# Patient Record
Sex: Male | Born: 1945 | Race: Black or African American | Hispanic: No | Marital: Single | Smoking: Former smoker
Health system: Southern US, Community
[De-identification: ages and names within clinical notes are randomized; demographics above are authoritative.]

## PROBLEM LIST (undated history)

## (undated) DIAGNOSIS — F99 Mental disorder, not otherwise specified: Secondary | ICD-10-CM

## (undated) DIAGNOSIS — G629 Polyneuropathy, unspecified: Secondary | ICD-10-CM

## (undated) DIAGNOSIS — F0390 Unspecified dementia without behavioral disturbance: Secondary | ICD-10-CM

## (undated) DIAGNOSIS — M6282 Rhabdomyolysis: Secondary | ICD-10-CM

## (undated) DIAGNOSIS — M4800 Spinal stenosis, site unspecified: Secondary | ICD-10-CM

## (undated) DIAGNOSIS — C61 Malignant neoplasm of prostate: Secondary | ICD-10-CM

## (undated) DIAGNOSIS — R600 Localized edema: Secondary | ICD-10-CM

## (undated) DIAGNOSIS — I10 Essential (primary) hypertension: Secondary | ICD-10-CM

## (undated) DIAGNOSIS — K529 Noninfective gastroenteritis and colitis, unspecified: Secondary | ICD-10-CM

## (undated) DIAGNOSIS — N390 Urinary tract infection, site not specified: Secondary | ICD-10-CM

## (undated) DIAGNOSIS — K219 Gastro-esophageal reflux disease without esophagitis: Secondary | ICD-10-CM

## (undated) HISTORY — PX: PROSTATECTOMY: SHX69

## (undated) HISTORY — PX: KNEE ARTHROSCOPY: SHX127

## (undated) HISTORY — PX: LAMINECTOMY: SHX219

## (undated) HISTORY — DX: Noninfective gastroenteritis and colitis, unspecified: K52.9

## (undated) HISTORY — DX: Malignant neoplasm of prostate: C61

## (undated) HISTORY — DX: Spinal stenosis, site unspecified: M48.00

---

## 1999-10-07 ENCOUNTER — Encounter: Payer: Self-pay | Admitting: Emergency Medicine

## 1999-10-07 ENCOUNTER — Emergency Department (HOSPITAL_COMMUNITY): Admission: EM | Admit: 1999-10-07 | Discharge: 1999-10-07 | Payer: Self-pay | Admitting: Emergency Medicine

## 2000-05-23 ENCOUNTER — Ambulatory Visit (HOSPITAL_COMMUNITY): Admission: RE | Admit: 2000-05-23 | Discharge: 2000-05-23 | Payer: Self-pay | Admitting: *Deleted

## 2000-05-23 ENCOUNTER — Encounter (INDEPENDENT_AMBULATORY_CARE_PROVIDER_SITE_OTHER): Payer: Self-pay | Admitting: Specialist

## 2003-07-23 ENCOUNTER — Encounter: Admission: RE | Admit: 2003-07-23 | Discharge: 2003-07-23 | Payer: Self-pay | Admitting: Family Medicine

## 2003-07-23 ENCOUNTER — Encounter: Payer: Self-pay | Admitting: Family Medicine

## 2004-09-05 ENCOUNTER — Encounter: Admission: RE | Admit: 2004-09-05 | Discharge: 2004-09-05 | Payer: Self-pay | Admitting: Specialist

## 2004-09-23 ENCOUNTER — Inpatient Hospital Stay (HOSPITAL_COMMUNITY): Admission: RE | Admit: 2004-09-23 | Discharge: 2004-09-26 | Payer: Self-pay | Admitting: Specialist

## 2005-11-16 ENCOUNTER — Encounter: Admission: RE | Admit: 2005-11-16 | Discharge: 2005-11-16 | Payer: Self-pay | Admitting: Specialist

## 2005-12-25 ENCOUNTER — Encounter: Admission: RE | Admit: 2005-12-25 | Discharge: 2005-12-25 | Payer: Self-pay | Admitting: Specialist

## 2006-01-31 ENCOUNTER — Encounter: Admission: RE | Admit: 2006-01-31 | Discharge: 2006-01-31 | Payer: Self-pay | Admitting: Specialist

## 2006-11-21 ENCOUNTER — Encounter
Admission: RE | Admit: 2006-11-21 | Discharge: 2006-11-21 | Payer: Self-pay | Admitting: Physical Medicine and Rehabilitation

## 2007-02-14 ENCOUNTER — Emergency Department (HOSPITAL_COMMUNITY): Admission: EM | Admit: 2007-02-14 | Discharge: 2007-02-14 | Payer: Self-pay | Admitting: Emergency Medicine

## 2007-07-07 ENCOUNTER — Ambulatory Visit: Payer: Self-pay | Admitting: Family Medicine

## 2007-08-08 ENCOUNTER — Ambulatory Visit: Payer: Self-pay | Admitting: Family Medicine

## 2009-05-19 ENCOUNTER — Ambulatory Visit: Payer: Self-pay | Admitting: Family Medicine

## 2011-03-02 LAB — HM COLONOSCOPY: HM Colonoscopy: NEGATIVE

## 2011-03-30 NOTE — Op Note (Signed)
[place]  Patient:    Evan Charles, Evan Charles                       MRN: [id number] Proc. Date: 05/23/00 Attending:  Sharyn Dross., M.D. CC:         Ronnald Nian, M.D.                           Operative Report  PREOPERATIVE DIAGNOSIS:  Blood in stools.  POSTOPERATIVE DIAGNOSIS:  Acute colitis diffusely throughout the entire colon, more intense on the left side.  PROCEDURE: Colonoscopy and biopsies.  MEDICATIONS:  Demerol 80 mg, IV Versed 5 mg IV over 10 minute period of time.   INSTRUMENT:  Olympus video pancolonoscope.  ENDOSCOPIST:  Sharyn Dross., M.D.  INDICATIONS:  This pleasant, 65 year old gentleman was brought in for evaluation because of blood in stools.  He was evaluated in 1996 and was found to have mild colitis that was present.  He was treated with medication and subsequently had not seen him until recently when his symptoms have recurred. He denies any previous symptoms of any rectal bleeding or discomforts that was noted.  It is no associated with diarrhea or abdominal pain.  OBJECTIVE FINDINGS:  GENERAL:  He is a pleasant gentleman in no distress.  VITAL SIGNS:  Stable.  HEENT: Anicteric.  NECK:  Supple.  LUNGS:  Clear.  HEART:  Regular rate and rhythm without heaves, thrills, murmurs, or gallops.  ABDOMEN:  Soft.  No tenderness.  No hepatosplenomegaly. EXTREMITIES:  Within normal limits  PLAN:  Proceed with colonoscopic examination.  INFORMED CONSENT:  The patient was advised of the procedure, indications, and risks involved.  The patient has agreed to have the procedure performed.  A video was reviewed and consent form obtained.  PREOPERATIVE PREPARATION:  The patient was brought to the endoscopy unit and IV sedating medication was started.  Monitors were placed on the patient to monitor the patients vital signs and oxygen saturation.  Nasal oxygen at 2 liters per minute was used, and after adequate  sedation was performed, the procedure was begun.  BOWEL PREP:  The patient was given GoLYTELY and Reglan as a bowel prep.  The patient tolerated the prep well without any complications.  The quality of the prep was excellent.  DESCRIPTION OF PROCEDURE:  The instrument was advanced with patient in left lateral decubitus position to approximately 90 cm proximal to the colon to the cecum.  This was confirmed by palpation, transillumination, and visualization of the ileocecal valve.  There appeared to be no gross abnormalities such as masses, polyps, or stricture lesions appreciated.  The vascular pattern appeared to be well within normal limits throughout the entire colon.  The mucosal pattern showed evidence of acute inflammation, diffuse from the rectosigmoid area all the way through to the cecal region at this time.  The increase in intensity of his process is located on the left side; i.e., rectosigmoid and descending colon that was appreciated at this time.  The less intense was noted from the transverse to the ascending colon region.  Random biopsies were taken throughout the entire area at this time.  There was no increased tortuosity of the colon nor any evidence of stricture formation or inflammatory process that was appreciated at this time. There was no evidence of internal nor external hemorrhoids upon exiting from the area.  The patient tolerated the procedure  well without any difficulties or complications.  TREATMENT: 1. I am going to start the patient with ______ 500 mg q.d. and increase it    q.i.d. at this time. 2. Will recommend a re-evaluation of the patient in six weeks, and depending    upon these responses will determine the course of therapy.  LEVEL OF DIFFICULTY:  1 to 2/5.  RECOMMENDATIONS:  Will continue the use of the standard colonoscope to evaluate the patient. DD:  05/23/00 TD:  05/23/00 Job: [id number]

## 2011-03-30 NOTE — Discharge Summary (Signed)
NAMEERMAN, THUM              ACCOUNT NO.:  [id number]   MEDICAL RECORD NO.:  [id number]          PATIENT TYPE:  INP   LOCATION:  0455                         FACILITY:  [facility]   PHYSICIAN:  Jene Every, M.D.    DATE OF BIRTH:  June 29, 1946   DATE OF ADMISSION:  09/22/2004  DATE OF DISCHARGE:  09/26/2004                                 DISCHARGE SUMMARY   ADMISSION DIAGNOSES:  Spinal stenosis, herniated nucleus pulposis L4-5.   DISCHARGE DIAGNOSES:  Spinal stenosis, herniated nucleus pulposis L4-5  status post bilateral decompression L4-5.   HISTORY:  Evan Charles is a 65 year old gentleman with presentation of  bilateral lower extremity pain with noted neurogenic claudication as well as  neurotension signs.  It was felt at that point the patient would benefit  from lumbar decompression.  The risks and benefits of the surgery were  discussed with the patient by Dr. Shelle Iron and he wished to proceed.   PROCEDURE:  The patient was taken to the OR on September 22, 2004 to undergo  bilateral decompression L4-5 and microdiskectomy.   SURGEON:  Dr. Jene Every.   ASSISTANT:  Georges Lynch. Darrelyn Hillock, M.D.   COMPLICATIONS:  None.   CONSULTATIONS:  PT and OT.   LABORATORY VALUES:  Preoperative hemoglobin and hematocrit showed a  hemoglobin of 14.6, hematocrit 43.6.  This was repeated x1 with a hemoglobin  of 12.3, hematocrit 37.6.  Preoperative urinalysis was negative.  No  preoperative chest x-ray or EKG are available in the chart.   HOSPITAL COURSE:  The patient was admitted, taken to the OR and underwent  the above-stated procedure without significant difficulty.  He was then  transferred to the PACU and then to the orthopedic floor for continued  postoperative care.  Postoperatively, the patient had significant trouble  with pain control.  He was however, very reluctant to get out of bed and  refused therapy several times.  The patient did note some improvement in  paresthesias in  the lower extremities.  The pain medication was changed.  The patient was converted to p.o. medications.  We did obtain a lumbar  corset for him, arranged home health therapy.  The patient was ambulating  significantly better.  It is felt at this point he could be discharged home.   DISCHARGE INSTRUCTIONS:  The patient was given Percocet 1-2 p.o. q.4-6h.  p.r.n. pain.  OxyContin 20 mg one p.o. b.i.d.  Robaxin one every six hours  p.r.n. muscle spasm.  Activity:  Back precautions.  He is to wear his lumbar  corset as needed.  Walk as tolerated.  Change his dressing daily.  It is  okay for him to shower.  He will also use his walker when up and about.  He  is to follow back up with Dr. Shelle Iron in approximately 7-10 days.   CONDITION ON DISCHARGE:  Stable.   DIET:  As tolerated.   FINAL DIAGNOSIS:  Status post bilateral decompression L4-5.     Chri   CS/MEDQ  D:  11/09/2004  T:  11/09/2004  Job:  [id number]

## 2011-03-30 NOTE — Op Note (Signed)
Evan Charles, Evan Charles              ACCOUNT NO.:  [id number]   MEDICAL RECORD NO.:  [id number]          PATIENT TYPE:  AMB   LOCATION:  DAY                          FACILITY:  [facility]   PHYSICIAN:  Jene Every, M.D.    DATE OF BIRTH:  May 13, 1946   DATE OF PROCEDURE:  DATE OF DISCHARGE:                                 OPERATIVE REPORT   PREOPERATIVE DIAGNOSIS:  Herniated nucleus pulposus, spinal stenosis, L4-5.   POSTOPERATIVE DIAGNOSIS:  Herniated nucleus pulposus, spinal stenosis, L4-5.   PROCEDURE PERFORMED:  Bilateral hemilaminotomy, lateral recess  decompression, partial medial hemifacetectomy, microdiskectomy.   SURGEON:  Jene Every, M.D.   ASSISTANT:  Georges Lynch. Gioffre, M.D.   BRIEF HISTORY/INDICATIONS:  A 65 year old male has had lower extremity  radiculopathy and neurogenic claudication secondary to spinal stenosis  secondary to central HNP at 4-5 facet hypertrophy and general listhesis of 4-  5, but no instability on flexion and extension after persistent symptoms,  predominantly in the right lower extremity, somewhat relieved with flexion,  no change in bowel or bladder function, near-complete block at 4-5 on  myelogram.  Operative intervention was indicated for the decompression of 4-  5, pain relief, and to prevent neurological progression.  He had a  __________ EHL weakness on the right, in comparison to the left.  I also  discussed operative fusion.  He had some mild degenerative changes at the  adjacent levels.  Myelogram indicating possible contact of the L3 nerve  root.  He had no L3 nerve root discomfort on the left.   We discussed decompression, decompression plus fusion in detail in the  office in the previous visit.  Patient did not want to proceed with any  surgical concomitant fusion, citing multiple reasons for the retained  hardware; therefore, discussed decompression bilateral at 4-5 but indicating  may require effusion in the future.  May experience  recurrent disk  herniation, damage to vascular structures, CSF leak, __________, adjacent  segment disease, anesthetic complications, DVT, PE.  He understands that  this will only address leg pain and not back pain.  He wishes to proceed  with the procedure.  He was counseled, and this was discussed again  preoperatively of the risks and benefits as well as decompression versus  fusion, again, he declined the fusion and also had indicated decompression  and instrumentation and fusion had long-term results.  He understood, but  again, declined the concomitant effusion.   TECHNIQUE:  With the patient in supine position after adequate induction of  general anesthesia and 1 gm of Kefzol, patient was placed prone on the  [place] frame.  All bony prominences were well-padded.  An 18-gauge needle  was utilized to localize the 4-5 interspace, confirmed with x-ray.  An  incision was made from the spinous process of 4 to below the spinous process  of 5.  Subcutaneous tissue was dissected with electrocautery and utilized to  achieve hemostasis.  Dorsal lumbar fascia was identified and divided  on a  line of the interspinous ligament at 4-5.  I felt preserving this__________  ligament, given the central disk  herniation and bilateral hemilaminectomy  would be preferable in terms of reducing the disease of further listhesis.  Turning our attention first towards the right side, hemilaminectomy on the  caudad edge of 4 was performed with a 2-3 mm Kerrison.  The ligamentum  flavum was detached from the cephalad edge of 5 with a straight curette and  a hemilaminectomy.  The cephalad edge of 5 was performed with 2 and then a 3  mm Kerrison.  Foraminotomy of L5 was performed.  We decompressed the lateral  recess, utilizing a decompression of the lateral aspect of the facet.  Ligamentum flavum then was removed from the interspace.  Palpated the large  disk herniation noted on through the right.  At this point  in time, after  operating microscope had been draped in the surgical field, we moved to the  contralateral side.  We performed hemilaminectomy of the caudad edge of 4  and the cephalad edge of 5, detaching the ligamentum flavum.  Removed the  ligamentum flavum from the interspace and performed a foraminotomy of L5.  After the ligamentum was removed from the interspace, we decompressed the  lateral recess again, performing a partial medial hemifacetectomy to gain  lateral access.  No traction was placed upon the thecal sac.  After removing  the ligamentum flavum bilaterally, we performed hemilaminectomy back on the  right, and the disk space was noted to be ruptured.  A copious portion of  disk material was removed from the disk space on the right.  I removed the  contralateral side and performed an annulotomy on the left and again  performed the diskectomy with a straight pituitary.  This was further  mobilized with an Epstein and a nerve hook, again utilizing minimal-to-no  retraction but protecting the neural elements with a Penfield 4.  Between  each successive use of the pituitary, we released the neural elements.  After performing the diskectomy of herniated material on the left, again  returned to the right and were able to mobilize further extruded fragments.  This was a counter-combination of the nerve hook.  An hockey-stick probe and  an Epstein, performing a full diskectomy of herniated material.  Following  this, we had utilized bipolar electrocautery to achieve hemostasis of  epidural venous plexus.  We used bone wax on the facets.  Then reinspected  both sides.  A hockey-stick probe placed at the foramen of 4-5 and found to  be widely patent.  We had performed foraminotomies of L4 of the ligamentum  flavum.  Hockey-stick probe placed at 3-5 and found to be patent.  The disk space was copiously irrigated with antibiotic irrigation and inspected but  no CSF leakage or active  bleeding.  The 5 root is freely mobile.  There is  no pressure on the thecal sac.  Placed a thrombin-soaked Gelfoam on the  hemilaminectomy defects.  Inspected the paraspinous musculature.  Electrocautery was utilized to achieve hemostasis.  No active bleeding.  Placed a Hemovac and brought it out through a lateral stab wound in the  skin.  Then repaired the dorsal lumbar fascia with #1 Vicryl interrupted  figure-of-eight sutures.  The subcutaneous tissue was reapproximated with 2-  0 Vicryl simple sutures.  The skin was reapproximated with staples.  The  wound was dressed sterilely and placed supine on the hospital bed and  extubated without difficulty and transported to the recovery room in  satisfactory condition.  Patient tolerated the procedure well.  No  complications.  Blood loss was 25 cc.     Trey Paula   JB/MEDQ  D:  09/22/2004  T:  09/22/2004  Job:  [id number]

## 2011-03-30 NOTE — H&P (Signed)
Evan Charles, Evan Charles              ACCOUNT NO.:  [id number]   MEDICAL RECORD NO.:  [id number]          PATIENT TYPE:  INP   LOCATION:  0455                         FACILITY:  [facility]   PHYSICIAN:  Jene Every, M.D.    DATE OF BIRTH:  August 23, 1946   DATE OF ADMISSION:  09/22/2004  DATE OF DISCHARGE:  09/26/2004                                HISTORY & PHYSICAL   CHIEF COMPLAINT:  Lower extremity pain.   HISTORY:  Evan Charles is a 65 year old gentleman with onset of lower  extremity pain and neurogenic claudication secondary to spinal stenosis  secondary to central HNP at L4-5 with facet hypertrophy.  The patient did  present with EHL weakness on the right in comparison to the left.  Myelogram  did indicate possible contact of the L3 nerve root.  It was felt at this  point, the patient would benefit from a lumbar decompression and  microdiskectomy at the L4-5 level.  The risks and benefits of the surgery  were discussed with the patient in detail, and he wished to proceed.   MEDICAL HISTORY:  Noncontributory.   CURRENT MEDICATIONS:  Darvocet, iron, and Advil.   ALLERGIES:  TOMATOES which cause a rash.   SOCIAL HISTORY:  The patient does not drink alcohol.  He smoked 1/2 pack of  cigarettes per day, quit several months ago.   REVIEW OF SYSTEMS:  GENERAL:  The patient denies any fever, chills, night  sweats, or bleeding tendencies.  CNS:  No blurred, double vision, seizure,  headache, or paralysis.  RESPIRATORY:  No shortness of breath, productive  cough, or hemoptysis.  CARDIOVASCULAR:  No chest pain, angina, orthopnea.  GU:  No dysuria or hematuria at discharge.  GI:  No nausea, vomiting,  diarrhea, constipation, or melenic stools.  MUSCULOSKELETAL:  Pertinents in  HPI.   PHYSICAL EXAMINATION:  This comes from the admission health history sheet.  GENERAL:  This is a well-developed, well-nourished 65 year old gentleman in  mild distress.  HEENT:  Atraumatic, normocephalic.  Pupils  equal, round, reactive to light.  NECK:  Supple, no lymphadenopathy.  CHEST:  Clear to auscultation bilaterally.  No rhonchi, wheezes, or rales.  BREASTS/GU:  Not examined.  Not pertinent to HPI.  HEART:  Regular rate and rhythm without murmurs, gallops, or rubs.  ABDOMEN:  Soft, nontender, nondistended.  Bowel sounds x 4.  EXTREMITIES:  The patient has positive straight leg raise bilaterally,  slight weakness in EHL function, 2+ __________ pulses, decreased sensation  along the L5 dermatome.   IMPRESSION:  Spinal stenosis with large herniated nucleated pulposus at L4-  5.   PLAN:  The patient will be admitted to [facility] to undergo  lumbar decompression and microdiskectomy at L4-5 level.     Chri   CS/MEDQ  D:  11/09/2004  T:  11/09/2004  Job:  [id number]

## 2011-05-14 ENCOUNTER — Encounter: Payer: Self-pay | Admitting: Family Medicine

## 2013-08-14 ENCOUNTER — Emergency Department (HOSPITAL_COMMUNITY)
Admission: EM | Admit: 2013-08-14 | Discharge: 2013-08-14 | Disposition: A | Discharge status: Home / Self Care | Payer: Medicare Other | Attending: Emergency Medicine | Admitting: Emergency Medicine

## 2013-08-14 ENCOUNTER — Encounter (HOSPITAL_COMMUNITY): Payer: Self-pay

## 2013-08-14 ENCOUNTER — Emergency Department (HOSPITAL_COMMUNITY): Payer: Medicare Other

## 2013-08-14 DIAGNOSIS — Z87891 Personal history of nicotine dependence: Secondary | ICD-10-CM | POA: Insufficient documentation

## 2013-08-14 DIAGNOSIS — Y9389 Activity, other specified: Secondary | ICD-10-CM | POA: Insufficient documentation

## 2013-08-14 DIAGNOSIS — Z8739 Personal history of other diseases of the musculoskeletal system and connective tissue: Secondary | ICD-10-CM | POA: Insufficient documentation

## 2013-08-14 DIAGNOSIS — R209 Unspecified disturbances of skin sensation: Secondary | ICD-10-CM | POA: Insufficient documentation

## 2013-08-14 DIAGNOSIS — Z8719 Personal history of other diseases of the digestive system: Secondary | ICD-10-CM | POA: Insufficient documentation

## 2013-08-14 DIAGNOSIS — Y92009 Unspecified place in unspecified non-institutional (private) residence as the place of occurrence of the external cause: Secondary | ICD-10-CM | POA: Insufficient documentation

## 2013-08-14 DIAGNOSIS — S0180XA Unspecified open wound of other part of head, initial encounter: Secondary | ICD-10-CM | POA: Insufficient documentation

## 2013-08-14 DIAGNOSIS — W06XXXA Fall from bed, initial encounter: Secondary | ICD-10-CM | POA: Insufficient documentation

## 2013-08-14 DIAGNOSIS — S0181XA Laceration without foreign body of other part of head, initial encounter: Secondary | ICD-10-CM

## 2013-08-14 DIAGNOSIS — Z8546 Personal history of malignant neoplasm of prostate: Secondary | ICD-10-CM | POA: Insufficient documentation

## 2013-08-14 MED ORDER — TETANUS-DIPHTH-ACELL PERTUSSIS 5-2.5-18.5 LF-MCG/0.5 IM SUSP
0.5000 mL | Freq: Once | INTRAMUSCULAR | Status: AC
  Administered 2013-08-14: 0.5 mL via INTRAMUSCULAR
  Filled 2013-08-14: qty 0.5

## 2013-08-14 MED ORDER — CLINDAMYCIN HCL 300 MG PO CAPS
300.0000 mg | ORAL_CAPSULE | Freq: Three times a day (TID) | ORAL | Status: DC
  Administered 2013-08-14: 300 mg via ORAL
  Filled 2013-08-14 (×3): qty 1

## 2013-08-14 MED ORDER — CLINDAMYCIN HCL 150 MG PO CAPS: 450.0000 mg | ORAL_CAPSULE | Freq: Three times a day (TID) | ORAL | Status: DC

## 2013-08-14 NOTE — ED Provider Notes (Signed)
CSN: [id number]     Arrival date & time 08/14/13  1141 History   First MD Initiated Contact with Patient 08/14/13 1149     Chief Complaint  Patient presents with  . Facial Laceration   (Consider location/radiation/quality/duration/timing/severity/associated sxs/prior Treatment) The history is provided by the patient. No language interpreter was used.  Evan Charles is a 67 y/o M with PMHx of prostate cancer, colitis, back pain presenting to the ED with facial laceration that occurred approximately 2:00-3:00 AM this morning. Patient reported that while he was sleeping he fell out of bed and hit is head on a metal rail. Patient reports that he noticed a laceration to the face, reported that he applied compressions 8 and bleeding controlled. Patient reported that he noticed left-sided numbness to the upper lip, cheek, nose region. Reported that there is a constant throbbing sensation to the area of the laceration with radiation to the left side the back. Reported that he took oxycodone, reports he has medication at home, reported that there is pain relief. Patient denied loss of consciousness, blood thinners, neck stiffness, blurred vision, sudden loss of vision, spots in vision, slurred speech, weakness, chest pain, shortness of breath, difficulty breathing, fainting. Patient reports he has not had a tetanus shot within 10 years. PCP none  Past Medical History  Diagnosis Date  . Back pain   . Colitis   . Prostate cancer    Past Surgical History  Procedure Laterality Date  . Prostatectomy    . Laminectomy     Family History  Problem Relation Age of Onset  . Cancer Brother   . Cancer Maternal Grandmother    History  Substance Use Topics  . Smoking status: Former Smoker    Types: Cigarettes  . Smokeless tobacco: Not on file  . Alcohol Use: No    Review of Systems  Eyes: Negative for visual disturbance.  Respiratory: Negative for chest tightness and shortness of breath.     Cardiovascular: Negative for chest pain.  Skin: Positive for wound (left side of the face).  Neurological: Positive for numbness. Negative for dizziness, syncope and weakness.  All other systems reviewed and are negative.    Allergies  Other  Home Medications   Current Outpatient Rx  Name  Route  Sig  Dispense  Refill  . docusate sodium (COLACE) 50 MG capsule   Oral   Take by mouth 3 (three) times daily as needed for constipation.         Marland Kitchen oxycodone (ROXICODONE) 30 MG immediate release tablet   Oral   Take 30 mg by mouth every 4 (four) hours as needed for pain.          . clindamycin (CLEOCIN) 150 MG capsule   Oral   Take 3 capsules (450 mg total) by mouth 3 (three) times daily.   90 capsule   0    BP 150/98  Pulse 104  Temp(Src) 97.8 F (36.6 C) (Oral)  Resp 18  Ht 5\' 11"  (1.803 m)  Wt 189 lb (85.73 kg)  BMI 26.37 kg/m2  SpO2 99% Physical Exam  Nursing note and vitals reviewed. Constitutional: He is oriented to person, place, and time. He appears well-developed and well-nourished. No distress.  HENT:  Head: Normocephalic.  Nose:    Mouth/Throat: Oropharynx is clear and moist. No oropharyngeal exudate.  Approximately 5.5 cm gaping facial laceration to the left side of the face, affecting the vermilion border. Laceration affects left nasolabial fold and left maxillary  region. No muscles appear to be involved. Negative involvement of buccal mucosa. Negative through-and-through laceration. bleeding controlled.   Negative damage to dentition identified.  Eyes: Conjunctivae and EOM are normal. Pupils are equal, round, and reactive to light. Right eye exhibits no discharge. Left eye exhibits no discharge.  Neck: Normal range of motion. Neck supple.  Cardiovascular: Normal rate, regular rhythm and normal heart sounds.  Exam reveals no friction rub.   No murmur heard. Pulmonary/Chest: Effort normal and breath sounds normal. No respiratory distress. He has no  wheezes. He has no rales.  Neurological: He is alert and oriented to person, place, and time. No cranial nerve deficit. He exhibits normal muscle tone. Coordination normal.  Cranial nerves III-XII grossly intact Sensation intact to the face with differentiation to sharp and dull touch   Skin: Skin is warm and dry. No rash noted. He is not diaphoretic. No erythema.  Please see HENT portion of exam  Psychiatric: He has a normal mood and affect. His behavior is normal. Thought content normal.    ED Course  Procedures (including critical care time)  12:44PM Dr. Blinda Leatherwood saw and assessed patient, recommended that Maxillofacial be consulted regarding closure of the laceration since it is complex.  12:52 PM Spoke with Dr. Jearld Fenton, maxillofacial surgery, discussed case. Dr. Jearld Fenton to come see patient.  1:31 PM Dr. Jearld Fenton, Maxillofacial surgeon, in room with patient.  Laceration closed by Dr. Jearld Fenton, maxillofacial surgeon. Dr. Jearld Fenton cleared patient for discharge. Recommended clindamycin and followup within one week for reevaluation as outpatient.   Labs Review Labs Reviewed - No data to display Imaging Review Ct Head Wo Contrast  08/14/2013   CLINICAL DATA:  Fall from bed. Trauma to face.  EXAM: CT HEAD WITHOUT CONTRAST  TECHNIQUE: Contiguous axial images were obtained from the base of the skull through the vertex without intravenous contrast.  COMPARISON:  None.  FINDINGS: A prominent soft tissue laceration is evident over the left maxilla. There is no underlying fracture. The nasal bones are intact. The globes orbits are intact. The paranasal sinuses are unremarkable.  No acute cortical infarct, hemorrhage, or mass lesion is present. The ventricles are of normal size. No significant extra-axial fluid collections are present.  IMPRESSION: 1. Prominent soft tissue laceration over the left maxilla. 2. No underlying fracture. 3. Normal CT appearance of the brain for age.   Electronically Signed   By: Gennette Pac M.D.   On: 08/14/2013 13:40    MDM   1. Facial laceration, initial encounter    Patient presenting to emergency department with laceration to the left of the face that occurred this morning at approximately 2:00-3:00 AM. Denied dizziness, loss of sensation, blood thinners, loss of consciousness. Alert and oriented. Approximately 5.5 cm gaping laceration to the left side of her face, complex, affecting the vermilion border, left nasolabial fold and left axillary region. No muscles appear to be involved. Cranial nerves III through XII grossly intact. Sensation intact to face with differentiation to sharp and dull touch. Negative slurring of speech. Negative facial drooping identified. Negative through and through laceration. Bleeding controlled. CT negative for intracranial bleeds. He has negative underlying fractures identified. Tetanus shot given to the patient in ED setting. Maxillofacial surgeon, Dr. Jearld Fenton, closed wound. Dr. Jearld Fenton cleared patient for discharge. Patient stable, afebrile. Discharged patient with clindamycin. Discussed with patient to followup with Dr. Jearld Fenton within one week. Discussed with patient proper wound cleaning. Discussed with patient to monitor symptoms and if symptoms are to worsen  or change report back to emergency department gastric return structures given. Educated patient on signs and symptoms of wound infection. Discussed with patient that if he is to have a laceration he is to come to emergency department immediately, educated patient that waiting with a laceration can lead to further infection. Patient understood. Patient agreed to plan of care, understood, all questions answered.    Raymon Mutton, PA-C 08/16/13 [phone]

## 2013-08-14 NOTE — ED Notes (Signed)
Pt states he was dreaming and fell out of bed last night and when rising hit his face on the bed rail. Presents with large laceration to left side of face above the upper lip and beside the nose.

## 2013-08-14 NOTE — Consult Note (Signed)
Reason for Consult: Facial laceration Referring Physician: Emergency room  Evan Charles is an 67 y.o. male.  HPI: 67 year old who fell yesterday and hit his left face with a laceration sustained. He failed to come in as he thought he could take care of it himself. He has had no drainage. He has no nasal obstruction. No dental injury. His CT scan did not show any evidence of fractures.  Past Medical History  Diagnosis Date  . Back pain   . Colitis   . Prostate cancer     Past Surgical History  Procedure Laterality Date  . Prostatectomy    . Laminectomy      Family History  Problem Relation Age of Onset  . Cancer Brother   . Cancer Maternal Grandmother     Social History:  reports that he has quit smoking. His smoking use included Cigarettes. He smoked 0.00 packs per day. He does not have any smokeless tobacco history on file. He reports that he does not drink alcohol or use illicit drugs.  Allergies:  Allergies  Allergen Reactions  . Other Rash    Tomatoes causes a rash    Medications: I have reviewed the patient's current medications.  No results found for this or any previous visit (from the past 48 hour(s)).  Ct Head Wo Contrast  08/14/2013   CLINICAL DATA:  Fall from bed. Trauma to face.  EXAM: CT HEAD WITHOUT CONTRAST  TECHNIQUE: Contiguous axial images were obtained from the base of the skull through the vertex without intravenous contrast.  COMPARISON:  None.  FINDINGS: A prominent soft tissue laceration is evident over the left maxilla. There is no underlying fracture. The nasal bones are intact. The globes orbits are intact. The paranasal sinuses are unremarkable.  No acute cortical infarct, hemorrhage, or mass lesion is present. The ventricles are of normal size. No significant extra-axial fluid collections are present.  IMPRESSION: 1. Prominent soft tissue laceration over the left maxilla. 2. No underlying fracture. 3. Normal CT appearance of the brain for age.    Electronically Signed   By: Gennette Pac M.D.   On: 08/14/2013 13:40    Review of Systems  Constitutional: Negative.   HENT: Negative.   Eyes: Negative.   Respiratory: Negative.   Cardiovascular: Negative.   Skin: Negative.    Blood pressure 150/98, pulse 104, temperature 97.8 F (36.6 C), temperature source Oral, resp. rate 18, height 5\' 11"  (1.803 m), weight 85.73 kg (189 lb), SpO2 99.00%. Physical Exam  Constitutional: He appears well-developed.  HENT:  Awake and alert. The nose internally is clear. There is a 4 cm laceration extending from his upper philtrum to the left lateral nasolabial fold. It is extending just below the nasal sill and deep. It does not appear there is a mucosal disruption. The wound was injected with 1% lidocaine with 1 100,000 epinephrine. It was irrigated with Betadine and then saline. It was closed with interrupted 4-0 chromic to approximate the deep structures and muscle. The skin was closed with interrupted 4-0 nylon and a running lateral 4-0 nylon. He tolerated this procedure well. There was good hemostasis.  Eyes: Conjunctivae and EOM are normal. Pupils are equal, round, and reactive to light.  Neck: Normal range of motion.    Assessment/Plan: Facial laceration-he understands that there is a risk of infection closing this almost 24 hours later. In lieu of the degree of laceration and extension it was worth closing despite this risk. I will start him on  clindamycin he'll take 3 times a day. He will followup in one week. Wound instructions were given.  Suzanna Obey 08/14/2013, 2:00 PM

## 2013-08-17 NOTE — ED Provider Notes (Signed)
Medical screening examination/treatment/procedure(s) were conducted as a shared visit with non-physician practitioner(s) and myself.  I personally evaluated the patient during the encounter.  Patient presents to the ER for evaluation of facial laceration. Patient has an extensive, complex laceration involving Vermillion border, nasolabial fold and malar cheek area. Complexity of the wound required consultation by facial trauma.  Gilda Crease, MD 08/17/13 1324

## 2013-12-02 ENCOUNTER — Ambulatory Visit: Payer: No Typology Code available for payment source | Attending: Physician Assistant | Admitting: Physical Therapy

## 2013-12-02 DIAGNOSIS — M129 Arthropathy, unspecified: Secondary | ICD-10-CM | POA: Insufficient documentation

## 2013-12-02 DIAGNOSIS — IMO0001 Reserved for inherently not codable concepts without codable children: Secondary | ICD-10-CM | POA: Diagnosis not present

## 2013-12-02 DIAGNOSIS — Z96659 Presence of unspecified artificial knee joint: Secondary | ICD-10-CM | POA: Insufficient documentation

## 2013-12-02 DIAGNOSIS — M25569 Pain in unspecified knee: Secondary | ICD-10-CM | POA: Diagnosis not present

## 2013-12-02 DIAGNOSIS — M25669 Stiffness of unspecified knee, not elsewhere classified: Secondary | ICD-10-CM | POA: Insufficient documentation

## 2013-12-02 DIAGNOSIS — M549 Dorsalgia, unspecified: Secondary | ICD-10-CM | POA: Diagnosis not present

## 2013-12-02 DIAGNOSIS — R609 Edema, unspecified: Secondary | ICD-10-CM | POA: Insufficient documentation

## 2013-12-04 ENCOUNTER — Ambulatory Visit: Payer: No Typology Code available for payment source | Admitting: Physical Therapy

## 2013-12-04 DIAGNOSIS — IMO0001 Reserved for inherently not codable concepts without codable children: Secondary | ICD-10-CM | POA: Diagnosis not present

## 2013-12-07 ENCOUNTER — Ambulatory Visit: Payer: No Typology Code available for payment source | Admitting: Physical Therapy

## 2013-12-07 DIAGNOSIS — IMO0001 Reserved for inherently not codable concepts without codable children: Secondary | ICD-10-CM | POA: Diagnosis not present

## 2013-12-10 ENCOUNTER — Ambulatory Visit: Payer: No Typology Code available for payment source | Admitting: Physical Therapy

## 2013-12-10 DIAGNOSIS — IMO0001 Reserved for inherently not codable concepts without codable children: Secondary | ICD-10-CM | POA: Diagnosis not present

## 2013-12-15 ENCOUNTER — Ambulatory Visit: Payer: Medicare Other | Attending: Physician Assistant | Admitting: Physical Therapy

## 2013-12-15 DIAGNOSIS — M129 Arthropathy, unspecified: Secondary | ICD-10-CM | POA: Insufficient documentation

## 2013-12-15 DIAGNOSIS — IMO0001 Reserved for inherently not codable concepts without codable children: Secondary | ICD-10-CM | POA: Insufficient documentation

## 2013-12-15 DIAGNOSIS — M25669 Stiffness of unspecified knee, not elsewhere classified: Secondary | ICD-10-CM | POA: Insufficient documentation

## 2013-12-15 DIAGNOSIS — R609 Edema, unspecified: Secondary | ICD-10-CM | POA: Insufficient documentation

## 2013-12-15 DIAGNOSIS — Z96659 Presence of unspecified artificial knee joint: Secondary | ICD-10-CM | POA: Insufficient documentation

## 2013-12-15 DIAGNOSIS — M25569 Pain in unspecified knee: Secondary | ICD-10-CM | POA: Insufficient documentation

## 2013-12-15 DIAGNOSIS — M549 Dorsalgia, unspecified: Secondary | ICD-10-CM | POA: Insufficient documentation

## 2013-12-17 ENCOUNTER — Ambulatory Visit: Payer: Medicare Other | Admitting: Physical Therapy

## 2013-12-17 DIAGNOSIS — M549 Dorsalgia, unspecified: Secondary | ICD-10-CM | POA: Diagnosis not present

## 2013-12-17 DIAGNOSIS — M129 Arthropathy, unspecified: Secondary | ICD-10-CM | POA: Diagnosis not present

## 2013-12-17 DIAGNOSIS — Z96659 Presence of unspecified artificial knee joint: Secondary | ICD-10-CM | POA: Diagnosis not present

## 2013-12-17 DIAGNOSIS — IMO0001 Reserved for inherently not codable concepts without codable children: Secondary | ICD-10-CM | POA: Diagnosis not present

## 2013-12-17 DIAGNOSIS — R609 Edema, unspecified: Secondary | ICD-10-CM | POA: Diagnosis not present

## 2013-12-17 DIAGNOSIS — M25569 Pain in unspecified knee: Secondary | ICD-10-CM | POA: Diagnosis not present

## 2013-12-17 DIAGNOSIS — M25669 Stiffness of unspecified knee, not elsewhere classified: Secondary | ICD-10-CM | POA: Diagnosis not present

## 2013-12-22 ENCOUNTER — Ambulatory Visit: Payer: Medicare Other | Admitting: Physical Therapy

## 2013-12-22 DIAGNOSIS — IMO0001 Reserved for inherently not codable concepts without codable children: Secondary | ICD-10-CM | POA: Diagnosis not present

## 2013-12-24 ENCOUNTER — Ambulatory Visit: Payer: Medicare Other | Admitting: Physical Therapy

## 2013-12-24 DIAGNOSIS — IMO0001 Reserved for inherently not codable concepts without codable children: Secondary | ICD-10-CM | POA: Diagnosis not present

## 2013-12-28 ENCOUNTER — Ambulatory Visit: Payer: Medicare Other | Admitting: Physical Therapy

## 2013-12-30 ENCOUNTER — Ambulatory Visit: Payer: Medicare Other | Admitting: Physical Therapy

## 2013-12-30 DIAGNOSIS — IMO0001 Reserved for inherently not codable concepts without codable children: Secondary | ICD-10-CM | POA: Diagnosis not present

## 2014-01-05 ENCOUNTER — Ambulatory Visit: Payer: Medicare Other | Admitting: Physical Therapy

## 2014-01-05 DIAGNOSIS — IMO0001 Reserved for inherently not codable concepts without codable children: Secondary | ICD-10-CM | POA: Diagnosis not present

## 2014-01-13 ENCOUNTER — Ambulatory Visit: Payer: Medicare Other | Attending: Physician Assistant | Admitting: Physical Therapy

## 2014-01-13 DIAGNOSIS — M25569 Pain in unspecified knee: Secondary | ICD-10-CM | POA: Diagnosis not present

## 2014-01-13 DIAGNOSIS — IMO0001 Reserved for inherently not codable concepts without codable children: Secondary | ICD-10-CM | POA: Diagnosis present

## 2014-01-13 DIAGNOSIS — Z96659 Presence of unspecified artificial knee joint: Secondary | ICD-10-CM | POA: Diagnosis not present

## 2014-01-13 DIAGNOSIS — M549 Dorsalgia, unspecified: Secondary | ICD-10-CM | POA: Insufficient documentation

## 2014-01-13 DIAGNOSIS — R609 Edema, unspecified: Secondary | ICD-10-CM | POA: Diagnosis not present

## 2014-01-13 DIAGNOSIS — M25669 Stiffness of unspecified knee, not elsewhere classified: Secondary | ICD-10-CM | POA: Insufficient documentation

## 2014-01-13 DIAGNOSIS — M129 Arthropathy, unspecified: Secondary | ICD-10-CM | POA: Insufficient documentation

## 2014-01-27 ENCOUNTER — Ambulatory Visit: Payer: Medicare Other | Admitting: Physical Therapy

## 2014-01-27 DIAGNOSIS — IMO0001 Reserved for inherently not codable concepts without codable children: Secondary | ICD-10-CM | POA: Diagnosis not present

## 2014-09-29 ENCOUNTER — Emergency Department (HOSPITAL_COMMUNITY): Payer: Medicare Other

## 2014-09-29 ENCOUNTER — Encounter (HOSPITAL_COMMUNITY): Payer: Self-pay | Admitting: *Deleted

## 2014-09-29 ENCOUNTER — Emergency Department (HOSPITAL_COMMUNITY)
Admission: EM | Admit: 2014-09-29 | Discharge: 2014-09-30 | Disposition: A | Discharge status: Home / Self Care | Payer: Medicare Other | Attending: Emergency Medicine | Admitting: Emergency Medicine

## 2014-09-29 DIAGNOSIS — Z8719 Personal history of other diseases of the digestive system: Secondary | ICD-10-CM | POA: Insufficient documentation

## 2014-09-29 DIAGNOSIS — R0602 Shortness of breath: Secondary | ICD-10-CM | POA: Insufficient documentation

## 2014-09-29 DIAGNOSIS — Z8546 Personal history of malignant neoplasm of prostate: Secondary | ICD-10-CM | POA: Diagnosis not present

## 2014-09-29 DIAGNOSIS — Z87891 Personal history of nicotine dependence: Secondary | ICD-10-CM | POA: Insufficient documentation

## 2014-09-29 DIAGNOSIS — R443 Hallucinations, unspecified: Secondary | ICD-10-CM | POA: Diagnosis present

## 2014-09-29 DIAGNOSIS — G8929 Other chronic pain: Secondary | ICD-10-CM | POA: Diagnosis not present

## 2014-09-29 DIAGNOSIS — R441 Visual hallucinations: Secondary | ICD-10-CM | POA: Diagnosis not present

## 2014-09-29 DIAGNOSIS — R413 Other amnesia: Secondary | ICD-10-CM | POA: Insufficient documentation

## 2014-09-29 LAB — COMPREHENSIVE METABOLIC PANEL
ALT: 14 U/L (ref 0–53)
AST: 26 U/L (ref 0–37)
Albumin: 4.1 g/dL (ref 3.5–5.2)
Alkaline Phosphatase: 67 U/L (ref 39–117)
Anion gap: 12 (ref 5–15)
BUN: 18 mg/dL (ref 6–23)
CO2: 27 mEq/L (ref 19–32)
Calcium: 9.6 mg/dL (ref 8.4–10.5)
Chloride: 106 mEq/L (ref 96–112)
Creatinine, Ser: 0.92 mg/dL (ref 0.50–1.35)
GFR calc Af Amer: >90 mL/min (ref >90)
GFR calc non Af Amer: 85 mL/min — ABNORMAL LOW (ref >90)
Glucose, Bld: 111 mg/dL — ABNORMAL HIGH (ref 70–99)
Potassium: 4 mEq/L (ref 3.7–5.3)
Sodium: 145 mEq/L (ref 137–147)
Total Bilirubin: 0.5 mg/dL (ref 0.3–1.2)
Total Protein: 7.5 g/dL (ref 6.0–8.3)

## 2014-09-29 LAB — CBC
HCT: 41.4 % (ref 39.0–52.0)
Hemoglobin: 13.4 g/dL (ref 13.0–17.0)
MCH: 27.8 pg (ref 26.0–34.0)
MCHC: 32.4 g/dL (ref 30.0–36.0)
MCV: 85.9 fL (ref 78.0–100.0)
Platelets: 320 10*3/uL (ref 150–400)
RBC: 4.82 MIL/uL (ref 4.22–5.81)
RDW: 13.5 % (ref 11.5–15.5)
WBC: 5.5 10*3/uL (ref 4.0–10.5)

## 2014-09-29 LAB — ETHANOL: Alcohol, Ethyl (B): <11 mg/dL (ref 0–11)

## 2014-09-29 LAB — PRO B NATRIURETIC PEPTIDE: Pro B Natriuretic peptide (BNP): 67.5 pg/mL (ref 0–125)

## 2014-09-29 LAB — RAPID URINE DRUG SCREEN, HOSP PERFORMED
Amphetamines: NOT DETECTED
Barbiturates: NOT DETECTED
Benzodiazepines: NOT DETECTED
Cocaine: NOT DETECTED
Opiates: NOT DETECTED
Tetrahydrocannabinol: NOT DETECTED

## 2014-09-29 LAB — ACETAMINOPHEN LEVEL: Acetaminophen (Tylenol), Serum: <15 ug/mL (ref 10–30)

## 2014-09-29 LAB — SALICYLATE LEVEL: Salicylate Lvl: <2 mg/dL — ABNORMAL LOW (ref 2.8–20.0)

## 2014-09-29 LAB — D-DIMER, QUANTITATIVE: D-Dimer, Quant: 0.6 ug/mL-FEU — ABNORMAL HIGH (ref 0.00–0.48)

## 2014-09-29 MED ORDER — IOHEXOL 350 MG/ML SOLN
100.0000 mL | Freq: Once | INTRAVENOUS | Status: AC | PRN
  Administered 2014-09-29: 100 mL via INTRAVENOUS

## 2014-09-29 NOTE — ED Notes (Addendum)
Pt states two weeks ago he took extra Oxycodone for this ankle pain and had shortness of breath. Also, states he thinks his daughter is upset with him. Denies having audiatory or visual hallucinations. Patient is alert, oriented to place, person, and month but does know what day or date it is. Also, he can not recall what events lead up to him coming to the hospital.

## 2014-09-29 NOTE — ED Notes (Signed)
Notified CT that patient has an IV in place for scans.

## 2014-09-29 NOTE — ED Notes (Signed)
Family called EMS to report that they had not spoken to pt in 3 days and were concerned; EMS reports that pt has torn up his mess; pt has cut open furniture and reports people are in his house; EMS or [employer] were able to see the people; pt states that it began 3 days ago when he took an extra Oxy for his knee pain; pt states that he took 2 tabs in 3 hrs; pt states that he hasn't taken any extra meds since but is still seeing people; pt denies SI / HI

## 2014-09-29 NOTE — ED Notes (Signed)
Pt transported to CT ?

## 2014-09-29 NOTE — ED Notes (Signed)
Bed: [facility] Expected date:  Expected time:  Means of arrival:  Comments: Hallucinations

## 2014-09-29 NOTE — ED Provider Notes (Signed)
CSN: [id number]     Arrival date & time 09/29/14  1930 History   First MD Initiated Contact with Patient 09/29/14 2038     Chief Complaint  Patient presents with  . Hallucinations     (Consider location/radiation/quality/duration/timing/severity/associated sxs/prior Treatment) HPI  Patient states he has been on oxycodone 3 times a day for chronic pain in his back and his legs from prior back and knee surgeries. He states his knees started bothering him last week andt hey felt tight and swollen and they started hurting. He states he took an extra oxycodone. He reports he does not remember what happened after that. He is not sure if he took any other extra pills. However it was noted by EMS that he had cut open his furniture in his house. He does report he sees people in his house. He states some look like cartoon characters, some look like "lazy old slobs". He does not have any verbal communication with them. He states if he leaves the house he does not see them. He only sees him within his house. He states he's never had this happen before. He however states he's been carrying on his normal activity and went to a football game Friday night at the local high school. He does report having some memory problems before this and sometimes cannot remember his phone number. He also states sometimes he has to hurry up and finish his sentence or he'll forget what he's going to say. He denies feeling depressed. He denies suicidal or homicidal ideation. He also reports some SOB over the weekend, but no chest pain.  No nausea or vomiting.    PCP [facility] Colen Darling in [facility]  Past Medical History  Diagnosis Date  . Back pain   . Colitis   . Prostate cancer    Past Surgical History  Procedure Laterality Date  . Prostatectomy    . Laminectomy    . Knee arthroscopy     Family History  Problem Relation Age of Onset  . Cancer Brother   . Cancer Maternal Grandmother    History  Substance Use Topics   . Smoking status: Former Smoker    Types: Cigarettes  . Smokeless tobacco: Not on file  . Alcohol Use: No  lives at home  Lives alone  Review of Systems  All other systems reviewed and are negative.     Allergies  Other  Home Medications   Prior to Admission medications   Medication Sig Start Date End Date Taking? Authorizing Provider  docusate sodium (COLACE) 50 MG capsule Take by mouth 3 (three) times daily as needed for constipation.   Yes Historical Provider, MD  oxycodone (ROXICODONE) 30 MG immediate release tablet Take 30 mg by mouth every 4 (four) hours as needed for pain.    Yes Historical Provider, MD  clindamycin (CLEOCIN) 150 MG capsule Take 3 capsules (450 mg total) by mouth 3 (three) times daily. Patient not taking: Reported on 09/29/2014 08/14/13   Marissa Sciacca, PA-C   BP 133/84 mmHg  Pulse 104  Temp(Src) 98 F (36.7 C) (Oral)  Resp 15  Ht 5\' 10"  (1.778 m)  Wt 184 lb (83.462 kg)  BMI 26.40 kg/m2  SpO2 98%  Vital signs normal except for tachycardia  Physical Exam  Constitutional: He appears well-developed and well-nourished.  Non-toxic appearance. He does not appear ill. No distress.  HENT:  Head: Normocephalic and atraumatic.  Right Ear: External ear normal.  Left Ear: External ear normal.  Nose: Nose normal. No mucosal edema or rhinorrhea.  Mouth/Throat: Oropharynx is clear and moist and mucous membranes are normal. No dental abscesses or uvula swelling.  Eyes: Conjunctivae and EOM are normal. Pupils are equal, round, and reactive to light.  Neck: Normal range of motion and full passive range of motion without pain. Neck supple.  Cardiovascular: Normal rate, regular rhythm and normal heart sounds.  Exam reveals no gallop and no friction rub.   No murmur heard. Pulmonary/Chest: Effort normal and breath sounds normal. No respiratory distress. He has no wheezes. He has no rhonchi. He has no rales. He exhibits no tenderness and no crepitus.  Abdominal:  Soft. Normal appearance and bowel sounds are normal. He exhibits no distension. There is no tenderness. There is no rebound and no guarding.  Musculoskeletal: Normal range of motion. He exhibits edema. He exhibits no tenderness.  Moves all extremities well. Patient's noted to have bilateral edema of his lower legs.  Neurological: He is alert. He has normal strength. No cranial nerve deficit.  When asked what year it is patient states "16". He states it is November. He has unable to say the president's name.  Skin: Skin is warm, dry and intact. No rash noted. No erythema. No pallor.  Psychiatric: He has a normal mood and affect. His speech is normal and behavior is normal. His mood appears not anxious.  Nursing note and vitals reviewed.   ED Course  Procedures (including critical care time)  Head CT was done for patient's complaint of memory problems. Patient's d-dimer was elevated. He has been having swelling and pain in his lower extremities. CT angiography of the chest was done. Patient's medical screening tests have all been benign. He is going to have a psychiatric evaluation in the ED.  TSS consult pending at end of shift.   Labs Review Results for orders placed or performed during the hospital encounter of 09/29/14  Acetaminophen level  Result Value Ref Range   Acetaminophen (Tylenol), Serum <15.0 10 - 30 ug/mL  CBC  Result Value Ref Range   WBC 5.5 4.0 - 10.5 K/uL   RBC 4.82 4.22 - 5.81 MIL/uL   Hemoglobin 13.4 13.0 - 17.0 g/dL   HCT 41.4 39.0 - 52.0 %   MCV 85.9 78.0 - 100.0 fL   MCH 27.8 26.0 - 34.0 pg   MCHC 32.4 30.0 - 36.0 g/dL   RDW 13.5 11.5 - 15.5 %   Platelets 320 150 - 400 K/uL  Comprehensive metabolic panel  Result Value Ref Range   Sodium 145 137 - 147 mEq/L   Potassium 4.0 3.7 - 5.3 mEq/L   Chloride 106 96 - 112 mEq/L   CO2 27 19 - 32 mEq/L   Glucose, Bld 111 (H) 70 - 99 mg/dL   BUN 18 6 - 23 mg/dL   Creatinine, Ser 0.92 0.50 - 1.35 mg/dL   Calcium 9.6  8.4 - 10.5 mg/dL   Total Protein 7.5 6.0 - 8.3 g/dL   Albumin 4.1 3.5 - 5.2 g/dL   AST 26 0 - 37 U/L   ALT 14 0 - 53 U/L   Alkaline Phosphatase 67 39 - 117 U/L   Total Bilirubin 0.5 0.3 - 1.2 mg/dL   GFR calc non Af Amer 85 (L) >90 mL/min   GFR calc Af Amer >90 >90 mL/min   Anion gap 12 5 - 15  Ethanol (ETOH)  Result Value Ref Range   Alcohol, Ethyl (B) <11 0 - 11 mg/dL  Salicylate level  Result Value Ref Range   Salicylate Lvl <9.7 (L) 2.8 - 20.0 mg/dL  Urine Drug Screen  Result Value Ref Range   Opiates NONE DETECTED NONE DETECTED   Cocaine NONE DETECTED NONE DETECTED   Benzodiazepines NONE DETECTED NONE DETECTED   Amphetamines NONE DETECTED NONE DETECTED   Tetrahydrocannabinol NONE DETECTED NONE DETECTED   Barbiturates NONE DETECTED NONE DETECTED  Pro b natriuretic peptide (BNP)  Result Value Ref Range   Pro B Natriuretic peptide (BNP) 67.5 0 - 125 pg/mL  Urinalysis, Routine w reflex microscopic  Result Value Ref Range   Color, Urine YELLOW YELLOW   APPearance CLEAR CLEAR   Specific Gravity, Urine 1.028 1.005 - 1.030   pH 5.5 5.0 - 8.0   Glucose, UA NEGATIVE NEGATIVE mg/dL   Hgb urine dipstick NEGATIVE NEGATIVE   Bilirubin Urine SMALL (A) NEGATIVE   Ketones, ur NEGATIVE NEGATIVE mg/dL   Protein, ur NEGATIVE NEGATIVE mg/dL   Urobilinogen, UA 1.0 0.0 - 1.0 mg/dL   Nitrite NEGATIVE NEGATIVE   Leukocytes, UA NEGATIVE NEGATIVE  D-dimer, quantitative  Result Value Ref Range   D-Dimer, Quant 0.60 (H) 0.00 - 0.48 ug/mL-FEU   Laboratory interpretation all normal    Imaging Review Dg Chest 2 View  09/29/2014   CLINICAL DATA:  Shortness of breath  EXAM: CHEST  2 VIEW  COMPARISON:  None.  FINDINGS: Cardiac shadow is within normal limits. The left hemidiaphragm is mildly elevated. No focal infiltrate or sizable effusion is seen. No acute bony abnormality is noted.  IMPRESSION: No active cardiopulmonary disease.   Electronically Signed   By: Inez Catalina M.D.   On: 09/29/2014  22:35   Ct Head Wo Contrast  09/29/2014   CLINICAL DATA:  Memory difficulties, past history prostate cancer  EXAM: CT HEAD WITHOUT CONTRAST  TECHNIQUE: Contiguous axial images were obtained from the base of the skull through the vertex without intravenous contrast.  COMPARISON:  08/14/2013  FINDINGS: Generalized atrophy.  Normal ventricular morphology.  No midline shift or mass effect.  Asymmetric positioning in gantry.  Dense calcifications in falx.  Otherwise normal appearance of brain parenchyma.  No intracranial hemorrhage, mass lesion or evidence acute infarction.  No extra-axial fluid collections.  Incomplete posterior arch C1, developmental variant.  Bones and sinuses otherwise unremarkable.  IMPRESSION: Diffuse atrophy.  No acute intracranial abnormalities.   Electronically Signed   By: Lavonia Dana M.D.   On: 09/29/2014 22:30   Ct Angio Chest Pe W/cm &/or Wo Cm  09/30/2014   CLINICAL DATA:  Shortness of breath, elevated D-dimer. Confusion. Leg swelling.  EXAM: CT ANGIOGRAPHY CHEST WITH CONTRAST  TECHNIQUE: Multidetector CT imaging of the chest was performed using the standard protocol during bolus administration of intravenous contrast. Multiplanar CT image reconstructions and MIPs were obtained to evaluate the vascular anatomy.  CONTRAST:  145mL OMNIPAQUE IOHEXOL 350 MG/ML SOLN  COMPARISON:  Chest radiograph September 29, 2014  FINDINGS: Adequate contrast opacification of the pulmonary artery's. Main pulmonary artery is not enlarged. No pulmonary arterial filling defects to the level of the subsegmental branches.  Heart and pericardium are unremarkable, no right heart strain. Thoracic aorta is normal course and caliber, unremarkable. No lymphadenopathy by CT size criteria. Tracheobronchial tree is patent, no pneumothorax. No pleural effusions, focal consolidations, pulmonary nodules or masses.  Included view of the abdomen is unremarkable. Visualized soft tissues and included osseous structures  appear acute; mild gynecomastia. Multilevel moderate to severe degenerative discs. Remote sternal fracture.  Review  of the MIP images confirms the above findings.  IMPRESSION: No acute pulmonary embolism or acute cardiopulmonary process.   Electronically Signed   By: Elon Alas   On: 09/30/2014 00:09     EKG Interpretation None       Date: 09/29/2014  Rate: 82  Rhythm: normal sinus rhythm  QRS Axis: normal  Intervals: normal  ST/T Wave abnormalities: normal  Conduction Disutrbances:none  Narrative Interpretation: electrode noise  Old EKG Reviewed: none available   MDM   Final diagnoses:  Memory difficulties  Shortness of breath  Visual hallucinations     Disposition pending  Rolland Porter, MD, Abram Sander    Janice Norrie, MD 09/30/14 [id number]

## 2014-09-30 LAB — URINALYSIS, ROUTINE W REFLEX MICROSCOPIC
Glucose, UA: NEGATIVE mg/dL
Hgb urine dipstick: NEGATIVE
Ketones, ur: NEGATIVE mg/dL
Leukocytes, UA: NEGATIVE
Nitrite: NEGATIVE
Protein, ur: NEGATIVE mg/dL
Specific Gravity, Urine: 1.028 (ref 1.005–1.030)
Urobilinogen, UA: 1 mg/dL (ref 0.0–1.0)
pH: 5.5 (ref 5.0–8.0)

## 2014-09-30 MED ORDER — ALUM & MAG HYDROXIDE-SIMETH 200-200-20 MG/5ML PO SUSP: 30.0000 mL | ORAL | Status: DC | PRN

## 2014-09-30 MED ORDER — IBUPROFEN 200 MG PO TABS
600.0000 mg | ORAL_TABLET | Freq: Three times a day (TID) | ORAL | Status: DC | PRN
Start: 2014-09-30 — End: 2014-09-30

## 2014-09-30 MED ORDER — ONDANSETRON HCL 4 MG PO TABS: 4.0000 mg | ORAL_TABLET | Freq: Three times a day (TID) | ORAL | Status: DC | PRN

## 2014-09-30 MED ORDER — DOCUSATE SODIUM 100 MG PO CAPS: 100.0000 mg | ORAL_CAPSULE | Freq: Three times a day (TID) | ORAL | Status: DC | PRN

## 2014-09-30 MED ORDER — OXYCODONE HCL 5 MG PO TABS: 30.0000 mg | ORAL_TABLET | Freq: Three times a day (TID) | ORAL | Status: DC | PRN

## 2014-09-30 MED ORDER — ZOLPIDEM TARTRATE 10 MG PO TABS: 10.0000 mg | ORAL_TABLET | Freq: Every evening | ORAL | Status: DC | PRN

## 2014-09-30 MED ORDER — NICOTINE 21 MG/24HR TD PT24: 21.0000 mg | MEDICATED_PATCH | Freq: Every day | TRANSDERMAL | Status: DC

## 2014-09-30 NOTE — ED Notes (Signed)
Patient has went to another room for tele-psych.

## 2014-09-30 NOTE — Discharge Instructions (Signed)
Please follow-up with your mental health team

## 2014-09-30 NOTE — ED Provider Notes (Signed)
1:62 AM Patient has been seen and cleared by the psychiatry team.  Please see consultation note for complete details.  I have not personally evaluated the patient.  I perform the discharge instructions as they asked me today  Hoy Morn, MD 09/30/14 [phone]

## 2014-09-30 NOTE — BH Assessment (Signed)
Assessment completed. Consulted Patriciaann Clan, PA-C who recommended that pt sign a no harm contract and follow up with his [place] provider. Dr. Venora Maples has been informed of the recommendations.

## 2014-09-30 NOTE — BH Assessment (Signed)
Tele Assessment Note   Evan Charles is an 68 y.o. male presenting to [facility] ED via EMS after a family member contacted EMS after not speaking to pt for 3 days. Pt reported that last week he took Oxycodone for his leg and thigh pain and he was lost. Pt stated "I didn't remember". Pt stated "I was only aware of taking 1 extra pill but I am unsure if I had any extra". When pt was asked about the damaged furniture in his home pt stated "it wasn't me but they told me it was me, who else could have done it".  Pt denies SI, HI and AVH at this time. Pt admitted that several days ago he was hallucination and stated "it was like cartoon characters; non-threatening". "It's like someone invaded your space". Pt also stated "the people were in my house and I wanted to push them out".  Pt did not endorse any depressive symptom sand shared that his sleep and appetite is good. Pt reported that he own a pistol but did not report any pending criminal charges or upcoming court dates. Pt did not report any illicit substance or alcohol abuse at this time. Pt did not report a history of mental illness or psychiatric hospitalizations.  Pt is alert and oriented x3(place, person, and situation). Pt maintained good eye contact and his speech is normal. Pt mood is euthymic but pleasant. Pt thought process is coherent and relevant. Pt is able to reliably contract for safety. Pt has agreed to sign a no harm contract and follow up with his [place] provider. Pt stated "I have an appointment on the 27th".   Axis I: See current hospital problem list  Past Medical History:  Past Medical History  Diagnosis Date  . Back pain   . Colitis   . Prostate cancer     Past Surgical History  Procedure Laterality Date  . Prostatectomy    . Laminectomy    . Knee arthroscopy      Family History:  Family History  Problem Relation Age of Onset  . Cancer Brother   . Cancer Maternal Grandmother     Social History:  reports that he has quit  smoking. His smoking use included Cigarettes. He smoked 0.00 packs per day. He does not have any smokeless tobacco history on file. He reports that he does not drink alcohol or use illicit drugs.  Additional Social History:  Alcohol / Drug Use History of alcohol / drug use?: No history of alcohol / drug abuse  CIWA: CIWA-Ar BP: 129/89 mmHg Pulse Rate: 104 COWS:    PATIENT STRENGTHS: (choose at least two) Average or above average intelligence Capable of independent living  Allergies:  Allergies  Allergen Reactions  . Other Rash    Tomatoes causes a rash    Home Medications:  (Not in a hospital admission)  OB/GYN Status:  No LMP for male patient.  General Assessment Data [facility] of Assessment: [facility] Is this a Tele or Face-to-Face Assessment?: Face-to-Face Is this an Initial Assessment or a Re-assessment for this encounter?: Initial Assessment Living Arrangements: Alone Can pt return to current living arrangement?: Yes Admission Status: Voluntary Is patient capable of signing voluntary admission?: Yes Transfer from: Home Referral Source: Self/Family/Friend     [place] Living Arrangements: Alone Name of Psychiatrist: No provider reported Name of Therapist: No provider reported   Education Status Is patient currently in school?: No  Risk to self with the past 6 months  Suicidal Ideation: No Suicidal Intent: No Is patient at risk for suicide?: No Suicidal Plan?: No Access to Means: No What has been your use of drugs/alcohol within the last 12 months?: No alcohol or drug use reported.  Previous Attempts/Gestures: No How many times?: 0 Other Self Harm Risks: No other self harm risk identified at this time.  Triggers for Past Attempts: None known Intentional Self Injurious Behavior: None Family Suicide History: No Recent stressful life event(s):  (No stressful events reported. ) Persecutory voices/beliefs?: No Depression: No Depression Symptoms:  Feeling angry/irritable Substance abuse history and/or treatment for substance abuse?: No Suicide prevention information given to non-admitted patients: Not applicable  Risk to Others within the past 6 months Homicidal Ideation: No Thoughts of Harm to Others: No Current Homicidal Intent: No Current Homicidal Plan: No Access to Homicidal Means: No Identified Victim: NA History of harm to others?: No Assessment of Violence: None Noted Violent Behavior Description: No violent behaviors reported. Pt is calm and cooperative at this time  Does patient have access to weapons?: Yes (Comment) ("I have a pistol". ) Criminal Charges Pending?: No Does patient have a court date: No  Psychosis Hallucinations: None noted Delusions: None noted  Mental Status Report Appear/Hygiene: In hospital gown Eye Contact: Good Motor Activity: Rigidity Speech: Logical/coherent Level of Consciousness: Alert Mood: Pleasant, Euthymic Affect: Appropriate to circumstance Anxiety Level: None Thought Processes: Relevant, Coherent Judgement: Unimpaired Orientation: Person, Time, Place, Situation Obsessive Compulsive Thoughts/Behaviors: None  Cognitive Functioning Concentration: Normal Memory: Recent Impaired IQ: Average Insight: Fair Impulse Control: Fair Appetite: Good Weight Loss: 0 Weight Gain: 0 Sleep: No Change Total Hours of Sleep: 8 Vegetative Symptoms: None  ADLScreening [facility] Assessment Services) Patient's cognitive ability adequate to safely complete daily activities?: Yes Patient able to express need for assistance with ADLs?: Yes Independently performs ADLs?: Yes (appropriate for developmental age)  Prior Inpatient Therapy Prior Inpatient Therapy: No  Prior Outpatient Therapy Prior Outpatient Therapy: No  ADL Screening (condition at time of admission) Patient's cognitive ability adequate to safely complete daily activities?: Yes Patient able to express need for assistance with  ADLs?: Yes Independently performs ADLs?: Yes (appropriate for developmental age)       Abuse/Neglect Assessment (Assessment to be complete while patient is alone) Physical Abuse: Denies Verbal Abuse: Denies Sexual Abuse: Denies Exploitation of patient/patient's resources: Denies Self-Neglect: Denies     Regulatory affairs officer (For Healthcare) Does patient have an advance directive?: No Would patient like information on creating an advanced directive?: Yes Higher education careers adviser given    Additional Information 1:1 In Past 12 Months?: No CIRT Risk: No Elopement Risk: No Does patient have medical clearance?: Yes     Disposition:  Disposition Initial Assessment Completed for this Encounter: Yes Disposition of Patient: Other dispositions Other disposition(s): To current provider Patient referred to:  [facility] provider )  Marcellene Shivley S 09/30/2014 2:21 AM

## 2014-09-30 NOTE — ED Notes (Signed)
Pt returned from CT. Patient requesting something to drink, informed patient CT results would need to come back before any fluids given.

## 2014-11-01 ENCOUNTER — Encounter (HOSPITAL_COMMUNITY): Payer: Self-pay | Admitting: *Deleted

## 2014-11-01 ENCOUNTER — Emergency Department (HOSPITAL_COMMUNITY)
Admission: EM | Admit: 2014-11-01 | Discharge: 2014-11-02 | Disposition: A | Discharge status: Other Acute Inpatient Hospital | Payer: Medicare Other | Attending: Emergency Medicine | Admitting: Emergency Medicine

## 2014-11-01 ENCOUNTER — Emergency Department (HOSPITAL_COMMUNITY): Payer: Medicare Other

## 2014-11-01 DIAGNOSIS — Z8719 Personal history of other diseases of the digestive system: Secondary | ICD-10-CM | POA: Diagnosis not present

## 2014-11-01 DIAGNOSIS — R440 Auditory hallucinations: Secondary | ICD-10-CM | POA: Insufficient documentation

## 2014-11-01 DIAGNOSIS — Z87891 Personal history of nicotine dependence: Secondary | ICD-10-CM | POA: Diagnosis not present

## 2014-11-01 DIAGNOSIS — Z792 Long term (current) use of antibiotics: Secondary | ICD-10-CM | POA: Insufficient documentation

## 2014-11-01 DIAGNOSIS — Z8546 Personal history of malignant neoplasm of prostate: Secondary | ICD-10-CM | POA: Diagnosis not present

## 2014-11-01 DIAGNOSIS — R443 Hallucinations, unspecified: Secondary | ICD-10-CM

## 2014-11-01 DIAGNOSIS — R441 Visual hallucinations: Secondary | ICD-10-CM | POA: Insufficient documentation

## 2014-11-01 LAB — RAPID URINE DRUG SCREEN, HOSP PERFORMED
Amphetamines: NOT DETECTED
Barbiturates: NOT DETECTED
Benzodiazepines: NOT DETECTED
Cocaine: NOT DETECTED
Opiates: NOT DETECTED
Tetrahydrocannabinol: NOT DETECTED

## 2014-11-01 LAB — CBC
HCT: 44.1 % (ref 39.0–52.0)
Hemoglobin: 14.1 g/dL (ref 13.0–17.0)
MCH: 27.9 pg (ref 26.0–34.0)
MCHC: 32 g/dL (ref 30.0–36.0)
MCV: 87.2 fL (ref 78.0–100.0)
Platelets: 371 10*3/uL (ref 150–400)
RBC: 5.06 MIL/uL (ref 4.22–5.81)
RDW: 13.5 % (ref 11.5–15.5)
WBC: 4.3 10*3/uL (ref 4.0–10.5)

## 2014-11-01 LAB — URINALYSIS, ROUTINE W REFLEX MICROSCOPIC
Bilirubin Urine: NEGATIVE
Glucose, UA: NEGATIVE mg/dL
Hgb urine dipstick: NEGATIVE
KETONES UR: NEGATIVE mg/dL
LEUKOCYTES UA: NEGATIVE
NITRITE: NEGATIVE
Protein, ur: NEGATIVE mg/dL
Specific Gravity, Urine: 1.017 (ref 1.005–1.030)
Urobilinogen, UA: 1 mg/dL (ref 0.0–1.0)
pH: 6 (ref 5.0–8.0)

## 2014-11-01 LAB — TSH: TSH: 3.35 u[IU]/mL (ref 0.350–4.500)

## 2014-11-01 LAB — COMPREHENSIVE METABOLIC PANEL
ALT: 23 U/L (ref 0–53)
ANION GAP: 14 (ref 5–15)
AST: 43 U/L — AB (ref 0–37)
Albumin: 4.2 g/dL (ref 3.5–5.2)
Alkaline Phosphatase: 72 U/L (ref 39–117)
BUN: 12 mg/dL (ref 6–23)
CO2: 24 meq/L (ref 19–32)
CREATININE: 0.8 mg/dL (ref 0.50–1.35)
Calcium: 9.6 mg/dL (ref 8.4–10.5)
Chloride: 99 mEq/L (ref 96–112)
GFR calc Af Amer: >90 mL/min (ref >90)
GFR, EST NON AFRICAN AMERICAN: 90 mL/min — AB (ref >90)
Glucose, Bld: 112 mg/dL — ABNORMAL HIGH (ref 70–99)
Potassium: 4.3 mEq/L (ref 3.7–5.3)
Sodium: 137 mEq/L (ref 137–147)
Total Bilirubin: 0.7 mg/dL (ref 0.3–1.2)
Total Protein: 7.7 g/dL (ref 6.0–8.3)

## 2014-11-01 LAB — SALICYLATE LEVEL: Salicylate Lvl: <2 mg/dL — ABNORMAL LOW (ref 2.8–20.0)

## 2014-11-01 LAB — ETHANOL

## 2014-11-01 LAB — ACETAMINOPHEN LEVEL: Acetaminophen (Tylenol), Serum: <15 ug/mL (ref 10–30)

## 2014-11-01 MED ORDER — ONDANSETRON HCL 4 MG PO TABS: 4.0000 mg | ORAL_TABLET | Freq: Three times a day (TID) | ORAL | Status: DC | PRN

## 2014-11-01 MED ORDER — ACETAMINOPHEN 325 MG PO TABS: 650.0000 mg | ORAL_TABLET | ORAL | Status: DC | PRN

## 2014-11-01 NOTE — BH Assessment (Addendum)
Assessment Note  Evan Charles is an 68 y.o. male who presents to [facility] with reports of visual hallucinations. He states that for the last month, he's been dealing with two individuals living in his home.  He reports, "A couple made room and board in my home."  He's embarrassed because he cannot get the couple out of his home.  Last night he started seeing an animal who looked like a combination between a squirrel and a dog.  He reports that this morning he was tearful and unable to function because of his current situation.  He has been isolating himself in his home because he's been embarrassed to tell his friends what he's been experiencing.  He said last night, the couple brought a bunch of children into his home like they were planning on moving them in.  When this writer asked about the relationship of the couple to the patient, he explained that he did not know them because they were demons.  When this writer asked if they were real, he stated, they're real to me, but reported that he was the only one who could see them.  He states the man has gold teeth with a variety of shapes on them.  When asked if they react to him he said they do not. He can hit htem with a baseball bat and they won't flinch, but the man does know that Evan Charles does not like him.  When asked what they do in his house during the day he says they make love. Today, he says he got fed up because he has been "nursing someone [he doesn't] know" for the last month.  Evan Charles reports feeling stressed and depressed related to this situation, but denies SI or HI.  He does endorse feelings or anger, anhedonia, fatigue, isolating behavior, decrease in short term memory and concentration, and a decrease in grooming.  He denies SA.  He was seen for similar symptoms last month in the ED and followed up two days later with his physicians at the [place].  He reports they suggested decreasing the pain medication he takes for pain related to past  injuries.  He states he is down to only taking one a day, but the symptoms have not abated. Pt lost his train of thought and also forgot words a few times during the conversation. Consulted with Waylan Boga, [facility] NP who agrees pt will benefit from inpatient treatment.  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 Back pain   . Colitis   . Prostate cancer    Axis IV: economic problems and problems with access to health care services Axis V: 41-50 serious symptoms  Past Medical History:  Past Medical History  Diagnosis Date  . Back pain   . Colitis   . Prostate cancer     Past Surgical History  Procedure Laterality Date  . Prostatectomy    . Laminectomy    . Knee arthroscopy      Family History:  Family History  Problem Relation Age of Onset  . Cancer Brother   . Cancer Maternal Grandmother     Social History:  reports that he has quit smoking. His smoking use included Cigarettes. He smoked 0.00 packs per day. He does not have any smokeless tobacco history on file. He reports that he does not drink alcohol or use illicit drugs.  Additional Social History:  CIWA: CIWA-Ar BP: 123/76 mmHg Pulse Rate: 97 COWS:    Allergies:  Allergies  Allergen Reactions  . Other Rash    Tomatoes causes a rash    Home Medications:  (Not in a hospital admission)  OB/GYN Status:  No LMP for male patient.  General Assessment Data Location of Assessment: [facility] Is this a Tele or Face-to-Face Assessment?: Face-to-Face Is this an Initial Assessment or a Re-assessment for this encounter?: Initial Assessment Living Arrangements: Alone Can pt return to current living arrangement?: Yes Admission Status: Voluntary Is patient capable of signing voluntary admission?: Yes Transfer from: Home Referral Source: Self/Family/Friend     [place] Living Arrangements: Alone Name of  Psychiatrist: No provider reported Name of Therapist: No provider reported   Education Status Is patient currently in school?: No  Risk to self with the past 6 months Is patient at risk for suicide?: No, but patient needs Medical Clearance                                     Advance Directives (For Healthcare) Does patient have an advance directive?: No Would patient like information on creating an advanced directive?: No - patient declined information          Disposition:  Disposition Initial Assessment Completed for this Encounter: Yes  On Site Evaluation by:   Reviewed with Physician: Joanell Rising 11/01/2014 3:00 PM

## 2014-11-01 NOTE — Progress Notes (Signed)
Pt admitted to [facility] with diagnosis of Auditory hallucinations. Pt alert / cooperative with admission assessment. Pt confirms visual hallucinations of a couple living in his house as reported in his chart. Stated "I just can't get them out of my house". He also stated that he does hear whispers intermittently as well. Mr. Mcintire stated that he's been depressed for a little while because he cannot get the people out of his life. Vitals done and WNL. Pt's medication Oxycodone 30mg  tab (86 tabs) was taken to pharmacy by writer and sheet signed by pt in agreement. Mr. Coombs is awake in bed watching tv at present with bright affect when approach. Support and encouragement offered. Safety maintained on Q 15 minutes checks as ordered. No physical distress to note at this time.

## 2014-11-01 NOTE — ED Provider Notes (Signed)
CSN: [id number]     Arrival date & time 11/01/14  1231 History   First MD Initiated Contact with Patient 11/01/14 1417     Chief Complaint  Patient presents with  . hallucinations      (Consider location/radiation/quality/duration/timing/severity/associated sxs/prior Treatment) HPI Comments: Patient reports hallucinations in the form of hearing voices and seeing things in his house since yesterday. Similar presentation one month ago that was attributed to narcotic use. States he's been taking narcotics regularly for many years with no change. He hears voices that talked to him and feels people are watching him and his house. He states a couple has made room and board in his house and he can't get them out. He thinks they are demons. He denies any other medication use or drug abuse. Denies any headache, chest pain, abdominal pain, nausea or vomiting. Denies any fever. Good by mouth intake and urine output. No psychiatric history. He does have a gun in his house. He denies any suicidal thoughts currently. He sometimes expresses homicidal thoughts towards the things he sees in his house.  The history is provided by the patient.    Past Medical History  Diagnosis Date  . Back pain   . Colitis   . Prostate cancer    Past Surgical History  Procedure Laterality Date  . Prostatectomy    . Laminectomy    . Knee arthroscopy     Family History  Problem Relation Age of Onset  . Cancer Brother   . Cancer Maternal Grandmother    History  Substance Use Topics  . Smoking status: Former Smoker    Types: Cigarettes  . Smokeless tobacco: Not on file  . Alcohol Use: No    Review of Systems  Constitutional: Negative for fever, activity change and appetite change.  HENT: Negative for congestion and rhinorrhea.   Eyes: Negative for visual disturbance.  Respiratory: Negative for cough, chest tightness and shortness of breath.   Cardiovascular: Negative for chest pain.  Gastrointestinal:  Negative for nausea, vomiting and abdominal pain.  Genitourinary: Negative for dysuria, hematuria and testicular pain.  Musculoskeletal: Negative for myalgias, back pain and arthralgias.  Skin: Negative for rash.  Allergic/Immunologic: Negative for environmental allergies.  Neurological: Negative for dizziness.  Psychiatric/Behavioral: Positive for hallucinations, behavioral problems, decreased concentration and agitation. Negative for suicidal ideas.  A complete 10 system review of systems was obtained and all systems are negative except as noted in the HPI and PMH.      Allergies  Other  Home Medications   Prior to Admission medications   Medication Sig Start Date End Date Taking? Authorizing Provider  clindamycin (CLEOCIN) 150 MG capsule Take 3 capsules (450 mg total) by mouth 3 (three) times daily. Patient not taking: Reported on 09/29/2014 08/14/13   Marissa Sciacca, PA-C  docusate sodium (COLACE) 50 MG capsule Take by mouth 3 (three) times daily as needed for constipation.    Historical Provider, MD  oxycodone (ROXICODONE) 30 MG immediate release tablet Take 30 mg by mouth every 4 (four) hours as needed for pain.     Historical Provider, MD   BP 123/76 mmHg  Pulse 97  Temp(Src) 97.4 F (36.3 C) (Oral)  Resp 16  SpO2 100% Physical Exam  Constitutional: He is oriented to person, place, and time. He appears well-developed and well-nourished. No distress.  HENT:  Head: Normocephalic and atraumatic.  Mouth/Throat: Oropharynx is clear and moist. No oropharyngeal exudate.  Eyes: Conjunctivae and EOM are normal. Pupils are equal,  round, and reactive to light.  Neck: Normal range of motion. Neck supple.  No meningismus.  Cardiovascular: Normal rate, regular rhythm, normal heart sounds and intact distal pulses.   No murmur heard. Pulmonary/Chest: Effort normal and breath sounds normal. No respiratory distress.  Abdominal: Soft. There is no tenderness. There is no rebound and no  guarding.  Musculoskeletal: Normal range of motion. He exhibits no edema or tenderness.  Neurological: He is alert and oriented to person, place, and time. No cranial nerve deficit. He exhibits normal muscle tone. Coordination normal.  No ataxia on finger to nose bilaterally. No pronator drift. 5/5 strength throughout. CN 2-12 intact. Negative Romberg. Equal grip strength. Sensation intact. Gait is normal.   Skin: Skin is warm.  Psychiatric: He has a normal mood and affect. His behavior is normal.  Nursing note and vitals reviewed.   ED Course  Procedures (including critical care time) Labs Review Labs Reviewed  COMPREHENSIVE METABOLIC PANEL - Abnormal; Notable for the following:    Glucose, Bld 112 (*)    AST 43 (*)    GFR calc non Af Amer 90 (*)    All other components within normal limits  SALICYLATE LEVEL - Abnormal; Notable for the following:    Salicylate Lvl <1.4 (*)    All other components within normal limits  ACETAMINOPHEN LEVEL  CBC  ETHANOL  URINE RAPID DRUG SCREEN (HOSP PERFORMED)  URINALYSIS, ROUTINE W REFLEX MICROSCOPIC  TSH    Imaging Review Dg Chest 2 View  11/01/2014   CLINICAL DATA:  Hallucinations, history of prostate cancer  EXAM: CHEST  2 VIEW  COMPARISON:  09/29/2014  FINDINGS: Cardiomediastinal silhouette is stable. No acute infiltrate or pleural effusion. No pulmonary edema. Stable mild degenerative changes thoracic spine.  IMPRESSION: No active cardiopulmonary disease.   Electronically Signed   By: Lahoma Crocker M.D.   On: 11/01/2014 17:05   Ct Head Wo Contrast  11/01/2014   CLINICAL DATA:  Visual hallucinations, reportedly seen animals and people. History of prostate cancer.  EXAM: CT HEAD WITHOUT CONTRAST  TECHNIQUE: Contiguous axial images were obtained from the base of the skull through the vertex without intravenous contrast.  COMPARISON:  09/29/2014  FINDINGS: The brainstem, cerebellum, cerebral peduncles, thalamus, basal ganglia, basilar cisterns, and  ventricular system appear within normal limits.  No intracranial hemorrhage, mass lesion, or acute CVA. 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 Mild chronic ethmoid sinusitis.  IMPRESSION: 1. Mild chronic ethmoid sinusitis.  No acute intracranial findings.   Electronically Signed   By: Sherryl Barters M.D.   On: 11/01/2014 15:27     EKG Interpretation None      MDM   Final diagnoses:  Hallucinations   Visual and auditory hallucinations since yesterday.. Vague HI. NO SI.  No somatic complaints.  No CP or SOB.  No headache.  Alert and oriented x3.  nonfocal neuro exam. CT head neg.. CXR negative.  UA negative.  Labs unremarkable. TSH normal. Patient medically cleared for psychiatric evaluation. Holding orders placed.  Will hold narcotics at this time.  BP 123/76 mmHg  Pulse 97  Temp(Src) 97.4 F (36.3 C) (Oral)  Resp 16  SpO2 100%   Ezequiel Essex, MD 11/01/14 [phone]

## 2014-11-01 NOTE — ED Notes (Signed)
Pt has been walking back and forth to bathroom without assistance and not using his cane.

## 2014-11-01 NOTE — BH Assessment (Signed)
Writer informed the TTS [facility] of the consult.

## 2014-11-01 NOTE — ED Notes (Signed)
Pt reports he was here 1 month ago for hallcuinations. Denies SI/HI, reports since yesterday he has been seeing people and animals. Denies pain. Pt must be redirected often.

## 2014-11-02 ENCOUNTER — Encounter (HOSPITAL_COMMUNITY): Payer: Self-pay | Admitting: Registered Nurse

## 2014-11-02 DIAGNOSIS — F1994 Other psychoactive substance use, unspecified with psychoactive substance-induced mood disorder: Secondary | ICD-10-CM

## 2014-11-02 DIAGNOSIS — R443 Hallucinations, unspecified: Secondary | ICD-10-CM | POA: Diagnosis present

## 2014-11-02 DIAGNOSIS — F29 Unspecified psychosis not due to a substance or known physiological condition: Secondary | ICD-10-CM

## 2014-11-02 MED ORDER — ALUM & MAG HYDROXIDE-SIMETH 200-200-20 MG/5ML PO SUSP: 30.0000 mL | ORAL | Status: DC | PRN

## 2014-11-02 NOTE — ED Notes (Signed)
Pt refusing to transfer to [facility] and asking to speak to MD to discuss alternatives. Will wait for treatment team to assess

## 2014-11-02 NOTE — ED Notes (Signed)
Attempted to call report to [facility] and staff reported to call back after 7 am.

## 2014-11-02 NOTE — ED Notes (Signed)
Pt c/o pain but refused available prn, will continue to monitor

## 2014-11-02 NOTE — BHH Counselor (Signed)
The following facilities were contacted in an attempt to place the pt:  Referral sent for review: [facility]-no beds per Caryl Pina, but referral faxed for possible wait list [facility]- no beds per Hinton Dyer, but discharges expected tomorrow, referral faxed for possible wait list [facility]- referral faxed for review [facility] per Tracy,referral faxed for review [facility] per Pamala Hurry, referral faxed for review   At Capacity: [facility] per Janett Billow [facility] per Gregary Signs  TTS will continue to seek placement for pt.  Faylene Kurtz, MS, CRC, [place] Triage Specialist [facility]

## 2014-11-02 NOTE — BH Assessment (Signed)
Received call from [facility] saying Pt has been accepted by Dr. Tacey Heap. Nursing report should be called when Pt is ready for transport to [phone]. Notified Dr. Linton Flemings and Miguel Rota, RN of acceptance.  Orpah Greek Rosana Hoes, [facility] Triage Specialist [phone]

## 2014-11-02 NOTE — Consult Note (Signed)
[facility] Consult   Reason for Consult:  Hallucinations  Referring Physician:  EDP  Evan Charles is an 68 y.o. male. 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 Back pain   . Colitis   . Prostate cancer    AXIS IV:  other psychosocial or environmental problems AXIS V:  51-60 moderate symptoms  Plan:  Recommend psychiatric Inpatient admission when medically cleared.  Subjective:   Evan Charles is a 68 y.o. male patient presents to [facility] with complaints of hallucinations.  HPI:  Patient states that he is seeing spirits walking around in his house and that he is unable to get them to leave.  States that the spirits are not talking to him but whispering to each other.  Patient also states that he has Oxycodone for pain and he was having a pain that was worse yesterday so he took an extra pill (usually take 3 a day; yesterday took 4) during a spaced out period of time. Patient states that he is not sure if the hallucinations is caused by the oxycodone.   Patient denies suicidal/homicidal ideation ("but I do want to kill the spirits that won't get out of my house")  HPI Elements:   Location:  hallucinations. Quality:  seeing spirits. Severity:  took more than ordered medication (oxycodone). Timing:  1 day. Review of Systems  Musculoskeletal: Positive for myalgias and back pain.       Bilateral lower extremity pain (legs)   All other systems reviewed and are negative.  Family History  Problem Relation Age of Onset  . Cancer Brother   . Cancer Maternal Grandmother      Past Psychiatric History: Past Medical History  Diagnosis Date  . Back pain   . Colitis   . Prostate cancer     reports that he has quit smoking. His smoking use included Cigarettes. He smoked 0.00 packs per day. He does not have any  smokeless tobacco history on file. He reports that he does not drink alcohol or use illicit drugs. Family History  Problem Relation Age of Onset  . Cancer Brother   . Cancer Maternal Grandmother    Family History Substance Abuse: No Family Supports: Yes, List: (friends) Living Arrangements: Alone Can pt return to current living arrangement?: Yes   Allergies:   Allergies  Allergen Reactions  . Other Rash    Tomatoes causes a rash    ACT Assessment Complete:  Yes:    Educational Status    Risk to Self: Risk to self with the past 6 months Suicidal Ideation: No Suicidal Intent: No Is patient at risk for suicide?: No Suicidal Plan?: No Access to Means: No Previous Attempts/Gestures: No Triggers for Past Attempts: None known Intentional Self Injurious Behavior: None Family Suicide History: No Recent stressful life event(s):  (visual hallucinations) Persecutory voices/beliefs?: No Depression: Yes Depression Symptoms: Despondent, Tearfulness, Isolating, Guilt, Loss of interest in usual pleasures, Feeling angry/irritable Substance abuse history and/or treatment for substance abuse?: No Suicide prevention information given to non-admitted patients: Not applicable  Risk to Others: Risk to Others within the past 6 months Homicidal Ideation: No Thoughts of Harm to Others: No Current Homicidal Intent: No Current Homicidal Plan: No Access to Homicidal Means: No History of harm to others?: No Assessment of Violence: None Noted Does patient have  access to weapons?: Yes (Comment) (has a firearm in his closet) Criminal Charges Pending?: No Does patient have a court date: No  Abuse:    Prior Inpatient Therapy: Prior Inpatient Therapy Prior Inpatient Therapy: No  Prior Outpatient Therapy: Prior Outpatient Therapy Prior Outpatient Therapy: No (awaiting [facility] referrals)  Additional Information: Additional Information 1:1 In Past 12 Months?: No CIRT Risk: No Elopement Risk: No Does  patient have medical clearance?: Yes                  Objective: Blood pressure 138/75, pulse 114, temperature 98 F (36.7 C), temperature source Oral, resp. rate 16, SpO2 100 %.There is no weight on file to calculate BMI. Results for orders placed or performed during the hospital encounter of 11/01/14 (from the past 72 hour(s))  Acetaminophen level     Status: None   Collection Time: 11/01/14  1:00 PM  Result Value Ref Range   Acetaminophen (Tylenol), Serum <15.0 10 - 30 ug/mL    Comment:        THERAPEUTIC CONCENTRATIONS VARY SIGNIFICANTLY. A RANGE OF 10-30 ug/mL MAY BE AN EFFECTIVE CONCENTRATION FOR MANY PATIENTS. HOWEVER, SOME ARE BEST TREATED AT CONCENTRATIONS OUTSIDE THIS RANGE. ACETAMINOPHEN CONCENTRATIONS >150 ug/mL AT 4 HOURS AFTER INGESTION AND >50 ug/mL AT 12 HOURS AFTER INGESTION ARE OFTEN ASSOCIATED WITH TOXIC REACTIONS.   CBC     Status: None   Collection Time: 11/01/14  1:00 PM  Result Value Ref Range   WBC 4.3 4.0 - 10.5 K/uL   RBC 5.06 4.22 - 5.81 MIL/uL   Hemoglobin 14.1 13.0 - 17.0 g/dL   HCT 44.1 39.0 - 52.0 %   MCV 87.2 78.0 - 100.0 fL   MCH 27.9 26.0 - 34.0 pg   MCHC 32.0 30.0 - 36.0 g/dL   RDW 13.5 11.5 - 15.5 %   Platelets 371 150 - 400 K/uL  Comprehensive metabolic panel     Status: Abnormal   Collection Time: 11/01/14  1:00 PM  Result Value Ref Range   Sodium 137 137 - 147 mEq/L   Potassium 4.3 3.7 - 5.3 mEq/L   Chloride 99 96 - 112 mEq/L   CO2 24 19 - 32 mEq/L   Glucose, Bld 112 (H) 70 - 99 mg/dL   BUN 12 6 - 23 mg/dL   Creatinine, Ser 0.80 0.50 - 1.35 mg/dL   Calcium 9.6 8.4 - 10.5 mg/dL   Total Protein 7.7 6.0 - 8.3 g/dL   Albumin 4.2 3.5 - 5.2 g/dL   AST 43 (H) 0 - 37 U/L   ALT 23 0 - 53 U/L   Alkaline Phosphatase 72 39 - 117 U/L   Total Bilirubin 0.7 0.3 - 1.2 mg/dL   GFR calc non Af Amer 90 (L) >90 mL/min   GFR calc Af Amer >90 >90 mL/min    Comment: (NOTE) The eGFR has been calculated using the CKD EPI  equation. This calculation has not been validated in all clinical situations. eGFR's persistently <90 mL/min signify possible Chronic Kidney Disease.    Anion gap 14 5 - 15  Ethanol (ETOH)     Status: None   Collection Time: 11/01/14  1:00 PM  Result Value Ref Range   Alcohol, Ethyl (B) <11 0 - 11 mg/dL    Comment:        LOWEST DETECTABLE LIMIT FOR SERUM ALCOHOL IS 11 mg/dL FOR MEDICAL PURPOSES ONLY   Salicylate level     Status: Abnormal   Collection Time:  11/01/14  1:00 PM  Result Value Ref Range   Salicylate Lvl <6.4 (L) 2.8 - 20.0 mg/dL  Urine rapid drug screen (hosp performed)     Status: None   Collection Time: 11/01/14  1:57 PM  Result Value Ref Range   Opiates NONE DETECTED NONE DETECTED   Cocaine NONE DETECTED NONE DETECTED   Benzodiazepines NONE DETECTED NONE DETECTED   Amphetamines NONE DETECTED NONE DETECTED   Tetrahydrocannabinol NONE DETECTED NONE DETECTED   Barbiturates NONE DETECTED NONE DETECTED    Comment:        DRUG SCREEN FOR MEDICAL PURPOSES ONLY.  IF CONFIRMATION IS NEEDED FOR ANY PURPOSE, NOTIFY LAB WITHIN 5 DAYS.        LOWEST DETECTABLE LIMITS FOR URINE DRUG SCREEN Drug Class       Cutoff (ng/mL) Amphetamine      1000 Barbiturate      200 Benzodiazepine   680 Tricyclics       321 Opiates          300 Cocaine          300 THC              50   Urinalysis, Routine w reflex microscopic     Status: None   Collection Time: 11/01/14  1:57 PM  Result Value Ref Range   Color, Urine YELLOW YELLOW   APPearance CLEAR CLEAR   Specific Gravity, Urine 1.017 1.005 - 1.030   pH 6.0 5.0 - 8.0   Glucose, UA NEGATIVE NEGATIVE mg/dL   Hgb urine dipstick NEGATIVE NEGATIVE   Bilirubin Urine NEGATIVE NEGATIVE   Ketones, ur NEGATIVE NEGATIVE mg/dL   Protein, ur NEGATIVE NEGATIVE mg/dL   Urobilinogen, UA 1.0 0.0 - 1.0 mg/dL   Nitrite NEGATIVE NEGATIVE   Leukocytes, UA NEGATIVE NEGATIVE    Comment: MICROSCOPIC NOT DONE ON URINES WITH NEGATIVE PROTEIN,  BLOOD, LEUKOCYTES, NITRITE, OR GLUCOSE <1000 mg/dL.  TSH     Status: None   Collection Time: 11/01/14  3:37 PM  Result Value Ref Range   TSH 3.350 0.350 - 4.500 uIU/mL    Comment: Performed at [facility] are reviewed see values above.  Medications reviewed and no changes made.   No current facility-administered medications for this encounter.   Current Outpatient Prescriptions  Medication Sig Dispense Refill  . docusate sodium (COLACE) 50 MG capsule Take by mouth 3 (three) times daily as needed for constipation.    Marland Kitchen oxycodone (ROXICODONE) 30 MG immediate release tablet Take 30 mg by mouth every 4 (four) hours as needed for pain.       Psychiatric Specialty Exam:     Blood pressure 138/75, pulse 114, temperature 98 F (36.7 C), temperature source Oral, resp. rate 16, SpO2 100 %.There is no weight on file to calculate BMI.  General Appearance: Casual  Eye Contact::  Good  Speech:  Clear and Coherent and Normal Rate  Volume:  Normal  Mood:  Anxious  Affect:  Congruent  Thought Process:  Circumstantial and Goal Directed  Orientation:  Full (Time, Place, and Person)  Thought Content:  Hallucinations: Visual and Rumination  Suicidal Thoughts:  No  Homicidal Thoughts:  No  Memory:  Immediate;   Good Recent;   Good Remote;   Good  Judgement:  Intact  Insight:  Fair  Psychomotor Activity:  Normal  Concentration:  Fair  Recall:  [employer] of Knowledge:Fair  Language: Fair  Akathisia:  No  Handed:  Right  AIMS (if indicated):     Assets:  Communication Skills Desire for Improvement Housing Social Support  Sleep:      Musculoskeletal: Strength & Muscle Tone: within normal limits Gait & Station: normal Patient leans: N/A  Treatment Plan Summary: Daily contact with patient to assess and evaluate symptoms and progress in treatment Medication management Inpatient  treatment recommended  Earleen Newport, FNP-BC 11/02/2014 2:59 PM  Patient seen,  evaluated and I agree with notes by Nurse Practitioner. Corena Pilgrim, MD

## 2014-12-13 ENCOUNTER — Emergency Department (HOSPITAL_COMMUNITY)
Admission: EM | Admit: 2014-12-13 | Discharge: 2014-12-13 | Payer: Medicare Other | Attending: Emergency Medicine | Admitting: Emergency Medicine

## 2014-12-13 ENCOUNTER — Emergency Department (HOSPITAL_COMMUNITY): Payer: Medicare Other

## 2014-12-13 ENCOUNTER — Encounter (HOSPITAL_COMMUNITY): Payer: Self-pay | Admitting: Emergency Medicine

## 2014-12-13 DIAGNOSIS — Z8546 Personal history of malignant neoplasm of prostate: Secondary | ICD-10-CM | POA: Diagnosis not present

## 2014-12-13 DIAGNOSIS — Z87891 Personal history of nicotine dependence: Secondary | ICD-10-CM | POA: Insufficient documentation

## 2014-12-13 DIAGNOSIS — G8929 Other chronic pain: Secondary | ICD-10-CM | POA: Insufficient documentation

## 2014-12-13 DIAGNOSIS — Z8719 Personal history of other diseases of the digestive system: Secondary | ICD-10-CM | POA: Diagnosis not present

## 2014-12-13 DIAGNOSIS — F11220 Opioid dependence with intoxication, uncomplicated: Secondary | ICD-10-CM

## 2014-12-13 DIAGNOSIS — R441 Visual hallucinations: Secondary | ICD-10-CM

## 2014-12-13 DIAGNOSIS — F29 Unspecified psychosis not due to a substance or known physiological condition: Secondary | ICD-10-CM

## 2014-12-13 LAB — URINALYSIS, ROUTINE W REFLEX MICROSCOPIC
BILIRUBIN URINE: NEGATIVE
GLUCOSE, UA: NEGATIVE mg/dL
KETONES UR: NEGATIVE mg/dL
LEUKOCYTES UA: NEGATIVE
Nitrite: NEGATIVE
Protein, ur: NEGATIVE mg/dL
Specific Gravity, Urine: 1.02 (ref 1.005–1.030)
UROBILINOGEN UA: 1 mg/dL (ref 0.0–1.0)
pH: 5 (ref 5.0–8.0)

## 2014-12-13 LAB — CBC
HEMATOCRIT: 42.1 % (ref 39.0–52.0)
Hemoglobin: 13.4 g/dL (ref 13.0–17.0)
MCH: 28.4 pg (ref 26.0–34.0)
MCHC: 31.8 g/dL (ref 30.0–36.0)
MCV: 89.2 fL (ref 78.0–100.0)
Platelets: 288 10*3/uL (ref 150–400)
RBC: 4.72 MIL/uL (ref 4.22–5.81)
RDW: 13.3 % (ref 11.5–15.5)
WBC: 3.7 10*3/uL — ABNORMAL LOW (ref 4.0–10.5)

## 2014-12-13 LAB — COMPREHENSIVE METABOLIC PANEL
ALBUMIN: 4.2 g/dL (ref 3.5–5.2)
ALK PHOS: 54 U/L (ref 39–117)
ALT: 19 U/L (ref 0–53)
AST: 25 U/L (ref 0–37)
Anion gap: 8 (ref 5–15)
BUN: 13 mg/dL (ref 6–23)
CO2: 30 mmol/L (ref 19–32)
Calcium: 9.4 mg/dL (ref 8.4–10.5)
Chloride: 104 mmol/L (ref 96–112)
Creatinine, Ser: 0.92 mg/dL (ref 0.50–1.35)
GFR calc non Af Amer: 84 mL/min — ABNORMAL LOW (ref >90)
GLUCOSE: 121 mg/dL — AB (ref 70–99)
Potassium: 4.2 mmol/L (ref 3.5–5.1)
Sodium: 142 mmol/L (ref 135–145)
Total Bilirubin: 0.7 mg/dL (ref 0.3–1.2)
Total Protein: 7.3 g/dL (ref 6.0–8.3)

## 2014-12-13 LAB — RAPID URINE DRUG SCREEN, HOSP PERFORMED
Amphetamines: NOT DETECTED
Barbiturates: NOT DETECTED
Benzodiazepines: NOT DETECTED
Cocaine: NOT DETECTED
Opiates: POSITIVE — AB
TETRAHYDROCANNABINOL: NOT DETECTED

## 2014-12-13 LAB — SALICYLATE LEVEL: Salicylate Lvl: <4 mg/dL (ref 2.8–20.0)

## 2014-12-13 LAB — ETHANOL: Alcohol, Ethyl (B): <5 mg/dL (ref 0–9)

## 2014-12-13 LAB — URINE MICROSCOPIC-ADD ON

## 2014-12-13 LAB — ACETAMINOPHEN LEVEL

## 2014-12-13 MED ORDER — IBUPROFEN 200 MG PO TABS: 600.0000 mg | ORAL_TABLET | Freq: Three times a day (TID) | ORAL | Status: DC | PRN

## 2014-12-13 MED ORDER — ONDANSETRON HCL 4 MG PO TABS: 4.0000 mg | ORAL_TABLET | Freq: Three times a day (TID) | ORAL | Status: DC | PRN

## 2014-12-13 MED ORDER — ACETAMINOPHEN 325 MG PO TABS: 650.0000 mg | ORAL_TABLET | ORAL | Status: DC | PRN

## 2014-12-13 NOTE — ED Notes (Signed)
MD at bedside. EDP YAO PRESENT TO EVALUATE THIS PT

## 2014-12-13 NOTE — ED Notes (Signed)
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 He reports he was seen here [facility]. He also c/o hallucinations and his friend reports that patient has torn up his house after the hallucinations. The patient reports "I talk to them but they don't talk back" referring to the hallucinations. He denies Si or HI. He wants inpatient treatment.

## 2014-12-13 NOTE — Progress Notes (Signed)
Report called to nurse at [facility]. Awaiting transportation from [employer].

## 2014-12-13 NOTE — BHH Counselor (Signed)
[name] informed [name] of the consult.

## 2014-12-13 NOTE — ED Provider Notes (Signed)
CSN: [id number]     Arrival date & time 12/13/14  1017 History   First MD Initiated Contact with Patient 12/13/14 1146     Chief Complaint  Patient presents with  . opiate detox   . Hallucinations   HPI  Patient is a 69 year old male with past medical history of back pain, emesis, and prostate cancer who presents emergency room for evaluation of opiate detox and hallucinations. Patient states that for the past 5. He has been on oxycodone 5 mg 3 times daily chronic back pain and right knee pain that has been replaced. Patient states that couple months ago he took too many oxycodone and since that time he has been having visual hallucinations. He states that he sees unknown people that he talks to an artery disease with but they never come back to him. He also states that sometimes they make him right things and care of his house. He denies suicidal or homicidal ideations at this time. Patient has a history of visual hallucinations and seeing for the same on 09/29/2014. He had a negative head CT at that time. He was discharged home after signing a Web designer. Patient is also to be admitted to a hospital in [place] that appears to have been resistant on 11/02/2014.  Past Medical History  Diagnosis Date  . Back pain   . Colitis   . Prostate cancer    Past Surgical History  Procedure Laterality Date  . Prostatectomy    . Laminectomy    . Knee arthroscopy     Family History  Problem Relation Age of Onset  . Cancer Brother   . Cancer Maternal Grandmother    History  Substance Use Topics  . Smoking status: Former Smoker    Types: Cigarettes  . Smokeless tobacco: Not on file  . Alcohol Use: No    Review of Systems  Constitutional: Negative for fever, chills and fatigue.  Respiratory: Negative for chest tightness and shortness of breath.   Cardiovascular: Negative for chest pain and palpitations.  Gastrointestinal: Negative for nausea, vomiting, abdominal pain, diarrhea and  constipation.  Genitourinary: Negative for dysuria, urgency, frequency, hematuria and difficulty urinating.  Psychiatric/Behavioral: Positive for hallucinations, confusion and agitation. Negative for suicidal ideas, self-injury, dysphoric mood and decreased concentration. The patient is not nervous/anxious and is not hyperactive.       Allergies  Other  Home Medications   Prior to Admission medications   Medication Sig Start Date End Date Taking? Authorizing Provider  docusate sodium (COLACE) 50 MG capsule Take by mouth 3 (three) times daily as needed for mild constipation (constipation).    Yes Historical Provider, MD  oxycodone (ROXICODONE) 30 MG immediate release tablet Take 30 mg by mouth every 4 (four) hours as needed for pain (pain).    Yes Historical Provider, MD  risperiDONE (RISPERDAL) 0.5 MG tablet Take 0.5 mg by mouth at bedtime.   Yes Historical Provider, MD  traZODone (DESYREL) 50 MG tablet Take 50 mg by mouth at bedtime.   Yes Historical Provider, MD   BP 107/69 mmHg  Pulse 98  Temp(Src) 97.3 F (36.3 C) (Oral)  Resp 16  SpO2 100% Physical Exam  Constitutional: He is oriented to person, place, and time. He appears well-developed and well-nourished. No distress.  HENT:  Head: Normocephalic and atraumatic.  Mouth/Throat: Oropharynx is clear and moist. No oropharyngeal exudate.  Eyes: Conjunctivae and EOM are normal. Pupils are equal, round, and reactive to light. No scleral icterus.  Neck: Normal  range of motion. Neck supple. No JVD present. No thyromegaly present.  Cardiovascular: Normal rate, regular rhythm, normal heart sounds and intact distal pulses.  Exam reveals no gallop and no friction rub.   No murmur heard. Pulmonary/Chest: Effort normal and breath sounds normal. No respiratory distress. He has no wheezes. He has no rales. He exhibits no tenderness.  Abdominal: Soft. Bowel sounds are normal. He exhibits no distension and no mass. There is no tenderness. There  is no rebound and no guarding.  Musculoskeletal: Normal range of motion.  Lymphadenopathy:    He has no cervical adenopathy.  Neurological: He is alert and oriented to person, place, and time. He has normal strength. No cranial nerve deficit or sensory deficit. Coordination normal.  Skin: Skin is warm and dry. He is not diaphoretic.  Psychiatric: He has a normal mood and affect. His behavior is normal. Judgment and thought content normal.  Nursing note and vitals reviewed.   ED Course  Procedures (including critical care time) Labs Review Labs Reviewed  ACETAMINOPHEN LEVEL - Abnormal; Notable for the following:    Acetaminophen (Tylenol), Serum <10.0 (*)    All other components within normal limits  CBC - Abnormal; Notable for the following:    WBC 3.7 (*)    All other components within normal limits  COMPREHENSIVE METABOLIC PANEL - Abnormal; Notable for the following:    Glucose, Bld 121 (*)    GFR calc non Af Amer 84 (*)    All other components within normal limits  URINE RAPID DRUG SCREEN (HOSP PERFORMED) - Abnormal; Notable for the following:    Opiates POSITIVE (*)    All other components within normal limits  URINALYSIS, ROUTINE W REFLEX MICROSCOPIC - Abnormal; Notable for the following:    Hgb urine dipstick SMALL (*)    All other components within normal limits  ETHANOL  SALICYLATE LEVEL  URINE MICROSCOPIC-ADD ON    Imaging Review Ct Head Wo Contrast  12/13/2014   CLINICAL DATA:  Visual hallucinations resulting in aggressive behavior for 1 month.  EXAM: CT HEAD WITHOUT CONTRAST  TECHNIQUE: Contiguous axial images were obtained from the base of the skull through the vertex without intravenous contrast.  COMPARISON:  Head CT scan 11/01/2014 and 09/29/2014.  FINDINGS: There is no evidence of acute intracranial abnormality including hemorrhage, infarct, mass lesion, mass effect, midline shift or abnormal extra-axial fluid collection. No hydrocephalus or pneumocephalus. The  calvarium is intact. Imaged paranasal sinuses and mastoid air cells are clear.  IMPRESSION: Negative head CT.   Electronically Signed   By: Inge Rise M.D.   On: 12/13/2014 13:39     EKG Interpretation None      MDM   Final diagnoses:  Visual hallucinations  Opioid dependence with uncomplicated intoxication   Patient 69 year old male who presents emergency room for evaluation of opiate use and visual hallucinations. Patient has a history of visual hallucinations. Given history of prostate cancer head CT was performed to make sure there was no evidence for medical reasons for hallucinations. CT head negative. UA is negative. CBC, CMP, ethanol, salicylate, and acetaminophen levels are unremarkable. UDS is positive only for opiates which she is prescribed. He is desiring detox from the opiates as he believes this is the cause for his visual hallucinations. He is seeking inpatient treatment at this time. Patient to be seen by TTS and evaluated for possible inpatient treatment. Patient has been seen by me and Dr. Darl Householder and is medically cleared at this time. Patient  to be moved to the psych daily. Temporary ED psych hold orders have been placed.    Cherylann Parr, PA-C 12/13/14 Struble Yao, MD 12/13/14 [phone]

## 2014-12-13 NOTE — Progress Notes (Signed)
CSW was notified by TTS that patient has been accepted into [facility]. Dr.Kohl is the accepting doctor. Patient will be in room 113.  Nurse Report # : [id number]  Willette Brace [phone] ED CSW 12/13/2014 4:05 PM

## 2014-12-13 NOTE — Consult Note (Signed)
[facility] Face-to-Face Psychiatry Consult   Reason for Consult:  Hallucination Referring Physician:  EDP Patient Identification: Evan Charles MRN:  [id number] Principal Diagnosis: Psychoses Diagnosis:   Patient Active Problem List   Diagnosis Date Noted  . Psychoses [F29]     Priority: High  . Hallucinations [R44.3] 11/02/2014    Total Time spent with patient: 45 minutes  Subjective:   Evan Charles is a 69 y.o. male patient admitted with Visual hallucination, Psychosis.  HPI:  AA male, 69 years old was evaluated for complaint of visual hallucination.  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  Patient reported that he was hospitalized at the [facility] hospital last month for visual hallucination for 5 days.  Patient reported having Hallucination seeing young men and women of different colors come into his house stealing his stuff.  Patient also stated that the women among's them were prostitutes  who were selling their bodies.  Patient reported that his visual hallucinations started after he took extra dose of his Oxycontine for pain.  He usually takes one tablet at a time but one day he mistakenly too a second dose in a space of two hours.  Patient reported that he started having visual hallucination after that.  He also reported that he throws things at the objects or people he is seeing and stated that at times he feels like nobody is even there but him.  Patient denies SI/HI/AH.  Patient lives alone but his ex-wife and daughter comes to visits him.  He owns a gun and does not know where his gun is.  He believes that those people he sees in his house took his gun.  Efforts to collect collateral information from his wife and daughter failed as none of them answered their phone.  Patient reports good sleep and appetite.  He has been accepted for admission and will be transferred to [facility] for treatment.  HPI Elements:   Location:  Psychosis, Visual hallucination.. Quality:   SEVERE. Severity:  SEVERE. Timing:  Acute, on going. Context:  Seeking treatment for Psychosis..  Past Medical History:  Past Medical History  Diagnosis Date  . Back pain   . Colitis   . Prostate cancer     Past Surgical History  Procedure Laterality Date  . Prostatectomy    . Laminectomy    . Knee arthroscopy     Family History:  Family History  Problem Relation Age of Onset  . Cancer Brother   . Cancer Maternal Grandmother    Social History:  History  Alcohol Use No     History  Drug Use No    History   Social History  . Marital Status: Single    Spouse Name: N/A    Number of Children: N/A  . Years of Education: N/A   Social History Main Topics  . Smoking status: Former Smoker    Types: Cigarettes  . Smokeless tobacco: None  . Alcohol Use: No  . Drug Use: No  . Sexual Activity: None   Other Topics Concern  . None   Social History Narrative   Additional Social History:    Pain Medications: Oxycodone for pain in hip and knee History of alcohol / drug use?: Yes Name of Substance 1: Oxycodone  1 - Age of First Use: 62 1 - Amount (size/oz): 30mg  daily 1 - Frequency: Daily 1 - Duration: AM  1 - Last Use / Amount: yesterday  Allergies:   Allergies  Allergen Reactions  . Other Rash    Tomatoes causes a rash    Vitals: Blood pressure 107/69, pulse 98, temperature 97.3 F (36.3 C), temperature source Oral, resp. rate 16, SpO2 100 %.  Risk to Self: Suicidal Ideation: No Suicidal Intent: No Is patient at risk for suicide?: No Suicidal Plan?: No Access to Means: No What has been your use of drugs/alcohol within the last 12 months?:  (Dependent on prescribed opiates for pain) How many times?: 0 Other Self Harm Risks: None Triggers for Past Attempts: None known Intentional Self Injurious Behavior: None Risk to Others: Homicidal Ideation: No Thoughts of Harm to Others: No Current Homicidal Intent: No Current Homicidal  Plan: No Access to Homicidal Means: No Identified Victim: None History of harm to others?: No Assessment of Violence: None Noted Violent Behavior Description: None Does patient have access to weapons?: Yes (Comment) (Fire arm) Criminal Charges Pending?: No Does patient have a court date: No Prior Inpatient Therapy: Prior Inpatient Therapy: Yes Prior Therapy Dates: Dec 2015 Prior Therapy Facilty/Provider(s): [facility] Reason for Treatment: Hallucinations Prior Outpatient Therapy: Prior Outpatient Therapy: No  Current Facility-Administered Medications  Medication Dose Route Frequency Provider Last Rate Last Dose  . acetaminophen (TYLENOL) tablet 650 mg  650 mg Oral Q4H PRN Courtney A Forcucci, PA-C      . ibuprofen (ADVIL,MOTRIN) tablet 600 mg  600 mg Oral Q8H PRN Courtney A Forcucci, PA-C      . ondansetron (ZOFRAN) tablet 4 mg  4 mg Oral Q8H PRN Courtney A Forcucci, PA-C       Current Outpatient Prescriptions  Medication Sig Dispense Refill  . docusate sodium (COLACE) 50 MG capsule Take by mouth 3 (three) times daily as needed for mild constipation (constipation).     Marland Kitchen oxycodone (ROXICODONE) 30 MG immediate release tablet Take 30 mg by mouth every 4 (four) hours as needed for pain (pain).     . risperiDONE (RISPERDAL) 0.5 MG tablet Take 0.5 mg by mouth at bedtime.    . traZODone (DESYREL) 50 MG tablet Take 50 mg by mouth at bedtime.      Musculoskeletal: Strength & Muscle Tone: seen lying down in bed Gait & Station: assessed in bed Patient leans: Assessed lying down in bed  Psychiatric Specialty Exam:     Blood pressure 107/69, pulse 98, temperature 97.3 F (36.3 C), temperature source Oral, resp. rate 16, SpO2 100 %.There is no weight on file to calculate BMI.  General Appearance: Casual  Eye Contact::  Good  Speech:  Clear and Coherent and Normal Rate  Volume:  Normal  Mood:  Anxious and Depressed  Affect:  Congruent and Depressed  Thought Process:  Coherent, Goal  Directed and Intact  Orientation:  Full (Time, Place, and Person)  Thought Content:  WDL  Suicidal Thoughts:  No  Homicidal Thoughts:  No  Memory:  Immediate;   Good Recent;   Fair Remote;   Poor  Judgement:  Fair  Insight:  Fair  Psychomotor Activity:  Normal  Concentration:  Good  Recall:  NA  Fund of Knowledge:Fair  Language: Good  Akathisia:  NA  Handed:  Right  AIMS (if indicated):     Assets:  Desire for Improvement  ADL's:  Intact  Cognition: Impaired,  Mild  Sleep:      Medical Decision Making: Established Problem, Worsening (2), Review of Medication Regimen & Side Effects (2) and Review of New Medication or Change in Dosage (2)  Treatment  Plan Summary: Daily contact with patient to assess and evaluate symptoms and progress in treatment, Medication management and Plan Admitted and accepted at [facility].  Plan:  Recommend psychiatric Inpatient admission when medically cleared. Transfer to [facility] for inpatient Psychiatric care Disposition: Admitted  Delfin Gant  PMHNP-BC 12/13/2014 4:15 PM  Patient seen, evaluated and I agree with notes by Nurse Practitioner. Corena Pilgrim, MD

## 2014-12-13 NOTE — BH Assessment (Addendum)
Assessment Note  Evan Charles is an 69 y.o. male who came in after having visual hallucinations of people and animals in his house. He stated this started about a month ago when he took two doses of his hydrocone "too close together". He states that he knows that the visions are not real but still talks to them telling them "get out of my house". He says the visions look like people but "have weird feet and big black eyes". He states that he throws things at them and has "torn up his house" because of this. Per history pt has a firearm in his closet. Pt also states that the visions go away when he is around other people and only occur in his house when he is alone. He was admitted to [facility] in December 2015 when these visions first occurred but is still seeing them now. He states that he does not want to go home because he is afraid. No other previous psych history per client's report.  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 Back pain   . Colitis   . Prostate cancer    Axis IV: other psychosocial or environmental problems and problems with primary support group Axis V: 21-30 behavior considerably influenced by delusions or hallucinations OR serious impairment in judgment, communication OR inability to function in almost all areas  Past Medical History:  Past Medical History  Diagnosis Date  . Back pain   . Colitis   . Prostate cancer     Past Surgical History  Procedure Laterality Date  . Prostatectomy    . Laminectomy    . Knee arthroscopy      Family History:  Family History  Problem Relation Age of Onset  . Cancer Brother   . Cancer Maternal Grandmother     Social History:  reports that he has quit smoking. His smoking use included Cigarettes. He does not have any smokeless tobacco  history on file. He reports that he does not drink alcohol or use illicit drugs.  Additional Social History:  Alcohol / Drug Use Pain Medications: Oxycodone for pain in hip and knee History of alcohol / drug use?: Yes Substance #1 Name of Substance 1: Oxycodone  1 - Age of First Use: 62 1 - Amount (size/oz): 30mg  daily 1 - Frequency: Daily 1 - Duration: AM  1 - Last Use / Amount: yesterday   CIWA: CIWA-Ar BP: 107/69 mmHg Pulse Rate: 98 COWS:    Allergies:  Allergies  Allergen Reactions  . Other Rash    Tomatoes causes a rash    Home Medications:  (Not in a hospital admission)  OB/GYN Status:  No LMP for male patient.  General Assessment Data Location of Assessment: [facility] Is this a Tele or Face-to-Face Assessment?: Face-to-Face Is this an Initial Assessment or a Re-assessment for this encounter?: Initial Assessment Living Arrangements: Alone Can pt return to current living arrangement?: Yes Admission Status: Voluntary Is patient capable of signing voluntary admission?: Yes Transfer from: Home Referral Source: Self/Family/Friend     [place] Living Arrangements: Alone Name of Psychiatrist:  (None) Name of Therapist: None  Education Status Is patient currently in school?: No  Risk to self with the past 6 months Suicidal Ideation: No Suicidal Intent: No Is patient at risk for suicide?: No Suicidal Plan?:  No Access to Means: No What has been your use of drugs/alcohol within the last 12 months?:  (Dependent on prescribed opiates for pain) Previous Attempts/Gestures: No How many times?: 0 Other Self Harm Risks: None Triggers for Past Attempts: None known Intentional Self Injurious Behavior: None Family Suicide History: No Recent stressful life event(s): Other (Comment) (Visual Hallucinations) Persecutory voices/beliefs?: No Depression: Yes Depression Symptoms: Despondent, Tearfulness Substance abuse history and/or treatment for substance  abuse?: No Suicide prevention information given to non-admitted patients: Not applicable  Risk to Others within the past 6 months Homicidal Ideation: No Thoughts of Harm to Others: No Current Homicidal Intent: No Current Homicidal Plan: No Access to Homicidal Means: No Identified Victim: None History of harm to others?: No Assessment of Violence: None Noted Violent Behavior Description: None Does patient have access to weapons?: Yes (Comment) (Fire arm) Criminal Charges Pending?: No Does patient have a court date: No  Psychosis Hallucinations: Visual Delusions: Unspecified  Mental Status Report Appear/Hygiene: Unremarkable Eye Contact: Good Motor Activity: Rigidity Speech: Logical/coherent Level of Consciousness: Alert Mood: Depressed Affect: Appropriate to circumstance Anxiety Level: Moderate Thought Processes: Coherent Judgement: Partial Orientation: Person, Place, Time, Situation Obsessive Compulsive Thoughts/Behaviors: None  Cognitive Functioning Concentration: Decreased Memory: Recent Intact, Remote Intact IQ: Average Insight: Fair Impulse Control: Fair Appetite: Good Weight Loss:  (0) Weight Gain: 0 Sleep: No Change Total Hours of Sleep: 8 Vegetative Symptoms: Unable to Assess  ADLScreening [facility] Assessment Services) Patient's cognitive ability adequate to safely complete daily activities?: Yes Patient able to express need for assistance with ADLs?: Yes Independently performs ADLs?: No (may need assistance or a device for walking high fall risk)  Prior Inpatient Therapy Prior Inpatient Therapy: Yes Prior Therapy Dates: Dec 2015 Prior Therapy Facilty/Provider(s): [facility] Reason for Treatment: Hallucinations  Prior Outpatient Therapy Prior Outpatient Therapy: No  ADL Screening (condition at time of admission) Patient's cognitive ability adequate to safely complete daily activities?: Yes Is the patient deaf or have difficulty hearing?: No Does the  patient have difficulty seeing, even when wearing glasses/contacts?: No Does the patient have difficulty concentrating, remembering, or making decisions?: No Patient able to express need for assistance with ADLs?: Yes Does the patient have difficulty dressing or bathing?: No Independently performs ADLs?: No (may need assistance or a device for walking high fall risk) Weakness of Legs: Right Weakness of Arms/Hands: None  Home Assistive Devices/Equipment Home Assistive Devices/Equipment: None (Did not have a device on admission but had to hold on to hand rails to walk)    Abuse/Neglect Assessment (Assessment to be complete while patient is alone) Physical Abuse: Denies Verbal Abuse: Denies Sexual Abuse: Denies Exploitation of patient/patient's resources: Denies Self-Neglect: Denies Values / Beliefs Cultural Requests During Hospitalization: None Spiritual Requests During Hospitalization: None Consults Spiritual Care Consult Needed: No Social Work Consult Needed: No Regulatory affairs officer (For Healthcare) Does patient have an advance directive?: No Would patient like information on creating an advanced directive?: No - patient declined information    Additional Information 1:1 In Past 12 Months?: No CIRT Risk: No Elopement Risk: No Does patient have medical clearance?: Yes     Disposition:  Disposition Initial Assessment Completed for this Encounter: Yes Disposition of Patient: Inpatient treatment program Type of inpatient treatment program: Adult  On Site Evaluation by:   Reviewed with Physician:    Jarryn Altland 12/13/2014 1:18 PM

## 2015-02-13 ENCOUNTER — Emergency Department (HOSPITAL_COMMUNITY): Payer: Medicare Other

## 2015-02-13 ENCOUNTER — Inpatient Hospital Stay (HOSPITAL_COMMUNITY)
Admission: EM | Admit: 2015-02-13 | Discharge: 2015-02-15 | DRG: 379 | Disposition: A | Discharge status: Skilled Nursing Facility | Payer: Medicare Other | Attending: Internal Medicine | Admitting: Internal Medicine

## 2015-02-13 ENCOUNTER — Encounter (HOSPITAL_COMMUNITY): Payer: Self-pay

## 2015-02-13 DIAGNOSIS — M549 Dorsalgia, unspecified: Secondary | ICD-10-CM

## 2015-02-13 DIAGNOSIS — G8929 Other chronic pain: Secondary | ICD-10-CM

## 2015-02-13 DIAGNOSIS — Z8719 Personal history of other diseases of the digestive system: Secondary | ICD-10-CM | POA: Insufficient documentation

## 2015-02-13 DIAGNOSIS — T39395A Adverse effect of other nonsteroidal anti-inflammatory drugs [NSAID], initial encounter: Secondary | ICD-10-CM | POA: Diagnosis present

## 2015-02-13 DIAGNOSIS — C61 Malignant neoplasm of prostate: Secondary | ICD-10-CM | POA: Diagnosis present

## 2015-02-13 DIAGNOSIS — M545 Low back pain, unspecified: Secondary | ICD-10-CM | POA: Insufficient documentation

## 2015-02-13 DIAGNOSIS — K922 Gastrointestinal hemorrhage, unspecified: Secondary | ICD-10-CM | POA: Diagnosis present

## 2015-02-13 DIAGNOSIS — Z87891 Personal history of nicotine dependence: Secondary | ICD-10-CM | POA: Diagnosis not present

## 2015-02-13 DIAGNOSIS — M5459 Other low back pain: Secondary | ICD-10-CM | POA: Diagnosis present

## 2015-02-13 DIAGNOSIS — D500 Iron deficiency anemia secondary to blood loss (chronic): Secondary | ICD-10-CM | POA: Diagnosis present

## 2015-02-13 DIAGNOSIS — Z79899 Other long term (current) drug therapy: Secondary | ICD-10-CM

## 2015-02-13 DIAGNOSIS — K921 Melena: Principal | ICD-10-CM | POA: Diagnosis present

## 2015-02-13 DIAGNOSIS — Z8546 Personal history of malignant neoplasm of prostate: Secondary | ICD-10-CM | POA: Diagnosis not present

## 2015-02-13 DIAGNOSIS — M4806 Spinal stenosis, lumbar region: Secondary | ICD-10-CM | POA: Diagnosis present

## 2015-02-13 DIAGNOSIS — R262 Difficulty in walking, not elsewhere classified: Secondary | ICD-10-CM | POA: Diagnosis not present

## 2015-02-13 HISTORY — DX: Polyneuropathy, unspecified: G62.9

## 2015-02-13 HISTORY — DX: Gastro-esophageal reflux disease without esophagitis: K21.9

## 2015-02-13 LAB — URINALYSIS, ROUTINE W REFLEX MICROSCOPIC
Bilirubin Urine: NEGATIVE
Glucose, UA: NEGATIVE mg/dL
Hgb urine dipstick: NEGATIVE
Ketones, ur: NEGATIVE mg/dL
Leukocytes, UA: NEGATIVE
Nitrite: NEGATIVE
Protein, ur: NEGATIVE mg/dL
Specific Gravity, Urine: >1.03 — ABNORMAL HIGH (ref 1.005–1.030)
Urobilinogen, UA: 0.2 mg/dL (ref 0.0–1.0)
pH: 5.5 (ref 5.0–8.0)

## 2015-02-13 LAB — CBC WITH DIFFERENTIAL/PLATELET
Basophils Absolute: 0 10*3/uL (ref 0.0–0.1)
Basophils Relative: 1 % (ref 0–1)
Eosinophils Absolute: 0.2 10*3/uL (ref 0.0–0.7)
Eosinophils Relative: 5 % (ref 0–5)
HCT: 27.4 % — ABNORMAL LOW (ref 39.0–52.0)
Hemoglobin: 8.9 g/dL — ABNORMAL LOW (ref 13.0–17.0)
Lymphocytes Relative: 23 % (ref 12–46)
Lymphs Abs: 1.2 10*3/uL (ref 0.7–4.0)
MCH: 27.9 pg (ref 26.0–34.0)
MCHC: 32.5 g/dL (ref 30.0–36.0)
MCV: 85.9 fL (ref 78.0–100.0)
Monocytes Absolute: 0.4 10*3/uL (ref 0.1–1.0)
Monocytes Relative: 8 % (ref 3–12)
Neutro Abs: 3.1 10*3/uL (ref 1.7–7.7)
Neutrophils Relative %: 63 % (ref 43–77)
Platelets: 318 10*3/uL (ref 150–400)
RBC: 3.19 MIL/uL — ABNORMAL LOW (ref 4.22–5.81)
RDW: 13.9 % (ref 11.5–15.5)
WBC: 5 10*3/uL (ref 4.0–10.5)

## 2015-02-13 LAB — COMPREHENSIVE METABOLIC PANEL
ALT: 14 U/L (ref 0–53)
AST: 17 U/L (ref 0–37)
Albumin: 3.5 g/dL (ref 3.5–5.2)
Alkaline Phosphatase: 56 U/L (ref 39–117)
Anion gap: 10 (ref 5–15)
BUN: 20 mg/dL (ref 6–23)
CO2: 23 mmol/L (ref 19–32)
Calcium: 8.2 mg/dL — ABNORMAL LOW (ref 8.4–10.5)
Chloride: 107 mmol/L (ref 96–112)
Creatinine, Ser: 0.95 mg/dL (ref 0.50–1.35)
GFR calc Af Amer: >90 mL/min (ref >90)
GFR calc non Af Amer: 83 mL/min — ABNORMAL LOW (ref >90)
Glucose, Bld: 96 mg/dL (ref 70–99)
Potassium: 3.7 mmol/L (ref 3.5–5.1)
Sodium: 140 mmol/L (ref 135–145)
Total Bilirubin: 0.2 mg/dL — ABNORMAL LOW (ref 0.3–1.2)
Total Protein: 5.8 g/dL — ABNORMAL LOW (ref 6.0–8.3)

## 2015-02-13 LAB — I-STAT TROPONIN, ED: Troponin i, poc: 0 ng/mL (ref 0.00–0.08)

## 2015-02-13 LAB — POC OCCULT BLOOD, ED: Fecal Occult Bld: POSITIVE — AB

## 2015-02-13 MED ORDER — SODIUM CHLORIDE 0.9 % IV SOLN
80.0000 mg | Freq: Once | INTRAVENOUS | Status: AC
  Administered 2015-02-14: 80 mg via INTRAVENOUS
  Filled 2015-02-13: qty 80

## 2015-02-13 NOTE — ED Notes (Signed)
Attempted report X 1.  Nurse to call back when available.

## 2015-02-13 NOTE — ED Notes (Signed)
Pt reports chronic back pain.  Pt smells of urine.  Pts daughter states pt lives alone and is unable to care for self and "needs help".

## 2015-02-13 NOTE — ED Provider Notes (Signed)
CSN: [id number]     Arrival date & time 02/13/15  1607 History   First MD Initiated Contact with Patient 02/13/15 1654     Chief Complaint  Patient presents with  . Back Pain    HPI   3 YOM with a history of laminectomy presents with chronic back pain. Pt reports a longstanding history of back pain that has progressively worsened. He reports the pain has been managed by the [facility] in [place]. 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 Patient reports that his back pain has progressed to the point where he has difficulty standing upright without pain, and difficulty ambulating. Describes the pain is low with no radiation of symptoms. He also notes a 2 month history of "black" stools. He reports he has a bowel movement every 3 or 4 days and notes that he's had 2-3 episodes of these "black" stools. He reports that he knows when they're going to be black as he has abdominal cramping and pain before the present. He denies night sweats, fevers, chills, he has a history of prostate cancer for which he had a prostatectomy; no subsequent follow-up from that. He states that he drags his feet when he walks and has noticed an acute change in his ability to ambulate when asked about urinary bowel habits he reports urinary urgency for which she sometimes cannot make it to the bathroom in time. He notes the same for bowel movements but finds this easier to control. Patient states that he lives at home but his daughter checks on him regularly. He admits that he probably needs help with day-to-day activities at this point. Patient denies headache, chest pain, shortness of breath, abdominal pain, pain in the distal extremities. Patient reports that over the past couple months he's noticed an increase in his fatigue; this is manifested in his inability to ambulate with these. Patient reports that he was prescribed medication for acid reflux  but discontinued it.  Past Medical History  Diagnosis Date  . Back pain   . Colitis   . Prostate cancer    Past Surgical History  Procedure Laterality Date  . Prostatectomy    . Laminectomy    . Knee arthroscopy     Family History  Problem Relation Age of Onset  . Cancer Brother   . Cancer Maternal Grandmother    History  Substance Use Topics  . Smoking status: Former Smoker    Types: Cigarettes  . Smokeless tobacco: Not on file  . Alcohol Use: No    Review of Systems  All other systems reviewed and are negative.   Allergies  Other  Home Medications   Prior to Admission medications   Medication Sig Start Date End Date Taking? Authorizing Provider  docusate sodium (COLACE) 50 MG capsule Take by mouth 3 (three) times daily as needed for mild constipation (constipation).     Historical Provider, MD  risperiDONE (RISPERDAL) 0.5 MG tablet Take 0.5 mg by mouth at bedtime.    Historical Provider, MD  traZODone (DESYREL) 50 MG tablet Take 50 mg by mouth at bedtime.    Historical Provider, MD   BP 134/63 mmHg  Pulse 102  Temp(Src) 98.2 F (36.8 C) (Oral)  Resp 12  Ht 5\' 9"  (1.753 m)  Wt 199 lb (90.266 kg)  BMI 29.37 kg/m2  SpO2 100% Physical Exam  Constitutional: He is oriented to person, place, and time. He appears  well-developed and well-nourished.  HENT:  Head: Normocephalic and atraumatic.  Eyes: Pupils are equal, round, and reactive to light.  Neck: Normal range of motion. Neck supple. No JVD present. No tracheal deviation present. No thyromegaly present.  Cardiovascular: Normal rate, regular rhythm, normal heart sounds and intact distal pulses.  Exam reveals no gallop and no friction rub.   No murmur heard. Pulmonary/Chest: Effort normal and breath sounds normal. No stridor. No respiratory distress. He has no wheezes. He has no rales. He exhibits no tenderness.  Abdominal: Soft. Bowel sounds are normal. He exhibits no distension and no mass. There is no  tenderness. There is no rebound and no guarding.  Genitourinary: Rectal exam shows no external hemorrhoid, no internal hemorrhoid, no fissure, no mass, no tenderness and anal tone normal. Guaiac positive stool.  Musculoskeletal: Normal range of motion.  Lymphadenopathy:    He has no cervical adenopathy.  Neurological: He is alert and oriented to person, place, and time. Coordination normal.  Skin: Skin is warm and dry.  Linear surgical scar lower back.  Psychiatric: He has a normal mood and affect. His behavior is normal. Judgment and thought content normal.  Nursing note and vitals reviewed.   ED Course  Procedures (including critical care time) Labs Review Labs Reviewed - No data to display  Imaging Review No results found.   EKG Interpretation   Date/Time:  Sunday February 13 2015 18:58:02 EDT Ventricular Rate:  89 PR Interval:  149 QRS Duration: 80 QT Interval:  353 QTC Calculation: 429 R Axis:   76 Text Interpretation:  Sinus rhythm Confirmed by Hazle Coca [phone]) on  02/13/2015 7:00:58 PM      MDM   Final diagnoses:  Upper GI bleed  Chronic back pain   Labs: I-STAT trop, CMP, CBC HGC 8.9 HCT 27.4, positive Hemoccult  Imaging: DG chest stable chronic findings of the left lung base no acute cardiopulmonary processes  Consults: Medicine  Therapeutics: None  Assessment: Anemia  Plan: Patient presents with chronic back pain with worsening fatigue and ability to walk. This is likely a combination of his progressing back pain with the addition of anemia. He describes a history of black stools which likely represents a GI bleed,consistent with his change in HCT over the last 2 months. Medicine services consult and who agreed to admit for management of anemia and back pain. Patient remained stable throughout his stay here and agreed to further evaluation and management.      Okey Regal, PA-C 02/14/15 0150  Quintella Reichert, MD 02/14/15 [phone]

## 2015-02-13 NOTE — ED Notes (Signed)
Dr Arnoldo Morale at the bedside at this time.

## 2015-02-14 ENCOUNTER — Encounter (HOSPITAL_COMMUNITY): Payer: Self-pay | Admitting: *Deleted

## 2015-02-14 ENCOUNTER — Inpatient Hospital Stay (HOSPITAL_COMMUNITY): Payer: Medicare Other

## 2015-02-14 DIAGNOSIS — M5459 Other low back pain: Secondary | ICD-10-CM | POA: Diagnosis present

## 2015-02-14 DIAGNOSIS — C61 Malignant neoplasm of prostate: Secondary | ICD-10-CM | POA: Diagnosis present

## 2015-02-14 DIAGNOSIS — M545 Low back pain, unspecified: Secondary | ICD-10-CM | POA: Insufficient documentation

## 2015-02-14 DIAGNOSIS — G8929 Other chronic pain: Secondary | ICD-10-CM

## 2015-02-14 DIAGNOSIS — M549 Dorsalgia, unspecified: Secondary | ICD-10-CM

## 2015-02-14 DIAGNOSIS — Z8719 Personal history of other diseases of the digestive system: Secondary | ICD-10-CM | POA: Insufficient documentation

## 2015-02-14 DIAGNOSIS — K922 Gastrointestinal hemorrhage, unspecified: Secondary | ICD-10-CM

## 2015-02-14 DIAGNOSIS — R262 Difficulty in walking, not elsewhere classified: Secondary | ICD-10-CM | POA: Diagnosis present

## 2015-02-14 LAB — BASIC METABOLIC PANEL
Anion gap: 7 (ref 5–15)
BUN: 20 mg/dL (ref 6–23)
CHLORIDE: 107 mmol/L (ref 96–112)
CO2: 26 mmol/L (ref 19–32)
CREATININE: 0.92 mg/dL (ref 0.50–1.35)
Calcium: 8.6 mg/dL (ref 8.4–10.5)
GFR calc non Af Amer: 84 mL/min — ABNORMAL LOW (ref >90)
Glucose, Bld: 102 mg/dL — ABNORMAL HIGH (ref 70–99)
Potassium: 3.6 mmol/L (ref 3.5–5.1)
SODIUM: 140 mmol/L (ref 135–145)

## 2015-02-14 LAB — CBC
HCT: 28.2 % — ABNORMAL LOW (ref 39.0–52.0)
HEMATOCRIT: 26.4 % — AB (ref 39.0–52.0)
Hemoglobin: 8.6 g/dL — ABNORMAL LOW (ref 13.0–17.0)
Hemoglobin: 9 g/dL — ABNORMAL LOW (ref 13.0–17.0)
MCH: 28 pg (ref 26.0–34.0)
MCH: 28 pg (ref 26.0–34.0)
MCHC: 32.6 g/dL (ref 30.0–36.0)
MCHC: 31.9 g/dL (ref 30.0–36.0)
MCV: 86 fL (ref 78.0–100.0)
MCV: 87.9 fL (ref 78.0–100.0)
Platelets: 312 10*3/uL (ref 150–400)
Platelets: 318 10*3/uL (ref 150–400)
RBC: 3.07 MIL/uL — ABNORMAL LOW (ref 4.22–5.81)
RBC: 3.21 MIL/uL — AB (ref 4.22–5.81)
RDW: 14 % (ref 11.5–15.5)
RDW: 14.2 % (ref 11.5–15.5)
WBC: 4.9 10*3/uL (ref 4.0–10.5)
WBC: 4 10*3/uL (ref 4.0–10.5)

## 2015-02-14 LAB — ABO/RH: ABO/RH(D): A POS

## 2015-02-14 LAB — TYPE AND SCREEN
ABO/RH(D): A POS
Antibody Screen: NEGATIVE

## 2015-02-14 LAB — HEMOGLOBIN AND HEMATOCRIT, BLOOD
HEMATOCRIT: 26.4 % — AB (ref 39.0–52.0)
Hemoglobin: 8.6 g/dL — ABNORMAL LOW (ref 13.0–17.0)

## 2015-02-14 MED ORDER — ACETAMINOPHEN 650 MG RE SUPP: 650.0000 mg | Freq: Four times a day (QID) | RECTAL | Status: DC | PRN

## 2015-02-14 MED ORDER — DEXAMETHASONE SODIUM PHOSPHATE 4 MG/ML IJ SOLN
4.0000 mg | Freq: Two times a day (BID) | INTRAMUSCULAR | Status: DC
  Administered 2015-02-14 – 2015-02-15 (×3): 4 mg via INTRAVENOUS
  Filled 2015-02-14 (×4): qty 1

## 2015-02-14 MED ORDER — RISPERIDONE 1 MG PO TABS
1.0000 mg | ORAL_TABLET | Freq: Every day | ORAL | Status: DC
  Administered 2015-02-14 (×2): 1 mg via ORAL
  Filled 2015-02-14 (×3): qty 1

## 2015-02-14 MED ORDER — SODIUM CHLORIDE 0.9 % IV SOLN
INTRAVENOUS | Status: DC
  Administered 2015-02-14 – 2015-02-15 (×3): via INTRAVENOUS

## 2015-02-14 MED ORDER — SODIUM CHLORIDE 0.9 % IJ SOLN
3.0000 mL | Freq: Two times a day (BID) | INTRAMUSCULAR | Status: DC
  Administered 2015-02-14: 3 mL via INTRAVENOUS

## 2015-02-14 MED ORDER — PANTOPRAZOLE SODIUM 40 MG PO TBEC
40.0000 mg | DELAYED_RELEASE_TABLET | Freq: Two times a day (BID) | ORAL | Status: DC
  Administered 2015-02-14 – 2015-02-15 (×2): 40 mg via ORAL
  Filled 2015-02-14: qty 1

## 2015-02-14 MED ORDER — TRAZODONE HCL 50 MG PO TABS
50.0000 mg | ORAL_TABLET | Freq: Every day | ORAL | Status: DC
  Administered 2015-02-14 (×2): 50 mg via ORAL
  Filled 2015-02-14 (×3): qty 1

## 2015-02-14 MED ORDER — ONDANSETRON HCL 4 MG PO TABS: 4.0000 mg | ORAL_TABLET | Freq: Four times a day (QID) | ORAL | Status: DC | PRN

## 2015-02-14 MED ORDER — ACETAMINOPHEN 325 MG PO TABS: 650.0000 mg | ORAL_TABLET | Freq: Four times a day (QID) | ORAL | Status: DC | PRN

## 2015-02-14 MED ORDER — HYDROMORPHONE HCL 1 MG/ML IJ SOLN: 0.5000 mg | INTRAMUSCULAR | Status: DC | PRN

## 2015-02-14 MED ORDER — OXYCODONE HCL 5 MG PO TABS: 5.0000 mg | ORAL_TABLET | ORAL | Status: DC | PRN

## 2015-02-14 MED ORDER — DEXTROSE 5 % IV SOLN
500.0000 mg | Freq: Three times a day (TID) | INTRAVENOUS | Status: DC | PRN
  Filled 2015-02-14: qty 5

## 2015-02-14 MED ORDER — ALUM & MAG HYDROXIDE-SIMETH 200-200-20 MG/5ML PO SUSP: 30.0000 mL | Freq: Four times a day (QID) | ORAL | Status: DC | PRN

## 2015-02-14 MED ORDER — ONDANSETRON HCL 4 MG/2ML IJ SOLN: 4.0000 mg | Freq: Four times a day (QID) | INTRAMUSCULAR | Status: DC | PRN

## 2015-02-14 MED ORDER — PANTOPRAZOLE SODIUM 40 MG IV SOLR
40.0000 mg | Freq: Two times a day (BID) | INTRAVENOUS | Status: DC
  Administered 2015-02-14: 40 mg via INTRAVENOUS
  Filled 2015-02-14 (×2): qty 40

## 2015-02-14 NOTE — Progress Notes (Addendum)
Patient has not had any hematemesis or melena But has had melena over the last 6 months No significant recent weight loss  Acute on chronic back pain I have reviewed the MRI findings with the patient  Plan Anemia secondary to GI blood loss, NSAID use Continue with Protonix CBC every 12 hours, will transfuse for hemoglobin less than 7.0 or active bleeding Hold off on GI consultation and this can be done electively as an outpatient as the patient is hemodynamically stable  Intractable low back pain Continue with Dilaudid, Robaxin, Decadron IV for acute exacerbation with outpatient taper MRI of the L-spine does not show any acute cord compression, patient will need outpatient neurosurgery follow-up PT/OT  Anticipate discharge tomorrow
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
## 2015-02-14 NOTE — Evaluation (Signed)
Physical Therapy Evaluation Patient Details Name: Evan Charles MRN: [id number] DOB: May 29, 1946 Today's Date: 02/14/2015   History of Present Illness  Pt adm with severe back pain and GI bleed. PMH- chronic back pain, prostate CA  Clinical Impression  Pt admitted with above diagnosis. Pt currently with functional limitations due to the deficits listed below (see PT Problem List).  Pt will benefit from skilled PT to increase their independence and safety with mobility to allow discharge to the venue listed below.       Follow Up Recommendations SNF    Equipment Recommendations  None recommended by PT    Recommendations for Other Services       Precautions / Restrictions Precautions Precautions: Fall      Mobility  Bed Mobility Overal bed mobility: Needs Assistance Bed Mobility: Supine to Sit     Supine to sit: Min assist;HOB elevated     General bed mobility comments: Assist to bring trunk up and hips to EOB.  Transfers Overall transfer level: Needs assistance Equipment used: Rolling walker (2 wheeled) Transfers: Sit to/from Stand Sit to Stand: Min assist         General transfer comment: Assist to bring hips up. Pt with posterior lean.  Ambulation/Gait Ambulation/Gait assistance: Min assist Ambulation Distance (Feet): 100 Feet Assistive device: Rolling walker (2 wheeled) Gait Pattern/deviations: Step-through pattern;Decreased step length - right;Decreased step length - left;Shuffle;Trunk flexed Gait velocity: slow Gait velocity interpretation: Below normal speed for age/gender General Gait Details: Verbal cues to stand more erect.  Stairs            Wheelchair Mobility    Modified Rankin (Stroke Patients Only)       Balance Overall balance assessment: Needs assistance Sitting-balance support: No upper extremity supported;Feet supported Sitting balance-Leahy Scale: Good     Standing balance support: Bilateral upper extremity  supported Standing balance-Leahy Scale: Poor Standing balance comment: support of walker and min A                             Pertinent Vitals/Pain Pain Assessment: 0-10 Pain Score: 2  Pain Location: back Pain Intervention(s): Limited activity within patient's tolerance;Repositioned    Home Living Family/patient expects to be discharged to:: Private residence Living Arrangements: Alone Available Help at Discharge: Family Type of Home: House Home Access: Stairs to enter Entrance Stairs-Rails: Right Entrance Stairs-Number of Steps: 3-4 Home Layout: One level Home Equipment: Environmental consultant - 2 wheels      Prior Function Level of Independence: Independent               Hand Dominance        Extremity/Trunk Assessment   Upper Extremity Assessment: Generalized weakness           Lower Extremity Assessment: Generalized weakness         Communication   Communication: No difficulties  Cognition Arousal/Alertness: Awake/alert Behavior During Therapy: WFL for tasks assessed/performed Overall Cognitive Status: Within Functional Limits for tasks assessed                      General Comments      Exercises        Assessment/Plan    PT Assessment Patient needs continued PT services  PT Diagnosis Difficulty walking;Generalized weakness   PT Problem List Decreased strength;Decreased activity tolerance;Decreased balance;Decreased mobility;Decreased knowledge of use of DME  PT Treatment Interventions DME instruction;Balance training;Gait training;Functional mobility training;Patient/family education;Therapeutic  activities;Therapeutic exercise   PT Goals (Current goals can be found in the Care Plan section) Acute Rehab PT Goals Patient Stated Goal: Get stronger PT Goal Formulation: With patient Time For Goal Achievement: 02/21/15 Potential to Achieve Goals: Good    Frequency Min 3X/week   Barriers to discharge Decreased caregiver support       Co-evaluation               End of Session Equipment Utilized During Treatment: Gait belt Activity Tolerance: Patient tolerated treatment well Patient left: in chair;with call bell/phone within reach;with chair alarm set Nurse Communication: Mobility status         Time: 1200-1223 PT Time Calculation (min) (ACUTE ONLY): 23 min   Charges:   PT Evaluation $Initial PT Evaluation Tier I: 1 Procedure PT Treatments $Gait Training: 8-22 mins   PT G Codes:        Mirtie Bastyr 02-24-15, 12:48 PM  [employer] PT [phone]

## 2015-02-14 NOTE — Progress Notes (Signed)
Utilization review completed. Kaynan Klonowski, RN, BSN. 

## 2015-02-15 LAB — COMPREHENSIVE METABOLIC PANEL
ALBUMIN: 3.1 g/dL — AB (ref 3.5–5.2)
ALK PHOS: 49 U/L (ref 39–117)
ALT: 13 U/L (ref 0–53)
ANION GAP: 3 — AB (ref 5–15)
AST: 17 U/L (ref 0–37)
BUN: 13 mg/dL (ref 6–23)
CALCIUM: 8.6 mg/dL (ref 8.4–10.5)
CO2: 28 mmol/L (ref 19–32)
CREATININE: 0.84 mg/dL (ref 0.50–1.35)
Chloride: 109 mmol/L (ref 96–112)
GFR calc non Af Amer: 87 mL/min — ABNORMAL LOW (ref >90)
Glucose, Bld: 117 mg/dL — ABNORMAL HIGH (ref 70–99)
Potassium: 3.8 mmol/L (ref 3.5–5.1)
Sodium: 140 mmol/L (ref 135–145)
TOTAL PROTEIN: 5.7 g/dL — AB (ref 6.0–8.3)
Total Bilirubin: 0.7 mg/dL (ref 0.3–1.2)

## 2015-02-15 LAB — CBC
HCT: 28 % — ABNORMAL LOW (ref 39.0–52.0)
HEMATOCRIT: 27.3 % — AB (ref 39.0–52.0)
Hemoglobin: 8.8 g/dL — ABNORMAL LOW (ref 13.0–17.0)
Hemoglobin: 9.1 g/dL — ABNORMAL LOW (ref 13.0–17.0)
MCH: 27.6 pg (ref 26.0–34.0)
MCH: 27.8 pg (ref 26.0–34.0)
MCHC: 32.2 g/dL (ref 30.0–36.0)
MCHC: 32.5 g/dL (ref 30.0–36.0)
MCV: 85.6 fL (ref 78.0–100.0)
MCV: 85.6 fL (ref 78.0–100.0)
PLATELETS: 337 10*3/uL (ref 150–400)
Platelets: 370 10*3/uL (ref 150–400)
RBC: 3.19 MIL/uL — ABNORMAL LOW (ref 4.22–5.81)
RBC: 3.27 MIL/uL — ABNORMAL LOW (ref 4.22–5.81)
RDW: 14 % (ref 11.5–15.5)
RDW: 14.1 % (ref 11.5–15.5)
WBC: 5.2 10*3/uL (ref 4.0–10.5)
WBC: 8.4 10*3/uL (ref 4.0–10.5)

## 2015-02-15 MED ORDER — PANTOPRAZOLE SODIUM 40 MG PO TBEC: 40.0000 mg | DELAYED_RELEASE_TABLET | Freq: Two times a day (BID) | ORAL | Status: DC

## 2015-02-15 MED ORDER — METHOCARBAMOL 500 MG PO TABS: 500.0000 mg | ORAL_TABLET | Freq: Three times a day (TID) | ORAL | Status: DC | PRN

## 2015-02-15 MED ORDER — OXYCODONE HCL 5 MG PO TABS: 5.0000 mg | ORAL_TABLET | Freq: Four times a day (QID) | ORAL | Status: DC | PRN

## 2015-02-15 MED ORDER — PREDNISONE 20 MG PO TABS: 40.0000 mg | ORAL_TABLET | Freq: Every day | ORAL | Status: AC

## 2015-02-15 NOTE — Clinical Social Work Note (Signed)
CSW was notified that patient needs SNF placement. Patient admitted from home but will DC to SNF once medically stable. Patient is specifically requesting [facility]. CSW has complete FL2, PASRR, and has faxed over information to [facility]. CSW unable to complete full psychosocial assessment due to short notice of patient's discharge needs and discharge date. Per MD patient ready for DC to [facility] RN, patient, patient's family (Patient has notified his family), and facility notified of DC. RN given number for report. DC packet on chart. [facility] transport requested for patient. CSW signing off.    Liz Beach MSW, [place], [place], [phone]

## 2015-02-15 NOTE — Progress Notes (Signed)
CARE MANAGEMENT NOTE 02/15/2015  Patient:  DRESEAN, BECKEL   Account Number:  [id number]  Date Initiated:  02/15/2015  Documentation initiated by:  [facility]  Subjective/Objective Assessment:   upper GI blood loss     Action/Plan:   Anticipated DC Date:  02/15/2015   Anticipated DC Plan:  SKILLED NURSING FACILITY  In-house referral  Clinical Social Worker      DC Planning Services  CM consult      Choice offered to / List presented to:             Status of service:  Completed, signed off Medicare Important Message given?  NA - LOS <3 / Initial given by admissions (If response is "NO", the following Medicare IM given date fields will be blank) Date Medicare IM given:   Medicare IM given by:   Date Additional Medicare IM given:   Additional Medicare IM given by:    Discharge Disposition:  [place]  Per UR Regulation:    If discussed at Long Length of Stay Meetings, dates discussed:    Comments:  02/15/2015 1155 Chart reviewed. Jonnie Finner RN CCM Case Mgmt phone [phone]

## 2015-02-15 NOTE — Progress Notes (Signed)
Report given to SNF.
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
## 2015-04-14 DIAGNOSIS — G8929 Other chronic pain: Secondary | ICD-10-CM | POA: Diagnosis not present

## 2015-04-14 DIAGNOSIS — F060 Psychotic disorder with hallucinations due to known physiological condition: Secondary | ICD-10-CM | POA: Diagnosis not present

## 2015-04-14 DIAGNOSIS — K219 Gastro-esophageal reflux disease without esophagitis: Secondary | ICD-10-CM | POA: Diagnosis not present

## 2015-04-14 DIAGNOSIS — M545 Low back pain: Secondary | ICD-10-CM | POA: Diagnosis not present

## 2015-04-14 DIAGNOSIS — Z9181 History of falling: Secondary | ICD-10-CM | POA: Diagnosis not present

## 2015-04-14 DIAGNOSIS — C61 Malignant neoplasm of prostate: Secondary | ICD-10-CM | POA: Diagnosis not present

## 2015-04-14 DIAGNOSIS — G609 Hereditary and idiopathic neuropathy, unspecified: Secondary | ICD-10-CM | POA: Diagnosis not present

## 2015-04-15 DIAGNOSIS — G609 Hereditary and idiopathic neuropathy, unspecified: Secondary | ICD-10-CM | POA: Diagnosis not present

## 2015-04-15 DIAGNOSIS — K219 Gastro-esophageal reflux disease without esophagitis: Secondary | ICD-10-CM | POA: Diagnosis not present

## 2015-04-15 DIAGNOSIS — M545 Low back pain: Secondary | ICD-10-CM | POA: Diagnosis not present

## 2015-04-15 DIAGNOSIS — F060 Psychotic disorder with hallucinations due to known physiological condition: Secondary | ICD-10-CM | POA: Diagnosis not present

## 2015-04-15 DIAGNOSIS — C61 Malignant neoplasm of prostate: Secondary | ICD-10-CM | POA: Diagnosis not present

## 2015-04-15 DIAGNOSIS — G8929 Other chronic pain: Secondary | ICD-10-CM | POA: Diagnosis not present

## 2015-04-15 DIAGNOSIS — Z9181 History of falling: Secondary | ICD-10-CM | POA: Diagnosis not present

## 2015-04-28 DIAGNOSIS — K219 Gastro-esophageal reflux disease without esophagitis: Secondary | ICD-10-CM | POA: Diagnosis not present

## 2015-04-28 DIAGNOSIS — M545 Low back pain: Secondary | ICD-10-CM | POA: Diagnosis not present

## 2015-04-28 DIAGNOSIS — F060 Psychotic disorder with hallucinations due to known physiological condition: Secondary | ICD-10-CM | POA: Diagnosis not present

## 2015-04-28 DIAGNOSIS — G8929 Other chronic pain: Secondary | ICD-10-CM | POA: Diagnosis not present

## 2015-04-28 DIAGNOSIS — Z7689 Persons encountering health services in other specified circumstances: Secondary | ICD-10-CM | POA: Diagnosis not present

## 2015-04-28 DIAGNOSIS — Z9181 History of falling: Secondary | ICD-10-CM | POA: Diagnosis not present

## 2015-04-28 DIAGNOSIS — C61 Malignant neoplasm of prostate: Secondary | ICD-10-CM | POA: Diagnosis not present

## 2015-04-28 DIAGNOSIS — G609 Hereditary and idiopathic neuropathy, unspecified: Secondary | ICD-10-CM | POA: Diagnosis not present

## 2015-06-29 DIAGNOSIS — Z96651 Presence of right artificial knee joint: Secondary | ICD-10-CM | POA: Diagnosis not present

## 2015-06-29 DIAGNOSIS — Z471 Aftercare following joint replacement surgery: Secondary | ICD-10-CM | POA: Diagnosis not present

## 2015-12-11 ENCOUNTER — Emergency Department (HOSPITAL_COMMUNITY): Payer: Medicare Other

## 2015-12-11 ENCOUNTER — Emergency Department (HOSPITAL_COMMUNITY)
Admission: EM | Admit: 2015-12-11 | Discharge: 2015-12-16 | Disposition: A | Discharge status: Other Acute Inpatient Hospital | Payer: Medicare Other | Attending: Emergency Medicine | Admitting: Emergency Medicine

## 2015-12-11 ENCOUNTER — Encounter (HOSPITAL_COMMUNITY): Payer: Self-pay | Admitting: Emergency Medicine

## 2015-12-11 DIAGNOSIS — Z87891 Personal history of nicotine dependence: Secondary | ICD-10-CM | POA: Insufficient documentation

## 2015-12-11 DIAGNOSIS — R21 Rash and other nonspecific skin eruption: Secondary | ICD-10-CM | POA: Insufficient documentation

## 2015-12-11 DIAGNOSIS — F0391 Unspecified dementia with behavioral disturbance: Secondary | ICD-10-CM | POA: Diagnosis not present

## 2015-12-11 DIAGNOSIS — R4182 Altered mental status, unspecified: Secondary | ICD-10-CM | POA: Diagnosis not present

## 2015-12-11 DIAGNOSIS — Z8669 Personal history of other diseases of the nervous system and sense organs: Secondary | ICD-10-CM | POA: Diagnosis not present

## 2015-12-11 DIAGNOSIS — K219 Gastro-esophageal reflux disease without esophagitis: Secondary | ICD-10-CM | POA: Insufficient documentation

## 2015-12-11 DIAGNOSIS — Z79899 Other long term (current) drug therapy: Secondary | ICD-10-CM | POA: Insufficient documentation

## 2015-12-11 DIAGNOSIS — R443 Hallucinations, unspecified: Secondary | ICD-10-CM | POA: Insufficient documentation

## 2015-12-11 DIAGNOSIS — Z8546 Personal history of malignant neoplasm of prostate: Secondary | ICD-10-CM | POA: Insufficient documentation

## 2015-12-11 DIAGNOSIS — S0990XA Unspecified injury of head, initial encounter: Secondary | ICD-10-CM | POA: Diagnosis not present

## 2015-12-11 DIAGNOSIS — F1997 Other psychoactive substance use, unspecified with psychoactive substance-induced persisting dementia: Secondary | ICD-10-CM | POA: Diagnosis not present

## 2015-12-11 DIAGNOSIS — R441 Visual hallucinations: Secondary | ICD-10-CM | POA: Diagnosis present

## 2015-12-11 DIAGNOSIS — R51 Headache: Secondary | ICD-10-CM | POA: Diagnosis not present

## 2015-12-11 LAB — COMPREHENSIVE METABOLIC PANEL
ALBUMIN: 4.1 g/dL (ref 3.5–5.0)
ALK PHOS: 55 U/L (ref 38–126)
ALT: 17 U/L (ref 17–63)
ANION GAP: 12 (ref 5–15)
AST: 35 U/L (ref 15–41)
BILIRUBIN TOTAL: 0.7 mg/dL (ref 0.3–1.2)
BUN: 16 mg/dL (ref 6–20)
CALCIUM: 9.3 mg/dL (ref 8.9–10.3)
CO2: 25 mmol/L (ref 22–32)
Chloride: 104 mmol/L (ref 101–111)
Creatinine, Ser: 0.8 mg/dL (ref 0.61–1.24)
GLUCOSE: 98 mg/dL (ref 65–99)
Potassium: 3.6 mmol/L (ref 3.5–5.1)
Sodium: 141 mmol/L (ref 135–145)
TOTAL PROTEIN: 7 g/dL (ref 6.5–8.1)

## 2015-12-11 LAB — URINE MICROSCOPIC-ADD ON

## 2015-12-11 LAB — I-STAT CG4 LACTIC ACID, ED
LACTIC ACID, VENOUS: 1.46 mmol/L (ref 0.5–2.0)
Lactic Acid, Venous: 1.22 mmol/L (ref 0.5–2.0)

## 2015-12-11 LAB — BRAIN NATRIURETIC PEPTIDE: B Natriuretic Peptide: 15.4 pg/mL (ref 0.0–100.0)

## 2015-12-11 LAB — CBC
HCT: 37 % — ABNORMAL LOW (ref 39.0–52.0)
HEMOGLOBIN: 11.7 g/dL — AB (ref 13.0–17.0)
MCH: 25.6 pg — ABNORMAL LOW (ref 26.0–34.0)
MCHC: 31.6 g/dL (ref 30.0–36.0)
MCV: 81 fL (ref 78.0–100.0)
Platelets: 334 10*3/uL (ref 150–400)
RBC: 4.57 MIL/uL (ref 4.22–5.81)
RDW: 16.2 % — ABNORMAL HIGH (ref 11.5–15.5)
WBC: 5.1 10*3/uL (ref 4.0–10.5)

## 2015-12-11 LAB — CBG MONITORING, ED: GLUCOSE-CAPILLARY: 90 mg/dL (ref 65–99)

## 2015-12-11 LAB — URINALYSIS, ROUTINE W REFLEX MICROSCOPIC
Bilirubin Urine: NEGATIVE
GLUCOSE, UA: NEGATIVE mg/dL
HGB URINE DIPSTICK: NEGATIVE
Ketones, ur: 15 mg/dL — AB
LEUKOCYTES UA: NEGATIVE
Nitrite: NEGATIVE
PROTEIN: 30 mg/dL — AB
Specific Gravity, Urine: 1.029 (ref 1.005–1.030)
pH: 7 (ref 5.0–8.0)

## 2015-12-11 MED ORDER — RISPERIDONE 0.5 MG PO TABS
0.5000 mg | ORAL_TABLET | Freq: Once | ORAL | Status: AC
  Administered 2015-12-11: 0.5 mg via ORAL
  Filled 2015-12-11: qty 1

## 2015-12-11 MED ORDER — DIPHENHYDRAMINE HCL 25 MG PO CAPS
25.0000 mg | ORAL_CAPSULE | Freq: Once | ORAL | Status: AC
  Administered 2015-12-11: 25 mg via ORAL
  Filled 2015-12-11: qty 1

## 2015-12-11 MED ORDER — LORAZEPAM 1 MG PO TABS
0.5000 mg | ORAL_TABLET | Freq: Once | ORAL | Status: AC
  Administered 2015-12-11: 0.5 mg via ORAL
  Filled 2015-12-11: qty 1

## 2015-12-11 NOTE — ED Notes (Signed)
Staffing Office aware of need for sitter. 
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
## 2015-12-11 NOTE — ED Notes (Signed)
Pt. Is aware of needing an urine specimen 

## 2015-12-11 NOTE — ED Notes (Signed)
Pt was dropped off by his nephew.  This RN did not speak with nephew.  Pt reports he is seen at [facility].  Believes he fell out of a window while at church falling between 10-15 ft.  States he has been falling a lot.  Pitting edema noted to bilateral legs.  Pt confused.  NAD, alert.

## 2015-12-11 NOTE — BH Assessment (Addendum)
Tele Assessment Note   Evan Charles is an 70 y.o. male who arrived to [facility] with complaints of hallucinations. He reported that he called his nephew and asked to be brought into the ED because " I needed to go back to [place]." Pt reported upon entering to the ED that he had fallen 10-3ft from a church window earlier today. Pt arrived to the hospital by stretcher and was unable to do anything to care for himself. He was assisted by 4 staff members to position himself onto the stretcher. Pt reports that " People are there to hurt me and when I tell them to leave , they won't:"  Pt lives lone and states he does not feel safe at home by himself. Pt is subject to bizarre and incoherent statements related to auditory and visual hallucinations. Pt reports seeing aliens that live in his closet. Pt described "them" as a 70  y.o. Very attractive young ladies who go into his closet and get stuff out because they have no respect for anyone." Pt states "they" don't talk to him but he knows what they are trying to do (?). Pt expressed HI towards the "aliens and states " I just want to hurt them. Kill them because they just keep taking my stuff like they can."   [name] states the hallucinations began three days ago and have been progressing.   Pt's son-in-law noticed that he'd left bunch of knives laying out on the floor of his home had been saying that he's been falling out of windows when he has no signs of trauma. He also has been seeing people that aren't there and saying that they're trying to hurt him. Pt reports he has three daughters but had difficulty recalling the name of his third daughter. Pt denies SI  But actively endorses AVH. Pt also states that is is becoming more difficult to care for himself at home and has expressed that he needs assistance to help him to get dressed. Pt appeared confused during the interview and was unable to coherently complete the assessment questionnaire. Pt labs do not  indicate and recent illness or head trauma consistent with pt's current condition.  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 Back pain   . Colitis   . Prostate cancer ([place])   . GERD (gastroesophageal reflux disease)   . Neuropathy ([place])     " MY HANDS "    Past Surgical History  Procedure Laterality Date  . Prostatectomy    . Laminectomy    . Knee arthroscopy      Family History:  Family History  Problem Relation Age of Onset  . Cancer Brother   . Cancer Maternal Grandmother     Social History:  reports that he has quit smoking. His smoking use included Cigarettes. He has never used smokeless tobacco. He reports that he does not drink alcohol or use illicit drugs.  Additional Social History:  Alcohol / Drug Use History of alcohol / drug use?: No history of alcohol / drug abuse  CIWA: CIWA-Ar BP: 140/79 mmHg Pulse Rate: 95 COWS:    PATIENT STRENGTHS: (choose at least two) Supportive family/friends  Allergies:  Allergies  Allergen Reactions  . Other Rash    Tomatoes cause a rash    Home Medications:  (Not in a hospital admission)  OB/GYN Status:  No  LMP for male patient.  General Assessment Data Location of Assessment: [facility] ED TTS Assessment: In system Is this a Tele or Face-to-Face Assessment?: Tele Assessment Is this an Initial Assessment or a Re-assessment for this encounter?: Initial Assessment Marital status: Divorced Is patient pregnant?: No Pregnancy Status: No Living Arrangements: Alone Can pt return to current living arrangement?: No (Pt appears incoherent and may have difficulty caring for him) Admission Status: Voluntary Is patient capable of signing voluntary admission?: No Referral Source: Self/Family/Friend Insurance type: UTA  Medical Screening Exam ([place]) Medical Exam completed: Yes  Crisis Care  Plan Living Arrangements: Alone Name of Psychiatrist: [place] Name of Therapist: UTA  Education Status Is patient currently in school?: No Current Grade: UTA Highest grade of school patient has completed: College Name of school: UTA Contact person: UTA  Risk to self with the past 6 months Suicidal Ideation: No (Pt denies) Has patient been a risk to self within the past 6 months prior to admission? : No Suicidal Intent: No Has patient had any suicidal intent within the past 6 months prior to admission? : No Is patient at risk for suicide?: No Suicidal Plan?: No Has patient had any suicidal plan within the past 6 months prior to admission? : No Access to Means: No What has been your use of drugs/alcohol within the last 12 months?: UTA Previous Attempts/Gestures: No Other Self Harm Risks: UTA Triggers for Past Attempts: Unknown Intentional Self Injurious Behavior: None (UTA) Family Suicide History: Unable to assess Recent stressful life event(s):  (UTA) Persecutory voices/beliefs?: Yes ("They want to talk to me but they can't.") Depression: Yes Depression Symptoms: Despondent Substance abuse history and/or treatment for substance abuse?: No Suicide prevention information given to non-admitted patients: Not applicable  Risk to Others within the past 6 months Homicidal Ideation: Yes-Currently Present ("I want to kill them aliens" ) Does patient have any lifetime risk of violence toward others beyond the six months prior to admission? : Unknown Thoughts of Harm to Others: Yes-Currently Present Comment - Thoughts of Harm to Others: "I want to kill them aliens."  Current Homicidal Intent: Yes-Currently Present (Passive) Current Homicidal Plan: No Access to Homicidal Means: No Identified Victim: Aliens History of harm to others?: No (Unknown) Assessment of Violence: None Noted Violent Behavior Description: " I want to kill them aliens that are in my closet."  Does patient have access to  weapons?: No Criminal Charges Pending?: No (UTA) Does patient have a court date: No Is patient on probation?: Unknown (UTA)  Psychosis Hallucinations: Auditory, Visual Delusions: Persecutory  Mental Status Report Appearance/Hygiene: In scrubs Eye Contact: Poor Motor Activity: Rigidity Speech: Incoherent, Slow, Slurred Level of Consciousness: Quiet/awake Mood: Sullen Affect: Inconsistent with thought content Anxiety Level: None Thought Processes: Irrelevant, Tangential Judgement: Impaired Orientation: Not oriented Obsessive Compulsive Thoughts/Behaviors: Severe (obsessed with aliens)  Cognitive Functioning Concentration: Decreased Memory: Recent Impaired, Remote Impaired IQ: Average Insight: Poor Impulse Control: Poor Appetite: Good Weight Loss:  (None reported) Weight Gain:  (None reported) Sleep: Unable to Assess Total Hours of Sleep:  (UTA) Vegetative Symptoms: None  ADLScreening [facility] Assessment Services) Patient's cognitive ability adequate to safely complete daily activities?: No Patient able to express need for assistance with ADLs?: Yes Independently performs ADLs?: No  Prior Inpatient Therapy Prior Inpatient Therapy: No (UTA) Prior Therapy Dates:  (UTA) Prior Therapy Facilty/Provider(s):  (UTA) Reason for Treatment:  (UTA)  Prior Outpatient Therapy Prior Outpatient Therapy: No (UTA) Prior Therapy Dates:  (UTA) Prior Therapy Facilty/Provider(s):  (UTA)  Reason for Treatment:  (UTA) Does patient have an ACCT team?: Unknown Does patient have Intensive In-House Services?  : Unknown Does patient have [facility] services? : Unknown Does patient have P4CC services?: Unknown  ADL Screening (condition at time of admission) Patient's cognitive ability adequate to safely complete daily activities?: No Is the patient deaf or have difficulty hearing?: No Does the patient have difficulty seeing, even when wearing glasses/contacts?: No Does the patient have difficulty  concentrating, remembering, or making decisions?: Yes Patient able to express need for assistance with ADLs?: Yes Does the patient have difficulty dressing or bathing?: Yes Independently performs ADLs?: No Communication: Independent, Needs assistance Is this a change from baseline?: Pre-admission baseline Dressing (OT): Needs assistance Grooming: Needs assistance Is this a change from baseline?: Pre-admission baseline Feeding: Independent Bathing: Independent Toileting: Independent In/Out Bed: Needs assistance Is this a change from baseline?: Pre-admission baseline Walks in Home: Independent Does the patient have difficulty walking or climbing stairs?: No     Therapy Consults (therapy consults require a physician order) PT Evaluation Needed: Yes (Comment) OT Evalulation Needed: No SLP Evaluation Needed: No Abuse/Neglect Assessment (Assessment to be complete while patient is alone) Physical Abuse:  (UTA) Verbal Abuse:  (UTA) Sexual Abuse:  (UTa) Exploitation of patient/patient's resources:  Special educational needs teacher) Self-Neglect: Denies, provider concerned (Comment) Possible abuse reported to::  (UTA) Values / Beliefs Cultural Requests During Hospitalization:  (UTA) Spiritual Requests During Hospitalization:  (UTA) [place] Needed: No Social Work Consult Needed: Yes (Comment) Advance Directives (For Healthcare) Does patient have an advance directive?: No Would patient like information on creating an advanced directive?: No - patient declined information    Additional Information 1:1 In Past 12 Months?: No CIRT Risk: No Elopement Risk: Yes Does patient have medical clearance?: No     Disposition: Recommendation for Geriatric Inpatient per acceptance to [facility] - per Elmarie Shiley, DNP Disposition Initial Assessment Completed for this Encounter: Yes Disposition of Patient: Referred to [facility] - per Elmarie Shiley,  DNP  Violette Morneault 12/11/2015 4:43 PM

## 2015-12-11 NOTE — ED Notes (Signed)
Pt arrived to C22 via stretcher. Pt unable to ambulate to bed so moved to bed by staff. Pt alert, oriented to self only. Pt noted w/[facility] - states he had a tray of food and should have eaten it when it was there. Pt's [name] [id number] - declined to take pt's belongings w/him. Stated he had called pt's daughter Harriett Sine [phone] x 2 days ago and advised he was concerned d/t unable to locate pt. States found out pt had been to [employer]. States also spoke w/pt's other daughter Hebert Soho - [phone] a few moments ago and advised her pt is here - states she advised she may come see pt this evening. Pod C Pamphlet given to nephew - verbalized understanding of policy.

## 2015-12-11 NOTE — ED Notes (Signed)
Pt attempting to get out bed. Pt placed back in bed sitter remains at bedside.

## 2015-12-11 NOTE — ED Notes (Signed)
TTS at bedside. 

## 2015-12-11 NOTE — ED Notes (Signed)
Pt. Arrived back to the room E. 38,. Pt. Was unable to do anything for himself . He required 4 people to position him into the stretcher.  He reports that he fell out of a [employer] laboratory technician.  He reports that he lives by himself.  His nephew by marriage helps the pt.  He is scared to live in his home.  When you ask him if he is safe at his home , he stated, "No"  People are there to hurt me and when I tell them to leave , they won't:"  Pt.s nephew reports that he lives by himself

## 2015-12-11 NOTE — ED Notes (Signed)
Pharmacy Tech called pt's daughter Hebert Soho - [phone] - she advised she does not know the name of pt's meds nor does she have a key to his house to be able to get them. Advised she will come visit w/pt this evening around 2130 - RN approved - d/t so she may verify pt's behavior. Pharmacy Tech advised will call [facility] in am so may attempt to verify pt's meds.

## 2015-12-11 NOTE — ED Notes (Signed)
Patient refused night snack.

## 2015-12-11 NOTE — ED Notes (Signed)
RN asked family to take pt's belongings. Family refused stating "I don't want to be held responsible for no money."

## 2015-12-11 NOTE — ED Notes (Signed)
Pt to receive meal tray.

## 2015-12-11 NOTE — ED Notes (Signed)
Sitter arrived to bedside.  

## 2015-12-11 NOTE — ED Notes (Signed)
Pt states "do you know the name of the woman who makes my tangerine?" RN states she wasn't sure. "Oh that makes e feel real like I'm gonna eat hers anyway."

## 2015-12-12 LAB — RPR: RPR Ser Ql: NONREACTIVE

## 2015-12-12 MED ORDER — VITAMIN D 1000 UNITS PO TABS
1000.0000 [IU] | ORAL_TABLET | Freq: Every day | ORAL | Status: DC
  Administered 2015-12-13 – 2015-12-16 (×4): 1000 [IU] via ORAL
  Filled 2015-12-12 (×4): qty 1

## 2015-12-12 MED ORDER — NAPROXEN 250 MG PO TABS
500.0000 mg | ORAL_TABLET | Freq: Two times a day (BID) | ORAL | Status: DC
  Administered 2015-12-13 – 2015-12-16 (×7): 500 mg via ORAL
  Filled 2015-12-12 (×7): qty 2

## 2015-12-12 MED ORDER — ARIPIPRAZOLE 10 MG PO TABS
10.0000 mg | ORAL_TABLET | Freq: Every day | ORAL | Status: DC
Start: 2015-12-12 — End: 2015-12-16
  Administered 2015-12-13 – 2015-12-16 (×4): 10 mg via ORAL
  Filled 2015-12-12 (×4): qty 1

## 2015-12-12 MED ORDER — PANTOPRAZOLE SODIUM 40 MG PO TBEC
40.0000 mg | DELAYED_RELEASE_TABLET | Freq: Two times a day (BID) | ORAL | Status: DC
  Administered 2015-12-13 – 2015-12-16 (×6): 40 mg via ORAL
  Filled 2015-12-12 (×7): qty 1

## 2015-12-12 MED ORDER — FUROSEMIDE 20 MG PO TABS
40.0000 mg | ORAL_TABLET | Freq: Every day | ORAL | Status: DC
  Administered 2015-12-13 – 2015-12-16 (×4): 40 mg via ORAL
  Filled 2015-12-12 (×4): qty 2

## 2015-12-12 MED ORDER — UREA 20 % EX CREA: 1.0000 "application " | TOPICAL_CREAM | Freq: Every day | CUTANEOUS | Status: DC

## 2015-12-12 MED ORDER — HYDROCERIN EX CREA
TOPICAL_CREAM | CUTANEOUS | Status: DC | PRN
  Filled 2015-12-12: qty 113

## 2015-12-12 MED ORDER — TRAMADOL HCL 50 MG PO TABS
100.0000 mg | ORAL_TABLET | Freq: Three times a day (TID) | ORAL | Status: DC
  Administered 2015-12-13 – 2015-12-16 (×8): 100 mg via ORAL
  Filled 2015-12-12 (×9): qty 2

## 2015-12-12 MED ORDER — OLANZAPINE 5 MG PO TABS
5.0000 mg | ORAL_TABLET | Freq: Every day | ORAL | Status: DC
  Administered 2015-12-14 – 2015-12-15 (×2): 5 mg via ORAL
  Filled 2015-12-12 (×3): qty 1

## 2015-12-12 MED ORDER — DOCUSATE SODIUM 50 MG PO CAPS
50.0000 mg | ORAL_CAPSULE | Freq: Three times a day (TID) | ORAL | Status: DC | PRN
  Filled 2015-12-12: qty 1

## 2015-12-12 MED ORDER — TRAZODONE HCL 50 MG PO TABS
50.0000 mg | ORAL_TABLET | Freq: Every day | ORAL | Status: DC
  Administered 2015-12-14 – 2015-12-15 (×2): 50 mg via ORAL
  Filled 2015-12-12 (×3): qty 1

## 2015-12-12 MED ORDER — LORAZEPAM 1 MG PO TABS
1.0000 mg | ORAL_TABLET | Freq: Once | ORAL | Status: AC
  Administered 2015-12-12: 1 mg via ORAL
  Filled 2015-12-12: qty 1

## 2015-12-12 NOTE — ED Notes (Signed)
Pt clapping and calling his dog in room at this time.

## 2015-12-12 NOTE — Progress Notes (Addendum)
13:45 update: VA referral completed and faxed to [facility] for review.   Mayer Camel called to state pt's referral is being reviewed and had questions re: pt's need for assistance with ambulation. Per ED, pt's family reported pt uses walker at home, however he has not used one in ED and needs assistance ambulating.   [facility] states they are reviewing pt "he seems appropriate for admission." Expresses concern that "if pt is to be admitted voluntarily he would not be able to comprehend the situation enough to sign himself in and consent for treatment seeing as he is disoriented." States will call back pending admitting MD decision.    Sharren Bridge, MSW, LCSW Clinical Social Work, Disposition  12/12/2015 [phone]    Discussed pt's case with psych team. Psych NP and Dr Dwyane Dee recommend seeking gero-psych inpatient treatment. Pt can be re-evaluated by psych team 12/13/15 a.m. For follow up if needed.   Pt has VA benefits as well as [employer] MCR coverage.  Jeanett Schlein, transfer coordinator at [facility], reports all VAs except [place] are on psych diversion. Took pt's name and CSW contact number in case bed opens up, he will call so referral can be made to [place].  CSW spoke with Benard Halsted, transfer coordinator at [facility], who confirms there is a possibility of bed openings today. Requests transfer packet be faxed to [phone]. CSW completed VA referral form and faxed 2 pages to ED for EDP, pt, and witness to sign, and to be returned to [place] [facility] [place]. Will send complete referral to [facility] when paperwork available.  Mr. Candis Schatz at [facility] states that, since pt has [name] coverage as well, pt can also be referred to [place].  Referred to: [facility]- referral sent yesterday per notes. Left voicemail inquiring as to bed status and status of referral. [facility]- send referral per Reina Fuse- send referral per [facility]-  send referral per [facility]- per Jenny Reichmann  Declined: [facility]- due to needing assistance with ADLs- not able to accommodate this level of medical need. [facility]- per Silva Bandy, due to acuity of pt's symptoms being too high for unit  Sharren Bridge, MSW, [place] Work, Disposition  12/12/2015 [phone]

## 2015-12-12 NOTE — ED Notes (Signed)
MD james at bedside with patient.

## 2015-12-12 NOTE — Progress Notes (Signed)
Writer has been informed by pt's RN Carmelina Paddock that patient is for morning eval for memory care long term facility and that IVC can not be completed at the moment. Intake Butch Penny at [facility] (geriatric) was informed and stated that [facility] will wait for updates in the am.  Verlon Setting, [place] Disposition staff 12/12/2015 4:45 PM

## 2015-12-12 NOTE — ED Notes (Signed)
Lunch severed.

## 2015-12-12 NOTE — ED Notes (Signed)
Patient given medication with applesauce. Patient keeps trying to get out of bed to get his dog. Patient states, Come here, let me go get here."

## 2015-12-12 NOTE — ED Notes (Signed)
Patient is alert but only oriented to self. Patient does not recognize his daughter states he does not know who she is. Patient in bed asking for his dog.

## 2015-12-12 NOTE — ED Notes (Signed)
Daughter at bedside with the patient.

## 2015-12-12 NOTE — Progress Notes (Signed)
Pt for IP behavioral health vs memory care placement, noted. Per nursing, pt is total care and requires SNF at d/c. PT/OT evals pending, noted, as well as psych c/s.  CSW has completed FL2 and will pursue placement accordingly.  Day shift CSW to f/u.

## 2015-12-12 NOTE — ED Notes (Addendum)
Sitter was given hygiene items and clean scrubs for patient to give him a bed bath.

## 2015-12-12 NOTE — ED Notes (Signed)
Dinner served

## 2015-12-12 NOTE — BHH Counselor (Signed)
TC from [place] at [facility] who reports they are still reviewing patient.  Arnold Long, [place] Therapeutic Triage Specialist

## 2015-12-12 NOTE — NC FL2 (Signed)
[place] LEVEL OF CARE SCREENING TOOL     IDENTIFICATION  Patient Name: Evan Charles Birthdate: 01-17-1946 Sex: male Admission Date (Current Location): 12/11/2015  [facility] and [place] Number:  Herbalist and Address:  [facility], [place] [address], [place], [place] [address]      Provider Number: [id number]  Attending Physician Name and Address:  Provider Default, MD  Relative Name and Phone Number:       Current Level of Care: Hospital Recommended Level of Care: [place] Prior Approval Number:    Date Approved/Denied:   PASRR Number:    Discharge Plan: SNF    Current Diagnoses: Patient Active Problem List   Diagnosis Date Noted  . Intractable low back pain 02/14/2015  . Difficulty walking 02/14/2015  . Prostate cancer ([place]) 02/14/2015  . Upper GI bleed   . Chronic back pain   . GI bleed 02/13/2015  . Visual hallucinations   . Hallucinations 11/02/2014  . Psychoses     Orientation RESPIRATION BLADDER Height & Weight     Self  Normal Incontinent Weight:   Height:     BEHAVIORAL SYMPTOMS/MOOD NEUROLOGICAL BOWEL NUTRITION STATUS      Incontinent    AMBULATORY STATUS COMMUNICATION OF NEEDS Skin   Extensive Assist Verbally  (rash on soles and palms)                       Personal Care Assistance Level of Assistance  Total care Bathing Assistance: Maximum assistance Feeding assistance: Maximum assistance Dressing Assistance: Maximum assistance Total Care Assistance: Maximum assistance   Functional Limitations Info             SPECIAL CARE FACTORS FREQUENCY  PT (By licensed PT), OT (By licensed OT)                    Contractures Contractures Info: Not present    Additional Factors Info  Psychotropic               Current Medications (12/12/2015):  This is the current hospital active medication list Current Facility-Administered Medications  Medication Dose Route  Frequency Provider Last Rate Last Dose  . ARIPiprazole (ABILIFY) tablet 10 mg  10 mg Oral Daily Tanna Furry, MD      . cholecalciferol (VITAMIN D) tablet 1,000 Units  1,000 Units Oral Daily Tanna Furry, MD      . docusate sodium (COLACE) capsule 50 mg  50 mg Oral TID PRN Tanna Furry, MD      . furosemide (LASIX) tablet 40 mg  40 mg Oral Daily Tanna Furry, MD      . Derrill Memo ON 12/13/2015] naproxen (NAPROSYN) tablet 500 mg  500 mg Oral BID WC Tanna Furry, MD      . OLANZapine [facility]) tablet 5 mg  5 mg Oral QHS Tanna Furry, MD      . pantoprazole (PROTONIX) EC tablet 40 mg  40 mg Oral BID Tanna Furry, MD      . traMADol Veatrice Bourbon) tablet 100 mg  100 mg Oral TID Tanna Furry, MD      . traZODone (DESYREL) tablet 50 mg  50 mg Oral QHS Tanna Furry, MD      . urea (CARMOL) 20 % cream 1 application  1 application Topical Daily Tanna Furry, MD       Current Outpatient Prescriptions  Medication Sig Dispense Refill  . ARIPiprazole (ABILIFY) 20 MG  tablet Take 10 mg by mouth daily.     . cholecalciferol (VITAMIN D) 1000 units tablet Take 1,000 Units by mouth daily.    . furosemide (LASIX) 40 MG tablet Take 40 mg by mouth daily.    . naproxen (NAPROSYN) 500 MG tablet Take 500 mg by mouth 2 (two) times daily with a meal.    . OLANZapine (ZYPREXA) 5 MG tablet Take 5 mg by mouth at bedtime.    . pantoprazole (PROTONIX) 40 MG tablet Take 1 tablet (40 mg total) by mouth 2 (two) times daily. 60 tablet 2  . traMADol (ULTRAM) 50 MG tablet Take 100 mg by mouth 3 (three) times daily.     . traZODone (DESYREL) 50 MG tablet Take 50 mg by mouth at bedtime.    . urea (CARMOL) 20 % cream Apply 1 application topically daily.    Marland Kitchen docusate sodium (COLACE) 50 MG capsule Take 50 mg by mouth 3 (three) times daily as needed for mild constipation.     . methocarbamol (ROBAXIN) 500 MG tablet Take 1 tablet (500 mg total) by mouth every 8 (eight) hours as needed for muscle spasms. (Patient not taking: Reported on 12/12/2015) 30 tablet 0  .  oxyCODONE (OXY IR/ROXICODONE) 5 MG immediate release tablet Take 1 tablet (5 mg total) by mouth every 6 (six) hours as needed for moderate pain. (Patient not taking: Reported on 12/12/2015) 30 tablet 0     Discharge Medications: Please see discharge summary for a list of discharge medications.  Relevant Imaging Results:  Relevant Lab Results:   Additional Information    Terressa Evola M, LCSW

## 2015-12-12 NOTE — ED Notes (Signed)
Spoke with Anderson Malta at [facility] regarding placement.

## 2015-12-12 NOTE — ED Provider Notes (Signed)
Met with, interviewed, and examined patient. I reviewed all of patient's notes since his arrival in the emergency room.  He has poor short-term memory. Difficult to staying on task. Is confused. Does not appear to be hallucinating. However, nursing states at times he will be talking to his dog which he has at home.  Is calm. Not agitated. Attempts to follow conversation. Patient's exam and history are most consistent with that of a chronic worsening dementia. No evidence for acute delirium thought disorder at this time. Would qualify for IVC only by being gravely disabled by his condition. 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 I have asked case management/social work to also pursue memory care facility.  Tanna Furry, MD 12/12/15 [phone]

## 2015-12-12 NOTE — ED Notes (Signed)
Pt continues to stir in bed, calling out for dog [facility].

## 2015-12-13 ENCOUNTER — Encounter (HOSPITAL_COMMUNITY): Payer: Self-pay

## 2015-12-13 DIAGNOSIS — F0391 Unspecified dementia with behavioral disturbance: Secondary | ICD-10-CM | POA: Diagnosis not present

## 2015-12-13 DIAGNOSIS — R441 Visual hallucinations: Secondary | ICD-10-CM | POA: Diagnosis not present

## 2015-12-13 DIAGNOSIS — F1997 Other psychoactive substance use, unspecified with psychoactive substance-induced persisting dementia: Secondary | ICD-10-CM | POA: Diagnosis not present

## 2015-12-13 NOTE — Progress Notes (Addendum)
Cedric at [facility] geriatric unit has been updated that patient can not go to [place] due to recent MD recommendation for a SNF facility.   Verlon Setting, [place] Disposition staff 12/13/2015 3:54 PM

## 2015-12-13 NOTE — ED Notes (Signed)
Patient has two visitors at bedside.

## 2015-12-13 NOTE — Consult Note (Signed)
[facility] Psychiatry Consult   Reason for Consult:  De\mentia with visual hallucinations and questionable compliance with medications Referring Physician:  EDP Patient Identification: Evan Charles MRN:  [id number] Principal Diagnosis: Visual hallucinations Diagnosis:   Patient Active Problem List   Diagnosis Date Noted  . Intractable low back pain [M54.5] 02/14/2015  . Difficulty walking [R26.2] 02/14/2015  . Prostate cancer ([place]) [C61] 02/14/2015  . Upper GI bleed [K92.2]   . Chronic back pain [M54.9, G89.29]   . GI bleed [K92.2] 02/13/2015  . Visual hallucinations [R44.1]   . Hallucinations [R44.3] 11/02/2014  . Psychoses [F29]     Total Time spent with patient: 1 hour  Subjective:   Evan Charles is a 70 y.o. male patient admitted with hallucinations.  HPI:  Evan Charles is an 70 y.o. male seen and chart reviewed and case discussed with staff RN and LCSW. Patient has been suffering with dementia and recently increased visual hallucinations and required admission to [facility]. He has been staying by himself and his family members closely watching for him. Now he is not able to care for him and unsteady on his feet. He is a poor historian but can tell his name and responds to his name when called. He has endorses visual hallucinations but does not appear to be irritable, agitated or aggressive. He may not be able to take his scheduled medication and questionable roaming around without purpose. He is not steady on his feet and needs urinals on his bed as per staff. He denied suicide or homicide ideations. He has no depression, mania or auditory hallucinations or paranoia.  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 He was taken medication treatment from [place] in the past and he was a English as a second language teacher.    Risk to Self: Suicidal Ideation: No (Pt denies) Suicidal  Intent: No Is patient at risk for suicide?: No Suicidal Plan?: No Access to Means: No What has been your use of drugs/alcohol within the last 12 months?: UTA Other Self Harm Risks: UTA Triggers for Past Attempts: Unknown Intentional Self Injurious Behavior: None (UTA) Risk to Others: Homicidal Ideation: Yes-Currently Present ("I want to kill them aliens" ) Thoughts of Harm to Others: Yes-Currently Present Comment - Thoughts of Harm to Others: "I want to kill them aliens."  Current Homicidal Intent: Yes-Currently Present (Passive) Current Homicidal Plan: No Access to Homicidal Means: No Identified Victim: Aliens History of harm to others?: No (Unknown) Assessment of Violence: None Noted Violent Behavior Description: " I want to kill them aliens that are in my closet."  Does patient have access to weapons?: No Criminal Charges Pending?: No (UTA) Does patient have a court date: No Prior Inpatient Therapy: Prior Inpatient Therapy: No (UTA) Prior Therapy Dates:  (UTA) Prior Therapy Facilty/Provider(s):  (UTA) Reason for Treatment:  (UTA) Prior Outpatient Therapy: Prior Outpatient Therapy: No (UTA) Prior Therapy Dates:  (UTA) Prior Therapy Facilty/Provider(s):  (UTA) Reason for Treatment:  (UTA) Does patient have an ACCT team?: Unknown Does patient have Intensive In-House Services?  : Unknown Does patient have [facility] services? : Unknown Does patient have P4CC services?: Unknown  Past Medical History:  Past Medical History  Diagnosis Date  . Back pain   . Colitis   . Prostate cancer ([place])   . GERD (gastroesophageal reflux disease)   . Neuropathy ([place])     " MY HANDS "    Past Surgical History  Procedure Laterality Date  . Prostatectomy    . Laminectomy    . Knee arthroscopy     Family History:  Family History  Problem Relation Age of Onset  . Cancer Brother   . Cancer Maternal Grandmother    Family Psychiatric  History: Patient mother suffered with dementia.  Social  History:  History  Alcohol Use No     History  Drug Use No    Social History   Social History  . Marital Status: Single    Spouse Name: N/A  . Number of Children: N/A  . Years of Education: N/A   Social History Main Topics  . Smoking status: Former Smoker    Types: Cigarettes  . Smokeless tobacco: Never Used  . Alcohol Use: No  . Drug Use: No  . Sexual Activity: Not Asked   Other Topics Concern  . None   Social History Narrative   Additional Social History:    History of alcohol / drug use?: No history of alcohol / drug abuse                     Allergies:   Allergies  Allergen Reactions  . Other Rash    Tomatoes cause a rash    Labs:  Results for orders placed or performed during the hospital encounter of 12/11/15 (from the past 48 hour(s))  Comprehensive metabolic panel     Status: None   Collection Time: 12/11/15  1:03 PM  Result Value Ref Range   Sodium 141 135 - 145 mmol/L   Potassium 3.6 3.5 - 5.1 mmol/L   Chloride 104 101 - 111 mmol/L   CO2 25 22 - 32 mmol/L   Glucose, Bld 98 65 - 99 mg/dL   BUN 16 6 - 20 mg/dL   Creatinine, Ser 0.80 0.61 - 1.24 mg/dL   Calcium 9.3 8.9 - 10.3 mg/dL   Total Protein 7.0 6.5 - 8.1 g/dL   Albumin 4.1 3.5 - 5.0 g/dL   AST 35 15 - 41 U/L   ALT 17 17 - 63 U/L   Alkaline Phosphatase 55 38 - 126 U/L   Total Bilirubin 0.7 0.3 - 1.2 mg/dL   GFR calc non Af Amer >60 >60 mL/min   GFR calc Af Amer >60 >60 mL/min    Comment: (NOTE) The eGFR has been calculated using the CKD EPI equation. This calculation has not been validated in all clinical situations. eGFR's persistently <60 mL/min signify possible Chronic Kidney Disease.    Anion gap 12 5 - 15  CBC     Status: Abnormal   Collection Time: 12/11/15  1:03 PM  Result Value Ref Range   WBC 5.1 4.0 - 10.5 K/uL   RBC 4.57 4.22 - 5.81 MIL/uL   Hemoglobin 11.7 (L) 13.0 - 17.0 g/dL   HCT 37.0 (L) 39.0 - 52.0 %   MCV 81.0 78.0 - 100.0 fL   MCH 25.6 (L) 26.0 - 34.0  pg   MCHC 31.6 30.0 - 36.0 g/dL   RDW 16.2 (H) 11.5 - 15.5 %   Platelets 334 150 - 400 K/uL  CBG monitoring, ED     Status: None   Collection Time: 12/11/15  1:03 PM  Result Value Ref Range   Glucose-Capillary 90 65 - 99 mg/dL   Comment 1 Notify RN   I-Stat CG4 Lactic Acid, ED     Status: None   Collection Time: 12/11/15  1:17 PM  Result Value Ref  Range   Lactic Acid, Venous 1.22 0.5 - 2.0 mmol/L  RPR     Status: None   Collection Time: 12/11/15  2:24 PM  Result Value Ref Range   RPR Ser Ql Non Reactive Non Reactive    Comment: (NOTE) Performed At: [facility] [address] [place], [place] [id number] Lindon Romp MD [id number]   Brain natriuretic peptide     Status: None   Collection Time: 12/11/15  2:24 PM  Result Value Ref Range   B Natriuretic Peptide 15.4 0.0 - 100.0 pg/mL  Urinalysis, Routine w reflex microscopic (not at [facility])     Status: Abnormal   Collection Time: 12/11/15  3:40 PM  Result Value Ref Range   Color, Urine YELLOW YELLOW   APPearance CLEAR CLEAR   Specific Gravity, Urine 1.029 1.005 - 1.030   pH 7.0 5.0 - 8.0   Glucose, UA NEGATIVE NEGATIVE mg/dL   Hgb urine dipstick NEGATIVE NEGATIVE   Bilirubin Urine NEGATIVE NEGATIVE   Ketones, ur 15 (A) NEGATIVE mg/dL   Protein, ur 30 (A) NEGATIVE mg/dL   Nitrite NEGATIVE NEGATIVE   Leukocytes, UA NEGATIVE NEGATIVE  Urine microscopic-add on     Status: Abnormal   Collection Time: 12/11/15  3:40 PM  Result Value Ref Range   Squamous Epithelial / LPF 0-5 (A) NONE SEEN   WBC, UA 0-5 0 - 5 WBC/hpf   RBC / HPF 0-5 0 - 5 RBC/hpf   Bacteria, UA RARE (A) NONE SEEN  I-Stat CG4 Lactic Acid, ED     Status: None   Collection Time: 12/11/15  4:17 PM  Result Value Ref Range   Lactic Acid, Venous 1.46 0.5 - 2.0 mmol/L    Current Facility-Administered Medications  Medication Dose Route Frequency Provider Last Rate Last Dose  . ARIPiprazole (ABILIFY) tablet 10 mg  10 mg Oral Daily Tanna Furry, MD   10 mg  at 12/13/15 1017  . cholecalciferol (VITAMIN D) tablet 1,000 Units  1,000 Units Oral Daily Tanna Furry, MD   1,000 Units at 12/13/15 0913  . docusate sodium (COLACE) capsule 50 mg  50 mg Oral TID PRN Tanna Furry, MD      . furosemide (LASIX) tablet 40 mg  40 mg Oral Daily Tanna Furry, MD   40 mg at 12/13/15 0913  . hydrocerin (EUCERIN) cream   Topical PRN Rozann Lesches, RPH      . naproxen (NAPROSYN) tablet 500 mg  500 mg Oral BID WC Tanna Furry, MD   500 mg at 12/13/15 0913  . OLANZapine (ZYPREXA) tablet 5 mg  5 mg Oral QHS Tanna Furry, MD   5 mg at 12/13/15 0829  . pantoprazole (PROTONIX) EC tablet 40 mg  40 mg Oral BID Tanna Furry, MD   40 mg at 12/13/15 0914  . traMADol (ULTRAM) tablet 100 mg  100 mg Oral TID Tanna Furry, MD   100 mg at 12/13/15 0913  . traZODone (DESYREL) tablet 50 mg  50 mg Oral QHS Tanna Furry, MD   50 mg at 12/13/15 0830   Current Outpatient Prescriptions  Medication Sig Dispense Refill  . ARIPiprazole (ABILIFY) 20 MG tablet Take 10 mg by mouth daily.     . cholecalciferol (VITAMIN D) 1000 units tablet Take 1,000 Units by mouth daily.    . furosemide (LASIX) 40 MG tablet Take 40 mg by mouth daily.    . naproxen (NAPROSYN) 500 MG tablet Take 500 mg by mouth 2 (two) times daily  with a meal.    . OLANZapine (ZYPREXA) 5 MG tablet Take 5 mg by mouth at bedtime.    . pantoprazole (PROTONIX) 40 MG tablet Take 1 tablet (40 mg total) by mouth 2 (two) times daily. 60 tablet 2  . traMADol (ULTRAM) 50 MG tablet Take 100 mg by mouth 3 (three) times daily.     . traZODone (DESYREL) 50 MG tablet Take 50 mg by mouth at bedtime.    . urea (CARMOL) 20 % cream Apply 1 application topically daily.    Marland Kitchen docusate sodium (COLACE) 50 MG capsule Take 50 mg by mouth 3 (three) times daily as needed for mild constipation.     . methocarbamol (ROBAXIN) 500 MG tablet Take 1 tablet (500 mg total) by mouth every 8 (eight) hours as needed for muscle spasms. (Patient not taking: Reported on 12/12/2015) 30  tablet 0  . oxyCODONE (OXY IR/ROXICODONE) 5 MG immediate release tablet Take 1 tablet (5 mg total) by mouth every 6 (six) hours as needed for moderate pain. (Patient not taking: Reported on 12/12/2015) 30 tablet 0    Musculoskeletal: Strength & Muscle Tone: decreased Gait & Station: unable to stand Patient leans: N/A  Psychiatric Specialty Exam: ROS poor reporter due to cognitive decline and physical decline over the years. No Fever-chills, No Headache, No changes with Vision or hearing, reports vertigo No problems swallowing food or Liquids, No Chest pain, Cough or Shortness of Breath, No Abdominal pain, No Nausea or Vommitting, Bowel movements are regular, No Blood in stool or Urine, No dysuria, No new skin rashes or bruises, No new joints pains-aches,  No new weakness, tingling, numbness in any extremity, No recent weight gain or loss, No polyuria, polydypsia or polyphagia,   A full 10 point Review of Systems was done, except as stated above, all other Review of Systems were negative.  Blood pressure 145/95, pulse 104, temperature 98.1 F (36.7 C), temperature source Oral, resp. rate 18, SpO2 97 %.There is no weight on file to calculate BMI.  General Appearance: Bizarre  Eye Contact::  Fair  Speech:  Slow and Slurred  Volume:  Decreased  Mood:  Anxious  Affect:  Inappropriate  Thought Process:  Irrelevant and Loose  Orientation:  Other:  to his name and DOB.  Thought Content:  Hallucinations: Visual and Rumination  Suicidal Thoughts:  No  Homicidal Thoughts:  No  Memory:  Immediate;   Fair Recent;   Poor  Judgement:  Impaired  Insight:  Shallow  Psychomotor Activity:  Decreased  Concentration:  Poor  Recall:  Poor  Fund of Knowledge:NA  Language: Fair  Akathisia:  Negative  Handed:  Right  AIMS (if indicated):     Assets:  Agricultural consultant Housing Leisure Time Resilience Social Support Transportation  ADL's:  Impaired   Cognition: Impaired,  Moderate  Sleep:      Treatment Plan Summary: Daily contact with patient to assess and evaluate symptoms and progress in treatment and Medication management  Reviewed labs and current medication regimen and concurred with treatment No medication changes recommended Safety sitter to prevent falls only, no suicide or homicide concerns.  Disposition: No evidence of imminent risk to self or others at present.   Supportive therapy provided about ongoing stressors. Patient will be benifit from a SNF with memory unit as he has recently deteriortated with his ADL's and unable to care for himself.  Family agree with the disposition plan  Evan Charles,JANARDHAHA R. 12/13/2015 10:40 AM

## 2015-12-13 NOTE — Progress Notes (Signed)
Received call from [facility] at [facility] that pt is pending psychiatrist evaluation this morning for determination of inpatient psych v. Memory care/skilled nursing recommendation. Also for determination of pt meeting criteria for IVC.  Cayla states they will hold pt's bed until recommendation is made today. Requested updated vitals and MAR from today- CSW faxed to her.  Will follow up with Rosana Hoes pending re-evaluation today.  Sharren Bridge, MSW, LCSW Clinical Social Work, Disposition  12/13/2015 [phone]

## 2015-12-13 NOTE — Progress Notes (Signed)
Physical Therapy Evaluation  Clinical Impression: Pt admitted with altered mental status. Pt currently with functional limitations due to the deficits listed below (see PT Problem List). No family immediately available to confirm history however, pt reports he was independent with a cane for mobility at home prior to admission. He reports having multiple falls at home in the past week. Today, he demonstrates difficulty following commands, is oriented to self only, and has a significant posterior lean with excessive postural extension with mobility. Unable to safely ambulate and required max assistance to stand. Pt will benefit from skilled PT to increase their independence and safety with mobility to allow discharge to the venue listed below.      12/13/15 0800  PT Visit Information  Last PT Received On 12/13/15  Assistance Needed +2  History of Present Illness 70 y.o. male admitted for hallucinations and altered mental status. PMH of back pain, colitis, prostate cancer, neuropathy, and GERD.  Precautions  Precautions Fall  Restrictions  Weight Bearing Restrictions No  Home Living  Family/patient expects to be discharged to: Unsure  Living Arrangements Alone (Cares for his dogs)  Available Help at Discharge (Per pt. No help at home)  Type of [place] to enter  Entrance Stairs-Number of Steps 3-4  Entrance Stairs-Rails Right  Home Layout One level  [place] - 2 wheels;Cane - single point  Additional Comments Patient is a poor historian with questionable answers. Some information obtained from previous history during prior admission.  Prior Function  Level of Independence Independent with assistive device(s)  Comments Pt states he was ambulatory in the house with a cane but has had multiple falls in the past week. Reports he cares for his dogs. Of note: pt is a questionable historian and no family was immediately available.  Communication  Communication  No difficulties  Pain Assessment  Pain Assessment 0-10  Pain Score ("It's always sore" no value given)  Pain Location back  Pain Descriptors / Indicators Sore  Pain Intervention(s) Monitored during session;Repositioned  Cognition  Arousal/Alertness Awake/alert  Behavior During Therapy Restless  Overall Cognitive Status No family/caregiver present to determine baseline cognitive functioning  Area of Impairment Orientation;Following commands;Safety/judgement;Problem solving  Orientation Level Disoriented to;Place;Time;Situation  Following Commands Follows one step commands inconsistently  Safety/Judgement Decreased awareness of safety;Decreased awareness of deficits  Problem Solving Decreased initiation;Difficulty sequencing;Requires verbal cues;Requires tactile cues  Upper Extremity Assessment  Upper Extremity Assessment Defer to OT evaluation  Lower Extremity Assessment  Lower Extremity Assessment LLE deficits/detail;Difficult to assess due to impaired cognition  LLE Deficits / Details 4 beat ankle clonus with dorsiflexion.  Bed Mobility  Overal bed mobility Needs Assistance  Bed Mobility Rolling;Sidelying to Sit;Sit to Sidelying  Rolling Min assist  Sidelying to sit Mod assist  Sit to sidelying Min assist  General bed mobility comments Min assist to roll onto right side with guidance for LEs, mod assist for truncal support and max VC for technique to rise to EOB. Very rigid with posterior lean and LE extension throughout. Min assist for LE support back to sidelying position. Cues throughout for sequencing.  Transfers  Overall transfer level Needs assistance  Equipment used 1 person hand held assist (Holding back of chair for support with 2nd hand)  Transfers Sit to/from Stand  Sit to Stand Max assist  General transfer comment First attempt pt leaning heavily to posterior with aggressive LE and truncal extension. Able to perform second time with Max assist for boost and  balance.  Once upright, required mod assist for balance with 1 UE support from PT and other hand on back of chair. Still with heavy posterior lean. Cues for LE and UE placement to rise, with Lt knee/foot block to prevent from sliding forward with push off.  Ambulation/Gait  General Gait Details Unable to safely perform this date due to posterior loss of balance.  Modified Rankin (Stroke Patients Only)  Pre-Morbid Rankin Score 3  Modified Rankin 5  Balance  Overall balance assessment Needs assistance  Sitting-balance support Single extremity supported;Feet supported  Sitting balance-Leahy Scale Poor  Postural control Posterior lean  Standing balance support Single extremity supported  Standing balance-Leahy Scale Poor  Standing balance comment Stood for several   General Comments  General comments (skin integrity, edema, etc.) Pt whistling for his dog when PT entered room. Difficulty maintaining task at hand. Easily distracted with brief period of lucid conversation.  PT - End of Session  Equipment Utilized During Treatment Gait belt  Activity Tolerance Other (comment) (Limited by poor cognition)  Patient left in bed;with call bell/phone within reach;with nursing/sitter in room  Nurse Communication Mobility status  PT Assessment  PT Therapy Diagnosis  Difficulty walking;Altered mental status  PT Recommendation/Assessment Patient needs continued PT services  PT Problem List Decreased range of motion;Decreased activity tolerance;Decreased balance;Decreased mobility;Decreased coordination;Decreased cognition;Decreased knowledge of use of DME;Decreased safety awareness;Decreased knowledge of precautions;Impaired tone;Pain  Barriers to Discharge Decreased caregiver support  Barriers to Discharge Comments lives alone  PT Plan  PT Frequency (ACUTE ONLY) Min 2X/week  PT Treatment/Interventions (ACUTE ONLY) DME instruction;Gait training;Functional mobility training;Therapeutic activities;Therapeutic  exercise;Balance training;Neuromuscular re-education;Cognitive remediation;Patient/family education  PT Recommendation  Follow Up Recommendations SNF-memory unit (Noted plan for possible memory care unit vs inpatient behavioral health - If placed in either of these settings he would greatly benefit from physical therapy) Patient is not safe for d/c to home.  PT equipment None recommended by PT  Individuals Consulted  Consulted and Agree with Results and Recommendations Patient unable/family or caregiver not available  Acute Rehab PT Goals  Patient Stated Goal None stated  PT Goal Formulation Patient unable to participate in goal setting  Time For Goal Achievement 12/27/15  Potential to Achieve Goals Fair  PT Time Calculation  PT Start Time (ACUTE ONLY) [id number]  PT Stop Time (ACUTE ONLY) 0848  PT Time Calculation (min) (ACUTE ONLY) 10 min  PT G-Codes **NOT FOR INPATIENT CLASS**  Functional Assessment Tool Used clinical observation  Functional Limitation Changing and maintaining body position  Changing and Maintaining Body Position Current Status [id number]) CK  Changing and Maintaining Body Position Goal Status [id number]) CJ  PT General Charges  $$ ACUTE PT VISIT 1 Procedure  PT Evaluation  $PT Eval Moderate Complexity 1 Procedure    Camille Bal [place], [place]

## 2015-12-13 NOTE — ED Notes (Signed)
Patient medications given with applesauce.

## 2015-12-13 NOTE — ED Notes (Addendum)
Pt did not receive his lunch tray - svc response aware and is re-ordering.

## 2015-12-13 NOTE — ED Notes (Signed)
Patient lunch Tray given.

## 2015-12-13 NOTE — ED Notes (Signed)
Deacon from [facility] called asking about visiting hours - advised - voiced understanding.

## 2015-12-13 NOTE — ED Notes (Signed)
CSW spoke with pt's daughter, Evan Charles re: pt's disposition.  Tia confirms that pt was living alone PTA with intermittent visits by family.  Per Tia, there is absolutely no one who can provide pt with 24 hr care and no one, including pt can afford to hire 24 hr care.  Tia states that she is the primary contact for pt and that there is no POA.  Tia would like to be called with updates. Per nursing, pt remains total care.  CSW to proceed with placement.

## 2015-12-13 NOTE — Progress Notes (Signed)
CSW has initiated SNF bed serach.  Await bed offers.

## 2015-12-13 NOTE — ED Notes (Signed)
Pt's family members x 4 visiting w/pt.

## 2015-12-13 NOTE — ED Notes (Signed)
Pt took meds whole in apple sauce w/o any difficulty. Sitter feeding pt cake.

## 2015-12-13 NOTE — ED Notes (Signed)
Pharmacy called and advised Abilify stilled needed. Unable to pull from pyxis

## 2015-12-13 NOTE — ED Notes (Signed)
PT in w/pt.  

## 2015-12-14 NOTE — ED Notes (Signed)
Patient was ordered a regular diet for lunch.

## 2015-12-14 NOTE — ED Notes (Signed)
Pt has a visitor. Called security, and Cecille Rubin, Therapist, sports, to verify that visitor can come back.

## 2015-12-14 NOTE — Progress Notes (Signed)
CSW continues to pursue placement on behalf of pt.  Extended conversation with his daughters, Hebert Soho and [name].  Neither daughter are able to provide pt with 24 hr care, and were unable to identify anyone who could. They deny having the financial means to pay privately for care and are unaware of pt's income or finances.  Tia had several questions re: POA and guardianship and questions were answered appropriately and referrals made.  Tia is going to come to ED to get pt's house keys so she can look through pt's belongings in order to get a better understanding of pt's finances/financial responsibilities.  Tia is aware that pt is behind on his mortgage and wants to make sure that this is kept current.  Family is in agreement with placement in a [place] facility/[facility] contracted NH and have been informed that placement will not be local is, indeed, the primary payor source. [facility] CSW has reached out to former [employer], and they are actively running pt's insurance to determine the primary payor source.  CSW has faxed referrals to [place] facilities and [facility]hift CSW will f/u with [facility] re: authorization in the am.  Creta Levin, Timonium Boyd [id number]

## 2015-12-14 NOTE — ED Notes (Signed)
Pt was cleaned up, regowned and transferred from chair to bed using assistive device.

## 2015-12-14 NOTE — Progress Notes (Signed)
CSW called Patient's daughter Hebert Soho ([phone]) regarding Patient's placement status. He has been declined by  [facility]  CSW informed Patient's daughter that she and her family need to meet to discuss the next steps as it is very likely that Patient will not be able to be placed from the Emergency Department at this time. CSW agreed to have 2nd Shift ED Social Worker follow up with Daughter to discuss POC. CSW will continue to follow for disposition.   Isac Sarna [facility] ED/ [place] Social Worker [phone]

## 2015-12-14 NOTE — ED Notes (Signed)
Snack provided

## 2015-12-14 NOTE — ED Notes (Signed)
Pt refused to have his temperature taken after awaking from sleep to have V/S taken.

## 2015-12-14 NOTE — ED Notes (Signed)
Spoke to Dr. Jeneen Rinks, D/Ced pt due to behaviors being under control.  Pt continues to sleep.

## 2015-12-14 NOTE — ED Notes (Signed)
Woke pt to take vitals and give medications.  He refused temp and meds.  RN offered pt drink and snack, pt refused.  Pt went back to sleep.

## 2015-12-14 NOTE — ED Notes (Signed)
Patient was given 2 cups of apple juice.

## 2015-12-14 NOTE — ED Notes (Signed)
Pt. oob to the recliner, gait is very unsteady. Assist of 2 needed.

## 2015-12-15 NOTE — ED Notes (Signed)
Patient given bath with full lien change by NT

## 2015-12-15 NOTE — Progress Notes (Signed)
CSW attempted all [place] facilities to follow up with referrals.   (val[facility]) [facility]- left VM with [facility]- 12/15/15 no [facility] beds available [place]- Did not receive fax on last night, CSW refaxed 12/15/15 [facility]- left VM Rosalia Hammers in admissions  [facility]- left message for admissions  CSW met with CSW Print production planner regarding Patient's disposition. Per Medical Director, CSW needs to speak with family about contacting the [place] facilities as well as their [facility] regarding denial of beds.  CSW met with Patient's daughter Hebert Soho and provided her a list with the [place] Facilities. Patient has been referred to all facilities. CSW informed Patient's daughter that Patient had been referred to all of the facilities with no acceptances at this time. CSW called [name] while with Tia to discuss disposition. Tia and [name] report that they will have to work something out. Per Tia, Patient could stay with [name] but does not think that [name] will want that. Tia reports that she currently has two children along with Michelle's child living in the home with her and does not even have anymore space for her father. Tia reports that she will talk to [name] in attempts to see if father can stay with her. CSW inquired about possible church members who may be willing to sit with him during the day while daughters are at work. Tia reports that she will look into that as well. CSW made it very clear that Patient has been discharged from the ED and a safe discharge plan needs to be made by tomorrow per Medical Director. CSW will continue to follow for disposition.   Holly Bodily, Latanya Presser [facility] ED Clinical Social Worker [phone]

## 2015-12-15 NOTE — ED Notes (Signed)
SW at beside with patients family.

## 2015-12-15 NOTE — ED Notes (Signed)
Dinner served

## 2015-12-15 NOTE — Progress Notes (Signed)
CSW has spoken with pt's daughter's several time this evening.  No bed offers as of yet.  Daughter [name] has contacted the [place] and left message for SW, as has this CSW.  CSW suggested that they consider exploring options for privately hired care givers or access pt's Aid and Attendance benefits through the [place], until [place] placement/authorization can be secured.  Pt's daughters, as well as CSW continue to actively work on pt's disposition.

## 2015-12-15 NOTE — Progress Notes (Signed)
OT cancellation /Discharge    12/15/15 1500  OT Visit Information  Last OT Received On 12/15/15  Reason Eval/Treat Not Completed Other (comment). Spoke with SW. Ot eval not needed for SNF admission. OT signing off.   [facility], OTR/L  [phone] 12/15/2015

## 2015-12-15 NOTE — ED Provider Notes (Signed)
Pt awake/alert He is watching TV He is in no distress Awaiting placement BP 100/52 mmHg  Pulse 95  Temp(Src) 98.2 F (36.8 C) (Oral)  Resp 20  SpO2 98%   Ripley Fraise, MD 12/15/15 1721

## 2015-12-15 NOTE — ED Notes (Signed)
Family at bedside. 

## 2015-12-15 NOTE — ED Notes (Signed)
Patient given a snack 

## 2015-12-15 NOTE — Progress Notes (Signed)
CSW received return phone call from Rod Holler at [facility]- she reports that they do not have any beds and are unable to make a bed offer at this time. CSW will continue to follow for disposition.   Holly Bodily, Latanya Presser [facility] ED Clinical Social Worker [phone]

## 2015-12-15 NOTE — ED Notes (Signed)
Patient given snack.  

## 2015-12-15 NOTE — ED Notes (Signed)
Patient given apple juice and phone to call family. Patient sitting in chair watching TV.

## 2015-12-15 NOTE — ED Notes (Signed)
All patient's belongings given to Daughter TIA . Charge RN [place] advised to give belongings to daughter.

## 2015-12-15 NOTE — ED Notes (Signed)
Lunch Tray delivered 

## 2015-12-16 DIAGNOSIS — Z8546 Personal history of malignant neoplasm of prostate: Secondary | ICD-10-CM | POA: Diagnosis not present

## 2015-12-16 DIAGNOSIS — Z87891 Personal history of nicotine dependence: Secondary | ICD-10-CM | POA: Diagnosis not present

## 2015-12-16 DIAGNOSIS — M549 Dorsalgia, unspecified: Secondary | ICD-10-CM | POA: Diagnosis not present

## 2015-12-16 DIAGNOSIS — M545 Low back pain: Secondary | ICD-10-CM | POA: Diagnosis not present

## 2015-12-16 DIAGNOSIS — Z8669 Personal history of other diseases of the nervous system and sense organs: Secondary | ICD-10-CM | POA: Diagnosis not present

## 2015-12-16 DIAGNOSIS — Z79899 Other long term (current) drug therapy: Secondary | ICD-10-CM | POA: Diagnosis not present

## 2015-12-16 DIAGNOSIS — R600 Localized edema: Secondary | ICD-10-CM | POA: Diagnosis not present

## 2015-12-16 DIAGNOSIS — R443 Hallucinations, unspecified: Secondary | ICD-10-CM | POA: Diagnosis not present

## 2015-12-16 DIAGNOSIS — K219 Gastro-esophageal reflux disease without esophagitis: Secondary | ICD-10-CM | POA: Diagnosis not present

## 2015-12-16 DIAGNOSIS — R21 Rash and other nonspecific skin eruption: Secondary | ICD-10-CM | POA: Diagnosis not present

## 2015-12-16 DIAGNOSIS — K922 Gastrointestinal hemorrhage, unspecified: Secondary | ICD-10-CM | POA: Diagnosis not present

## 2015-12-16 DIAGNOSIS — F29 Unspecified psychosis not due to a substance or known physiological condition: Secondary | ICD-10-CM | POA: Diagnosis not present

## 2015-12-16 MED ORDER — VITAMIN D 1000 UNITS PO TABS: 1000.0000 [IU] | ORAL_TABLET | Freq: Every day | ORAL | Status: DC

## 2015-12-16 MED ORDER — OLANZAPINE 5 MG PO TABS: 5.0000 mg | ORAL_TABLET | Freq: Every day | ORAL | Status: DC

## 2015-12-16 MED ORDER — TRAZODONE HCL 50 MG PO TABS: 50.0000 mg | ORAL_TABLET | Freq: Every day | ORAL | Status: DC

## 2015-12-16 MED ORDER — ARIPIPRAZOLE 20 MG PO TABS: 10.0000 mg | ORAL_TABLET | Freq: Every day | ORAL | Status: DC

## 2015-12-16 MED ORDER — PANTOPRAZOLE SODIUM 40 MG PO TBEC: 40.0000 mg | DELAYED_RELEASE_TABLET | Freq: Two times a day (BID) | ORAL | Status: DC

## 2015-12-16 MED ORDER — DOCUSATE SODIUM 50 MG PO CAPS: 50.0000 mg | ORAL_CAPSULE | Freq: Three times a day (TID) | ORAL | Status: DC | PRN

## 2015-12-16 NOTE — ED Provider Notes (Signed)
  Physical Exam  BP 125/79 mmHg  Pulse 84  Temp(Src) 98.7 F (37.1 C) (Oral)  Resp 18  SpO2 95%  Physical Exam  ED Course  Procedures  MDM Patient sleeping this AM. Pending nursing home placement. Holding ordered placed by previous provider. No issues per nursing   Wandra Arthurs, MD 12/16/15 [phone]

## 2015-12-16 NOTE — ED Provider Notes (Signed)
70 y.o. male being discharged to SNF, facility asking for written prescriptions. I was able to write for medications Pt received while in our care here, there will need to be further outpatient medication reconciliation.   Leo Grosser, MD 12/16/15 [phone]

## 2015-12-16 NOTE — Progress Notes (Signed)
Physical Therapy Treatment  Clinical Statement: Patient is making progress, less disoriented from previous physical therapy visit. Able to stand and ambulate short distance today with moderate assistance for significant posterior loss of balance. Pleasantly participated in balance tasks challenging his limits of stability. Patient is appropriate for SNF for continued skilled physical therapy to increase functional independence and reduce risk of falls.    12/16/15 1400  PT Visit Information  Last PT Received On 12/16/15  Assistance Needed +2  History of Present Illness 70 y.o. male admitted for hallucinations and altered mental status. PMH of back pain, colitis, prostate cancer, neuropathy, and GERD.  PT Time Calculation  PT Start Time (ACUTE ONLY) 1346  PT Stop Time (ACUTE ONLY) 1402  PT Time Calculation (min) (ACUTE ONLY) 16 min  Subjective Data  Subjective I don't remember you  Patient Stated Goal None stated  Precautions  Precautions Fall  Restrictions  Weight Bearing Restrictions No  Pain Assessment  Pain Assessment No/denies pain  Pain Intervention(s) Monitored during session  Cognition  Arousal/Alertness Awake/alert  Behavior During Therapy WFL for tasks assessed/performed  Overall Cognitive Status No family/caregiver present to determine baseline cognitive functioning  Area of Impairment Orientation;Following commands;Safety/judgement;Problem solving  Orientation Level Disoriented to;Time;Situation  Following Commands Follows one step commands inconsistently  Safety/Judgement Decreased awareness of safety;Decreased awareness of deficits  Problem Solving Decreased initiation;Difficulty sequencing;Requires verbal cues;Requires tactile cues  Bed Mobility  General bed mobility comments sitting in recliner  Transfers  Overall transfer level Needs assistance  Equipment used Rolling walker (2 wheeled)  Transfers Sit to/from Stand  Sit to Stand Mod assist  General transfer  comment Mod assist for boost to stand. Max VC and tactile cues for anterior weight shift, hand placement, and sequence of task to rise. Very heavy posterior lean but able to eventually stand unsupported for appoximately 15 seconds at a time. Poor control with descent, cues for hand placement and to bend LEs.  Ambulation/Gait  Ambulation/Gait assistance Mod assist  Ambulation Distance (Feet) 25 Feet  Assistive device Rolling walker (2 wheeled)  Gait Pattern/deviations Step-through pattern;Decreased stride length;Shuffle;Trunk flexed;Leaning posteriorly  General Gait Details Posterior loss of balance requiring mod assist to correct intermittently. Required assist for RW control. No buckling noted. Rigid posture with gait.  Gait velocity slow  Gait velocity interpretation <1.8 ft/sec, indicative of risk for recurrent falls  Modified Rankin (Stroke Patients Only)  Pre-Morbid Rankin Score 3  Modified Rankin 4  Balance  Standing balance support Single extremity supported  Standing balance-Leahy Scale Poor  Standing balance comment Stood for 10 minutes while practicing weight-shifting and finding center of gravity. Significant posterior lean but did progress to intermittent assist for balance correction. Performed reaching tasks anteriorly in various directions.  PT - End of Session  Equipment Utilized During Treatment Gait belt  Activity Tolerance Other (comment) (Limited by poor cognition)  Patient left with call bell/phone within reach;with nursing/sitter in room;in chair  Nurse Communication Mobility status  PT - Assessment/Plan  PT Plan Current plan remains appropriate  PT Frequency (ACUTE ONLY) Min 2X/week  Follow Up Recommendations SNF  PT equipment None recommended by PT  PT Goal Progression  Progress towards PT goals Progressing toward goals  Acute Rehab PT Goals  PT Goal Formulation Patient unable to participate in goal setting  Time For Goal Achievement 12/27/15  Potential to  Achieve Goals Fair  PT G-Codes **NOT FOR INPATIENT CLASS**  Functional Assessment Tool Used clinical observation  Functional Limitation Changing and maintaining body position  Changing and Maintaining Body Position Current Status [phone]) CK  Changing and Maintaining Body Position Goal Status [id number]) CJ  PT General Charges  $$ ACUTE PT VISIT 1 Procedure  PT Treatments  $Therapeutic Activity 8-22 mins    [address] [place], [place]

## 2015-12-16 NOTE — ED Notes (Signed)
Pt at bedside

## 2015-12-16 NOTE — Progress Notes (Signed)
After further consideration, Patient and family have decided on [facility] and Rehab. Gayla Medicus met with Patient at his bedside. CSW requested an updated PT consult per SNF request. Per, PT, Patient continues to need SNF placement. Tammy to contact CSW with further placement details.    Isac Sarna [facility] ED/ [place] Social Worker [phone]

## 2015-12-16 NOTE — Progress Notes (Signed)
Per Dorthula Rue and [place], Patient's primary insurance is [facility] Medicare with VA benefits as secondary. Pt. Is non-service connected veteran. CSW had registration switch insurances. CSW refaxed Patient to SNF facilities. Patient was accepted to [facility]. CSW called to inform daughters who are agreeable to plans at this time. Santiago Glad at [facility] will call CSW when bed is ready. CSW will continue to follow for disposition.    Isac Sarna [facility] ED/ [place] Social Worker [phone]

## 2015-12-16 NOTE — Progress Notes (Signed)
Pt has been accepted to [facility].  Facility requests written scripts for his medications, RN informed. Nurse to call report to [phone].  CSW to arrange non-emergent ambulance when paperwork is completed.

## 2015-12-19 ENCOUNTER — Encounter: Payer: Self-pay | Admitting: Adult Health

## 2015-12-19 ENCOUNTER — Non-Acute Institutional Stay (SKILLED_NURSING_FACILITY): Payer: Medicare Other | Admitting: Adult Health

## 2015-12-19 DIAGNOSIS — G8929 Other chronic pain: Secondary | ICD-10-CM | POA: Diagnosis not present

## 2015-12-19 DIAGNOSIS — R600 Localized edema: Secondary | ICD-10-CM | POA: Diagnosis not present

## 2015-12-19 DIAGNOSIS — K219 Gastro-esophageal reflux disease without esophagitis: Secondary | ICD-10-CM | POA: Insufficient documentation

## 2015-12-19 DIAGNOSIS — R443 Hallucinations, unspecified: Secondary | ICD-10-CM

## 2015-12-19 DIAGNOSIS — M549 Dorsalgia, unspecified: Secondary | ICD-10-CM

## 2015-12-19 DIAGNOSIS — K922 Gastrointestinal hemorrhage, unspecified: Secondary | ICD-10-CM

## 2015-12-19 MED ORDER — NAPROXEN SODIUM 220 MG PO TABS: 220.0000 mg | ORAL_TABLET | Freq: Two times a day (BID) | ORAL | Status: DC

## 2015-12-19 NOTE — Progress Notes (Signed)
Patient ID: Evan Charles, male   DOB: May 08, 1946, 70 y.o.   MRN: [id number]   Facility: [facility]       Allergies  Allergen Reactions  . Other Rash    Tomatoes cause a rash    Chief Complaint  Patient presents with  . Hospitalization Follow-up    HPI:  He had been hospitalized due to his worsening hallucinations and inability to care for himself. He was seen in the ED; it was reccommended that he be placed in SNF for further therapy. His goals at this time is short term rehab. More than likely he will require long term placement in assisted living.    Past Medical History  Diagnosis Date  . Back pain   . Colitis   . Prostate cancer ([place])   . GERD (gastroesophageal reflux disease)   . Neuropathy ([place])     " MY HANDS "    Past Surgical History  Procedure Laterality Date  . Prostatectomy    . Laminectomy    . Knee arthroscopy      VITAL SIGNS BP 132/80 mmHg  Pulse 92  Temp(Src) 98.4 F (36.9 C)  Ht 5\' 11"  (1.803 m)  Wt 184 lb 9.6 oz (83.734 kg)  BMI 25.76 kg/m2  SpO2 91%  Patient's Medications  New Prescriptions   No medications on file  Previous Medications   ARIPIPRAZOLE (ABILIFY) 20 MG TABLET    Take 0.5 tablets (10 mg total) by mouth daily.   CHOLECALCIFEROL (VITAMIN D) 1000 UNITS TABLET    Take 1 tablet (1,000 Units total) by mouth daily.   DOCUSATE SODIUM (COLACE) 50 MG CAPSULE    Take 1 capsule (50 mg total) by mouth 3 (three) times daily as needed for mild constipation.   FUROSEMIDE (LASIX) 40 MG TABLET    Take 40 mg by mouth daily.   NAPROXEN (NAPROSYN) 500 MG TABLET    Take 500 mg by mouth 2 (two) times daily with a meal.   OLANZAPINE (ZYPREXA) 5 MG TABLET    Take 1 tablet (5 mg total) by mouth at bedtime.   PANTOPRAZOLE (PROTONIX) 40 MG TABLET    Take 1 tablet (40 mg total) by mouth 2 (two) times daily.   TRAMADOL (ULTRAM) 50 MG TABLET    Take 100 mg by mouth 3 (three) times daily.    TRAZODONE (DESYREL) 50 MG TABLET    Take 1 tablet (50 mg  total) by mouth at bedtime.   UREA (CARMOL) 20 % CREAM    Apply 1 application topically daily. Reported on 12/19/2015  Modified Medications   No medications on file  Discontinued Medications   No medications on file     SIGNIFICANT DIAGNOSTIC EXAMS  12-11-15: chest x-ray: Chronic left basilar atelectasis versus scarring and left hemidiaphragm elevation. Mild cardiomegaly without acute CHF or pneumonia.  Stable exam.  12-11-15: ct of head: No significant abnormality identified.    LABS REVIEWED:   12-11-15: wbc 5.1; hgb 11.7; hct 37.0; mcv 81.0; plt 334; glucose 98; bun 16; creat 0.80; k+ 3.6; na++141; liver normal albumin 4.1; BNP 15.4; lactic acid 1.46; RPR: nr      Review of Systems  Constitutional: Negative for malaise/fatigue.  Respiratory: Negative for cough and shortness of breath.   Cardiovascular: Positive for leg swelling. Negative for chest pain and palpitations.  Gastrointestinal: Negative for heartburn, abdominal pain and constipation.  Musculoskeletal: Negative for myalgias and joint pain.       His foot is painful  due to callus on ball of foot   Skin: Negative.   Neurological: Negative for headaches.  Psychiatric/Behavioral: Negative for depression. The patient is not nervous/anxious.      Physical Exam  Constitutional: He appears well-developed and well-nourished. No distress.  Eyes: Conjunctivae are normal.  Neck: Neck supple. No JVD present. No thyromegaly present.  Cardiovascular: Normal rate, regular rhythm and intact distal pulses.   Respiratory: Effort normal and breath sounds normal. No respiratory distress. He has no wheezes.  GI: Soft. Bowel sounds are normal. He exhibits no distension. There is no tenderness.  Musculoskeletal: He exhibits edema.  Able to move all extremities  Bilateral lower extremity edema: trace amount   Lymphadenopathy:    He has no cervical adenopathy.  Neurological: He is alert.  Skin: Skin is warm and dry. He is not  diaphoretic.  Has callus on ball of left foot; is tender to palpation  Psychiatric: He has a normal mood and affect.        ASSESSMENT/ PLAN:   1. Bilateral lower extremity edema: will continue lasix 40 mg daily and will check bmp in one week; will monitor  2. Gerd: has history of GI bleed; will continue protonix 40 mg twice daily   3. Chronic low back pain: due to his history of GI bleed; will lower his naproxen to 220 mg twice daily will continue ultram 100 mg three times daily   4. Hallucinations: was seen by psychiatry in the ED; will continue abilify 10 mg daily and will continue zyprexa 5 mg nightly is taking trazodone 50 mg nightly for sleep will monitor   Will setup a podiatry appointment for his left foot callus.   He may be moving to [facility] SNF in order to be closer to his family.     Time spent with patient  50  minutes >50% time spent counseling; reviewing medical record; tests; labs; and developing future plan of care     Ok Edwards NP [facility] Adult Medicine  Contact [phone] Monday through Friday 8am- 5pm  After hours call [phone]

## 2015-12-20 DIAGNOSIS — F489 Nonpsychotic mental disorder, unspecified: Secondary | ICD-10-CM | POA: Diagnosis not present

## 2015-12-20 DIAGNOSIS — D640 Hereditary sideroblastic anemia: Secondary | ICD-10-CM | POA: Diagnosis not present

## 2015-12-20 DIAGNOSIS — R443 Hallucinations, unspecified: Secondary | ICD-10-CM | POA: Diagnosis not present

## 2015-12-20 DIAGNOSIS — R600 Localized edema: Secondary | ICD-10-CM | POA: Diagnosis not present

## 2015-12-20 DIAGNOSIS — Z8719 Personal history of other diseases of the digestive system: Secondary | ICD-10-CM | POA: Diagnosis not present

## 2015-12-20 DIAGNOSIS — K219 Gastro-esophageal reflux disease without esophagitis: Secondary | ICD-10-CM | POA: Diagnosis not present

## 2015-12-20 DIAGNOSIS — G8929 Other chronic pain: Secondary | ICD-10-CM | POA: Diagnosis not present

## 2015-12-20 DIAGNOSIS — I10 Essential (primary) hypertension: Secondary | ICD-10-CM | POA: Diagnosis not present

## 2015-12-20 DIAGNOSIS — M545 Low back pain: Secondary | ICD-10-CM | POA: Diagnosis not present

## 2015-12-20 DIAGNOSIS — M549 Dorsalgia, unspecified: Secondary | ICD-10-CM | POA: Diagnosis not present

## 2015-12-20 DIAGNOSIS — F29 Unspecified psychosis not due to a substance or known physiological condition: Secondary | ICD-10-CM | POA: Diagnosis not present

## 2015-12-21 ENCOUNTER — Non-Acute Institutional Stay (SKILLED_NURSING_FACILITY): Payer: Medicare Other | Admitting: Adult Health

## 2015-12-21 ENCOUNTER — Encounter: Payer: Self-pay | Admitting: Adult Health

## 2015-12-21 DIAGNOSIS — F29 Unspecified psychosis not due to a substance or known physiological condition: Secondary | ICD-10-CM | POA: Diagnosis not present

## 2015-12-21 DIAGNOSIS — R443 Hallucinations, unspecified: Secondary | ICD-10-CM

## 2015-12-21 DIAGNOSIS — M549 Dorsalgia, unspecified: Secondary | ICD-10-CM | POA: Diagnosis not present

## 2015-12-21 DIAGNOSIS — G8929 Other chronic pain: Secondary | ICD-10-CM

## 2015-12-21 DIAGNOSIS — R600 Localized edema: Secondary | ICD-10-CM

## 2015-12-21 DIAGNOSIS — K219 Gastro-esophageal reflux disease without esophagitis: Secondary | ICD-10-CM | POA: Diagnosis not present

## 2015-12-21 NOTE — Progress Notes (Signed)
Patient ID: Evan Charles, male   DOB: [birth year], 70 y.o.   MRN: [id number]    Facility:  [facility]       Allergies  Allergen Reactions  . Other Rash    Tomatoes cause a rash    Chief Complaint  Patient presents with  . Acute Visit    follow up transfer from another snf    HPI:  He is being transferred to this facility from another snf; in order to be closer to his family. He is a poor historian and cannot fully participate but does tell me that he is doing well and feels good. There are no nursing concerns at this time. He had been in the ED due to worsening hallucinations and poor ability to care for himself.    Past Medical History  Diagnosis Date  . Back pain   . Colitis   . Prostate cancer ([place])   . GERD (gastroesophageal reflux disease)   . Neuropathy ([place])     " MY HANDS "    Past Surgical History  Procedure Laterality Date  . Prostatectomy    . Laminectomy    . Knee arthroscopy      VITAL SIGNS BP 132/80 mmHg  Pulse 78  Ht 5\' 11"  (1.803 m)  Wt 184 lb (83.462 kg)  BMI 25.67 kg/m2  SpO2 93%  Patient's Medications  New Prescriptions   No medications on file  Previous Medications   ARIPIPRAZOLE (ABILIFY) 20 MG TABLET    Take 0.5 tablets (10 mg total) by mouth daily.   CHOLECALCIFEROL (VITAMIN D) 1000 UNITS TABLET    Take 1 tablet (1,000 Units total) by mouth daily.   DOCUSATE SODIUM (COLACE) 50 MG CAPSULE    Take 1 capsule (50 mg total) by mouth 3 (three) times daily as needed for mild constipation.   FUROSEMIDE (LASIX) 40 MG TABLET    Take 40 mg by mouth daily.   NAPROXEN SODIUM (ALEVE) 220 MG TABLET    Take 1 tablet (220 mg total) by mouth 2 (two) times daily with a meal.   OLANZAPINE (ZYPREXA) 5 MG TABLET    Take 1 tablet (5 mg total) by mouth at bedtime.   PANTOPRAZOLE (PROTONIX) 40 MG TABLET    Take 1 tablet (40 mg total) by mouth 2 (two) times daily.   TRAMADOL (ULTRAM) 50 MG TABLET    Take 100 mg by mouth 3 (three) times daily.    TRAZODONE  (DESYREL) 50 MG TABLET    Take 1 tablet (50 mg total) by mouth at bedtime.   UREA (CARMOL) 20 % CREAM    Apply 1 application topically daily. Reported on 12/19/2015  Modified Medications   No medications on file  Discontinued Medications   No medications on file     SIGNIFICANT DIAGNOSTIC EXAMS   12-11-15: chest x-ray: Chronic left basilar atelectasis versus scarring and left hemidiaphragm elevation. Mild cardiomegaly without acute CHF or pneumonia.  Stable exam.  12-11-15: ct of head: No significant abnormality identified.    LABS REVIEWED:   12-11-15: wbc 5.1; hgb 11.7; hct 37.0; mcv 81.0; plt 334; glucose 98; bun 16; creat 0.80; k+ 3.6; na++141; liver normal albumin 4.1; BNP 15.4; lactic acid 1.46; RPR: nr      Review of Systems  Constitutional: Negative for malaise/fatigue.  Respiratory: Negative for cough and shortness of breath.   Cardiovascular: Positive for leg swelling. Negative for chest pain and palpitations.  Gastrointestinal: Negative for heartburn, abdominal pain and constipation.  Musculoskeletal: Negative for myalgias and joint pain.       His foot is painful due to callus on ball of foot   Skin: Negative.   Neurological: Negative for headaches.  Psychiatric/Behavioral: Negative for depression. The patient is not nervous/anxious.      Physical Exam  Constitutional: He appears well-developed and well-nourished. No distress.  Eyes: Conjunctivae are normal.  Neck: Neck supple. No JVD present. No thyromegaly present.  Cardiovascular: Normal rate, regular rhythm and intact distal pulses.   Respiratory: Effort normal and breath sounds normal. No respiratory distress. He has no wheezes.  GI: Soft. Bowel sounds are normal. He exhibits no distension. There is no tenderness.  Musculoskeletal: He exhibits edema.  Able to move all extremities  Bilateral lower extremity edema: trace amount   Lymphadenopathy:    He has no cervical adenopathy.  Neurological: He is alert.   Skin: Skin is warm and dry. He is not diaphoretic.  Has callus on ball of left foot; is tender to palpation  Psychiatric: He has a normal mood and affect.        ASSESSMENT/ PLAN:   1. Bilateral lower extremity edema: Charles continue lasix 40 mg daily and Charles check bmp in one week; Charles monitor  2. Gerd: has history of GI bleed; Charles continue protonix 40 mg twice daily   3. Chronic low back pain: due to his history of GI bleed; Charles continue naproxen 220 mg twice daily Charles continue ultram 100 mg three times daily   4. Hallucinations: was seen by psychiatry in the ED; Charles continue abilify 10 mg daily and Charles continue zyprexa 5 mg nightly is taking trazodone 50 mg nightly for sleep Charles monitor   Charles setup a podiatry appointment for his left foot callus.     Time spent with patient 50 minutes >50% time spent counseling; reviewing medical record; tests; labs; and developing future plan of care    Ok Edwards NP [facility] Adult Medicine  Contact [phone] Monday through Friday 8am- 5pm  After hours call [phone]

## 2015-12-22 ENCOUNTER — Encounter: Payer: Self-pay | Admitting: Internal Medicine

## 2015-12-22 ENCOUNTER — Non-Acute Institutional Stay (SKILLED_NURSING_FACILITY): Payer: Medicare Other | Admitting: Internal Medicine

## 2015-12-22 DIAGNOSIS — K219 Gastro-esophageal reflux disease without esophagitis: Secondary | ICD-10-CM

## 2015-12-22 DIAGNOSIS — M549 Dorsalgia, unspecified: Secondary | ICD-10-CM | POA: Diagnosis not present

## 2015-12-22 DIAGNOSIS — G8929 Other chronic pain: Secondary | ICD-10-CM | POA: Diagnosis not present

## 2015-12-22 DIAGNOSIS — F489 Nonpsychotic mental disorder, unspecified: Secondary | ICD-10-CM | POA: Diagnosis not present

## 2015-12-22 DIAGNOSIS — F29 Unspecified psychosis not due to a substance or known physiological condition: Secondary | ICD-10-CM

## 2015-12-22 DIAGNOSIS — R600 Localized edema: Secondary | ICD-10-CM

## 2015-12-22 DIAGNOSIS — F5105 Insomnia due to other mental disorder: Secondary | ICD-10-CM

## 2015-12-22 DIAGNOSIS — Z8719 Personal history of other diseases of the digestive system: Secondary | ICD-10-CM

## 2015-12-22 NOTE — Progress Notes (Signed)
MRN: [id number] Name: Evan Charles  Sex: male Age: 70 y.o. DOB: 03/22/1946  [place] #: [facility] Facility/Room:118 Level Of Care: SNF Provider: Inocencio Homes D Emergency Contacts: Extended Emergency Contact Information Primary Emergency Contact: Anderson,Edith Address: [address]          [place] [address] [place]net[place]itter of [place] Phone: [phone] Relation: Spouse Secondary Emergency Contact: Dimas Aguas [place] of [place] Phone: [phone] Relation: Daughter  Code Status:   Allergies: Other  Chief Complaint  Patient presents with  . New Admit To SNF    HPI: Patient is 70 y.o. male with back pain, neuropathy, h/o prostate CA prior hospitalization due to his worsening hallucinations and inability to care for himself who is being transferred from another SNF. Last hospitalization was 02/2015 for  upper GI bleed. While at SNF pt will be followed for multiple problems including psychosis, tx with abilify and zyprexa, GERD, tx with protonix and BLE edema, tx with lasix.  Past Medical History  Diagnosis Date  . Back pain   . Colitis   . Prostate cancer ([place])   . GERD (gastroesophageal reflux disease)   . Neuropathy ([place])     " MY HANDS "    Past Surgical History  Procedure Laterality Date  . Prostatectomy    . Laminectomy    . Knee arthroscopy        Medication List       This list is accurate as of: 12/22/15 11:59 PM.  Always use your most recent med list.               ARIPiprazole 20 MG tablet  Commonly known as:  ABILIFY  Take 0.5 tablets (10 mg total) by mouth daily.     cholecalciferol 1000 units tablet  Commonly known as:  VITAMIN D  Take 1 tablet (1,000 Units total) by mouth daily.     docusate sodium 50 MG capsule  Commonly known as:  COLACE  Take 1 capsule (50 mg total) by mouth 3 (three) times daily as needed for mild constipation.     furosemide 40 MG tablet  Commonly known as:  LASIX  Take 40 mg by mouth  daily.     naproxen sodium 220 MG tablet  Commonly known as:  ALEVE  Take 1 tablet (220 mg total) by mouth 2 (two) times daily with a meal.     OLANZapine 5 MG tablet  Commonly known as:  ZYPREXA  Take 1 tablet (5 mg total) by mouth at bedtime.     pantoprazole 40 MG tablet  Commonly known as:  PROTONIX  Take 1 tablet (40 mg total) by mouth 2 (two) times daily.     traMADol 50 MG tablet  Commonly known as:  ULTRAM  Take 100 mg by mouth 3 (three) times daily.     traZODone 50 MG tablet  Commonly known as:  DESYREL  Take 1 tablet (50 mg total) by mouth at bedtime.     urea 20 % cream  Commonly known as:  CARMOL  Apply 1 application topically daily. Reported on 12/19/2015        No orders of the defined types were placed in this encounter.    Immunization History  Administered Date(s) Administered  . DTaP 06/09/2001  . Tdap 08/14/2013    Social History  Substance Use Topics  . Smoking status: Former Smoker    Types: Cigarettes  . Smokeless tobacco: Never Used  . Alcohol Use: No  Family history is +CA   Review of Systems  DATA OBTAINED: from patient, nurse, medical record GENERAL:  no fevers, fatigue, appetite changes SKIN: No itching, rash or wounds EYES: No eye pain, redness, discharge EARS: No earache, tinnitus, change in hearing NOSE: No congestion, drainage or bleeding  MOUTH/THROAT: No mouth or tooth pain, No sore throat RESPIRATORY: No cough, wheezing, SOB CARDIAC: No chest pain, palpitations, lower extremity edema  GI: No abdominal pain, No N/V/D or constipation, No heartburn or reflux  GU: No dysuria, frequency or urgency, or incontinence  MUSCULOSKELETAL: No unrelieved bone/joint pain NEUROLOGIC: No headache, dizziness or focal weakness PSYCHIATRIC: No c/o anxiety or sadness   Filed Vitals:   12/25/15 1155  BP: 114/70  Pulse: 90  Temp: 96.9 F (36.1 C)  Resp: 18    SpO2 Readings from Last 1 Encounters:  12/25/15 98%         Physical Exam  GENERAL APPEARANCE: Alert, conversant,  No acute distress.  SKIN: No diaphoresis rash HEAD: Normocephalic, atraumatic  EYES: Conjunctiva/lids clear. Pupils round, reactive. EOMs intact.  EARS: External exam WNL, canals clear. Hearing grossly normal.  NOSE: No deformity or discharge.  MOUTH/THROAT: Lips w/o lesions  RESPIRATORY: Breathing is even, unlabored. Lung sounds are clear   CARDIOVASCULAR: Heart RRR no murmurs, rubs or gallops. No peripheral edema.   GASTROINTESTINAL: Abdomen is soft, non-tender, not distended w/ normal bowel sounds. GENITOURINARY: Bladder non tender, not distended  MUSCULOSKELETAL: No abnormal joints or musculature NEUROLOGIC:  Cranial nerves 2-12 grossly intact. Moves all extremities  PSYCHIATRIC: Mood and affect appropriate to situation, no behavioral issues  Patient Active Problem List   Diagnosis Date Noted  . GERD without esophagitis 12/19/2015  . Bilateral lower extremity edema 12/19/2015  . Intractable low back pain 02/14/2015  . Difficulty walking 02/14/2015  . Prostate cancer ([place]) 02/14/2015  . Upper GI bleed   . Chronic back pain   . GI bleed 02/13/2015  . Visual hallucinations   . Hallucinations 11/02/2014  . Psychoses     CBC    Component Value Date/Time   WBC 5.1 12/11/2015 1303   RBC 4.57 12/11/2015 1303   HGB 11.7* 12/11/2015 1303   HCT 37.0* 12/11/2015 1303   PLT 334 12/11/2015 1303   MCV 81.0 12/11/2015 1303   LYMPHSABS 1.2 02/13/2015 1725   MONOABS 0.4 02/13/2015 1725   EOSABS 0.2 02/13/2015 1725   BASOSABS 0.0 02/13/2015 1725    CMP     Component Value Date/Time   NA 141 12/11/2015 1303   K 3.6 12/11/2015 1303   CL 104 12/11/2015 1303   CO2 25 12/11/2015 1303   GLUCOSE 98 12/11/2015 1303   BUN 16 12/11/2015 1303   CREATININE 0.80 12/11/2015 1303   CALCIUM 9.3 12/11/2015 1303   PROT 7.0 12/11/2015 1303   ALBUMIN 4.1 12/11/2015 1303   AST 35 12/11/2015 1303   ALT 17 12/11/2015 1303    ALKPHOS 55 12/11/2015 1303   BILITOT 0.7 12/11/2015 1303   GFRNONAA >60 12/11/2015 1303   GFRAA >60 12/11/2015 1303    No results found for: HGBA1C   Dg Chest 2 View  12/11/2015  CLINICAL DATA:  Altered mental status EXAM: CHEST  2 VIEW COMPARISON:  02/13/2015 FINDINGS: Minor left basilar atelectasis scarring with left hemidiaphragm elevation as before. Mild cardiomegaly without CHF or focal pneumonia. No significant collapse or consolidation. No large effusion or pneumothorax. Trachea midline. Tortuous aorta evident. Degenerative changes of the spine. IMPRESSION: Chronic left basilar atelectasis versus  scarring and left hemidiaphragm elevation. Mild cardiomegaly without acute CHF or pneumonia.  Stable exam. Electronically Signed   By: Jerilynn Mages.  Shick M.D.   On: 12/11/2015 14:16   Ct Head Wo Contrast  12/11/2015  CLINICAL DATA:  Fall. Confusion. Posterior head pain. Hallucinations. EXAM: CT HEAD WITHOUT CONTRAST TECHNIQUE: Contiguous axial images were obtained from the base of the skull through the vertex without intravenous contrast. COMPARISON:  12/13/2014 Findings The brainstem, cerebellum, cerebral peduncles, thalami, basal ganglia, basilar cisterns, and ventricular system appear within normal limits. No intracranial hemorrhage, mass lesion, or acute CVA. IMPRESSION: No significant abnormality identified. Electronically Signed   By: Van Clines M.D.   On: 12/11/2015 15:20    Not all labs, radiology exams or other studies done during hospitalization come through on my EPIC note; however they are reviewed by me.    Assessment and Plan  No problem-specific assessment & plan notes found for this encounter.   Hennie Duos, MD    This encounter was created in error - please disregard.

## 2015-12-25 ENCOUNTER — Encounter: Payer: Self-pay | Admitting: Internal Medicine

## 2015-12-25 DIAGNOSIS — F5105 Insomnia due to other mental disorder: Secondary | ICD-10-CM | POA: Insufficient documentation

## 2015-12-25 NOTE — Progress Notes (Signed)
MRN: [id number] Name: Evan Charles  Sex: male Age: 70 y.o. DOB: [birth year]  [place] #: [facility] Facility/Room:118 Level Of Care: SNF Provider: Inocencio Homes D Emergency Contacts: Extended Emergency Contact Information Primary Emergency Contact: Anderson,Edith Address: [address]          [place] [address] Johnnette Litter of [place] Phone: [phone] Relation: Spouse Secondary Emergency Contact: Dimas Aguas [place] of [place] Phone: [phone] Relation: Daughter  Code Status:   Allergies: Other  Chief Complaint  Patient presents with  . New Admit To SNF    HPI: Patient is 70 y.o. male  With GERD, prostate CA, CBP who ios being transferred from another SNF. He was admitted to SNF for worsening hallucinations. Last hospitalizatio was in 02/2015 for upper GI bleed. While at SNF pt will be followed for numerous problems among them GERD, tx with protonix, BLE edema, tx with lasix and psychosis ,tx with abilify and zyprexa.  Past Medical History  Diagnosis Date  . Back pain   . Colitis   . Prostate cancer ([facility])   . GERD (gastroesophageal reflux disease)   . Neuropathy ([facility])     " MY HANDS "    Past Surgical History  Procedure Laterality Date  . Prostatectomy    . Laminectomy    . Knee arthroscopy        Medication List       This list is accurate as of: 12/22/15 11:59 PM.  Always use your most recent med list.               ARIPiprazole 20 MG tablet  Commonly known as:  ABILIFY  Take 0.5 tablets (10 mg total) by mouth daily.     cholecalciferol 1000 units tablet  Commonly known as:  VITAMIN D  Take 1 tablet (1,000 Units total) by mouth daily.     docusate sodium 50 MG capsule  Commonly known as:  COLACE  Take 1 capsule (50 mg total) by mouth 3 (three) times daily as needed for mild constipation.     furosemide 40 MG tablet  Commonly known as:  LASIX  Take 40 mg by mouth daily.     naproxen sodium 220 MG tablet  Commonly  known as:  ALEVE  Take 1 tablet (220 mg total) by mouth 2 (two) times daily with a meal.     OLANZapine 5 MG tablet  Commonly known as:  ZYPREXA  Take 1 tablet (5 mg total) by mouth at bedtime.     pantoprazole 40 MG tablet  Commonly known as:  PROTONIX  Take 1 tablet (40 mg total) by mouth 2 (two) times daily.     traMADol 50 MG tablet  Commonly known as:  ULTRAM  Take 100 mg by mouth 3 (three) times daily.     traZODone 50 MG tablet  Commonly known as:  DESYREL  Take 1 tablet (50 mg total) by mouth at bedtime.     urea 20 % cream  Commonly known as:  CARMOL  Apply 1 application topically daily. Reported on 12/19/2015        No orders of the defined types were placed in this encounter.    Immunization History  Administered Date(s) Administered  . DTaP 06/09/2001  . Tdap 08/14/2013    Social History  Substance Use Topics  . Smoking status: Former Smoker    Types: Cigarettes  . Smokeless tobacco: Never Used  . Alcohol Use: No    Family history is +  CA   Review of Systems UTO from pt 2/2 mental status     Filed Vitals:   12/25/15 1220  BP: 135/70  Pulse: 68  Temp: 96.5 F (35.8 C)  Resp: 20    SpO2 Readings from Last 1 Encounters:  12/25/15 98%        Physical Exam  GENERAL APPEARANCE: Alert, mod conversant,  No acute distress.  SKIN: No diaphoresis rash HEAD: Normocephalic, atraumatic  EYES: Conjunctiva/lids clear. Pupils round, reactive. EOMs intact.  EARS: External exam WNL, canals clear. Hearing grossly normal.  NOSE: No deformity or discharge.  MOUTH/THROAT: Lips w/o lesions  RESPIRATORY: Breathing is even, unlabored. Lung sounds are clear   CARDIOVASCULAR: Heart RRR no murmurs, rubs or gallops. No peripheral edema.   GASTROINTESTINAL: Abdomen is soft, non-tender, not distended w/ normal bowel sounds. GENITOURINARY: Bladder non tender, not distended  MUSCULOSKELETAL: No abnormal joints or musculature NEUROLOGIC:  Cranial nerves 2-12  grossly intact. Moves all extremities  PSYCHIATRIC: odd affect, no behavioral issues  Patient Active Problem List   Diagnosis Date Noted  . 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 GERD without esophagitis 12/19/2015  . Bilateral lower extremity edema 12/19/2015  . Intractable low back pain 02/14/2015  . Difficulty walking 02/14/2015  . Prostate cancer ([place]) 02/14/2015  . H/O: upper GI bleed   . Chronic back pain   . GI bleed 02/13/2015  . Visual hallucinations   . Hallucinations 11/02/2014  . Psychoses     CBC    Component Value Date/Time   WBC 5.1 12/11/2015 1303   RBC 4.57 12/11/2015 1303   HGB 11.7* 12/11/2015 1303   HCT 37.0* 12/11/2015 1303   PLT 334 12/11/2015 1303   MCV 81.0 12/11/2015 1303   LYMPHSABS 1.2 02/13/2015 1725   MONOABS 0.4 02/13/2015 1725   EOSABS 0.2 02/13/2015 1725   BASOSABS 0.0 02/13/2015 1725    CMP     Component Value Date/Time   NA 141 12/11/2015 1303   K 3.6 12/11/2015 1303   CL 104 12/11/2015 1303   CO2 25 12/11/2015 1303   GLUCOSE 98 12/11/2015 1303   BUN 16 12/11/2015 1303   CREATININE 0.80 12/11/2015 1303   CALCIUM 9.3 12/11/2015 1303   PROT 7.0 12/11/2015 1303   ALBUMIN 4.1 12/11/2015 1303   AST 35 12/11/2015 1303   ALT 17 12/11/2015 1303   ALKPHOS 55 12/11/2015 1303   BILITOT 0.7 12/11/2015 1303   GFRNONAA >60 12/11/2015 1303   GFRAA >60 12/11/2015 1303    No results found for: HGBA1C   Dg Chest 2 View  12/11/2015  CLINICAL DATA:  Altered mental status EXAM: CHEST  2 VIEW COMPARISON:  02/13/2015 FINDINGS: Minor left basilar atelectasis scarring with left hemidiaphragm elevation as before. Mild cardiomegaly without CHF or focal pneumonia. No significant collapse or consolidation. No large effusion or pneumothorax. Trachea midline. Tortuous aorta evident. Degenerative changes of the spine. IMPRESSION: Chronic left basilar atelectasis versus scarring and left hemidiaphragm elevation. Mild cardiomegaly without  acute CHF or pneumonia.  Stable exam. Electronically Signed   By: Jerilynn Mages.  Shick M.D.   On: 12/11/2015 14:16   Ct Head Wo Contrast  12/11/2015  CLINICAL DATA:  Fall. Confusion. Posterior head pain. Hallucinations. EXAM: CT HEAD WITHOUT CONTRAST TECHNIQUE: Contiguous axial images were obtained from the base of the skull through the vertex without intravenous contrast. COMPARISON:  12/13/2014 Findings The brainstem, cerebellum, cerebral peduncles, thalami, basal ganglia, basilar cisterns, and ventricular system appear within normal limits. No  intracranial hemorrhage, mass lesion, or acute CVA. IMPRESSION: No significant abnormality identified. Electronically Signed   By: Van Clines M.D.   On: 12/11/2015 15:20    Not all labs, radiology exams or other studies done during hospitalization come through on my EPIC note; however they are reviewed by me.    Assessment and Plan  Psychoses Visual hallucinations; continue abilify 10 mg daily and will continue zyprexa 5 mg nightly is taking trazodone 50 mg nightly for sleep will monitor   Insomnia related to another mental disorder Not specifiedif controlled well;cont trazodone nightly 50 mg  GERD without esophagitis H/o Gi Bleed;cont protonix 40 mg BID   Chronic back pain Reported chronic intractable; pt came in on naprosyn, with hx GI leed should be d/c;cont ultram 100mg  TID   '  Bilateral lower extremity edema Continue lasix 40 mg daily  H/O: upper GI bleed Continue protonix 40 mg BID   Time spent 50 min;> 50% of time with patient was spent reviewing records, labs, tests and studies, counseling and developing plan of care  Hennie Duos, MD

## 2015-12-25 NOTE — Assessment & Plan Note (Signed)
Visual hallucinations; continue abilify 10 mg daily and will continue zyprexa 5 mg nightly is taking trazodone 50 mg nightly for sleep will monitor

## 2015-12-25 NOTE — Assessment & Plan Note (Signed)
Continue lasix 40 mg daily.  

## 2015-12-25 NOTE — Assessment & Plan Note (Signed)
Not specifiedif controlled well;cont trazodone nightly 50 mg

## 2015-12-25 NOTE — Assessment & Plan Note (Signed)
Reported chronic intractable; pt came in on naprosyn, with hx GI leed should be d/c;cont ultram 100mg  TID   '

## 2015-12-25 NOTE — Assessment & Plan Note (Signed)
Continue protonix 40 mg BID

## 2015-12-25 NOTE — Assessment & Plan Note (Signed)
H/o Gi Bleed;cont protonix 40 mg BID

## 2015-12-28 LAB — BASIC METABOLIC PANEL
BUN: 20 mg/dL (ref 4–21)
CREATININE: 0.9 mg/dL (ref 0.6–1.3)
GLUCOSE: 84 mg/dL
Potassium: 4.6 mmol/L (ref 3.4–5.3)
Sodium: 144 mmol/L (ref 137–147)

## 2015-12-28 LAB — CBC AND DIFFERENTIAL
HEMATOCRIT: 37 % — AB (ref 41–53)
Hemoglobin: 11 g/dL — AB (ref 13.5–17.5)
PLATELETS: 279 10*3/uL (ref 150–399)
WBC: 5.2 10*3/mL

## 2016-01-11 ENCOUNTER — Encounter: Payer: Self-pay | Admitting: Adult Health

## 2016-01-11 ENCOUNTER — Non-Acute Institutional Stay (SKILLED_NURSING_FACILITY): Payer: Medicare Other | Admitting: Adult Health

## 2016-01-11 DIAGNOSIS — R600 Localized edema: Secondary | ICD-10-CM | POA: Diagnosis not present

## 2016-01-11 DIAGNOSIS — M549 Dorsalgia, unspecified: Secondary | ICD-10-CM

## 2016-01-11 DIAGNOSIS — K219 Gastro-esophageal reflux disease without esophagitis: Secondary | ICD-10-CM

## 2016-01-11 DIAGNOSIS — G8929 Other chronic pain: Secondary | ICD-10-CM | POA: Diagnosis not present

## 2016-01-11 DIAGNOSIS — R443 Hallucinations, unspecified: Secondary | ICD-10-CM | POA: Diagnosis not present

## 2016-01-11 NOTE — Progress Notes (Signed)
Patient ID: Evan Charles, male   DOB: 10/26/1946, 70 y.o.   MRN: [id number]   Facility:  [facility]       Allergies  Allergen Reactions  . Other Rash    Tomatoes cause a rash    Chief Complaint  Patient presents with  . Medical Management of Chronic Issues    Follow up    HPI:  He is a resident of this facility being seen for the management of his chronic illnesses. Overall his status is stable. He tells me that he is feeling good and has no complaints or concerns at this time. There are no nursing concerns at this time.    Past Medical History  Diagnosis Date  . Back pain   . Colitis   . Prostate cancer ([place])   . GERD (gastroesophageal reflux disease)   . Neuropathy ([place])     " MY HANDS "    Past Surgical History  Procedure Laterality Date  . Prostatectomy    . Laminectomy    . Knee arthroscopy      VITAL SIGNS BP 115/80 mmHg  Pulse 92  Temp(Src) 97.8 F (36.6 C) (Oral)  Resp 20  Ht 5\' 10"  (1.778 m)  Wt 186 lb (84.369 kg)  BMI 26.69 kg/m2  Patient's Medications  New Prescriptions   No medications on file  Previous Medications   ARIPIPRAZOLE (ABILIFY) 20 MG TABLET    Take 0.5 tablets (10 mg total) by mouth daily.   CHOLECALCIFEROL (VITAMIN D) 1000 UNITS TABLET    Take 1 tablet (1,000 Units total) by mouth daily.   DOCUSATE SODIUM (COLACE) 50 MG CAPSULE    Take 1 capsule (50 mg total) by mouth 3 (three) times daily as needed for mild constipation.   FUROSEMIDE (LASIX) 40 MG TABLET    Take 40 mg by mouth daily.   OLANZAPINE (ZYPREXA) 5 MG TABLET    Take 1 tablet (5 mg total) by mouth at bedtime.   PANTOPRAZOLE (PROTONIX) 40 MG TABLET    Take 1 tablet (40 mg total) by mouth 2 (two) times daily.   TRAMADOL (ULTRAM) 50 MG TABLET    Take 100 mg by mouth every 8 (eight) hours as needed (pain).    TRAZODONE (DESYREL) 50 MG TABLET    Take 1 tablet (50 mg total) by mouth at bedtime.  Modified Medications   No medications on file  Discontinued Medications      SIGNIFICANT DIAGNOSTIC EXAMS  12-11-15: chest x-ray: Chronic left basilar atelectasis versus scarring and left hemidiaphragm elevation. Mild cardiomegaly without acute CHF or pneumonia.  Stable exam.  12-11-15: ct of head: No significant abnormality identified.    LABS REVIEWED:   12-11-15: wbc 5.1; hgb 11.7; hct 37.0; mcv 81.0; plt 334; glucose 98; bun 16; creat 0.80; k+ 3.6; na++141; liver normal albumin 4.1; BNP 15.4; lactic acid 1.46; RPR: nr  12-28-15: wbc 5.2; hgb 11.0; hct 36.9; mcv 82.4; plt 279; glucose 84; bun 20.4; creat 0.92'; k+ 4.6; na++144      Review of Systems  Constitutional: Negative for malaise/fatigue.  Respiratory: Negative for cough and shortness of breath.   Cardiovascular: Positive for leg swelling. Negative for chest pain and palpitations.  Gastrointestinal: Negative for heartburn, abdominal pain and constipation.  Musculoskeletal: Negative for myalgias and joint pain.    Skin: Negative.   Neurological: Negative for headaches.  Psychiatric/Behavioral: Negative for depression. The patient is not nervous/anxious.      Physical Exam  Constitutional: He appears well-developed  and well-nourished. No distress.  Eyes: Conjunctivae are normal.  Neck: Neck supple. No JVD present. No thyromegaly present.  Cardiovascular: Normal rate, regular rhythm and intact distal pulses.   Respiratory: Effort normal and breath sounds normal. No respiratory distress. He has no wheezes.  GI: Soft. Bowel sounds are normal. He exhibits no distension. There is no tenderness.  Musculoskeletal: He exhibits edema.  Able to move all extremities  Bilateral lower extremity edema: trace amount   Lymphadenopathy:    He has no cervical adenopathy.  Neurological: He is alert.  Skin: Skin is warm and dry. He is not diaphoretic.  Has callus on ball of left foot; is tender to palpation  Psychiatric: He has a normal mood and affect.        ASSESSMENT/ PLAN:   1. Bilateral  lower extremity edema: will continue lasix 40 mg daily and will check bmp in one week; will monitor  2. Gerd: has history of GI bleed; will continue protonix 40 mg twice daily   3. Chronic low back pain: due to his history of GI bleed; will continue ultram 100 mg three times daily his aleve has been stopped.   4. Hallucinations: was seen by psychiatry in the ED; will continue abilify 10 mg daily and will continue zyprexa 5 mg nightly is taking trazodone 50 mg nightly for sleep will monitor       Ok Edwards NP [facility]  Contact [phone] Monday through Friday 8am- 5pm  After hours call [phone]

## 2016-01-23 ENCOUNTER — Encounter: Payer: Self-pay | Admitting: Internal Medicine

## 2016-01-23 ENCOUNTER — Non-Acute Institutional Stay (SKILLED_NURSING_FACILITY): Payer: Medicare Other | Admitting: Internal Medicine

## 2016-01-23 DIAGNOSIS — K219 Gastro-esophageal reflux disease without esophagitis: Secondary | ICD-10-CM | POA: Diagnosis not present

## 2016-01-23 DIAGNOSIS — Z8719 Personal history of other diseases of the digestive system: Secondary | ICD-10-CM

## 2016-01-23 DIAGNOSIS — R600 Localized edema: Secondary | ICD-10-CM | POA: Diagnosis not present

## 2016-01-23 DIAGNOSIS — F29 Unspecified psychosis not due to a substance or known physiological condition: Secondary | ICD-10-CM

## 2016-01-23 DIAGNOSIS — M549 Dorsalgia, unspecified: Secondary | ICD-10-CM

## 2016-01-23 DIAGNOSIS — F5105 Insomnia due to other mental disorder: Secondary | ICD-10-CM

## 2016-01-23 DIAGNOSIS — G8929 Other chronic pain: Secondary | ICD-10-CM | POA: Diagnosis not present

## 2016-01-23 DIAGNOSIS — F489 Nonpsychotic mental disorder, unspecified: Secondary | ICD-10-CM | POA: Diagnosis not present

## 2016-01-23 NOTE — Progress Notes (Signed)
Patient ID: Evan Charles, male   DOB: [birth year], 70 y.o.   MRN: [id number] [id number]: [id number] Name: Evan Charles  Sex: male Age: 70 y.o. DOB: 05/15/1946  Ruth #: Karren Burly Facility/Room:118 Level Of Care: SNF Provider: Inocencio Homes Emergency Contacts: Extended Emergency Contact Information Primary Emergency Contact: Anderson,Edith Address: [address]          [place] [address] [place] of [place] Phone: [phone] Relation: Spouse Secondary Emergency Contact: Dimas Aguas [place] of [place] Phone: [phone] Relation: Daughter  Code Status:   Allergies: Other  Chief Complaint  Patient presents with  . Discharge Note    discharge from facility.    HPI: Patient is 70 y.o. male whoWith GERD, prostate CA, CBP who was transferred from another SNF. He was admitted to SNF for worsening hallucinations. Pt wants to go home. Family is aware, PT/OT do not feel like he has cognition to be safe and I agree. Pt is deemed competent. Pt is going leave whether we help him or not so we are helping him because it is safer although we don't agree.  Past Medical History  Diagnosis Date  . Back pain   . Colitis   . Prostate cancer ([place])   . GERD (gastroesophageal reflux disease)   . Neuropathy ([place])     " MY HANDS "    Past Surgical History  Procedure Laterality Date  . Prostatectomy    . Laminectomy    . Knee arthroscopy        Medication List       This list is accurate as of: 01/23/16  2:27 PM.  Always use your most recent med list.               ARIPiprazole 20 MG tablet  Commonly known as:  ABILIFY  Take 0.5 tablets (10 mg total) by mouth daily.     cholecalciferol 1000 units tablet  Commonly known as:  VITAMIN D  Take 1 tablet (1,000 Units total) by mouth daily.     docusate sodium 50 MG capsule  Commonly known as:  COLACE  Take 1 capsule (50 mg total) by mouth 3 (three) times daily as needed for mild constipation.      furosemide 40 MG tablet  Commonly known as:  LASIX  Take 40 mg by mouth daily.     OLANZapine 5 MG tablet  Commonly known as:  ZYPREXA  Take 1 tablet (5 mg total) by mouth at bedtime.     pantoprazole 40 MG tablet  Commonly known as:  PROTONIX  Take 1 tablet (40 mg total) by mouth 2 (two) times daily.     traMADol 50 MG tablet  Commonly known as:  ULTRAM  Take 100 mg by mouth every 8 (eight) hours as needed (pain).     traZODone 50 MG tablet  Commonly known as:  DESYREL  Take 1 tablet (50 mg total) by mouth at bedtime.        No orders of the defined types were placed in this encounter.    Immunization History  Administered Date(s) Administered  . DTaP 06/09/2001  . Tdap 08/14/2013    Social History  Substance Use Topics  . Smoking status: Former Smoker    Types: Cigarettes  . Smokeless tobacco: Never Used  . Alcohol Use: No    Filed Vitals:   01/23/16 1240  BP: 115/80  Pulse: 92  Temp: 97.8 F (36.6 C)  Resp: 20  Physical Exam  GENERAL APPEARANCE: Alert, conversant. No acute distress.  HEENT: Unremarkable. RESPIRATORY: Breathing is even, unlabored. Lung sounds are clear   CARDIOVASCULAR: Heart RRR no murmurs, rubs or gallops. No peripheral edema.  GASTROINTESTINAL: Abdomen is soft, non-tender, not distended w/ normal bowel sounds.  NEUROLOGIC: Cranial nerves 2-12 grossly intact. Moves all extremities  Patient Active Problem List   Diagnosis Date Noted  . 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 GERD without esophagitis 12/19/2015  . Bilateral lower extremity edema 12/19/2015  . Intractable low back pain 02/14/2015  . Difficulty walking 02/14/2015  . Prostate cancer ([place]) 02/14/2015  . H/O: upper GI bleed   . Chronic back pain   . GI bleed 02/13/2015  . Visual hallucinations   . Hallucinations 11/02/2014  . Psychoses     CBC    Component Value Date/Time   WBC 5.2 12/28/2015   WBC 5.1 12/11/2015 1303   RBC 4.57  12/11/2015 1303   HGB 11.0* 12/28/2015   HCT 37* 12/28/2015   PLT 279 12/28/2015   MCV 81.0 12/11/2015 1303   LYMPHSABS 1.2 02/13/2015 1725   MONOABS 0.4 02/13/2015 1725   EOSABS 0.2 02/13/2015 1725   BASOSABS 0.0 02/13/2015 1725    CMP     Component Value Date/Time   NA 144 12/28/2015   NA 141 12/11/2015 1303   K 4.6 12/28/2015   CL 104 12/11/2015 1303   CO2 25 12/11/2015 1303   GLUCOSE 98 12/11/2015 1303   BUN 20 12/28/2015   BUN 16 12/11/2015 1303   CREATININE 0.9 12/28/2015   CREATININE 0.80 12/11/2015 1303   CALCIUM 9.3 12/11/2015 1303   PROT 7.0 12/11/2015 1303   ALBUMIN 4.1 12/11/2015 1303   AST 35 12/11/2015 1303   ALT 17 12/11/2015 1303   ALKPHOS 55 12/11/2015 1303   BILITOT 0.7 12/11/2015 1303   GFRNONAA >60 12/11/2015 1303   GFRAA >60 12/11/2015 1303    Assessment and Plan  Pt is going home without my blessing but with help from SNF. He is going home with HH/OT/PT/aid/SW. Medications have been reconciled and Rx's written.   Time spent > 30 min;> 50% of time with patient was spent reviewing records, labs, tests and studies, counseling and developing plan of care- Inocencio Homes, MD

## 2016-01-27 DIAGNOSIS — M6281 Muscle weakness (generalized): Secondary | ICD-10-CM | POA: Diagnosis not present

## 2016-01-27 DIAGNOSIS — G629 Polyneuropathy, unspecified: Secondary | ICD-10-CM | POA: Diagnosis not present

## 2016-01-27 DIAGNOSIS — Z9181 History of falling: Secondary | ICD-10-CM | POA: Diagnosis not present

## 2016-01-27 DIAGNOSIS — K219 Gastro-esophageal reflux disease without esophagitis: Secondary | ICD-10-CM | POA: Diagnosis not present

## 2016-01-27 DIAGNOSIS — C61 Malignant neoplasm of prostate: Secondary | ICD-10-CM | POA: Diagnosis not present

## 2016-01-27 DIAGNOSIS — R2681 Unsteadiness on feet: Secondary | ICD-10-CM | POA: Diagnosis not present

## 2016-01-31 DIAGNOSIS — M6281 Muscle weakness (generalized): Secondary | ICD-10-CM | POA: Diagnosis not present

## 2016-01-31 DIAGNOSIS — Z9181 History of falling: Secondary | ICD-10-CM | POA: Diagnosis not present

## 2016-01-31 DIAGNOSIS — C61 Malignant neoplasm of prostate: Secondary | ICD-10-CM | POA: Diagnosis not present

## 2016-01-31 DIAGNOSIS — R2681 Unsteadiness on feet: Secondary | ICD-10-CM | POA: Diagnosis not present

## 2016-01-31 DIAGNOSIS — K219 Gastro-esophageal reflux disease without esophagitis: Secondary | ICD-10-CM | POA: Diagnosis not present

## 2016-01-31 DIAGNOSIS — G629 Polyneuropathy, unspecified: Secondary | ICD-10-CM | POA: Diagnosis not present

## 2016-02-03 DIAGNOSIS — G629 Polyneuropathy, unspecified: Secondary | ICD-10-CM | POA: Diagnosis not present

## 2016-02-03 DIAGNOSIS — K219 Gastro-esophageal reflux disease without esophagitis: Secondary | ICD-10-CM | POA: Diagnosis not present

## 2016-02-03 DIAGNOSIS — R2681 Unsteadiness on feet: Secondary | ICD-10-CM | POA: Diagnosis not present

## 2016-02-03 DIAGNOSIS — C61 Malignant neoplasm of prostate: Secondary | ICD-10-CM | POA: Diagnosis not present

## 2016-02-03 DIAGNOSIS — Z9181 History of falling: Secondary | ICD-10-CM | POA: Diagnosis not present

## 2016-02-03 DIAGNOSIS — M6281 Muscle weakness (generalized): Secondary | ICD-10-CM | POA: Diagnosis not present

## 2016-02-08 ENCOUNTER — Other Ambulatory Visit: Payer: Self-pay | Admitting: Internal Medicine

## 2016-02-08 DIAGNOSIS — K219 Gastro-esophageal reflux disease without esophagitis: Secondary | ICD-10-CM | POA: Diagnosis not present

## 2016-02-08 DIAGNOSIS — R2681 Unsteadiness on feet: Secondary | ICD-10-CM | POA: Diagnosis not present

## 2016-02-08 DIAGNOSIS — G629 Polyneuropathy, unspecified: Secondary | ICD-10-CM | POA: Diagnosis not present

## 2016-02-08 DIAGNOSIS — Z9181 History of falling: Secondary | ICD-10-CM | POA: Diagnosis not present

## 2016-02-08 DIAGNOSIS — M6281 Muscle weakness (generalized): Secondary | ICD-10-CM | POA: Diagnosis not present

## 2016-02-08 DIAGNOSIS — C61 Malignant neoplasm of prostate: Secondary | ICD-10-CM | POA: Diagnosis not present

## 2016-02-09 DIAGNOSIS — R2681 Unsteadiness on feet: Secondary | ICD-10-CM | POA: Diagnosis not present

## 2016-02-09 DIAGNOSIS — G629 Polyneuropathy, unspecified: Secondary | ICD-10-CM | POA: Diagnosis not present

## 2016-02-09 DIAGNOSIS — K219 Gastro-esophageal reflux disease without esophagitis: Secondary | ICD-10-CM | POA: Diagnosis not present

## 2016-02-09 DIAGNOSIS — M6281 Muscle weakness (generalized): Secondary | ICD-10-CM | POA: Diagnosis not present

## 2016-02-09 DIAGNOSIS — Z9181 History of falling: Secondary | ICD-10-CM | POA: Diagnosis not present

## 2016-02-09 DIAGNOSIS — C61 Malignant neoplasm of prostate: Secondary | ICD-10-CM | POA: Diagnosis not present

## 2016-02-10 DIAGNOSIS — C61 Malignant neoplasm of prostate: Secondary | ICD-10-CM | POA: Diagnosis not present

## 2016-02-10 DIAGNOSIS — K219 Gastro-esophageal reflux disease without esophagitis: Secondary | ICD-10-CM | POA: Diagnosis not present

## 2016-02-10 DIAGNOSIS — M6281 Muscle weakness (generalized): Secondary | ICD-10-CM | POA: Diagnosis not present

## 2016-02-10 DIAGNOSIS — R2681 Unsteadiness on feet: Secondary | ICD-10-CM | POA: Diagnosis not present

## 2016-02-10 DIAGNOSIS — Z9181 History of falling: Secondary | ICD-10-CM | POA: Diagnosis not present

## 2016-02-10 DIAGNOSIS — G629 Polyneuropathy, unspecified: Secondary | ICD-10-CM | POA: Diagnosis not present

## 2016-02-11 ENCOUNTER — Emergency Department (HOSPITAL_COMMUNITY): Payer: Medicare Other

## 2016-02-11 ENCOUNTER — Encounter (HOSPITAL_COMMUNITY): Payer: Self-pay | Admitting: Emergency Medicine

## 2016-02-11 ENCOUNTER — Emergency Department (HOSPITAL_COMMUNITY)
Admission: EM | Admit: 2016-02-11 | Discharge: 2016-02-12 | Disposition: A | Discharge status: Home / Self Care | Payer: Medicare Other | Attending: Emergency Medicine | Admitting: Emergency Medicine

## 2016-02-11 DIAGNOSIS — Z87891 Personal history of nicotine dependence: Secondary | ICD-10-CM | POA: Insufficient documentation

## 2016-02-11 DIAGNOSIS — Y998 Other external cause status: Secondary | ICD-10-CM | POA: Diagnosis not present

## 2016-02-11 DIAGNOSIS — G8929 Other chronic pain: Secondary | ICD-10-CM | POA: Diagnosis not present

## 2016-02-11 DIAGNOSIS — R410 Disorientation, unspecified: Secondary | ICD-10-CM | POA: Diagnosis not present

## 2016-02-11 DIAGNOSIS — F0390 Unspecified dementia without behavioral disturbance: Secondary | ICD-10-CM | POA: Diagnosis not present

## 2016-02-11 DIAGNOSIS — W1839XA Other fall on same level, initial encounter: Secondary | ICD-10-CM | POA: Insufficient documentation

## 2016-02-11 DIAGNOSIS — R42 Dizziness and giddiness: Secondary | ICD-10-CM | POA: Diagnosis not present

## 2016-02-11 DIAGNOSIS — Z043 Encounter for examination and observation following other accident: Secondary | ICD-10-CM | POA: Diagnosis not present

## 2016-02-11 DIAGNOSIS — S0990XA Unspecified injury of head, initial encounter: Secondary | ICD-10-CM | POA: Diagnosis not present

## 2016-02-11 DIAGNOSIS — M545 Low back pain: Secondary | ICD-10-CM | POA: Insufficient documentation

## 2016-02-11 DIAGNOSIS — S199XXA Unspecified injury of neck, initial encounter: Secondary | ICD-10-CM | POA: Diagnosis not present

## 2016-02-11 DIAGNOSIS — K219 Gastro-esophageal reflux disease without esophagitis: Secondary | ICD-10-CM | POA: Diagnosis not present

## 2016-02-11 DIAGNOSIS — R443 Hallucinations, unspecified: Secondary | ICD-10-CM | POA: Diagnosis not present

## 2016-02-11 DIAGNOSIS — Y9289 Other specified places as the place of occurrence of the external cause: Secondary | ICD-10-CM | POA: Insufficient documentation

## 2016-02-11 DIAGNOSIS — T148 Other injury of unspecified body region: Secondary | ICD-10-CM | POA: Diagnosis not present

## 2016-02-11 DIAGNOSIS — W19XXXA Unspecified fall, initial encounter: Secondary | ICD-10-CM

## 2016-02-11 DIAGNOSIS — Y9389 Activity, other specified: Secondary | ICD-10-CM | POA: Insufficient documentation

## 2016-02-11 DIAGNOSIS — Z8546 Personal history of malignant neoplasm of prostate: Secondary | ICD-10-CM | POA: Insufficient documentation

## 2016-02-11 DIAGNOSIS — M25512 Pain in left shoulder: Secondary | ICD-10-CM | POA: Diagnosis not present

## 2016-02-11 DIAGNOSIS — S299XXA Unspecified injury of thorax, initial encounter: Secondary | ICD-10-CM | POA: Diagnosis not present

## 2016-02-11 DIAGNOSIS — Z79899 Other long term (current) drug therapy: Secondary | ICD-10-CM | POA: Diagnosis not present

## 2016-02-11 LAB — CBC WITH DIFFERENTIAL/PLATELET
BASOS PCT: 0 %
Basophils Absolute: 0 10*3/uL (ref 0.0–0.1)
EOS ABS: 0.2 10*3/uL (ref 0.0–0.7)
Eosinophils Relative: 3 %
HEMATOCRIT: 40.9 % (ref 39.0–52.0)
HEMOGLOBIN: 13.2 g/dL (ref 13.0–17.0)
Lymphocytes Relative: 16 %
Lymphs Abs: 1.1 10*3/uL (ref 0.7–4.0)
MCH: 26.3 pg (ref 26.0–34.0)
MCHC: 32.3 g/dL (ref 30.0–36.0)
MCV: 81.6 fL (ref 78.0–100.0)
Monocytes Absolute: 0.4 10*3/uL (ref 0.1–1.0)
Monocytes Relative: 6 %
NEUTROS ABS: 5.3 10*3/uL (ref 1.7–7.7)
NEUTROS PCT: 75 %
Platelets: 282 10*3/uL (ref 150–400)
RBC: 5.01 MIL/uL (ref 4.22–5.81)
RDW: 14.9 % (ref 11.5–15.5)
WBC: 6.9 10*3/uL (ref 4.0–10.5)

## 2016-02-11 LAB — BASIC METABOLIC PANEL
ANION GAP: 14 (ref 5–15)
BUN: 17 mg/dL (ref 6–20)
CALCIUM: 9.2 mg/dL (ref 8.9–10.3)
CHLORIDE: 102 mmol/L (ref 101–111)
CO2: 26 mmol/L (ref 22–32)
CREATININE: 0.82 mg/dL (ref 0.61–1.24)
GFR calc non Af Amer: >60 mL/min (ref >60)
Glucose, Bld: 91 mg/dL (ref 65–99)
Potassium: 3.6 mmol/L (ref 3.5–5.1)
SODIUM: 142 mmol/L (ref 135–145)

## 2016-02-11 LAB — URINALYSIS, ROUTINE W REFLEX MICROSCOPIC
Glucose, UA: NEGATIVE mg/dL
Hgb urine dipstick: NEGATIVE
Ketones, ur: 15 mg/dL — AB
LEUKOCYTES UA: NEGATIVE
NITRITE: NEGATIVE
Protein, ur: NEGATIVE mg/dL
SPECIFIC GRAVITY, URINE: 1.029 (ref 1.005–1.030)
pH: 5.5 (ref 5.0–8.0)

## 2016-02-11 NOTE — Clinical Social Work Note (Signed)
CSW received call to meet with pt and assess for services.  Pt's daughter Hebert Soho was contacted SHE IS NOT ON FACE SHEET HER NUMBER IS [phone].  She reported that pt went to [facility] for rehab in February and did very well.  She believes that when he is at home he does not follow his medications well and reported he has at least 10 medications.  She works and cannot stay with pt or have him stay with her.  CSW called and spoke with pt's ex wife/edith [id number].  She reported that she goes over to pt's house frequently as he has her two dogs staying with him.  She reported that she thought he was in "good mind" and was all right to be able to live independently but agreed that he might be getting confused with his medications.  She agreed that she would check on him when he is discharged and would be available for assistance.  CSW spoke with pt who reported that his problem is his balance and the callous on his foot.  He stated that he has someone who comes into his home 3 days a week but could not provide any additional information. He stated that he did not want to go into any assisted living facility and that his house was going to be paid off next year.  CSW called [facility] to assess which service they might have put into his home after his rehab.  CSW called RN case manager to assist with services.  CSW will provide RN with information.  Dede Query, LCSW [place] Worker - Weekend Coverage cell #: [phone]

## 2016-02-11 NOTE — ED Provider Notes (Signed)
CSN: [id number]     Arrival date & time 02/11/16  1224 History   First MD Initiated Contact with Patient 02/11/16 1227     Chief Complaint  Patient presents with  . Fall  . Hallucinations     (Consider location/radiation/quality/duration/timing/severity/associated sxs/prior Treatment) HPI Evan Charles is a 70 y.o. male with history of chronic back pain, questionable history of dementia, comes in for evaluation after a fall. Patient reports he lives by himself and 2 nights ago, while standing and watching TV, he reports the camera zoomed in and out which caused him to become dizzy. He reports losing his balance and falling to his knees. He denies any head trauma, LOC, vision changes, headache, chest pain, shortness of breath, other focal numbness or weakness. He reports falling again yesterday evening once again reporting the TV as the source of his fall. He reports getting up and having his nephew take him to his daughter's house who also lives in [place]. He reports chronic low back pain, but denies any new musculoskeletal injury or pain now.  Past Medical History  Diagnosis Date  . Back pain   . Colitis   . Prostate cancer ([place])   . GERD (gastroesophageal reflux disease)   . Neuropathy ([place])     " MY HANDS "   Past Surgical History  Procedure Laterality Date  . Prostatectomy    . Laminectomy    . Knee arthroscopy     Family History  Problem Relation Age of Onset  . Cancer Brother   . Cancer Maternal Grandmother    Social History  Substance Use Topics  . Smoking status: Former Smoker    Types: Cigarettes  . Smokeless tobacco: Never Used  . Alcohol Use: No    Review of Systems A 10 point review of systems was completed and was negative except for pertinent positives and negatives as mentioned in the history of present illness     Allergies  Review of patient's allergies indicates no known allergies.  Home Medications   Prior to Admission medications    Medication Sig Start Date End Date Taking? Authorizing Provider  ARIPiprazole (ABILIFY) 20 MG tablet Take 0.5 tablets (10 mg total) by mouth daily. Patient taking differently: Take by mouth daily.  12/16/15   Leo Grosser, MD  cholecalciferol (VITAMIN D) 1000 units tablet Take 1 tablet (1,000 Units total) by mouth daily. 12/16/15   Leo Grosser, MD  docusate sodium (COLACE) 50 MG capsule Take 1 capsule (50 mg total) by mouth 3 (three) times daily as needed for mild constipation. Patient taking differently: Take 50 mg by mouth every 8 (eight) hours as needed for mild constipation.  12/16/15   Leo Grosser, MD  furosemide (LASIX) 40 MG tablet Take 40 mg by mouth daily.    Historical Provider, MD  OLANZapine (ZYPREXA) 5 MG tablet Take 1 tablet (5 mg total) by mouth at bedtime. 12/16/15   Leo Grosser, MD  pantoprazole (PROTONIX) 40 MG tablet Take 1 tablet (40 mg total) by mouth 2 (two) times daily. 12/16/15   Leo Grosser, MD  traMADol (ULTRAM) 50 MG tablet Take 100 mg by mouth every 8 (eight) hours as needed (pain).     Historical Provider, MD  traZODone (DESYREL) 50 MG tablet Take 1 tablet (50 mg total) by mouth at bedtime. 12/16/15   Leo Grosser, MD   BP 101/70 mmHg  Pulse 88  Temp(Src) 97.9 F (36.6 C) (Oral)  Resp 18  SpO2 95% Physical Exam  Constitutional: He is oriented to person, place, and time. He appears well-developed and well-nourished.  African-American male  HENT:  Head: Normocephalic and atraumatic.  Mouth/Throat: Oropharynx is clear and moist.  Eyes: Conjunctivae are normal. Pupils are equal, round, and reactive to light. Right eye exhibits no discharge. Left eye exhibits no discharge. No scleral icterus.  Neck: Neck supple.  Cardiovascular: Normal rate, regular rhythm and normal heart sounds.   Pulmonary/Chest: Effort normal and breath sounds normal. No respiratory distress. He has no wheezes. He has no rales.  Abdominal: Soft. There is no tenderness.  Musculoskeletal: He exhibits  no tenderness.  Moves all extremities with full range of motion and without ataxia. No focal tenderness.  Neurological: He is alert and oriented to person, place, and time.  Cranial Nerves II-XII grossly intact. Motor strength and sensation appear to be baseline for patient. Oriented to person, birthday, address, year and location and situation. Very pleasant  Skin: Skin is warm and dry. No rash noted.  Psychiatric: He has a normal mood and affect.  Follows conversation appropriately, answers questions appropriately.  Nursing note and vitals reviewed.   ED Course  Procedures (including critical care time) Labs Review Labs Reviewed  URINALYSIS, ROUTINE W REFLEX MICROSCOPIC (NOT AT [facility]) - Abnormal; Notable for the following:    Bilirubin Urine SMALL (*)    Ketones, ur 15 (*)    All other components within normal limits  BASIC METABOLIC PANEL  CBC WITH DIFFERENTIAL/PLATELET    Imaging Review Dg Chest 2 View  02/11/2016  CLINICAL DATA:  Visual hallucinations, fall, dizziness, prior smoker EXAM: CHEST  2 VIEW COMPARISON:  12/11/2015 FINDINGS: Chronic left basilar scarring and left hemidiaphragm elevation as before. Stable borderline heart size with normal vascularity. No superimposed pneumonia, collapse or consolidation. Negative for edema, effusion or pneumothorax. Trachea midline. Aorta is mildly tortuous. Degenerative changes of the spine. IMPRESSION: Stable chest exam.  No superimposed acute process. Electronically Signed   By: Jerilynn Mages.  Shick M.D.   On: 02/11/2016 14:43   Ct Head Wo Contrast  02/11/2016  CLINICAL DATA:  Visual hallucinations and falling x2 with dizziness. Hit head and left shoulder on floor. History of prostate cancer. EXAM: CT HEAD WITHOUT CONTRAST CT CERVICAL SPINE WITHOUT CONTRAST TECHNIQUE: Multidetector CT imaging of the head and cervical spine was performed following the standard protocol without intravenous contrast. Multiplanar CT image reconstructions of the cervical  spine were also generated. COMPARISON:  Head CT 12/11/2015 and 12/13/2014 FINDINGS: CT HEAD FINDINGS Ventricles, cisterns and other CSF spaces are within normal. There is no mass, mass effect, shift of midline structures or acute hemorrhage. There is no evidence of acute infarction. Bones soft tissues are within normal. CT CERVICAL SPINE FINDINGS There is mild reversal of the normal cervical lordosis. There is fusion of the C5 and C6 vertebral bodies. There is mild to moderate spondylosis throughout the cervical spine. Moderate disc space narrowing at the C4-5 level and C6-7 levels. Prevertebral soft tissues are within normal. There is moderate uncovertebral joint spurring and facet arthropathy. Non fusion of the midline posterior arch of C1. Moderate severe bilateral neural foraminal narrowing at multiple levels due to adjacent bony spurring. There is no acute fracture or subluxation. Remainder of the exam is unremarkable. IMPRESSION: No acute intracranial findings. No acute cervical spine injury. 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 Moderate to severe bilateral neural foraminal narrowing at multiple levels due to  adjacent bony spurring. Reversal of the normal cervical lordosis. Electronically Signed   By: Marin Olp M.D.   On: 02/11/2016 14:23   Ct Cervical Spine Wo Contrast  02/11/2016  CLINICAL DATA:  Visual hallucinations and falling x2 with dizziness. Hit head and left shoulder on floor. History of prostate cancer. EXAM: CT HEAD WITHOUT CONTRAST CT CERVICAL SPINE WITHOUT CONTRAST TECHNIQUE: Multidetector CT imaging of the head and cervical spine was performed following the standard protocol without intravenous contrast. Multiplanar CT image reconstructions of the cervical spine were also generated. COMPARISON:  Head CT 12/11/2015 and 12/13/2014 FINDINGS: CT HEAD FINDINGS Ventricles, cisterns and other CSF spaces are  within normal. There is no mass, mass effect, shift of midline structures or acute hemorrhage. There is no evidence of acute infarction. Bones soft tissues are within normal. CT CERVICAL SPINE FINDINGS There is mild reversal of the normal cervical lordosis. There is fusion of the C5 and C6 vertebral bodies. There is mild to moderate spondylosis throughout the cervical spine. Moderate disc space narrowing at the C4-5 level and C6-7 levels. Prevertebral soft tissues are within normal. There is moderate uncovertebral joint spurring and facet arthropathy. Non fusion of the midline posterior arch of C1. Moderate severe bilateral neural foraminal narrowing at multiple levels due to adjacent bony spurring. There is no acute fracture or subluxation. Remainder of the exam is unremarkable. IMPRESSION: No acute intracranial findings. No acute cervical spine injury. 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 Moderate to severe bilateral neural foraminal narrowing at multiple levels due to adjacent bony spurring. Reversal of the normal cervical lordosis. Electronically Signed   By: Marin Olp M.D.   On: 02/11/2016 14:23   I have personally reviewed and evaluated these images and lab results as part of my medical decision-making.   EKG Interpretation None     Meds given in ED:  Medications - No data to display  New Prescriptions   No medications on file   Filed Vitals:   02/11/16 1700 02/11/16 1837 02/11/16 2244 02/12/16 0248  BP: 129/93 147/72 111/72 101/70  Pulse:  106 88 88  Temp:  98 F (36.7 C) 97.9 F (36.6 C)   TempSrc:  Oral Oral   Resp:  18 18 18   SpO2:  100% 93% 95%    MDM  ESHAAN KITZMANN is a 70 y.o. male with some evidence of dementia, lives alone at home here for evaluation of fall. Patient is afebrile and hemodynamically stable on arrival. Physical exam is grossly unremarkable. Labs and imaging are  unremarkable. However, patient does exhibit qualities of dementia that appear to have been ongoing for some time according to previous notes.  I think it would be best if patient had evaluation by social work for possible assisted living. Attempted to contact family members, daughter and ex-wife, friend without response. Pending evaluation by social work at this time. Patient care signed out to oncoming provider, Evan Charles, for follow up on Social Work evaluation and subsequent disposition. Final diagnoses:  Fall, initial encounter  Intermittent confusion        Comer Locket, Charles 02/12/16 [id number]  Malvin Johns, MD 02/12/16 [phone]

## 2016-02-11 NOTE — ED Notes (Signed)
SW at bedside.

## 2016-02-11 NOTE — ED Notes (Signed)
Meal given

## 2016-02-11 NOTE — ED Notes (Addendum)
Pt arrived via EMS with report of visual hallucinations and falling x2 s/p to dizziness. Pt reported hitting head and lt shoulder, lower back and lt hip pain but denies visual disturbances, nausea, LOC and dizziness. NIH scale 0

## 2016-02-11 NOTE — ED Notes (Signed)
Pt lives alone. Pt reported that he does not have a key to his home but he is able to get in through the back door. Pt has an unsteady gait and requires 1 assist with ADL's. Pt has an body odor and noted incontinent of bowels/bladder on arrival to facility. Pt reported that he ambulates with walker. Pt eaten 100% of meal.

## 2016-02-11 NOTE — ED Notes (Signed)
Bed: [id number] Expected date: 02/11/16 Expected time: 12:14 PM Means of arrival: Ambulance Comments: fall

## 2016-02-11 NOTE — ED Notes (Signed)
Patient transported to CT 

## 2016-02-11 NOTE — ED Notes (Signed)
Will update vitals once PT is done with meal

## 2016-02-12 DIAGNOSIS — R259 Unspecified abnormal involuntary movements: Secondary | ICD-10-CM | POA: Diagnosis not present

## 2016-02-12 MED ORDER — TRAMADOL HCL 50 MG PO TABS
100.0000 mg | ORAL_TABLET | Freq: Three times a day (TID) | ORAL | Status: DC | PRN
  Administered 2016-02-12: 100 mg via ORAL
  Filled 2016-02-12: qty 2

## 2016-02-12 MED ORDER — ARIPIPRAZOLE 10 MG PO TABS
10.0000 mg | ORAL_TABLET | Freq: Every day | ORAL | Status: DC
  Administered 2016-02-12: 10 mg via ORAL
  Filled 2016-02-12 (×2): qty 1

## 2016-02-12 MED ORDER — DOXYCYCLINE HYCLATE 100 MG PO TABS
100.0000 mg | ORAL_TABLET | Freq: Two times a day (BID) | ORAL | Status: DC
  Administered 2016-02-12: 100 mg via ORAL
  Filled 2016-02-12: qty 1

## 2016-02-12 MED ORDER — NAPROXEN 500 MG PO TABS
500.0000 mg | ORAL_TABLET | Freq: Two times a day (BID) | ORAL | Status: DC
  Administered 2016-02-12: 500 mg via ORAL
  Filled 2016-02-12: qty 1

## 2016-02-12 MED ORDER — TRAZODONE HCL 50 MG PO TABS: 50.0000 mg | ORAL_TABLET | Freq: Every day | ORAL | Status: DC

## 2016-02-12 MED ORDER — FUROSEMIDE 40 MG PO TABS
40.0000 mg | ORAL_TABLET | Freq: Every day | ORAL | Status: DC
  Administered 2016-02-12: 40 mg via ORAL
  Filled 2016-02-12: qty 1

## 2016-02-12 NOTE — ED Notes (Signed)
Tried to call pt's daughter Leonia Corona [phone]) to update her on pt's discharge I did not get an answer but left a message in this regard.

## 2016-02-12 NOTE — Care Management Note (Signed)
Case Management Note  Patient Details  Name: Evan Charles MRN: [id number] Date of Birth: Jan 25, 1946  Subjective/Objective:     Dizziness, ticks                Action/Plan: Discharge Planning: AVS reviewed:   NCM spoke to pt at bedside, gave permission to speak to dtr, Tia or ex-wife. States his ex-wife, Evan Charles # [phone] will open the door for him to get in his home. CSW for transport to home. Pt is active with [facility] for [facility]. Contacted [facility] to make aware of home situation and ticks. Pt states he has RW at home. Lives at home alone. Pt was recently dc for SNF-[facility] were he had rehab. NCM contacted Evan Charles # [phone]. States she will contact her sister, Evan Charles to follow up on pt at home this evening. Made her aware of ticks.    Expected Discharge Date:     02/12/2016           Expected Discharge Plan:  [place]  In-House Referral:  Clinical Social Work  Discharge planning Services  CM Consult  Post Acute Care Choice:  Home Health Choice offered to:  Patient, Adult Children  DME Arranged:  N/A DME Agency:  NA  HH Arranged:  PT HH Agency:  [facility]  Status of Service:  Completed, signed off  Medicare Important Message Given:    Date Medicare IM Given:    Medicare IM give by:    Date Additional Medicare IM Given:    Additional Medicare Important Message give by:     If discussed at [place] of Stay Meetings, dates discussed:    Additional Comments:  Erenest Rasher, RN 02/12/2016, 4:44 PM

## 2016-02-12 NOTE — ED Notes (Signed)
Received a phone call from pt's daughter Leonia Corona) who identified herself as power of attorney in regards to pt's health. I requested her to come in tomorrow and aim to meet with social work to help find placement for his dad, endorsing that the fact that he is living by himself is not in the best interest of his health. She promised she would try to make it to here before 5pm although I indicated that might on late side being on a Sunday. I suggested she aims for no later 3pm.

## 2016-02-12 NOTE — ED Notes (Signed)
Pt asleep in stretcher. Rise and fall of chest noted. In NAD. Will continue to assess.

## 2016-02-12 NOTE — Clinical Social Work Note (Signed)
CSW reviewed pt chart reflecting that pt's daughter [name] had called and spoke with RN last night and would be coming into hospital today to plan discharge.  CSW called [name] and left message for a call back to coordinate discharge services.  Dede Query, LCSW [place] Worker - Weekend Coverage cell #: [phone]

## 2016-02-12 NOTE — Clinical Social Work Note (Signed)
Clinical Social Work Assessment  Patient Details  Name: Evan Charles MRN: [id number] Date of Birth: October 15, 1946  Date of referral:  02/12/16               Reason for consult:                   Permission sought to share information with:  Family Supports Permission granted to share information::  Yes, Verbal Permission Granted  Name::        Agency::     Relationship::     Contact Information:     Housing/Transportation Living arrangements for the past 2 months:  Single Family Home Source of Information:  Patient, Other (Comment Required) (ex wife) Patient Interpreter Needed:    Criminal Activity/Legal Involvement Pertinent to Current Situation/Hospitalization:    Significant Relationships:  Adult Children, Other(Comment) (ex wife) Lives with:  Self Do you feel safe going back to the place where you live?  Yes Need for family participation in patient care:  Yes (Comment)  Care giving concerns:  Pt's daughter Hebert Soho would like pt to consider assisted living   Social Worker assessment / plan:  CSW met with pt at bedside to discuss services.  CSW also called and spoke with pt's daughter Hebert Soho  And also pt's ex wife Ms Ouida Sills.  CSW prompted pt to discuss history and needs.  CSW provided options for placements and services in the community.  CSW coordinated pt's ex wife and daughter Hebert Soho to help pt when discharged with tackling his tick infestation.  CSW confirmed with case manager that [facility] is working in home with pt on PT  Employment status:  Retired Nurse, adult PT Recommendations:  Not assessed at this time Information / Referral to community resources:     Patient/Family's Response to care:  Pt oriented x4 and discussed living alone.  He reported that his main issues with coming back to the ED is with his balance and the caulous on his foot.  Pt reported that he has services in his home working with him 3 days a week [facility] was confirmed).  Pt  reported that he thinks about retirement community or assisted living but does not want to leave his home at this time.  Pt reported that his two daughters try and help him but one has a drinking problem Selinda Eon).  Pt ready to be discharged and made aware of the tics in his body stated that he knows that his carpets must have it and he knew he had to deal with a company to help with exterminating when he discharges.  Pt's ex wife agreed to help facilitate exterminating services and also speak with pt about going into some kind of facility or community where he can receive more assistance.  Pt's daughter Hebert Soho reported that she also will speak to pt about a facility or supportive living environment.  Pt's daughter [name] did not call back CSW after numerous calls.   Patient/Family's Understanding of and Emotional Response to Diagnosis, Current Treatment, and Prognosis:  It is unclear how much pt realizes his health issues but he displayed a good attitude about his life, his future and understanding that he needed to consider more about supportive living.  Emotional Assessment Appearance:  Appears stated age Attitude/Demeanor/Rapport:   (cooperative) Affect (typically observed):  Accepting Orientation:  Oriented to Self, Oriented to Place, Oriented to  Time, Oriented to Situation Alcohol / Substance use:    Psych involvement (Current and /  or in the community):     Discharge Needs  Concerns to be addressed:    Readmission within the last 30 days:    Current discharge risk:    Barriers to Discharge:      Caswell Corwin 02/12/2016, 4:53 PM

## 2016-02-12 NOTE — ED Notes (Signed)
Report received from [facility]

## 2016-02-12 NOTE — ED Notes (Signed)
In room to assess patient.  Changed patient's brief.  Noted to be around 40 ticks on patient in different sizes.  Largest approximately the 1cm.

## 2016-02-12 NOTE — ED Notes (Signed)
Pt awake and alert at this time. Eating breakfast tray. Pending social work consult at this time. In NAD.

## 2016-02-13 DIAGNOSIS — R2681 Unsteadiness on feet: Secondary | ICD-10-CM | POA: Diagnosis not present

## 2016-02-13 DIAGNOSIS — Z9181 History of falling: Secondary | ICD-10-CM | POA: Diagnosis not present

## 2016-02-13 DIAGNOSIS — M6281 Muscle weakness (generalized): Secondary | ICD-10-CM | POA: Diagnosis not present

## 2016-02-13 DIAGNOSIS — C61 Malignant neoplasm of prostate: Secondary | ICD-10-CM | POA: Diagnosis not present

## 2016-02-13 DIAGNOSIS — G629 Polyneuropathy, unspecified: Secondary | ICD-10-CM | POA: Diagnosis not present

## 2016-02-13 DIAGNOSIS — K219 Gastro-esophageal reflux disease without esophagitis: Secondary | ICD-10-CM | POA: Diagnosis not present

## 2016-02-13 LAB — B. BURGDORFI ANTIBODIES

## 2016-02-14 LAB — ROCKY MTN SPOTTED FVR ABS PNL(IGG+IGM)
RMSF IGG: POSITIVE — AB
RMSF IgM: 0.25 index (ref 0.00–0.89)

## 2016-02-14 LAB — RMSF, IGG, IFA: RMSF, IGG, IFA: <1:64 {titer}

## 2016-02-15 DIAGNOSIS — M6281 Muscle weakness (generalized): Secondary | ICD-10-CM | POA: Diagnosis not present

## 2016-02-15 DIAGNOSIS — Z9181 History of falling: Secondary | ICD-10-CM | POA: Diagnosis not present

## 2016-02-15 DIAGNOSIS — K219 Gastro-esophageal reflux disease without esophagitis: Secondary | ICD-10-CM | POA: Diagnosis not present

## 2016-02-15 DIAGNOSIS — G629 Polyneuropathy, unspecified: Secondary | ICD-10-CM | POA: Diagnosis not present

## 2016-02-15 DIAGNOSIS — R2681 Unsteadiness on feet: Secondary | ICD-10-CM | POA: Diagnosis not present

## 2016-02-15 DIAGNOSIS — C61 Malignant neoplasm of prostate: Secondary | ICD-10-CM | POA: Diagnosis not present

## 2016-02-17 ENCOUNTER — Encounter (HOSPITAL_COMMUNITY): Payer: Self-pay

## 2016-02-17 ENCOUNTER — Emergency Department (HOSPITAL_COMMUNITY)
Admission: EM | Admit: 2016-02-17 | Discharge: 2016-02-18 | Disposition: A | Discharge status: Home / Self Care | Payer: Medicare Other | Attending: Emergency Medicine | Admitting: Emergency Medicine

## 2016-02-17 DIAGNOSIS — Y92009 Unspecified place in unspecified non-institutional (private) residence as the place of occurrence of the external cause: Secondary | ICD-10-CM | POA: Insufficient documentation

## 2016-02-17 DIAGNOSIS — R600 Localized edema: Secondary | ICD-10-CM | POA: Diagnosis not present

## 2016-02-17 DIAGNOSIS — Z043 Encounter for examination and observation following other accident: Secondary | ICD-10-CM | POA: Insufficient documentation

## 2016-02-17 DIAGNOSIS — I10 Essential (primary) hypertension: Secondary | ICD-10-CM | POA: Insufficient documentation

## 2016-02-17 DIAGNOSIS — Y9389 Activity, other specified: Secondary | ICD-10-CM | POA: Insufficient documentation

## 2016-02-17 DIAGNOSIS — Z87891 Personal history of nicotine dependence: Secondary | ICD-10-CM | POA: Insufficient documentation

## 2016-02-17 DIAGNOSIS — Z79899 Other long term (current) drug therapy: Secondary | ICD-10-CM | POA: Diagnosis not present

## 2016-02-17 DIAGNOSIS — Z8546 Personal history of malignant neoplasm of prostate: Secondary | ICD-10-CM | POA: Insufficient documentation

## 2016-02-17 DIAGNOSIS — R42 Dizziness and giddiness: Secondary | ICD-10-CM | POA: Diagnosis not present

## 2016-02-17 DIAGNOSIS — Z791 Long term (current) use of non-steroidal anti-inflammatories (NSAID): Secondary | ICD-10-CM | POA: Insufficient documentation

## 2016-02-17 DIAGNOSIS — Y998 Other external cause status: Secondary | ICD-10-CM | POA: Insufficient documentation

## 2016-02-17 DIAGNOSIS — Z471 Aftercare following joint replacement surgery: Secondary | ICD-10-CM | POA: Diagnosis not present

## 2016-02-17 DIAGNOSIS — Z8719 Personal history of other diseases of the digestive system: Secondary | ICD-10-CM | POA: Diagnosis not present

## 2016-02-17 DIAGNOSIS — Z8669 Personal history of other diseases of the nervous system and sense organs: Secondary | ICD-10-CM | POA: Diagnosis not present

## 2016-02-17 DIAGNOSIS — W19XXXA Unspecified fall, initial encounter: Secondary | ICD-10-CM

## 2016-02-17 DIAGNOSIS — W1839XA Other fall on same level, initial encounter: Secondary | ICD-10-CM | POA: Diagnosis not present

## 2016-02-17 DIAGNOSIS — F0390 Unspecified dementia without behavioral disturbance: Secondary | ICD-10-CM | POA: Diagnosis not present

## 2016-02-17 DIAGNOSIS — Z96651 Presence of right artificial knee joint: Secondary | ICD-10-CM | POA: Diagnosis not present

## 2016-02-17 DIAGNOSIS — R404 Transient alteration of awareness: Secondary | ICD-10-CM | POA: Diagnosis not present

## 2016-02-17 HISTORY — DX: Mental disorder, not otherwise specified: F99

## 2016-02-17 HISTORY — DX: Essential (primary) hypertension: I10

## 2016-02-17 LAB — COMPREHENSIVE METABOLIC PANEL
ALBUMIN: 4.5 g/dL (ref 3.5–5.0)
ALT: 34 U/L (ref 17–63)
AST: 57 U/L — AB (ref 15–41)
Alkaline Phosphatase: 58 U/L (ref 38–126)
Anion gap: 13 (ref 5–15)
BILIRUBIN TOTAL: 0.8 mg/dL (ref 0.3–1.2)
BUN: 16 mg/dL (ref 6–20)
CO2: 26 mmol/L (ref 22–32)
Calcium: 9.3 mg/dL (ref 8.9–10.3)
Chloride: 101 mmol/L (ref 101–111)
Creatinine, Ser: 0.86 mg/dL (ref 0.61–1.24)
GFR calc Af Amer: >60 mL/min (ref >60)
GFR calc non Af Amer: >60 mL/min (ref >60)
GLUCOSE: 78 mg/dL (ref 65–99)
POTASSIUM: 3.5 mmol/L (ref 3.5–5.1)
Sodium: 140 mmol/L (ref 135–145)
TOTAL PROTEIN: 7.7 g/dL (ref 6.5–8.1)

## 2016-02-17 LAB — CBC WITH DIFFERENTIAL/PLATELET
BASOS ABS: 0 10*3/uL (ref 0.0–0.1)
Basophils Relative: 1 %
EOS PCT: 2 %
Eosinophils Absolute: 0.1 10*3/uL (ref 0.0–0.7)
HCT: 41.2 % (ref 39.0–52.0)
Hemoglobin: 13.1 g/dL (ref 13.0–17.0)
LYMPHS ABS: 1.2 10*3/uL (ref 0.7–4.0)
LYMPHS PCT: 18 %
MCH: 26.5 pg (ref 26.0–34.0)
MCHC: 31.8 g/dL (ref 30.0–36.0)
MCV: 83.2 fL (ref 78.0–100.0)
MONO ABS: 0.5 10*3/uL (ref 0.1–1.0)
MONOS PCT: 8 %
Neutro Abs: 4.8 10*3/uL (ref 1.7–7.7)
Neutrophils Relative %: 73 %
PLATELETS: 323 10*3/uL (ref 150–400)
RBC: 4.95 MIL/uL (ref 4.22–5.81)
RDW: 14.8 % (ref 11.5–15.5)
WBC: 6.5 10*3/uL (ref 4.0–10.5)

## 2016-02-17 LAB — URINALYSIS, ROUTINE W REFLEX MICROSCOPIC
BILIRUBIN URINE: NEGATIVE
GLUCOSE, UA: NEGATIVE mg/dL
HGB URINE DIPSTICK: NEGATIVE
KETONES UR: 15 mg/dL — AB
Leukocytes, UA: NEGATIVE
Nitrite: NEGATIVE
PROTEIN: NEGATIVE mg/dL
Specific Gravity, Urine: 1.015 (ref 1.005–1.030)
pH: 5.5 (ref 5.0–8.0)

## 2016-02-17 MED ORDER — DOCUSATE SODIUM 50 MG PO CAPS
50.0000 mg | ORAL_CAPSULE | Freq: Three times a day (TID) | ORAL | Status: DC | PRN
  Filled 2016-02-17: qty 1

## 2016-02-17 MED ORDER — PANTOPRAZOLE SODIUM 40 MG PO TBEC
40.0000 mg | DELAYED_RELEASE_TABLET | Freq: Two times a day (BID) | ORAL | Status: DC
  Administered 2016-02-17 – 2016-02-18 (×3): 40 mg via ORAL
  Filled 2016-02-17 (×3): qty 1

## 2016-02-17 MED ORDER — ARIPIPRAZOLE 10 MG PO TABS: 10.0000 mg | ORAL_TABLET | Freq: Every day | ORAL | Status: DC

## 2016-02-17 MED ORDER — ARIPIPRAZOLE 10 MG PO TABS
10.0000 mg | ORAL_TABLET | Freq: Every day | ORAL | Status: DC
  Administered 2016-02-17 – 2016-02-18 (×2): 10 mg via ORAL
  Filled 2016-02-17 (×2): qty 1

## 2016-02-17 MED ORDER — FUROSEMIDE 40 MG PO TABS
40.0000 mg | ORAL_TABLET | Freq: Every day | ORAL | Status: DC
  Administered 2016-02-17 – 2016-02-18 (×2): 40 mg via ORAL
  Filled 2016-02-17 (×2): qty 1

## 2016-02-17 MED ORDER — TRAZODONE HCL 50 MG PO TABS
50.0000 mg | ORAL_TABLET | Freq: Every day | ORAL | Status: DC
  Administered 2016-02-17: 50 mg via ORAL
  Filled 2016-02-17: qty 1

## 2016-02-17 MED ORDER — VITAMIN D3 25 MCG (1000 UNIT) PO TABS
1000.0000 [IU] | ORAL_TABLET | Freq: Every day | ORAL | Status: DC
  Administered 2016-02-18: 1000 [IU] via ORAL
  Filled 2016-02-17: qty 1

## 2016-02-17 MED ORDER — OLANZAPINE 5 MG PO TABS
5.0000 mg | ORAL_TABLET | Freq: Every day | ORAL | Status: DC
  Administered 2016-02-17: 5 mg via ORAL
  Filled 2016-02-17: qty 1

## 2016-02-17 NOTE — ED Notes (Signed)
Bed: WA10 Expected date:  Expected time:  Means of arrival:  Comments: EMS fall 

## 2016-02-17 NOTE — ED Notes (Signed)
Evan Charles [phone] Would like to be called if admitted or discharged-if discharged he can come get patient and bring him home

## 2016-02-17 NOTE — Progress Notes (Addendum)
EDRN reported to [facility] that patient has active ticks (bugs), crawling on him.  [place] notified EDSW.  Per EDSW, SNF will not accept patient with active ticks.  EDCM notified EDP of active ticks and he will speak to [facility].  If patient is accepted to a facility, they MUST be told that patient has active ticks.  If patient is not accepted to a facility due to tick issue, patient will have to be discharged home.  Please have EDP place orders for home health services RN, PT, OT aide and social worker with face to face prior to discharge.    Per day shift EDSW note.  APS report has been filed.  EDCM spoke to patient's daughter Tia regarding the ticks.  She reports she will look into this issue tomorrow.  EDCM also made her aware that facility may not accept patient with active tick issue.

## 2016-02-17 NOTE — NC FL2 (Signed)
[place] LEVEL OF CARE SCREENING TOOL     IDENTIFICATION  Patient Name: Evan Charles Birthdate: 10/17/1946 Sex: male Admission Date (Current Location): 02/17/2016  [facility] and [place] Number:  Herbalist and Address:  [facility],  [place] [address] [place]      Provider Number: [id number]  Attending Physician Name and Address:  Provider Default, MD  Relative Name and Phone Number:       Current Level of Care: Hospital Recommended Level of Care: [place] Prior Approval Number:    Date Approved/Denied:   PASRR Number:  ([id number])  Discharge Plan: SNF    Current Diagnoses: Patient Active Problem List   Diagnosis Date Noted  . 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 GERD without esophagitis 12/19/2015  . Bilateral lower extremity edema 12/19/2015  . Intractable low back pain 02/14/2015  . Difficulty walking 02/14/2015  . Prostate cancer ([place]) 02/14/2015  . H/O: upper GI bleed   . Chronic back pain   . GI bleed 02/13/2015  . Visual hallucinations   . Hallucinations 11/02/2014  . Psychoses     Orientation RESPIRATION BLADDER Height & Weight     Self    Incontinent Weight:   Height:     BEHAVIORAL SYMPTOMS/MOOD NEUROLOGICAL BOWEL NUTRITION STATUS      Incontinent Diet (regular)  AMBULATORY STATUS COMMUNICATION OF NEEDS Skin   Limited Assist Verbally Normal                       Personal Care Assistance Level of Assistance  Bathing, Dressing Bathing Assistance: Limited assistance   Dressing Assistance: Limited assistance     Functional Limitations Info             SPECIAL CARE FACTORS FREQUENCY  PT (By licensed PT), OT (By licensed OT)                    Contractures Contractures Info: Not present    Additional Factors Info  Psychotropic, Code Status Code Status Info:  (full code)   Psychotropic Info:  (zyprexa)         Current Medications  (02/17/2016):  This is the current hospital active medication list Current Facility-Administered Medications  Medication Dose Route Frequency Provider Last Rate Last Dose  . ARIPiprazole (ABILIFY) tablet 10 mg  10 mg Oral Daily Orlie Dakin, MD      . cholecalciferol (VITAMIN D) tablet 1,000 Units  1,000 Units Oral Daily Orlie Dakin, MD      . docusate sodium (COLACE) capsule 50 mg  50 mg Oral TID PRN Orlie Dakin, MD      . furosemide (LASIX) tablet 40 mg  40 mg Oral Daily Orlie Dakin, MD   40 mg at 02/17/16 1808  . OLANZapine (ZYPREXA) tablet 5 mg  5 mg Oral QHS Orlie Dakin, MD      . pantoprazole (PROTONIX) EC tablet 40 mg  40 mg Oral BID Orlie Dakin, MD   40 mg at 02/17/16 1808  . traZODone (DESYREL) tablet 50 mg  50 mg Oral QHS Orlie Dakin, MD       Current Outpatient Prescriptions  Medication Sig Dispense Refill  . ARIPiprazole (ABILIFY) 20 MG tablet Take 0.5 tablets (10 mg total) by mouth daily. 15 tablet 0  . cholecalciferol (VITAMIN D) 1000 units tablet Take 1 tablet (1,000 Units total) by mouth daily. (Patient not taking: Reported on 02/12/2016) 30 tablet  0  . docusate sodium (COLACE) 50 MG capsule Take 1 capsule (50 mg total) by mouth 3 (three) times daily as needed for mild constipation. (Patient not taking: Reported on 02/12/2016) 30 capsule 0  . furosemide (LASIX) 40 MG tablet Take 40 mg by mouth daily.    . naproxen (NAPROSYN) 500 MG tablet Take 500 mg by mouth 2 (two) times daily with a meal.    . OLANZapine (ZYPREXA) 5 MG tablet Take 1 tablet (5 mg total) by mouth at bedtime. (Patient not taking: Reported on 02/12/2016) 30 tablet 0  . pantoprazole (PROTONIX) 40 MG tablet Take 1 tablet (40 mg total) by mouth 2 (two) times daily. (Patient not taking: Reported on 02/12/2016) 60 tablet 0  . traMADol (ULTRAM) 50 MG tablet Take 100 mg by mouth every 8 (eight) hours as needed for moderate pain or severe pain.     . traZODone (DESYREL) 50 MG tablet Take 1 tablet (50 mg total)  by mouth at bedtime. 30 tablet 0  . urea (CARMOL) 20 % cream Apply 1 application topically daily. Apply moderate amount to affected area every day       Discharge Medications: Please see discharge summary for a list of discharge medications.  Relevant Imaging Results:  Relevant Lab Results:   Additional Information    Cleotis Sparr M, LCSW

## 2016-02-17 NOTE — ED Provider Notes (Signed)
CSN: [id number]     Arrival date & time 02/17/16  1117 History   First MD Initiated Contact with Patient 02/17/16 1136     Chief Complaint  Patient presents with  . Fall  . Dizziness     (Consider location/radiation/quality/duration/timing/severity/associated sxs/prior Treatment) HPI Level V caveat dementia. History is obtained from patient and from old hospital records. I attempted to call patient's home, no number Patient reportedly leaned against a wall and slid to the ground earlier today. He presently denies any complaint. He has recurrent falls. He denies any specific complaint presently. Past Medical History  Diagnosis Date  . Back pain   . Colitis   . Prostate cancer ([place])   . GERD (gastroesophageal reflux disease)   . Neuropathy ([place])     " MY HANDS "  . Hypertension   . Chronic mental illness    Past Surgical History  Procedure Laterality Date  . Prostatectomy    . Laminectomy    . Knee arthroscopy     Family History  Problem Relation Age of Onset  . Cancer Brother   . Cancer Maternal Grandmother    Social History  Substance Use Topics  . Smoking status: Former Smoker    Types: Cigarettes  . Smokeless tobacco: Never Used  . Alcohol Use: No    Review of Systems  Unable to perform ROS: Dementia      Allergies  Review of patient's allergies indicates no known allergies.  Home Medications   Prior to Admission medications   Medication Sig Start Date End Date Taking? Authorizing Provider  ARIPiprazole (ABILIFY) 20 MG tablet Take 0.5 tablets (10 mg total) by mouth daily. 12/16/15   Leo Grosser, MD  cholecalciferol (VITAMIN D) 1000 units tablet Take 1 tablet (1,000 Units total) by mouth daily. Patient not taking: Reported on 02/12/2016 12/16/15   Leo Grosser, MD  docusate sodium (COLACE) 50 MG capsule Take 1 capsule (50 mg total) by mouth 3 (three) times daily as needed for mild constipation. Patient not taking: Reported on 02/12/2016 12/16/15   Leo Grosser, MD   furosemide (LASIX) 40 MG tablet Take 40 mg by mouth daily.    Historical Provider, MD  naproxen (NAPROSYN) 500 MG tablet Take 500 mg by mouth 2 (two) times daily with a meal.    Historical Provider, MD  OLANZapine (ZYPREXA) 5 MG tablet Take 1 tablet (5 mg total) by mouth at bedtime. Patient not taking: Reported on 02/12/2016 12/16/15   Leo Grosser, MD  pantoprazole (PROTONIX) 40 MG tablet Take 1 tablet (40 mg total) by mouth 2 (two) times daily. Patient not taking: Reported on 02/12/2016 12/16/15   Leo Grosser, MD  traMADol (ULTRAM) 50 MG tablet Take 100 mg by mouth every 8 (eight) hours as needed for moderate pain or severe pain.     Historical Provider, MD  traZODone (DESYREL) 50 MG tablet Take 1 tablet (50 mg total) by mouth at bedtime. 12/16/15   Leo Grosser, MD  urea (CARMOL) 20 % cream Apply 1 application topically daily. Apply moderate amount to affected area every day    Historical Provider, MD   BP 107/88 mmHg  Pulse 85  Temp(Src) 97.5 F (36.4 C) (Oral)  Resp 18  SpO2 100% Physical Exam  Constitutional: He appears well-developed and well-nourished.  HENT:  Head: Normocephalic and atraumatic.  Eyes: Conjunctivae are normal. Pupils are equal, round, and reactive to light.  Neck: Neck supple. No tracheal deviation present. No thyromegaly present.  Cardiovascular: Normal rate  and regular rhythm.   No murmur heard. Pulmonary/Chest: Effort normal and breath sounds normal.  Abdominal: Soft. Bowel sounds are normal. He exhibits no distension. There is no tenderness.  Musculoskeletal: Normal range of motion. He exhibits edema. He exhibits no tenderness.  2+ pretibial pitting edema bilaterally , flexion contracture of left arm  Neurological: He is alert. No cranial nerve deficit. Coordination normal.  Oriented to name and hospital and year does not know month, he does know the president. Cranial nerves II through XII grossly intact. He is unable to stand on his own.  Skin: Skin is warm and  dry. No rash noted.  Multiple ticks a adherent skin. Takes not engorged. No rash  Psychiatric: He has a normal mood and affect.  Nursing note and vitals reviewed.   ED Course  Procedures (including critical care time) Labs Review Labs Reviewed - No data to display  Imaging Review No results found. I have personally reviewed and evaluated these images and lab results as part of my medical decision-making.   EKG Interpretation None     3 PM I spoke with patient's nephew Toma Copier white at [phone]. He reports that he checks on patient regularly as does his ex-wife and 2 adult children. Patient lives alone and is not doing well at home and not able to care for himself per Mr. White. He suggested that his children be called. There is no definite known guardianship. His children's names are Daun Lammie at [phone] and Abinav Clendenen at [phone]. Mr. Dema Severin states he feels that patient belongs in skilled nursing facility. And patient has been amenable to staying at saskilled nursing[facility]ursing facility.  MDM  I did not see any new injuries Social service consulted for nursing home placement.. I suggested to social worker that she contact patient's family members for input.  Final diagnoses:  None  Diagnosis #1 multiple falls #2 dementia      Orlie Dakin, MD 02/17/16 1557

## 2016-02-17 NOTE — Progress Notes (Signed)
Discussed patient with ED charge RN.

## 2016-02-17 NOTE — Progress Notes (Signed)
EDCM called Tim with [facility] for collateral information regarding home situation.  Will call EDCM back with that information.

## 2016-02-17 NOTE — Progress Notes (Signed)
Discussed with RNCM.  CSW familiar with pt/family from previous ED visit in 2/17.  Family unable to provide 24 hour care/assistance to pt and wish for him to be placed.  RNCM spoke with his daughter, Hebert Soho, who confirms that she would like him to return to [facility] if possible.  FL2 has been faxed to [facility] and we await bed offers.  PT/OT c/s are pending.  W/E ED CSW will be updated and will provide f/u in the am.  Creta Levin, LCSW [place] Coverage [id number]

## 2016-02-17 NOTE — ED Provider Notes (Signed)
Approximately 12 takes removed from patient's body. Patient's body examined and cannot see any further evidence of tick infestation.  Lacretia Leigh, MD 02/17/16 2051

## 2016-02-17 NOTE — ED Notes (Signed)
Per [facility]- Pt resides at home- Pt has HX of falls recent visit to [facility] for same concern- Pt c/o of dizziness. Pt states he fell last night however states this am was not a fall. Pt states he leaned up against wall and slide down to floor. NO LOC. Pt states has weakness to left side. This is not new. Pt here for further evaluation. Denies any other complaints.

## 2016-02-17 NOTE — Progress Notes (Addendum)
EDCM spoke to patient's daughter Tia who reports she is not the patient's POA.  Patient does not have a POA.  Tia reports she would like to go to court eventually to become the patient's POA.  Tia reports she works and is difficult to take care of the patient.  She reports patient's other daughters are not very active in patient's care.  She reports patient's nephew checks on the patient almost daily.  She feels that patient requires 24 hour care.  Despite patient being checked in on, the patient continues to fall and is not able to care for himself per Tia.  EDCM informed Tia that patient will be staying overnight in the ED for placement.  She is agreeable to this.  She reports she would like the patient to be faxed out to [facility] as the patient did very well at this facility.  She also does not want the patient to go back to [facility].  EDCM discussed assisting the patient to apply for Medicaid for more long term care options.  Patient's daughter verbalized understanding.  Patient's daughter very thankful for services.  No further EDCm needs at this time.  Patient's daughter also would not like the patient placed at the facility [address] up [address] from the patient as he left this facility on his own and walked home.    Discussed patient's daughter's preferences for rehab facilities with EDSW.

## 2016-02-17 NOTE — ED Notes (Signed)
Called Pharmacy to verify medications

## 2016-02-17 NOTE — Progress Notes (Addendum)
Per Tim with [facility] patient is currently active with them for PT, also adding if patient is agreeable to SNF would be a great choice for patient.  EDCM discussed patient with EDRN who reports patient patient is not even able to come to a seated position on his own, she had to do most of the work.EDCM spoke to patient at bedside.     Patient confirms he has been falling at home, "More than I'd like in my lifetime." Patient reports that if he falls at home and needs to get himself off the floor he crawls to something to help pick himself up.  EDCM discussed SNF for short term rehab with patient.  Patient is agreeable to this.  Patient has given permission to call his daughter Nestor Ramp to discuss placement.  Patient reports his house is almost paid for and would like to return to his home after rehab.  Discussed patient with EDSW Jody.  Discussed patient with EDP regarding PT, OT orders.  Patient to remain in ED for placement to rehab.  No further EDCM needs at this time.    Patient reports he would not like to go back to [facility].  He reports he did not like it there.  Informed patient's RN he would like something to eat.

## 2016-02-17 NOTE — Progress Notes (Addendum)
CSW staffed with EDP. CSW spoke with patient at bedside with no family present. Patient reports he was having trouble with his lower back. Patient reports he fell one night around March 2nd and he is having problems with his shoulder. Patient reports he has fallen three times in the last two months. Patient reports he uses a walker and cane. Patient reports he had a "tick problem" from his dogs. Patient reports someone "got the ticks off today". 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   Patient reports he has three daughters and his daughter Uwe Kois is his POA. Patient reports he has three daughters, however, patient had some confusion on the names. Patient reports his daughters are all married and it would not be feasible to "barge in on them to stay".   Patient reports his family and ex-wife have been speaking with him regarding being placed in a facility. Patient reports he does not feel he is ready to go to a facility at this time. Patient reports he has one friend that he speaks with daily.   EDP states patient is in need of placement. EDP provided contact numbers for daughters, [name] [phone] and Ryanmichael Allshouse [phone].   EDP stated APS report should be called in on patient. CSW staffed with Asst. Social Work Mudlogger. 4:01pm- CSW called APS in attempts to make a report. Name and contact was left for return call. CSW left in the message that the CSW from [facility] may answer the call when returned due to phone being forwarded. No call was received by 4:35pm by [facility] ED CSW. Phone was forwarded to [facility] ED CSW.   Genice Rouge [id number] ED CSW 02/17/2016 4:01 PM

## 2016-02-18 ENCOUNTER — Emergency Department (HOSPITAL_COMMUNITY): Payer: Medicare Other

## 2016-02-18 DIAGNOSIS — Z96651 Presence of right artificial knee joint: Secondary | ICD-10-CM | POA: Diagnosis not present

## 2016-02-18 DIAGNOSIS — R262 Difficulty in walking, not elsewhere classified: Secondary | ICD-10-CM | POA: Diagnosis not present

## 2016-02-18 DIAGNOSIS — R600 Localized edema: Secondary | ICD-10-CM | POA: Diagnosis not present

## 2016-02-18 DIAGNOSIS — Z8719 Personal history of other diseases of the digestive system: Secondary | ICD-10-CM | POA: Diagnosis not present

## 2016-02-18 DIAGNOSIS — K219 Gastro-esophageal reflux disease without esophagitis: Secondary | ICD-10-CM | POA: Diagnosis not present

## 2016-02-18 DIAGNOSIS — Z8669 Personal history of other diseases of the nervous system and sense organs: Secondary | ICD-10-CM | POA: Diagnosis not present

## 2016-02-18 DIAGNOSIS — M6281 Muscle weakness (generalized): Secondary | ICD-10-CM | POA: Diagnosis not present

## 2016-02-18 DIAGNOSIS — Z87891 Personal history of nicotine dependence: Secondary | ICD-10-CM | POA: Diagnosis not present

## 2016-02-18 DIAGNOSIS — K921 Melena: Secondary | ICD-10-CM | POA: Diagnosis not present

## 2016-02-18 DIAGNOSIS — Z043 Encounter for examination and observation following other accident: Secondary | ICD-10-CM | POA: Diagnosis not present

## 2016-02-18 DIAGNOSIS — Z79899 Other long term (current) drug therapy: Secondary | ICD-10-CM | POA: Diagnosis not present

## 2016-02-18 DIAGNOSIS — D500 Iron deficiency anemia secondary to blood loss (chronic): Secondary | ICD-10-CM | POA: Diagnosis not present

## 2016-02-18 DIAGNOSIS — K5900 Constipation, unspecified: Secondary | ICD-10-CM | POA: Diagnosis not present

## 2016-02-18 DIAGNOSIS — Z471 Aftercare following joint replacement surgery: Secondary | ICD-10-CM | POA: Diagnosis not present

## 2016-02-18 DIAGNOSIS — Z791 Long term (current) use of non-steroidal anti-inflammatories (NSAID): Secondary | ICD-10-CM | POA: Diagnosis not present

## 2016-02-18 DIAGNOSIS — K922 Gastrointestinal hemorrhage, unspecified: Secondary | ICD-10-CM | POA: Diagnosis not present

## 2016-02-18 DIAGNOSIS — R609 Edema, unspecified: Secondary | ICD-10-CM | POA: Diagnosis not present

## 2016-02-18 DIAGNOSIS — I10 Essential (primary) hypertension: Secondary | ICD-10-CM | POA: Diagnosis not present

## 2016-02-18 DIAGNOSIS — M545 Low back pain: Secondary | ICD-10-CM | POA: Diagnosis not present

## 2016-02-18 DIAGNOSIS — M810 Age-related osteoporosis without current pathological fracture: Secondary | ICD-10-CM | POA: Diagnosis not present

## 2016-02-18 DIAGNOSIS — L709 Acne, unspecified: Secondary | ICD-10-CM | POA: Diagnosis not present

## 2016-02-18 DIAGNOSIS — R296 Repeated falls: Secondary | ICD-10-CM | POA: Diagnosis not present

## 2016-02-18 DIAGNOSIS — C61 Malignant neoplasm of prostate: Secondary | ICD-10-CM | POA: Diagnosis not present

## 2016-02-18 MED ORDER — TRAMADOL HCL 50 MG PO TABS
50.0000 mg | ORAL_TABLET | Freq: Once | ORAL | Status: AC
  Administered 2016-02-18: 50 mg via ORAL
  Filled 2016-02-18: qty 1

## 2016-02-18 NOTE — ED Notes (Signed)
With PT evaluation pt able to stand with walker assistance with small steps to side of bed. With ambulation assist pt reports acute chronic right knee pain; Little made aware and DG placed to confirm no new injury related to recent fall hx. Pt positioned back in bed post evaluation with PT and given meal tray.

## 2016-02-18 NOTE — Clinical Social Work Note (Signed)
CSW reviewed pt chart reflecting that he had been faxed out to rehab facilities and [facility] had stated they could accept pt.  CSW called and spoke with Santiago Glad at [facility] to assess for bed availability today.  Santiago Glad will check pt insurance as he was at rehab in February of this year for a short time.  CSW met with pt to explore rehab and pt appears to be on board with [facility] if they can accept him today.  [place] to review insurance and get back to CSW.  Dede Query, LCSW [place] Worker - Weekend Coverage cell #: [phone]

## 2016-02-18 NOTE — Clinical Social Work Note (Signed)
Clinical Social Work Assessment  Patient Details  Name: Evan Charles MRN: [id number] Date of Birth: [birth year]  Date of referral:  02/18/16               Reason for consult:  Facility Placement (Per consult, needs nursing home placement)                Permission sought to share information with:  Family Supports, Chartered certified accountant granted to share information::  Yes, Verbal Permission Granted  Name::        Agency::     Relationship::     Contact Information:     Housing/Transportation Living arrangements for the past 2 months:  Single Family Home Source of Information:  Patient Patient Interpreter Needed:  None Criminal Activity/Legal Involvement Pertinent to Current Situation/Hospitalization:    Significant Relationships:  Adult Children, Other(Comment) (ex wife) Lives with:  Self Do you feel safe going back to the place where you live?    Need for family participation in patient care:  Yes (Comment)  Care giving concerns:  No caregiver   Social Worker assessment / plan:  CSW met with pt at bedside to discuss discharge plans.  CSW provided SNF option and explained  Protocol for discharging to SNF.  CSW provided [facility] with information to provide bed offer and facilitated transportation via pt's nephew to  [facility] for pt rehab.  CSW provided pt's daughter Hebert Soho with information and also pt's ex wife. CSW left message for daughter [name] to obtain A call back. CSW provided RN with information regarding discharge to SNF  Employment status:  Retired Nurse, adult PT Recommendations:  Grace / Referral to community resources:     Patient/Family's Response to care:  Pt discussed being in agreement to go to [facility] for short term rehab.  Pt reported that his ex wife had assisted him with taking the dog to the vet to have the ticks removed stating it was a "big job".  Pt stated that the rehab is on his street  so he is glad to be going to this location for his rehab needs.  Patient/Family's Understanding of and Emotional Response to Diagnosis, Current Treatment, and Prognosis: Pt appears to understand that rehab will help him with his balance and strength and hopeful he will be able to stay out of the emergency department.     Emotional Assessment Appearance:  Appears stated age Attitude/Demeanor/Rapport:   (cooperative) Affect (typically observed):  Accepting Orientation:  Oriented to Self, Oriented to Place, Oriented to  Time, Oriented to Situation Alcohol / Substance use:    Psych involvement (Current and /or in the community):     Discharge Needs  Concerns to be addressed:    Readmission within the last 30 days:  Yes (readmit to ED not inpatient) Current discharge risk:    Barriers to Discharge:  No Barriers Identified   Carlean Jews, LCSW 02/18/2016, 2:12 PM

## 2016-02-18 NOTE — Evaluation (Signed)
Physical Therapy Evaluation Patient Details Name: Evan Charles MRN: [id number] DOB: 24-Mar-1946 Today's Date: 02/18/2016   History of Present Illness  70 year old man seen in ED after a fall at home.  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  He tells me he has been receiving homehealth PT  He tells me he is here because he is having problems with his balance   Clinical Impression  [name] is having problems with mobility and gait that is limiting his functional independence.  He worked well with me on initial eval and expect he will benefit from continued PT 5 times a week at a SNF to return to functional bed mobility and  ambulation with a rolling walker     Follow Up Recommendations SNF    Equipment Recommendations  None recommended by PT    Recommendations for Other Services OT consult     Precautions / Restrictions Precautions Precautions: Fall Restrictions Weight Bearing Restrictions: No      Mobility  Bed Mobility Overal bed mobility: Needs Assistance   Rolling: Mod assist (more difficulty rolling to left  due to right back pain ) Sidelying to sit: Mod assist   Sit to supine: Mod assist   General bed mobility comments: pt very slow at first but improved as he moved around more   Transfers Overall transfer level: Needs assistance Equipment used: Rolling walker (2 wheeled) Transfers: Sit to/from Stand Sit to Stand: +2 safety/equipment         General transfer comment: pt neede cues for foot placement.  He tends to keep feet close together and needs verbal and tactile cues   Ambulation/Gait Ambulation/Gait assistance: Mod assist Ambulation Distance (Feet): 2 Feet (sidestep difficulty, pt wanted to step forward ) Assistive device: Rolling walker (2 wheeled) Gait Pattern/deviations: Step-to pattern;Decreased step length - right;Decreased step length - left;Shuffle;Trunk flexed     General Gait  Details: Pt said he needed walker for balance, but still appeared to be in hunched over, protective posture and  kept feet close together and has difficulty stepping to the side   Stairs            Wheelchair Mobility    Modified Rankin (Stroke Patients Only)       Balance Overall balance assessment: Needs assistance Sitting-balance support: Bilateral upper extremity supported;Feet supported Sitting balance-Leahy Scale: Good     Standing balance support: Bilateral upper extremity supported Standing balance-Leahy Scale: Fair                               Pertinent Vitals/Pain Pain Assessment: Faces Faces Pain Scale: Hurts even more Pain Location: back, knee  (pt states he has had this pain for more than 30 years ) Pain Intervention(s): Repositioned    Home Living Family/patient expects to be discharged to:: Skilled nursing facility                 Additional Comments: pt states she has 2 walkers at home, but he doesn't always use them     Prior Function Level of Independence: Independent with assistive device(s)               Hand Dominance        Extremity/Trunk Assessment   Upper Extremity Assessment: Defer to OT evaluation           Lower Extremity  Assessment: RLE deficits/detail;LLE deficits/detail (appears to have edema in both lower legs and ankles) RLE Deficits / Details: pt is able to move all muscle groups, but is slow and strength is about 3/5 , some incooridation noted  (well healed scar top of knee that patient says is from TKR) LLE Deficits / Details: pt has more freedom of active movement with left leg, but still is about 3+/5 in strength      Communication   Communication: No difficulties  Cognition Arousal/Alertness: Awake/alert Behavior During Therapy: WFL for tasks assessed/performed (pt was able to move better as the session went on) Overall Cognitive Status: No family/caregiver present to determine baseline  cognitive functioning (some difficulty following commands )                      General Comments General comments (skin integrity, edema, etc.): Pt has dry skin throughout and has edema in lower legs.     Exercises        Assessment/Plan    PT Assessment Patient needs continued PT services;All further PT needs can be met in the next venue of care  PT Diagnosis Difficulty walking;Abnormality of gait;Generalized weakness   PT Problem List Decreased strength;Decreased balance;Decreased mobility  PT Treatment Interventions DME instruction;Gait training;Functional mobility training;Therapeutic activities;Therapeutic exercise;Balance training;Neuromuscular re-education;Patient/family education   PT Goals (Current goals can be found in the Care Plan section) Acute Rehab PT Goals Patient Stated Goal: to get back up on his feet  PT Goal Formulation: With patient Time For Goal Achievement: 03/03/16 Potential to Achieve Goals: Good    Frequency Min 2X/week   Barriers to discharge Decreased caregiver support      Co-evaluation               End of Session   Activity Tolerance: Patient tolerated treatment well Patient left: in bed;with nursing/sitter in room Nurse Communication: Mobility status    Functional Assessment Tool Used: clincial judgement  Functional Limitation: Changing and maintaining body position Changing and Maintaining Body Position Current Status (N2778): At least 60 percent but less than 80 percent impaired, limited or restricted Changing and Maintaining Body Position Goal Status (E4235): At least 1 percent but less than 20 percent impaired, limited or restricted    Time: 0800-0825 PT Time Calculation (min) (ACUTE ONLY): 25 min   Charges:   PT Evaluation $PT Eval Low Complexity: 1 Procedure     PT G Codes:   PT G-Codes **NOT FOR INPATIENT CLASS** Functional Assessment Tool Used: clincial judgement  Functional Limitation: Changing and  maintaining body position Changing and Maintaining Body Position Current Status (T6144): At least 60 percent but less than 80 percent impaired, limited or restricted Changing and Maintaining Body Position Goal Status (R1540): At least 1 percent but less than 20 percent impaired, limited or restricted  Helene Kelp K. Owens Shark, PT   02/18/2016, 8:49 AM

## 2016-02-18 NOTE — ED Provider Notes (Signed)
Pt evaluated primarily by Dr. Milagros Evener. He complained of knee pain; plain films unremarkable. He was awaiting social work assistance with placement as he has dementia and frequent falls and is unsafe to care for self at home. Social work has found placement and I have completed discharge papers so that he can be transported to that facility.  Sharlett Iles, MD 02/18/16 1245

## 2016-02-18 NOTE — Clinical Social Work Note (Signed)
CSW spoke with pt's [name] who will transport pt to rehab today.  He reported that he would be at the hospital in about an hour for pt transport.  Pt is going into room 107 [facility].  CSW spoke with pt's daughter Hebert Soho to provide information regarding discharge to rehab.  Dede Query, LCSW [place] Worker - Weekend Coverage cell #: [phone]

## 2016-02-18 NOTE — ED Notes (Signed)
PT at bedside for evaluation.

## 2016-02-18 NOTE — Clinical Social Work Note (Signed)
[facility] has a bed today for pt.  He will be going into room 107.  All paperwork was sent though the [facility].  CSW met with pt to assess for transport to facility.  He will call his ex wife to see if she can transport.  CSW called both of pt's daughters to assess for help with transport.  Daughter Hebert Soho will call around to see stating she does not drive.  Message left for daughter [name] to call back. RN will be provided with information to obtain any needs prescriptions.  Dede Query, LCSW [place] Worker - Weekend Coverage cell #: [phone]

## 2016-02-29 DIAGNOSIS — M545 Low back pain: Secondary | ICD-10-CM | POA: Diagnosis not present

## 2016-02-29 DIAGNOSIS — R609 Edema, unspecified: Secondary | ICD-10-CM | POA: Diagnosis not present

## 2016-03-03 ENCOUNTER — Other Ambulatory Visit: Payer: Self-pay | Admitting: Internal Medicine

## 2016-03-14 DIAGNOSIS — Z9181 History of falling: Secondary | ICD-10-CM | POA: Diagnosis not present

## 2016-03-14 DIAGNOSIS — K219 Gastro-esophageal reflux disease without esophagitis: Secondary | ICD-10-CM | POA: Diagnosis not present

## 2016-03-14 DIAGNOSIS — R2681 Unsteadiness on feet: Secondary | ICD-10-CM | POA: Diagnosis not present

## 2016-03-14 DIAGNOSIS — C61 Malignant neoplasm of prostate: Secondary | ICD-10-CM | POA: Diagnosis not present

## 2016-03-14 DIAGNOSIS — G629 Polyneuropathy, unspecified: Secondary | ICD-10-CM | POA: Diagnosis not present

## 2016-03-14 DIAGNOSIS — M6281 Muscle weakness (generalized): Secondary | ICD-10-CM | POA: Diagnosis not present

## 2016-03-20 ENCOUNTER — Encounter (HOSPITAL_COMMUNITY): Payer: Self-pay | Admitting: *Deleted

## 2016-03-20 ENCOUNTER — Emergency Department (HOSPITAL_COMMUNITY): Payer: Medicare Other

## 2016-03-20 ENCOUNTER — Observation Stay (HOSPITAL_COMMUNITY)
Admission: EM | Admit: 2016-03-20 | Discharge: 2016-03-22 | Disposition: A | Discharge status: Skilled Nursing Facility | Payer: Medicare Other | Attending: Internal Medicine | Admitting: Internal Medicine

## 2016-03-20 DIAGNOSIS — M4806 Spinal stenosis, lumbar region: Secondary | ICD-10-CM | POA: Insufficient documentation

## 2016-03-20 DIAGNOSIS — R443 Hallucinations, unspecified: Secondary | ICD-10-CM | POA: Diagnosis present

## 2016-03-20 DIAGNOSIS — Z7409 Other reduced mobility: Secondary | ICD-10-CM | POA: Diagnosis present

## 2016-03-20 DIAGNOSIS — F29 Unspecified psychosis not due to a substance or known physiological condition: Secondary | ICD-10-CM | POA: Insufficient documentation

## 2016-03-20 DIAGNOSIS — R262 Difficulty in walking, not elsewhere classified: Secondary | ICD-10-CM | POA: Insufficient documentation

## 2016-03-20 DIAGNOSIS — M549 Dorsalgia, unspecified: Secondary | ICD-10-CM | POA: Diagnosis present

## 2016-03-20 DIAGNOSIS — W19XXXA Unspecified fall, initial encounter: Secondary | ICD-10-CM

## 2016-03-20 DIAGNOSIS — G629 Polyneuropathy, unspecified: Secondary | ICD-10-CM | POA: Diagnosis not present

## 2016-03-20 DIAGNOSIS — S59902A Unspecified injury of left elbow, initial encounter: Secondary | ICD-10-CM | POA: Diagnosis not present

## 2016-03-20 DIAGNOSIS — G8929 Other chronic pain: Secondary | ICD-10-CM | POA: Insufficient documentation

## 2016-03-20 DIAGNOSIS — I10 Essential (primary) hypertension: Secondary | ICD-10-CM

## 2016-03-20 DIAGNOSIS — R29898 Other symptoms and signs involving the musculoskeletal system: Secondary | ICD-10-CM | POA: Diagnosis not present

## 2016-03-20 DIAGNOSIS — S3992XA Unspecified injury of lower back, initial encounter: Secondary | ICD-10-CM | POA: Diagnosis not present

## 2016-03-20 DIAGNOSIS — T148 Other injury of unspecified body region: Secondary | ICD-10-CM | POA: Diagnosis not present

## 2016-03-20 DIAGNOSIS — R531 Weakness: Secondary | ICD-10-CM

## 2016-03-20 DIAGNOSIS — M5136 Other intervertebral disc degeneration, lumbar region: Secondary | ICD-10-CM | POA: Insufficient documentation

## 2016-03-20 DIAGNOSIS — Z87891 Personal history of nicotine dependence: Secondary | ICD-10-CM | POA: Diagnosis not present

## 2016-03-20 DIAGNOSIS — Z8546 Personal history of malignant neoplasm of prostate: Secondary | ICD-10-CM | POA: Diagnosis not present

## 2016-03-20 DIAGNOSIS — F0390 Unspecified dementia without behavioral disturbance: Secondary | ICD-10-CM | POA: Diagnosis not present

## 2016-03-20 DIAGNOSIS — R296 Repeated falls: Secondary | ICD-10-CM | POA: Diagnosis not present

## 2016-03-20 DIAGNOSIS — M5116 Intervertebral disc disorders with radiculopathy, lumbar region: Principal | ICD-10-CM | POA: Insufficient documentation

## 2016-03-20 DIAGNOSIS — R5381 Other malaise: Secondary | ICD-10-CM

## 2016-03-20 DIAGNOSIS — R627 Adult failure to thrive: Secondary | ICD-10-CM | POA: Diagnosis not present

## 2016-03-20 DIAGNOSIS — M545 Low back pain: Secondary | ICD-10-CM | POA: Diagnosis not present

## 2016-03-20 DIAGNOSIS — E860 Dehydration: Secondary | ICD-10-CM | POA: Diagnosis not present

## 2016-03-20 DIAGNOSIS — R6251 Failure to thrive (child): Secondary | ICD-10-CM | POA: Insufficient documentation

## 2016-03-20 LAB — COMPREHENSIVE METABOLIC PANEL
ALBUMIN: 3.9 g/dL (ref 3.5–5.0)
ALK PHOS: 55 U/L (ref 38–126)
ALT: 10 U/L — AB (ref 17–63)
ANION GAP: 13 (ref 5–15)
AST: 29 U/L (ref 15–41)
BUN: 21 mg/dL — AB (ref 6–20)
CALCIUM: 9.3 mg/dL (ref 8.9–10.3)
CO2: 26 mmol/L (ref 22–32)
CREATININE: 1.04 mg/dL (ref 0.61–1.24)
Chloride: 105 mmol/L (ref 101–111)
GFR calc Af Amer: >60 mL/min (ref >60)
GFR calc non Af Amer: >60 mL/min (ref >60)
GLUCOSE: 99 mg/dL (ref 65–99)
Potassium: 4.1 mmol/L (ref 3.5–5.1)
SODIUM: 144 mmol/L (ref 135–145)
Total Bilirubin: 1.2 mg/dL (ref 0.3–1.2)
Total Protein: 6.9 g/dL (ref 6.5–8.1)

## 2016-03-20 LAB — BASIC METABOLIC PANEL
BUN: 21 mg/dL (ref 4–21)
CREATININE: 1 mg/dL (ref 0.6–1.3)
GLUCOSE: 99 mg/dL
SODIUM: 144 mmol/L (ref 137–147)

## 2016-03-20 LAB — CBC WITH DIFFERENTIAL/PLATELET
BASOS PCT: 1 %
Basophils Absolute: 0 10*3/uL (ref 0.0–0.1)
Eosinophils Absolute: 0.1 10*3/uL (ref 0.0–0.7)
Eosinophils Relative: 2 %
HCT: 39.2 % (ref 39.0–52.0)
HEMOGLOBIN: 12 g/dL — AB (ref 13.0–17.0)
LYMPHS ABS: 1.1 10*3/uL (ref 0.7–4.0)
Lymphocytes Relative: 17 %
MCH: 25.7 pg — ABNORMAL LOW (ref 26.0–34.0)
MCHC: 30.6 g/dL (ref 30.0–36.0)
MCV: 83.9 fL (ref 78.0–100.0)
MONO ABS: 0.4 10*3/uL (ref 0.1–1.0)
MONOS PCT: 5 %
NEUTROS ABS: 5 10*3/uL (ref 1.7–7.7)
NEUTROS PCT: 75 %
Platelets: 290 10*3/uL (ref 150–400)
RBC: 4.67 MIL/uL (ref 4.22–5.81)
RDW: 14.9 % (ref 11.5–15.5)
WBC: 6.7 10*3/uL (ref 4.0–10.5)

## 2016-03-20 LAB — CBC
HCT: 36.1 % — ABNORMAL LOW (ref 39.0–52.0)
HEMOGLOBIN: 11.1 g/dL — AB (ref 13.0–17.0)
MCH: 25.8 pg — AB (ref 26.0–34.0)
MCHC: 30.7 g/dL (ref 30.0–36.0)
MCV: 83.8 fL (ref 78.0–100.0)
PLATELETS: 261 10*3/uL (ref 150–400)
RBC: 4.31 MIL/uL (ref 4.22–5.81)
RDW: 15 % (ref 11.5–15.5)
WBC: 5.8 10*3/uL (ref 4.0–10.5)

## 2016-03-20 LAB — URINALYSIS, ROUTINE W REFLEX MICROSCOPIC
Bilirubin Urine: NEGATIVE
GLUCOSE, UA: NEGATIVE mg/dL
Hgb urine dipstick: NEGATIVE
KETONES UR: 15 mg/dL — AB
LEUKOCYTES UA: NEGATIVE
Nitrite: NEGATIVE
PH: 6 (ref 5.0–8.0)
Protein, ur: NEGATIVE mg/dL
Specific Gravity, Urine: 1.023 (ref 1.005–1.030)

## 2016-03-20 LAB — CREATININE, SERUM: CREATININE: 0.99 mg/dL (ref 0.61–1.24)

## 2016-03-20 LAB — HEPATIC FUNCTION PANEL: Bilirubin, Total: 1.2 mg/dL

## 2016-03-20 LAB — CBC AND DIFFERENTIAL: WBC: 5.8 10*3/mL

## 2016-03-20 MED ORDER — PREDNISONE 50 MG PO TABS
50.0000 mg | ORAL_TABLET | Freq: Once | ORAL | Status: DC
  Filled 2016-03-20: qty 3

## 2016-03-20 MED ORDER — MORPHINE SULFATE (PF) 2 MG/ML IV SOLN: 1.0000 mg | INTRAVENOUS | Status: DC | PRN

## 2016-03-20 MED ORDER — HYDROCHLOROTHIAZIDE 10 MG/ML ORAL SUSPENSION
6.2500 mg | Freq: Two times a day (BID) | ORAL | Status: DC
  Administered 2016-03-21 – 2016-03-22 (×3): 6.25 mg via ORAL
  Filled 2016-03-20 (×4): qty 1.25

## 2016-03-20 MED ORDER — ENOXAPARIN SODIUM 40 MG/0.4ML ~~LOC~~ SOLN
40.0000 mg | SUBCUTANEOUS | Status: DC
  Administered 2016-03-21: 40 mg via SUBCUTANEOUS
  Filled 2016-03-20: qty 0.4

## 2016-03-20 MED ORDER — TRAZODONE HCL 50 MG PO TABS
50.0000 mg | ORAL_TABLET | Freq: Every day | ORAL | Status: DC
  Administered 2016-03-21: 50 mg via ORAL
  Filled 2016-03-20: qty 1

## 2016-03-20 MED ORDER — SODIUM CHLORIDE 0.9 % IV SOLN: INTRAVENOUS | Status: AC

## 2016-03-20 MED ORDER — FLEET ENEMA 7-19 GM/118ML RE ENEM: 1.0000 | ENEMA | Freq: Once | RECTAL | Status: DC | PRN

## 2016-03-20 MED ORDER — PANTOPRAZOLE SODIUM 40 MG PO TBEC
40.0000 mg | DELAYED_RELEASE_TABLET | Freq: Two times a day (BID) | ORAL | Status: DC
  Administered 2016-03-20 – 2016-03-22 (×4): 40 mg via ORAL
  Filled 2016-03-20 (×4): qty 1

## 2016-03-20 MED ORDER — DOCUSATE SODIUM 100 MG PO CAPS
100.0000 mg | ORAL_CAPSULE | Freq: Two times a day (BID) | ORAL | Status: DC
  Administered 2016-03-20 – 2016-03-22 (×4): 100 mg via ORAL
  Filled 2016-03-20 (×4): qty 1

## 2016-03-20 MED ORDER — BISACODYL 10 MG RE SUPP: 10.0000 mg | Freq: Every day | RECTAL | Status: DC | PRN

## 2016-03-20 MED ORDER — METHOCARBAMOL 500 MG PO TABS: 500.0000 mg | ORAL_TABLET | Freq: Three times a day (TID) | ORAL | Status: DC | PRN

## 2016-03-20 MED ORDER — FUROSEMIDE 20 MG PO TABS
40.0000 mg | ORAL_TABLET | Freq: Every day | ORAL | Status: DC
  Filled 2016-03-20: qty 2

## 2016-03-20 MED ORDER — ONDANSETRON HCL 4 MG/2ML IJ SOLN: 4.0000 mg | Freq: Four times a day (QID) | INTRAMUSCULAR | Status: DC | PRN

## 2016-03-20 MED ORDER — SODIUM CHLORIDE 0.9 % IV BOLUS (SEPSIS)
500.0000 mL | Freq: Once | INTRAVENOUS | Status: AC
  Administered 2016-03-20: 500 mL via INTRAVENOUS

## 2016-03-20 MED ORDER — HYDROCODONE-ACETAMINOPHEN 5-325 MG PO TABS
1.0000 | ORAL_TABLET | ORAL | Status: DC | PRN
  Administered 2016-03-20 – 2016-03-21 (×2): 1 via ORAL
  Filled 2016-03-20 (×2): qty 1

## 2016-03-20 MED ORDER — ACETAMINOPHEN 650 MG RE SUPP: 650.0000 mg | Freq: Four times a day (QID) | RECTAL | Status: DC | PRN

## 2016-03-20 MED ORDER — TRAZODONE HCL 50 MG PO TABS: 25.0000 mg | ORAL_TABLET | Freq: Every evening | ORAL | Status: DC | PRN

## 2016-03-20 MED ORDER — ACETAMINOPHEN 325 MG PO TABS: 650.0000 mg | ORAL_TABLET | Freq: Four times a day (QID) | ORAL | Status: DC | PRN

## 2016-03-20 MED ORDER — ONDANSETRON HCL 4 MG PO TABS
4.0000 mg | ORAL_TABLET | Freq: Four times a day (QID) | ORAL | Status: DC | PRN
  Filled 2016-03-20: qty 1

## 2016-03-20 NOTE — ED Notes (Signed)
Attempted report to 5W. 

## 2016-03-20 NOTE — Care Management Note (Signed)
Case Management Note  Patient Details  Name: Evan Charles MRN: [id number] Date of Birth: [birth year]  Subjective/Objective:                  Pt arrives from home via [facility]. Pt states he fell around 0100 this AM and was unable to stand back up. Initially pt refused transport and asked for assistance to get into his chair. EMS noted the pt was unable to stand by himself and was favoring his left side. EMS also states the pt house was in disarray and unsanitary. EMS also reports the pt had a dog wrapped around a pole on the front porch that was unable to move or get to food and water. Pt nephew states the pt was offered care from the [place] and refused. Pt was released from rehab recently and states he doesn't want to lose his house or his ability to live independently.  Action/Plan: Follow for disposition needs. // Resume HH vs SNF placement   Expected Discharge Date:  03/20/16               Expected Discharge Plan:  [place]  In-House Referral:  Clinical Social Work  Discharge planning Services  CM Consult  Post Acute Care Choice:    Choice offered to:     DME Arranged:    DME Agency:     HH Arranged:    HH Agency:   [facility]  Status of Service:  In process, will continue to follow  Medicare Important Message Given:    Date Medicare IM Given:    Medicare IM give by:    Date Additional Medicare IM Given:    Additional Medicare Important Message give by:     If discussed at [place] of Stay Meetings, dates discussed:    Additional Comments: Pt currently active with [facility] for RN services.  Resumption of care requested if SNF placement unsuccessful. Santiago Glad of [facility] [phone]) notified. Santiago Glad concerned about pt returning home as he is extremely weak; understands pt does not want to lose home/possesions but doesn't feel he is managing well on his own.  No DME needs identified at this time.  Fuller Mandril, RN 03/20/2016, 11:55 AM

## 2016-03-20 NOTE — Progress Notes (Signed)
CSW attempting to verify the number of UHC-Medicare SNF days pt has remaining in order to place him.  Pt has had recent SNF placements in 2/17 and 4/17, with no Medicaid in place.  CSW will continue to follow for disposition.

## 2016-03-20 NOTE — Progress Notes (Signed)
Patient trasfered from ED to [phone] via stretcher; alert and oriented x 3; complaints of pain in lower back (MD notified); IV saline locked in [place] - fluids were started (NS@75 /hr) per MD order; Orient patient to room and unit; gave patient care guide; instructed how to use the call bell and  fall risk precautions. Will continue to monitor the patient.

## 2016-03-20 NOTE — ED Notes (Signed)
Attempted report 

## 2016-03-20 NOTE — Progress Notes (Signed)
CSW attempted to engage with Patient at his bedside to assess for SNF placement. Patient was being evaluated by physical therapy. CSW will return upon completion of PT Evaluation.     Emiliano Dyer, LCSW [facility] ED/93M Clinical Social Worker [phone]

## 2016-03-20 NOTE — Progress Notes (Addendum)
CSW has contacted Adult Protective Services to inquire about if Patient has an Herbalist as it appears an APS report was made in April from [facility] ED staff. Voice message left requesting return phone call as soon as possible.    1528: CSW engaged with APS and informed them that EMS also states the pt house was in disarray and unsanitary. EMS also reports the pt had a dog wrapped around a pole on the front porch that was unable to move or get to food and water. Per APS, there is not enough information from EMS to make a report and notes that if there is concern about the dog, animal control should be contacted. APS encouraged CSW to call back if more information is obtained to justify APS report. She also reports that she is unable to provide CSW with information about the Patient, particularly, if he has a case worker assigned.    Emiliano Dyer, LCSW [facility] ED/61M Clinical Social Worker [phone]

## 2016-03-20 NOTE — NC FL2 (Signed)
[place] LEVEL OF CARE SCREENING TOOL     IDENTIFICATION  Patient Name: Evan Charles Birthdate: [birth year] Sex: male Admission Date (Current Location): 03/20/2016  [facility] and [place] Number:  Herbalist and Address:  [facility] [place], [place] [address] [place], [place] [address]      Provider Number: [id number]  Attending Physician Name and Address:  Elnora Morrison, MD  Relative Name and Phone Number:  Leonia Corona (Daughter) [phone]    Current Level of Care: Hospital Recommended Level of Care: [place] Prior Approval Number:    Date Approved/Denied:   PASRR Number: [id number]  Discharge Plan: SNF    Current Diagnoses: Patient Active Problem List   Diagnosis Date Noted  . 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 GERD without esophagitis 12/19/2015  . Bilateral lower extremity edema 12/19/2015  . Intractable low back pain 02/14/2015  . Difficulty walking 02/14/2015  . Prostate cancer ([place]) 02/14/2015  . H/O: upper GI bleed   . Chronic back pain   . GI bleed 02/13/2015  . Visual hallucinations   . Hallucinations 11/02/2014  . Psychoses     Orientation RESPIRATION BLADDER Height & Weight     Situation, Self, Time  Normal Incontinent Weight:   Height:     BEHAVIORAL SYMPTOMS/MOOD NEUROLOGICAL BOWEL NUTRITION STATUS      Continent Diet  AMBULATORY STATUS COMMUNICATION OF NEEDS Skin   Extensive Assist Verbally Normal                       Personal Care Assistance Level of Assistance  Bathing, Dressing Bathing Assistance: Limited assistance Feeding assistance: Independent Dressing Assistance: Limited assistance     Functional Limitations Info  Sight, Hearing, Speech Sight Info: Adequate Hearing Info: Adequate Speech Info: Adequate    SPECIAL CARE FACTORS FREQUENCY  PT (By licensed PT), OT (By licensed OT)     PT Frequency: min 3x/week                Contractures Contractures Info: Not present    Additional Factors Info  Code Status, Allergies, Psychotropic Code Status Info: FULL Allergies Info: No Known Allergies Psychotropic Info: Abilify; Zyprexa         Current Medications (03/20/2016):  This is the current hospital active medication list No current facility-administered medications for this encounter.   Current Outpatient Prescriptions  Medication Sig Dispense Refill  . ARIPiprazole (ABILIFY) 20 MG tablet Take 0.5 tablets (10 mg total) by mouth daily. 15 tablet 0  . cholecalciferol (VITAMIN D) 1000 units tablet Take 1 tablet (1,000 Units total) by mouth daily. 30 tablet 0  . docusate sodium (COLACE) 50 MG capsule Take 1 capsule (50 mg total) by mouth 3 (three) times daily as needed for mild constipation. 30 capsule 0  . furosemide (LASIX) 40 MG tablet Take 40 mg by mouth daily.    . naproxen (NAPROSYN) 500 MG tablet Take 500 mg by mouth 2 (two) times daily with a meal.    . OLANZapine (ZYPREXA) 5 MG tablet Take 1 tablet (5 mg total) by mouth at bedtime. 30 tablet 0  . pantoprazole (PROTONIX) 40 MG tablet Take 1 tablet (40 mg total) by mouth 2 (two) times daily. 60 tablet 0  . traMADol (ULTRAM) 50 MG tablet Take 100 mg by mouth every 8 (eight) hours as needed for moderate pain or severe pain.     . traZODone (DESYREL) 50 MG tablet  Take 1 tablet (50 mg total) by mouth at bedtime. 30 tablet 0  . urea (CARMOL) 20 % cream Apply 1 application topically daily. Apply moderate amount to affected area every day       Discharge Medications: Please see discharge summary for a list of discharge medications.  Relevant Imaging Results:  Relevant Lab Results:   Additional Information SS# [id number]  Judeth Horn, LCSW

## 2016-03-20 NOTE — ED Notes (Signed)
Pt arrives [facility] via [facility]. Pt states he fell around 0100 this AM and was unable to stand back up. Initially pt refused transport and asked for assistance to get into his chair. EMS noted the pt was unable to stand by himself and was favoring his left side. EMS also states the pt house was in disarray and unsanitary. EMS also reports the pt had a dog wrapped around a pole on the front porch that was unable to move or get to food and water. Pt nephew states the pt was offered care from the [place] and refused. Pt was released from rehab recently and states he doesn't want to lose his house or his ability to live independently.

## 2016-03-20 NOTE — Evaluation (Signed)
Physical Therapy Evaluation Patient Details Name: Evan Charles MRN: [id number] DOB: [birth year] Today's Date: 03/20/2016   History of Present Illness  Pt is a 70 y/o M who was found on the floor by his nephew at [facility].  Upon EMS arrival he was unable to stand by himself. Pt was recently d/c in April after a fall at home. Pt's PMH includes back pain, colitis, prostate cancer, neuropathy ("hands"), chronic mental illness, laminectomy.    Clinical Impression  Pt admitted with above diagnosis. Pt currently with functional limitations due to the deficits listed below (see PT Problem List). Pt presents w/ generalized weakness, impaired cognition, impaired balance, and s/p recurrent falls at home. Pt very confused and requires mod assist to take small side steps at bedside.  He lives alone and is a high fall risk, will need SNF at d/c to increase his independence and safety with mobility.      Follow Up Recommendations SNF;Supervision/Assistance - 24 hour    Equipment Recommendations  Other (comment) (TBD at next venue of care)    Recommendations for Other Services OT consult     Precautions / Restrictions Precautions Precautions: Fall Restrictions Weight Bearing Restrictions: No      Mobility  Bed Mobility Overal bed mobility: Needs Assistance Bed Mobility: Supine to Sit;Sit to Supine     Supine to sit: Mod assist Sit to supine: Min assist   General bed mobility comments: Use of bed pad for positioning and step by step verbal and tactile cues for sequencing.  Pt unable to follow command to scoot to the [facility] and rolls to his side instead.    Transfers Overall transfer level: Needs assistance Equipment used: 1 person hand held assist Transfers: Sit to/from Stand Sit to Stand: Mod assist         General transfer comment: Cues and assist to boost to standing and to steady due to posterior tendency  Ambulation/Gait Ambulation/Gait assistance: Mod assist Ambulation Distance  (Feet): 3 Feet Assistive device: 1 person hand held assist Gait Pattern/deviations: Shuffle   Gait velocity interpretation: <1.8 ft/sec, indicative of risk for recurrent falls General Gait Details: Pt shuffles his feet at bedside and is unable to pick feet up off floor when cues provided.  Knees remain in hyperextension due to lack of engagement of quads.  Mod assist due to posterior tendency.  Stairs            Wheelchair Mobility    Modified Rankin (Stroke Patients Only)       Balance Overall balance assessment: Needs assistance;History of Falls Sitting-balance support: Bilateral upper extremity supported;Feet supported Sitting balance-Leahy Scale: Fair Sitting balance - Comments: Bil UEs supported while sitting EOB w/ close min guard assist Postural control: Posterior lean Standing balance support: Single extremity supported;During functional activity Standing balance-Leahy Scale: Poor                               Pertinent Vitals/Pain Pain Assessment: Faces Faces Pain Scale: Hurts little more Pain Location: Lt elbow Pain Descriptors / Indicators: Grimacing (denies pain until end of session when he pushes elbow in bed) Pain Intervention(s): Limited activity within patient's tolerance;Monitored during session    Home Living Family/patient expects to be discharged to:: Skilled nursing facility Living Arrangements: Alone Available Help at Discharge: Family;Available PRN/intermittently Type of Home: House Home Access: Stairs to enter Entrance Stairs-Rails: Chemical engineer of Steps: 4 Home Layout: One level Home Equipment: Environmental consultant -  4 wheels ("I had a cane until it was stolen") Additional Comments: Pt is a poor historian, unable to determine reliability of information provided.    Prior Function Level of Independence: Needs assistance   Gait / Transfers Assistance Needed: Ambulating w/ RW.  Reports 5 falls in the past month  ADL's /  Homemaking Assistance Needed: Needs assist from nephew to don/doff shoes and socks and to put on pants.  Says, "I take my time and shimmy them on" when asked what he does when his family isn't there.        Hand Dominance        Extremity/Trunk Assessment   Upper Extremity Assessment: Defer to OT evaluation           Lower Extremity Assessment: Generalized weakness;RLE deficits/detail;LLE deficits/detail RLE Deficits / Details: strength grossly 3-/5; Bil LE edema LLE Deficits / Details: strength grossly 3-/5; Bil LE edema     Communication   Communication: No difficulties  Cognition Arousal/Alertness: Awake/alert Behavior During Therapy: Flat affect;Anxious Overall Cognitive Status: No family/caregiver present to determine baseline cognitive functioning Area of Impairment: Orientation;Safety/judgement;Following commands;Memory;Attention;Awareness;Problem solving Orientation Level: Disoriented to;Place;Time;Situation Current Attention Level: Focused Memory: Decreased short-term memory Following Commands: Follows one step commands inconsistently Safety/Judgement: Decreased awareness of safety;Decreased awareness of deficits Awareness: Intellectual Problem Solving: Slow processing;Decreased initiation;Difficulty sequencing;Requires verbal cues;Requires tactile cues General Comments: Alternates between thinking he is at home and knowing he is in the hospital.  Says it is May 1st.  Perseverates on walking around the bed even after resting comfortably in bed at end of session.  Repeats phrases like "We need to get around it" while hitting the rail w/ his hand.    General Comments      Exercises        Assessment/Plan    PT Assessment Patient needs continued PT services  PT Diagnosis Difficulty walking;Generalized weakness   PT Problem List Decreased strength;Decreased activity tolerance;Decreased balance;Decreased mobility;Decreased coordination;Decreased  cognition;Decreased knowledge of use of DME;Decreased safety awareness;Pain  PT Treatment Interventions DME instruction;Stair training;Gait training;Functional mobility training;Therapeutic activities;Therapeutic exercise;Balance training;Cognitive remediation;Patient/family education   PT Goals (Current goals can be found in the Care Plan section) Acute Rehab PT Goals Patient Stated Goal: none stated PT Goal Formulation: Patient unable to participate in goal setting Time For Goal Achievement: 04/03/16 Potential to Achieve Goals: Fair    Frequency Min 2X/week   Barriers to discharge Decreased caregiver support;Inaccessible home environment steps to enter home and lives alone    Co-evaluation               End of Session Equipment Utilized During Treatment: Gait belt Activity Tolerance: Patient limited by fatigue;Other (comment) (limited by impaired cognition) Patient left: in bed;with call bell/phone within reach Nurse Communication: Mobility status    Functional Assessment Tool Used: Clinical Judgement Functional Limitation: Mobility: Walking and moving around Mobility: Walking and Moving Around Current Status [id number]): At least 40 percent but less than 60 percent impaired, limited or restricted Mobility: Walking and Moving Around Goal Status [phone]): At least 20 percent but less than 40 percent impaired, limited or restricted    Time: 1346-1407 PT Time Calculation (min) (ACUTE ONLY): 21 min   Charges:   PT Evaluation $PT Eval Moderate Complexity: 1 Procedure     PT G Codes:   PT G-Codes **NOT FOR INPATIENT CLASS** Functional Assessment Tool Used: Clinical Judgement Functional Limitation: Mobility: Walking and moving around Mobility: Walking and Moving Around Current Status [id number]): At least 40 percent but  less than 60 percent impaired, limited or restricted Mobility: Walking and Moving Around Goal Status [phone]): At least 20 percent but less than 40 percent impaired,  limited or restricted   Collie Siad PT, DPT  Pager: [phone] Phone: [phone] 03/20/2016, 3:30 PM

## 2016-03-20 NOTE — Progress Notes (Signed)
CSW engaged with Juliann Pulse at [employer] who reports Patient was discharged from [facility] on last week 03/13/2016. Per Juliann Pulse, they do not feel he has a skillable need for SNF and are unable to accept him back. CSW called and updated EDP. CSW has faxed referrals to local and surrounding area SNFs. CSW will continue to follow for disposition.      Emiliano Dyer, LCSW [facility] ED/26M Clinical Social Worker [phone]

## 2016-03-20 NOTE — ED Notes (Signed)
Patient was unable to stand on his own. Was able to stand with two person assist.

## 2016-03-20 NOTE — ED Provider Notes (Signed)
CSN: [id number]     Arrival date & time 03/20/16  1106 History   First MD Initiated Contact with Patient 03/20/16 1111     Chief Complaint  Patient presents with  . Fall     (Consider location/radiation/quality/duration/timing/severity/associated sxs/prior Treatment) HPI Comments: 70 year old male with history of failure to thrive, weakness, difficulty walking, GI bleed presents with EMS after he fell at 1:00 this morning and unable to get up until EMS arrived. Patient unable to stand by himself. Patient's house was in disarray in very unsteady and anicteric per report. Patient lives alone with a dog was found wrapped around a pole on the front porch and unable to move or get to food or water. Patient has been focused on maintaining his independence and recently left rehabilitation facility. Patient denies head injuries. Patient recalls events. Patient does not live with any family.  Patient is a 70 y.o. male presenting with fall. The history is provided by the patient and the EMS personnel.  Fall Pertinent negatives include no chest pain, no abdominal pain, no headaches and no shortness of breath.    Past Medical History  Diagnosis Date  . Back pain   . Colitis   . Prostate cancer ([place])   . GERD (gastroesophageal reflux disease)   . Neuropathy ([place])     " MY HANDS "  . Hypertension   . Chronic mental illness    Past Surgical History  Procedure Laterality Date  . Prostatectomy    . Laminectomy    . Knee arthroscopy     Family History  Problem Relation Age of Onset  . Cancer Brother   . Cancer Maternal Grandmother    Social History  Substance Use Topics  . Smoking status: Former Smoker    Types: Cigarettes  . Smokeless tobacco: Never Used  . Alcohol Use: No    Review of Systems  Constitutional: Positive for fatigue. Negative for fever and chills.  HENT: Negative for congestion.   Eyes: Negative for visual disturbance.  Respiratory: Negative for shortness of breath.    Cardiovascular: Negative for chest pain.  Gastrointestinal: Negative for vomiting and abdominal pain.  Genitourinary: Negative for dysuria and flank pain.  Musculoskeletal: Positive for arthralgias and gait problem. Negative for back pain, neck pain and neck stiffness.  Skin: Negative for rash.  Neurological: Positive for weakness (general). Negative for light-headedness and headaches.      Allergies  Review of patient's allergies indicates no known allergies.  Home Medications   Prior to Admission medications   Medication Sig Start Date End Date Taking? Authorizing Provider  cholecalciferol (VITAMIN D) 1000 units tablet Take 1 tablet (1,000 Units total) by mouth daily. 12/16/15  Yes Leo Grosser, MD  docusate sodium (COLACE) 50 MG capsule Take 1 capsule (50 mg total) by mouth 3 (three) times daily as needed for mild constipation. 12/16/15  Yes Leo Grosser, MD  furosemide (LASIX) 40 MG tablet Take 40 mg by mouth daily.   Yes Historical Provider, MD  naproxen (NAPROSYN) 500 MG tablet Take 500 mg by mouth 2 (two) times daily with a meal.   Yes Historical Provider, MD  pantoprazole (PROTONIX) 40 MG tablet Take 1 tablet (40 mg total) by mouth 2 (two) times daily. 12/16/15  Yes Leo Grosser, MD  traMADol (ULTRAM) 50 MG tablet Take 100 mg by mouth every 8 (eight) hours as needed. Moderate/severe pain 03/13/16  Yes Historical Provider, MD  traZODone (DESYREL) 50 MG tablet Take 1 tablet (50 mg total) by  mouth at bedtime. 12/16/15  Yes Leo Grosser, MD  ARIPiprazole (ABILIFY) 20 MG tablet Take 0.5 tablets (10 mg total) by mouth daily. Patient not taking: Reported on 03/20/2016 12/16/15   Leo Grosser, MD  OLANZapine (ZYPREXA) 5 MG tablet Take 1 tablet (5 mg total) by mouth at bedtime. Patient not taking: Reported on 03/20/2016 12/16/15   Leo Grosser, MD   BP 102/76 mmHg  Pulse 76  Temp(Src) 98 F (36.7 C) (Oral)  Resp 13  SpO2 100% Physical Exam  Constitutional: He is oriented to person, place, and  time. He appears well-developed.  HENT:  Head: Normocephalic and atraumatic.  Dry mucous membranes  Eyes: Right eye exhibits no discharge. Left eye exhibits no discharge.  Neck: Normal range of motion. Neck supple. No tracheal deviation present.  Cardiovascular: Normal rate and regular rhythm.   Pulmonary/Chest: Effort normal and breath sounds normal.  Abdominal: Soft. He exhibits no distension. There is no tenderness. There is no guarding.  Musculoskeletal: He exhibits no edema.  Neurological: He is alert and oriented to person, place, and time. GCS eye subscore is 4. GCS verbal subscore is 5. GCS motor subscore is 6.  General weakness on exam. Patient unable to ambulate by himself. Pupils equal bilateral horizontal eye movements intact. No facial droop.  Skin: Skin is warm. No rash noted.  Psychiatric: He has a normal mood and affect.  Nursing note and vitals reviewed.   ED Course  Procedures (including critical care time) Labs Review Labs Reviewed  CBC WITH DIFFERENTIAL/PLATELET - Abnormal; Notable for the following:    Hemoglobin 12.0 (*)    MCH 25.7 (*)    All other components within normal limits  COMPREHENSIVE METABOLIC PANEL - Abnormal; Notable for the following:    BUN 21 (*)    ALT 10 (*)    All other components within normal limits  URINALYSIS, ROUTINE W REFLEX MICROSCOPIC (NOT AT [facility]) - Abnormal; Notable for the following:    Ketones, ur 15 (*)    All other components within normal limits    Imaging Review Dg Lumbar Spine Complete  03/20/2016  CLINICAL DATA:  Low back pain post fall, has had low back pain for 30 years EXAM: LUMBAR SPINE - COMPLETE 4+ VIEW COMPARISON:  MRI lumbar spine 02/14/2015 FINDINGS: Five non-rib-bearing lumbar vertebra. Bones demineralized. Multilevel disc space narrowing throughout lumbar spine. Scattered vacuum phenomenon. Irregularity at superior endplate L5 unchanged. Facet degenerative changes at lower lumbar spine. Mild retrolisthesis L3-L4  unchanged. Vertebral body heights maintained without fracture or additional subluxation. No bone destruction or spondylolysis. SI joints preserved. 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 No acute abnormalities. Electronically Signed   By: Lavonia Dana M.D.   On: 03/20/2016 12:25   Dg Pelvis 1-2 Views  03/20/2016  CLINICAL DATA:  Back pain post fall, has had low back pain for 30 years EXAM: PELVIS - 1-2 VIEW COMPARISON:  None FINDINGS: Hip joints preserved and symmetric. Slight irregularity of the LEFT SI joint versus RIGHT, though patient is minimally rotated; subtle asymmetric LEFT sacroiliitis not excluded. Degenerative disc disease changes at visualized lower lumbar spine. No fracture, dislocation or bone destruction. IMPRESSION: No acute bony injuries identified. Degenerative disc disease changes lower lumbar spine. Cannot exclude subtle LEFT sacroiliitis. Electronically Signed   By: Lavonia Dana M.D.   On: 03/20/2016 12:26   Ct Head Wo Contrast  03/20/2016  CLINICAL DATA:  Multiple falls, no loss consciousness EXAM: CT HEAD  WITHOUT CONTRAST TECHNIQUE: Contiguous axial images were obtained from the base of the skull through the vertex without intravenous contrast. COMPARISON:  02/11/2016 FINDINGS: There is no evidence of mass effect, midline shift, or extra-axial fluid collections. There is no evidence of a space-occupying lesion or intracranial hemorrhage. There is no evidence of a cortical-based area of acute infarction. There is generalized cerebral atrophy. There is periventricular white matter low attenuation likely secondary to microangiopathy. The ventricles and sulci are appropriate for the patient's age. The basal cisterns are patent. Visualized portions of the orbits are unremarkable. The visualized portions of the paranasal sinuses and mastoid air cells are unremarkable. Cerebrovascular atherosclerotic calcifications are noted. The osseous  structures are unremarkable. IMPRESSION: 1. No acute intracranial pathology. 2. Chronic microvascular disease and cerebral atrophy. Electronically Signed   By: Kathreen Devoid   On: 03/20/2016 13:25   I have personally reviewed and evaluated these images and lab results as part of my medical decision-making.   EKG Interpretation None      MDM   Final diagnoses:  Fall  Immobility Failure to thrive  Patient presents with clinically failure to thrive and unable to cannulate by himself. Patient had screening blood work, x-rays and CT head with no acute findings. Physical therapy consult ordered. Discussed with case management. Complex situation and patient unable to go home due to current situation. Patient admits that he needs further assistance. Discussed with [facility] hospitalist for admission.  The patients results and plan were reviewed and discussed.   Any x-rays performed were independently reviewed by myself.   Differential diagnosis were considered with the presenting HPI.  Medications  sodium chloride 0.9 % bolus 500 mL (0 mLs Intravenous Stopped 03/20/16 1253)    Filed Vitals:   03/20/16 1116 03/20/16 1330 03/20/16 1345  BP: 108/79 133/97 102/76  Pulse: 85 83 76  Temp: 98 F (36.7 C)    TempSrc: Oral    Resp: 16 14 13   SpO2: 99% 100% 100%    Final diagnoses:  Fall    Admission/ observation were discussed with the admitting physician, patient and/or family and they are comfortable with the plan.     Elnora Morrison, MD 03/20/16 [phone]

## 2016-03-20 NOTE — H&P (Signed)
[facility] History and Physical  CROCKETT RIDDER [id number] DOB: 03-07-1946 DOA: 03/20/2016  Referring physician: [email or web address] PCP: No primary care provider on file.   Chief Complaint: intractable back pain/immobility  HPI: Evan Charles is a pleasant 70 y.o. male the past medical history that includes mild dementia, hypertension, chronic low back pain, psychosis. Patient presents to  emergency department from home via EMS with a chief complaint of intractable back pain inability to walk. Initial evaluation yields patient with continued inability to bear weight.   Information is obtained from the patient and the chart. Chart review indicates 2 months ago patient left nursing facility [facility] but was evaluated and deemed competent. Family and providers concerned about his ability at that time to care for himself. Discharged with home health home PT and OT and social work. He reports he fell in the middle of the night. His nephew who checks on him daily found him on the floor unable to get up. He states over the last several days his chronic lower back pain has worsened and he is "unable to even stand up straight". He denies numbness or tingling of his lower extremities. He denies incontinence of bladder or bowel. Denies any abdominal pain nausea vomiting dysuria hematuria frequency or urgency. He denies chest pain palpitations cough fever chills recent sick contacts. Reports his neck he called 911. Chart review indicates initially he refused transport and ask for assistance to get to his chair. EMS noted he was unable to stand. EMS also stated the house was unsanitary and that the patient had a dog tethered to a pole the from porch in such a way the dog was unable to move or get food or water.  Emergency department patient afebrile hemodynamically stable and not hypoxic  Review of Systems:  10 point review of systems complete and all systems are negative except as indicated in  the history of present illness   Past Medical History  Diagnosis Date  . Back pain   . Colitis   . Prostate cancer ([place])   . GERD (gastroesophageal reflux disease)   . Neuropathy ([place])     " MY HANDS "  . Hypertension   . Chronic mental illness    Past Surgical History  Procedure Laterality Date  . Prostatectomy    . Laminectomy    . Knee arthroscopy     Social History:  reports that he has quit smoking. His smoking use included Cigarettes. He has never used smokeless tobacco. He reports that he does not drink alcohol or use illicit drugs. She lives at home alone he uses a walker with ambulation.  No Known Allergies  Family History  Problem Relation Age of Onset  . Cancer Brother   . Cancer Maternal Grandmother      Prior to Admission medications   Medication Sig Start Date End Date Taking? Authorizing Provider  cholecalciferol (VITAMIN D) 1000 units tablet Take 1 tablet (1,000 Units total) by mouth daily. 12/16/15  Yes Leo Grosser, MD  docusate sodium (COLACE) 50 MG capsule Take 1 capsule (50 mg total) by mouth 3 (three) times daily as needed for mild constipation. 12/16/15  Yes Leo Grosser, MD  furosemide (LASIX) 40 MG tablet Take 40 mg by mouth daily.   Yes Historical Provider, MD  naproxen (NAPROSYN) 500 MG tablet Take 500 mg by mouth 2 (two) times daily with a meal.   Yes Historical Provider, MD  pantoprazole (PROTONIX) 40 MG tablet Take 1 tablet (40  mg total) by mouth 2 (two) times daily. 12/16/15  Yes Leo Grosser, MD  traMADol (ULTRAM) 50 MG tablet Take 100 mg by mouth every 8 (eight) hours as needed. Moderate/severe pain 03/13/16  Yes Historical Provider, MD  traZODone (DESYREL) 50 MG tablet Take 1 tablet (50 mg total) by mouth at bedtime. 12/16/15  Yes Leo Grosser, MD  ARIPiprazole (ABILIFY) 20 MG tablet Take 0.5 tablets (10 mg total) by mouth daily. Patient not taking: Reported on 03/20/2016 12/16/15   Leo Grosser, MD  OLANZapine (ZYPREXA) 5 MG tablet Take 1 tablet (5 mg  total) by mouth at bedtime. Patient not taking: Reported on 03/20/2016 12/16/15   Leo Grosser, MD   Physical Exam: Filed Vitals:   03/20/16 1445 03/20/16 1515 03/20/16 1600 03/20/16 1615  BP: 106/94 100/88 112/95 132/87  Pulse: 77 78 92 97  Temp:      TempSrc:      Resp: 14 18 13 16   SpO2: 100% 98% 96% 100%    Wt Readings from Last 3 Encounters:  01/23/16 84.369 kg (186 lb)  01/11/16 84.369 kg (186 lb)  12/21/15 83.462 kg (184 lb)    General:  Appears calm and comfortable Well-nourished but obviously unkept Eyes: PERRL, normal lids, irises & conjunctiva ENT: grossly normal hearing, lips & tongue is membranes of his mouth are pink moist Neck: no LAD, masses or thyromegaly Cardiovascular: RRR, no m/r/g. Trace bilateral lower extremity edema  Respiratory: CTA bilaterally, no w/r/r. Normal respiratory effort. Abdomen: soft, ntnd  + bowel sounds throughout no guarding or rebounding Skin: no rash or induration seen on limited exam Musculoskeletal: grossly normal tone BUE/BLE, is without swelling/erythema mild tenderness low back Psychiatric: grossly normal mood and affect, speech fluent and appropriate Neurologic: grossly non-focal. speech clear facial symmetry oriented to self and place. Bilateral lower extremity strength 5 out of 5. Sensation intact bilaterally           Labs on Admission:  Basic Metabolic Panel:  Recent Labs Lab 03/20/16 1134 03/20/16 1541  NA 144  --   K 4.1  --   CL 105  --   CO2 26  --   GLUCOSE 99  --   BUN 21*  --   CREATININE 1.04 0.99  CALCIUM 9.3  --    Liver Function Tests:  Recent Labs Lab 03/20/16 1134  AST 29  ALT 10*  ALKPHOS 55  BILITOT 1.2  PROT 6.9  ALBUMIN 3.9   No results for input(s): LIPASE, AMYLASE in the last 168 hours. No results for input(s): AMMONIA in the last 168 hours. CBC:  Recent Labs Lab 03/20/16 1134 03/20/16 1541  WBC 6.7 5.8  NEUTROABS 5.0  --   HGB 12.0* 11.1*  HCT 39.2 36.1*  MCV 83.9 83.8  PLT  290 261   Cardiac Enzymes: No results for input(s): CKTOTAL, CKMB, CKMBINDEX, TROPONINI in the last 168 hours.  BNP (last 3 results)  Recent Labs  12/11/15 1424  BNP 15.4    ProBNP (last 3 results) No results for input(s): PROBNP in the last 8760 hours.  CBG: No results for input(s): GLUCAP in the last 168 hours.  Radiological Exams on Admission: Dg Lumbar Spine Complete  03/20/2016  CLINICAL DATA:  Low back pain post fall, has had low back pain for 30 years EXAM: LUMBAR SPINE - COMPLETE 4+ VIEW COMPARISON:  MRI lumbar spine 02/14/2015 FINDINGS: Five non-rib-bearing lumbar vertebra. Bones demineralized. Multilevel disc space narrowing throughout lumbar spine. Scattered vacuum phenomenon. Irregularity at  superior endplate L5 unchanged. Facet degenerative changes at lower lumbar spine. Mild retrolisthesis L3-L4 unchanged. Vertebral body heights maintained without fracture or additional subluxation. No bone destruction or spondylolysis. SI joints preserved. 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 No acute abnormalities. Electronically Signed   By: Lavonia Dana M.D.   On: 03/20/2016 12:25   Dg Pelvis 1-2 Views  03/20/2016  CLINICAL DATA:  Back pain post fall, has had low back pain for 30 years EXAM: PELVIS - 1-2 VIEW COMPARISON:  None FINDINGS: Hip joints preserved and symmetric. Slight irregularity of the LEFT SI joint versus RIGHT, though patient is minimally rotated; subtle asymmetric LEFT sacroiliitis not excluded. Degenerative disc disease changes at visualized lower lumbar spine. No fracture, dislocation or bone destruction. IMPRESSION: No acute bony injuries identified. Degenerative disc disease changes lower lumbar spine. Cannot exclude subtle LEFT sacroiliitis. Electronically Signed   By: Lavonia Dana M.D.   On: 03/20/2016 12:26   Ct Head Wo Contrast  03/20/2016  CLINICAL DATA:  Multiple falls, no loss consciousness EXAM: CT HEAD  WITHOUT CONTRAST TECHNIQUE: Contiguous axial images were obtained from the base of the skull through the vertex without intravenous contrast. COMPARISON:  02/11/2016 FINDINGS: There is no evidence of mass effect, midline shift, or extra-axial fluid collections. There is no evidence of a space-occupying lesion or intracranial hemorrhage. There is no evidence of a cortical-based area of acute infarction. There is generalized cerebral atrophy. There is periventricular white matter low attenuation likely secondary to microangiopathy. The ventricles and sulci are appropriate for the patient's age. The basal cisterns are patent. Visualized portions of the orbits are unremarkable. The visualized portions of the paranasal sinuses and mastoid air cells are unremarkable. Cerebrovascular atherosclerotic calcifications are noted. The osseous structures are unremarkable. IMPRESSION: 1. No acute intracranial pathology. 2. Chronic microvascular disease and cerebral atrophy. Electronically Signed   By: Kathreen Devoid   On: 03/20/2016 13:25    EKG:   Assessment/Plan Principal Problem:   Intractable back pain Active Problems:   Hallucinations   Psychoses   Difficulty walking   Hypertension   Immobility   Generalized weakness   Failure to thrive in adult  #1. Intractable back pain. Patient with long history of chronic back pain. X-ray shows no acute bony injuries but degenerative disc disease and lumbar spine. -Admit to MedSurg -Analgesia -Robaxin -Prednisone -physical therapy -Social Financial controller  2. Hypertension. Controlled in the emergency department. Home medications include Lasix.  -Monitor closely -will change lasix to prn -provide low dose HCTZ  #3. Generalized weakness/failure to thrive. Patient with history difficult social issues. In [place] about patient's ability to care for himself and his pets in his house. He was most recently admitted to a SNIF. At that time he left AMA. Chart review  indicates that family and providers all concerned about his ability to care for himself however he was evaluated and deemed competent -Physical therapy -Social work -Patient continues to the placement  #4. Hallucinations/psychosis. History of same. Appears to be stable at baseline. He is quite cooperative mission -Continue home meds     Code Status: full DVT Prophylaxis: Family Communication: none  Disposition Plan: needs placement but has refused in past.   Time spent: 74 minutes  [facility]

## 2016-03-21 ENCOUNTER — Observation Stay (HOSPITAL_COMMUNITY): Payer: Medicare Other

## 2016-03-21 DIAGNOSIS — R531 Weakness: Secondary | ICD-10-CM

## 2016-03-21 DIAGNOSIS — R627 Adult failure to thrive: Secondary | ICD-10-CM | POA: Diagnosis not present

## 2016-03-21 DIAGNOSIS — M549 Dorsalgia, unspecified: Secondary | ICD-10-CM | POA: Diagnosis not present

## 2016-03-21 DIAGNOSIS — M4806 Spinal stenosis, lumbar region: Secondary | ICD-10-CM | POA: Diagnosis not present

## 2016-03-21 DIAGNOSIS — R262 Difficulty in walking, not elsewhere classified: Secondary | ICD-10-CM | POA: Diagnosis not present

## 2016-03-21 LAB — GLUCOSE, CAPILLARY
GLUCOSE-CAPILLARY: 114 mg/dL — AB (ref 65–99)
GLUCOSE-CAPILLARY: 108 mg/dL — AB (ref 65–99)
GLUCOSE-CAPILLARY: 143 mg/dL — AB (ref 65–99)
Glucose-Capillary: 141 mg/dL — ABNORMAL HIGH (ref 65–99)

## 2016-03-21 MED ORDER — METHOCARBAMOL 500 MG PO TABS
500.0000 mg | ORAL_TABLET | Freq: Three times a day (TID) | ORAL | Status: DC
  Administered 2016-03-21 – 2016-03-22 (×4): 500 mg via ORAL
  Filled 2016-03-21 (×4): qty 1

## 2016-03-21 MED ORDER — TRAMADOL HCL 50 MG PO TABS: 50.0000 mg | ORAL_TABLET | Freq: Four times a day (QID) | ORAL | Status: DC | PRN

## 2016-03-21 MED ORDER — PREDNISONE 20 MG PO TABS
40.0000 mg | ORAL_TABLET | Freq: Every day | ORAL | Status: DC
  Administered 2016-03-21 – 2016-03-22 (×2): 40 mg via ORAL
  Filled 2016-03-21: qty 2

## 2016-03-21 MED ORDER — MELOXICAM 7.5 MG PO TABS
7.5000 mg | ORAL_TABLET | Freq: Every day | ORAL | Status: DC
  Administered 2016-03-21 – 2016-03-22 (×2): 7.5 mg via ORAL
  Filled 2016-03-21 (×2): qty 1

## 2016-03-21 NOTE — Care Management Note (Signed)
Case Management Note  Patient Details  Name: Evan Charles MRN: [id number] Date of Birth: [birth year]  Subjective/Objective:                 Patient in obs for back pain. Plan per patient is to go to SNF.    Action/Plan:  Will DC to SNF as facilitated through [place] [facility] [place], has [employer].  Expected Discharge Date:  03/20/16               Expected Discharge Plan:  Skilled Nursing Facility  In-House Referral:  Clinical Social Work  Discharge planning Services  CM Consult  Post Acute Care Choice:  Resumption of Svcs/PTA Provider Choice offered to:     DME Arranged:    DME Agency:     HH Arranged:    [place] Agency:   (if home would want [place])  Status of Service:  Completed, signed off  Medicare Important Message Given:    Date Medicare IM Given:    Medicare IM give by:    Date Additional Medicare IM Given:    Additional Medicare Important Message give by:     If discussed at [place] of Stay Meetings, dates discussed:    Additional Comments:  Carles Collet, RN 03/21/2016, 3:37 PM

## 2016-03-21 NOTE — Progress Notes (Signed)
[facility]                                                                              Patient Demographics  Evan Charles, is a 70 y.o. male, DOB - Apr 04, 1946, [id number]  Admit date - 03/20/2016   Admitting Physician Waldemar Dickens, MD  Outpatient Primary MD for the patient is No primary care provider on file.  Outpatient specialists:   LOS -   days    Chief Complaint  Patient presents with  . Fall       Brief summary  Patient is 71 year old male with mild dementia, hypertension, chronic low back pain presented with intractable back pain, inability to walk or bear weight. Patient's family also concerned about his ability to care for himself. Patient was found by his nephew on the floor unable to get up and over the last several days, chronic lower back pain has worsened and radiating to the legs. Patient was admitted for further workup.  Assessment & Plan   Intractable Low back pain with radiculopathy. Patient with long history of chronic back pain. X-ray shows no acute bony injuries but degenerative disc disease and lumbar spine. - Obtain MRI of the lumbar spine - Continue prednisone, placed on tramadol, Mobic, prednisone, Robaxin - PTOT evaluation, will likely need skilled nursing facility   Hypertension.  -Currently stable, continue HCTZ  Generalized weakness/failure to thrive.  - Lives alone, appears to be unable to take care of himself and his pets in the house.  - PTOT evaluation, discussed with social work, may need placement   Hallucinations/psychosis. History of same. Appears to be stable at baseline, currently cooperative.  -Continue home meds  Code Status: Full CODE STATUS  DVT Prophylaxis:  Lovenox    Family Communication: Discussed in detail with the patient, all imaging results, lab results explained to the patient    Disposition Plan:   Time Spent in minutes   64mins   Procedures:  X-ray lumbar spine  Consultants:     None  Antimicrobials:   None   Medications  Scheduled Meds: . docusate sodium  100 mg Oral BID  . enoxaparin (LOVENOX) injection  40 mg Subcutaneous Q24H  . hydrochlorothiazide  6.25 mg Oral BID  . meloxicam  7.5 mg Oral Daily  . methocarbamol  500 mg Oral TID  . pantoprazole  40 mg Oral BID  . predniSONE  40 mg Oral Q breakfast  . predniSONE  50 mg Oral Once  . traZODone  50 mg Oral QHS   Continuous Infusions:  PRN Meds:.acetaminophen **OR** acetaminophen, bisacodyl, HYDROcodone-acetaminophen, morphine injection, ondansetron **OR** ondansetron (ZOFRAN) IV, sodium phosphate, traMADol   Antibiotics   Anti-infectives    None        Subjective:   Evan Charles was seen and examined today. Continues to complain of lower back pain radiating down the legs. No significant weakness however patient has difficulty of walking or bearing weight. No urinary incontinence or retention. Patient denies chest pain, shortness of breath, abdominal pain, N/V/D/C. No acute events overnight.    Objective:   Filed Vitals:   03/20/16 1907 03/20/16 2214 03/21/16 [id number]  03/21/16 1140  BP: 99/70 104/70 120/74   Pulse: 88 87 70   Temp: 97.9 F (36.6 C) 97.7 F (36.5 C) 97.9 F (36.6 C)   TempSrc: Oral     Resp: 12 18 18    Height:    5\' 9"  (1.753 m)  Weight:    89.54 kg (197 lb 6.4 oz)  SpO2: 100% 98% 99%     Intake/Output Summary (Last 24 hours) at 03/21/16 1153 Last data filed at 03/21/16 1000  Gross per 24 hour  Intake    120 ml  Output    200 ml  Net    -80 ml     Wt Readings from Last 3 Encounters:  03/21/16 89.54 kg (197 lb 6.4 oz)  01/23/16 84.369 kg (186 lb)  01/11/16 84.369 kg (186 lb)     Exam  General: Alert and oriented x 3, NAD  HEENT:  PERRLA, EOMI, Anicteric Sclera, mucous membranes moist.   Neck: Supple, no JVD, no masses  Cardiovascular: S1 S2 auscultated, no rubs, murmurs or gallops. Regular rate and rhythm.  Respiratory: Clear to auscultation  bilaterally, no wheezing, rales or rhonchi  Gastrointestinal: Soft, nontender, nondistended, + bowel sounds  Ext: no cyanosis clubbing or edema  Neuro: AAOx3, Cr N's II- XII. Strength 5/5 upper and lower extremities bilaterally  Skin: No rashes  Psych: Normal affect and demeanor, alert and oriented x3    Data Reviewed:  I have personally reviewed following labs and imaging studies  Micro Results No results found for this or any previous visit (from the past 240 hour(s)).  Radiology Reports Dg Lumbar Spine Complete  03/20/2016  CLINICAL DATA:  Low back pain post fall, has had low back pain for 30 years EXAM: LUMBAR SPINE - COMPLETE 4+ VIEW COMPARISON:  MRI lumbar spine 02/14/2015 FINDINGS: Five non-rib-bearing lumbar vertebra. Bones demineralized. Multilevel disc space narrowing throughout lumbar spine. Scattered vacuum phenomenon. Irregularity at superior endplate L5 unchanged. Facet degenerative changes at lower lumbar spine. Mild retrolisthesis L3-L4 unchanged. Vertebral body heights maintained without fracture or additional subluxation. No bone destruction or spondylolysis. SI joints preserved. 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 No acute abnormalities. Electronically Signed   By: Lavonia Dana M.D.   On: 03/20/2016 12:25   Dg Pelvis 1-2 Views  03/20/2016  CLINICAL DATA:  Back pain post fall, has had low back pain for 30 years EXAM: PELVIS - 1-2 VIEW COMPARISON:  None FINDINGS: Hip joints preserved and symmetric. Slight irregularity of the LEFT SI joint versus RIGHT, though patient is minimally rotated; subtle asymmetric LEFT sacroiliitis not excluded. Degenerative disc disease changes at visualized lower lumbar spine. No fracture, dislocation or bone destruction. IMPRESSION: No acute bony injuries identified. Degenerative disc disease changes lower lumbar spine. Cannot exclude subtle LEFT sacroiliitis. Electronically Signed   By:  Lavonia Dana M.D.   On: 03/20/2016 12:26   Ct Head Wo Contrast  03/20/2016  CLINICAL DATA:  Multiple falls, no loss consciousness EXAM: CT HEAD WITHOUT CONTRAST TECHNIQUE: Contiguous axial images were obtained from the base of the skull through the vertex without intravenous contrast. COMPARISON:  02/11/2016 FINDINGS: There is no evidence of mass effect, midline shift, or extra-axial fluid collections. There is no evidence of a space-occupying lesion or intracranial hemorrhage. There is no evidence of a cortical-based area of acute infarction. There is generalized cerebral atrophy. There is periventricular white matter low attenuation likely secondary to microangiopathy. The ventricles and sulci are appropriate  for the patient's age. The basal cisterns are patent. Visualized portions of the orbits are unremarkable. The visualized portions of the paranasal sinuses and mastoid air cells are unremarkable. Cerebrovascular atherosclerotic calcifications are noted. The osseous structures are unremarkable. IMPRESSION: 1. No acute intracranial pathology. 2. Chronic microvascular disease and cerebral atrophy. Electronically Signed   By: Kathreen Devoid   On: 03/20/2016 13:25   Mr Lumbar Spine Wo Contrast  03/21/2016  CLINICAL DATA:  Low back pain and right leg pain with weakness. EXAM: MRI LUMBAR SPINE WITHOUT CONTRAST TECHNIQUE: Multiplanar, multisequence MR imaging of the lumbar spine was performed. No intravenous contrast was administered. COMPARISON:  02/14/2015 FINDINGS: Segmentation: Standard. Alignment: Chronic L4-5 grade 1 anterolisthesis and L3-4 mild retrolisthesis. Vertebrae: No signal abnormality to suggest fracture, discitis, or mass. Conus: Extends to the L1 level and appears normal. Paraspinal and retroperitoneal structures: Negative. Disc levels: T12- L1: Negative L1-L2: Disc: Narrowing with endplate ridging. Chronic small left paracentral disc extrusion with upward migration Facets: Facet arthropathy with  bony and ligamentous overgrowth Canal: Progressive stenosis that is moderate, with cauda equina crowding. Stenosis is most notable in the subarticular recesses. The left paracentral disc protrusion could focally impinge on the exiting L1 nerve. Foramina: Bilateral narrowing, worse on the right where it is moderate. L2-L3: Disc: Advanced disc narrowing with endplate ridging. Left paracentral disc extrusion with superior migration that is chronic Facets: Arthropathy with bony and ligamentous overgrowth Canal: High-grade narrowing with near complete effacement of CSF, progressed. Foramina: Right more than left stenosis with impingement on the right L2 nerve. L3-L4: Disc: Narrowing and bulging. Facets: Advanced arthropathy with marked ligamentum flavum thickening and buckling Canal: Progressive stenosis with minimal residual CSF present. Foramina: Bilateral stenosis with complete effacement of perineural fat, stable. L4-L5: Disc: Narrowing and biforaminal predominant bulging or protrusion. Facets: Advanced arthropathy.  Changes of laminotomy on the right Canal: Distorted thecal sac with bilateral subarticular recess effacement. Foramina: Bilateral stenosis, severe on the left and moderate on the right. L5-S1: Disc: Bulging. Facets: Advanced arthropathy with joint effusions Canal: Bilateral subarticular recess narrowing without definitive impingement Foramina: Moderate to advanced right stenosis. IMPRESSION: 1. 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 Underlying degenerative changes are described above, including chronic extrusions at L1-2 and L2-3. 2. Stable bilateral subarticular recess stenosis and potential L5 impingement at the postoperative L4-5 level. 3. Left foraminal stenosis mainly at L3-4 and L4-5, severe at L4-5. 4. Moderate right foraminal stenosis from L1-2 to L5-S1. Electronically Signed   By: Monte Fantasia M.D.   On: 03/21/2016 11:29    Lab  Data:  CBC:  Recent Labs Lab 03/20/16 1134 03/20/16 1541  WBC 6.7 5.8  NEUTROABS 5.0  --   HGB 12.0* 11.1*  HCT 39.2 36.1*  MCV 83.9 83.8  PLT 290 0000000   Basic Metabolic Panel:  Recent Labs Lab 03/20/16 1134 03/20/16 1541  NA 144  --   K 4.1  --   CL 105  --   CO2 26  --   GLUCOSE 99  --   BUN 21*  --   CREATININE 1.04 0.99  CALCIUM 9.3  --    GFR: Estimated Creatinine Clearance: 76.8 mL/min (by C-G formula based on Cr of 0.99). Liver Function Tests:  Recent Labs Lab 03/20/16 1134  AST 29  ALT 10*  ALKPHOS 55  BILITOT 1.2  PROT 6.9  ALBUMIN 3.9   No results for input(s): LIPASE, AMYLASE in the last 168 hours. No results  for input(s): AMMONIA in the last 168 hours. Coagulation Profile: No results for input(s): INR, PROTIME in the last 168 hours. Cardiac Enzymes: No results for input(s): CKTOTAL, CKMB, CKMBINDEX, TROPONINI in the last 168 hours. BNP (last 3 results) No results for input(s): PROBNP in the last 8760 hours. HbA1C: No results for input(s): HGBA1C in the last 72 hours. CBG:  Recent Labs Lab 03/21/16 0754  GLUCAP 114*   Lipid Profile: No results for input(s): CHOL, HDL, LDLCALC, TRIG, CHOLHDL, LDLDIRECT in the last 72 hours. Thyroid Function Tests: No results for input(s): TSH, T4TOTAL, FREET4, T3FREE, THYROIDAB in the last 72 hours. Anemia Panel: No results for input(s): VITAMINB12, FOLATE, FERRITIN, TIBC, IRON, RETICCTPCT in the last 72 hours. Urine analysis:    Component Value Date/Time   COLORURINE YELLOW 03/20/2016 1128   APPEARANCEUR CLEAR 03/20/2016 1128   LABSPEC 1.023 03/20/2016 1128   PHURINE 6.0 03/20/2016 1128   GLUCOSEU NEGATIVE 03/20/2016 1128   HGBUR NEGATIVE 03/20/2016 1128   BILIRUBINUR NEGATIVE 03/20/2016 1128   KETONESUR 15* 03/20/2016 1128   PROTEINUR NEGATIVE 03/20/2016 1128   UROBILINOGEN 0.2 02/13/2015 1957   NITRITE NEGATIVE 03/20/2016 [place] 03/20/2016 1128     Erric Machnik  M.D. [facility] 03/21/2016, 11:53 AM  Pager: [phone] Between 7am to 7pm - call Pager - [phone]  After 7pm go to [email or web address] - password [email or web address]  Call night coverage person covering after 7pm

## 2016-03-21 NOTE — Progress Notes (Addendum)
Nutrition Brief Note  RD consulted to assess nutritional status/ needs.   Wt Readings from Last 15 Encounters:  03/21/16 197 lb 6.4 oz (89.54 kg)  01/23/16 186 lb (84.369 kg)  01/11/16 186 lb (84.369 kg)  12/21/15 184 lb (83.462 kg)  12/19/15 184 lb 9.6 oz (83.734 kg)  02/14/15 191 lb 6.4 oz (86.818 kg)  09/29/14 184 lb (83.462 kg)  08/14/13 189 lb (85.73 kg)  05/19/09 192 lb (87.091 kg)   Evan Charles is a pleasant 70 y.o. male the past medical history that includes mild dementia, hypertension, chronic low back pain, psychosis. Patient presents to emergency department from home via EMS with a chief complaint of intractable back pain inability to walk. Initial evaluation yields patient with continued inability to bear weight.   Pt admitted with intractable back pain. Noted pt was previously residing in a SNF, but left AMA.   Spoke with pt at bedside. He confirms he lives at home by himself. He reports that he "spends all day eating" due to boredom ("no one comes to visit me anymore"). Common foods consist of fruits (oranges, tangerines), vegetables, and chicken nuggets. Pt reveals that the majority of his food comes from fast food and restaurant take-out. He denies any nutritional difficulties, but admits to this RD that he has gained weight due to dietary indiscretion; "I've gone up from 185# to about 200# because I haven't slowed down on my chicken nuggets".   Pt confirms he consumed 100% of his breakfast.   Pt awaiting PT/OT and CSW consults. Pt may need SNF placement. Per chart review, noted concern about pt's ability to care for himself at home.   Nutrition-Focused physical exam completed. Findings are no fat depletion, no muscle depletion, and no edema. Pt denies any recent changes in activity level.   Discussed importance of good meal intake to promote healing. Pt denies any further nutrition related questions or concerns, but expressed appreciation for visit.   Body mass  index is 29.14 kg/(m^2). Patient meets criteria for overweight based on current BMI.   Current diet order is Heart Healthy diet, patient is consuming approximately 100% of meals at this time. Labs and medications reviewed.   No nutrition interventions warranted at this time. If nutrition issues arise, please consult RD.   Chaselyn Nanney A. Jimmye Norman, RD, LDN, CDE Pager: [phone] After hours Pager: [phone]

## 2016-03-22 DIAGNOSIS — R627 Adult failure to thrive: Secondary | ICD-10-CM | POA: Diagnosis not present

## 2016-03-22 DIAGNOSIS — M545 Low back pain: Secondary | ICD-10-CM | POA: Diagnosis not present

## 2016-03-22 DIAGNOSIS — K922 Gastrointestinal hemorrhage, unspecified: Secondary | ICD-10-CM | POA: Diagnosis not present

## 2016-03-22 DIAGNOSIS — M4806 Spinal stenosis, lumbar region: Secondary | ICD-10-CM | POA: Diagnosis not present

## 2016-03-22 DIAGNOSIS — R600 Localized edema: Secondary | ICD-10-CM | POA: Diagnosis not present

## 2016-03-22 DIAGNOSIS — M4807 Spinal stenosis, lumbosacral region: Secondary | ICD-10-CM | POA: Diagnosis not present

## 2016-03-22 DIAGNOSIS — G8929 Other chronic pain: Secondary | ICD-10-CM | POA: Diagnosis not present

## 2016-03-22 DIAGNOSIS — M5440 Lumbago with sciatica, unspecified side: Secondary | ICD-10-CM | POA: Diagnosis not present

## 2016-03-22 DIAGNOSIS — Z87891 Personal history of nicotine dependence: Secondary | ICD-10-CM | POA: Diagnosis not present

## 2016-03-22 DIAGNOSIS — I10 Essential (primary) hypertension: Secondary | ICD-10-CM | POA: Diagnosis not present

## 2016-03-22 DIAGNOSIS — R5381 Other malaise: Secondary | ICD-10-CM | POA: Diagnosis not present

## 2016-03-22 DIAGNOSIS — K219 Gastro-esophageal reflux disease without esophagitis: Secondary | ICD-10-CM | POA: Diagnosis not present

## 2016-03-22 DIAGNOSIS — G3184 Mild cognitive impairment, so stated: Secondary | ICD-10-CM | POA: Diagnosis not present

## 2016-03-22 DIAGNOSIS — R262 Difficulty in walking, not elsewhere classified: Secondary | ICD-10-CM | POA: Diagnosis not present

## 2016-03-22 DIAGNOSIS — R531 Weakness: Secondary | ICD-10-CM | POA: Diagnosis not present

## 2016-03-22 DIAGNOSIS — M549 Dorsalgia, unspecified: Secondary | ICD-10-CM | POA: Diagnosis not present

## 2016-03-22 DIAGNOSIS — M5136 Other intervertebral disc degeneration, lumbar region: Secondary | ICD-10-CM | POA: Diagnosis not present

## 2016-03-22 DIAGNOSIS — M5116 Intervertebral disc disorders with radiculopathy, lumbar region: Secondary | ICD-10-CM | POA: Diagnosis not present

## 2016-03-22 DIAGNOSIS — G629 Polyneuropathy, unspecified: Secondary | ICD-10-CM | POA: Diagnosis not present

## 2016-03-22 DIAGNOSIS — M6281 Muscle weakness (generalized): Secondary | ICD-10-CM | POA: Diagnosis not present

## 2016-03-22 DIAGNOSIS — F29 Unspecified psychosis not due to a substance or known physiological condition: Secondary | ICD-10-CM | POA: Diagnosis not present

## 2016-03-22 LAB — GLUCOSE, CAPILLARY
GLUCOSE-CAPILLARY: 153 mg/dL — AB (ref 65–99)
Glucose-Capillary: 94 mg/dL (ref 65–99)

## 2016-03-22 MED ORDER — TRAMADOL HCL 50 MG PO TABS: 50.0000 mg | ORAL_TABLET | Freq: Four times a day (QID) | ORAL | Status: DC | PRN

## 2016-03-22 MED ORDER — BISACODYL 10 MG RE SUPP: 10.0000 mg | Freq: Every day | RECTAL | Status: DC | PRN

## 2016-03-22 MED ORDER — METHOCARBAMOL 500 MG PO TABS: 500.0000 mg | ORAL_TABLET | Freq: Three times a day (TID) | ORAL | Status: DC

## 2016-03-22 MED ORDER — HYDROCHLOROTHIAZIDE 12.5 MG PO CAPS: 12.5000 mg | ORAL_CAPSULE | Freq: Every day | ORAL | Status: DC

## 2016-03-22 MED ORDER — DOCUSATE SODIUM 100 MG PO CAPS: 100.0000 mg | ORAL_CAPSULE | Freq: Two times a day (BID) | ORAL | Status: DC

## 2016-03-22 MED ORDER — ACETAMINOPHEN 325 MG PO TABS: 650.0000 mg | ORAL_TABLET | Freq: Four times a day (QID) | ORAL | Status: DC | PRN

## 2016-03-22 MED ORDER — MELOXICAM 7.5 MG PO TABS: 7.5000 mg | ORAL_TABLET | Freq: Every day | ORAL | Status: DC

## 2016-03-22 MED ORDER — HYDROCODONE-ACETAMINOPHEN 5-325 MG PO TABS: 1.0000 | ORAL_TABLET | Freq: Four times a day (QID) | ORAL | Status: DC | PRN

## 2016-03-22 MED ORDER — PREDNISONE 10 MG PO TABS: ORAL_TABLET | ORAL | Status: DC

## 2016-03-22 NOTE — Progress Notes (Signed)
Physical Therapy Treatment Patient Details Name: Evan Charles MRN: [id number] DOB: 04-May-1946 Today's Date: 03/22/2016    History of Present Illness Pt is a 70 y/o M who was found on the floor by his nephew at [place].  Upon EMS arrival he was unable to stand by himself. Pt was recently d/c in April after a fall at home. Pt's PMH includes back pain, colitis, prostate cancer, neuropathy ("hands"), chronic mental illness, laminectomy.    PT Comments    Patient currently min/mod A for OOB mobility and required mod/max multimodal cues to address deficits with pt unaware of level of balance deficits. Pt able to ambulate 39ft this session with RW with mod A. Current plan remains appropriate.   Follow Up Recommendations  SNF;Supervision/Assistance - 24 hour     Equipment Recommendations  Other (comment) (TBD at next venue of care)    Recommendations for Other Services OT consult     Precautions / Restrictions Precautions Precautions: Fall Restrictions Weight Bearing Restrictions: No    Mobility  Bed Mobility Overal bed mobility: Needs Assistance Bed Mobility: Supine to Sit     Supine to sit: Min assist     General bed mobility comments: use of bedrail and assist to elevate trunk and bring hips to EOB with cues for sequencing and increased time to initiate movement  Transfers Overall transfer level: Needs assistance Equipment used: None Transfers: Sit to/from Stand Sit to Stand: Min assist         General transfer comment: assist to power up into standing, gain balance upon stand, and for safe use of DME with multimodal cues given for hand placement and technique; pt required tactile cues to place hands on RW upon standing  Ambulation/Gait Ambulation/Gait assistance: Mod assist;+2 safety/equipment Ambulation Distance (Feet): 15 Feet Assistive device: Rolling walker (2 wheeled) Gait Pattern/deviations: Shuffle     General Gait Details: pt with shuffling gait pattern  and posterior lean until pt increased gait velocity slightly; cues for safe use of DME, posture, and bilat step length   Stairs            Wheelchair Mobility    Modified Rankin (Stroke Patients Only)       Balance   Sitting-balance support: Bilateral upper extremity supported;Feet supported Sitting balance-Leahy Scale: Fair Sitting balance - Comments: pt able to maintain balance without assist but only for short period of time before leaning posteriorly; required verbal and tactile cues for anterior lean     Standing balance-Leahy Scale: Poor                      Cognition Arousal/Alertness: Awake/alert Behavior During Therapy: WFL for tasks assessed/performed Overall Cognitive Status: No family/caregiver present to determine baseline cognitive functioning Area of Impairment: Safety/judgement;Following commands;Memory;Awareness;Problem solving   Current Attention Level: Selective Memory: Decreased short-term memory Following Commands: Follows one step commands with increased time Safety/Judgement: Decreased awareness of safety;Decreased awareness of deficits Awareness: Emergent Problem Solving: Slow processing;Decreased initiation;Difficulty sequencing;Requires verbal cues;Requires tactile cues General Comments: pt lying with bilat LE off of bed upon entering room and pt reported he was about to get up and walk    Exercises      General Comments        Pertinent Vitals/Pain Pain Assessment: Faces Faces Pain Scale: Hurts a little bit Pain Location: back Pain Descriptors / Indicators: Sore Pain Intervention(s): Monitored during session;Repositioned    Home Living  Prior Function            PT Goals (current goals can now be found in the care plan section) Acute Rehab PT Goals Patient Stated Goal: none stated PT Goal Formulation: Patient unable to participate in goal setting Time For Goal Achievement:  04/03/16 Potential to Achieve Goals: Fair Progress towards PT goals: Progressing toward goals    Frequency  Min 2X/week    PT Plan Current plan remains appropriate    Co-evaluation             End of Session Equipment Utilized During Treatment: Gait belt Activity Tolerance: Patient tolerated treatment well Patient left: with call bell/phone within reach;in chair;with chair alarm set     Time: 1043-1105 PT Time Calculation (min) (ACUTE ONLY): 22 min  Charges:  $Gait Training: 8-22 mins                    G Codes:     [place] April, PTA Pager: [phone]   03/22/2016, 2:30 PM

## 2016-03-22 NOTE — Progress Notes (Signed)
Patient c/o dyspnea on exertion with some audible wheezing heard. Respiratory called and assessed patient to be fine. Will continue to monitor.

## 2016-03-22 NOTE — Clinical Social Work Placement (Signed)
   CLINICAL SOCIAL WORK PLACEMENT  NOTE  Date:  03/22/2016  Patient Details  Name: Evan Charles MRN: [id number] Date of Birth: 04-15-1946  Clinical Social Work is seeking post-discharge placement for this patient at the [place] level of care (*CSW will initial, date and re-position this form in  chart as items are completed):  Yes   Patient/family provided with [place] Work Department's list of facilities offering this level of care within the geographic area requested by the patient (or if unable, by the patient's family).  Yes   Patient/family informed of their freedom to choose among providers that offer the needed level of care, that participate in Medicare, Medicaid or managed care program needed by the patient, have an available bed and are willing to accept the patient.  Yes   Patient/family informed of [employer]'s ownership interest in [facility] and [facility], as well as of the fact that they are under no obligation to receive care at these facilities.  PASRR submitted to EDS on       PASRR number received on       Existing PASRR number confirmed on 03/20/16     FL2 transmitted to all facilities in geographic area requested by pt/family on 03/20/16     FL2 transmitted to all facilities within larger geographic area on       Patient informed that his/her managed care company has contracts with or will negotiate with certain facilities, including the following:        Yes   Patient/family informed of bed offers received.  Patient chooses bed at [facility]     Physician recommends and patient chooses bed at      Patient to be transferred to [facility] on 03/22/16.  Patient to be transferred to facility by ptar     Patient family notified on 03/22/16 of transfer.  Name of family member notified:  michelle     PHYSICIAN Please sign FL2      Additional Comment:    _______________________________________________ Cranford Mon, LCSW 03/22/2016, 1:07 PM

## 2016-03-22 NOTE — Discharge Summary (Signed)
Physician Discharge Summary   Patient ID: Evan Charles MRN: [id number] DOB/AGE: [birth year] 70 y.o.  Admit date: 03/20/2016 Discharge date: 03/22/2016  Primary Care Physician:  No primary care provider on file.  Discharge Diagnoses:      Acute on chronic back pain     Severe spinal stenosis  . Difficulty walking . Immobility . Hypertension . Failure to thrive in adult . Intractable back pain    Consults: None  Recommendations for Outpatient Follow-up:  1. Please repeat CBC/BMET at next visit 2. Please continue physical therapy   DIET: Heart healthy diet    Allergies:  No Known Allergies   DISCHARGE MEDICATIONS: Current Discharge Medication List    START taking these medications   Details  acetaminophen (TYLENOL) 325 MG tablet Take 2 tablets (650 mg total) by mouth every 6 (six) hours as needed for mild pain (or Fever >/= 101).    bisacodyl (DULCOLAX) 10 MG suppository Place 1 suppository (10 mg total) rectally daily as needed for moderate constipation. Qty: 12 suppository, Refills: 0    hydrochlorothiazide (MICROZIDE) 12.5 MG capsule Take 1 capsule (12.5 mg total) by mouth daily.    HYDROcodone-acetaminophen (NORCO/VICODIN) 5-325 MG tablet Take 1-2 tablets by mouth every 6 (six) hours as needed for severe pain. Qty: 30 tablet, Refills: 0    meloxicam (MOBIC) 7.5 MG tablet Take 1 tablet (7.5 mg total) by mouth daily.    methocarbamol (ROBAXIN) 500 MG tablet Take 1 tablet (500 mg total) by mouth 3 (three) times daily. Qty: 30 tablet, Refills: 0    predniSONE (DELTASONE) 10 MG tablet Prednisone dosing: Take  Prednisone 40mg  (4 tabs) x2 days, then taper to 30mg  (3 tabs) x 3 days, then 20mg  (2 tabs) x 3days, then 10mg  (1 tab) x 3days, then OFF.      CONTINUE these medications which have CHANGED   Details  docusate sodium (COLACE) 100 MG capsule Take 1 capsule (100 mg total) by mouth 2 (two) times daily.    traMADol (ULTRAM) 50 MG tablet Take 1 tablet (50 mg  total) by mouth every 6 (six) hours as needed for moderate pain. Qty: 30 tablet, Refills: 0      CONTINUE these medications which have NOT CHANGED   Details  cholecalciferol (VITAMIN D) 1000 units tablet Take 1 tablet (1,000 Units total) by mouth daily. Qty: 30 tablet, Refills: 0    pantoprazole (PROTONIX) 40 MG tablet Take 1 tablet (40 mg total) by mouth 2 (two) times daily. Qty: 60 tablet, Refills: 0    traZODone (DESYREL) 50 MG tablet Take 1 tablet (50 mg total) by mouth at bedtime. Qty: 30 tablet, Refills: 0      STOP taking these medications     furosemide (LASIX) 40 MG tablet      naproxen (NAPROSYN) 500 MG tablet      ARIPiprazole (ABILIFY) 20 MG tablet      OLANZapine (ZYPREXA) 5 MG tablet          Brief H and P: For complete details please refer to admission H and P, but in briefPatient is 70 year old male with mild dementia, hypertension, chronic low back pain presented with intractable back pain, inability to walk or bear weight. Patient's family also concerned about his ability to care for himself. Patient was found by his nephew on the floor unable to get up and over the last several days, chronic lower back pain has worsened and radiating to the legs. Patient was admitted for further workup.  Hospital  Course:   Intractable Low back pain with radiculopathy. Patient with long history of chronic back pain. X-ray shows no acute bony injuries but degenerative disc disease and lumbar spine. -MRI of the lumbar spine showed moderate to advanced spinal Stenosis stenosis from L1-L2 to L3-L4 progressed since 4/16, underlying degenerative changes including chronic extrusions at L1-L2 and L2-3. - Patient was placed on steroids, on tramadol for moderate pain, Mobic, Norco for severe pain, Robaxin -Patient recommended skilled nursing facility for physical therapy  Hypertension.  -Currently stable, started on HCTZ   Generalized weakness/failure to thrive.  - Lives alone,  appears to be unable to take care of himself and his pets in the house.     Hallucinations/psychosis. History of same. Appears to be stable at baseline, currently cooperative.  Patient had not been taking his psych medications prior to admission, hence they were not started. He has been pleasant and cooperative during hospitalization with no psychosis or hallucination episodes.   Day of Discharge BP 143/92 mmHg  Pulse 69  Temp(Src) 98 F (36.7 C) (Oral)  Resp 20  Ht 5\' 9"  (1.753 m)  Wt 89.54 kg (197 lb 6.4 oz)  BMI 29.14 kg/m2  SpO2 100%  Physical Exam: General: Alert and awake oriented x3 not in any acute distress. HEENT: anicteric sclera, pupils reactive to light and accommodation CVS: S1-S2 clear no murmur rubs or gallops Chest: clear to auscultation bilaterally, no wheezing rales or rhonchi Abdomen: soft nontender, nondistended, normal bowel sounds Extremities: no cyanosis, clubbing or edema noted bilaterally Neuro: Cranial nerves II-XII intact, no focal neurological deficits   The results of significant diagnostics from this hospitalization (including imaging, microbiology, ancillary and laboratory) are listed below for reference.    LAB RESULTS: Basic Metabolic Panel:  Recent Labs Lab 03/20/16 1134 03/20/16 1541  NA 144  --   K 4.1  --   CL 105  --   CO2 26  --   GLUCOSE 99  --   BUN 21*  --   CREATININE 1.04 0.99  CALCIUM 9.3  --    Liver Function Tests:  Recent Labs Lab 03/20/16 1134  AST 29  ALT 10*  ALKPHOS 55  BILITOT 1.2  PROT 6.9  ALBUMIN 3.9   No results for input(s): LIPASE, AMYLASE in the last 168 hours. No results for input(s): AMMONIA in the last 168 hours. CBC:  Recent Labs Lab 03/20/16 1134 03/20/16 1541  WBC 6.7 5.8  NEUTROABS 5.0  --   HGB 12.0* 11.1*  HCT 39.2 36.1*  MCV 83.9 83.8  PLT 290 261   Cardiac Enzymes: No results for input(s): CKTOTAL, CKMB, CKMBINDEX, TROPONINI in the last 168 hours. BNP: Invalid input(s):  POCBNP CBG:  Recent Labs Lab 03/21/16 2147 03/22/16 0825  GLUCAP 141* 94    Significant Diagnostic Studies:  Dg Lumbar Spine Complete  03/20/2016  CLINICAL DATA:  Low back pain post fall, has had low back pain for 30 years EXAM: LUMBAR SPINE - COMPLETE 4+ VIEW COMPARISON:  MRI lumbar spine 02/14/2015 FINDINGS: Five non-rib-bearing lumbar vertebra. Bones demineralized. Multilevel disc space narrowing throughout lumbar spine. Scattered vacuum phenomenon. Irregularity at superior endplate L5 unchanged. Facet degenerative changes at lower lumbar spine. Mild retrolisthesis L3-L4 unchanged. Vertebral body heights maintained without fracture or additional subluxation. No bone destruction or spondylolysis. SI joints preserved. 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 No acute abnormalities. Electronically Signed   By: Lavonia Dana M.D.   On:  03/20/2016 12:25   Dg Pelvis 1-2 Views  03/20/2016  CLINICAL DATA:  Back pain post fall, has had low back pain for 30 years EXAM: PELVIS - 1-2 VIEW COMPARISON:  None FINDINGS: Hip joints preserved and symmetric. Slight irregularity of the LEFT SI joint versus RIGHT, though patient is minimally rotated; subtle asymmetric LEFT sacroiliitis not excluded. Degenerative disc disease changes at visualized lower lumbar spine. No fracture, dislocation or bone destruction. IMPRESSION: No acute bony injuries identified. Degenerative disc disease changes lower lumbar spine. Cannot exclude subtle LEFT sacroiliitis. Electronically Signed   By: Lavonia Dana M.D.   On: 03/20/2016 12:26   Ct Head Wo Contrast  03/20/2016  CLINICAL DATA:  Multiple falls, no loss consciousness EXAM: CT HEAD WITHOUT CONTRAST TECHNIQUE: Contiguous axial images were obtained from the base of the skull through the vertex without intravenous contrast. COMPARISON:  02/11/2016 FINDINGS: There is no evidence of mass effect, midline shift, or extra-axial fluid  collections. There is no evidence of a space-occupying lesion or intracranial hemorrhage. There is no evidence of a cortical-based area of acute infarction. There is generalized cerebral atrophy. There is periventricular white matter low attenuation likely secondary to microangiopathy. The ventricles and sulci are appropriate for the patient's age. The basal cisterns are patent. Visualized portions of the orbits are unremarkable. The visualized portions of the paranasal sinuses and mastoid air cells are unremarkable. Cerebrovascular atherosclerotic calcifications are noted. The osseous structures are unremarkable. IMPRESSION: 1. No acute intracranial pathology. 2. Chronic microvascular disease and cerebral atrophy. Electronically Signed   By: Kathreen Devoid   On: 03/20/2016 13:25   Mr Lumbar Spine Wo Contrast  03/21/2016  CLINICAL DATA:  Low back pain and right leg pain with weakness. EXAM: MRI LUMBAR SPINE WITHOUT CONTRAST TECHNIQUE: Multiplanar, multisequence MR imaging of the lumbar spine was performed. No intravenous contrast was administered. COMPARISON:  02/14/2015 FINDINGS: Segmentation: Standard. Alignment: Chronic L4-5 grade 1 anterolisthesis and L3-4 mild retrolisthesis. Vertebrae: No signal abnormality to suggest fracture, discitis, or mass. Conus: Extends to the L1 level and appears normal. Paraspinal and retroperitoneal structures: Negative. Disc levels: T12- L1: Negative L1-L2: Disc: Narrowing with endplate ridging. Chronic small left paracentral disc extrusion with upward migration Facets: Facet arthropathy with bony and ligamentous overgrowth Canal: Progressive stenosis that is moderate, with cauda equina crowding. Stenosis is most notable in the subarticular recesses. The left paracentral disc protrusion could focally impinge on the exiting L1 nerve. Foramina: Bilateral narrowing, worse on the right where it is moderate. L2-L3: Disc: Advanced disc narrowing with endplate ridging. Left paracentral  disc extrusion with superior migration that is chronic Facets: Arthropathy with bony and ligamentous overgrowth Canal: High-grade narrowing with near complete effacement of CSF, progressed. Foramina: Right more than left stenosis with impingement on the right L2 nerve. L3-L4: Disc: Narrowing and bulging. Facets: Advanced arthropathy with marked ligamentum flavum thickening and buckling Canal: Progressive stenosis with minimal residual CSF present. Foramina: Bilateral stenosis with complete effacement of perineural fat, stable. L4-L5: Disc: Narrowing and biforaminal predominant bulging or protrusion. Facets: Advanced arthropathy.  Changes of laminotomy on the right Canal: Distorted thecal sac with bilateral subarticular recess effacement. Foramina: Bilateral stenosis, severe on the left and moderate on the right. L5-S1: Disc: Bulging. Facets: Advanced arthropathy with joint effusions Canal: Bilateral subarticular recess narrowing without definitive impingement Foramina: Moderate to advanced right stenosis. IMPRESSION: 1. 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 Underlying degenerative changes are described above, including chronic extrusions at L1-2 and L2-3.  2. Stable bilateral subarticular recess stenosis and potential L5 impingement at the postoperative L4-5 level. 3. Left foraminal stenosis mainly at L3-4 and L4-5, severe at L4-5. 4. Moderate right foraminal stenosis from L1-2 to L5-S1. Electronically Signed   By: Monte Fantasia M.D.   On: 03/21/2016 11:29    2D ECHO:   Disposition and Follow-up: Discharge Instructions    Diet - low sodium heart healthy    Complete by:  As directed      Increase activity slowly    Complete by:  As directed             DISPOSITION: Skilled nursing facility   DISCHARGE FOLLOW-UP Follow-up Information    Schedule an appointment as soon as possible for a visit in 1 week to follow up.   Why:  for hospital follow-up    Contact information:   Follow-up with primary care physician at the facility       Time spent on Discharge: 25 minutes  Signed:   RAI,RIPUDEEP M.D. [facility] 03/22/2016, 12:04 PM Pager: [phone]

## 2016-03-22 NOTE — Progress Notes (Signed)
Patient will discharge to [facility] Anticipated discharge date: 5/11 Family notified: [name] (dtr) Transportation by Corey Harold- scheduled for 1:30pm  CSW signing off.  Domenica Reamer, [place] Social Worker [phone]
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
## 2016-03-22 NOTE — Progress Notes (Addendum)
Nsg Discharge Note  Admit Date:  03/20/2016 Discharge date: 03/22/2016   Chucky May to be D/C'd Skilled nursing facility per MD order.  AVS completed.  Copy for chart, and copy for patient signed, and dated. Patient/caregiver able to verbalize understanding.  Discharge Medication:   Medication List    STOP taking these medications        ARIPiprazole 20 MG tablet  Commonly known as:  ABILIFY     furosemide 40 MG tablet  Commonly known as:  LASIX     naproxen 500 MG tablet  Commonly known as:  NAPROSYN     OLANZapine 5 MG tablet  Commonly known as:  ZYPREXA      TAKE these medications        acetaminophen 325 MG tablet  Commonly known as:  TYLENOL  Take 2 tablets (650 mg total) by mouth every 6 (six) hours as needed for mild pain (or Fever >/= 101).     bisacodyl 10 MG suppository  Commonly known as:  DULCOLAX  Place 1 suppository (10 mg total) rectally daily as needed for moderate constipation.     cholecalciferol 1000 units tablet  Commonly known as:  VITAMIN D  Take 1 tablet (1,000 Units total) by mouth daily.     docusate sodium 100 MG capsule  Commonly known as:  COLACE  Take 1 capsule (100 mg total) by mouth 2 (two) times daily.     hydrochlorothiazide 12.5 MG capsule  Commonly known as:  MICROZIDE  Take 1 capsule (12.5 mg total) by mouth daily.     HYDROcodone-acetaminophen 5-325 MG tablet  Commonly known as:  NORCO/VICODIN  Take 1-2 tablets by mouth every 6 (six) hours as needed for severe pain.     meloxicam 7.5 MG tablet  Commonly known as:  MOBIC  Take 1 tablet (7.5 mg total) by mouth daily.     methocarbamol 500 MG tablet  Commonly known as:  ROBAXIN  Take 1 tablet (500 mg total) by mouth 3 (three) times daily.     pantoprazole 40 MG tablet  Commonly known as:  PROTONIX  Take 1 tablet (40 mg total) by mouth 2 (two) times daily.     predniSONE 10 MG tablet  Commonly known as:  DELTASONE  Prednisone dosing: Take  Prednisone 40mg  (4 tabs)  x2 days, then taper to 30mg  (3 tabs) x 3 days, then 20mg  (2 tabs) x 3days, then 10mg  (1 tab) x 3days, then OFF.  Start taking on:  03/23/2016     traMADol 50 MG tablet  Commonly known as:  ULTRAM  Take 1 tablet (50 mg total) by mouth every 6 (six) hours as needed for moderate pain.     traZODone 50 MG tablet  Commonly known as:  DESYREL  Take 1 tablet (50 mg total) by mouth at bedtime.        Discharge Assessment: Filed Vitals:   03/21/16 2149 03/22/16 0440  BP: 125/72 143/92  Pulse: 73 69  Temp: 98 F (36.7 C) 98 F (36.7 C)  Resp: 20 20   Skin clean, dry and intact without evidence of skin break down, no evidence of skin tears noted. IV catheter discontinued intact. Site without signs and symptoms of complications - no redness or edema noted at insertion site, patient denies c/o pain - only slight tenderness at site.  Dressing with slight pressure applied.  D/c Instructions-Education: Discharge instructions given to patient/family with verbalized understanding. D/c education completed with patient/family including follow  up instructions, medication list, d/c activities limitations if indicated, with other d/c instructions as indicated by MD - patient able to verbalize understanding, all questions fully answered. Patient instructed to return to ED, call 911, or call MD for any changes in condition.  Patient escorted via EMS. Report called into [facility].   Dayle Points, RN 03/22/2016 2:02 PM

## 2016-03-27 ENCOUNTER — Encounter: Payer: Self-pay | Admitting: Internal Medicine

## 2016-03-27 NOTE — Progress Notes (Signed)
HISTORY AND PHYSICAL   DATE: Mar 27, 2016  Location:  [facility] Room Number: 106 Place of Service: SNF [phone])   Extended Emergency Contact Information Primary Emergency Contact: Anderson,Edith Address: [address]          [place] [address] Johnnette Litter of [place] Phone: [phone] Relation: Spouse Secondary Emergency Contact: Simpson,Michelle  [place] Phone: [phone] Relation: Daughter  Advanced Directive information    Chief Complaint  Patient presents with  . New Admit To SNF    New Admission    HPI: Full Code   Past Medical History  Diagnosis Date  . Back pain   . Colitis   . Prostate cancer ([place])   . GERD (gastroesophageal reflux disease)   . Neuropathy ([place])     " MY HANDS "  . Hypertension   . Chronic mental illness     Past Surgical History  Procedure Laterality Date  . Prostatectomy    . Laminectomy    . Knee arthroscopy      No care team member to display  Social History   Social History  . Marital Status: Single    Spouse Name: N/A  . Number of Children: N/A  . Years of Education: N/A   Occupational History  . Not on file.   Social History Main Topics  . Smoking status: Former Smoker    Types: Cigarettes  . Smokeless tobacco: Never Used  . Alcohol Use: No  . Drug Use: No  . Sexual Activity: Not on file   Other Topics Concern  . Not on file   Social History Narrative     reports that he has quit smoking. His smoking use included Cigarettes. He has never used smokeless tobacco. He reports that he does not drink alcohol or use illicit drugs.  Family History  Problem Relation Age of Onset  . Cancer Brother   . Cancer Maternal Grandmother    No family status information on file.    Immunization History  Administered Date(s) Administered  . DTaP 06/09/2001  . Tdap 08/14/2013    No Known Allergies  Medications: Patient's Medications  New  Prescriptions   No medications on file  Previous Medications   ACETAMINOPHEN (TYLENOL) 325 MG TABLET    Take 2 tablets (650 mg total) by mouth every 6 (six) hours as needed for mild pain (or Fever >/= 101).   BISACODYL (DULCOLAX) 10 MG SUPPOSITORY    Place 1 suppository (10 mg total) rectally daily as needed for moderate constipation.   CHOLECALCIFEROL (VITAMIN D) 1000 UNITS TABLET    Take 1 tablet (1,000 Units total) by mouth daily.   DOCUSATE SODIUM (COLACE) 100 MG CAPSULE    Take 1 capsule (100 mg total) by mouth 2 (two) times daily.   HYDROCHLOROTHIAZIDE (MICROZIDE) 12.5 MG CAPSULE    Take 1 capsule (12.5 mg total) by mouth daily.   HYDROCODONE-ACETAMINOPHEN (NORCO/VICODIN) 5-325 MG TABLET    Take 1-2 tablets by mouth every 6 (six) hours as needed for severe pain.   MELOXICAM (MOBIC) 7.5 MG TABLET    Take 1 tablet (7.5 mg total) by mouth daily.   METHOCARBAMOL (ROBAXIN) 500 MG TABLET    Take 1 tablet (500 mg total) by mouth 3 (three) times daily.   PANTOPRAZOLE (PROTONIX) 40 MG TABLET    Take 1 tablet (40 mg total) by mouth 2 (two) times daily.   PREDNISONE (DELTASONE) 10 MG TABLET  Prednisone dosing: Take  Prednisone 65m (4 tabs) x2 days, then taper to 328m(3 tabs) x 3 days, then 2016m2 tabs) x 3days, then 30m67m tab) x 3days, then OFF.   TRAZODONE (DESYREL) 50 MG TABLET    Take 1 tablet (50 mg total) by mouth at bedtime.  Modified Medications   No medications on file  Discontinued Medications   TRAMADOL (ULTRAM) 50 MG TABLET    Take 1 tablet (50 mg total) by mouth every 6 (six) hours as needed for moderate pain.    Review of Systems  Filed Vitals:   03/27/16 1032  BP: 111/76  Pulse: 94  Temp: 98.1 F (36.7 C)  TempSrc: Oral  Resp: 18  Height: _0  (1.753 m)  Weight: 197 lb (89.359 kg)  SpO2: 93%   Body mass index is 29.08 kg/(m^2).  Physical Exam   Labs reviewed: Nursing Home on 03/27/2016  Component Date Value Ref Range Status  . WBC 03/20/2016 5.8   Final  .  Glucose 03/20/2016 99   Final  . BUN 03/20/2016 21  4 - 21 mg/dL Final  . Creatinine 03/20/2016 1.0  0.6 - 1.3 mg/dL Final  . Sodium 03/20/2016 144  137 - 147 mmol/L Final  . Bilirubin, Total 03/20/2016 1.2   Final  Admission on 03/20/2016, Discharged on 03/22/2016  Component Date Value Ref Range Status  . WBC 03/20/2016 6.7  4.0 - 10.5 K/uL Final  . RBC 03/20/2016 4.67  4.22 - 5.81 MIL/uL Final  . Hemoglobin 03/20/2016 12.0* 13.0 - 17.0 g/dL Final  . HCT 03/20/2016 39.2  39.0 - 52.0 % Final  . MCV 03/20/2016 83.9  78.0 - 100.0 fL Final  . MCH 03/20/2016 25.7* 26.0 - 34.0 pg Final  . MCHC 03/20/2016 30.6  30.0 - 36.0 g/dL Final  . RDW 03/20/2016 14.9  11.5 - 15.5 % Final  . Platelets 03/20/2016 290  150 - 400 K/uL Final  . Neutrophils Relative % 03/20/2016 75   Final  . Neutro Abs 03/20/2016 5.0  1.7 - 7.7 K/uL Final  . Lymphocytes Relative 03/20/2016 17   Final  . Lymphs Abs 03/20/2016 1.1  0.7 - 4.0 K/uL Final  . Monocytes Relative 03/20/2016 5   Final  . Monocytes Absolute 03/20/2016 0.4  0.1 - 1.0 K/uL Final  . Eosinophils Relative 03/20/2016 2   Final  . Eosinophils Absolute 03/20/2016 0.1  0.0 - 0.7 K/uL Final  . Basophils Relative 03/20/2016 1   Final  . Basophils Absolute 03/20/2016 0.0  0.0 - 0.1 K/uL Final  . Sodium 03/20/2016 144  135 - 145 mmol/L Final  . Potassium 03/20/2016 4.1  3.5 - 5.1 mmol/L Final  . Chloride 03/20/2016 105  101 - 111 mmol/L Final  . CO2 03/20/2016 26  22 - 32 mmol/L Final  . Glucose, Bld 03/20/2016 99  65 - 99 mg/dL Final  . BUN 03/20/2016 21* 6 - 20 mg/dL Final  . Creatinine, Ser 03/20/2016 1.04  0.61 - 1.24 mg/dL Final  . Calcium 03/20/2016 9.3  8.9 - 10.3 mg/dL Final  . Total Protein 03/20/2016 6.9  6.5 - 8.1 g/dL Final  . Albumin 03/20/2016 3.9  3.5 - 5.0 g/dL Final  . AST 03/20/2016 29  15 - 41 U/L Final  . ALT 03/20/2016 10* 17 - 63 U/L Final  . Alkaline Phosphatase 03/20/2016 55  38 - 126 U/L Final  . Total Bilirubin 03/20/2016 1.2   0.3 - 1.2 mg/dL Final  .  GFR calc non Af Amer 03/20/2016 >60  >60 mL/min Final  . GFR calc Af Amer 03/20/2016 >60  >60 mL/min Final   Comment: (NOTE) The eGFR has been calculated using the CKD EPI equation. This calculation has not been validated in all clinical situations. eGFR's persistently <60 mL/min signify possible Chronic Kidney Disease.   . Anion gap 03/20/2016 13  5 - 15 Final  . Color, Urine 03/20/2016 YELLOW  YELLOW Final  . APPearance 03/20/2016 CLEAR  CLEAR Final  . Specific Gravity, Urine 03/20/2016 1.023  1.005 - 1.030 Final  . pH 03/20/2016 6.0  5.0 - 8.0 Final  . Glucose, UA 03/20/2016 NEGATIVE  NEGATIVE mg/dL Final  . Hgb urine dipstick 03/20/2016 NEGATIVE  NEGATIVE Final  . Bilirubin Urine 03/20/2016 NEGATIVE  NEGATIVE Final  . Ketones, ur 03/20/2016 15* NEGATIVE mg/dL Final  . Protein, ur 03/20/2016 NEGATIVE  NEGATIVE mg/dL Final  . Nitrite 03/20/2016 NEGATIVE  NEGATIVE Final  . Leukocytes, UA 03/20/2016 NEGATIVE  NEGATIVE Final   MICROSCOPIC NOT DONE ON URINES WITH NEGATIVE PROTEIN, BLOOD, LEUKOCYTES, NITRITE, OR GLUCOSE <1000 mg/dL.  . WBC 03/20/2016 5.8  4.0 - 10.5 K/uL Final  . RBC 03/20/2016 4.31  4.22 - 5.81 MIL/uL Final  . Hemoglobin 03/20/2016 11.1* 13.0 - 17.0 g/dL Final  . HCT 03/20/2016 36.1* 39.0 - 52.0 % Final  . MCV 03/20/2016 83.8  78.0 - 100.0 fL Final  . MCH 03/20/2016 25.8* 26.0 - 34.0 pg Final  . MCHC 03/20/2016 30.7  30.0 - 36.0 g/dL Final  . RDW 03/20/2016 15.0  11.5 - 15.5 % Final  . Platelets 03/20/2016 261  150 - 400 K/uL Final  . Creatinine, Ser 03/20/2016 0.99  0.61 - 1.24 mg/dL Final  . GFR calc non Af Amer 03/20/2016 >60  >60 mL/min Final  . GFR calc Af Amer 03/20/2016 >60  >60 mL/min Final   Comment: (NOTE) The eGFR has been calculated using the CKD EPI equation. This calculation has not been validated in all clinical situations. eGFR's persistently <60 mL/min signify possible Chronic Kidney Disease.   . Glucose-Capillary  03/21/2016 114* 65 - 99 mg/dL Final  . Glucose-Capillary 03/21/2016 108* 65 - 99 mg/dL Final  . Glucose-Capillary 03/21/2016 143* 65 - 99 mg/dL Final  . Glucose-Capillary 03/21/2016 141* 65 - 99 mg/dL Final  . Glucose-Capillary 03/22/2016 94  65 - 99 mg/dL Final  . Glucose-Capillary 03/22/2016 153* 65 - 99 mg/dL Final  Admission on 02/17/2016, Discharged on 02/18/2016  Component Date Value Ref Range Status  . Sodium 02/17/2016 140  135 - 145 mmol/L Final  . Potassium 02/17/2016 3.5  3.5 - 5.1 mmol/L Final  . Chloride 02/17/2016 101  101 - 111 mmol/L Final  . CO2 02/17/2016 26  22 - 32 mmol/L Final  . Glucose, Bld 02/17/2016 78  65 - 99 mg/dL Final  . BUN 02/17/2016 16  6 - 20 mg/dL Final  . Creatinine, Ser 02/17/2016 0.86  0.61 - 1.24 mg/dL Final  . Calcium 02/17/2016 9.3  8.9 - 10.3 mg/dL Final  . Total Protein 02/17/2016 7.7  6.5 - 8.1 g/dL Final  . Albumin 02/17/2016 4.5  3.5 - 5.0 g/dL Final  . AST 02/17/2016 57* 15 - 41 U/L Final  . ALT 02/17/2016 34  17 - 63 U/L Final  . Alkaline Phosphatase 02/17/2016 58  38 - 126 U/L Final  . Total Bilirubin 02/17/2016 0.8  0.3 - 1.2 mg/dL Final  . GFR calc non Af Amer 02/17/2016 >60  >  60 mL/min Final  . GFR calc Af Amer 02/17/2016 >60  >60 mL/min Final   Comment: (NOTE) The eGFR has been calculated using the CKD EPI equation. This calculation has not been validated in all clinical situations. eGFR's persistently <60 mL/min signify possible Chronic Kidney Disease.   . Anion gap 02/17/2016 13  5 - 15 Final  . WBC 02/17/2016 6.5  4.0 - 10.5 K/uL Final  . RBC 02/17/2016 4.95  4.22 - 5.81 MIL/uL Final  . Hemoglobin 02/17/2016 13.1  13.0 - 17.0 g/dL Final  . HCT 02/17/2016 41.2  39.0 - 52.0 % Final  . MCV 02/17/2016 83.2  78.0 - 100.0 fL Final  . MCH 02/17/2016 26.5  26.0 - 34.0 pg Final  . MCHC 02/17/2016 31.8  30.0 - 36.0 g/dL Final  . RDW 02/17/2016 14.8  11.5 - 15.5 % Final  . Platelets 02/17/2016 323  150 - 400 K/uL Final  .  Neutrophils Relative % 02/17/2016 73   Final  . Neutro Abs 02/17/2016 4.8  1.7 - 7.7 K/uL Final  . Lymphocytes Relative 02/17/2016 18   Final  . Lymphs Abs 02/17/2016 1.2  0.7 - 4.0 K/uL Final  . Monocytes Relative 02/17/2016 8   Final  . Monocytes Absolute 02/17/2016 0.5  0.1 - 1.0 K/uL Final  . Eosinophils Relative 02/17/2016 2   Final  . Eosinophils Absolute 02/17/2016 0.1  0.0 - 0.7 K/uL Final  . Basophils Relative 02/17/2016 1   Final  . Basophils Absolute 02/17/2016 0.0  0.0 - 0.1 K/uL Final  . Color, Urine 02/17/2016 YELLOW  YELLOW Final  . APPearance 02/17/2016 CLEAR  CLEAR Final  . Specific Gravity, Urine 02/17/2016 1.015  1.005 - 1.030 Final  . pH 02/17/2016 5.5  5.0 - 8.0 Final  . Glucose, UA 02/17/2016 NEGATIVE  NEGATIVE mg/dL Final  . Hgb urine dipstick 02/17/2016 NEGATIVE  NEGATIVE Final  . Bilirubin Urine 02/17/2016 NEGATIVE  NEGATIVE Final  . Ketones, ur 02/17/2016 15* NEGATIVE mg/dL Final  . Protein, ur 02/17/2016 NEGATIVE  NEGATIVE mg/dL Final  . Nitrite 02/17/2016 NEGATIVE  NEGATIVE Final  . Leukocytes, UA 02/17/2016 NEGATIVE  NEGATIVE Final   Comment: MICROSCOPIC NOT DONE ON URINES WITH NEGATIVE PROTEIN, BLOOD, LEUKOCYTES, NITRITE, OR GLUCOSE <1000 mg/dL. Performed at [facility]   Admission on 02/11/2016, Discharged on 02/12/2016  Component Date Value Ref Range Status  . Sodium 02/11/2016 142  135 - 145 mmol/L Final  . Potassium 02/11/2016 3.6  3.5 - 5.1 mmol/L Final  . Chloride 02/11/2016 102  101 - 111 mmol/L Final  . CO2 02/11/2016 26  22 - 32 mmol/L Final  . Glucose, Bld 02/11/2016 91  65 - 99 mg/dL Final  . BUN 02/11/2016 17  6 - 20 mg/dL Final  . Creatinine, Ser 02/11/2016 0.82  0.61 - 1.24 mg/dL Final  . Calcium 02/11/2016 9.2  8.9 - 10.3 mg/dL Final  . GFR calc non Af Amer 02/11/2016 >60  >60 mL/min Final  . GFR calc Af Amer 02/11/2016 >60  >60 mL/min Final   Comment: (NOTE) The eGFR has been calculated using the CKD EPI equation. This  calculation has not been validated in all clinical situations. eGFR's persistently <60 mL/min signify possible Chronic Kidney Disease.   . Anion gap 02/11/2016 14  5 - 15 Final  . WBC 02/11/2016 6.9  4.0 - 10.5 K/uL Final  . RBC 02/11/2016 5.01  4.22 - 5.81 MIL/uL Final  . Hemoglobin 02/11/2016 13.2  13.0 - 17.0  g/dL Final  . HCT 02/11/2016 40.9  39.0 - 52.0 % Final  . MCV 02/11/2016 81.6  78.0 - 100.0 fL Final  . MCH 02/11/2016 26.3  26.0 - 34.0 pg Final  . MCHC 02/11/2016 32.3  30.0 - 36.0 g/dL Final  . RDW 02/11/2016 14.9  11.5 - 15.5 % Final  . Platelets 02/11/2016 282  150 - 400 K/uL Final  . Neutrophils Relative % 02/11/2016 75   Final  . Neutro Abs 02/11/2016 5.3  1.7 - 7.7 K/uL Final  . Lymphocytes Relative 02/11/2016 16   Final  . Lymphs Abs 02/11/2016 1.1  0.7 - 4.0 K/uL Final  . Monocytes Relative 02/11/2016 6   Final  . Monocytes Absolute 02/11/2016 0.4  0.1 - 1.0 K/uL Final  . Eosinophils Relative 02/11/2016 3   Final  . Eosinophils Absolute 02/11/2016 0.2  0.0 - 0.7 K/uL Final  . Basophils Relative 02/11/2016 0   Final  . Basophils Absolute 02/11/2016 0.0  0.0 - 0.1 K/uL Final  . Color, Urine 02/11/2016 YELLOW  YELLOW Final  . APPearance 02/11/2016 CLEAR  CLEAR Final  . Specific Gravity, Urine 02/11/2016 1.029  1.005 - 1.030 Final  . pH 02/11/2016 5.5  5.0 - 8.0 Final  . Glucose, UA 02/11/2016 NEGATIVE  NEGATIVE mg/dL Final  . Hgb urine dipstick 02/11/2016 NEGATIVE  NEGATIVE Final  . Bilirubin Urine 02/11/2016 SMALL* NEGATIVE Final  . Ketones, ur 02/11/2016 15* NEGATIVE mg/dL Final  . Protein, ur 02/11/2016 NEGATIVE  NEGATIVE mg/dL Final  . Nitrite 02/11/2016 NEGATIVE  NEGATIVE Final  . Leukocytes, UA 02/11/2016 NEGATIVE  NEGATIVE Final   MICROSCOPIC NOT DONE ON URINES WITH NEGATIVE PROTEIN, BLOOD, LEUKOCYTES, NITRITE, OR GLUCOSE <1000 mg/dL.  Marland Kitchen RMSF IgG 02/12/2016 Positive* Negative Final  . RMSF IgM 02/12/2016 0.25  0.00 - 0.89 index Final   Comment: (NOTE)                                  Negative        <0.90                                 Equivocal 0.90 - 1.10                                 Positive        >1.10 Performed At: [facility] [place], [place] [id number] Lindon Romp MD MQ:[id number]   . B burgdorferi Ab IgG+IgM 02/12/2016 <0.91  0.00 - 0.90 ISR Final   Comment: (NOTE)                                Negative         <0.91                                Equivocal  0.91 - 1.09                                Positive         >1.09 Performed At: [facility] [place], [place]  [id number] Lindon Romp MD WU:[id number]   . RMSF, IGG, IFA 02/12/2016 <1:64  Neg <1:64 Final   Comment: (NOTE)                             Negative           <1:64                             Positive            1:64                             Recent/Active      >1:64 Titers of 1:64 are suggestive of past or possible current infection. Titers >1:64 are suggestive of recent or active infection. Approximately 9% of specimens positive by EIA screen are negative by IFA. Performed At: [facility] [place], [place] [id number] Lindon Romp MD VH:[id number]   [place] on 01/11/2016  Component Date Value Ref Range Status  . Hemoglobin 12/28/2015 11.0* 13.5 - 17.5 g/dL Final  . HCT 12/28/2015 37* 41 - 53 % Final  . Platelets 12/28/2015 279  150 - 399 K/L Final  . WBC 12/28/2015 5.2   Final  . Glucose 12/28/2015 84   Final  . BUN 12/28/2015 20  4 - 21 mg/dL Final  . Creatinine 12/28/2015 0.9  0.6 - 1.3 mg/dL Final  . Potassium 12/28/2015 4.6  3.4 - 5.3 mmol/L Final  . Sodium 12/28/2015 144  137 - 147 mmol/L Final    Dg Lumbar Spine Complete  03/20/2016  CLINICAL DATA:  Low back pain post fall, has had low back pain for 30 years EXAM: LUMBAR SPINE - COMPLETE 4+ VIEW COMPARISON:  MRI lumbar spine 02/14/2015 FINDINGS: Five non-rib-bearing lumbar vertebra. Bones demineralized.  Multilevel disc space narrowing throughout lumbar spine. Scattered vacuum phenomenon. Irregularity at superior endplate L5 unchanged. Facet degenerative changes at lower lumbar spine. Mild retrolisthesis L3-L4 unchanged. Vertebral body heights maintained without fracture or additional subluxation. No bone destruction or spondylolysis. SI joints preserved. 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 No acute abnormalities. Electronically Signed   By: Lavonia Dana M.D.   On: 03/20/2016 12:25   Dg Pelvis 1-2 Views  03/20/2016  CLINICAL DATA:  Back pain post fall, has had low back pain for 30 years EXAM: PELVIS - 1-2 VIEW COMPARISON:  None FINDINGS: Hip joints preserved and symmetric. Slight irregularity of the LEFT SI joint versus RIGHT, though patient is minimally rotated; subtle asymmetric LEFT sacroiliitis not excluded. Degenerative disc disease changes at visualized lower lumbar spine. No fracture, dislocation or bone destruction. IMPRESSION: No acute bony injuries identified. Degenerative disc disease changes lower lumbar spine. Cannot exclude subtle LEFT sacroiliitis. Electronically Signed   By: Lavonia Dana M.D.   On: 03/20/2016 12:26   Ct Head Wo Contrast  03/20/2016  CLINICAL DATA:  Multiple falls, no loss consciousness EXAM: CT HEAD WITHOUT CONTRAST TECHNIQUE: Contiguous axial images were obtained from the base of the skull through the vertex without intravenous contrast. COMPARISON:  02/11/2016 FINDINGS: There is no evidence of mass effect, midline shift, or extra-axial fluid collections. There is no evidence of a space-occupying lesion or intracranial hemorrhage. There is no evidence of a cortical-based area of acute infarction. There is generalized cerebral  atrophy. There is periventricular white matter low attenuation likely secondary to microangiopathy. The ventricles and sulci are appropriate for the patient's age. The basal cisterns are patent.  Visualized portions of the orbits are unremarkable. The visualized portions of the paranasal sinuses and mastoid air cells are unremarkable. Cerebrovascular atherosclerotic calcifications are noted. The osseous structures are unremarkable. IMPRESSION: 1. No acute intracranial pathology. 2. Chronic microvascular disease and cerebral atrophy. Electronically Signed   By: Kathreen Devoid   On: 03/20/2016 13:25   Mr Lumbar Spine Wo Contrast  03/21/2016  CLINICAL DATA:  Low back pain and right leg pain with weakness. EXAM: MRI LUMBAR SPINE WITHOUT CONTRAST TECHNIQUE: Multiplanar, multisequence MR imaging of the lumbar spine was performed. No intravenous contrast was administered. COMPARISON:  02/14/2015 FINDINGS: Segmentation: Standard. Alignment: Chronic L4-5 grade 1 anterolisthesis and L3-4 mild retrolisthesis. Vertebrae: No signal abnormality to suggest fracture, discitis, or mass. Conus: Extends to the L1 level and appears normal. Paraspinal and retroperitoneal structures: Negative. Disc levels: T12- L1: Negative L1-L2: Disc: Narrowing with endplate ridging. Chronic small left paracentral disc extrusion with upward migration Facets: Facet arthropathy with bony and ligamentous overgrowth Canal: Progressive stenosis that is moderate, with cauda equina crowding. Stenosis is most notable in the subarticular recesses. The left paracentral disc protrusion could focally impinge on the exiting L1 nerve. Foramina: Bilateral narrowing, worse on the right where it is moderate. L2-L3: Disc: Advanced disc narrowing with endplate ridging. Left paracentral disc extrusion with superior migration that is chronic Facets: Arthropathy with bony and ligamentous overgrowth Canal: High-grade narrowing with near complete effacement of CSF, progressed. Foramina: Right more than left stenosis with impingement on the right L2 nerve. L3-L4: Disc: Narrowing and bulging. Facets: Advanced arthropathy with marked ligamentum flavum thickening and  buckling Canal: Progressive stenosis with minimal residual CSF present. Foramina: Bilateral stenosis with complete effacement of perineural fat, stable. L4-L5: Disc: Narrowing and biforaminal predominant bulging or protrusion. Facets: Advanced arthropathy.  Changes of laminotomy on the right Canal: Distorted thecal sac with bilateral subarticular recess effacement. Foramina: Bilateral stenosis, severe on the left and moderate on the right. L5-S1: Disc: Bulging. Facets: Advanced arthropathy with joint effusions Canal: Bilateral subarticular recess narrowing without definitive impingement Foramina: Moderate to advanced right stenosis. IMPRESSION: 1. 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 Underlying degenerative changes are described above, including chronic extrusions at L1-2 and L2-3. 2. Stable bilateral subarticular recess stenosis and potential L5 impingement at the postoperative L4-5 level. 3. Left foraminal stenosis mainly at L3-4 and L4-5, severe at L4-5. 4. Moderate right foraminal stenosis from L1-2 to L5-S1. Electronically Signed   By: Monte Fantasia M.D.   On: 03/21/2016 11:29     Assessment/Plan    This encounter was created in error - please disregard.

## 2016-03-28 ENCOUNTER — Non-Acute Institutional Stay (SKILLED_NURSING_FACILITY): Payer: Medicare Other | Admitting: Adult Health

## 2016-03-28 ENCOUNTER — Encounter: Payer: Self-pay | Admitting: Adult Health

## 2016-03-28 DIAGNOSIS — K219 Gastro-esophageal reflux disease without esophagitis: Secondary | ICD-10-CM | POA: Diagnosis not present

## 2016-03-28 DIAGNOSIS — R5381 Other malaise: Secondary | ICD-10-CM | POA: Diagnosis not present

## 2016-03-28 DIAGNOSIS — I10 Essential (primary) hypertension: Secondary | ICD-10-CM

## 2016-03-28 DIAGNOSIS — K922 Gastrointestinal hemorrhage, unspecified: Secondary | ICD-10-CM | POA: Diagnosis not present

## 2016-03-28 DIAGNOSIS — M4807 Spinal stenosis, lumbosacral region: Secondary | ICD-10-CM

## 2016-03-28 DIAGNOSIS — M545 Low back pain: Secondary | ICD-10-CM | POA: Diagnosis not present

## 2016-03-28 DIAGNOSIS — M5459 Other low back pain: Secondary | ICD-10-CM

## 2016-03-28 NOTE — Progress Notes (Signed)
Patient ID: Evan Charles, male   DOB: [birth year], 70 y.o.   MRN: [id number]   Location:  [facility] Room Number: 106-A Place of Service:  SNF (31)   CODE STATUS: Full Code  No Known Allergies  Chief Complaint  Patient presents with  . Hospitalization Follow-up    Hospital Follow up    HPI:  He has been hospitalized for acute lower back pain. He was unable to care for himself or his pets at home. He was unable to stand. He was treated conservatively in the hospital with therapy and steroid taper. He is here for short term rehab; with his goal to return back home. At this time I do not feel as though he will be able to return back home.    Past Medical History  Diagnosis Date  . Back pain   . Colitis   . Prostate cancer ([place])   . GERD (gastroesophageal reflux disease)   . Neuropathy ([place])     " MY HANDS "  . Hypertension   . Chronic mental illness     Past Surgical History  Procedure Laterality Date  . Prostatectomy    . Laminectomy    . Knee arthroscopy      Social History   Social History  . Marital Status: Single    Spouse Name: N/A  . Number of Children: N/A  . Years of Education: N/A   Occupational History  . Not on file.   Social History Main Topics  . Smoking status: Former Smoker    Types: Cigarettes  . Smokeless tobacco: Never Used  . Alcohol Use: No  . Drug Use: No  . Sexual Activity: Not on file   Other Topics Concern  . Not on file   Social History Narrative   Family History  Problem Relation Age of Onset  . Cancer Brother   . Cancer Maternal Grandmother       VITAL SIGNS BP 128/70 mmHg  Pulse 71  Temp(Src) 98.1 F (36.7 C) (Oral)  Resp 18  Ht 5\' 9"  (1.753 m)  Wt 184 lb 6 oz (83.632 kg)  BMI 27.22 kg/m2  SpO2 93%  Patient's Medications  New Prescriptions   No medications on file  Previous Medications   ACETAMINOPHEN (TYLENOL) 325 MG TABLET    Take 2 tablets (650 mg total) by mouth  every 6 (six) hours as needed for mild pain (or Fever >/= 101).   BISACODYL (DULCOLAX) 10 MG SUPPOSITORY    Place 1 suppository (10 mg total) rectally daily as needed for moderate constipation.   CHOLECALCIFEROL (VITAMIN D) 1000 UNITS TABLET    Take 1 tablet (1,000 Units total) by mouth daily.   DOCUSATE SODIUM (COLACE) 100 MG CAPSULE    Take 1 capsule (100 mg total) by mouth 2 (two) times daily.   HYDROCHLOROTHIAZIDE (MICROZIDE) 12.5 MG CAPSULE    Take 1 capsule (12.5 mg total) by mouth daily.   HYDROCODONE-ACETAMINOPHEN (NORCO/VICODIN) 5-325 MG TABLET    Take 1-2 tablets by mouth every 6 (six) hours as needed for severe pain.   MELOXICAM (MOBIC) 7.5 MG TABLET    Take 1 tablet (7.5 mg total) by mouth daily.   METHOCARBAMOL (ROBAXIN) 500 MG TABLET    Take 1 tablet (500 mg total) by mouth 3 (three) times daily.   PANTOPRAZOLE (PROTONIX) 40 MG TABLET    Take 1 tablet (40 mg total) by mouth 2 (two) times daily.   PREDNISONE (DELTASONE) 10  MG TABLET    Prednisone dosing: Take  Prednisone 40mg  (4 tabs) x2 days, then taper to 30mg  (3 tabs) x 3 days, then 20mg  (2 tabs) x 3days, then 10mg  (1 tab) x 3days, then OFF.   TRAMADOL (ULTRAM) 50 MG TABLET    Take 50 mg by mouth every 6 (six) hours as needed.   TRAZODONE (DESYREL) 50 MG TABLET    Take 1 tablet (50 mg total) by mouth at bedtime.  Modified Medications   No medications on file  Discontinued Medications   No medications on file     SIGNIFICANT DIAGNOSTIC EXAMS  12-11-15: chest x-ray: Chronic left basilar atelectasis versus scarring and left hemidiaphragm elevation. Mild cardiomegaly without acute CHF or pneumonia.  Stable exam.  12-11-15: ct of head: No significant abnormality identified.  03-20-16: pelvic x-ray; No acute bony injuries identified. Degenerative disc disease changes lower lumbar spine. Cannot exclude subtle LEFT sacroiliitis.  03-20-16: lumbar spine x-ray:Degenerative disc and facet disease changes of the lumbar spine similar to  02/14/2015. No acute abnormalities.   03-20-16: ct of head: 1. No acute intracranial pathology. 2. Chronic microvascular disease and cerebral atrophy.  03-21-16: mri of lumbar spine: 1. 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 Underlying degenerative changes are described above, including chronic extrusions at L1-2 and L2-3. 2. Stable bilateral subarticular recess stenosis and potential L5 impingement at the postoperative L4-5 level. 3. Left foraminal stenosis mainly at L3-4 and L4-5, severe at L4-5. 4. Moderate right foraminal stenosis from L1-2 to L5-S1.     LABS REVIEWED:   12-11-15: wbc 5.1; hgb 11.7; hct 37.0; mcv 81.0; plt 334; glucose 98; bun 16; creat 0.80; k+ 3.6; na++141; liver normal albumin 4.1; BNP 15.4; lactic acid 1.46; RPR: nr  12-28-15: wbc 5.2; hgb 11.0; hct 36.9; mcv 82.4; plt 279; glucose 84; bun 20.4; creat 0.92'; k+ 4.6; na++144  03-20-16: wbc 6.7 hgb 12.0; hct 39.2; mcv 83.;9 plt 290; glucose 99; bun 21; creat 1.04; k+ 4.1; na++144; liver normal albumin 3.9      Review of Systems  Constitutional: Negative for malaise/fatigue.  Respiratory: Negative for cough and shortness of breath.   Cardiovascular: Positive for leg swelling. Negative for chest pain and palpitations.  Gastrointestinal: Negative for heartburn, abdominal pain and constipation.  Musculoskeletal: chronic back pain for "30 years".    Skin: Negative.   Neurological: Negative for headaches.  Psychiatric/Behavioral: Negative for depression. The patient is not nervous/anxious.      Physical Exam  Constitutional: He appears well-developed and well-nourished. No distress.  Eyes: Conjunctivae are normal.  Neck: Neck supple. No JVD present. No thyromegaly present.  Cardiovascular: Normal rate, regular rhythm and intact distal pulses.   Respiratory: Effort normal and breath sounds normal. No respiratory distress. He has no wheezes.  GI: Soft. Bowel sounds are normal.  He exhibits no distension. There is no tenderness.  Musculoskeletal: He exhibits edema.  Able to move all extremities  Bilateral lower extremity edema: trace amount   Lymphadenopathy:    He has no cervical adenopathy.  Neurological: He is alert.  Skin: Skin is warm and dry. He is not diaphoretic.  Psychiatric: He has a normal mood and affect.      ASSESSMENT/ PLAN:  1. Hypertension: will continue hctz 12.5 mg daily   2. Chronic low back pain: has lumbar spine stenosis: is taking mobic 7.5 mg daily; robaxin 500 mg three times daily; is on prednisone taper; has vicodin 5/325 mg 1 or 2  tabs every 6 hours as needed and has ultram every 6 hours as needed   3. Gerd: has history of GI bleed; will continue protonix 40 mg twice daily   4. Hallucinations: he had stopped his medications at home; is presently not on medications is at baseline. Will not make changes will monitor   5. Constipation: will continue colace twice daily   6. Insomnia: will continue trazodone 50 mg nightly   7. Physical deconditioning: will continue therapy as directed to improve upon his level of independence with his adl's and mobility     Will check cbc; cmp next week   Time spent with patient 50   minutes >50% time spent counseling; reviewing medical record; tests; labs; and developing future plan of care            Ok Edwards NP [facility]  Contact [phone] Monday through Friday 8am- 5pm  After hours call [phone]

## 2016-04-03 ENCOUNTER — Non-Acute Institutional Stay (SKILLED_NURSING_FACILITY): Payer: Medicare Other | Admitting: Internal Medicine

## 2016-04-03 ENCOUNTER — Encounter: Payer: Self-pay | Admitting: Internal Medicine

## 2016-04-03 DIAGNOSIS — R5381 Other malaise: Secondary | ICD-10-CM | POA: Diagnosis not present

## 2016-04-03 DIAGNOSIS — M545 Low back pain: Secondary | ICD-10-CM | POA: Diagnosis not present

## 2016-04-03 DIAGNOSIS — R2681 Unsteadiness on feet: Secondary | ICD-10-CM | POA: Diagnosis not present

## 2016-04-03 DIAGNOSIS — M4807 Spinal stenosis, lumbosacral region: Secondary | ICD-10-CM

## 2016-04-03 DIAGNOSIS — R600 Localized edema: Secondary | ICD-10-CM | POA: Diagnosis not present

## 2016-04-03 DIAGNOSIS — K219 Gastro-esophageal reflux disease without esophagitis: Secondary | ICD-10-CM | POA: Diagnosis not present

## 2016-04-03 DIAGNOSIS — M5459 Other low back pain: Secondary | ICD-10-CM

## 2016-04-03 DIAGNOSIS — F29 Unspecified psychosis not due to a substance or known physiological condition: Secondary | ICD-10-CM

## 2016-04-03 DIAGNOSIS — I10 Essential (primary) hypertension: Secondary | ICD-10-CM

## 2016-04-03 NOTE — Progress Notes (Signed)
HISTORY AND PHYSICAL   DATE: 04/03/16  Location:  [place] Room Number: [id number] Place of Service: SNF [phone])   Extended Emergency Contact Information Primary Emergency Contact: Anderson,Edith Address: [address]          [place] [address] [place] Phone: [phone] Relation: Spouse Secondary Emergency Contact: Simpson,Michelle  [place] Phone: [phone] Relation: Daughter  Advanced Directive information Does patient have an advance directive?: No, Would patient like information on creating an advanced directive?: No - patient declined information FULL CODE Chief Complaint  Patient presents with  . New Admit To SNF    HPI:  70 yo male seen today as a new admission into SNF following hospital stay for severe lumbar spinal stenosis, difficulty walking, immobility, HTN, intractable back pain, psychosis with hallucinations. He had a MRI L spine that revealed severe spinal stenosis at L1-L2 through L3-L4 that has progressed since April 2016. Decision made to treat conservatively. Cr 1.04;albumin 3.9; Hgb 11.1. He presents to SNF for short term rehab.  He has no c/o today. He is doing ok with therapy. PT reports he has difficulty ambulating without assistance due to LE weakness. He is a fall risk. Appetite ok. sleeping well. He is a poor historian due to psych d/o. Hx obtained from chart.  Hypertension/LE edema - stable on HCTZ 12.5 mg daily   Chronic low back pain due to lumbar spine stenosis/neuropathy - pain controlled on mobic 7.5 mg daily; robaxin 500 mg three times daily;  prednisone taper; has vicodin 5/325 mg 1 or 2 tabs every 6 hours as needed and has ultram every 6 hours as needed   GERD - has history of GI bleed; takes protonix 40 mg twice daily   Hallucinations/psychosis - he had stopped his medications at home; is presently not on medications is at baseline. Denies hallucinations today.  He is a [place] pt  Constipation - stable on colace twice daily   Insomnia - stable on trazodone 50 mg nightly   Hx prostate CA - s/p prostatectomy. No urinary c/o today  Past Medical History  Diagnosis Date  . Back pain   . Colitis   . Prostate cancer ([place])   . GERD (gastroesophageal reflux disease)   . Neuropathy ([place])     " MY HANDS "  . Hypertension   . Chronic mental illness     Past Surgical History  Procedure Laterality Date  . Prostatectomy    . Laminectomy    . Knee arthroscopy      No care team member to display  Social History   Social History  . Marital Status: Single    Spouse Name: N/A  . Number of Children: N/A  . Years of Education: N/A   Occupational History  . Not on file.   Social History Main Topics  . Smoking status: Former Smoker    Types: Cigarettes  . Smokeless tobacco: Never Used  . Alcohol Use: No  . Drug Use: No  . Sexual Activity: Not on file   Other Topics Concern  . Not on file   Social History Narrative     reports that he has quit smoking. His smoking use included Cigarettes. He has never used smokeless tobacco. He reports that he does not drink alcohol or use illicit drugs.  Family History  Problem Relation Age of Onset  . Cancer Brother   . Cancer Maternal Grandmother  No family status information on file.    Immunization History  Administered Date(s) Administered  . DTaP 06/09/2001  . PPD Test 03/22/2016  . Tdap 08/14/2013    No Known Allergies  Medications: Patient's Medications  New Prescriptions   No medications on file  Previous Medications   ACETAMINOPHEN (TYLENOL) 325 MG TABLET    Take 2 tablets (650 mg total) by mouth every 6 (six) hours as needed for mild pain (or Fever >/= 101).   BISACODYL (DULCOLAX) 10 MG SUPPOSITORY    Place 1 suppository (10 mg total) rectally daily as needed for moderate constipation.   CHOLECALCIFEROL (VITAMIN D) 1000 UNITS TABLET    Take 1 tablet (1,000 Units total) by  mouth daily.   DOCUSATE SODIUM (COLACE) 100 MG CAPSULE    Take 1 capsule (100 mg total) by mouth 2 (two) times daily.   HYDROCHLOROTHIAZIDE (MICROZIDE) 12.5 MG CAPSULE    Take 1 capsule (12.5 mg total) by mouth daily.   HYDROCODONE-ACETAMINOPHEN (NORCO/VICODIN) 5-325 MG TABLET    Take 1-2 tablets by mouth every 6 (six) hours as needed for severe pain.   MELOXICAM (MOBIC) 7.5 MG TABLET    Take 1 tablet (7.5 mg total) by mouth daily.   METHOCARBAMOL (ROBAXIN) 500 MG TABLET    Take 1 tablet (500 mg total) by mouth 3 (three) times daily.   PANTOPRAZOLE (PROTONIX) 40 MG TABLET    Take 1 tablet (40 mg total) by mouth 2 (two) times daily.   PREDNISONE (DELTASONE) 10 MG TABLET    Prednisone dosing: Take  Prednisone 30m (4 tabs) x2 days, then taper to 342m(3 tabs) x 3 days, then 203m2 tabs) x 3days, then 72m63m tab) x 3days, then OFF.   TRAMADOL (ULTRAM) 50 MG TABLET    Take 50 mg by mouth every 6 (six) hours as needed.   TRAZODONE (DESYREL) 50 MG TABLET    Take 1 tablet (50 mg total) by mouth at bedtime.  Modified Medications   No medications on file  Discontinued Medications   No medications on file    Review of Systems  Unable to perform ROS: Psychiatric disorder    Filed Vitals:   04/03/16 0912  BP: 130/72  Pulse: 88  Temp: 98 F (36.7 C)  TempSrc: Oral  Resp: 18  Height: 5' 9"  (1.753 m)  Weight: 184 lb (83.462 kg)   Body mass index is 27.16 kg/(m^2).  Physical Exam  Constitutional: He is oriented to person, place, and time. He appears well-developed and well-nourished.  Sitting in w/c in NAD, looks uncomfortable  HENT:  Mouth/Throat: Oropharynx is clear and moist.  MMM  Eyes: Pupils are equal, round, and reactive to light. No scleral icterus.  Neck: Neck supple. Carotid bruit is not present. No thyromegaly present.  Cardiovascular: Normal rate, regular rhythm, normal heart sounds and intact distal pulses.  Exam reveals no gallop and no friction rub.   No murmur heard. +1  pitting LE edema b/l. No calf TTP  Pulmonary/Chest: Effort normal and breath sounds normal. He has no wheezes. He has no rales. He exhibits no tenderness.  Abdominal: Soft. Bowel sounds are normal. He exhibits no distension, no abdominal bruit, no pulsatile midline mass and no mass. There is no tenderness. There is no rebound and no guarding.  Musculoskeletal: He exhibits edema and tenderness.  Lymphadenopathy:    He has no cervical adenopathy.  Neurological: He is alert and oriented to person, place, and time. He has normal reflexes.  Strength 4/5 in RLE  Skin: Skin is warm and dry. No rash noted.  Psychiatric: He has a normal mood and affect. His behavior is normal. Thought content normal.     Labs reviewed: Erroneous Encounter on 03/27/2016  Component Date Value Ref Range Status  . WBC 03/20/2016 5.8   Final  . Glucose 03/20/2016 99   Final  . BUN 03/20/2016 21  4 - 21 mg/dL Final  . Creatinine 03/20/2016 1.0  0.6 - 1.3 mg/dL Final  . Sodium 03/20/2016 144  137 - 147 mmol/L Final  . Bilirubin, Total 03/20/2016 1.2   Final  Admission on 03/20/2016, Discharged on 03/22/2016  Component Date Value Ref Range Status  . WBC 03/20/2016 6.7  4.0 - 10.5 K/uL Final  . RBC 03/20/2016 4.67  4.22 - 5.81 MIL/uL Final  . Hemoglobin 03/20/2016 12.0* 13.0 - 17.0 g/dL Final  . HCT 03/20/2016 39.2  39.0 - 52.0 % Final  . MCV 03/20/2016 83.9  78.0 - 100.0 fL Final  . MCH 03/20/2016 25.7* 26.0 - 34.0 pg Final  . MCHC 03/20/2016 30.6  30.0 - 36.0 g/dL Final  . RDW 03/20/2016 14.9  11.5 - 15.5 % Final  . Platelets 03/20/2016 290  150 - 400 K/uL Final  . Neutrophils Relative % 03/20/2016 75   Final  . Neutro Abs 03/20/2016 5.0  1.7 - 7.7 K/uL Final  . Lymphocytes Relative 03/20/2016 17   Final  . Lymphs Abs 03/20/2016 1.1  0.7 - 4.0 K/uL Final  . Monocytes Relative 03/20/2016 5   Final  . Monocytes Absolute 03/20/2016 0.4  0.1 - 1.0 K/uL Final  . Eosinophils Relative 03/20/2016 2   Final  .  Eosinophils Absolute 03/20/2016 0.1  0.0 - 0.7 K/uL Final  . Basophils Relative 03/20/2016 1   Final  . Basophils Absolute 03/20/2016 0.0  0.0 - 0.1 K/uL Final  . Sodium 03/20/2016 144  135 - 145 mmol/L Final  . Potassium 03/20/2016 4.1  3.5 - 5.1 mmol/L Final  . Chloride 03/20/2016 105  101 - 111 mmol/L Final  . CO2 03/20/2016 26  22 - 32 mmol/L Final  . Glucose, Bld 03/20/2016 99  65 - 99 mg/dL Final  . BUN 03/20/2016 21* 6 - 20 mg/dL Final  . Creatinine, Ser 03/20/2016 1.04  0.61 - 1.24 mg/dL Final  . Calcium 03/20/2016 9.3  8.9 - 10.3 mg/dL Final  . Total Protein 03/20/2016 6.9  6.5 - 8.1 g/dL Final  . Albumin 03/20/2016 3.9  3.5 - 5.0 g/dL Final  . AST 03/20/2016 29  15 - 41 U/L Final  . ALT 03/20/2016 10* 17 - 63 U/L Final  . Alkaline Phosphatase 03/20/2016 55  38 - 126 U/L Final  . Total Bilirubin 03/20/2016 1.2  0.3 - 1.2 mg/dL Final  . GFR calc non Af Amer 03/20/2016 >60  >60 mL/min Final  . GFR calc Af Amer 03/20/2016 >60  >60 mL/min Final   Comment: (NOTE) The eGFR has been calculated using the CKD EPI equation. This calculation has not been validated in all clinical situations. eGFR's persistently <60 mL/min signify possible Chronic Kidney Disease.   . Anion gap 03/20/2016 13  5 - 15 Final  . Color, Urine 03/20/2016 YELLOW  YELLOW Final  . APPearance 03/20/2016 CLEAR  CLEAR Final  . Specific Gravity, Urine 03/20/2016 1.023  1.005 - 1.030 Final  . pH 03/20/2016 6.0  5.0 - 8.0 Final  . Glucose, UA 03/20/2016 NEGATIVE  NEGATIVE mg/dL Final  .  Hgb urine dipstick 03/20/2016 NEGATIVE  NEGATIVE Final  . Bilirubin Urine 03/20/2016 NEGATIVE  NEGATIVE Final  . Ketones, ur 03/20/2016 15* NEGATIVE mg/dL Final  . Protein, ur 03/20/2016 NEGATIVE  NEGATIVE mg/dL Final  . Nitrite 03/20/2016 NEGATIVE  NEGATIVE Final  . Leukocytes, UA 03/20/2016 NEGATIVE  NEGATIVE Final   MICROSCOPIC NOT DONE ON URINES WITH NEGATIVE PROTEIN, BLOOD, LEUKOCYTES, NITRITE, OR GLUCOSE <1000 mg/dL.  . WBC  03/20/2016 5.8  4.0 - 10.5 K/uL Final  . RBC 03/20/2016 4.31  4.22 - 5.81 MIL/uL Final  . Hemoglobin 03/20/2016 11.1* 13.0 - 17.0 g/dL Final  . HCT 03/20/2016 36.1* 39.0 - 52.0 % Final  . MCV 03/20/2016 83.8  78.0 - 100.0 fL Final  . MCH 03/20/2016 25.8* 26.0 - 34.0 pg Final  . MCHC 03/20/2016 30.7  30.0 - 36.0 g/dL Final  . RDW 03/20/2016 15.0  11.5 - 15.5 % Final  . Platelets 03/20/2016 261  150 - 400 K/uL Final  . Creatinine, Ser 03/20/2016 0.99  0.61 - 1.24 mg/dL Final  . GFR calc non Af Amer 03/20/2016 >60  >60 mL/min Final  . GFR calc Af Amer 03/20/2016 >60  >60 mL/min Final   Comment: (NOTE) The eGFR has been calculated using the CKD EPI equation. This calculation has not been validated in all clinical situations. eGFR's persistently <60 mL/min signify possible Chronic Kidney Disease.   . Glucose-Capillary 03/21/2016 114* 65 - 99 mg/dL Final  . Glucose-Capillary 03/21/2016 108* 65 - 99 mg/dL Final  . Glucose-Capillary 03/21/2016 143* 65 - 99 mg/dL Final  . Glucose-Capillary 03/21/2016 141* 65 - 99 mg/dL Final  . Glucose-Capillary 03/22/2016 94  65 - 99 mg/dL Final  . Glucose-Capillary 03/22/2016 153* 65 - 99 mg/dL Final  Admission on 02/17/2016, Discharged on 02/18/2016  Component Date Value Ref Range Status  . Sodium 02/17/2016 140  135 - 145 mmol/L Final  . Potassium 02/17/2016 3.5  3.5 - 5.1 mmol/L Final  . Chloride 02/17/2016 101  101 - 111 mmol/L Final  . CO2 02/17/2016 26  22 - 32 mmol/L Final  . Glucose, Bld 02/17/2016 78  65 - 99 mg/dL Final  . BUN 02/17/2016 16  6 - 20 mg/dL Final  . Creatinine, Ser 02/17/2016 0.86  0.61 - 1.24 mg/dL Final  . Calcium 02/17/2016 9.3  8.9 - 10.3 mg/dL Final  . Total Protein 02/17/2016 7.7  6.5 - 8.1 g/dL Final  . Albumin 02/17/2016 4.5  3.5 - 5.0 g/dL Final  . AST 02/17/2016 57* 15 - 41 U/L Final  . ALT 02/17/2016 34  17 - 63 U/L Final  . Alkaline Phosphatase 02/17/2016 58  38 - 126 U/L Final  . Total Bilirubin 02/17/2016 0.8  0.3  - 1.2 mg/dL Final  . GFR calc non Af Amer 02/17/2016 >60  >60 mL/min Final  . GFR calc Af Amer 02/17/2016 >60  >60 mL/min Final   Comment: (NOTE) The eGFR has been calculated using the CKD EPI equation. This calculation has not been validated in all clinical situations. eGFR's persistently <60 mL/min signify possible Chronic Kidney Disease.   . Anion gap 02/17/2016 13  5 - 15 Final  . WBC 02/17/2016 6.5  4.0 - 10.5 K/uL Final  . RBC 02/17/2016 4.95  4.22 - 5.81 MIL/uL Final  . Hemoglobin 02/17/2016 13.1  13.0 - 17.0 g/dL Final  . HCT 02/17/2016 41.2  39.0 - 52.0 % Final  . MCV 02/17/2016 83.2  78.0 - 100.0 fL Final  . MCH  02/17/2016 26.5  26.0 - 34.0 pg Final  . MCHC 02/17/2016 31.8  30.0 - 36.0 g/dL Final  . RDW 02/17/2016 14.8  11.5 - 15.5 % Final  . Platelets 02/17/2016 323  150 - 400 K/uL Final  . Neutrophils Relative % 02/17/2016 73   Final  . Neutro Abs 02/17/2016 4.8  1.7 - 7.7 K/uL Final  . Lymphocytes Relative 02/17/2016 18   Final  . Lymphs Abs 02/17/2016 1.2  0.7 - 4.0 K/uL Final  . Monocytes Relative 02/17/2016 8   Final  . Monocytes Absolute 02/17/2016 0.5  0.1 - 1.0 K/uL Final  . Eosinophils Relative 02/17/2016 2   Final  . Eosinophils Absolute 02/17/2016 0.1  0.0 - 0.7 K/uL Final  . Basophils Relative 02/17/2016 1   Final  . Basophils Absolute 02/17/2016 0.0  0.0 - 0.1 K/uL Final  . Color, Urine 02/17/2016 YELLOW  YELLOW Final  . APPearance 02/17/2016 CLEAR  CLEAR Final  . Specific Gravity, Urine 02/17/2016 1.015  1.005 - 1.030 Final  . pH 02/17/2016 5.5  5.0 - 8.0 Final  . Glucose, UA 02/17/2016 NEGATIVE  NEGATIVE mg/dL Final  . Hgb urine dipstick 02/17/2016 NEGATIVE  NEGATIVE Final  . Bilirubin Urine 02/17/2016 NEGATIVE  NEGATIVE Final  . Ketones, ur 02/17/2016 15* NEGATIVE mg/dL Final  . Protein, ur 02/17/2016 NEGATIVE  NEGATIVE mg/dL Final  . Nitrite 02/17/2016 NEGATIVE  NEGATIVE Final  . Leukocytes, UA 02/17/2016 NEGATIVE  NEGATIVE Final   Comment:  MICROSCOPIC NOT DONE ON URINES WITH NEGATIVE PROTEIN, BLOOD, LEUKOCYTES, NITRITE, OR GLUCOSE <1000 mg/dL. Performed at [facility]   Admission on 02/11/2016, Discharged on 02/12/2016  Component Date Value Ref Range Status  . Sodium 02/11/2016 142  135 - 145 mmol/L Final  . Potassium 02/11/2016 3.6  3.5 - 5.1 mmol/L Final  . Chloride 02/11/2016 102  101 - 111 mmol/L Final  . CO2 02/11/2016 26  22 - 32 mmol/L Final  . Glucose, Bld 02/11/2016 91  65 - 99 mg/dL Final  . BUN 02/11/2016 17  6 - 20 mg/dL Final  . Creatinine, Ser 02/11/2016 0.82  0.61 - 1.24 mg/dL Final  . Calcium 02/11/2016 9.2  8.9 - 10.3 mg/dL Final  . GFR calc non Af Amer 02/11/2016 >60  >60 mL/min Final  . GFR calc Af Amer 02/11/2016 >60  >60 mL/min Final   Comment: (NOTE) The eGFR has been calculated using the CKD EPI equation. This calculation has not been validated in all clinical situations. eGFR's persistently <60 mL/min signify possible Chronic Kidney Disease.   . Anion gap 02/11/2016 14  5 - 15 Final  . WBC 02/11/2016 6.9  4.0 - 10.5 K/uL Final  . RBC 02/11/2016 5.01  4.22 - 5.81 MIL/uL Final  . Hemoglobin 02/11/2016 13.2  13.0 - 17.0 g/dL Final  . HCT 02/11/2016 40.9  39.0 - 52.0 % Final  . MCV 02/11/2016 81.6  78.0 - 100.0 fL Final  . MCH 02/11/2016 26.3  26.0 - 34.0 pg Final  . MCHC 02/11/2016 32.3  30.0 - 36.0 g/dL Final  . RDW 02/11/2016 14.9  11.5 - 15.5 % Final  . Platelets 02/11/2016 282  150 - 400 K/uL Final  . Neutrophils Relative % 02/11/2016 75   Final  . Neutro Abs 02/11/2016 5.3  1.7 - 7.7 K/uL Final  . Lymphocytes Relative 02/11/2016 16   Final  . Lymphs Abs 02/11/2016 1.1  0.7 - 4.0 K/uL Final  . Monocytes Relative 02/11/2016 6  Final  . Monocytes Absolute 02/11/2016 0.4  0.1 - 1.0 K/uL Final  . Eosinophils Relative 02/11/2016 3   Final  . Eosinophils Absolute 02/11/2016 0.2  0.0 - 0.7 K/uL Final  . Basophils Relative 02/11/2016 0   Final  . Basophils Absolute 02/11/2016 0.0  0.0 -  0.1 K/uL Final  . Color, Urine 02/11/2016 YELLOW  YELLOW Final  . APPearance 02/11/2016 CLEAR  CLEAR Final  . Specific Gravity, Urine 02/11/2016 1.029  1.005 - 1.030 Final  . pH 02/11/2016 5.5  5.0 - 8.0 Final  . Glucose, UA 02/11/2016 NEGATIVE  NEGATIVE mg/dL Final  . Hgb urine dipstick 02/11/2016 NEGATIVE  NEGATIVE Final  . Bilirubin Urine 02/11/2016 SMALL* NEGATIVE Final  . Ketones, ur 02/11/2016 15* NEGATIVE mg/dL Final  . Protein, ur 02/11/2016 NEGATIVE  NEGATIVE mg/dL Final  . Nitrite 02/11/2016 NEGATIVE  NEGATIVE Final  . Leukocytes, UA 02/11/2016 NEGATIVE  NEGATIVE Final   MICROSCOPIC NOT DONE ON URINES WITH NEGATIVE PROTEIN, BLOOD, LEUKOCYTES, NITRITE, OR GLUCOSE <1000 mg/dL.  Marland Kitchen RMSF IgG 02/12/2016 Positive* Negative Final  . RMSF IgM 02/12/2016 0.25  0.00 - 0.89 index Final   Comment: (NOTE)                                 Negative        <0.90                                 Equivocal 0.90 - 1.10                                 Positive        >1.10 Performed At: [facility] [place], [place] [id number] Lindon Romp MD [id number]   . B burgdorferi Ab IgG+IgM 02/12/2016 <0.91  0.00 - 0.90 ISR Final   Comment: (NOTE)                                Negative         <0.91                                Equivocal  0.91 - 1.09                                Positive         >1.09 Performed At: [facility] [place], [place] [id number] Lindon Romp MD [id number]   . RMSF, IGG, IFA 02/12/2016 <1:64  Neg <1:64 Final   Comment: (NOTE)                             Negative           <1:64                             Positive            1:64  Recent/Active      >1:64 Titers of 1:64 are suggestive of past or possible current infection. Titers >1:64 are suggestive of recent or active infection. Approximately 9% of specimens positive by EIA screen are negative by IFA. Performed At: [facility] [place], [place] [id number] Lindon Romp MD [id number]   [place] on 01/11/2016  Component Date Value Ref Range Status  . Hemoglobin 12/28/2015 11.0* 13.5 - 17.5 g/dL Final  . HCT 12/28/2015 37* 41 - 53 % Final  . Platelets 12/28/2015 279  150 - 399 K/L Final  . WBC 12/28/2015 5.2   Final  . Glucose 12/28/2015 84   Final  . BUN 12/28/2015 20  4 - 21 mg/dL Final  . Creatinine 12/28/2015 0.9  0.6 - 1.3 mg/dL Final  . Potassium 12/28/2015 4.6  3.4 - 5.3 mmol/L Final  . Sodium 12/28/2015 144  137 - 147 mmol/L Final    Dg Lumbar Spine Complete  03/20/2016  CLINICAL DATA:  Low back pain post fall, has had low back pain for 30 years EXAM: LUMBAR SPINE - COMPLETE 4+ VIEW COMPARISON:  MRI lumbar spine 02/14/2015 FINDINGS: Five non-rib-bearing lumbar vertebra. Bones demineralized. Multilevel disc space narrowing throughout lumbar spine. Scattered vacuum phenomenon. Irregularity at superior endplate L5 unchanged. Facet degenerative changes at lower lumbar spine. Mild retrolisthesis L3-L4 unchanged. Vertebral body heights maintained without fracture or additional subluxation. No bone destruction or spondylolysis. SI joints preserved. 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 No acute abnormalities. Electronically Signed   By: Lavonia Dana M.D.   On: 03/20/2016 12:25   Dg Pelvis 1-2 Views  03/20/2016  CLINICAL DATA:  Back pain post fall, has had low back pain for 30 years EXAM: PELVIS - 1-2 VIEW COMPARISON:  None FINDINGS: Hip joints preserved and symmetric. Slight irregularity of the LEFT SI joint versus RIGHT, though patient is minimally rotated; subtle asymmetric LEFT sacroiliitis not excluded. Degenerative disc disease changes at visualized lower lumbar spine. No fracture, dislocation or bone destruction. IMPRESSION: No acute bony injuries identified. Degenerative disc disease changes lower lumbar spine. Cannot  exclude subtle LEFT sacroiliitis. Electronically Signed   By: Lavonia Dana M.D.   On: 03/20/2016 12:26   Ct Head Wo Contrast  03/20/2016  CLINICAL DATA:  Multiple falls, no loss consciousness EXAM: CT HEAD WITHOUT CONTRAST TECHNIQUE: Contiguous axial images were obtained from the base of the skull through the vertex without intravenous contrast. COMPARISON:  02/11/2016 FINDINGS: There is no evidence of mass effect, midline shift, or extra-axial fluid collections. There is no evidence of a space-occupying lesion or intracranial hemorrhage. There is no evidence of a cortical-based area of acute infarction. There is generalized cerebral atrophy. There is periventricular white matter low attenuation likely secondary to microangiopathy. The ventricles and sulci are appropriate for the patient's age. The basal cisterns are patent. Visualized portions of the orbits are unremarkable. The visualized portions of the paranasal sinuses and mastoid air cells are unremarkable. Cerebrovascular atherosclerotic calcifications are noted. The osseous structures are unremarkable. IMPRESSION: 1. No acute intracranial pathology. 2. Chronic microvascular disease and cerebral atrophy. Electronically Signed   By: Kathreen Devoid   On: 03/20/2016 13:25   Mr Lumbar Spine Wo Contrast  03/21/2016  CLINICAL DATA:  Low back pain and right leg pain with weakness. EXAM: MRI LUMBAR SPINE WITHOUT CONTRAST TECHNIQUE: Multiplanar, multisequence MR imaging of the lumbar spine was performed. No intravenous contrast was administered. COMPARISON:  02/14/2015  FINDINGS: Segmentation: Standard. Alignment: Chronic L4-5 grade 1 anterolisthesis and L3-4 mild retrolisthesis. Vertebrae: No signal abnormality to suggest fracture, discitis, or mass. Conus: Extends to the L1 level and appears normal. Paraspinal and retroperitoneal structures: Negative. Disc levels: T12- L1: Negative L1-L2: Disc: Narrowing with endplate ridging. Chronic small left paracentral disc  extrusion with upward migration Facets: Facet arthropathy with bony and ligamentous overgrowth Canal: Progressive stenosis that is moderate, with cauda equina crowding. Stenosis is most notable in the subarticular recesses. The left paracentral disc protrusion could focally impinge on the exiting L1 nerve. Foramina: Bilateral narrowing, worse on the right where it is moderate. L2-L3: Disc: Advanced disc narrowing with endplate ridging. Left paracentral disc extrusion with superior migration that is chronic Facets: Arthropathy with bony and ligamentous overgrowth Canal: High-grade narrowing with near complete effacement of CSF, progressed. Foramina: Right more than left stenosis with impingement on the right L2 nerve. L3-L4: Disc: Narrowing and bulging. Facets: Advanced arthropathy with marked ligamentum flavum thickening and buckling Canal: Progressive stenosis with minimal residual CSF present. Foramina: Bilateral stenosis with complete effacement of perineural fat, stable. L4-L5: Disc: Narrowing and biforaminal predominant bulging or protrusion. Facets: Advanced arthropathy.  Changes of laminotomy on the right Canal: Distorted thecal sac with bilateral subarticular recess effacement. Foramina: Bilateral stenosis, severe on the left and moderate on the right. L5-S1: Disc: Bulging. Facets: Advanced arthropathy with joint effusions Canal: Bilateral subarticular recess narrowing without definitive impingement Foramina: Moderate to advanced right stenosis. IMPRESSION: 1. 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 Underlying degenerative changes are described above, including chronic extrusions at L1-2 and L2-3. 2. Stable bilateral subarticular recess stenosis and potential L5 impingement at the postoperative L4-5 level. 3. Left foraminal stenosis mainly at L3-4 and L4-5, severe at L4-5. 4. Moderate right foraminal stenosis from L1-2 to L5-S1. Electronically Signed   By: Monte Fantasia M.D.   On: 03/21/2016 11:29     Assessment/Plan   ICD-9-CM ICD-10-CM   1. Intractable low back pain - improving 724.2 M54.5   2. Stenosis of lumbosacral spine 724.02 M48.07   3. Physical deconditioning 799.3 R53.81   4. 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 GERD without esophagitis 530.81 K21.9   6. Bilateral lower extremity edema 782.3 R60.0   7. Essential hypertension 401.9 I10   8. Unsteady gait 781.2 R26.81     CBC and BMP ordered  Cont current meds as ordered  PT/OT/ST as ordered  F/u with specialists as scheduled  GOAL: short term rehab and d/c home when medically appropriate. Communicated with pt and nursing.  Will follow  Ginger Leeth S. Perlie Gold  [facility] [address] [place], [place] [address] [phone] Cell (Monday-Friday 8 AM - 5 PM) [phone] After 5 PM and follow prompts

## 2016-04-17 ENCOUNTER — Encounter: Payer: Self-pay | Admitting: Internal Medicine

## 2016-04-17 ENCOUNTER — Non-Acute Institutional Stay (SKILLED_NURSING_FACILITY): Payer: Medicare Other | Admitting: Internal Medicine

## 2016-04-17 DIAGNOSIS — F29 Unspecified psychosis not due to a substance or known physiological condition: Secondary | ICD-10-CM

## 2016-04-17 DIAGNOSIS — R2681 Unsteadiness on feet: Secondary | ICD-10-CM

## 2016-04-17 DIAGNOSIS — K219 Gastro-esophageal reflux disease without esophagitis: Secondary | ICD-10-CM | POA: Diagnosis not present

## 2016-04-17 DIAGNOSIS — R600 Localized edema: Secondary | ICD-10-CM | POA: Diagnosis not present

## 2016-04-17 DIAGNOSIS — F489 Nonpsychotic mental disorder, unspecified: Secondary | ICD-10-CM | POA: Diagnosis not present

## 2016-04-17 DIAGNOSIS — I10 Essential (primary) hypertension: Secondary | ICD-10-CM | POA: Diagnosis not present

## 2016-04-17 DIAGNOSIS — M4807 Spinal stenosis, lumbosacral region: Secondary | ICD-10-CM

## 2016-04-17 DIAGNOSIS — F5105 Insomnia due to other mental disorder: Secondary | ICD-10-CM

## 2016-04-17 NOTE — Progress Notes (Signed)
DATE: 04/17/16  Location:  [facility] Room Number: 106 Place of Service: SNF [phone])   Extended Emergency Contact Information Primary Emergency Contact: Anderson,Edith Address: [address]          [place] [address] [place] Phone: [phone] Relation: Spouse Secondary Emergency Contact: Simpson,Michelle  [place] Phone: [phone] Relation: Daughter  Advanced Directive information Does patient have an advance directive?: No, Would patient like information on creating an advanced directive?: No - patient declined information FULL CODE Chief Complaint  Patient presents with  . Discharge Note    HPI:  70 yo male seen today for d/c from SNF following short term rehab for severe lumbar spinal stenosis, difficulty walking, immobility, HTN, intractable back pain, psychosis with hallucinations. He tolerated tx and is medically stable to d/c home with [facility] and will require PT/OT/social worker, CNA. No DME req'd as he has rolling walker, 3-in-1 commode and w/c at home. No nursing issues. No falls. He is a poor historian due to psychosis. hx obtained from chart  BRIEF SUMMARY: while in hospital, he had a MRI L spine that revealed severe spinal stenosis at L1-L2 through L3-L4 that has progressed since April 2016. Decision made to treat conservatively. Cr 1.04;albumin 3.9; Hgb 11.1.  Hypertension/LE edema - stable on HCTZ 12.5 mg daily   Chronic low back pain due to lumbar spine stenosis/neuropathy - pain controlled on mobic 7.5 mg daily; robaxin 500 mg three times daily;  prednisone taper; has vicodin 5/325 mg 1 or 2 tabs every 6 hours as needed and has ultram every 6 hours as needed   GERD - has history of GI bleed; takes protonix 40 mg twice daily   Hallucinations/psychosis - he had stopped his medications at home; is presently not on medications is at baseline. Denies hallucinations today. He is a  [place] pt  Constipation - stable on colace twice daily   Insomnia - stable on trazodone 50 mg nightly   Hx prostate CA - s/p prostatectomy. No urinary c/o today  Past Medical History  Diagnosis Date  . Back pain   . Colitis   . Prostate cancer ([place])   . GERD (gastroesophageal reflux disease)   . Neuropathy ([place])     " MY HANDS "  . Hypertension   . Chronic mental illness     Past Surgical History  Procedure Laterality Date  . Prostatectomy    . Laminectomy    . Knee arthroscopy      No care team member to display  Social History   Social History  . Marital Status: Single    Spouse Name: N/A  . Number of Children: N/A  . Years of Education: N/A   Occupational History  . Not on file.   Social History Main Topics  . Smoking status: Former Smoker    Types: Cigarettes  . Smokeless tobacco: Never Used  . Alcohol Use: No  . Drug Use: No  . Sexual Activity: Not on file   Other Topics Concern  . Not on file   Social History Narrative     reports that he has quit smoking. His smoking use included Cigarettes. He has never used smokeless tobacco. He reports that he does not drink alcohol or use illicit drugs.  Family History  Problem Relation Age of Onset  . Cancer Brother   . Cancer Maternal Grandmother    No family status information on file.  Immunization History  Administered Date(s) Administered  . DTaP 06/09/2001  . PPD Test 03/22/2016  . Tdap 08/14/2013    No Known Allergies  Medications: Patient's Medications  New Prescriptions   No medications on file  Previous Medications   ACETAMINOPHEN (TYLENOL) 325 MG TABLET    Take 2 tablets (650 mg total) by mouth every 6 (six) hours as needed for mild pain (or Fever >/= 101).   BISACODYL (DULCOLAX) 10 MG SUPPOSITORY    Place 1 suppository (10 mg total) rectally daily as needed for moderate constipation.   CHOLECALCIFEROL (VITAMIN D) 1000 UNITS TABLET    Take 1 tablet (1,000 Units total) by mouth daily.    DOCUSATE SODIUM (COLACE) 100 MG CAPSULE    Take 1 capsule (100 mg total) by mouth 2 (two) times daily.   HYDROCHLOROTHIAZIDE (MICROZIDE) 12.5 MG CAPSULE    Take 1 capsule (12.5 mg total) by mouth daily.   HYDROCODONE-ACETAMINOPHEN (NORCO/VICODIN) 5-325 MG TABLET    Take 1-2 tablets by mouth every 6 (six) hours as needed for severe pain.   MELOXICAM (MOBIC) 7.5 MG TABLET    Take 1 tablet (7.5 mg total) by mouth daily.   METHOCARBAMOL (ROBAXIN) 500 MG TABLET    Take 1 tablet (500 mg total) by mouth 3 (three) times daily.   PANTOPRAZOLE (PROTONIX) 40 MG TABLET    Take 1 tablet (40 mg total) by mouth 2 (two) times daily.   TRAMADOL (ULTRAM) 50 MG TABLET    Take 50 mg by mouth every 6 (six) hours as needed.   TRAZODONE (DESYREL) 50 MG TABLET    Take 1 tablet (50 mg total) by mouth at bedtime.  Modified Medications   No medications on file  Discontinued Medications   PREDNISONE (DELTASONE) 10 MG TABLET    Prednisone dosing: Take  Prednisone 90m (4 tabs) x2 days, then taper to 322m(3 tabs) x 3 days, then 2076m2 tabs) x 3days, then 59m104m tab) x 3days, then OFF.    Review of Systems  Unable to perform ROS: Psychiatric disorder    Filed Vitals:   04/17/16 1107  BP: 140/90  Pulse: 80  Temp: 97.5 F (36.4 C)  TempSrc: Oral  Resp: 18  Height: 5' 11"  (1.803 m)  Weight: 184 lb 3.2 oz (83.553 kg)   Body mass index is 25.7 kg/(m^2).  Physical Exam  Constitutional: He appears well-developed and well-nourished.  Neurological: He is alert.  Psychiatric: He has a normal mood and affect. His behavior is normal.     Labs reviewed: Erroneous Encounter on 03/27/2016  Component Date Value Ref Range Status  . WBC 03/20/2016 5.8   Final  . Glucose 03/20/2016 99   Final  . BUN 03/20/2016 21  4 - 21 mg/dL Final  . Creatinine 03/20/2016 1.0  0.6 - 1.3 mg/dL Final  . Sodium 03/20/2016 144  137 - 147 mmol/L Final  . Bilirubin, Total 03/20/2016 1.2   Final  Admission on 03/20/2016, Discharged on  03/22/2016  Component Date Value Ref Range Status  . WBC 03/20/2016 6.7  4.0 - 10.5 K/uL Final  . RBC 03/20/2016 4.67  4.22 - 5.81 MIL/uL Final  . Hemoglobin 03/20/2016 12.0* 13.0 - 17.0 g/dL Final  . HCT 03/20/2016 39.2  39.0 - 52.0 % Final  . MCV 03/20/2016 83.9  78.0 - 100.0 fL Final  . MCH 03/20/2016 25.7* 26.0 - 34.0 pg Final  . MCHC 03/20/2016 30.6  30.0 - 36.0 g/dL Final  . RDW 03/20/2016  14.9  11.5 - 15.5 % Final  . Platelets 03/20/2016 290  150 - 400 K/uL Final  . Neutrophils Relative % 03/20/2016 75   Final  . Neutro Abs 03/20/2016 5.0  1.7 - 7.7 K/uL Final  . Lymphocytes Relative 03/20/2016 17   Final  . Lymphs Abs 03/20/2016 1.1  0.7 - 4.0 K/uL Final  . Monocytes Relative 03/20/2016 5   Final  . Monocytes Absolute 03/20/2016 0.4  0.1 - 1.0 K/uL Final  . Eosinophils Relative 03/20/2016 2   Final  . Eosinophils Absolute 03/20/2016 0.1  0.0 - 0.7 K/uL Final  . Basophils Relative 03/20/2016 1   Final  . Basophils Absolute 03/20/2016 0.0  0.0 - 0.1 K/uL Final  . Sodium 03/20/2016 144  135 - 145 mmol/L Final  . Potassium 03/20/2016 4.1  3.5 - 5.1 mmol/L Final  . Chloride 03/20/2016 105  101 - 111 mmol/L Final  . CO2 03/20/2016 26  22 - 32 mmol/L Final  . Glucose, Bld 03/20/2016 99  65 - 99 mg/dL Final  . BUN 03/20/2016 21* 6 - 20 mg/dL Final  . Creatinine, Ser 03/20/2016 1.04  0.61 - 1.24 mg/dL Final  . Calcium 03/20/2016 9.3  8.9 - 10.3 mg/dL Final  . Total Protein 03/20/2016 6.9  6.5 - 8.1 g/dL Final  . Albumin 03/20/2016 3.9  3.5 - 5.0 g/dL Final  . AST 03/20/2016 29  15 - 41 U/L Final  . ALT 03/20/2016 10* 17 - 63 U/L Final  . Alkaline Phosphatase 03/20/2016 55  38 - 126 U/L Final  . Total Bilirubin 03/20/2016 1.2  0.3 - 1.2 mg/dL Final  . GFR calc non Af Amer 03/20/2016 >60  >60 mL/min Final  . GFR calc Af Amer 03/20/2016 >60  >60 mL/min Final   Comment: (NOTE) The eGFR has been calculated using the CKD EPI equation. This calculation has not been validated in all  clinical situations. eGFR's persistently <60 mL/min signify possible Chronic Kidney Disease.   . Anion gap 03/20/2016 13  5 - 15 Final  . Color, Urine 03/20/2016 YELLOW  YELLOW Final  . APPearance 03/20/2016 CLEAR  CLEAR Final  . Specific Gravity, Urine 03/20/2016 1.023  1.005 - 1.030 Final  . pH 03/20/2016 6.0  5.0 - 8.0 Final  . Glucose, UA 03/20/2016 NEGATIVE  NEGATIVE mg/dL Final  . Hgb urine dipstick 03/20/2016 NEGATIVE  NEGATIVE Final  . Bilirubin Urine 03/20/2016 NEGATIVE  NEGATIVE Final  . Ketones, ur 03/20/2016 15* NEGATIVE mg/dL Final  . Protein, ur 03/20/2016 NEGATIVE  NEGATIVE mg/dL Final  . Nitrite 03/20/2016 NEGATIVE  NEGATIVE Final  . Leukocytes, UA 03/20/2016 NEGATIVE  NEGATIVE Final   MICROSCOPIC NOT DONE ON URINES WITH NEGATIVE PROTEIN, BLOOD, LEUKOCYTES, NITRITE, OR GLUCOSE <1000 mg/dL.  . WBC 03/20/2016 5.8  4.0 - 10.5 K/uL Final  . RBC 03/20/2016 4.31  4.22 - 5.81 MIL/uL Final  . Hemoglobin 03/20/2016 11.1* 13.0 - 17.0 g/dL Final  . HCT 03/20/2016 36.1* 39.0 - 52.0 % Final  . MCV 03/20/2016 83.8  78.0 - 100.0 fL Final  . MCH 03/20/2016 25.8* 26.0 - 34.0 pg Final  . MCHC 03/20/2016 30.7  30.0 - 36.0 g/dL Final  . RDW 03/20/2016 15.0  11.5 - 15.5 % Final  . Platelets 03/20/2016 261  150 - 400 K/uL Final  . Creatinine, Ser 03/20/2016 0.99  0.61 - 1.24 mg/dL Final  . GFR calc non Af Amer 03/20/2016 >60  >60 mL/min Final  . GFR calc Af Wyvonnia Lora  03/20/2016 >60  >60 mL/min Final   Comment: (NOTE) The eGFR has been calculated using the CKD EPI equation. This calculation has not been validated in all clinical situations. eGFR's persistently <60 mL/min signify possible Chronic Kidney Disease.   . Glucose-Capillary 03/21/2016 114* 65 - 99 mg/dL Final  . Glucose-Capillary 03/21/2016 108* 65 - 99 mg/dL Final  . Glucose-Capillary 03/21/2016 143* 65 - 99 mg/dL Final  . Glucose-Capillary 03/21/2016 141* 65 - 99 mg/dL Final  . Glucose-Capillary 03/22/2016 94  65 - 99 mg/dL  Final  . Glucose-Capillary 03/22/2016 153* 65 - 99 mg/dL Final  Admission on 02/17/2016, Discharged on 02/18/2016  Component Date Value Ref Range Status  . Sodium 02/17/2016 140  135 - 145 mmol/L Final  . Potassium 02/17/2016 3.5  3.5 - 5.1 mmol/L Final  . Chloride 02/17/2016 101  101 - 111 mmol/L Final  . CO2 02/17/2016 26  22 - 32 mmol/L Final  . Glucose, Bld 02/17/2016 78  65 - 99 mg/dL Final  . BUN 02/17/2016 16  6 - 20 mg/dL Final  . Creatinine, Ser 02/17/2016 0.86  0.61 - 1.24 mg/dL Final  . Calcium 02/17/2016 9.3  8.9 - 10.3 mg/dL Final  . Total Protein 02/17/2016 7.7  6.5 - 8.1 g/dL Final  . Albumin 02/17/2016 4.5  3.5 - 5.0 g/dL Final  . AST 02/17/2016 57* 15 - 41 U/L Final  . ALT 02/17/2016 34  17 - 63 U/L Final  . Alkaline Phosphatase 02/17/2016 58  38 - 126 U/L Final  . Total Bilirubin 02/17/2016 0.8  0.3 - 1.2 mg/dL Final  . GFR calc non Af Amer 02/17/2016 >60  >60 mL/min Final  . GFR calc Af Amer 02/17/2016 >60  >60 mL/min Final   Comment: (NOTE) The eGFR has been calculated using the CKD EPI equation. This calculation has not been validated in all clinical situations. eGFR's persistently <60 mL/min signify possible Chronic Kidney Disease.   . Anion gap 02/17/2016 13  5 - 15 Final  . WBC 02/17/2016 6.5  4.0 - 10.5 K/uL Final  . RBC 02/17/2016 4.95  4.22 - 5.81 MIL/uL Final  . Hemoglobin 02/17/2016 13.1  13.0 - 17.0 g/dL Final  . HCT 02/17/2016 41.2  39.0 - 52.0 % Final  . MCV 02/17/2016 83.2  78.0 - 100.0 fL Final  . MCH 02/17/2016 26.5  26.0 - 34.0 pg Final  . MCHC 02/17/2016 31.8  30.0 - 36.0 g/dL Final  . RDW 02/17/2016 14.8  11.5 - 15.5 % Final  . Platelets 02/17/2016 323  150 - 400 K/uL Final  . Neutrophils Relative % 02/17/2016 73   Final  . Neutro Abs 02/17/2016 4.8  1.7 - 7.7 K/uL Final  . Lymphocytes Relative 02/17/2016 18   Final  . Lymphs Abs 02/17/2016 1.2  0.7 - 4.0 K/uL Final  . Monocytes Relative 02/17/2016 8   Final  . Monocytes Absolute  02/17/2016 0.5  0.1 - 1.0 K/uL Final  . Eosinophils Relative 02/17/2016 2   Final  . Eosinophils Absolute 02/17/2016 0.1  0.0 - 0.7 K/uL Final  . Basophils Relative 02/17/2016 1   Final  . Basophils Absolute 02/17/2016 0.0  0.0 - 0.1 K/uL Final  . Color, Urine 02/17/2016 YELLOW  YELLOW Final  . APPearance 02/17/2016 CLEAR  CLEAR Final  . Specific Gravity, Urine 02/17/2016 1.015  1.005 - 1.030 Final  . pH 02/17/2016 5.5  5.0 - 8.0 Final  . Glucose, UA 02/17/2016 NEGATIVE  NEGATIVE mg/dL Final  .  Hgb urine dipstick 02/17/2016 NEGATIVE  NEGATIVE Final  . Bilirubin Urine 02/17/2016 NEGATIVE  NEGATIVE Final  . Ketones, ur 02/17/2016 15* NEGATIVE mg/dL Final  . Protein, ur 02/17/2016 NEGATIVE  NEGATIVE mg/dL Final  . Nitrite 02/17/2016 NEGATIVE  NEGATIVE Final  . Leukocytes, UA 02/17/2016 NEGATIVE  NEGATIVE Final   Comment: MICROSCOPIC NOT DONE ON URINES WITH NEGATIVE PROTEIN, BLOOD, LEUKOCYTES, NITRITE, OR GLUCOSE <1000 mg/dL. Performed at [facility]   Admission on 02/11/2016, Discharged on 02/12/2016  Component Date Value Ref Range Status  . Sodium 02/11/2016 142  135 - 145 mmol/L Final  . Potassium 02/11/2016 3.6  3.5 - 5.1 mmol/L Final  . Chloride 02/11/2016 102  101 - 111 mmol/L Final  . CO2 02/11/2016 26  22 - 32 mmol/L Final  . Glucose, Bld 02/11/2016 91  65 - 99 mg/dL Final  . BUN 02/11/2016 17  6 - 20 mg/dL Final  . Creatinine, Ser 02/11/2016 0.82  0.61 - 1.24 mg/dL Final  . Calcium 02/11/2016 9.2  8.9 - 10.3 mg/dL Final  . GFR calc non Af Amer 02/11/2016 >60  >60 mL/min Final  . GFR calc Af Amer 02/11/2016 >60  >60 mL/min Final   Comment: (NOTE) The eGFR has been calculated using the CKD EPI equation. This calculation has not been validated in all clinical situations. eGFR's persistently <60 mL/min signify possible Chronic Kidney Disease.   . Anion gap 02/11/2016 14  5 - 15 Final  . WBC 02/11/2016 6.9  4.0 - 10.5 K/uL Final  . RBC 02/11/2016 5.01  4.22 - 5.81  MIL/uL Final  . Hemoglobin 02/11/2016 13.2  13.0 - 17.0 g/dL Final  . HCT 02/11/2016 40.9  39.0 - 52.0 % Final  . MCV 02/11/2016 81.6  78.0 - 100.0 fL Final  . MCH 02/11/2016 26.3  26.0 - 34.0 pg Final  . MCHC 02/11/2016 32.3  30.0 - 36.0 g/dL Final  . RDW 02/11/2016 14.9  11.5 - 15.5 % Final  . Platelets 02/11/2016 282  150 - 400 K/uL Final  . Neutrophils Relative % 02/11/2016 75   Final  . Neutro Abs 02/11/2016 5.3  1.7 - 7.7 K/uL Final  . Lymphocytes Relative 02/11/2016 16   Final  . Lymphs Abs 02/11/2016 1.1  0.7 - 4.0 K/uL Final  . Monocytes Relative 02/11/2016 6   Final  . Monocytes Absolute 02/11/2016 0.4  0.1 - 1.0 K/uL Final  . Eosinophils Relative 02/11/2016 3   Final  . Eosinophils Absolute 02/11/2016 0.2  0.0 - 0.7 K/uL Final  . Basophils Relative 02/11/2016 0   Final  . Basophils Absolute 02/11/2016 0.0  0.0 - 0.1 K/uL Final  . Color, Urine 02/11/2016 YELLOW  YELLOW Final  . APPearance 02/11/2016 CLEAR  CLEAR Final  . Specific Gravity, Urine 02/11/2016 1.029  1.005 - 1.030 Final  . pH 02/11/2016 5.5  5.0 - 8.0 Final  . Glucose, UA 02/11/2016 NEGATIVE  NEGATIVE mg/dL Final  . Hgb urine dipstick 02/11/2016 NEGATIVE  NEGATIVE Final  . Bilirubin Urine 02/11/2016 SMALL* NEGATIVE Final  . Ketones, ur 02/11/2016 15* NEGATIVE mg/dL Final  . Protein, ur 02/11/2016 NEGATIVE  NEGATIVE mg/dL Final  . Nitrite 02/11/2016 NEGATIVE  NEGATIVE Final  . Leukocytes, UA 02/11/2016 NEGATIVE  NEGATIVE Final   MICROSCOPIC NOT DONE ON URINES WITH NEGATIVE PROTEIN, BLOOD, LEUKOCYTES, NITRITE, OR GLUCOSE <1000 mg/dL.  Marland Kitchen RMSF IgG 02/12/2016 Positive* Negative Final  . RMSF IgM 02/12/2016 0.25  0.00 - 0.89 index Final   Comment: (  NOTE)                                 Negative        <0.90                                 Equivocal 0.90 - 1.10                                 Positive        >1.10 Performed At: [facility] [place], [place] [id number] Lindon Romp MD  XF:[id number]   . B burgdorferi Ab IgG+IgM 02/12/2016 <0.91  0.00 - 0.90 ISR Final   Comment: (NOTE)                                Negative         <0.91                                Equivocal  0.91 - 1.09                                Positive         >1.09 Performed At: [facility] [place], [place] [id number] Lindon Romp MD FB:[id number]   . RMSF, IGG, IFA 02/12/2016 <1:64  Neg <1:64 Final   Comment: (NOTE)                             Negative           <1:64                             Positive            1:64                             Recent/Active      >1:64 Titers of 1:64 are suggestive of past or possible current infection. Titers >1:64 are suggestive of recent or active infection. Approximately 9% of specimens positive by EIA screen are negative by IFA. Performed At: [facility] [place], [place] [id number] Lindon Romp MD WE:[id number]     Dg Lumbar Spine Complete  03/20/2016  CLINICAL DATA:  Low back pain post fall, has had low back pain for 30 years EXAM: LUMBAR SPINE - COMPLETE 4+ VIEW COMPARISON:  MRI lumbar spine 02/14/2015 FINDINGS: Five non-rib-bearing lumbar vertebra. Bones demineralized. Multilevel disc space narrowing throughout lumbar spine. Scattered vacuum phenomenon. Irregularity at superior endplate L5 unchanged. Facet degenerative changes at lower lumbar spine. Mild retrolisthesis L3-L4 unchanged. Vertebral body heights maintained without fracture or additional subluxation. No bone destruction or spondylolysis. SI joints preserved. 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 No acute abnormalities. Electronically Signed   By: Lavonia Dana M.D.   On: 03/20/2016 12:25   Dg Pelvis 1-2 Views  03/20/2016  CLINICAL DATA:  Back pain post fall, has had low back pain for 30 years EXAM: PELVIS - 1-2 VIEW COMPARISON:  None FINDINGS: Hip joints preserved and  symmetric. Slight irregularity of the LEFT SI joint versus RIGHT, though patient is minimally rotated; subtle asymmetric LEFT sacroiliitis not excluded. Degenerative disc disease changes at visualized lower lumbar spine. No fracture, dislocation or bone destruction. IMPRESSION: No acute bony injuries identified. Degenerative disc disease changes lower lumbar spine. Cannot exclude subtle LEFT sacroiliitis. Electronically Signed   By: Lavonia Dana M.D.   On: 03/20/2016 12:26   Ct Head Wo Contrast  03/20/2016  CLINICAL DATA:  Multiple falls, no loss consciousness EXAM: CT HEAD WITHOUT CONTRAST TECHNIQUE: Contiguous axial images were obtained from the base of the skull through the vertex without intravenous contrast. COMPARISON:  02/11/2016 FINDINGS: There is no evidence of mass effect, midline shift, or extra-axial fluid collections. There is no evidence of a space-occupying lesion or intracranial hemorrhage. There is no evidence of a cortical-based area of acute infarction. There is generalized cerebral atrophy. There is periventricular white matter low attenuation likely secondary to microangiopathy. The ventricles and sulci are appropriate for the patient's age. The basal cisterns are patent. Visualized portions of the orbits are unremarkable. The visualized portions of the paranasal sinuses and mastoid air cells are unremarkable. Cerebrovascular atherosclerotic calcifications are noted. The osseous structures are unremarkable. IMPRESSION: 1. No acute intracranial pathology. 2. Chronic microvascular disease and cerebral atrophy. Electronically Signed   By: Kathreen Devoid   On: 03/20/2016 13:25   Mr Lumbar Spine Wo Contrast  03/21/2016  CLINICAL DATA:  Low back pain and right leg pain with weakness. EXAM: MRI LUMBAR SPINE WITHOUT CONTRAST TECHNIQUE: Multiplanar, multisequence MR imaging of the lumbar spine was performed. No intravenous contrast was administered. COMPARISON:  02/14/2015 FINDINGS: Segmentation:  Standard. Alignment: Chronic L4-5 grade 1 anterolisthesis and L3-4 mild retrolisthesis. Vertebrae: No signal abnormality to suggest fracture, discitis, or mass. Conus: Extends to the L1 level and appears normal. Paraspinal and retroperitoneal structures: Negative. Disc levels: T12- L1: Negative L1-L2: Disc: Narrowing with endplate ridging. Chronic small left paracentral disc extrusion with upward migration Facets: Facet arthropathy with bony and ligamentous overgrowth Canal: Progressive stenosis that is moderate, with cauda equina crowding. Stenosis is most notable in the subarticular recesses. The left paracentral disc protrusion could focally impinge on the exiting L1 nerve. Foramina: Bilateral narrowing, worse on the right where it is moderate. L2-L3: Disc: Advanced disc narrowing with endplate ridging. Left paracentral disc extrusion with superior migration that is chronic Facets: Arthropathy with bony and ligamentous overgrowth Canal: High-grade narrowing with near complete effacement of CSF, progressed. Foramina: Right more than left stenosis with impingement on the right L2 nerve. L3-L4: Disc: Narrowing and bulging. Facets: Advanced arthropathy with marked ligamentum flavum thickening and buckling Canal: Progressive stenosis with minimal residual CSF present. Foramina: Bilateral stenosis with complete effacement of perineural fat, stable. L4-L5: Disc: Narrowing and biforaminal predominant bulging or protrusion. Facets: Advanced arthropathy.  Changes of laminotomy on the right Canal: Distorted thecal sac with bilateral subarticular recess effacement. Foramina: Bilateral stenosis, severe on the left and moderate on the right. L5-S1: Disc: Bulging. Facets: Advanced arthropathy with joint effusions Canal: Bilateral subarticular recess narrowing without definitive impingement Foramina: Moderate to advanced right stenosis. IMPRESSION: 1. Moderate to advanced canal stenosis from L1-2 to L3-4, progressed since  02/14/2015. Underlying degenerative changes are described above, including chronic extrusions at L1-2 and L2-3. 2. Stable bilateral subarticular recess stenosis and potential L5 impingement  at the postoperative L4-5 level. 3. Left foraminal stenosis mainly at L3-4 and L4-5, severe at L4-5. 4. Moderate right foraminal stenosis from L1-2 to L5-S1. Electronically Signed   By: Monte Fantasia M.D.   On: 03/21/2016 11:29     Assessment/Plan   ICD-9-CM ICD-10-CM   1. Stenosis of lumbosacral spine 724.02 M48.07   2. Psychosis, unspecified psychosis type 298.9 F29   3. Essential hypertension 401.9 I10   4. Bilateral lower extremity edema 782.3 R60.0   5. GERD without esophagitis 530.81 K21.9   6. Insomnia related to another mental disorder 300.9 F48.9    327.02    7. Unsteady gait 781.2 R26.81      Patient is being discharged with home health services:  PT/OT/social worker, CNA  Patient is being discharged with the following durable medical equipment:  None- he already has rolling walker, 3-in-1 commode and w/c at home  Patient has been advised to f/u with their PCP in 1-2 weeks to bring them up to date on their rehab stay.  They were provided with a 30 day supply of scripts for prescription medications and refills must be obtained from their PCP.  TIME SPENT (MINUTES): [place]. Perlie Gold  [facility] [address] [place], [place] [address] [phone] Cell (Monday-Friday 8 AM - 5 PM) [phone] After 5 PM and follow prompts

## 2016-04-19 DIAGNOSIS — K219 Gastro-esophageal reflux disease without esophagitis: Secondary | ICD-10-CM | POA: Diagnosis not present

## 2016-04-19 DIAGNOSIS — R2689 Other abnormalities of gait and mobility: Secondary | ICD-10-CM | POA: Diagnosis not present

## 2016-04-19 DIAGNOSIS — I10 Essential (primary) hypertension: Secondary | ICD-10-CM | POA: Diagnosis not present

## 2016-04-19 DIAGNOSIS — M4726 Other spondylosis with radiculopathy, lumbar region: Secondary | ICD-10-CM | POA: Diagnosis not present

## 2016-04-19 DIAGNOSIS — Z9181 History of falling: Secondary | ICD-10-CM | POA: Diagnosis not present

## 2016-04-19 DIAGNOSIS — Z87891 Personal history of nicotine dependence: Secondary | ICD-10-CM | POA: Diagnosis not present

## 2016-04-19 DIAGNOSIS — M4806 Spinal stenosis, lumbar region: Secondary | ICD-10-CM | POA: Diagnosis not present

## 2016-04-20 DIAGNOSIS — I10 Essential (primary) hypertension: Secondary | ICD-10-CM | POA: Diagnosis not present

## 2016-04-20 DIAGNOSIS — M4726 Other spondylosis with radiculopathy, lumbar region: Secondary | ICD-10-CM | POA: Diagnosis not present

## 2016-04-20 DIAGNOSIS — R2689 Other abnormalities of gait and mobility: Secondary | ICD-10-CM | POA: Diagnosis not present

## 2016-04-20 DIAGNOSIS — K219 Gastro-esophageal reflux disease without esophagitis: Secondary | ICD-10-CM | POA: Diagnosis not present

## 2016-04-20 DIAGNOSIS — M4806 Spinal stenosis, lumbar region: Secondary | ICD-10-CM | POA: Diagnosis not present

## 2016-04-20 DIAGNOSIS — Z87891 Personal history of nicotine dependence: Secondary | ICD-10-CM | POA: Diagnosis not present

## 2016-04-20 DIAGNOSIS — Z9181 History of falling: Secondary | ICD-10-CM | POA: Diagnosis not present

## 2016-05-01 DIAGNOSIS — I10 Essential (primary) hypertension: Secondary | ICD-10-CM | POA: Diagnosis not present

## 2016-05-01 DIAGNOSIS — K219 Gastro-esophageal reflux disease without esophagitis: Secondary | ICD-10-CM | POA: Diagnosis not present

## 2016-05-01 DIAGNOSIS — M4806 Spinal stenosis, lumbar region: Secondary | ICD-10-CM | POA: Diagnosis not present

## 2016-05-01 DIAGNOSIS — M4726 Other spondylosis with radiculopathy, lumbar region: Secondary | ICD-10-CM | POA: Diagnosis not present

## 2016-05-01 DIAGNOSIS — R2689 Other abnormalities of gait and mobility: Secondary | ICD-10-CM | POA: Diagnosis not present

## 2016-05-01 DIAGNOSIS — Z9181 History of falling: Secondary | ICD-10-CM | POA: Diagnosis not present

## 2016-05-01 DIAGNOSIS — Z87891 Personal history of nicotine dependence: Secondary | ICD-10-CM | POA: Diagnosis not present

## 2016-05-03 DIAGNOSIS — R2689 Other abnormalities of gait and mobility: Secondary | ICD-10-CM | POA: Diagnosis not present

## 2016-05-03 DIAGNOSIS — Z9181 History of falling: Secondary | ICD-10-CM | POA: Diagnosis not present

## 2016-05-03 DIAGNOSIS — I10 Essential (primary) hypertension: Secondary | ICD-10-CM | POA: Diagnosis not present

## 2016-05-03 DIAGNOSIS — M4806 Spinal stenosis, lumbar region: Secondary | ICD-10-CM | POA: Diagnosis not present

## 2016-05-03 DIAGNOSIS — K219 Gastro-esophageal reflux disease without esophagitis: Secondary | ICD-10-CM | POA: Diagnosis not present

## 2016-05-03 DIAGNOSIS — M4726 Other spondylosis with radiculopathy, lumbar region: Secondary | ICD-10-CM | POA: Diagnosis not present

## 2016-05-03 DIAGNOSIS — Z87891 Personal history of nicotine dependence: Secondary | ICD-10-CM | POA: Diagnosis not present

## 2016-05-07 DIAGNOSIS — I10 Essential (primary) hypertension: Secondary | ICD-10-CM | POA: Diagnosis not present

## 2016-05-07 DIAGNOSIS — M4806 Spinal stenosis, lumbar region: Secondary | ICD-10-CM | POA: Diagnosis not present

## 2016-05-07 DIAGNOSIS — R2689 Other abnormalities of gait and mobility: Secondary | ICD-10-CM | POA: Diagnosis not present

## 2016-05-07 DIAGNOSIS — Z87891 Personal history of nicotine dependence: Secondary | ICD-10-CM | POA: Diagnosis not present

## 2016-05-07 DIAGNOSIS — Z9181 History of falling: Secondary | ICD-10-CM | POA: Diagnosis not present

## 2016-05-07 DIAGNOSIS — M4726 Other spondylosis with radiculopathy, lumbar region: Secondary | ICD-10-CM | POA: Diagnosis not present

## 2016-05-07 DIAGNOSIS — K219 Gastro-esophageal reflux disease without esophagitis: Secondary | ICD-10-CM | POA: Diagnosis not present

## 2016-05-08 DIAGNOSIS — Z9181 History of falling: Secondary | ICD-10-CM | POA: Diagnosis not present

## 2016-05-08 DIAGNOSIS — M4806 Spinal stenosis, lumbar region: Secondary | ICD-10-CM | POA: Diagnosis not present

## 2016-05-08 DIAGNOSIS — I10 Essential (primary) hypertension: Secondary | ICD-10-CM | POA: Diagnosis not present

## 2016-05-08 DIAGNOSIS — Z87891 Personal history of nicotine dependence: Secondary | ICD-10-CM | POA: Diagnosis not present

## 2016-05-08 DIAGNOSIS — K219 Gastro-esophageal reflux disease without esophagitis: Secondary | ICD-10-CM | POA: Diagnosis not present

## 2016-05-08 DIAGNOSIS — M4726 Other spondylosis with radiculopathy, lumbar region: Secondary | ICD-10-CM | POA: Diagnosis not present

## 2016-05-08 DIAGNOSIS — R2689 Other abnormalities of gait and mobility: Secondary | ICD-10-CM | POA: Diagnosis not present

## 2016-05-09 DIAGNOSIS — M4726 Other spondylosis with radiculopathy, lumbar region: Secondary | ICD-10-CM | POA: Diagnosis not present

## 2016-05-09 DIAGNOSIS — Z9181 History of falling: Secondary | ICD-10-CM | POA: Diagnosis not present

## 2016-05-09 DIAGNOSIS — Z87891 Personal history of nicotine dependence: Secondary | ICD-10-CM | POA: Diagnosis not present

## 2016-05-09 DIAGNOSIS — R2689 Other abnormalities of gait and mobility: Secondary | ICD-10-CM | POA: Diagnosis not present

## 2016-05-09 DIAGNOSIS — I10 Essential (primary) hypertension: Secondary | ICD-10-CM | POA: Diagnosis not present

## 2016-05-09 DIAGNOSIS — K219 Gastro-esophageal reflux disease without esophagitis: Secondary | ICD-10-CM | POA: Diagnosis not present

## 2016-05-09 DIAGNOSIS — M4806 Spinal stenosis, lumbar region: Secondary | ICD-10-CM | POA: Diagnosis not present

## 2016-05-10 DIAGNOSIS — R2689 Other abnormalities of gait and mobility: Secondary | ICD-10-CM | POA: Diagnosis not present

## 2016-05-10 DIAGNOSIS — M4806 Spinal stenosis, lumbar region: Secondary | ICD-10-CM | POA: Diagnosis not present

## 2016-05-10 DIAGNOSIS — I10 Essential (primary) hypertension: Secondary | ICD-10-CM | POA: Diagnosis not present

## 2016-05-10 DIAGNOSIS — K219 Gastro-esophageal reflux disease without esophagitis: Secondary | ICD-10-CM | POA: Diagnosis not present

## 2016-05-10 DIAGNOSIS — Z9181 History of falling: Secondary | ICD-10-CM | POA: Diagnosis not present

## 2016-05-10 DIAGNOSIS — Z87891 Personal history of nicotine dependence: Secondary | ICD-10-CM | POA: Diagnosis not present

## 2016-05-10 DIAGNOSIS — M4726 Other spondylosis with radiculopathy, lumbar region: Secondary | ICD-10-CM | POA: Diagnosis not present

## 2016-05-11 DIAGNOSIS — Z87891 Personal history of nicotine dependence: Secondary | ICD-10-CM | POA: Diagnosis not present

## 2016-05-11 DIAGNOSIS — M4806 Spinal stenosis, lumbar region: Secondary | ICD-10-CM | POA: Diagnosis not present

## 2016-05-11 DIAGNOSIS — K219 Gastro-esophageal reflux disease without esophagitis: Secondary | ICD-10-CM | POA: Diagnosis not present

## 2016-05-11 DIAGNOSIS — M4726 Other spondylosis with radiculopathy, lumbar region: Secondary | ICD-10-CM | POA: Diagnosis not present

## 2016-05-11 DIAGNOSIS — R2689 Other abnormalities of gait and mobility: Secondary | ICD-10-CM | POA: Diagnosis not present

## 2016-05-11 DIAGNOSIS — I10 Essential (primary) hypertension: Secondary | ICD-10-CM | POA: Diagnosis not present

## 2016-05-11 DIAGNOSIS — Z9181 History of falling: Secondary | ICD-10-CM | POA: Diagnosis not present

## 2016-05-18 DIAGNOSIS — Z9181 History of falling: Secondary | ICD-10-CM | POA: Diagnosis not present

## 2016-05-18 DIAGNOSIS — K219 Gastro-esophageal reflux disease without esophagitis: Secondary | ICD-10-CM | POA: Diagnosis not present

## 2016-05-18 DIAGNOSIS — R2689 Other abnormalities of gait and mobility: Secondary | ICD-10-CM | POA: Diagnosis not present

## 2016-05-18 DIAGNOSIS — M4806 Spinal stenosis, lumbar region: Secondary | ICD-10-CM | POA: Diagnosis not present

## 2016-05-18 DIAGNOSIS — M4726 Other spondylosis with radiculopathy, lumbar region: Secondary | ICD-10-CM | POA: Diagnosis not present

## 2016-05-18 DIAGNOSIS — I10 Essential (primary) hypertension: Secondary | ICD-10-CM | POA: Diagnosis not present

## 2016-05-18 DIAGNOSIS — Z87891 Personal history of nicotine dependence: Secondary | ICD-10-CM | POA: Diagnosis not present

## 2016-05-19 DIAGNOSIS — Z87891 Personal history of nicotine dependence: Secondary | ICD-10-CM | POA: Diagnosis not present

## 2016-05-19 DIAGNOSIS — K219 Gastro-esophageal reflux disease without esophagitis: Secondary | ICD-10-CM | POA: Diagnosis not present

## 2016-05-19 DIAGNOSIS — M4806 Spinal stenosis, lumbar region: Secondary | ICD-10-CM | POA: Diagnosis not present

## 2016-05-19 DIAGNOSIS — Z9181 History of falling: Secondary | ICD-10-CM | POA: Diagnosis not present

## 2016-05-19 DIAGNOSIS — I10 Essential (primary) hypertension: Secondary | ICD-10-CM | POA: Diagnosis not present

## 2016-05-19 DIAGNOSIS — M4726 Other spondylosis with radiculopathy, lumbar region: Secondary | ICD-10-CM | POA: Diagnosis not present

## 2016-05-19 DIAGNOSIS — R2689 Other abnormalities of gait and mobility: Secondary | ICD-10-CM | POA: Diagnosis not present

## 2016-05-22 DIAGNOSIS — M4806 Spinal stenosis, lumbar region: Secondary | ICD-10-CM | POA: Diagnosis not present

## 2016-05-22 DIAGNOSIS — I10 Essential (primary) hypertension: Secondary | ICD-10-CM | POA: Diagnosis not present

## 2016-05-22 DIAGNOSIS — Z9181 History of falling: Secondary | ICD-10-CM | POA: Diagnosis not present

## 2016-05-22 DIAGNOSIS — K219 Gastro-esophageal reflux disease without esophagitis: Secondary | ICD-10-CM | POA: Diagnosis not present

## 2016-05-22 DIAGNOSIS — M4726 Other spondylosis with radiculopathy, lumbar region: Secondary | ICD-10-CM | POA: Diagnosis not present

## 2016-05-22 DIAGNOSIS — Z87891 Personal history of nicotine dependence: Secondary | ICD-10-CM | POA: Diagnosis not present

## 2016-05-22 DIAGNOSIS — R2689 Other abnormalities of gait and mobility: Secondary | ICD-10-CM | POA: Diagnosis not present

## 2016-05-24 DIAGNOSIS — Z9181 History of falling: Secondary | ICD-10-CM | POA: Diagnosis not present

## 2016-05-24 DIAGNOSIS — Z87891 Personal history of nicotine dependence: Secondary | ICD-10-CM | POA: Diagnosis not present

## 2016-05-24 DIAGNOSIS — R2689 Other abnormalities of gait and mobility: Secondary | ICD-10-CM | POA: Diagnosis not present

## 2016-05-24 DIAGNOSIS — I10 Essential (primary) hypertension: Secondary | ICD-10-CM | POA: Diagnosis not present

## 2016-05-24 DIAGNOSIS — M4726 Other spondylosis with radiculopathy, lumbar region: Secondary | ICD-10-CM | POA: Diagnosis not present

## 2016-05-24 DIAGNOSIS — K219 Gastro-esophageal reflux disease without esophagitis: Secondary | ICD-10-CM | POA: Diagnosis not present

## 2016-05-24 DIAGNOSIS — M4806 Spinal stenosis, lumbar region: Secondary | ICD-10-CM | POA: Diagnosis not present

## 2016-05-30 DIAGNOSIS — M4806 Spinal stenosis, lumbar region: Secondary | ICD-10-CM | POA: Diagnosis not present

## 2016-05-30 DIAGNOSIS — R2689 Other abnormalities of gait and mobility: Secondary | ICD-10-CM | POA: Diagnosis not present

## 2016-05-30 DIAGNOSIS — Z87891 Personal history of nicotine dependence: Secondary | ICD-10-CM | POA: Diagnosis not present

## 2016-05-30 DIAGNOSIS — M4726 Other spondylosis with radiculopathy, lumbar region: Secondary | ICD-10-CM | POA: Diagnosis not present

## 2016-05-30 DIAGNOSIS — Z9181 History of falling: Secondary | ICD-10-CM | POA: Diagnosis not present

## 2016-05-30 DIAGNOSIS — K219 Gastro-esophageal reflux disease without esophagitis: Secondary | ICD-10-CM | POA: Diagnosis not present

## 2016-05-30 DIAGNOSIS — I10 Essential (primary) hypertension: Secondary | ICD-10-CM | POA: Diagnosis not present

## 2016-05-31 DIAGNOSIS — I10 Essential (primary) hypertension: Secondary | ICD-10-CM | POA: Diagnosis not present

## 2016-05-31 DIAGNOSIS — Z87891 Personal history of nicotine dependence: Secondary | ICD-10-CM | POA: Diagnosis not present

## 2016-05-31 DIAGNOSIS — R2689 Other abnormalities of gait and mobility: Secondary | ICD-10-CM | POA: Diagnosis not present

## 2016-05-31 DIAGNOSIS — K219 Gastro-esophageal reflux disease without esophagitis: Secondary | ICD-10-CM | POA: Diagnosis not present

## 2016-05-31 DIAGNOSIS — M4726 Other spondylosis with radiculopathy, lumbar region: Secondary | ICD-10-CM | POA: Diagnosis not present

## 2016-05-31 DIAGNOSIS — Z9181 History of falling: Secondary | ICD-10-CM | POA: Diagnosis not present

## 2016-05-31 DIAGNOSIS — M4806 Spinal stenosis, lumbar region: Secondary | ICD-10-CM | POA: Diagnosis not present

## 2016-06-01 DIAGNOSIS — K219 Gastro-esophageal reflux disease without esophagitis: Secondary | ICD-10-CM | POA: Diagnosis not present

## 2016-06-01 DIAGNOSIS — I10 Essential (primary) hypertension: Secondary | ICD-10-CM | POA: Diagnosis not present

## 2016-06-01 DIAGNOSIS — M4806 Spinal stenosis, lumbar region: Secondary | ICD-10-CM | POA: Diagnosis not present

## 2016-06-01 DIAGNOSIS — R2689 Other abnormalities of gait and mobility: Secondary | ICD-10-CM | POA: Diagnosis not present

## 2016-06-01 DIAGNOSIS — Z87891 Personal history of nicotine dependence: Secondary | ICD-10-CM | POA: Diagnosis not present

## 2016-06-01 DIAGNOSIS — M4726 Other spondylosis with radiculopathy, lumbar region: Secondary | ICD-10-CM | POA: Diagnosis not present

## 2016-06-01 DIAGNOSIS — Z9181 History of falling: Secondary | ICD-10-CM | POA: Diagnosis not present

## 2016-06-05 DIAGNOSIS — Z9181 History of falling: Secondary | ICD-10-CM | POA: Diagnosis not present

## 2016-06-05 DIAGNOSIS — Z87891 Personal history of nicotine dependence: Secondary | ICD-10-CM | POA: Diagnosis not present

## 2016-06-05 DIAGNOSIS — I10 Essential (primary) hypertension: Secondary | ICD-10-CM | POA: Diagnosis not present

## 2016-06-05 DIAGNOSIS — M4726 Other spondylosis with radiculopathy, lumbar region: Secondary | ICD-10-CM | POA: Diagnosis not present

## 2016-06-05 DIAGNOSIS — K219 Gastro-esophageal reflux disease without esophagitis: Secondary | ICD-10-CM | POA: Diagnosis not present

## 2016-06-05 DIAGNOSIS — M4806 Spinal stenosis, lumbar region: Secondary | ICD-10-CM | POA: Diagnosis not present

## 2016-06-05 DIAGNOSIS — R2689 Other abnormalities of gait and mobility: Secondary | ICD-10-CM | POA: Diagnosis not present

## 2016-06-09 DIAGNOSIS — M4726 Other spondylosis with radiculopathy, lumbar region: Secondary | ICD-10-CM | POA: Diagnosis not present

## 2016-06-09 DIAGNOSIS — R2689 Other abnormalities of gait and mobility: Secondary | ICD-10-CM | POA: Diagnosis not present

## 2016-06-09 DIAGNOSIS — I10 Essential (primary) hypertension: Secondary | ICD-10-CM | POA: Diagnosis not present

## 2016-06-09 DIAGNOSIS — Z9181 History of falling: Secondary | ICD-10-CM | POA: Diagnosis not present

## 2016-06-09 DIAGNOSIS — Z87891 Personal history of nicotine dependence: Secondary | ICD-10-CM | POA: Diagnosis not present

## 2016-06-09 DIAGNOSIS — M4806 Spinal stenosis, lumbar region: Secondary | ICD-10-CM | POA: Diagnosis not present

## 2016-06-09 DIAGNOSIS — K219 Gastro-esophageal reflux disease without esophagitis: Secondary | ICD-10-CM | POA: Diagnosis not present

## 2016-07-25 ENCOUNTER — Emergency Department (HOSPITAL_COMMUNITY): Payer: Medicare Other

## 2016-07-25 ENCOUNTER — Encounter (HOSPITAL_COMMUNITY): Payer: Self-pay

## 2016-07-25 ENCOUNTER — Inpatient Hospital Stay (HOSPITAL_COMMUNITY)
Admission: EM | Admit: 2016-07-25 | Discharge: 2016-07-27 | DRG: 690 | Disposition: A | Discharge status: Skilled Nursing Facility | Payer: Medicare Other | Attending: Internal Medicine | Admitting: Internal Medicine

## 2016-07-25 DIAGNOSIS — K219 Gastro-esophageal reflux disease without esophagitis: Secondary | ICD-10-CM | POA: Diagnosis present

## 2016-07-25 DIAGNOSIS — S299XXA Unspecified injury of thorax, initial encounter: Secondary | ICD-10-CM | POA: Diagnosis not present

## 2016-07-25 DIAGNOSIS — Z809 Family history of malignant neoplasm, unspecified: Secondary | ICD-10-CM

## 2016-07-25 DIAGNOSIS — Z9181 History of falling: Secondary | ICD-10-CM

## 2016-07-25 DIAGNOSIS — F0390 Unspecified dementia without behavioral disturbance: Secondary | ICD-10-CM | POA: Diagnosis present

## 2016-07-25 DIAGNOSIS — R296 Repeated falls: Secondary | ICD-10-CM | POA: Diagnosis not present

## 2016-07-25 DIAGNOSIS — Z79899 Other long term (current) drug therapy: Secondary | ICD-10-CM

## 2016-07-25 DIAGNOSIS — T148 Other injury of unspecified body region: Secondary | ICD-10-CM | POA: Diagnosis not present

## 2016-07-25 DIAGNOSIS — R410 Disorientation, unspecified: Secondary | ICD-10-CM | POA: Diagnosis not present

## 2016-07-25 DIAGNOSIS — E860 Dehydration: Secondary | ICD-10-CM | POA: Diagnosis not present

## 2016-07-25 DIAGNOSIS — Z8546 Personal history of malignant neoplasm of prostate: Secondary | ICD-10-CM

## 2016-07-25 DIAGNOSIS — N390 Urinary tract infection, site not specified: Secondary | ICD-10-CM | POA: Diagnosis not present

## 2016-07-25 DIAGNOSIS — W19XXXA Unspecified fall, initial encounter: Secondary | ICD-10-CM

## 2016-07-25 DIAGNOSIS — Z87891 Personal history of nicotine dependence: Secondary | ICD-10-CM

## 2016-07-25 DIAGNOSIS — M542 Cervicalgia: Secondary | ICD-10-CM | POA: Diagnosis not present

## 2016-07-25 DIAGNOSIS — G629 Polyneuropathy, unspecified: Secondary | ICD-10-CM | POA: Diagnosis present

## 2016-07-25 DIAGNOSIS — S0990XA Unspecified injury of head, initial encounter: Secondary | ICD-10-CM | POA: Diagnosis not present

## 2016-07-25 DIAGNOSIS — G3184 Mild cognitive impairment, so stated: Secondary | ICD-10-CM | POA: Diagnosis present

## 2016-07-25 DIAGNOSIS — Z91018 Allergy to other foods: Secondary | ICD-10-CM

## 2016-07-25 DIAGNOSIS — M6282 Rhabdomyolysis: Secondary | ICD-10-CM | POA: Diagnosis not present

## 2016-07-25 DIAGNOSIS — R748 Abnormal levels of other serum enzymes: Secondary | ICD-10-CM | POA: Diagnosis present

## 2016-07-25 DIAGNOSIS — Z9079 Acquired absence of other genital organ(s): Secondary | ICD-10-CM

## 2016-07-25 DIAGNOSIS — R627 Adult failure to thrive: Secondary | ICD-10-CM | POA: Diagnosis not present

## 2016-07-25 DIAGNOSIS — I10 Essential (primary) hypertension: Secondary | ICD-10-CM | POA: Diagnosis not present

## 2016-07-25 DIAGNOSIS — S199XXA Unspecified injury of neck, initial encounter: Secondary | ICD-10-CM | POA: Diagnosis not present

## 2016-07-25 DIAGNOSIS — W06XXXA Fall from bed, initial encounter: Secondary | ICD-10-CM | POA: Diagnosis present

## 2016-07-25 LAB — COMPREHENSIVE METABOLIC PANEL
ALT: 15 U/L — ABNORMAL LOW (ref 17–63)
ANION GAP: 10 (ref 5–15)
AST: 38 U/L (ref 15–41)
Albumin: 4.4 g/dL (ref 3.5–5.0)
Alkaline Phosphatase: 48 U/L (ref 38–126)
BILIRUBIN TOTAL: 0.9 mg/dL (ref 0.3–1.2)
BUN: 18 mg/dL (ref 6–20)
CO2: 24 mmol/L (ref 22–32)
Calcium: 9.1 mg/dL (ref 8.9–10.3)
Chloride: 106 mmol/L (ref 101–111)
Creatinine, Ser: 0.97 mg/dL (ref 0.61–1.24)
Glucose, Bld: 80 mg/dL (ref 65–99)
POTASSIUM: 3.6 mmol/L (ref 3.5–5.1)
Sodium: 140 mmol/L (ref 135–145)
TOTAL PROTEIN: 7.4 g/dL (ref 6.5–8.1)

## 2016-07-25 LAB — CBC WITH DIFFERENTIAL/PLATELET
Basophils Absolute: 0 10*3/uL (ref 0.0–0.1)
Basophils Relative: 0 %
EOS PCT: 1 %
Eosinophils Absolute: 0.1 10*3/uL (ref 0.0–0.7)
HEMATOCRIT: 39.1 % (ref 39.0–52.0)
Hemoglobin: 12.6 g/dL — ABNORMAL LOW (ref 13.0–17.0)
LYMPHS PCT: 18 %
Lymphs Abs: 1.5 10*3/uL (ref 0.7–4.0)
MCH: 27.2 pg (ref 26.0–34.0)
MCHC: 32.2 g/dL (ref 30.0–36.0)
MCV: 84.3 fL (ref 78.0–100.0)
MONO ABS: 0.6 10*3/uL (ref 0.1–1.0)
MONOS PCT: 8 %
NEUTROS ABS: 6.1 10*3/uL (ref 1.7–7.7)
Neutrophils Relative %: 73 %
PLATELETS: 325 10*3/uL (ref 150–400)
RBC: 4.64 MIL/uL (ref 4.22–5.81)
RDW: 15 % (ref 11.5–15.5)
WBC: 8.3 10*3/uL (ref 4.0–10.5)

## 2016-07-25 LAB — I-STAT TROPONIN, ED: TROPONIN I, POC: 0 ng/mL (ref 0.00–0.08)

## 2016-07-25 LAB — URINALYSIS, ROUTINE W REFLEX MICROSCOPIC
BILIRUBIN URINE: NEGATIVE
Glucose, UA: NEGATIVE mg/dL
KETONES UR: 40 mg/dL — AB
Nitrite: POSITIVE — AB
Protein, ur: NEGATIVE mg/dL
SPECIFIC GRAVITY, URINE: 1.026 (ref 1.005–1.030)
pH: 5 (ref 5.0–8.0)

## 2016-07-25 LAB — URINE MICROSCOPIC-ADD ON

## 2016-07-25 LAB — LACTIC ACID, PLASMA
Lactic Acid, Venous: 1.9 mmol/L (ref 0.5–1.9)
Lactic Acid, Venous: 1.3 mmol/L (ref 0.5–1.9)

## 2016-07-25 LAB — I-STAT CG4 LACTIC ACID, ED: LACTIC ACID, VENOUS: 1.92 mmol/L — AB (ref 0.5–1.9)

## 2016-07-25 LAB — CK: CK TOTAL: 1769 U/L — AB (ref 49–397)

## 2016-07-25 MED ORDER — PANTOPRAZOLE SODIUM 40 MG PO TBEC
40.0000 mg | DELAYED_RELEASE_TABLET | Freq: Two times a day (BID) | ORAL | Status: DC
  Administered 2016-07-25 – 2016-07-27 (×4): 40 mg via ORAL
  Filled 2016-07-25 (×4): qty 1

## 2016-07-25 MED ORDER — DEXTROSE 5 % IV SOLN
1.0000 g | Freq: Once | INTRAVENOUS | Status: AC
  Administered 2016-07-25: 1 g via INTRAVENOUS
  Filled 2016-07-25: qty 10

## 2016-07-25 MED ORDER — ACETAMINOPHEN 325 MG PO TABS: 650.0000 mg | ORAL_TABLET | Freq: Four times a day (QID) | ORAL | Status: DC | PRN

## 2016-07-25 MED ORDER — SODIUM CHLORIDE 0.9 % IV BOLUS (SEPSIS)
500.0000 mL | Freq: Once | INTRAVENOUS | Status: AC
Start: 2016-07-25 — End: 2016-07-26
  Administered 2016-07-25: 500 mL via INTRAVENOUS

## 2016-07-25 MED ORDER — BISACODYL 10 MG RE SUPP: 10.0000 mg | Freq: Every day | RECTAL | Status: DC | PRN

## 2016-07-25 MED ORDER — TRAZODONE HCL 50 MG PO TABS
50.0000 mg | ORAL_TABLET | Freq: Every day | ORAL | Status: DC
  Administered 2016-07-25 – 2016-07-26 (×2): 50 mg via ORAL
  Filled 2016-07-25 (×2): qty 1

## 2016-07-25 MED ORDER — TRAMADOL HCL 50 MG PO TABS
50.0000 mg | ORAL_TABLET | Freq: Four times a day (QID) | ORAL | Status: DC | PRN
  Administered 2016-07-26 – 2016-07-27 (×3): 50 mg via ORAL
  Filled 2016-07-25 (×4): qty 1

## 2016-07-25 MED ORDER — SODIUM CHLORIDE 0.9 % IV BOLUS (SEPSIS): 500.0000 mL | Freq: Once | INTRAVENOUS | Status: DC

## 2016-07-25 MED ORDER — ACETAMINOPHEN 650 MG RE SUPP: 650.0000 mg | Freq: Four times a day (QID) | RECTAL | Status: DC | PRN

## 2016-07-25 MED ORDER — HYDROCHLOROTHIAZIDE 12.5 MG PO CAPS
12.5000 mg | ORAL_CAPSULE | Freq: Every day | ORAL | Status: DC
  Administered 2016-07-26 – 2016-07-27 (×2): 12.5 mg via ORAL
  Filled 2016-07-25 (×2): qty 1

## 2016-07-25 MED ORDER — ENOXAPARIN SODIUM 40 MG/0.4ML ~~LOC~~ SOLN
40.0000 mg | SUBCUTANEOUS | Status: DC
  Administered 2016-07-25 – 2016-07-26 (×2): 40 mg via SUBCUTANEOUS
  Filled 2016-07-25 (×2): qty 0.4

## 2016-07-25 MED ORDER — SODIUM CHLORIDE 0.9 % IV SOLN: Freq: Once | INTRAVENOUS | Status: DC

## 2016-07-25 MED ORDER — ONDANSETRON HCL 4 MG/2ML IJ SOLN: 4.0000 mg | Freq: Four times a day (QID) | INTRAMUSCULAR | Status: DC | PRN

## 2016-07-25 MED ORDER — CEFTRIAXONE SODIUM 1 G IJ SOLR
1.0000 g | INTRAMUSCULAR | Status: DC
  Administered 2016-07-26 – 2016-07-27 (×2): 1 g via INTRAVENOUS
  Filled 2016-07-25 (×2): qty 10

## 2016-07-25 MED ORDER — DOCUSATE SODIUM 100 MG PO CAPS
100.0000 mg | ORAL_CAPSULE | Freq: Two times a day (BID) | ORAL | Status: DC
  Administered 2016-07-25 – 2016-07-27 (×4): 100 mg via ORAL
  Filled 2016-07-25 (×4): qty 1

## 2016-07-25 MED ORDER — LACTATED RINGERS IV SOLN
INTRAVENOUS | Status: DC
  Administered 2016-07-25 – 2016-07-26 (×3): via INTRAVENOUS
  Administered 2016-07-27: 1000 mL via INTRAVENOUS

## 2016-07-25 MED ORDER — MELOXICAM 7.5 MG PO TABS
7.5000 mg | ORAL_TABLET | Freq: Every day | ORAL | Status: DC
  Administered 2016-07-25 – 2016-07-27 (×3): 7.5 mg via ORAL
  Filled 2016-07-25 (×3): qty 1

## 2016-07-25 MED ORDER — ONDANSETRON HCL 4 MG PO TABS: 4.0000 mg | ORAL_TABLET | Freq: Four times a day (QID) | ORAL | Status: DC | PRN

## 2016-07-25 NOTE — Progress Notes (Signed)
PHARMACY NOTE -   Pharmacy has been asked to assist with dosing of Ceftriaxone for UTI. Rocephin 1gm IV x 1 given @ [id number]. Will start Rocephin 1gm IV Q24h starting tomorrow. Need for further dosage adjustment appears unlikely at present.   Will sign off at this time. Please reconsult if a change in clinical status warrants re-evaluation of dosage.  Thanks, Garnet Sierras, PharmD 07/25/2016

## 2016-07-25 NOTE — ED Notes (Signed)
Bed: WA15 Expected date:  Expected time:  Means of arrival:  Comments: EMS-fall 

## 2016-07-25 NOTE — ED Triage Notes (Signed)
Per EMS- Patient's nephew reported that he last saw the patient at lunch yesterday. Today he finds the patient lying supine with legs dangling off of the bed. Patient reported that he fell and hit his head on a night stand. Patient's nephew stated that the patient was more confused and had lower extremity weakness.No injury noted to the head and no blood at the scene. c- collar placed.

## 2016-07-25 NOTE — H&P (Addendum)
History and Physical    Evan Charles [id number] DOB: [birth year] DOA: 07/25/2016  PCP: Dr. Romero Liner at [facility] Consultants:  "Not exactly"; podiatrist - [facility] Pulling Patient coming from: home - lives alone; NOKNicole Kindred, nephew, [phone]  Chief Complaint: falls  HPI: Evan Charles is a 70 y.o. male with medical history significant of prostate cancer, chronic mental illness (NOS?), GERD, HTN presenting after a neighbor went to check on him and found him unable to get out of bed.  The patient is a poor historian and reports that he was "having some problems.  I couldn't stay up off the floor."  He also reports a different history from that of the ER physician.  He says that [name] (his nephew) helped but kept insisting he come to the doctor.  Had food but couldn't eat it, spilled it all over himself.  Went ahead and decided [name] could bring him to the hospital.  [place] [address] twice.  He does acknowledge urinary symptoms for about a month.  Dysuria, hesitation, feeling of incomplete evacuation.   ED Course: 1000 cc NS bolus; Rocephin 1 gram IV  Review of Systems: As per HPI; otherwise 10 point review of systems reviewed and negative.   Ambulatory Status:  "I do it all".  Walks with a cane outside the home.  Multiple canes in the house.  "Kinda hard, difficult to take a walk."   Past Medical History:  Diagnosis Date  . Back pain   . Chronic mental illness   . Colitis   . GERD (gastroesophageal reflux disease)   . Hypertension   . Neuropathy ([place])    " MY HANDS "  . Prostate cancer [facility])     Past Surgical History:  Procedure Laterality Date  . KNEE ARTHROSCOPY    . LAMINECTOMY    . PROSTATECTOMY      Social History   Social History  . Marital status: Single    Spouse name: N/A  . Number of children: N/A  . Years of education: N/A   Occupational History  . Retired    Social History Main Topics  . Smoking status: Former Smoker    Types: Cigarettes  . Smokeless tobacco:  Never Used  . Alcohol use No  . Drug use: No  . Sexual activity: Not on file   Other Topics Concern  . Not on file   Social History Narrative  . No narrative on file    Allergies  Allergen Reactions  . Tomato Itching and Rash    Family History  Problem Relation Age of Onset  . Cancer Brother   . Cancer Maternal Grandmother     Prior to Admission medications   Medication Sig Start Date End Date Taking? Authorizing Provider  bisacodyl (DULCOLAX) 10 MG suppository Place 1 suppository (10 mg total) rectally daily as needed for moderate constipation. 03/22/16  Yes Ripudeep Krystal Eaton, MD  docusate sodium (COLACE) 100 MG capsule Take 1 capsule (100 mg total) by mouth 2 (two) times daily. 03/22/16  Yes Ripudeep Krystal Eaton, MD  hydrochlorothiazide (MICROZIDE) 12.5 MG capsule Take 1 capsule (12.5 mg total) by mouth daily. 03/22/16  Yes Ripudeep Krystal Eaton, MD  meloxicam (MOBIC) 7.5 MG tablet Take 1 tablet (7.5 mg total) by mouth daily. 03/22/16  Yes Ripudeep Krystal Eaton, MD  pantoprazole (PROTONIX) 40 MG tablet Take 1 tablet (40 mg total) by mouth 2 (two) times daily. 12/16/15  Yes Leo Grosser, MD  traMADol (ULTRAM) 50 MG tablet Take 50 mg  by mouth every 6 (six) hours as needed for moderate pain.    Yes Historical Provider, MD  traZODone (DESYREL) 50 MG tablet Take 1 tablet (50 mg total) by mouth at bedtime. 12/16/15  Yes Leo Grosser, MD  acetaminophen (TYLENOL) 325 MG tablet Take 2 tablets (650 mg total) by mouth every 6 (six) hours as needed for mild pain (or Fever >/= 101). Patient not taking: Reported on 07/25/2016 03/22/16   Ripudeep Krystal Eaton, MD  cholecalciferol (VITAMIN D) 1000 units tablet Take 1 tablet (1,000 Units total) by mouth daily. Patient not taking: Reported on 07/25/2016 12/16/15   Leo Grosser, MD  HYDROcodone-acetaminophen (NORCO/VICODIN) 5-325 MG tablet Take 1-2 tablets by mouth every 6 (six) hours as needed for severe pain. Patient not taking: Reported on 07/25/2016 03/22/16   Ripudeep Krystal Eaton, MD    methocarbamol (ROBAXIN) 500 MG tablet Take 1 tablet (500 mg total) by mouth 3 (three) times daily. Patient not taking: Reported on 07/25/2016 03/22/16   Ripudeep Krystal Eaton, MD    Physical Exam: Vitals:   07/25/16 1700 07/25/16 1712 07/25/16 1802 07/25/16 1830  BP: 123/69 123/69 129/92 (!) 126/103  Pulse:  90 92   Resp: 18 18 21 22   Temp:      TempSrc:      SpO2:  100% 100%   Weight:      Height:         General: Appears calm and comfortable and is NAD Eyes:  PERRL, EOMI, normal lids, iris ENT:  grossly normal hearing, lips & tongue, mildly dry mm Neck:  no LAD, masses or thyromegaly Cardiovascular:  RRR, no m/r/g. No LE edema.  Respiratory:  CTA bilaterally, no w/r/r. Normal respiratory effort. Abdomen:  soft, ntnd, NABS Skin:  no rash or induration seen on limited exam Musculoskeletal:  grossly normal tone BUE/BLE, good ROM, no bony abnormality Psychiatric: AOx3 but with hesitation, doesn't know who the president is; tangential in speech, takes time to respond, appears to have some mild cognitive impairment Neurologic: CN 2-12 grossly intact, moves all extremities in coordinated fashion, sensation intact  Labs on Admission: I have personally reviewed following labs and imaging studies  CBC:  Recent Labs Lab 07/25/16 1700  WBC 8.3  NEUTROABS 6.1  HGB 12.6*  HCT 39.1  MCV 84.3  PLT [id number]   Basic Metabolic Panel:  Recent Labs Lab 07/25/16 1700  NA 140  K 3.6  CL 106  CO2 24  GLUCOSE 80  BUN 18  CREATININE 0.97  CALCIUM 9.1   GFR: Estimated Creatinine Clearance: 73.2 mL/min (by C-G formula based on SCr of 0.97 mg/dL). Liver Function Tests:  Recent Labs Lab 07/25/16 1700  AST 38  ALT 15*  ALKPHOS 48  BILITOT 0.9  PROT 7.4  ALBUMIN 4.4   No results for input(s): LIPASE, AMYLASE in the last 168 hours. No results for input(s): AMMONIA in the last 168 hours. Coagulation Profile: No results for input(s): INR, PROTIME in the last 168 hours. Cardiac  Enzymes:  Recent Labs Lab 07/25/16 1700  CKTOTAL 1,769*   BNP (last 3 results) No results for input(s): PROBNP in the last 8760 hours. HbA1C: No results for input(s): HGBA1C in the last 72 hours. CBG: No results for input(s): GLUCAP in the last 168 hours. Lipid Profile: No results for input(s): CHOL, HDL, LDLCALC, TRIG, CHOLHDL, LDLDIRECT in the last 72 hours. Thyroid Function Tests: No results for input(s): TSH, T4TOTAL, FREET4, T3FREE, THYROIDAB in the last 72 hours. Anemia Panel: No results  for input(s): VITAMINB12, FOLATE, FERRITIN, TIBC, IRON, RETICCTPCT in the last 72 hours. Urine analysis:    Component Value Date/Time   COLORURINE YELLOW 07/25/2016 [place] 07/25/2016 1639   LABSPEC 1.026 07/25/2016 1639   PHURINE 5.0 07/25/2016 1639   GLUCOSEU NEGATIVE 07/25/2016 1639   HGBUR SMALL (A) 07/25/2016 1639   BILIRUBINUR NEGATIVE 07/25/2016 1639   KETONESUR 40 (A) 07/25/2016 1639   PROTEINUR NEGATIVE 07/25/2016 1639   UROBILINOGEN 0.2 02/13/2015 1957   NITRITE POSITIVE (A) 07/25/2016 1639   LEUKOCYTESUR SMALL (A) 07/25/2016 1639    Creatinine Clearance: Estimated Creatinine Clearance: 73.2 mL/min (by C-G formula based on SCr of 0.97 mg/dL).  Sepsis Labs: @LABRCNTIP (procalcitonin:4,lacticidven:4) )No results found for this or any previous visit (from the past 240 hour(s)).   Radiological Exams on Admission: Ct Head Wo Contrast  Result Date: 07/25/2016 CLINICAL DATA:  [place] out of bit head with subsequent confusion and lower extremity weakness. EXAM: CT HEAD WITHOUT CONTRAST CT CERVICAL SPINE WITHOUT CONTRAST TECHNIQUE: Multidetector CT imaging of the head and cervical spine was performed following the standard protocol without intravenous contrast. Multiplanar CT image reconstructions of the cervical spine were also generated. COMPARISON:  03/20/2016.  02/11/2016. FINDINGS: CT HEAD FINDINGS Brain: Mild generalized atrophy. Chronic appearing small vessel  changes of the deep white matter. No sign of acute infarction, mass lesion, hemorrhage, hydrocephalus or extra-axial collection. Vascular: Normal Skull: No fracture or focal lesion. Sinuses/Orbits: Clear/normal Other: None CT CERVICAL SPINE FINDINGS Alignment: Straightening of the cervical lordosis. Skull base and vertebrae: Skullbase is normal. Distant fusion at C5-6. No acute fracture. 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 C2-3: Facet degeneration right worse than left. Foraminal stenosis on the right. C3-4: Spondylosis and facet arthropathy worse on the left. Moderate canal stenosis. Bilateral foraminal stenosis left worse than right. C4-5: Spondylosis and mild facet degeneration. Mild central canal stenosis. Bilateral foraminal stenosis. C5-6: Distant fusion is solid. Canal sufficiently patent. Chronic bilateral foraminal narrowing. C6-7: Degenerative spondylosis. Moderate canal stenosis. Left-sided predominant facet arthropathy. Foraminal narrowing left worse than right. C7-T1: Facet arthropathy worse on the left. No central canal stenosis. Mild left foraminal stenosis. Upper chest: Negative Other: None IMPRESSION: Head CT: No acute or traumatic finding. Atrophy and chronic small vessel change of the deep white matter. Cervical spine CT: No acute or traumatic finding. Old fusion C5-6. Degenerative spondylosis and facet arthropathy above and below as outlined above. Multiple levels of foraminal stenosis and moderate canal stenosis. Electronically Signed   By: Nelson Chimes M.D.   On: 07/25/2016 16:28   Ct Cervical Spine Wo Contrast  Result Date: 07/25/2016 CLINICAL DATA:  [place] out of bit head with subsequent confusion and lower extremity weakness. EXAM: CT HEAD WITHOUT CONTRAST CT CERVICAL SPINE WITHOUT CONTRAST TECHNIQUE: Multidetector CT imaging of the head and cervical spine was performed following the standard protocol without intravenous contrast.  Multiplanar CT image reconstructions of the cervical spine were also generated. COMPARISON:  03/20/2016.  02/11/2016. FINDINGS: CT HEAD FINDINGS Brain: Mild generalized atrophy. Chronic appearing small vessel changes of the deep white matter. No sign of acute infarction, mass lesion, hemorrhage, hydrocephalus or extra-axial collection. Vascular: Normal Skull: No fracture or focal lesion. Sinuses/Orbits: Clear/normal Other: None CT CERVICAL SPINE FINDINGS Alignment: Straightening of the cervical lordosis. Skull base and vertebrae: Skullbase is normal. Distant fusion at C5-6. No acute fracture. 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 C2-3: Facet degeneration right worse  than left. Foraminal stenosis on the right. C3-4: Spondylosis and facet arthropathy worse on the left. Moderate canal stenosis. Bilateral foraminal stenosis left worse than right. C4-5: Spondylosis and mild facet degeneration. Mild central canal stenosis. Bilateral foraminal stenosis. C5-6: Distant fusion is solid. Canal sufficiently patent. Chronic bilateral foraminal narrowing. C6-7: Degenerative spondylosis. Moderate canal stenosis. Left-sided predominant facet arthropathy. Foraminal narrowing left worse than right. C7-T1: Facet arthropathy worse on the left. No central canal stenosis. Mild left foraminal stenosis. Upper chest: Negative Other: None IMPRESSION: Head CT: No acute or traumatic finding. Atrophy and chronic small vessel change of the deep white matter. Cervical spine CT: No acute or traumatic finding. Old fusion C5-6. Degenerative spondylosis and facet arthropathy above and below as outlined above. Multiple levels of foraminal stenosis and moderate canal stenosis. Electronically Signed   By: Nelson Chimes M.D.   On: 07/25/2016 16:28   Dg Chest Port 1 View  Result Date: 07/25/2016 CLINICAL DATA:  Fall yesterday, history prostate cancer EXAM: PORTABLE CHEST 1 VIEW COMPARISON:  02/11/2016  FINDINGS: Cardiomediastinal silhouette is stable. Elevation of the left hemidiaphragm again noted. Stable left basilar atelectasis or scarring. No acute infiltrate or pulmonary edema. No pneumothorax. IMPRESSION: No active disease. Electronically Signed   By: Lahoma Crocker M.D.   On: 07/25/2016 16:07    EKG: Independently reviewed.  NSR with RBBB and LPFB, no evidence of acute ischemia  Assessment/Plan Principal Problem:   UTI (lower urinary tract infection) Active Problems:   GERD without esophagitis   Essential hypertension   Falls   Mild cognitive impairment   Elevated CK   UTI -Patient presenting with falls but also with urinary symptoms -UA with positive nitrite, small LE, 6-30 WBC, and many bacteria -Given Rocephin in ER, will continue while awaiting urine culture -Does not meet sepsis criteria - normal vitals, normal WBC count -Lactate is elevated, likely related to mild volume deficiency in the setting of increased needs from infection; will trend  Elevated CK -Suspect that this resulted from patient with multiple falls and inability to get up -No other evidence of rhabdo, not complaining of myalgias -Will hydrate, has already been bolused, and may have regular diet -Will recheck CK in AM to ensure improvement  Mild cognitive impairment -This appears to be chronic  -Hospitalized for spinal stenosis/LBP in May 2017 and those records indicate h/o mental illness with hallucinations/psychosis but no treatment; they also reported mild dementia -Based on prior description, this appears to be stable for the patient and at relative baseline -His only current psych med is trazodone at the sleep dose  Falls -Prior disposition from hospitalization was to SNF -Patient is currently presenting from home -He already had HHN referrals in the works -He appears to be unsafe at home and would likely benefit from AL vs. SNF placement.  He is a English as a second language teacher and so he may have more options than many  patients. -Will request SW consult for possible placement (long-term) -Will also request PT consult for recommendations about placement  HTN -Continue HCTZ  GERD -Continue Protonix    DVT prophylaxis: Lovenox  Code Status:  Full - confirmed with patient Family Communication: None present  Disposition Plan: To be determined Consults called: SW, PT  Admission status: Observation to med surg, likely ready for discharge in 1-2 days   Karmen Bongo MD [facility]  If 7PM-7AM, please contact night-coverage [email or web address] Password [facility]  07/25/2016, 7:33 PM

## 2016-07-25 NOTE — Progress Notes (Addendum)
Patient presents to ED post fall at home.  EDCM spoke to patient at bedside.  Patient confirms he lives at home alone.  He reports his [name] checks in on him every day.  He reports [name] assists him with ADL's, pays his bills, takes him to the store, brings him food.  Patient confirms he has a 3 in 1 commode, wheelchair, walker and cane at home.  Patient reports, "I get around pretty good at home with my walker."  Patient reports he is active with the [place] in [place].  He reports his pcp at the [facility] [place] is Dr. Romero Liner.  He reports he has home health services with [facility] for PT.  Patient provided [facility] permission to contact [place] and Education officer, museum at the [place].  Patient reports he does not want to go to a facility.  "I have been to 3 or 4 of those and I am sick of them."  [facility] called and spoke to Arlyn Leak who reports patient was discharged from their on 03/26/2016.  EDCM went back to speak to patient who reports he has not been seen by anyone lately from a home health agency.  He reports he would like to have [place] again, "They were pretty good people."  Tim with [facility] agreeable to accept patient and requests home health referral be faxed to [place].  Also requests to speak to [place] social worker to provide authorization for home health services which may take up to 3-5 days.  [facility] [place] [facility] without [facility].  Will attempt to contact tomorrow if no return phone call.  Discussed patient with EDP,  Awaiting disposition.  1803pm.  Patient to be admitted per EDP.  Further chart review patient's daughters Octavian Tomson [phone], Leonia Corona [phone].

## 2016-07-25 NOTE — ED Provider Notes (Signed)
[place] DEPT Provider Note   CSN: [id number] Arrival date & time: 07/25/16  1540     History   Chief Complaint Chief Complaint  Patient presents with  . Fall  . Weakness    HPI Evan Charles is a 70 y.o. male.  HPI 70 year old male with past medical history of hypertension, prostate cancer and reported dementia who presents with fall. Patient is mildly confused, limiting history. Per EMS report, they were called to the patient's house by a neighbor who found him unable to get out of bed. The patient states that over the last several days, he has had mild suprapubic pain and has felt generally weak. He is normally able to get around with a walker, but over the last 24 hours he has been unable to walk more than several feet without becoming extremely fatigued. He states he try to get out of bed yesterday and fell, striking his head. He was able to get himself back up to his bed, but then has been unable to get out of it due to weakness. Denies any fevers or chills. No nausea or vomiting. No recent medication changes  Past Medical History:  Diagnosis Date  . Back pain   . Chronic mental illness   . Colitis   . GERD (gastroesophageal reflux disease)   . Hypertension   . Neuropathy ([place])    " MY HANDS "  . Prostate cancer [facility])     Patient Active Problem List   Diagnosis Date Noted  . UTI (lower urinary tract infection) 07/25/2016  . Falls 07/25/2016  . Mild cognitive impairment 07/25/2016  . Elevated CK 07/25/2016  . 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 Immobility 03/20/2016  . Failure to thrive (0-17) 03/20/2016  . Generalized weakness 03/20/2016  . Failure to thrive in adult 03/20/2016  . Neuropathy ([place])   . Essential hypertension   . Physical deconditioning   . 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 GERD without esophagitis 12/19/2015  . Bilateral lower extremity edema 12/19/2015  . Intractable low back pain 02/14/2015  .  Difficulty walking 02/14/2015  . Prostate cancer ([place]) 02/14/2015  . H/O: upper GI bleed   . Chronic back pain   . Visual hallucinations   . Psychoses     Past Surgical History:  Procedure Laterality Date  . KNEE ARTHROSCOPY    . LAMINECTOMY    . PROSTATECTOMY         Home Medications    Prior to Admission medications   Medication Sig Start Date End Date Taking? Authorizing Provider  bisacodyl (DULCOLAX) 10 MG suppository Place 1 suppository (10 mg total) rectally daily as needed for moderate constipation. 03/22/16  Yes Ripudeep Krystal Eaton, MD  docusate sodium (COLACE) 100 MG capsule Take 1 capsule (100 mg total) by mouth 2 (two) times daily. 03/22/16  Yes Ripudeep Krystal Eaton, MD  hydrochlorothiazide (MICROZIDE) 12.5 MG capsule Take 1 capsule (12.5 mg total) by mouth daily. 03/22/16  Yes Ripudeep Krystal Eaton, MD  meloxicam (MOBIC) 7.5 MG tablet Take 1 tablet (7.5 mg total) by mouth daily. 03/22/16  Yes Ripudeep Krystal Eaton, MD  pantoprazole (PROTONIX) 40 MG tablet Take 1 tablet (40 mg total) by mouth 2 (two) times daily. 12/16/15  Yes Leo Grosser, MD  traMADol (ULTRAM) 50 MG tablet Take 50 mg by mouth every 6 (six) hours as needed for moderate pain.    Yes Historical Provider, MD  traZODone (DESYREL) 50 MG tablet Take 1 tablet (50  mg total) by mouth at bedtime. 12/16/15  Yes Leo Grosser, MD    Family History Family History  Problem Relation Age of Onset  . Cancer Brother   . Cancer Maternal Grandmother     Social History Social History  Substance Use Topics  . Smoking status: Former Smoker    Types: Cigarettes  . Smokeless tobacco: Never Used  . Alcohol use No     Allergies   Tomato   Review of Systems Review of Systems  Constitutional: Positive for fatigue. Negative for chills and fever.  HENT: Negative for congestion and rhinorrhea.   Eyes: Negative for visual disturbance.  Respiratory: Negative for cough, shortness of breath and wheezing.   Cardiovascular: Negative for chest pain and  leg swelling.  Gastrointestinal: Negative for abdominal pain, diarrhea, nausea and vomiting.  Genitourinary: Positive for dysuria. Negative for flank pain.  Musculoskeletal: Negative for neck pain and neck stiffness.  Skin: Negative for rash and wound.  Allergic/Immunologic: Negative for immunocompromised state.  Neurological: Positive for weakness. Negative for syncope and headaches.  All other systems reviewed and are negative.    Physical Exam Updated Vital Signs BP (!) 138/92 (BP Location: Right Arm)   Pulse 98   Temp 98 F (36.7 C) (Oral)   Resp 20   Ht 5\' 9"  (1.753 m)   Wt 187 lb (84.8 kg)   SpO2 100%   BMI 27.62 kg/m   Physical Exam  Constitutional: He appears well-developed and well-nourished. No distress.  HENT:  Head: Normocephalic and atraumatic.  Dry mucous membranes  Eyes: Conjunctivae are normal.  Neck: Neck supple.  Cardiovascular: Normal rate, regular rhythm and normal heart sounds.  Exam reveals no friction rub.   No murmur heard. Pulmonary/Chest: Effort normal and breath sounds normal. No respiratory distress. He has no wheezes. He has no rales.  Abdominal: Soft. Bowel sounds are normal. He exhibits no distension. There is tenderness (Mild, suprapubic). There is no rebound and no guarding.  Musculoskeletal: He exhibits no edema.  Neurological: He is alert. He exhibits normal muscle tone.  Oriented to person and place but not time. Moves all extremities with 5 out of 5 strength. Subjective weakness in bilateral lower extremity. Normal sensation to light touch.  Skin: Skin is warm. Capillary refill takes less than 2 seconds.  Psychiatric: He has a normal mood and affect.  Nursing note and vitals reviewed.    ED Treatments / Results  Labs (all labs ordered are listed, but only abnormal results are displayed) Labs Reviewed  CBC WITH DIFFERENTIAL/PLATELET - Abnormal; Notable for the following:       Result Value   Hemoglobin 12.6 (*)    All other  components within normal limits  COMPREHENSIVE METABOLIC PANEL - Abnormal; Notable for the following:    ALT 15 (*)    All other components within normal limits  URINALYSIS, ROUTINE W REFLEX MICROSCOPIC (NOT AT [facility]) - Abnormal; Notable for the following:    Hgb urine dipstick SMALL (*)    Ketones, ur 40 (*)    Nitrite POSITIVE (*)    Leukocytes, UA SMALL (*)    All other components within normal limits  CK - Abnormal; Notable for the following:    Total CK 1,769 (*)    All other components within normal limits  URINE MICROSCOPIC-ADD ON - Abnormal; Notable for the following:    Squamous Epithelial / LPF 6-30 (*)    Bacteria, UA MANY (*)    All other components within normal  limits  I-STAT CG4 LACTIC ACID, ED - Abnormal; Notable for the following:    Lactic Acid, Venous 1.92 (*)    All other components within normal limits  URINE CULTURE  URINE RAPID DRUG SCREEN, HOSP PERFORMED  LACTIC ACID, PLASMA  LACTIC ACID, PLASMA  CK  BASIC METABOLIC PANEL  CBC  I-STAT TROPOININ, ED    EKG  EKG Interpretation  Date/Time:  Wednesday July 25 2016 15:50:50 EDT Ventricular Rate:  91 PR Interval:    QRS Duration: 162 QT Interval:  406 QTC Calculation: 500 R Axis:   122 Text Interpretation:  Sinus rhythm RBBB and LPFB No significant change since last tracing Confirmed by Shaqueena Mauceri MD, Joesiah Lonon [phone]) on 07/25/2016 4:52:13 PM       Radiology Ct Head Wo Contrast  Result Date: 07/25/2016 CLINICAL DATA:  [place] out of bit head with subsequent confusion and lower extremity weakness. EXAM: CT HEAD WITHOUT CONTRAST CT CERVICAL SPINE WITHOUT CONTRAST TECHNIQUE: Multidetector CT imaging of the head and cervical spine was performed following the standard protocol without intravenous contrast. Multiplanar CT image reconstructions of the cervical spine were also generated. COMPARISON:  03/20/2016.  02/11/2016. FINDINGS: CT HEAD FINDINGS Brain: Mild generalized atrophy. Chronic appearing small vessel  changes of the deep white matter. No sign of acute infarction, mass lesion, hemorrhage, hydrocephalus or extra-axial collection. Vascular: Normal Skull: No fracture or focal lesion. Sinuses/Orbits: Clear/normal Other: None CT CERVICAL SPINE FINDINGS Alignment: Straightening of the cervical lordosis. Skull base and vertebrae: Skullbase is normal. Distant fusion at C5-6. No acute fracture. 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 C2-3: Facet degeneration right worse than left. Foraminal stenosis on the right. C3-4: Spondylosis and facet arthropathy worse on the left. Moderate canal stenosis. Bilateral foraminal stenosis left worse than right. C4-5: Spondylosis and mild facet degeneration. Mild central canal stenosis. Bilateral foraminal stenosis. C5-6: Distant fusion is solid. Canal sufficiently patent. Chronic bilateral foraminal narrowing. C6-7: Degenerative spondylosis. Moderate canal stenosis. Left-sided predominant facet arthropathy. Foraminal narrowing left worse than right. C7-T1: Facet arthropathy worse on the left. No central canal stenosis. Mild left foraminal stenosis. Upper chest: Negative Other: None IMPRESSION: Head CT: No acute or traumatic finding. Atrophy and chronic small vessel change of the deep white matter. Cervical spine CT: No acute or traumatic finding. Old fusion C5-6. Degenerative spondylosis and facet arthropathy above and below as outlined above. Multiple levels of foraminal stenosis and moderate canal stenosis. Electronically Signed   By: Nelson Chimes M.D.   On: 07/25/2016 16:28   Ct Cervical Spine Wo Contrast  Result Date: 07/25/2016 CLINICAL DATA:  [place] out of bit head with subsequent confusion and lower extremity weakness. EXAM: CT HEAD WITHOUT CONTRAST CT CERVICAL SPINE WITHOUT CONTRAST TECHNIQUE: Multidetector CT imaging of the head and cervical spine was performed following the standard protocol without intravenous contrast.  Multiplanar CT image reconstructions of the cervical spine were also generated. COMPARISON:  03/20/2016.  02/11/2016. FINDINGS: CT HEAD FINDINGS Brain: Mild generalized atrophy. Chronic appearing small vessel changes of the deep white matter. No sign of acute infarction, mass lesion, hemorrhage, hydrocephalus or extra-axial collection. Vascular: Normal Skull: No fracture or focal lesion. Sinuses/Orbits: Clear/normal Other: None CT CERVICAL SPINE FINDINGS Alignment: Straightening of the cervical lordosis. Skull base and vertebrae: Skullbase is normal. Distant fusion at C5-6. No acute fracture. 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 C2-3: Facet degeneration right worse than left. Foraminal stenosis on the right. C3-4: Spondylosis  and facet arthropathy worse on the left. Moderate canal stenosis. Bilateral foraminal stenosis left worse than right. C4-5: Spondylosis and mild facet degeneration. Mild central canal stenosis. Bilateral foraminal stenosis. C5-6: Distant fusion is solid. Canal sufficiently patent. Chronic bilateral foraminal narrowing. C6-7: Degenerative spondylosis. Moderate canal stenosis. Left-sided predominant facet arthropathy. Foraminal narrowing left worse than right. C7-T1: Facet arthropathy worse on the left. No central canal stenosis. Mild left foraminal stenosis. Upper chest: Negative Other: None IMPRESSION: Head CT: No acute or traumatic finding. Atrophy and chronic small vessel change of the deep white matter. Cervical spine CT: No acute or traumatic finding. Old fusion C5-6. Degenerative spondylosis and facet arthropathy above and below as outlined above. Multiple levels of foraminal stenosis and moderate canal stenosis. Electronically Signed   By: Nelson Chimes M.D.   On: 07/25/2016 16:28   Dg Chest Port 1 View  Result Date: 07/25/2016 CLINICAL DATA:  Fall yesterday, history prostate cancer EXAM: PORTABLE CHEST 1 VIEW COMPARISON:  02/11/2016  FINDINGS: Cardiomediastinal silhouette is stable. Elevation of the left hemidiaphragm again noted. Stable left basilar atelectasis or scarring. No acute infiltrate or pulmonary edema. No pneumothorax. IMPRESSION: No active disease. Electronically Signed   By: Lahoma Crocker M.D.   On: 07/25/2016 16:07    Procedures Procedures (including critical care time)  Medications Ordered in ED Medications  traMADol (ULTRAM) tablet 50 mg (not administered)  bisacodyl (DULCOLAX) suppository 10 mg (not administered)  docusate sodium (COLACE) capsule 100 mg (100 mg Oral Given 07/25/16 2228)  hydrochlorothiazide (MICROZIDE) capsule 12.5 mg (not administered)  meloxicam (MOBIC) tablet 7.5 mg (7.5 mg Oral Given 07/25/16 2228)  pantoprazole (PROTONIX) EC tablet 40 mg (40 mg Oral Given 07/25/16 2228)  traZODone (DESYREL) tablet 50 mg (50 mg Oral Given 07/25/16 2228)  enoxaparin (LOVENOX) injection 40 mg (40 mg Subcutaneous Given 07/25/16 2228)  acetaminophen (TYLENOL) tablet 650 mg (not administered)    Or  acetaminophen (TYLENOL) suppository 650 mg (not administered)  ondansetron (ZOFRAN) tablet 4 mg (not administered)    Or  ondansetron (ZOFRAN) injection 4 mg (not administered)  lactated ringers infusion ( Intravenous New Bag/Given 07/25/16 2229)  cefTRIAXone (ROCEPHIN) 1 g in dextrose 5 % 50 mL IVPB (not administered)  sodium chloride 0.9 % bolus 500 mL (500 mLs Intravenous New Bag/Given 07/25/16 1758)  cefTRIAXone (ROCEPHIN) 1 g in dextrose 5 % 50 mL IVPB (0 g Intravenous Stopped 07/25/16 1832)     Initial Impression / Assessment and Plan / ED Course  I have reviewed the triage vital signs and the nursing notes.  Pertinent labs & imaging results that were available during my care of the patient were reviewed by me and considered in my medical decision making (see chart for details).  Clinical Course   70 yo M with PMHx of HTN, prostate CA here with confusion, weakness, and dysuria. On arrival, VSS and WNL.  Exam as above. Lab work concerning for acute UTI with likely secondary delirium, with mild elevation in CK 2/2 dehydration, immobility overnight due to weakness. Otherwise, no new focal neuro deficits and CT head is negative. No hypotension, tachycardia, or signs of sepsis. EKG is unremarkable. Will give iVF, Rocephin, admit to Hospitalist for complicated UTI with confusion.  Final Clinical Impressions(s) / ED Diagnoses   Final diagnoses:  Fall  UTI (lower urinary tract infection)  Elevated CK    New Prescriptions Current Discharge Medication List       Duffy Bruce, MD 07/26/16 [phone]

## 2016-07-25 NOTE — ED Notes (Signed)
Pt can go up to unit when they get a special bed for him. They will give [place] a call when bed is available

## 2016-07-26 DIAGNOSIS — Z9181 History of falling: Secondary | ICD-10-CM | POA: Diagnosis not present

## 2016-07-26 DIAGNOSIS — K921 Melena: Secondary | ICD-10-CM | POA: Diagnosis not present

## 2016-07-26 DIAGNOSIS — K219 Gastro-esophageal reflux disease without esophagitis: Secondary | ICD-10-CM | POA: Diagnosis not present

## 2016-07-26 DIAGNOSIS — R627 Adult failure to thrive: Secondary | ICD-10-CM | POA: Diagnosis present

## 2016-07-26 DIAGNOSIS — I10 Essential (primary) hypertension: Secondary | ICD-10-CM

## 2016-07-26 DIAGNOSIS — L709 Acne, unspecified: Secondary | ICD-10-CM | POA: Diagnosis not present

## 2016-07-26 DIAGNOSIS — R531 Weakness: Secondary | ICD-10-CM | POA: Diagnosis not present

## 2016-07-26 DIAGNOSIS — E860 Dehydration: Secondary | ICD-10-CM | POA: Diagnosis present

## 2016-07-26 DIAGNOSIS — G3184 Mild cognitive impairment, so stated: Secondary | ICD-10-CM

## 2016-07-26 DIAGNOSIS — N390 Urinary tract infection, site not specified: Secondary | ICD-10-CM | POA: Diagnosis present

## 2016-07-26 DIAGNOSIS — M810 Age-related osteoporosis without current pathological fracture: Secondary | ICD-10-CM | POA: Diagnosis not present

## 2016-07-26 DIAGNOSIS — Z8546 Personal history of malignant neoplasm of prostate: Secondary | ICD-10-CM | POA: Diagnosis not present

## 2016-07-26 DIAGNOSIS — R262 Difficulty in walking, not elsewhere classified: Secondary | ICD-10-CM | POA: Diagnosis not present

## 2016-07-26 DIAGNOSIS — W19XXXA Unspecified fall, initial encounter: Secondary | ICD-10-CM | POA: Diagnosis not present

## 2016-07-26 DIAGNOSIS — G629 Polyneuropathy, unspecified: Secondary | ICD-10-CM | POA: Diagnosis present

## 2016-07-26 DIAGNOSIS — K922 Gastrointestinal hemorrhage, unspecified: Secondary | ICD-10-CM | POA: Diagnosis not present

## 2016-07-26 DIAGNOSIS — Z87891 Personal history of nicotine dependence: Secondary | ICD-10-CM | POA: Diagnosis not present

## 2016-07-26 DIAGNOSIS — R748 Abnormal levels of other serum enzymes: Secondary | ICD-10-CM | POA: Diagnosis not present

## 2016-07-26 DIAGNOSIS — Z79899 Other long term (current) drug therapy: Secondary | ICD-10-CM | POA: Diagnosis not present

## 2016-07-26 DIAGNOSIS — Z91018 Allergy to other foods: Secondary | ICD-10-CM | POA: Diagnosis not present

## 2016-07-26 DIAGNOSIS — F0390 Unspecified dementia without behavioral disturbance: Secondary | ICD-10-CM | POA: Diagnosis present

## 2016-07-26 DIAGNOSIS — M6282 Rhabdomyolysis: Secondary | ICD-10-CM | POA: Diagnosis present

## 2016-07-26 DIAGNOSIS — Z9079 Acquired absence of other genital organ(s): Secondary | ICD-10-CM | POA: Diagnosis not present

## 2016-07-26 DIAGNOSIS — M545 Low back pain: Secondary | ICD-10-CM | POA: Diagnosis not present

## 2016-07-26 DIAGNOSIS — Z809 Family history of malignant neoplasm, unspecified: Secondary | ICD-10-CM | POA: Diagnosis not present

## 2016-07-26 DIAGNOSIS — R296 Repeated falls: Secondary | ICD-10-CM | POA: Diagnosis not present

## 2016-07-26 DIAGNOSIS — M6281 Muscle weakness (generalized): Secondary | ICD-10-CM | POA: Diagnosis not present

## 2016-07-26 DIAGNOSIS — K5900 Constipation, unspecified: Secondary | ICD-10-CM | POA: Diagnosis not present

## 2016-07-26 DIAGNOSIS — D500 Iron deficiency anemia secondary to blood loss (chronic): Secondary | ICD-10-CM | POA: Diagnosis not present

## 2016-07-26 DIAGNOSIS — W06XXXA Fall from bed, initial encounter: Secondary | ICD-10-CM | POA: Diagnosis present

## 2016-07-26 DIAGNOSIS — C61 Malignant neoplasm of prostate: Secondary | ICD-10-CM | POA: Diagnosis not present

## 2016-07-26 LAB — CBC
HEMATOCRIT: 36.7 % — AB (ref 39.0–52.0)
HEMOGLOBIN: 11.7 g/dL — AB (ref 13.0–17.0)
MCH: 26.7 pg (ref 26.0–34.0)
MCHC: 31.9 g/dL (ref 30.0–36.0)
MCV: 83.8 fL (ref 78.0–100.0)
Platelets: 314 10*3/uL (ref 150–400)
RBC: 4.38 MIL/uL (ref 4.22–5.81)
RDW: 15.1 % (ref 11.5–15.5)
WBC: 6.3 10*3/uL (ref 4.0–10.5)

## 2016-07-26 LAB — BASIC METABOLIC PANEL
Anion gap: 7 (ref 5–15)
BUN: 16 mg/dL (ref 6–20)
CALCIUM: 9.2 mg/dL (ref 8.9–10.3)
CO2: 26 mmol/L (ref 22–32)
CREATININE: 0.9 mg/dL (ref 0.61–1.24)
Chloride: 108 mmol/L (ref 101–111)
GFR calc Af Amer: >60 mL/min (ref >60)
GLUCOSE: 112 mg/dL — AB (ref 65–99)
POTASSIUM: 3.4 mmol/L — AB (ref 3.5–5.1)
Sodium: 141 mmol/L (ref 135–145)

## 2016-07-26 LAB — CK: Total CK: 1922 U/L — ABNORMAL HIGH (ref 49–397)

## 2016-07-26 LAB — RAPID URINE DRUG SCREEN, HOSP PERFORMED
AMPHETAMINES: NOT DETECTED
BARBITURATES: NOT DETECTED
BENZODIAZEPINES: NOT DETECTED
COCAINE: NOT DETECTED
Opiates: NOT DETECTED
TETRAHYDROCANNABINOL: NOT DETECTED

## 2016-07-26 NOTE — Clinical Social Work Note (Signed)
Clinical Social Work Assessment  Patient Details  Name: Evan Charles MRN: [id number] Date of Birth: 01-Feb-1946  Date of referral:  07/26/16               Reason for consult:  Discharge Planning                Permission sought to share information with:    Permission granted to share information::     Name::        Agency::     Relationship::     Contact Information:     Housing/Transportation Living arrangements for the past 2 months:  Single Family Home Source of Information:  Patient Patient Interpreter Needed:  None Criminal Activity/Legal Involvement Pertinent to Current Situation/Hospitalization:  No - Comment as needed Significant Relationships:  Adult Children, Pets, Other Family Members Lives with:  Self Do you feel safe going back to the place where you live?  Yes Need for family participation in patient care:  Yes (Comment)  Care giving concerns:  No family at bedside.    Social Worker assessment / plan:  Pt hospitalized on 07/25/16 under observation status for UTI. CSW consulted for possible SNF placement. PN reviewed. Pt reports that he lives alone and his nephew checks in on him. " I'd like to get back home. My dog got loose when EMS picked me up. I'm worried about her. " Pt is willing to work with PT today and meet again with CSW when therapy is over. " I've been to those nursing homes a few times. The people there don't take care of you. I just as soon go home. " CSW will return once PT recommendations are available to assist with d/c planning.  Employment status:  Retired Nurse, adult, [employer] PT Recommendations:  Not assessed at this time Information / Referral to community resources:     Patient/Family's Response to care:  Disposition to be determined.  Patient/Family's Understanding of and Emotional Response to Diagnosis, Current Treatment, and Prognosis:  Pt reports that he is hospitalized because he fell. " I get up and I  think I'm OK but my legs won't do what they're supposed to do. It's very frustrating."  Support / reassurance provided. Emotional Assessment Appearance:  Appears stated age Attitude/Demeanor/Rapport:  Other (cooperative) Affect (typically observed):  Apprehensive Orientation:  Oriented to Self, Oriented to Place, Oriented to Situation Alcohol / Substance use:  Not Applicable Psych involvement (Current and /or in the community):  No (Comment)  Discharge Needs  Concerns to be addressed:  Discharge Planning Concerns Readmission within the last 30 days:  No Current discharge risk:  Physical Impairment, Lives alone Barriers to Discharge:  No Barriers Identified   Loraine Maple [phone] 07/26/2016, 12:26 PM

## 2016-07-26 NOTE — NC FL2 (Signed)
[place] LEVEL OF CARE SCREENING TOOL     IDENTIFICATION  Patient Name: Evan Charles Birthdate: 1946-01-27 Sex: male Admission Date (Current Location): 07/25/2016  [facility] and [place] Number:  Herbalist and Address:  [facility],  [place] [address], [place]      Provider Number: [id number]  Attending Physician Name and Address:  Cristal Ford, DO  Relative Name and Phone Number:       Current Level of Care: Hospital Recommended Level of Care: [place] Prior Approval Number:    Date Approved/Denied:   PASRR Number: [id number]  Discharge Plan: SNF    Current Diagnoses: Patient Active Problem List   Diagnosis Date Noted  . UTI (lower urinary tract infection) 07/25/2016  . Falls 07/25/2016  . Mild cognitive impairment 07/25/2016  . Elevated CK 07/25/2016  . 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 Immobility 03/20/2016  . Failure to thrive (0-17) 03/20/2016  . Generalized weakness 03/20/2016  . Failure to thrive in adult 03/20/2016  . Neuropathy ([place])   . Essential hypertension   . Physical deconditioning   . 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 GERD without esophagitis 12/19/2015  . Bilateral lower extremity edema 12/19/2015  . Intractable low back pain 02/14/2015  . Difficulty walking 02/14/2015  . Prostate cancer ([place]) 02/14/2015  . H/O: upper GI bleed   . Chronic back pain   . Visual hallucinations   . Psychoses     Orientation RESPIRATION BLADDER Height & Weight     Self, Place, Situation  Normal Continent Weight: 187 lb (84.8 kg) Height:  5\' 9"  (175.3 cm)  BEHAVIORAL SYMPTOMS/MOOD NEUROLOGICAL BOWEL NUTRITION STATUS  Other (Comment) (no behaviors)   Continent Diet  AMBULATORY STATUS COMMUNICATION OF NEEDS Skin   Extensive Assist Verbally Normal                       Personal Care Assistance Level of Assistance  Bathing, Feeding, Dressing Bathing  Assistance: Limited assistance Feeding assistance: Independent Dressing Assistance: Limited assistance     Functional Limitations Info             SPECIAL CARE FACTORS FREQUENCY  PT (By licensed PT), OT (By licensed OT)     PT Frequency: 5 x wk OT Frequency: 5 x wk            Contractures Contractures Info: Not present    Additional Factors Info  Code Status, Psychotropic, Allergies Code Status Info: Full             Current Medications (07/26/2016):  This is the current hospital active medication list Current Facility-Administered Medications  Medication Dose Route Frequency Provider Last Rate Last Dose  . acetaminophen (TYLENOL) tablet 650 mg  650 mg Oral Q6H PRN Karmen Bongo, MD       Or  . acetaminophen (TYLENOL) suppository 650 mg  650 mg Rectal Q6H PRN Karmen Bongo, MD      . bisacodyl (DULCOLAX) suppository 10 mg  10 mg Rectal Daily PRN Karmen Bongo, MD      . cefTRIAXone (ROCEPHIN) 1 g in dextrose 5 % 50 mL IVPB  1 g Intravenous Q24H Garnet Sierras, [facility]      . docusate sodium (COLACE) capsule 100 mg  100 mg Oral BID Karmen Bongo, MD   100 mg at 07/26/16 1007  . enoxaparin (LOVENOX) injection 40 mg  40 mg Subcutaneous Q24H Karmen Bongo, MD  40 mg at 07/25/16 2228  . hydrochlorothiazide (MICROZIDE) capsule 12.5 mg  12.5 mg Oral Daily Karmen Bongo, MD   12.5 mg at 07/26/16 1008  . lactated ringers infusion   Intravenous Continuous Karmen Bongo, MD 100 mL/hr at 07/26/16 1014    . meloxicam (MOBIC) tablet 7.5 mg  7.5 mg Oral Daily Karmen Bongo, MD   7.5 mg at 07/26/16 1012  . ondansetron (ZOFRAN) tablet 4 mg  4 mg Oral Q6H PRN Karmen Bongo, MD       Or  . ondansetron [facility]) injection 4 mg  4 mg Intravenous Q6H PRN Karmen Bongo, MD      . pantoprazole (PROTONIX) EC tablet 40 mg  40 mg Oral BID Karmen Bongo, MD   40 mg at 07/26/16 1007  . traMADol (ULTRAM) tablet 50 mg  50 mg Oral Q6H PRN Karmen Bongo, MD   50 mg at 07/26/16 1434  .  traZODone (DESYREL) tablet 50 mg  50 mg Oral QHS Karmen Bongo, MD   50 mg at 07/25/16 2228     Discharge Medications: Please see discharge summary for a list of discharge medications.  Relevant Imaging Results:  Relevant Lab Results:   Additional Information SS# [id number]  Sheryle Vice, Randall An, LCSW

## 2016-07-26 NOTE — Progress Notes (Addendum)
EDCM spoke to [place] with [place] who reports she is not the Education officer, museum for this patient. She reports Derald Macleod SW pager [phone] or phone [phone] ext [phone] is patient's SW.  [facility] paged Ms. Duncan awaiting call back. EDCM left voice message for Derald Macleod regarding possible home health needs and needing authorization for these services. Discussed patient with Unit CM Alinda Sierras.

## 2016-07-26 NOTE — Progress Notes (Addendum)
CSW met with pt at bedside to review PT recommendations ( SNF ). " I 'm not going to a nursing home. I'm going home. " Pt gave CSW permission to investigate options for SNF and is willing to meet with CSW again in the am. " I'll meet with you in the morning and tell you " no " again.  CSW will continue to follow to assist with d/c planning needs.  Evan Lean LCSW [phone]  [phone] CSW spoke with pt's daughter, Evan Charles [phone] for assistance with d/c planning. Daughter is unable to come to hospital but will speak with her father and encourage him to accept [facility] placement. CSW will speak with daughter again in the am.  Evan Lean LCSW [phone]

## 2016-07-26 NOTE — Progress Notes (Signed)
PROGRESS NOTE    Evan Charles  [id number] DOB: [birth year] DOA: 07/25/2016 PCP: PROVIDER NOT IN SYSTEM   Chief Complaint  Patient presents with  . Fall  . Weakness    Brief Narrative:  70 year old male history of prostate cancer, some type of mental illness or impairment, GERD, hypertension, presented to the emergency department with complaints of fall. PT consulted. Patient also found to have urinary tract infection. Assessment & Plan   Urinary tract infection -UA showed positive nitrites, small leukocytes, 6-30 WBCs and many bacteria -Continue ceftriaxone -Urine culture pending -Currently afebrile with no leukocytosis  Falls -PT consulted -patient would likely benefit from assisted living facility versus SNF -CT head/cervical spine: No acute traumatic finding  Rhabdomyolysis/elevated CPK level -Place patient on IV fluids, currently 1922 -Likely secondary to multiple falls -Continue to monitor   Mild cognitive impairment -He was a chronic issue. Patient was somewhat alert and oriented however kept telling me he was at home not in the hospital. -Question whether this is dementia.  Essential hypertension -Continue HCTZ  GERD -Continue Protonix  DVT Prophylaxis  Lovenox  Code Status: Full  Family Communication: None at bedside  Disposition Plan: In observation. Pending PT.  Continue IVF for rhabdo.  Consultants None  Procedures  None  Antibiotics   Anti-infectives    Start     Dose/Rate Route Frequency Ordered Stop   07/26/16 1800  cefTRIAXone (ROCEPHIN) 1 g in dextrose 5 % 50 mL IVPB     1 g 100 mL/hr over 30 Minutes Intravenous Every 24 hours 07/25/16 2035     07/25/16 1800  cefTRIAXone (ROCEPHIN) 1 g in dextrose 5 % 50 mL IVPB     1 g 100 mL/hr over 30 Minutes Intravenous  Once 07/25/16 1745 07/25/16 1832      Subjective:   Evan Charles seen and examined today.  Patient denies any chest pain, shortness of breath, abdominal pain, nausea or  vomiting, diarrhea constipation. Does remember he fell.   Objective:   Vitals:   07/25/16 1802 07/25/16 1830 07/25/16 2005 07/26/16 0510  BP: 129/92 (!) 126/103 (!) 138/92 (!) 143/95  Pulse: 92  98 94  Resp: 21 22 20 20   Temp:   98 F (36.7 C) 97.9 F (36.6 C)  TempSrc:   Oral Oral  SpO2: 100%  100% 99%  Weight:   84.8 kg (187 lb)   Height:   5\' 9"  (1.753 m)     Intake/Output Summary (Last 24 hours) at 07/26/16 1208 Last data filed at 07/26/16 0933  Gross per 24 hour  Intake              905 ml  Output                0 ml  Net              905 ml   Filed Weights   07/25/16 1557 07/25/16 2005  Weight: 81.6 kg (180 lb) 84.8 kg (187 lb)    Exam  General: Well developed, well nourished, NAD  HEENT: NCAT, mucous membranes moist.   Cardiovascular: S1 S2 auscultated, no rubs, murmurs or gallops. Regular rate and rhythm.  Respiratory: Clear to auscultation bilaterally with equal chest rise  Abdomen: Soft, nontender, nondistended, + bowel sounds  Extremities: warm dry without cyanosis clubbing or edema  Neuro: AAOx3 (to person, time, current events, not place). Nonfocal. Has tangential comments  Psych: appropriate mood and affect   Data Reviewed: I have personally  reviewed following labs and imaging studies  CBC:  Recent Labs Lab 07/25/16 1700 07/26/16 0344  WBC 8.3 6.3  NEUTROABS 6.1  --   HGB 12.6* 11.7*  HCT 39.1 36.7*  MCV 84.3 83.8  PLT 325 [id number]   Basic Metabolic Panel:  Recent Labs Lab 07/25/16 1700 07/26/16 0344  NA 140 141  K 3.6 3.4*  CL 106 108  CO2 24 26  GLUCOSE 80 112*  BUN 18 16  CREATININE 0.97 0.90  CALCIUM 9.1 9.2   GFR: Estimated Creatinine Clearance: 76.4 mL/min (by C-G formula based on SCr of 0.9 mg/dL). Liver Function Tests:  Recent Labs Lab 07/25/16 1700  AST 38  ALT 15*  ALKPHOS 48  BILITOT 0.9  PROT 7.4  ALBUMIN 4.4   No results for input(s): LIPASE, AMYLASE in the last 168 hours. No results for input(s):  AMMONIA in the last 168 hours. Coagulation Profile: No results for input(s): INR, PROTIME in the last 168 hours. Cardiac Enzymes:  Recent Labs Lab 07/25/16 1700 07/26/16 0344  CKTOTAL 1,769* 1,922*   BNP (last 3 results) No results for input(s): PROBNP in the last 8760 hours. HbA1C: No results for input(s): HGBA1C in the last 72 hours. CBG: No results for input(s): GLUCAP in the last 168 hours. Lipid Profile: No results for input(s): CHOL, HDL, LDLCALC, TRIG, CHOLHDL, LDLDIRECT in the last 72 hours. Thyroid Function Tests: No results for input(s): TSH, T4TOTAL, FREET4, T3FREE, THYROIDAB in the last 72 hours. Anemia Panel: No results for input(s): VITAMINB12, FOLATE, FERRITIN, TIBC, IRON, RETICCTPCT in the last 72 hours. Urine analysis:    Component Value Date/Time   COLORURINE YELLOW 07/25/2016 [place] 07/25/2016 1639   LABSPEC 1.026 07/25/2016 1639   PHURINE 5.0 07/25/2016 1639   GLUCOSEU NEGATIVE 07/25/2016 1639   HGBUR SMALL (A) 07/25/2016 1639   BILIRUBINUR NEGATIVE 07/25/2016 1639   KETONESUR 40 (A) 07/25/2016 1639   PROTEINUR NEGATIVE 07/25/2016 1639   UROBILINOGEN 0.2 02/13/2015 1957   NITRITE POSITIVE (A) 07/25/2016 1639   LEUKOCYTESUR SMALL (A) 07/25/2016 1639   Sepsis Labs: @LABRCNTIP (procalcitonin:4,lacticidven:4)  )No results found for this or any previous visit (from the past 240 hour(s)).    Radiology Studies: Ct Head Wo Contrast  Result Date: 07/25/2016 CLINICAL DATA:  [place] out of bit head with subsequent confusion and lower extremity weakness. EXAM: CT HEAD WITHOUT CONTRAST CT CERVICAL SPINE WITHOUT CONTRAST TECHNIQUE: Multidetector CT imaging of the head and cervical spine was performed following the standard protocol without intravenous contrast. Multiplanar CT image reconstructions of the cervical spine were also generated. COMPARISON:  03/20/2016.  02/11/2016. FINDINGS: CT HEAD FINDINGS Brain: Mild generalized atrophy. Chronic  appearing small vessel changes of the deep white matter. No sign of acute infarction, mass lesion, hemorrhage, hydrocephalus or extra-axial collection. Vascular: Normal Skull: No fracture or focal lesion. Sinuses/Orbits: Clear/normal Other: None CT CERVICAL SPINE FINDINGS Alignment: Straightening of the cervical lordosis. Skull base and vertebrae: Skullbase is normal. Distant fusion at C5-6. No acute fracture. 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 C2-3: Facet degeneration right worse than left. Foraminal stenosis on the right. C3-4: Spondylosis and facet arthropathy worse on the left. Moderate canal stenosis. Bilateral foraminal stenosis left worse than right. C4-5: Spondylosis and mild facet degeneration. Mild central canal stenosis. Bilateral foraminal stenosis. C5-6: Distant fusion is solid. Canal sufficiently patent. Chronic bilateral foraminal narrowing. C6-7: Degenerative spondylosis. Moderate canal stenosis. Left-sided predominant facet arthropathy. Foraminal narrowing left worse than right.  C7-T1: Facet arthropathy worse on the left. No central canal stenosis. Mild left foraminal stenosis. Upper chest: Negative Other: None IMPRESSION: Head CT: No acute or traumatic finding. Atrophy and chronic small vessel change of the deep white matter. Cervical spine CT: No acute or traumatic finding. Old fusion C5-6. Degenerative spondylosis and facet arthropathy above and below as outlined above. Multiple levels of foraminal stenosis and moderate canal stenosis. Electronically Signed   By: Nelson Chimes M.D.   On: 07/25/2016 16:28   Ct Cervical Spine Wo Contrast  Result Date: 07/25/2016 CLINICAL DATA:  [place] out of bit head with subsequent confusion and lower extremity weakness. EXAM: CT HEAD WITHOUT CONTRAST CT CERVICAL SPINE WITHOUT CONTRAST TECHNIQUE: Multidetector CT imaging of the head and cervical spine was performed following the standard protocol without  intravenous contrast. Multiplanar CT image reconstructions of the cervical spine were also generated. COMPARISON:  03/20/2016.  02/11/2016. FINDINGS: CT HEAD FINDINGS Brain: Mild generalized atrophy. Chronic appearing small vessel changes of the deep white matter. No sign of acute infarction, mass lesion, hemorrhage, hydrocephalus or extra-axial collection. Vascular: Normal Skull: No fracture or focal lesion. Sinuses/Orbits: Clear/normal Other: None CT CERVICAL SPINE FINDINGS Alignment: Straightening of the cervical lordosis. Skull base and vertebrae: Skullbase is normal. Distant fusion at C5-6. No acute fracture. 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 C2-3: Facet degeneration right worse than left. Foraminal stenosis on the right. C3-4: Spondylosis and facet arthropathy worse on the left. Moderate canal stenosis. Bilateral foraminal stenosis left worse than right. C4-5: Spondylosis and mild facet degeneration. Mild central canal stenosis. Bilateral foraminal stenosis. C5-6: Distant fusion is solid. Canal sufficiently patent. Chronic bilateral foraminal narrowing. C6-7: Degenerative spondylosis. Moderate canal stenosis. Left-sided predominant facet arthropathy. Foraminal narrowing left worse than right. C7-T1: Facet arthropathy worse on the left. No central canal stenosis. Mild left foraminal stenosis. Upper chest: Negative Other: None IMPRESSION: Head CT: No acute or traumatic finding. Atrophy and chronic small vessel change of the deep white matter. Cervical spine CT: No acute or traumatic finding. Old fusion C5-6. Degenerative spondylosis and facet arthropathy above and below as outlined above. Multiple levels of foraminal stenosis and moderate canal stenosis. Electronically Signed   By: Nelson Chimes M.D.   On: 07/25/2016 16:28   Dg Chest Port 1 View  Result Date: 07/25/2016 CLINICAL DATA:  Fall yesterday, history prostate cancer EXAM: PORTABLE CHEST 1 VIEW  COMPARISON:  02/11/2016 FINDINGS: Cardiomediastinal silhouette is stable. Elevation of the left hemidiaphragm again noted. Stable left basilar atelectasis or scarring. No acute infiltrate or pulmonary edema. No pneumothorax. IMPRESSION: No active disease. Electronically Signed   By: Lahoma Crocker M.D.   On: 07/25/2016 16:07     Scheduled Meds: . cefTRIAXone (ROCEPHIN)  IV  1 g Intravenous Q24H  . docusate sodium  100 mg Oral BID  . enoxaparin (LOVENOX) injection  40 mg Subcutaneous Q24H  . hydrochlorothiazide  12.5 mg Oral Daily  . meloxicam  7.5 mg Oral Daily  . pantoprazole  40 mg Oral BID  . traZODone  50 mg Oral QHS   Continuous Infusions: . lactated ringers 100 mL/hr at 07/26/16 1014     LOS: 0 days   Time Spent in minutes   30 minutes  Lizmarie Witters D.O. on 07/26/2016 at 12:08 PM  Between 7am to 7pm - Pager - [phone]  After 7pm go to [email or web address] - password [email or web address]  And look for the night coverage person covering for me after hours  [place]  [phone]

## 2016-07-26 NOTE — Evaluation (Signed)
Physical Therapy Evaluation Patient Details Name: Evan Charles MRN: [id number] DOB: 06-14-1946 Today's Date: 07/26/2016   History of Present Illness  Pt is a 70 y/o male with history of prostate cancer, cognitive impairment, GERD, and hypertension admitted after falling at home. Pt also found to have UTI.  Clinical Impression  Pt admitted with above. Pt currently with functional limitations due to the deficits listed below (see PT Problem List). Pt will benefit from skilled PT to increase his independence and safety with mobility to allow discharge. Pt demonstrated poor standing balance during functional activity (sit-to-stand and stand pivot transfer to bed) and required 2+ assistance and frequent verbal cueing to correct posterior lean and perform standing balance and transfer safely. Pt also required frequent verbal and tactile cues to maintain sitting balance. Pt was wheezing throughout session, SpO2 was checked post-activity and read 98% on room air. Recommending SNF due to weakness, poor motor coordination, and decreased safety awareness.    Follow Up Recommendations SNF    Equipment Recommendations  None recommended by PT    Recommendations for Other Services       Precautions / Restrictions Precautions Precautions: Fall Restrictions Weight Bearing Restrictions: No      Mobility  Bed Mobility Overal bed mobility: Needs Assistance Bed Mobility: Supine to Sit     Supine to sit: Mod assist     General bed mobility comments: assistance for LEs and trunk  Transfers Overall transfer level: Needs assistance Equipment used: Rolling walker (2 wheeled) Transfers: Stand Pivot Transfers;Sit to/from Stand Sit to Stand: Max assist;+2 physical assistance Stand pivot transfers: +2 physical assistance;Max assist       General transfer comment: pt had difficulty controlling rise and descent; "sprung" up from bed and lacked eccentric control when sitting in chair; assistance for  safety, RW placement, and support; verbal cues for safety, RW placement and sequencing during transfer  Ambulation/Gait                Stairs            Wheelchair Mobility    Modified Rankin (Stroke Patients Only)       Balance Overall balance assessment: Needs assistance Sitting-balance support: Single extremity supported Sitting balance-Leahy Scale: Fair Sitting balance - Comments: pt required min guard for safety and frequent verbal and tactile cues to keep feet on floor with knees bent for support and to sit upright on EOB Postural control: Posterior lean Standing balance support: During functional activity Standing balance-Leahy Scale: Poor Standing balance comment: pt required +2 assistance for standing balance with RW; required frequent verbal cues to keep feet positioned under center of mass to maintain balance and correct posterior lean                             Pertinent Vitals/Pain Pain Assessment: No/denies pain    Home Living Family/patient expects to be discharged to:: Private residence Living Arrangements: Alone Available Help at Discharge: Family;Available PRN/intermittently Type of Home: House Home Access: Stairs to enter Entrance Stairs-Rails: Chemical engineer of Steps: 4 Home Layout: One level Home Equipment: Walker - 4 wheels Additional Comments: Pt is a poor historian, unable to determine reliability of information provided.    Prior Function Level of Independence: Needs assistance   Gait / Transfers Assistance Needed: Ambulating w/ RW; reports several falls in the past month           Hand Dominance  Extremity/Trunk Assessment               Lower Extremity Assessment: Generalized weakness (pt also demonstrated decreased motor coordination)         Communication   Communication: Other (comment);Expressive difficulties (possibly due to cognitive impairment)  Cognition  Arousal/Alertness: Awake/alert Behavior During Therapy: WFL for tasks assessed/performed Overall Cognitive Status: History of cognitive impairments - at baseline (no family present)                      General Comments      Exercises        Assessment/Plan    PT Assessment Patient needs continued PT services  PT Diagnosis Difficulty walking;Generalized weakness   PT Problem List Decreased strength;Decreased mobility;Decreased balance;Decreased coordination;Decreased safety awareness  PT Treatment Interventions DME instruction;Gait training;Functional mobility training;Therapeutic activities;Therapeutic exercise;Balance training;Patient/family education   PT Goals (Current goals can be found in the Care Plan section) Acute Rehab PT Goals PT Goal Formulation: With patient Time For Goal Achievement: 08/02/16 Potential to Achieve Goals: Fair    Frequency Min 3X/week   Barriers to discharge        Co-evaluation               End of Session Equipment Utilized During Treatment: Gait belt Activity Tolerance: Patient tolerated treatment well Patient left: in chair;with call bell/phone within reach;with chair alarm set Nurse Communication: Other (comment) (left note for RN for stedy back to bed (RN at lunch))         Time: [phone] PT Time Calculation (min) (ACUTE ONLY): 26 min   Charges:   PT Evaluation $PT Eval Moderate Complexity: 1 Procedure PT Treatments $Therapeutic Activity: 8-22 mins   PT G CodesDewitt Hoes 2016/07/28, 4:11 PM Dewitt Hoes, SPT

## 2016-07-27 DIAGNOSIS — K922 Gastrointestinal hemorrhage, unspecified: Secondary | ICD-10-CM | POA: Diagnosis not present

## 2016-07-27 DIAGNOSIS — I10 Essential (primary) hypertension: Secondary | ICD-10-CM | POA: Diagnosis not present

## 2016-07-27 DIAGNOSIS — M545 Low back pain: Secondary | ICD-10-CM | POA: Diagnosis not present

## 2016-07-27 DIAGNOSIS — M810 Age-related osteoporosis without current pathological fracture: Secondary | ICD-10-CM | POA: Diagnosis not present

## 2016-07-27 DIAGNOSIS — D500 Iron deficiency anemia secondary to blood loss (chronic): Secondary | ICD-10-CM | POA: Diagnosis not present

## 2016-07-27 DIAGNOSIS — R269 Unspecified abnormalities of gait and mobility: Secondary | ICD-10-CM | POA: Diagnosis not present

## 2016-07-27 DIAGNOSIS — K5900 Constipation, unspecified: Secondary | ICD-10-CM | POA: Diagnosis not present

## 2016-07-27 DIAGNOSIS — M6281 Muscle weakness (generalized): Secondary | ICD-10-CM | POA: Diagnosis not present

## 2016-07-27 DIAGNOSIS — L709 Acne, unspecified: Secondary | ICD-10-CM | POA: Diagnosis not present

## 2016-07-27 DIAGNOSIS — R296 Repeated falls: Secondary | ICD-10-CM | POA: Diagnosis not present

## 2016-07-27 DIAGNOSIS — G3184 Mild cognitive impairment, so stated: Secondary | ICD-10-CM | POA: Diagnosis not present

## 2016-07-27 DIAGNOSIS — R262 Difficulty in walking, not elsewhere classified: Secondary | ICD-10-CM | POA: Diagnosis not present

## 2016-07-27 DIAGNOSIS — N390 Urinary tract infection, site not specified: Secondary | ICD-10-CM | POA: Diagnosis not present

## 2016-07-27 DIAGNOSIS — K219 Gastro-esophageal reflux disease without esophagitis: Secondary | ICD-10-CM | POA: Diagnosis not present

## 2016-07-27 DIAGNOSIS — C61 Malignant neoplasm of prostate: Secondary | ICD-10-CM | POA: Diagnosis not present

## 2016-07-27 DIAGNOSIS — K921 Melena: Secondary | ICD-10-CM | POA: Diagnosis not present

## 2016-07-27 DIAGNOSIS — R531 Weakness: Secondary | ICD-10-CM | POA: Diagnosis not present

## 2016-07-27 LAB — BASIC METABOLIC PANEL
Anion gap: 6 (ref 5–15)
BUN: 10 mg/dL (ref 6–20)
CALCIUM: 8.7 mg/dL — AB (ref 8.9–10.3)
CO2: 29 mmol/L (ref 22–32)
Chloride: 103 mmol/L (ref 101–111)
Creatinine, Ser: 0.89 mg/dL (ref 0.61–1.24)
GFR calc Af Amer: >60 mL/min (ref >60)
GLUCOSE: 110 mg/dL — AB (ref 65–99)
Potassium: 4.2 mmol/L (ref 3.5–5.1)
SODIUM: 138 mmol/L (ref 135–145)

## 2016-07-27 LAB — CBC
HCT: 38.4 % — ABNORMAL LOW (ref 39.0–52.0)
Hemoglobin: 12.5 g/dL — ABNORMAL LOW (ref 13.0–17.0)
MCH: 27.6 pg (ref 26.0–34.0)
MCHC: 32.6 g/dL (ref 30.0–36.0)
MCV: 84.8 fL (ref 78.0–100.0)
PLATELETS: 270 10*3/uL (ref 150–400)
RBC: 4.53 MIL/uL (ref 4.22–5.81)
RDW: 15.3 % (ref 11.5–15.5)
WBC: 7.8 10*3/uL (ref 4.0–10.5)

## 2016-07-27 LAB — CK: CK TOTAL: 1215 U/L — AB (ref 49–397)

## 2016-07-27 MED ORDER — TRAZODONE HCL 50 MG PO TABS: 50.0000 mg | ORAL_TABLET | Freq: Every day | ORAL | 0 refills | Status: DC

## 2016-07-27 MED ORDER — TRAMADOL HCL 50 MG PO TABS: 50.0000 mg | ORAL_TABLET | Freq: Four times a day (QID) | ORAL | 0 refills | Status: DC | PRN

## 2016-07-27 NOTE — Progress Notes (Signed)
Report given to Liliane Channel, nurse at [facility].

## 2016-07-27 NOTE — Progress Notes (Signed)
CSW assisting with d/c planning. CSW continue to try to reach pt's daughter [name] and nephew, [name], without success. Pt continues to decline SNF placement. MD is aware and will speak with pt this afternoon. CSW will continue to try to reach family for assistance with d/c planning. Pt continues to report that he can manage at home by himself. CSW and PT encouraged pt to show [place] how he would get out of bed by himself and use the bathroom. Pt was unable to sit up in bed. Still pt insisted he would be able to manage by himself at home. Pt lacks insight regarding his medical condition / care needs. Pt reports, " I'm stubborn and I know what I want. I want to go home. " Pt's daughter, [name], reported yesterday that pt was in rehab at [facility] and she would like to see him go back there for rehab. [facility] has been contacted and SNF has an opening for him today. CSW is unable to d/c pt to SNF without his approval or family's assistance.   Werner Lean LCSW [phone]

## 2016-07-27 NOTE — Progress Notes (Signed)
Attempted 3 x to call [facility] care for report, nurse unable to take report at this time.Left a number for them to call this Probation officer.

## 2016-07-27 NOTE — Progress Notes (Signed)
CSW assisting with d/c planning. Pt is ready for d/c today. Pt continues to decline SNF placement. Message left for pt's daughter, [name], to contact CSW for assistance with d/c planning. Awaiting return call. MD updated.  Werner Lean LCSW [phone]

## 2016-07-27 NOTE — Progress Notes (Signed)
Called no emergency ambulance to transfer patient to [facility].

## 2016-07-27 NOTE — Discharge Summary (Signed)
Discharge Summary  Evan Charles [id number] DOB: 07/01/1946  PCP: PROVIDER NOT IN SYSTEM  Admit date: 07/25/2016 Discharge date: 07/27/2016   Recommendations for Outpatient Follow-up:  1. PCP 1-2 weeks   Discharge Diagnoses:  Active Hospital Problems   Diagnosis Date Noted  . UTI (lower urinary tract infection) 07/25/2016  . UTI (urinary tract infection) 07/26/2016  . Falls 07/25/2016  . Mild cognitive impairment 07/25/2016  . Elevated CK 07/25/2016  . Essential hypertension   . GERD without esophagitis 12/19/2015    Resolved Hospital Problems   Diagnosis Date Noted Date Resolved  No resolved problems to display.    Discharge Condition: Stable   Diet recommendation: Regular   Vitals:   07/26/16 1351 07/27/16 0639  BP: 124/80   Pulse:  85  Resp:    Temp:      HPI/Hospital course:  70 year old male history of prostate cancer, some type of mental illness or impairment, GERD, hypertension, presented to the emergency department with complaints of fall. PT consulted. Patient also found to have urinary tract infection, treated with 3 days of Rocephin. Was seen by PT recommend SNF and he will be discharged to SNF today.  Hospital Course:  Principal Problem:   UTI (lower urinary tract infection) Active Problems:   GERD without esophagitis   Essential hypertension   Falls   Mild cognitive impairment   Elevated CK   UTI (urinary tract infection)  Urinary tract infection -UA showed positive nitrites, small leukocytes, 6-30 WBCs and many bacteria -treated with ceftriaxone in the hospital -Urine culture pending - considering he has had no dysuria, no fevers, no white count, will not continue abx therapy beyond hospital stay  Falls -PT consulted -patient would benefit from SNF -CT head/cervical spine: No acute traumatic finding  Rhabdomyolysis/elevated CPK level -Place patient on IV fluids, CPK improving - renal function not affected, has good UOP -Likely  secondary to multiple falls -Continue to monitor   Mild cognitive impairment -He was a chronic issue. Patient was somewhat alert and oriented, but at times confused about where he is and likely not competent to make decision - plan was discussed by previous MD with daughter who agrees with plans for SNF discharge today -Question whether this is dementia.  Essential hypertension -Continue HCTZ  GERD -Continue Protonix  Procedures:  None   Consultations:  None   Discharge Exam: BP 124/80 (BP Location: Right Arm)   Pulse 85   Temp 97.8 F (36.6 C) (Oral)   Resp 20   Ht 5\' 9"  (1.753 m)   Wt 84.8 kg (187 lb)   SpO2 96%   BMI 27.62 kg/m  General:  Alert, calm, in no acute distress  Eyes: EOMI, clear sclerea Neck: supple, no masses, trachea mildline  Cardiovascular: RRR, no murmurs or rubs, no peripheral edema  Respiratory: clear to auscultation bilaterally, no wheezes, no crackles  Abdomen: soft, nontender, nondistended, normal bowel tones heard  Skin: dry, no rashes  Musculoskeletal: no joint effusions, normal range of motion  Psychiatric: appropriate affect, normal speech  Neurologic: extraocular muscles intact, clear speech, moving all extremities with intact sensorium    Discharge Instructions You were cared for by a hospitalist during your hospital stay. If you have any questions about your discharge medications or the care you received while you were in the hospital after you are discharged, you can call the unit and asked to speak with the hospitalist on call if the hospitalist that took care of you is not available. Once  you are discharged, your primary care physician will handle any further medical issues. Please note that NO REFILLS for any discharge medications will be authorized once you are discharged, as it is imperative that you return to your primary care physician (or establish a relationship with a primary care physician if you do not have one) for your  aftercare needs so that they can reassess your need for medications and monitor your lab values.  Discharge Instructions    Diet - low sodium heart healthy    Complete by:  As directed    Increase activity slowly    Complete by:  As directed        Medication List    TAKE these medications   bisacodyl 10 MG suppository Commonly known as:  DULCOLAX Place 1 suppository (10 mg total) rectally daily as needed for moderate constipation.   docusate sodium 100 MG capsule Commonly known as:  COLACE Take 1 capsule (100 mg total) by mouth 2 (two) times daily.   hydrochlorothiazide 12.5 MG capsule Commonly known as:  MICROZIDE Take 1 capsule (12.5 mg total) by mouth daily.   meloxicam 7.5 MG tablet Commonly known as:  MOBIC Take 1 tablet (7.5 mg total) by mouth daily.   pantoprazole 40 MG tablet Commonly known as:  PROTONIX Take 1 tablet (40 mg total) by mouth 2 (two) times daily.   traMADol 50 MG tablet Commonly known as:  ULTRAM Take 1 tablet (50 mg total) by mouth every 6 (six) hours as needed for moderate pain.   traZODone 50 MG tablet Commonly known as:  DESYREL Take 1 tablet (50 mg total) by mouth at bedtime.      Allergies  Allergen Reactions  . Tomato Itching and Rash      The results of significant diagnostics from this hospitalization (including imaging, microbiology, ancillary and laboratory) are listed below for reference.    Significant Diagnostic Studies: Ct Head Wo Contrast  Result Date: 07/25/2016 CLINICAL DATA:  [place] out of bit head with subsequent confusion and lower extremity weakness. EXAM: CT HEAD WITHOUT CONTRAST CT CERVICAL SPINE WITHOUT CONTRAST TECHNIQUE: Multidetector CT imaging of the head and cervical spine was performed following the standard protocol without intravenous contrast. Multiplanar CT image reconstructions of the cervical spine were also generated. COMPARISON:  03/20/2016.  02/11/2016. FINDINGS: CT HEAD FINDINGS Brain: Mild  generalized atrophy. Chronic appearing small vessel changes of the deep white matter. No sign of acute infarction, mass lesion, hemorrhage, hydrocephalus or extra-axial collection. Vascular: Normal Skull: No fracture or focal lesion. Sinuses/Orbits: Clear/normal Other: None CT CERVICAL SPINE FINDINGS Alignment: Straightening of the cervical lordosis. Skull base and vertebrae: Skullbase is normal. Distant fusion at C5-6. No acute fracture. 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 C2-3: Facet degeneration right worse than left. Foraminal stenosis on the right. C3-4: Spondylosis and facet arthropathy worse on the left. Moderate canal stenosis. Bilateral foraminal stenosis left worse than right. C4-5: Spondylosis and mild facet degeneration. Mild central canal stenosis. Bilateral foraminal stenosis. C5-6: Distant fusion is solid. Canal sufficiently patent. Chronic bilateral foraminal narrowing. C6-7: Degenerative spondylosis. Moderate canal stenosis. Left-sided predominant facet arthropathy. Foraminal narrowing left worse than right. C7-T1: Facet arthropathy worse on the left. No central canal stenosis. Mild left foraminal stenosis. Upper chest: Negative Other: None IMPRESSION: Head CT: No acute or traumatic finding. Atrophy and chronic small vessel change of the deep white matter. Cervical spine CT: No acute or traumatic finding. Old fusion C5-6. Degenerative  spondylosis and facet arthropathy above and below as outlined above. Multiple levels of foraminal stenosis and moderate canal stenosis. Electronically Signed   By: Nelson Chimes M.D.   On: 07/25/2016 16:28   Ct Cervical Spine Wo Contrast  Result Date: 07/25/2016 CLINICAL DATA:  [place] out of bit head with subsequent confusion and lower extremity weakness. EXAM: CT HEAD WITHOUT CONTRAST CT CERVICAL SPINE WITHOUT CONTRAST TECHNIQUE: Multidetector CT imaging of the head and cervical spine was performed following the  standard protocol without intravenous contrast. Multiplanar CT image reconstructions of the cervical spine were also generated. COMPARISON:  03/20/2016.  02/11/2016. FINDINGS: CT HEAD FINDINGS Brain: Mild generalized atrophy. Chronic appearing small vessel changes of the deep white matter. No sign of acute infarction, mass lesion, hemorrhage, hydrocephalus or extra-axial collection. Vascular: Normal Skull: No fracture or focal lesion. Sinuses/Orbits: Clear/normal Other: None CT CERVICAL SPINE FINDINGS Alignment: Straightening of the cervical lordosis. Skull base and vertebrae: Skullbase is normal. Distant fusion at C5-6. No acute fracture. 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 C2-3: Facet degeneration right worse than left. Foraminal stenosis on the right. C3-4: Spondylosis and facet arthropathy worse on the left. Moderate canal stenosis. Bilateral foraminal stenosis left worse than right. C4-5: Spondylosis and mild facet degeneration. Mild central canal stenosis. Bilateral foraminal stenosis. C5-6: Distant fusion is solid. Canal sufficiently patent. Chronic bilateral foraminal narrowing. C6-7: Degenerative spondylosis. Moderate canal stenosis. Left-sided predominant facet arthropathy. Foraminal narrowing left worse than right. C7-T1: Facet arthropathy worse on the left. No central canal stenosis. Mild left foraminal stenosis. Upper chest: Negative Other: None IMPRESSION: Head CT: No acute or traumatic finding. Atrophy and chronic small vessel change of the deep white matter. Cervical spine CT: No acute or traumatic finding. Old fusion C5-6. Degenerative spondylosis and facet arthropathy above and below as outlined above. Multiple levels of foraminal stenosis and moderate canal stenosis. Electronically Signed   By: Nelson Chimes M.D.   On: 07/25/2016 16:28   Dg Chest Port 1 View  Result Date: 07/25/2016 CLINICAL DATA:  Fall yesterday, history prostate cancer EXAM:  PORTABLE CHEST 1 VIEW COMPARISON:  02/11/2016 FINDINGS: Cardiomediastinal silhouette is stable. Elevation of the left hemidiaphragm again noted. Stable left basilar atelectasis or scarring. No acute infiltrate or pulmonary edema. No pneumothorax. IMPRESSION: No active disease. Electronically Signed   By: Lahoma Crocker M.D.   On: 07/25/2016 16:07    Microbiology: No results found for this or any previous visit (from the past 240 hour(s)).   Labs: Basic Metabolic Panel:  Recent Labs Lab 07/25/16 1700 07/26/16 0344 07/27/16 0417  NA 140 141 138  K 3.6 3.4* 4.2  CL 106 108 103  CO2 24 26 29   GLUCOSE 80 112* 110*  BUN 18 16 10   CREATININE 0.97 0.90 0.89  CALCIUM 9.1 9.2 8.7*   Liver Function Tests:  Recent Labs Lab 07/25/16 1700  AST 38  ALT 15*  ALKPHOS 48  BILITOT 0.9  PROT 7.4  ALBUMIN 4.4   No results for input(s): LIPASE, AMYLASE in the last 168 hours. No results for input(s): AMMONIA in the last 168 hours. CBC:  Recent Labs Lab 07/25/16 1700 07/26/16 0344 07/27/16 0417  WBC 8.3 6.3 7.8  NEUTROABS 6.1  --   --   HGB 12.6* 11.7* 12.5*  HCT 39.1 36.7* 38.4*  MCV 84.3 83.8 84.8  PLT 325 314 270   Cardiac Enzymes:  Recent Labs Lab 07/25/16 1700 07/26/16 0344 07/27/16 0417  CKTOTAL 1,769*  1,922* 1,215*   BNP: BNP (last 3 results)  Recent Labs  12/11/15 1424  BNP 15.4    ProBNP (last 3 results) No results for input(s): PROBNP in the last 8760 hours.  CBG: No results for input(s): GLUCAP in the last 168 hours.  Time spent: 41 minutes were spent in preparing this discharge including medication reconciliation, counseling, and coordination of care.  Signed:  [employer]  [facility] 07/27/2016, 11:56 AM

## 2016-07-27 NOTE — Progress Notes (Signed)
Patient d/c to [facility] via non emergency ambulance. Patient is stable on d/c.

## 2016-07-27 NOTE — Progress Notes (Signed)
CSW spoke with pt's daughter, [name], to assist with d/c planning. [name] spoke with her father and told him he needs to go to [facility]. Pt continues to state he wants to go home. [name] has instructed CSW to send pt to SNF. [facility] contacted and updated. D/CSummary has been sent to SNF for review. Scripts included in d/c packet. [facility] transport required. Medical necessity form completed. # for report provided to nsg.  Werner Lean LCSW [phone]

## 2016-07-28 LAB — URINE CULTURE: Culture: >=100000 — AB

## 2016-08-02 DIAGNOSIS — R269 Unspecified abnormalities of gait and mobility: Secondary | ICD-10-CM | POA: Diagnosis not present

## 2016-08-02 DIAGNOSIS — I10 Essential (primary) hypertension: Secondary | ICD-10-CM | POA: Diagnosis not present

## 2016-08-02 DIAGNOSIS — M545 Low back pain: Secondary | ICD-10-CM | POA: Diagnosis not present

## 2016-09-07 ENCOUNTER — Emergency Department (HOSPITAL_COMMUNITY): Payer: Medicare Other

## 2016-09-07 ENCOUNTER — Encounter (HOSPITAL_COMMUNITY): Payer: Self-pay

## 2016-09-07 ENCOUNTER — Inpatient Hospital Stay (HOSPITAL_COMMUNITY)
Admission: EM | Admit: 2016-09-07 | Discharge: 2016-09-13 | DRG: 557 | Disposition: A | Discharge status: Skilled Nursing Facility | Payer: Medicare Other | Attending: Internal Medicine | Admitting: Internal Medicine

## 2016-09-07 DIAGNOSIS — F0391 Unspecified dementia with behavioral disturbance: Secondary | ICD-10-CM | POA: Diagnosis not present

## 2016-09-07 DIAGNOSIS — R296 Repeated falls: Secondary | ICD-10-CM | POA: Diagnosis not present

## 2016-09-07 DIAGNOSIS — D509 Iron deficiency anemia, unspecified: Secondary | ICD-10-CM | POA: Diagnosis not present

## 2016-09-07 DIAGNOSIS — G9341 Metabolic encephalopathy: Secondary | ICD-10-CM | POA: Clinically undetermined

## 2016-09-07 DIAGNOSIS — S0990XA Unspecified injury of head, initial encounter: Secondary | ICD-10-CM | POA: Diagnosis not present

## 2016-09-07 DIAGNOSIS — R627 Adult failure to thrive: Secondary | ICD-10-CM | POA: Diagnosis not present

## 2016-09-07 DIAGNOSIS — I10 Essential (primary) hypertension: Secondary | ICD-10-CM | POA: Diagnosis not present

## 2016-09-07 DIAGNOSIS — R531 Weakness: Secondary | ICD-10-CM

## 2016-09-07 DIAGNOSIS — D508 Other iron deficiency anemias: Secondary | ICD-10-CM | POA: Diagnosis not present

## 2016-09-07 DIAGNOSIS — Z91018 Allergy to other foods: Secondary | ICD-10-CM | POA: Diagnosis not present

## 2016-09-07 DIAGNOSIS — M7989 Other specified soft tissue disorders: Secondary | ICD-10-CM | POA: Diagnosis not present

## 2016-09-07 DIAGNOSIS — R600 Localized edema: Secondary | ICD-10-CM | POA: Diagnosis not present

## 2016-09-07 DIAGNOSIS — K219 Gastro-esophageal reflux disease without esophagitis: Secondary | ICD-10-CM | POA: Diagnosis present

## 2016-09-07 DIAGNOSIS — Z23 Encounter for immunization: Secondary | ICD-10-CM | POA: Diagnosis not present

## 2016-09-07 DIAGNOSIS — Z8546 Personal history of malignant neoplasm of prostate: Secondary | ICD-10-CM

## 2016-09-07 DIAGNOSIS — D649 Anemia, unspecified: Secondary | ICD-10-CM | POA: Diagnosis present

## 2016-09-07 DIAGNOSIS — F28 Other psychotic disorder not due to a substance or known physiological condition: Secondary | ICD-10-CM | POA: Diagnosis present

## 2016-09-07 DIAGNOSIS — Z808 Family history of malignant neoplasm of other organs or systems: Secondary | ICD-10-CM | POA: Diagnosis not present

## 2016-09-07 DIAGNOSIS — S3992XA Unspecified injury of lower back, initial encounter: Secondary | ICD-10-CM | POA: Diagnosis not present

## 2016-09-07 DIAGNOSIS — M545 Low back pain: Secondary | ICD-10-CM | POA: Diagnosis not present

## 2016-09-07 DIAGNOSIS — F0390 Unspecified dementia without behavioral disturbance: Secondary | ICD-10-CM | POA: Diagnosis not present

## 2016-09-07 DIAGNOSIS — Z87891 Personal history of nicotine dependence: Secondary | ICD-10-CM | POA: Diagnosis not present

## 2016-09-07 DIAGNOSIS — M6282 Rhabdomyolysis: Secondary | ICD-10-CM | POA: Diagnosis not present

## 2016-09-07 DIAGNOSIS — S199XXA Unspecified injury of neck, initial encounter: Secondary | ICD-10-CM | POA: Diagnosis not present

## 2016-09-07 DIAGNOSIS — F0392 Unspecified dementia, unspecified severity, with psychotic disturbance: Secondary | ICD-10-CM | POA: Diagnosis present

## 2016-09-07 DIAGNOSIS — R4182 Altered mental status, unspecified: Secondary | ICD-10-CM

## 2016-09-07 DIAGNOSIS — Y92009 Unspecified place in unspecified non-institutional (private) residence as the place of occurrence of the external cause: Secondary | ICD-10-CM

## 2016-09-07 DIAGNOSIS — Z79899 Other long term (current) drug therapy: Secondary | ICD-10-CM

## 2016-09-07 DIAGNOSIS — R410 Disorientation, unspecified: Secondary | ICD-10-CM | POA: Diagnosis present

## 2016-09-07 DIAGNOSIS — W19XXXA Unspecified fall, initial encounter: Secondary | ICD-10-CM

## 2016-09-07 DIAGNOSIS — T796XXS Traumatic ischemia of muscle, sequela: Secondary | ICD-10-CM | POA: Diagnosis not present

## 2016-09-07 DIAGNOSIS — G9340 Encephalopathy, unspecified: Secondary | ICD-10-CM | POA: Diagnosis not present

## 2016-09-07 DIAGNOSIS — G8929 Other chronic pain: Secondary | ICD-10-CM | POA: Diagnosis not present

## 2016-09-07 LAB — COMPREHENSIVE METABOLIC PANEL
ALBUMIN: 4 g/dL (ref 3.5–5.0)
ALK PHOS: 45 U/L (ref 38–126)
ALT: 18 U/L (ref 17–63)
AST: 35 U/L (ref 15–41)
Anion gap: 10 (ref 5–15)
BILIRUBIN TOTAL: 0.9 mg/dL (ref 0.3–1.2)
BUN: 13 mg/dL (ref 6–20)
CO2: 26 mmol/L (ref 22–32)
Calcium: 9.2 mg/dL (ref 8.9–10.3)
Chloride: 105 mmol/L (ref 101–111)
Creatinine, Ser: 0.76 mg/dL (ref 0.61–1.24)
GFR calc Af Amer: >60 mL/min (ref >60)
GFR calc non Af Amer: >60 mL/min (ref >60)
GLUCOSE: 90 mg/dL (ref 65–99)
POTASSIUM: 4 mmol/L (ref 3.5–5.1)
Sodium: 141 mmol/L (ref 135–145)
TOTAL PROTEIN: 7 g/dL (ref 6.5–8.1)

## 2016-09-07 LAB — URINALYSIS, ROUTINE W REFLEX MICROSCOPIC
GLUCOSE, UA: NEGATIVE mg/dL
HGB URINE DIPSTICK: NEGATIVE
KETONES UR: 40 mg/dL — AB
Leukocytes, UA: NEGATIVE
Nitrite: NEGATIVE
PROTEIN: NEGATIVE mg/dL
Specific Gravity, Urine: 1.023 (ref 1.005–1.030)
pH: 6 (ref 5.0–8.0)

## 2016-09-07 LAB — CBC WITH DIFFERENTIAL/PLATELET
BASOS ABS: 0 10*3/uL (ref 0.0–0.1)
Basophils Relative: 0 %
EOS PCT: 1 %
Eosinophils Absolute: 0.1 10*3/uL (ref 0.0–0.7)
HCT: 38.2 % — ABNORMAL LOW (ref 39.0–52.0)
Hemoglobin: 12.7 g/dL — ABNORMAL LOW (ref 13.0–17.0)
LYMPHS ABS: 1.1 10*3/uL (ref 0.7–4.0)
LYMPHS PCT: 13 %
MCH: 27.7 pg (ref 26.0–34.0)
MCHC: 33.2 g/dL (ref 30.0–36.0)
MCV: 83.4 fL (ref 78.0–100.0)
MONO ABS: 0.8 10*3/uL (ref 0.1–1.0)
Monocytes Relative: 9 %
Neutro Abs: 6.7 10*3/uL (ref 1.7–7.7)
Neutrophils Relative %: 77 %
PLATELETS: 340 10*3/uL (ref 150–400)
RBC: 4.58 MIL/uL (ref 4.22–5.81)
RDW: 14.6 % (ref 11.5–15.5)
WBC: 8.7 10*3/uL (ref 4.0–10.5)

## 2016-09-07 LAB — I-STAT TROPONIN, ED: Troponin i, poc: 0 ng/mL (ref 0.00–0.08)

## 2016-09-07 LAB — I-STAT CG4 LACTIC ACID, ED: Lactic Acid, Venous: 1.61 mmol/L (ref 0.5–1.9)

## 2016-09-07 LAB — CK: Total CK: 1187 U/L — ABNORMAL HIGH (ref 49–397)

## 2016-09-07 MED ORDER — SODIUM CHLORIDE 0.9 % IV BOLUS (SEPSIS)
1000.0000 mL | Freq: Once | INTRAVENOUS | Status: AC
  Administered 2016-09-07: 1000 mL via INTRAVENOUS

## 2016-09-07 NOTE — ED Triage Notes (Signed)
Pt was walking around at home with no clothes on and fell, pt states that that is why they called EMS

## 2016-09-07 NOTE — ED Provider Notes (Signed)
[facility] DEPT Provider Note   CSN: [id number] Arrival date & time: 09/07/16  1847     History   Chief Complaint Chief Complaint  Patient presents with  . Fall    pt fell today at home, he lives by himself EMS were called by family for a welfare check     HPI Evan Charles is a 70 y.o. male who presents with AMS and reported fall. 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 Per EMS his daughter asked EMS to do a welfare check tonight since she had not talked to him in several days and he wasn't answering the phone. When they arrived he was acutely altered and naked and had soiled himself. Patient has a hx of multiple falls. He lives alone and walks with a cane and walker at baseline per daughter. She states that this is an ongoing problem because he will be admitted to the hospital, go to a SNF, and be discharged once his mental status clears but he is still unable to care for himself at home and doesn't know how to take his medicines correctly. Patient denies pain when asked. Level 5 caveat due to acutely altered mental status.   HPI  Past Medical History:  Diagnosis Date  . Back pain   . Chronic mental illness   . Colitis   . GERD (gastroesophageal reflux disease)   . Hypertension   . Neuropathy ([place])    " MY HANDS "  . Prostate cancer [facility])     Patient Active Problem List   Diagnosis Date Noted  . UTI (lower urinary tract infection) 07/25/2016  . Falls 07/25/2016  . Mild cognitive impairment 07/25/2016  . 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 Immobility 03/20/2016  . Generalized weakness 03/20/2016  . Failure to thrive in adult 03/20/2016  . Neuropathy ([place])   . Essential hypertension   . Physical deconditioning   . 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 GERD without esophagitis 12/19/2015  . Bilateral lower extremity edema 12/19/2015   . Intractable low back pain 02/14/2015  . Difficulty walking 02/14/2015  . Prostate cancer ([place]) 02/14/2015  . H/O: upper GI bleed   . Chronic back pain   . Visual hallucinations   . Psychoses     Past Surgical History:  Procedure Laterality Date  . KNEE ARTHROSCOPY    . LAMINECTOMY    . PROSTATECTOMY         Home Medications    Prior to Admission medications   Medication Sig Start Date End Date Taking? Authorizing Provider  bisacodyl (DULCOLAX) 10 MG suppository Place 1 suppository (10 mg total) rectally daily as needed for moderate constipation. 03/22/16   Ripudeep Krystal Eaton, MD  docusate sodium (COLACE) 100 MG capsule Take 1 capsule (100 mg total) by mouth 2 (two) times daily. 03/22/16   Ripudeep Krystal Eaton, MD  hydrochlorothiazide (MICROZIDE) 12.5 MG capsule Take 1 capsule (12.5 mg total) by mouth daily. 03/22/16   Ripudeep Krystal Eaton, MD  meloxicam (MOBIC) 7.5 MG tablet Take 1 tablet (7.5 mg total) by mouth daily. 03/22/16   Ripudeep Krystal Eaton, MD  pantoprazole (PROTONIX) 40 MG tablet Take 1 tablet (40 mg total) by mouth 2 (two) times daily. 12/16/15   Leo Grosser, MD  traMADol (ULTRAM) 50 MG tablet Take 1 tablet (50 mg total) by mouth every 6 (six) hours as needed for moderate pain. 07/27/16  Mir Marry Guan, MD  traZODone (DESYREL) 50 MG tablet Take 1 tablet (50 mg total) by mouth at bedtime. 07/27/16   Mir Marry Guan, MD    Family History Family History  Problem Relation Age of Onset  . Cancer Brother   . Cancer Maternal Grandmother     Social History Social History  Substance Use Topics  . Smoking status: Former Smoker    Types: Cigarettes  . Smokeless tobacco: Never Used  . Alcohol use No     Allergies   Tomato   Review of Systems Review of Systems  Unable to perform ROS: Mental status change     Physical Exam Updated Vital Signs BP 157/84 (BP Location: Right Arm)   Pulse 97   Temp 98.2 F (36.8 C) (Oral)   Resp 23   Ht 5\' 9"  (1.753 m)   Wt 83.9  kg   SpO2 100%   BMI 27.32 kg/m   Physical Exam  Constitutional: He appears well-developed and well-nourished. He is cooperative.  Non-toxic appearance. He does not have a sickly appearance. He does not appear ill. No distress.  HENT:  Head: Normocephalic and atraumatic.  Eyes: Conjunctivae are normal. Pupils are equal, round, and reactive to light. Right eye exhibits no discharge. Left eye exhibits no discharge. No scleral icterus.  Neck: Normal range of motion. Neck supple.  Cardiovascular: Normal rate and regular rhythm.  Exam reveals no gallop and no friction rub.   No murmur heard. Pulmonary/Chest: Effort normal and breath sounds normal. No respiratory distress. He has no wheezes. He has no rales. He exhibits no tenderness.  Abdominal: Soft. Bowel sounds are normal. He exhibits no distension and no mass. There is no tenderness. There is no rebound and no guarding. No hernia.  Suprapubic tenderness  Musculoskeletal: He exhibits edema.  2+ bilateral pitting edema  Neurological: He is alert. He is disoriented. No cranial nerve deficit. GCS eye subscore is 4. GCS verbal subscore is 4. GCS motor subscore is 6.  Oriented to name. When asked where he is he says "down the road". Not oriented to year. No obvious cranial nerve deficit. No obvious unilateral weakness on exam. He is unable to lift legs off stretcher. He is able to stand with 2 person assist  Skin: Skin is warm and dry. He is not diaphoretic.  Psychiatric: He has a normal mood and affect.  Nursing note and vitals reviewed.    ED Treatments / Results  Labs (all labs ordered are listed, but only abnormal results are displayed) Labs Reviewed  CBC WITH DIFFERENTIAL/PLATELET - Abnormal; Notable for the following:       Result Value   Hemoglobin 12.7 (*)    HCT 38.2 (*)    All other components within normal limits  URINALYSIS, ROUTINE W REFLEX MICROSCOPIC (NOT AT [facility]) - Abnormal; Notable for the following:    Bilirubin Urine  SMALL (*)    Ketones, ur 40 (*)    All other components within normal limits  CK - Abnormal; Notable for the following:    Total CK 1,187 (*)    All other components within normal limits  COMPREHENSIVE METABOLIC PANEL  I-STAT CG4 LACTIC ACID, ED  I-STAT TROPOININ, ED    EKG  EKG Interpretation  Date/Time:  Friday September 07 2016 19:27:11 EDT Ventricular Rate:  101 PR Interval:    QRS Duration: 155 QT Interval:  392 QTC Calculation: 506 R Axis:   127 Text Interpretation:  Sinus tachycardia Nonspecific intraventricular conduction  delay Artifact in lead(s) I II III aVR aVL aVF V1 V2 V3 V4 Poor data quality in current ECG precludes serial comparison Confirmed by KNAPP  MD-J, JON [phone]) on 09/07/2016 7:41:34 PM       Radiology Dg Lumbar Spine Complete  Result Date: 09/07/2016 CLINICAL DATA:  Fall.  Back pain. EXAM: LUMBAR SPINE - COMPLETE 4+ VIEW COMPARISON:  Lumbar spine MRI 03/21/2016.  X-rays from 03/20/2016. FINDINGS: No evidence of fracture. No subluxation. Loss of disc height at L1-2, diffuse loss of intervertebral disc height the lumbar spine again noted. Spondylolisthesis at L4-5 is stable. SI joints are unremarkable. IMPRESSION: No evidence for lumbar spine fracture. Degenerative disc disease. Electronically Signed   By: Misty Stanley M.D.   On: 09/07/2016 20:37   Ct Head Wo Contrast  Result Date: 09/07/2016 CLINICAL DATA:  70 year old male with fall. EXAM: CT HEAD WITHOUT CONTRAST CT CERVICAL SPINE WITHOUT CONTRAST TECHNIQUE: Multidetector CT imaging of the head and cervical spine was performed following the standard protocol without intravenous contrast. Multiplanar CT image reconstructions of the cervical spine were also generated. COMPARISON:  Head CT dated 07/25/2016 FINDINGS: CT HEAD FINDINGS Brain: The ventricles and sulci are appropriate in size for patient's age. Minimal periventricular and deep white matter chronic microvascular ischemic changes noted. There is no  acute intracranial hemorrhage. No mass effect or midline shift. There is no extra-axial fluid collection. Vascular: No hyperdense vessel or unexpected calcification. Skull: Normal. Negative for fracture or focal lesion. Sinuses/Orbits: No acute finding. Other: None CT CERVICAL SPINE FINDINGS Alignment: No acute subluxation. There is straightening of the normal cervical lordosis similar prior study. Skull base and vertebrae: No acute fracture. There is incomplete bony fusion of the posterior ring of C1. Soft tissues and spinal canal: No prevertebral fluid or swelling. No visible canal hematoma. Disc levels: At C2-C3: Bilateral facet hypertrophy, right greater left with associated neural foramina narrowing on the right. At C3-C4 bilateral facet hypertrophy, left greater right there is associated narrowing of the central canal and bilateral neural foramina, left greater right. At C4-C5 there is bilateral facet hypertrophy with associated narrowing of the neural foramina. There is mild narrowing of the central canal at this level. At C5-C6 there is vertebral fusion. There is facet hypertrophy and associated neural foramina narrowing on the left. At C6-C7 bilateral facet hypertrophy, left greater right with bilateral neural foramina narrowing. There is moderate canal stenosis. At C7-T1 left facet hypertrophy. The neural foramina and central canal appear patent. Upper chest: The lung apices are clear. Other: None IMPRESSION: No acute intracranial pathology. No acute/ traumatic cervical spine pathology. Multilevel degenerative changes and disc disease with associated neural foramina and central canal narrowing as described and similar to the prior CT. Electronically Signed   By: Anner Crete M.D.   On: 09/07/2016 22:13   Ct Cervical Spine Wo Contrast  Result Date: 09/07/2016 CLINICAL DATA:  70 year old male with fall. EXAM: CT HEAD WITHOUT CONTRAST CT CERVICAL SPINE WITHOUT CONTRAST TECHNIQUE: Multidetector CT  imaging of the head and cervical spine was performed following the standard protocol without intravenous contrast. Multiplanar CT image reconstructions of the cervical spine were also generated. COMPARISON:  Head CT dated 07/25/2016 FINDINGS: CT HEAD FINDINGS Brain: The ventricles and sulci are appropriate in size for patient's age. Minimal periventricular and deep white matter chronic microvascular ischemic changes noted. There is no acute intracranial hemorrhage. No mass effect or midline shift. There is no extra-axial fluid collection. Vascular: No hyperdense vessel or unexpected calcification. Skull:  Normal. Negative for fracture or focal lesion. Sinuses/Orbits: No acute finding. Other: None CT CERVICAL SPINE FINDINGS Alignment: No acute subluxation. There is straightening of the normal cervical lordosis similar prior study. Skull base and vertebrae: No acute fracture. There is incomplete bony fusion of the posterior ring of C1. Soft tissues and spinal canal: No prevertebral fluid or swelling. No visible canal hematoma. Disc levels: At C2-C3: Bilateral facet hypertrophy, right greater left with associated neural foramina narrowing on the right. At C3-C4 bilateral facet hypertrophy, left greater right there is associated narrowing of the central canal and bilateral neural foramina, left greater right. At C4-C5 there is bilateral facet hypertrophy with associated narrowing of the neural foramina. There is mild narrowing of the central canal at this level. At C5-C6 there is vertebral fusion. There is facet hypertrophy and associated neural foramina narrowing on the left. At C6-C7 bilateral facet hypertrophy, left greater right with bilateral neural foramina narrowing. There is moderate canal stenosis. At C7-T1 left facet hypertrophy. The neural foramina and central canal appear patent. Upper chest: The lung apices are clear. Other: None IMPRESSION: No acute intracranial pathology. No acute/ traumatic cervical spine  pathology. Multilevel degenerative changes and disc disease with associated neural foramina and central canal narrowing as described and similar to the prior CT. Electronically Signed   By: Anner Crete M.D.   On: 09/07/2016 22:13   Dg Chest Port 1 View  Result Date: 09/07/2016 CLINICAL DATA:  70 year old male with altered mental status EXAM: PORTABLE CHEST 1 VIEW COMPARISON:  Chest radiograph dated 07/25/2016 FINDINGS: There is stable mild eventration of the left hemidiaphragm with associated subsegmental atelectatic changes of the left lung base. The right lung is clear. There is no pleural effusion or pneumothorax. Stable top-normal cardiac silhouette. No acute osseous pathology. IMPRESSION: No active disease. Electronically Signed   By: Anner Crete M.D.   On: 09/07/2016 23:44    Procedures Procedures (including critical care time)  Medications Ordered in ED Medications  sodium chloride 0.9 % bolus 1,000 mL (0 mLs Intravenous Stopped 09/08/16 0012)  sodium chloride 0.9 % bolus 1,000 mL (1,000 mLs Intravenous New Bag/Given 09/08/16 0011)     Initial Impression / Assessment and Plan / ED Course  I have reviewed the triage vital signs and the nursing notes.  Pertinent labs & imaging results that were available during my care of the patient were reviewed by me and considered in my medical decision making (see chart for details).  Clinical Course   70 year old male with AMS and generalized weakness. He is afebrile and mildly tachycardic. Exam is overal unrevealing however patient is profoundly disoriented and unable to walk. Discussed with daughter Leonia Corona [phone]). This seems to be a chronic issue where is unable to care for himself at home, is admitted and sent to a SNF, his mental status clears and since he has enough mental capacity to make his own decisions he is sent home again. CT head and neck are negative. CXR is negative. Lumbar spine is negative. CBC  unremarkable. CMP unremarkable. CK is 1,187 - unsure if this is chronically elevated. Lactic acid is normal. Troponin is 0. EKG is SR. IVF given.  Due to acute change in mental status and weakness will admit. Spoke with Dr. Myna Hidalgo - appreciate assistance.  Final Clinical Impressions(s) / ED Diagnoses   Final diagnoses:  Fall in home, initial encounter  Altered mental status, unspecified altered mental status type  Generalized weakness    New Prescriptions  New Prescriptions   No medications on file     Recardo Evangelist, PA-C 09/08/16 0021    Dorie Rank, MD 09/09/16 1043

## 2016-09-07 NOTE — ED Notes (Signed)
Pt noted to have audible wheezes during this time, L/S clear PA aware CXR placed pt remains back on monitor in no distress

## 2016-09-07 NOTE — ED Notes (Signed)
Attempted to ambulate pt with PA at bedside pt unable to stand at edge of bed PA aware, pt placed back in bed and turned and positioned

## 2016-09-08 ENCOUNTER — Encounter (HOSPITAL_COMMUNITY): Payer: Self-pay | Admitting: Family Medicine

## 2016-09-08 DIAGNOSIS — Z808 Family history of malignant neoplasm of other organs or systems: Secondary | ICD-10-CM | POA: Diagnosis not present

## 2016-09-08 DIAGNOSIS — K921 Melena: Secondary | ICD-10-CM | POA: Diagnosis not present

## 2016-09-08 DIAGNOSIS — R262 Difficulty in walking, not elsewhere classified: Secondary | ICD-10-CM | POA: Diagnosis not present

## 2016-09-08 DIAGNOSIS — M7989 Other specified soft tissue disorders: Secondary | ICD-10-CM | POA: Diagnosis not present

## 2016-09-08 DIAGNOSIS — F28 Other psychotic disorder not due to a substance or known physiological condition: Secondary | ICD-10-CM | POA: Diagnosis present

## 2016-09-08 DIAGNOSIS — R531 Weakness: Secondary | ICD-10-CM | POA: Diagnosis present

## 2016-09-08 DIAGNOSIS — T796XXS Traumatic ischemia of muscle, sequela: Secondary | ICD-10-CM | POA: Diagnosis not present

## 2016-09-08 DIAGNOSIS — M6282 Rhabdomyolysis: Secondary | ICD-10-CM | POA: Diagnosis present

## 2016-09-08 DIAGNOSIS — R296 Repeated falls: Secondary | ICD-10-CM | POA: Diagnosis not present

## 2016-09-08 DIAGNOSIS — R600 Localized edema: Secondary | ICD-10-CM | POA: Diagnosis not present

## 2016-09-08 DIAGNOSIS — Z8546 Personal history of malignant neoplasm of prostate: Secondary | ICD-10-CM | POA: Diagnosis not present

## 2016-09-08 DIAGNOSIS — G3184 Mild cognitive impairment, so stated: Secondary | ICD-10-CM | POA: Diagnosis not present

## 2016-09-08 DIAGNOSIS — G9340 Encephalopathy, unspecified: Secondary | ICD-10-CM | POA: Diagnosis not present

## 2016-09-08 DIAGNOSIS — C61 Malignant neoplasm of prostate: Secondary | ICD-10-CM | POA: Diagnosis not present

## 2016-09-08 DIAGNOSIS — G8929 Other chronic pain: Secondary | ICD-10-CM | POA: Diagnosis present

## 2016-09-08 DIAGNOSIS — Z23 Encounter for immunization: Secondary | ICD-10-CM | POA: Diagnosis not present

## 2016-09-08 DIAGNOSIS — R627 Adult failure to thrive: Secondary | ICD-10-CM | POA: Diagnosis present

## 2016-09-08 DIAGNOSIS — M6281 Muscle weakness (generalized): Secondary | ICD-10-CM | POA: Diagnosis not present

## 2016-09-08 DIAGNOSIS — K5900 Constipation, unspecified: Secondary | ICD-10-CM | POA: Diagnosis not present

## 2016-09-08 DIAGNOSIS — Z91018 Allergy to other foods: Secondary | ICD-10-CM | POA: Diagnosis not present

## 2016-09-08 DIAGNOSIS — L709 Acne, unspecified: Secondary | ICD-10-CM | POA: Diagnosis not present

## 2016-09-08 DIAGNOSIS — K219 Gastro-esophageal reflux disease without esophagitis: Secondary | ICD-10-CM | POA: Diagnosis not present

## 2016-09-08 DIAGNOSIS — M810 Age-related osteoporosis without current pathological fracture: Secondary | ICD-10-CM | POA: Diagnosis not present

## 2016-09-08 DIAGNOSIS — Z87891 Personal history of nicotine dependence: Secondary | ICD-10-CM | POA: Diagnosis not present

## 2016-09-08 DIAGNOSIS — G4700 Insomnia, unspecified: Secondary | ICD-10-CM | POA: Diagnosis not present

## 2016-09-08 DIAGNOSIS — D649 Anemia, unspecified: Secondary | ICD-10-CM | POA: Diagnosis present

## 2016-09-08 DIAGNOSIS — Y92099 Unspecified place in other non-institutional residence as the place of occurrence of the external cause: Secondary | ICD-10-CM

## 2016-09-08 DIAGNOSIS — G9341 Metabolic encephalopathy: Secondary | ICD-10-CM | POA: Diagnosis present

## 2016-09-08 DIAGNOSIS — D508 Other iron deficiency anemias: Secondary | ICD-10-CM | POA: Diagnosis not present

## 2016-09-08 DIAGNOSIS — W19XXXA Unspecified fall, initial encounter: Secondary | ICD-10-CM | POA: Diagnosis not present

## 2016-09-08 DIAGNOSIS — Y92009 Unspecified place in unspecified non-institutional (private) residence as the place of occurrence of the external cause: Secondary | ICD-10-CM

## 2016-09-08 DIAGNOSIS — F0390 Unspecified dementia without behavioral disturbance: Secondary | ICD-10-CM | POA: Diagnosis present

## 2016-09-08 DIAGNOSIS — Z79899 Other long term (current) drug therapy: Secondary | ICD-10-CM | POA: Diagnosis not present

## 2016-09-08 DIAGNOSIS — I10 Essential (primary) hypertension: Secondary | ICD-10-CM | POA: Diagnosis not present

## 2016-09-08 DIAGNOSIS — D509 Iron deficiency anemia, unspecified: Secondary | ICD-10-CM | POA: Diagnosis present

## 2016-09-08 DIAGNOSIS — K922 Gastrointestinal hemorrhage, unspecified: Secondary | ICD-10-CM | POA: Diagnosis not present

## 2016-09-08 DIAGNOSIS — R410 Disorientation, unspecified: Secondary | ICD-10-CM | POA: Diagnosis not present

## 2016-09-08 DIAGNOSIS — D500 Iron deficiency anemia secondary to blood loss (chronic): Secondary | ICD-10-CM | POA: Diagnosis not present

## 2016-09-08 DIAGNOSIS — R488 Other symbolic dysfunctions: Secondary | ICD-10-CM | POA: Diagnosis not present

## 2016-09-08 DIAGNOSIS — M545 Low back pain: Secondary | ICD-10-CM | POA: Diagnosis not present

## 2016-09-08 DIAGNOSIS — F0391 Unspecified dementia with behavioral disturbance: Secondary | ICD-10-CM | POA: Diagnosis not present

## 2016-09-08 HISTORY — DX: Rhabdomyolysis: M62.82

## 2016-09-08 LAB — RAPID URINE DRUG SCREEN, HOSP PERFORMED
AMPHETAMINES: 0 — AB
BENZODIAZEPINES: 0 — AB
Barbiturates: 0 — AB
Cocaine: 0 — AB
OPIATES: 0 — AB
Tetrahydrocannabinol: 0 — AB

## 2016-09-08 LAB — IRON AND TIBC
Iron: 62 ug/dL (ref 45–182)
SATURATION RATIOS: 18 % (ref 17.9–39.5)
TIBC: 342 ug/dL (ref 250–450)
UIBC: 280 ug/dL

## 2016-09-08 LAB — BRAIN NATRIURETIC PEPTIDE: B Natriuretic Peptide: 18.6 pg/mL (ref 0.0–100.0)

## 2016-09-08 LAB — HIV ANTIBODY (ROUTINE TESTING W REFLEX): HIV SCREEN 4TH GENERATION: NONREACTIVE

## 2016-09-08 LAB — CK: CK TOTAL: 2669 U/L — AB (ref 49–397)

## 2016-09-08 LAB — T4, FREE: FREE T4: 1.29 ng/dL — AB (ref 0.61–1.12)

## 2016-09-08 LAB — RPR: RPR Ser Ql: NONREACTIVE

## 2016-09-08 LAB — VITAMIN B12: VITAMIN B 12: 274 pg/mL (ref 180–914)

## 2016-09-08 LAB — FERRITIN: Ferritin: 11 ng/mL — ABNORMAL LOW (ref 24–336)

## 2016-09-08 LAB — AMMONIA: AMMONIA: 28 umol/L (ref 9–35)

## 2016-09-08 LAB — ETHANOL

## 2016-09-08 LAB — SEDIMENTATION RATE: SED RATE: 19 mm/h — AB (ref 0–16)

## 2016-09-08 LAB — TSH: TSH: 3.056 u[IU]/mL (ref 0.350–4.500)

## 2016-09-08 MED ORDER — TRAMADOL HCL 50 MG PO TABS
100.0000 mg | ORAL_TABLET | Freq: Four times a day (QID) | ORAL | Status: DC | PRN
  Administered 2016-09-08 (×2): 100 mg via ORAL
  Filled 2016-09-08 (×2): qty 2

## 2016-09-08 MED ORDER — ACETAMINOPHEN 650 MG RE SUPP: 650.0000 mg | Freq: Four times a day (QID) | RECTAL | Status: DC | PRN

## 2016-09-08 MED ORDER — SODIUM CHLORIDE 0.9 % IV BOLUS (SEPSIS)
1000.0000 mL | Freq: Once | INTRAVENOUS | Status: AC
  Administered 2016-09-08: 1000 mL via INTRAVENOUS

## 2016-09-08 MED ORDER — MELOXICAM 7.5 MG PO TABS
7.5000 mg | ORAL_TABLET | Freq: Every day | ORAL | Status: DC
  Administered 2016-09-08 – 2016-09-13 (×6): 7.5 mg via ORAL
  Filled 2016-09-08 (×6): qty 1

## 2016-09-08 MED ORDER — BISACODYL 10 MG RE SUPP: 10.0000 mg | Freq: Every day | RECTAL | Status: DC | PRN

## 2016-09-08 MED ORDER — ENOXAPARIN SODIUM 40 MG/0.4ML ~~LOC~~ SOLN
40.0000 mg | SUBCUTANEOUS | Status: DC
  Administered 2016-09-08 – 2016-09-13 (×6): 40 mg via SUBCUTANEOUS
  Filled 2016-09-08 (×6): qty 0.4

## 2016-09-08 MED ORDER — ONDANSETRON HCL 4 MG/2ML IJ SOLN: 4.0000 mg | Freq: Four times a day (QID) | INTRAMUSCULAR | Status: DC | PRN

## 2016-09-08 MED ORDER — SODIUM CHLORIDE 0.9 % IV SOLN
INTRAVENOUS | Status: DC
  Administered 2016-09-08 – 2016-09-09 (×3): via INTRAVENOUS

## 2016-09-08 MED ORDER — DOCUSATE SODIUM 100 MG PO CAPS: 100.0000 mg | ORAL_CAPSULE | Freq: Two times a day (BID) | ORAL | Status: DC | PRN

## 2016-09-08 MED ORDER — PANTOPRAZOLE SODIUM 40 MG PO TBEC
40.0000 mg | DELAYED_RELEASE_TABLET | Freq: Two times a day (BID) | ORAL | Status: DC
  Administered 2016-09-08 – 2016-09-13 (×11): 40 mg via ORAL
  Filled 2016-09-08 (×11): qty 1

## 2016-09-08 MED ORDER — ACETAMINOPHEN 325 MG PO TABS
650.0000 mg | ORAL_TABLET | Freq: Four times a day (QID) | ORAL | Status: DC | PRN
  Administered 2016-09-09 – 2016-09-10 (×2): 650 mg via ORAL
  Filled 2016-09-08 (×2): qty 2

## 2016-09-08 MED ORDER — SODIUM CHLORIDE 0.9 % IV SOLN: INTRAVENOUS | Status: AC

## 2016-09-08 MED ORDER — HYDROCHLOROTHIAZIDE 12.5 MG PO CAPS: 12.5000 mg | ORAL_CAPSULE | Freq: Every day | ORAL | Status: DC

## 2016-09-08 MED ORDER — TRAZODONE HCL 50 MG PO TABS: 50.0000 mg | ORAL_TABLET | Freq: Every day | ORAL | Status: DC

## 2016-09-08 MED ORDER — HYDRALAZINE HCL 20 MG/ML IJ SOLN: 10.0000 mg | INTRAMUSCULAR | Status: DC | PRN

## 2016-09-08 MED ORDER — IPRATROPIUM-ALBUTEROL 0.5-2.5 (3) MG/3ML IN SOLN
3.0000 mL | RESPIRATORY_TRACT | Status: DC | PRN
  Administered 2016-09-09 – 2016-09-10 (×2): 3 mL via RESPIRATORY_TRACT
  Filled 2016-09-08 (×2): qty 3

## 2016-09-08 MED ORDER — ONDANSETRON HCL 4 MG PO TABS: 4.0000 mg | ORAL_TABLET | Freq: Four times a day (QID) | ORAL | Status: DC | PRN

## 2016-09-08 NOTE — Evaluation (Signed)
Physical Therapy Evaluation Patient Details Name: Evan Charles MRN: [id number] DOB: 1946/09/20 Today's Date: 09/08/2016   History of Present Illness  Pt is a 70 y/o male admitted after falling at home. PMH including but not limited to chronic mental illness (unspecified), chornic back pain and HTN.  Clinical Impression  Pt presented supine in bed with HOB elevated, awake and willing to participate in therapy session. There were no family members or caregivers present and pt's historian abilities were questionable and unreliable. Pt required mod A x2 to achieve sitting EOB from supine, total A x2 to return to supine from sitting EOB and max A x2 for STS. At this time PT recommending pt d/c to SNF for further therapy services prior to returning home. Pt would continue to benefit from skilled physical therapy services at this time while admitted and after d/c to address his below listed limitations in order to improve his overall safety and independence with functional mobility.     Follow Up Recommendations SNF;Supervision/Assistance - 24 hour    Equipment Recommendations  None recommended by PT    Recommendations for Other Services       Precautions / Restrictions Precautions Precautions: Fall Restrictions Weight Bearing Restrictions: No      Mobility  Bed Mobility Overal bed mobility: Needs Assistance;+2 for physical assistance Bed Mobility: Supine to Sit;Sit to Supine     Supine to sit: Mod assist;+2 for physical assistance;HOB elevated Sit to supine: Total assist;+2 for physical assistance   General bed mobility comments: pt required mod A x2 for bilateral LEs and upper body as well as use of bed pad to achieve sitting EOB. pt then required total A x2 to return to supine.  Transfers Overall transfer level: Needs assistance Equipment used: 2 person hand held assist Transfers: Sit to/from Stand Sit to Stand: Max assist;+2 physical assistance         General  transfer comment: Pt required max A x2 with use of bed pad to achieve full standing on second attempt. pt unable to utilize bilateral UEs to assist or follow VC's for bilateral UE positioning  Ambulation/Gait                Stairs            Wheelchair Mobility    Modified Rankin (Stroke Patients Only)       Balance Overall balance assessment: Needs assistance Sitting-balance support: Feet supported;Bilateral upper extremity supported Sitting balance-Leahy Scale: Poor   Postural control: Posterior lean Standing balance support: During functional activity Standing balance-Leahy Scale: Poor Standing balance comment: in standing with max A x2, pt demonstrated a posterior lean that he was able to slightly correct with VC'ing.                             Pertinent Vitals/Pain Pain Assessment: No/denies pain    Home Living Family/patient expects to be discharged to:: Unsure Living Arrangements: Alone               Additional Comments: Pt is a poor historian, unable to determine reliability of information provided.    Prior Function Level of Independence: Needs assistance   Gait / Transfers Assistance Needed: pt reported that he uses a RW to ambulate and has his nephew that assists him with walking as well  ADL's / Homemaking Assistance Needed: pt stated that his nephew assists him with dressing and bathing  Comments: No family or caregivers  present and questionable reliability of pt information.     Hand Dominance        Extremity/Trunk Assessment   Upper Extremity Assessment: RUE deficits/detail;LUE deficits/detail RUE Deficits / Details: Pt with difficulty opening/closing hands actively secondary to muscle spasms     LUE Deficits / Details: Pt with difficulty opening/closing hands secondary to muscle spasms   Lower Extremity Assessment: RLE deficits/detail;LLE deficits/detail RLE Deficits / Details: bilateral pitting edema noted. pt  with intermittent increased muscle tone throughout LE and difficulty actively moving LE with verbal and tactile cueing.       Communication   Communication: Expressive difficulties  Cognition Arousal/Alertness: Awake/alert Behavior During Therapy: WFL for tasks assessed/performed Overall Cognitive Status: History of cognitive impairments - at baseline       Memory: Decreased short-term memory              General Comments      Exercises General Exercises - Lower Extremity Heel Slides: AROM;AAROM;Both;5 reps;Supine   Assessment/Plan    PT Assessment Patient needs continued PT services  PT Problem List Decreased strength;Decreased range of motion;Decreased activity tolerance;Decreased balance;Decreased mobility;Decreased coordination;Decreased cognition;Decreased knowledge of use of DME;Decreased safety awareness          PT Treatment Interventions DME instruction;Gait training;Stair training;Functional mobility training;Therapeutic activities;Therapeutic exercise;Balance training;Neuromuscular re-education;Patient/family education    PT Goals (Current goals can be found in the Care Plan section)  Acute Rehab PT Goals Patient Stated Goal: none stated PT Goal Formulation: With patient Time For Goal Achievement: 09/22/16 Potential to Achieve Goals: Fair    Frequency Min 3X/week   Barriers to discharge        Co-evaluation               End of Session Equipment Utilized During Treatment: Gait belt Activity Tolerance: Patient limited by fatigue Patient left: in bed;with call bell/phone within reach Nurse Communication: Mobility status    Functional Assessment Tool Used: clinical judgement Functional Limitation: Mobility: Walking and moving around;Changing and maintaining body position Mobility: Walking and Moving Around Current Status [phone]): 100 percent impaired, limited or restricted Mobility: Walking and Moving Around Goal Status [phone]): At least 40  percent but less than 60 percent impaired, limited or restricted Changing and Maintaining Body Position Current Status [id number]): 100 percent impaired, limited or restricted Changing and Maintaining Body Position Goal Status [id number]): At least 40 percent but less than 60 percent impaired, limited or restricted    Time: 1135-1207 PT Time Calculation (min) (ACUTE ONLY): 32 min   Charges:   PT Evaluation $PT Eval Moderate Complexity: 1 Procedure PT Treatments $Therapeutic Activity: 8-22 mins   PT G Codes:   PT G-Codes **NOT FOR INPATIENT CLASS** Functional Assessment Tool Used: clinical judgement Functional Limitation: Mobility: Walking and moving around;Changing and maintaining body position Mobility: Walking and Moving Around Current Status [phone]): 100 percent impaired, limited or restricted Mobility: Walking and Moving Around Goal Status [phone]): At least 40 percent but less than 60 percent impaired, limited or restricted Changing and Maintaining Body Position Current Status [id number]): 100 percent impaired, limited or restricted Changing and Maintaining Body Position Goal Status [id number]): At least 40 percent but less than 60 percent impaired, limited or restricted    [facility] 09/08/2016, 1:25 PM Sherie Don, [place], DPT [phone]

## 2016-09-08 NOTE — H&P (Signed)
History and Physical    Evan Charles [id number] DOB: May 28, 1946 DOA: 09/07/2016  PCP: PROVIDER NOT IN SYSTEM   Patient coming from: Home  Chief Complaint: Found down, confused   HPI: Evan Charles is a 70 y.o. male with medical history significant for GERD, hypertension, unspecified mental illness, and chronic back pain who presents to the emergency department with acute encephalopathy after being found down at home. Patient reportedly lives alone and his daughter had been unable to reach him by phone for several days and requested that EMS perform a welfare check. Patient was found on the floor of his home, naked, and incontinent. He was alert, but not oriented. Patient is unable to contribute much at all to the history due to his acute encephalopathy. History is therefore obtained through discussion with the ED personnel, the patient's daughter, Lelon Frohlich review of the EMR. Patient has been admitted with acute encephalopathy in the past, most recently in September this year, when he had a UTI as well. He was discharged to a skilled nursing facility at that time but has since returned home where he lives alone. His daughter reports that he is unable to manage his medications on his own and is unable to care for himself even when he is in his best state. Patient was able to bear weight and ambulate short distances with intensive assistance, but is too weak to stand on his own for more than several seconds. Patient does not voice any specific complaints and reports feeling well.   ED Course: Upon arrival to the ED, patient is found to be afebrile, saturating well on room air, mildly tachycardic in the low 100s, and with vitals otherwise stable. EKG demonstrates sinus tachycardia with rate 101 and nonspecific intraventricular conduction delay. Chest x-ray is negative for acute cardiopulmonary disease, chemistry panel was unremarkable, and CBC is notable only for a stable normocytic anemia with  hemoglobin of 12.7. Lactic acid is reassuring at 1.61, troponin is undetectable, urinalysis is unremarkable, and CK is elevated to a value of 1187. Radiographs of the lumbar spine are negative for acute injury and CT of the head and cervical spine are also negative for acute pathology. Patient was given 2 L of normal saline in the ED with resolution of his tachycardia. He remained hemodynamically stable and in no apparent respiratory distress, but is too weak to ambulate on his own and is oriented to self only. He will be observed on the medical-surgical unit for ongoing evaluation and management of acute encephalopathy with mild rhabdomyolysis.  Review of Systems:  All other systems reviewed and apart from HPI, are negative.  Past Medical History:  Diagnosis Date  . Back pain   . Chronic mental illness   . Colitis   . GERD (gastroesophageal reflux disease)   . Hypertension   . Neuropathy ([place])    " MY HANDS "  . Prostate cancer [facility])     Past Surgical History:  Procedure Laterality Date  . KNEE ARTHROSCOPY    . LAMINECTOMY    . PROSTATECTOMY       reports that he has quit smoking. His smoking use included Cigarettes. He has never used smokeless tobacco. He reports that he does not drink alcohol or use drugs.  Allergies  Allergen Reactions  . Tomato Itching and Rash    Family History  Problem Relation Age of Onset  . Cancer Brother   . Cancer Maternal Grandmother      Prior to Admission medications  Medication Sig Start Date End Date Taking? Authorizing Provider  docusate sodium (COLACE) 100 MG capsule Take 1 capsule (100 mg total) by mouth 2 (two) times daily. Patient taking differently: Take 100 mg by mouth 2 (two) times daily as needed (constipation).  03/22/16  Yes Ripudeep Krystal Eaton, MD  pantoprazole (PROTONIX) 40 MG tablet Take 1 tablet (40 mg total) by mouth 2 (two) times daily. 12/16/15  Yes Leo Grosser, MD  traMADol (ULTRAM) 50 MG tablet Take 1 tablet (50 mg total) by  mouth every 6 (six) hours as needed for moderate pain. Patient taking differently: Take 100 mg by mouth 3 (three) times daily.  07/27/16  Yes Mir Marry Guan, MD  traZODone (DESYREL) 50 MG tablet Take 1 tablet (50 mg total) by mouth at bedtime. 07/27/16  Yes Mir Marry Guan, MD  bisacodyl (DULCOLAX) 10 MG suppository Place 1 suppository (10 mg total) rectally daily as needed for moderate constipation. 03/22/16   Ripudeep Krystal Eaton, MD  hydrochlorothiazide (MICROZIDE) 12.5 MG capsule Take 1 capsule (12.5 mg total) by mouth daily. 03/22/16   Ripudeep Krystal Eaton, MD  meloxicam (MOBIC) 7.5 MG tablet Take 1 tablet (7.5 mg total) by mouth daily. 03/22/16   Ripudeep Krystal Eaton, MD    Physical Exam: Vitals:   09/07/16 2257 09/07/16 2301 09/07/16 2315 09/07/16 2330  BP: 140/80  143/63 142/93  Pulse: 100  92 95  Resp: 22  18 19   Temp:  98.3 F (36.8 C)    TempSrc:  Oral    SpO2: 98%  97% 96%  Weight:      Height:          Constitutional: NAD, calm, comfortable Eyes: PERTLA, lids and conjunctivae normal ENMT: Mucous membranes are dry. Posterior pharynx clear of any exudate or lesions.   Neck: normal, supple, no masses, no thyromegaly Respiratory: clear to auscultation bilaterally, no wheezing, no crackles. Normal respiratory effort.    Cardiovascular: S1 & S2 heard, regular rate and rhythm. Bilateral LE edema to knees. No significant JVD. Abdomen: No distension, no tenderness, no masses palpated. Bowel sounds normal.  Musculoskeletal: no clubbing / cyanosis. No joint deformity upper and lower extremities. Normal muscle tone.  Skin: no significant rashes, lesions, ulcers. Warm, dry, well-perfused. Neurologic: CN 2-12 grossly intact. Sensation intact, DTR normal. Strength diminished globally; no focal deficits identified.  Psychiatric: Pt acute confused, giving inappropriate answers to simple questioning. Oriented to self only.     Labs on Admission: I have personally reviewed following labs  and imaging studies  CBC:  Recent Labs Lab 09/07/16 1920  WBC 8.7  NEUTROABS 6.7  HGB 12.7*  HCT 38.2*  MCV 83.4  PLT 123XX123   Basic Metabolic Panel:  Recent Labs Lab 09/07/16 1920  NA 141  K 4.0  CL 105  CO2 26  GLUCOSE 90  BUN 13  CREATININE 0.76  CALCIUM 9.2   GFR: Estimated Creatinine Clearance: 85.9 mL/min (by C-G formula based on SCr of 0.76 mg/dL). Liver Function Tests:  Recent Labs Lab 09/07/16 1920  AST 35  ALT 18  ALKPHOS 45  BILITOT 0.9  PROT 7.0  ALBUMIN 4.0   No results for input(s): LIPASE, AMYLASE in the last 168 hours. No results for input(s): AMMONIA in the last 168 hours. Coagulation Profile: No results for input(s): INR, PROTIME in the last 168 hours. Cardiac Enzymes:  Recent Labs Lab 09/07/16 1920  CKTOTAL 1,187*   BNP (last 3 results) No results for input(s): PROBNP in the last 8760  hours. HbA1C: No results for input(s): HGBA1C in the last 72 hours. CBG: No results for input(s): GLUCAP in the last 168 hours. Lipid Profile: No results for input(s): CHOL, HDL, LDLCALC, TRIG, CHOLHDL, LDLDIRECT in the last 72 hours. Thyroid Function Tests: No results for input(s): TSH, T4TOTAL, FREET4, T3FREE, THYROIDAB in the last 72 hours. Anemia Panel: No results for input(s): VITAMINB12, FOLATE, FERRITIN, TIBC, IRON, RETICCTPCT in the last 72 hours. Urine analysis:    Component Value Date/Time   COLORURINE YELLOW 09/07/2016 1938   APPEARANCEUR CLEAR 09/07/2016 1938   LABSPEC 1.023 09/07/2016 1938   PHURINE 6.0 09/07/2016 1938   GLUCOSEU NEGATIVE 09/07/2016 1938   HGBUR NEGATIVE 09/07/2016 1938   BILIRUBINUR SMALL (A) 09/07/2016 1938   KETONESUR 40 (A) 09/07/2016 1938   PROTEINUR NEGATIVE 09/07/2016 1938   UROBILINOGEN 0.2 02/13/2015 1957   NITRITE NEGATIVE 09/07/2016 1938   LEUKOCYTESUR NEGATIVE 09/07/2016 1938   Sepsis Labs: @LABRCNTIP (procalcitonin:4,lacticidven:4) )No results found for this or any previous visit (from the past  240 hour(s)).   Radiological Exams on Admission: Dg Lumbar Spine Complete  Result Date: 09/07/2016 CLINICAL DATA:  Fall.  Back pain. EXAM: LUMBAR SPINE - COMPLETE 4+ VIEW COMPARISON:  Lumbar spine MRI 03/21/2016.  X-rays from 03/20/2016. FINDINGS: No evidence of fracture. No subluxation. Loss of disc height at L1-2, diffuse loss of intervertebral disc height the lumbar spine again noted. Spondylolisthesis at L4-5 is stable. SI joints are unremarkable. IMPRESSION: No evidence for lumbar spine fracture. Degenerative disc disease. Electronically Signed   By: Misty Stanley M.D.   On: 09/07/2016 20:37   Ct Head Wo Contrast  Result Date: 09/07/2016 CLINICAL DATA:  70 year old male with fall. EXAM: CT HEAD WITHOUT CONTRAST CT CERVICAL SPINE WITHOUT CONTRAST TECHNIQUE: Multidetector CT imaging of the head and cervical spine was performed following the standard protocol without intravenous contrast. Multiplanar CT image reconstructions of the cervical spine were also generated. COMPARISON:  Head CT dated 07/25/2016 FINDINGS: CT HEAD FINDINGS Brain: The ventricles and sulci are appropriate in size for patient's age. Minimal periventricular and deep white matter chronic microvascular ischemic changes noted. There is no acute intracranial hemorrhage. No mass effect or midline shift. There is no extra-axial fluid collection. Vascular: No hyperdense vessel or unexpected calcification. Skull: Normal. Negative for fracture or focal lesion. Sinuses/Orbits: No acute finding. Other: None CT CERVICAL SPINE FINDINGS Alignment: No acute subluxation. There is straightening of the normal cervical lordosis similar prior study. Skull base and vertebrae: No acute fracture. There is incomplete bony fusion of the posterior ring of C1. Soft tissues and spinal canal: No prevertebral fluid or swelling. No visible canal hematoma. Disc levels: At C2-C3: Bilateral facet hypertrophy, right greater left with associated neural foramina  narrowing on the right. At C3-C4 bilateral facet hypertrophy, left greater right there is associated narrowing of the central canal and bilateral neural foramina, left greater right. At C4-C5 there is bilateral facet hypertrophy with associated narrowing of the neural foramina. There is mild narrowing of the central canal at this level. At C5-C6 there is vertebral fusion. There is facet hypertrophy and associated neural foramina narrowing on the left. At C6-C7 bilateral facet hypertrophy, left greater right with bilateral neural foramina narrowing. There is moderate canal stenosis. At C7-T1 left facet hypertrophy. The neural foramina and central canal appear patent. Upper chest: The lung apices are clear. Other: None IMPRESSION: No acute intracranial pathology. No acute/ traumatic cervical spine pathology. Multilevel degenerative changes and disc disease with associated neural foramina and central canal  narrowing as described and similar to the prior CT. Electronically Signed   By: Anner Crete M.D.   On: 09/07/2016 22:13   Ct Cervical Spine Wo Contrast  Result Date: 09/07/2016 CLINICAL DATA:  70 year old male with fall. EXAM: CT HEAD WITHOUT CONTRAST CT CERVICAL SPINE WITHOUT CONTRAST TECHNIQUE: Multidetector CT imaging of the head and cervical spine was performed following the standard protocol without intravenous contrast. Multiplanar CT image reconstructions of the cervical spine were also generated. COMPARISON:  Head CT dated 07/25/2016 FINDINGS: CT HEAD FINDINGS Brain: The ventricles and sulci are appropriate in size for patient's age. Minimal periventricular and deep white matter chronic microvascular ischemic changes noted. There is no acute intracranial hemorrhage. No mass effect or midline shift. There is no extra-axial fluid collection. Vascular: No hyperdense vessel or unexpected calcification. Skull: Normal. Negative for fracture or focal lesion. Sinuses/Orbits: No acute finding. Other: None CT  CERVICAL SPINE FINDINGS Alignment: No acute subluxation. There is straightening of the normal cervical lordosis similar prior study. Skull base and vertebrae: No acute fracture. There is incomplete bony fusion of the posterior ring of C1. Soft tissues and spinal canal: No prevertebral fluid or swelling. No visible canal hematoma. Disc levels: At C2-C3: Bilateral facet hypertrophy, right greater left with associated neural foramina narrowing on the right. At C3-C4 bilateral facet hypertrophy, left greater right there is associated narrowing of the central canal and bilateral neural foramina, left greater right. At C4-C5 there is bilateral facet hypertrophy with associated narrowing of the neural foramina. There is mild narrowing of the central canal at this level. At C5-C6 there is vertebral fusion. There is facet hypertrophy and associated neural foramina narrowing on the left. At C6-C7 bilateral facet hypertrophy, left greater right with bilateral neural foramina narrowing. There is moderate canal stenosis. At C7-T1 left facet hypertrophy. The neural foramina and central canal appear patent. Upper chest: The lung apices are clear. Other: None IMPRESSION: No acute intracranial pathology. No acute/ traumatic cervical spine pathology. Multilevel degenerative changes and disc disease with associated neural foramina and central canal narrowing as described and similar to the prior CT. Electronically Signed   By: Anner Crete M.D.   On: 09/07/2016 22:13   Dg Chest Port 1 View  Result Date: 09/07/2016 CLINICAL DATA:  70 year old male with altered mental status EXAM: PORTABLE CHEST 1 VIEW COMPARISON:  Chest radiograph dated 07/25/2016 FINDINGS: There is stable mild eventration of the left hemidiaphragm with associated subsegmental atelectatic changes of the left lung base. The right lung is clear. There is no pleural effusion or pneumothorax. Stable top-normal cardiac silhouette. No acute osseous pathology.  IMPRESSION: No active disease. Electronically Signed   By: Anner Crete M.D.   On: 09/07/2016 23:44    EKG: Independently reviewed. Sinus tachycardia (rate 101), non-specific IVCD   Assessment/Plan  1. Acute encephalopathy  - Pt is reportedly confused and unable to manage his medications at baseline, but lives alone  - He was found down in his home in a puddle of urine, naked and disoriented  - There are no focal neurologic deficits identified and head CT is negative for acute pathology  - UA not suggestive on infection  - Will check ammonia, thyroid studies, RPR, B12, folate, EtOH, UDS - Supportive care    2. Rhabdomyolysis  - CK elevated to 1,187 with preserved renal function  - Likely secondary to prolonged time on floor PTA  - Imaging of chest, L-spine, C-spine, and head negative for acute injury  - Continue IV  hydration; repeat CK level in am    3. Generalized weakness, falls at home  - This seems to be a chronic problem per review of EMR  - There is no acute injury noted on imaging in ED  - PT eval requested; anticipate pt will need SNF again    4. Hypertension  - Mildly elevated in ED  - Managed with HCTZ at home, currently held while hydrating  - Monitor and treat with hydralazine IVP's prn   5. GERD  - No EGD report on file  - Managed with BID Protonix at home, will continue    6. Normocytic anemia  - Hgb 12.7 on admission, stable relative to priors in 11-13 range  - No s/s of bleeding  - Check iron studies, B12, folate; supplement as indicated   7. Bilateral LE edema  - No TTE on file; does not appear to be in CHF, and actually appears to be significantly dry intravascularly - Check venous US     DVT prophylaxis: sq Lovenox  Code Status: Full  Family Communication: Daughter updated by phone Disposition Plan: Observe on med-surg Consults called: None Admission status: Observation    Vianne Bulls, MD [facility] Pager [phone]  If  7PM-7AM, please contact night-coverage [email or web address] Password [email or web address]  09/08/2016, 12:29 AM

## 2016-09-08 NOTE — Progress Notes (Signed)
PROGRESS NOTE  Evan Charles  [id number] DOB: 24-Feb-1946  DOA: 09/07/2016 PCP: PROVIDER NOT IN SYSTEM   Brief Narrative:  70 year old male with a PMH of chronic unspecified mental illness, GERD, HTN, chronic back pain presented to [facility] ED on 09/07/16 with a clinical picture of acute encephalopathy after being found down at home. Patient reportedly lives alone and his daughter was unable to reach him by phone for several days and requested EMS performed a welfare check. He was found on the floor of his home, naked and incontinent, alert but not oriented. History unobtainable from patient. He has been admitted with acute encephalopathy in the past, most recently in September 2017 when he had a UTI. He was discharged to SNF and then home. As per family, he is unable to manage his medications or care for himself even at his baseline best. Significant workup in ED showed CK 1187, CT head and C-spine without acute pathology. He was admitted for evaluation and management of acute encephalopathy and mild rhabdomyolysis.   Assessment & Plan:   Principal Problem:   Acute encephalopathy Active Problems:   GERD without esophagitis   Bilateral lower extremity edema   Generalized weakness   Essential hypertension   Rhabdomyolysis   Normocytic anemia   Altered mental status   1. Acute encephalopathy: Patient unable to provide any history since arrival to the hospital. As per family, patient is unable to take care of himself at home. Etiology unclear. CT head and C-spine without acute findings. No focal deficits on exam. Urine microscopy not suggestive of UTI. CMP normal. Ammonia normal. TSH normal. B12: 274. RPR was negative in January 2017. Blood alcohol level and UDS negative.? Related to medications he might have taken at home. Although low index of suspicion, we will obtain EEG to look for seizure disorder. Monitor clinically. Minimize/Avoid sedative medications. 2. Rhabdomyolysis: Likely related  to being down on the floor. CK has increased from 1187 > 2669. Aggressive IV fluids and follow CK. Imaging results as below do not show any acute injuries. 3. Essential hypertension: Mildly uncontrolled. HCTZ held due to rhabdomyolysis. When necessary IV hydralazine. DC HCTZ indefinitely. 4. GERD: PPI. 5. Bilateral lower extremity edema: Likely nutritional. Checking lower extremity venous ultrasound due to asymmetry i.e. left greater than right 6. Adult failure to thrive/falls at home: No clear history surrounding the recent event. PT recommends SNF and may need long-term placement. 7. Iron deficiency anemia: Ferritin: 11, iron 62. Consider outpatient evaluation for etiology. May need to undergo GI workup if not done. Follow CBCs periodically.   DVT prophylaxis: Lovenox Code Status: Full Family Communication: None at bedside Disposition Plan: DC to SNF when medically stable   Consultants:   None  Procedures:   None  Antimicrobials:   None    Subjective: Drowsy but arousable. Oriented to self only. Follows some simple instructions. Not oriented otherwise. As per RN, no acute issues reported.  Objective:  Vitals:   09/08/16 0030 09/08/16 0045 09/08/16 0145 09/08/16 0614  BP: 145/89 157/88 (!) 162/79 (!) 159/71  Pulse: 94 100 100 (!) 103  Resp: 18 19 18 19   Temp:   98.4 F (36.9 C) 98.8 F (37.1 C)  TempSrc:   Oral Oral  SpO2: 94% 99% 100% 99%  Weight:      Height:        Intake/Output Summary (Last 24 hours) at 09/08/16 1341 Last data filed at 09/08/16 0930  Gross per 24 hour  Intake  1580 ml  Output                0 ml  Net             1580 ml   Filed Weights   09/07/16 1852  Weight: 83.9 kg (185 lb)    Examination:  General exam: Pleasant elderly male lying comfortably propped up in bed. Respiratory system: Clear to auscultation. Respiratory effort normal. Cardiovascular system: S1 & S2 heard, RRR. No JVD, murmurs, rubs, gallops or clicks.    Gastrointestinal system: Abdomen is nondistended, soft and nontender. No organomegaly or masses felt. Normal bowel sounds heard. Central nervous system: Somnolent but easily arousable to call. Briefly opens eyes. Mumbles his name and goes back to sleep. Follows some simple instructions i.e. open mouth, stick out tongue, raise hands. No focal neurological deficits. Extremities: moving all extremities symmetrically. 1+ pitting bilateral leg edema, left >right. No acute findings.  Skin: No rashes, lesions or ulcers Psychiatry: Judgement and insight cannot be assessed due to altered mental status.     Data Reviewed: I have personally reviewed following labs and imaging studies  CBC:  Recent Labs Lab 09/07/16 1920  WBC 8.7  NEUTROABS 6.7  HGB 12.7*  HCT 38.2*  MCV 83.4  PLT 123XX123   Basic Metabolic Panel:  Recent Labs Lab 09/07/16 1920  NA 141  K 4.0  CL 105  CO2 26  GLUCOSE 90  BUN 13  CREATININE 0.76  CALCIUM 9.2   GFR: Estimated Creatinine Clearance: 85.9 mL/min (by C-G formula based on SCr of 0.76 mg/dL). Liver Function Tests:  Recent Labs Lab 09/07/16 1920  AST 35  ALT 18  ALKPHOS 45  BILITOT 0.9  PROT 7.0  ALBUMIN 4.0   No results for input(s): LIPASE, AMYLASE in the last 168 hours.  Recent Labs Lab 09/08/16 0216  AMMONIA 28   Coagulation Profile: No results for input(s): INR, PROTIME in the last 168 hours. Cardiac Enzymes:  Recent Labs Lab 09/07/16 1920 09/08/16 0216  CKTOTAL 1,187* 2,669*   BNP (last 3 results) No results for input(s): PROBNP in the last 8760 hours. HbA1C: No results for input(s): HGBA1C in the last 72 hours. CBG: No results for input(s): GLUCAP in the last 168 hours. Lipid Profile: No results for input(s): CHOL, HDL, LDLCALC, TRIG, CHOLHDL, LDLDIRECT in the last 72 hours. Thyroid Function Tests:  Recent Labs  09/08/16 0216  TSH 3.056  FREET4 1.29*   Anemia Panel:  Recent Labs  09/08/16 0216  VITAMINB12 274   FERRITIN 11*  TIBC 342  IRON 62    Sepsis Labs:  Recent Labs Lab 09/07/16 1949  LATICACIDVEN 1.61    No results found for this or any previous visit (from the past 240 hour(s)).       Radiology Studies: Dg Lumbar Spine Complete  Result Date: 09/07/2016 CLINICAL DATA:  Fall.  Back pain. EXAM: LUMBAR SPINE - COMPLETE 4+ VIEW COMPARISON:  Lumbar spine MRI 03/21/2016.  X-rays from 03/20/2016. FINDINGS: No evidence of fracture. No subluxation. Loss of disc height at L1-2, diffuse loss of intervertebral disc height the lumbar spine again noted. Spondylolisthesis at L4-5 is stable. SI joints are unremarkable. IMPRESSION: No evidence for lumbar spine fracture. Degenerative disc disease. Electronically Signed   By: Misty Stanley M.D.   On: 09/07/2016 20:37   Ct Head Wo Contrast  Result Date: 09/07/2016 CLINICAL DATA:  70 year old male with fall. EXAM: CT HEAD WITHOUT CONTRAST CT CERVICAL SPINE WITHOUT CONTRAST TECHNIQUE: Multidetector  CT imaging of the head and cervical spine was performed following the standard protocol without intravenous contrast. Multiplanar CT image reconstructions of the cervical spine were also generated. COMPARISON:  Head CT dated 07/25/2016 FINDINGS: CT HEAD FINDINGS Brain: The ventricles and sulci are appropriate in size for patient's age. Minimal periventricular and deep white matter chronic microvascular ischemic changes noted. There is no acute intracranial hemorrhage. No mass effect or midline shift. There is no extra-axial fluid collection. Vascular: No hyperdense vessel or unexpected calcification. Skull: Normal. Negative for fracture or focal lesion. Sinuses/Orbits: No acute finding. Other: None CT CERVICAL SPINE FINDINGS Alignment: No acute subluxation. There is straightening of the normal cervical lordosis similar prior study. Skull base and vertebrae: No acute fracture. There is incomplete bony fusion of the posterior ring of C1. Soft tissues and spinal  canal: No prevertebral fluid or swelling. No visible canal hematoma. Disc levels: At C2-C3: Bilateral facet hypertrophy, right greater left with associated neural foramina narrowing on the right. At C3-C4 bilateral facet hypertrophy, left greater right there is associated narrowing of the central canal and bilateral neural foramina, left greater right. At C4-C5 there is bilateral facet hypertrophy with associated narrowing of the neural foramina. There is mild narrowing of the central canal at this level. At C5-C6 there is vertebral fusion. There is facet hypertrophy and associated neural foramina narrowing on the left. At C6-C7 bilateral facet hypertrophy, left greater right with bilateral neural foramina narrowing. There is moderate canal stenosis. At C7-T1 left facet hypertrophy. The neural foramina and central canal appear patent. Upper chest: The lung apices are clear. Other: None IMPRESSION: No acute intracranial pathology. No acute/ traumatic cervical spine pathology. Multilevel degenerative changes and disc disease with associated neural foramina and central canal narrowing as described and similar to the prior CT. Electronically Signed   By: Anner Crete M.D.   On: 09/07/2016 22:13   Ct Cervical Spine Wo Contrast  Result Date: 09/07/2016 CLINICAL DATA:  70 year old male with fall. EXAM: CT HEAD WITHOUT CONTRAST CT CERVICAL SPINE WITHOUT CONTRAST TECHNIQUE: Multidetector CT imaging of the head and cervical spine was performed following the standard protocol without intravenous contrast. Multiplanar CT image reconstructions of the cervical spine were also generated. COMPARISON:  Head CT dated 07/25/2016 FINDINGS: CT HEAD FINDINGS Brain: The ventricles and sulci are appropriate in size for patient's age. Minimal periventricular and deep white matter chronic microvascular ischemic changes noted. There is no acute intracranial hemorrhage. No mass effect or midline shift. There is no extra-axial fluid  collection. Vascular: No hyperdense vessel or unexpected calcification. Skull: Normal. Negative for fracture or focal lesion. Sinuses/Orbits: No acute finding. Other: None CT CERVICAL SPINE FINDINGS Alignment: No acute subluxation. There is straightening of the normal cervical lordosis similar prior study. Skull base and vertebrae: No acute fracture. There is incomplete bony fusion of the posterior ring of C1. Soft tissues and spinal canal: No prevertebral fluid or swelling. No visible canal hematoma. Disc levels: At C2-C3: Bilateral facet hypertrophy, right greater left with associated neural foramina narrowing on the right. At C3-C4 bilateral facet hypertrophy, left greater right there is associated narrowing of the central canal and bilateral neural foramina, left greater right. At C4-C5 there is bilateral facet hypertrophy with associated narrowing of the neural foramina. There is mild narrowing of the central canal at this level. At C5-C6 there is vertebral fusion. There is facet hypertrophy and associated neural foramina narrowing on the left. At C6-C7 bilateral facet hypertrophy, left greater right with bilateral neural  foramina narrowing. There is moderate canal stenosis. At C7-T1 left facet hypertrophy. The neural foramina and central canal appear patent. Upper chest: The lung apices are clear. Other: None IMPRESSION: No acute intracranial pathology. No acute/ traumatic cervical spine pathology. Multilevel degenerative changes and disc disease with associated neural foramina and central canal narrowing as described and similar to the prior CT. Electronically Signed   By: Anner Crete M.D.   On: 09/07/2016 22:13   Dg Chest Port 1 View  Result Date: 09/07/2016 CLINICAL DATA:  70 year old male with altered mental status EXAM: PORTABLE CHEST 1 VIEW COMPARISON:  Chest radiograph dated 07/25/2016 FINDINGS: There is stable mild eventration of the left hemidiaphragm with associated subsegmental atelectatic  changes of the left lung base. The right lung is clear. There is no pleural effusion or pneumothorax. Stable top-normal cardiac silhouette. No acute osseous pathology. IMPRESSION: No active disease. Electronically Signed   By: Anner Crete M.D.   On: 09/07/2016 23:44        Scheduled Meds: . enoxaparin (LOVENOX) injection  40 mg Subcutaneous Q24H  . meloxicam  7.5 mg Oral Daily  . pantoprazole  40 mg Oral BID  . traZODone  50 mg Oral QHS   Continuous Infusions:     LOS: 0 days     [facility], MD [facility] Pager [phone]  If 7PM-7AM, please contact night-coverage [email or web address] Password [email or web address] 09/08/2016, 1:41 PM

## 2016-09-09 LAB — COMPREHENSIVE METABOLIC PANEL
ALBUMIN: 3.1 g/dL — AB (ref 3.5–5.0)
ALT: 20 U/L (ref 17–63)
AST: 49 U/L — AB (ref 15–41)
Alkaline Phosphatase: 40 U/L (ref 38–126)
Anion gap: 7 (ref 5–15)
BUN: 9 mg/dL (ref 6–20)
CHLORIDE: 108 mmol/L (ref 101–111)
CO2: 25 mmol/L (ref 22–32)
Calcium: 8.4 mg/dL — ABNORMAL LOW (ref 8.9–10.3)
Creatinine, Ser: 0.85 mg/dL (ref 0.61–1.24)
GFR calc Af Amer: >60 mL/min (ref >60)
Glucose, Bld: 93 mg/dL (ref 65–99)
POTASSIUM: 3.5 mmol/L (ref 3.5–5.1)
Sodium: 140 mmol/L (ref 135–145)
Total Bilirubin: 0.4 mg/dL (ref 0.3–1.2)
Total Protein: 5.8 g/dL — ABNORMAL LOW (ref 6.5–8.1)

## 2016-09-09 LAB — CBC
HEMATOCRIT: 35.9 % — AB (ref 39.0–52.0)
HEMOGLOBIN: 11.2 g/dL — AB (ref 13.0–17.0)
MCH: 26.7 pg (ref 26.0–34.0)
MCHC: 31.2 g/dL (ref 30.0–36.0)
MCV: 85.5 fL (ref 78.0–100.0)
Platelets: 287 10*3/uL (ref 150–400)
RBC: 4.2 MIL/uL — ABNORMAL LOW (ref 4.22–5.81)
RDW: 15.2 % (ref 11.5–15.5)
WBC: 5.1 10*3/uL (ref 4.0–10.5)

## 2016-09-09 LAB — CK: CK TOTAL: 2075 U/L — AB (ref 49–397)

## 2016-09-09 MED ORDER — SODIUM CHLORIDE 0.9 % IV SOLN
INTRAVENOUS | Status: AC
  Administered 2016-09-09 – 2016-09-10 (×3): via INTRAVENOUS

## 2016-09-09 NOTE — Progress Notes (Addendum)
PROGRESS NOTE  RAJIV PARLATO  WUJ:[id number] DOB: 20-Apr-1946  DOA: 09/07/2016 PCP: PROVIDER NOT IN SYSTEM   Brief Narrative:  70 year old male with a PMH of chronic unspecified mental illness, GERD, HTN, chronic back pain presented to [facility] ED on 09/07/16 with a clinical picture of acute encephalopathy after being found down at home. Patient reportedly lives alone and his daughter was unable to reach him by phone for several days and requested EMS performed a welfare check. He was found on the floor of his home, naked and incontinent, alert but not oriented. History unobtainable from patient. He has been admitted with acute encephalopathy in the past, most recently in September 2017 when he had a UTI. He was discharged to SNF and then home. As per family, he is unable to manage his medications or care for himself even at his baseline best. Significant workup in ED showed CK 1187, CT head and C-spine without acute pathology. He was admitted for evaluation and management of acute encephalopathy and mild rhabdomyolysis.   Assessment & Plan:   Principal Problem:   Acute encephalopathy Active Problems:   GERD without esophagitis   Bilateral lower extremity edema   Generalized weakness   Essential hypertension   Rhabdomyolysis   Normocytic anemia   Altered mental status   1. Acute encephalopathy: Patient unable to provide any history since arrival to the hospital. As per family, patient is unable to take care of himself at home. Etiology unclear. CT head and C-spine without acute findings. No focal deficits on exam. Urine microscopy not suggestive of UTI. CMP normal. Ammonia normal. TSH normal. B12: 274. RPR negative. Blood alcohol level and UDS negative.? Related to medications he might have taken at home. Although low index of suspicion, we will obtain EEG to look for seizure disorder (ordered). Monitor clinically. Minimize/Avoid sedative medications. ESR 19, HIV nonreactive. Mental status has  definitely improved. Patient is conversant, alert and oriented to self and place but is unable to recollect or provide any details as to events leading to his admission. 2. Rhabdomyolysis: Likely related to being down on the floor. CK has increased from 1187 > 2669. Aggressive IV fluids and follow CK. Imaging results as below do not show any acute injuries. CK starting to decrease, 2075. Continue IV fluids and follow CK in a.m. 3. Essential hypertension: Mildly uncontrolled. HCTZ held due to rhabdomyolysis. When necessary IV hydralazine. DC HCTZ indefinitely. 4. GERD: PPI. 5. Bilateral lower extremity edema: Likely nutritional. Checking lower extremity venous ultrasound due to asymmetry i.e. left greater than right 6. Adult failure to thrive/falls at home: No clear history surrounding the recent event. PT recommends SNF and may need long-term placement. 7. Iron deficiency anemia: Ferritin: 11, iron 62. Consider outpatient evaluation for etiology. May need to undergo GI workup if not done. Follow CBCs periodically. 8. Abnormal TFTs: TSH normal but free T4 mildly elevated. Clinically euthyroid.? Sick euthyroid. Repeat TFTs in 4-6 weeks.    DVT prophylaxis: Lovenox Code Status: Full Family Communication: None at bedside. Unsuccessful attempt to reach patient's spouse on number available. Left message for patient's daughter to call back. Disposition Plan: DC to SNF when medically stable   Consultants:   None  Procedures:   None  Antimicrobials:   None    Subjective: Alert, oriented to self and place, converse and. Denies physical complaints. Unable to recollect events leading to his hospitalization. Per RN, confused at times.  Objective:  Vitals:   09/08/16 0145 09/08/16 9562 09/08/16 1457 09/08/16 2004  BP: (!) 162/79 (!) 159/71 117/64 110/68  Pulse: 100 (!) 103 88 79  Resp: 18 19 20 16   Temp: 98.4 F (36.9 C) 98.8 F (37.1 C) 98.9 F (37.2 C) 98.9 F (37.2 C)  TempSrc: Oral  Oral Oral   SpO2: 100% 99% 97% 100%  Weight:      Height:        Intake/Output Summary (Last 24 hours) at 09/09/16 0747 Last data filed at 09/09/16 0741  Gross per 24 hour  Intake          3264.83 ml  Output                0 ml  Net          3264.83 ml   Filed Weights   09/07/16 1852  Weight: 83.9 kg (185 lb)    Examination:  General exam: Pleasant elderly male lying comfortably propped up in bed. Respiratory system: Clear to auscultation. Respiratory effort normal. Cardiovascular system: S1 & S2 heard, RRR. No JVD, murmurs, rubs, gallops or clicks.  Gastrointestinal system: Abdomen is nondistended, soft and nontender. No organomegaly or masses felt. Normal bowel sounds heard. Central nervous system: Alert and oriented 2. No focal neurological deficits. Extremities: moving all extremities symmetrically. 1+ pitting bilateral leg edema, left >right. No acute findings.  Skin: No rashes, lesions or ulcers Psychiatry: Judgement and insight impaired.     Data Reviewed: I have personally reviewed following labs and imaging studies  CBC:  Recent Labs Lab 09/07/16 1920 09/09/16 0332  WBC 8.7 5.1  NEUTROABS 6.7  --   HGB 12.7* 11.2*  HCT 38.2* 35.9*  MCV 83.4 85.5  PLT 340 093   Basic Metabolic Panel:  Recent Labs Lab 09/07/16 1920 09/09/16 0332  NA 141 140  K 4.0 3.5  CL 105 108  CO2 26 25  GLUCOSE 90 93  BUN 13 9  CREATININE 0.76 0.85  CALCIUM 9.2 8.4*   GFR: Estimated Creatinine Clearance: 80.9 mL/min (by C-G formula based on SCr of 0.85 mg/dL). Liver Function Tests:  Recent Labs Lab 09/07/16 1920 09/09/16 0332  AST 35 49*  ALT 18 20  ALKPHOS 45 40  BILITOT 0.9 0.4  PROT 7.0 5.8*  ALBUMIN 4.0 3.1*   No results for input(s): LIPASE, AMYLASE in the last 168 hours.  Recent Labs Lab 09/08/16 0216  AMMONIA 28   Coagulation Profile: No results for input(s): INR, PROTIME in the last 168 hours. Cardiac Enzymes:  Recent Labs Lab 09/07/16 1920  09/08/16 0216 09/09/16 0332  CKTOTAL 1,187* 2,669* 2,075*   BNP (last 3 results) No results for input(s): PROBNP in the last 8760 hours. HbA1C: No results for input(s): HGBA1C in the last 72 hours. CBG: No results for input(s): GLUCAP in the last 168 hours. Lipid Profile: No results for input(s): CHOL, HDL, LDLCALC, TRIG, CHOLHDL, LDLDIRECT in the last 72 hours. Thyroid Function Tests:  Recent Labs  09/08/16 0216  TSH 3.056  FREET4 1.29*   Anemia Panel:  Recent Labs  09/08/16 0216  VITAMINB12 274  FERRITIN 11*  TIBC 342  IRON 62    Sepsis Labs:  Recent Labs Lab 09/07/16 1949  LATICACIDVEN 1.61    No results found for this or any previous visit (from the past 240 hour(s)).       Radiology Studies: Dg Lumbar Spine Complete  Result Date: 09/07/2016 CLINICAL DATA:  Fall.  Back pain. EXAM: LUMBAR SPINE - COMPLETE 4+ VIEW COMPARISON:  Lumbar spine MRI 03/21/2016.  X-rays from 03/20/2016. FINDINGS: No evidence of fracture. No subluxation. Loss of disc height at L1-2, diffuse loss of intervertebral disc height the lumbar spine again noted. Spondylolisthesis at L4-5 is stable. SI joints are unremarkable. IMPRESSION: No evidence for lumbar spine fracture. Degenerative disc disease. Electronically Signed   By: Misty Stanley M.D.   On: 09/07/2016 20:37   Ct Head Wo Contrast  Result Date: 09/07/2016 CLINICAL DATA:  70 year old male with fall. EXAM: CT HEAD WITHOUT CONTRAST CT CERVICAL SPINE WITHOUT CONTRAST TECHNIQUE: Multidetector CT imaging of the head and cervical spine was performed following the standard protocol without intravenous contrast. Multiplanar CT image reconstructions of the cervical spine were also generated. COMPARISON:  Head CT dated 07/25/2016 FINDINGS: CT HEAD FINDINGS Brain: The ventricles and sulci are appropriate in size for patient's age. Minimal periventricular and deep white matter chronic microvascular ischemic changes noted. There is no acute  intracranial hemorrhage. No mass effect or midline shift. There is no extra-axial fluid collection. Vascular: No hyperdense vessel or unexpected calcification. Skull: Normal. Negative for fracture or focal lesion. Sinuses/Orbits: No acute finding. Other: None CT CERVICAL SPINE FINDINGS Alignment: No acute subluxation. There is straightening of the normal cervical lordosis similar prior study. Skull base and vertebrae: No acute fracture. There is incomplete bony fusion of the posterior ring of C1. Soft tissues and spinal canal: No prevertebral fluid or swelling. No visible canal hematoma. Disc levels: At C2-C3: Bilateral facet hypertrophy, right greater left with associated neural foramina narrowing on the right. At C3-C4 bilateral facet hypertrophy, left greater right there is associated narrowing of the central canal and bilateral neural foramina, left greater right. At C4-C5 there is bilateral facet hypertrophy with associated narrowing of the neural foramina. There is mild narrowing of the central canal at this level. At C5-C6 there is vertebral fusion. There is facet hypertrophy and associated neural foramina narrowing on the left. At C6-C7 bilateral facet hypertrophy, left greater right with bilateral neural foramina narrowing. There is moderate canal stenosis. At C7-T1 left facet hypertrophy. The neural foramina and central canal appear patent. Upper chest: The lung apices are clear. Other: None IMPRESSION: No acute intracranial pathology. No acute/ traumatic cervical spine pathology. Multilevel degenerative changes and disc disease with associated neural foramina and central canal narrowing as described and similar to the prior CT. Electronically Signed   By: Anner Crete M.D.   On: 09/07/2016 22:13   Ct Cervical Spine Wo Contrast  Result Date: 09/07/2016 CLINICAL DATA:  70 year old male with fall. EXAM: CT HEAD WITHOUT CONTRAST CT CERVICAL SPINE WITHOUT CONTRAST TECHNIQUE: Multidetector CT imaging  of the head and cervical spine was performed following the standard protocol without intravenous contrast. Multiplanar CT image reconstructions of the cervical spine were also generated. COMPARISON:  Head CT dated 07/25/2016 FINDINGS: CT HEAD FINDINGS Brain: The ventricles and sulci are appropriate in size for patient's age. Minimal periventricular and deep white matter chronic microvascular ischemic changes noted. There is no acute intracranial hemorrhage. No mass effect or midline shift. There is no extra-axial fluid collection. Vascular: No hyperdense vessel or unexpected calcification. Skull: Normal. Negative for fracture or focal lesion. Sinuses/Orbits: No acute finding. Other: None CT CERVICAL SPINE FINDINGS Alignment: No acute subluxation. There is straightening of the normal cervical lordosis similar prior study. Skull base and vertebrae: No acute fracture. There is incomplete bony fusion of the posterior ring of C1. Soft tissues and spinal canal: No prevertebral fluid or swelling. No visible canal hematoma. Disc levels: At C2-C3: Bilateral facet  hypertrophy, right greater left with associated neural foramina narrowing on the right. At C3-C4 bilateral facet hypertrophy, left greater right there is associated narrowing of the central canal and bilateral neural foramina, left greater right. At C4-C5 there is bilateral facet hypertrophy with associated narrowing of the neural foramina. There is mild narrowing of the central canal at this level. At C5-C6 there is vertebral fusion. There is facet hypertrophy and associated neural foramina narrowing on the left. At C6-C7 bilateral facet hypertrophy, left greater right with bilateral neural foramina narrowing. There is moderate canal stenosis. At C7-T1 left facet hypertrophy. The neural foramina and central canal appear patent. Upper chest: The lung apices are clear. Other: None IMPRESSION: No acute intracranial pathology. No acute/ traumatic cervical spine  pathology. Multilevel degenerative changes and disc disease with associated neural foramina and central canal narrowing as described and similar to the prior CT. Electronically Signed   By: Anner Crete M.D.   On: 09/07/2016 22:13   Dg Chest Port 1 View  Result Date: 09/07/2016 CLINICAL DATA:  70 year old male with altered mental status EXAM: PORTABLE CHEST 1 VIEW COMPARISON:  Chest radiograph dated 07/25/2016 FINDINGS: There is stable mild eventration of the left hemidiaphragm with associated subsegmental atelectatic changes of the left lung base. The right lung is clear. There is no pleural effusion or pneumothorax. Stable top-normal cardiac silhouette. No acute osseous pathology. IMPRESSION: No active disease. Electronically Signed   By: Anner Crete M.D.   On: 09/07/2016 23:44        Scheduled Meds: . enoxaparin (LOVENOX) injection  40 mg Subcutaneous Q24H  . meloxicam  7.5 mg Oral Daily  . pantoprazole  40 mg Oral BID   Continuous Infusions: . sodium chloride       LOS: 1 day     [facility], MD [facility] Pager [phone]  If 7PM-7AM, please contact night-coverage [email or web address] Password [email or web address] 09/09/2016, 7:47 AM

## 2016-09-10 ENCOUNTER — Inpatient Hospital Stay (HOSPITAL_COMMUNITY): Payer: Medicare Other

## 2016-09-10 DIAGNOSIS — M7989 Other specified soft tissue disorders: Secondary | ICD-10-CM

## 2016-09-10 DIAGNOSIS — G9340 Encephalopathy, unspecified: Secondary | ICD-10-CM

## 2016-09-10 LAB — FOLATE RBC
Folate, Hemolysate: 298.3 ng/mL
Folate, RBC: 836 ng/mL (ref >498)
Hematocrit: 35.7 % — ABNORMAL LOW (ref 37.5–51.0)

## 2016-09-10 LAB — CK: CK TOTAL: 1245 U/L — AB (ref 49–397)

## 2016-09-10 MED ORDER — AMLODIPINE BESYLATE 5 MG PO TABS
5.0000 mg | ORAL_TABLET | Freq: Every day | ORAL | Status: DC
  Administered 2016-09-10 – 2016-09-13 (×4): 5 mg via ORAL
  Filled 2016-09-10 (×4): qty 1

## 2016-09-10 NOTE — Progress Notes (Signed)
EEG completed, results pending. 

## 2016-09-10 NOTE — Progress Notes (Signed)
Patient ID: Evan Charles, male   DOB: July 31, 1946, 70 y.o.   MRN: [id number]  PROGRESS NOTE    MARQUELLE WHITTINGTON  [id number] DOB: [birth year] DOA: 09/07/2016  PCP: PROVIDER NOT IN SYSTEM   Brief Narrative:  70 year old male with past medical history of chronic unspecified mental illness, GERD, HTN, chronic back pain who presented to [facility] ED on 09/07/16 with a clinical picture of acute encephalopathy after being found down at home. Pt was found on the floor of his home, naked and incontinent, alert but not oriented. He has been admitted with acute encephalopathy in the past, most recently in September 2017 when he had a UTI. He was discharged to SNF and then home. As per family, he is unable to manage his medications or care for himself even at his baseline best. Significant workup in ED showed CK 1187, CT head and C-spine showed no acute pathology.   Assessment & Plan:  Acute metabolic encephalopathy: - Unclear etiology, he does have unspecified chronic mental illness it is just not clear what exactly is his psychiatric history - CT head and cervical spine without acute findings - Urine microscopy not suggestive of UTI. CMP normal. Ammonia normal. TSH normal. B12: 274. RPR negative - Blood alcohol level and UDS negative - Per PT evaluation, recommendation for skilled nursing facility placement, appreciate social work assistance with discharge planning  Rhabdomyolysis related to trauma - Likely in the setting of being on the floor for unknown period of time - CK levels improving with hydration - CK this am 1245  Essential hypertension - Start Norvasc daily   Bilateral lower extremity edema - Likely nutritional - Checking lower extremity venous ultrasound  Adult failure to thrive/falls at home - Per PT - SNF recommended   Iron deficiency anemia - Ferritin: 11, iron 62 - Hgb 11.2, stable overall   Abnormal TFTs - SH normal but free T4 mildly elevated.  - Clinically  euthyroid. - Repeat TFTs in 4-6 weeks.     DVT prophylaxis: Lovenox subcutaneous Code Status: full code  Family Communication: No family at the bedside Disposition Plan: To skilled nursing facility based on PT evaluation   Consultants:   None  Procedures:   None   Antimicrobials:   None    Subjective: No overnight events  Objective: Vitals:   09/09/16 1524 09/09/16 1851 09/09/16 2139 09/10/16 0523  BP: (!) 152/100  (!) 160/104 (!) 173/87  Pulse: 76  73 74  Resp:   18 18  Temp: 97.7 F (36.5 C)  98 F (36.7 C) 97.5 F (36.4 C)  TempSrc: Oral  Oral Oral  SpO2: 100% 100% 100% 99%  Weight:    90.5 kg (199 lb 8.3 oz)  Height:        Intake/Output Summary (Last 24 hours) at 09/10/16 1247 Last data filed at 09/10/16 0950  Gross per 24 hour  Intake          1736.25 ml  Output             5450 ml  Net         -3713.75 ml   Filed Weights   09/07/16 1852 09/09/16 0500 09/10/16 0523  Weight: 83.9 kg (185 lb) 89.9 kg (198 lb 3.1 oz) 90.5 kg (199 lb 8.3 oz)    Examination:  General exam: Appears calm and comfortable  Respiratory system: Clear to auscultation. Respiratory effort normal. Cardiovascular system: S1 & S2 heard, RRR. No JVD, murmurs, rubs, gallops or clicks. No  pedal edema. Gastrointestinal system: Abdomen is nondistended, soft and nontender. No organomegaly or masses felt. Normal bowel sounds heard. Central nervous system: Alert but disoriented, thinks he is in [employer]  Extremities: Lower extremity edema, no tenderness  Skin: No rashes, lesions or ulcers Psychiatry: Judgement and insight appear normal. Mood & affect appropriate.   Data Reviewed: I have personally reviewed following labs and imaging studies  CBC:  Recent Labs Lab 09/07/16 1920 09/09/16 0332  WBC 8.7 5.1  NEUTROABS 6.7  --   HGB 12.7* 11.2*  HCT 38.2* 35.9*  MCV 83.4 85.5  PLT 340 [id number]   Basic Metabolic Panel:  Recent Labs Lab 09/07/16 1920 09/09/16 0332  NA 141 140   K 4.0 3.5  CL 105 108  CO2 26 25  GLUCOSE 90 93  BUN 13 9  CREATININE 0.76 0.85  CALCIUM 9.2 8.4*   GFR: Estimated Creatinine Clearance: 89.9 mL/min (by C-G formula based on SCr of 0.85 mg/dL). Liver Function Tests:  Recent Labs Lab 09/07/16 1920 09/09/16 0332  AST 35 49*  ALT 18 20  ALKPHOS 45 40  BILITOT 0.9 0.4  PROT 7.0 5.8*  ALBUMIN 4.0 3.1*   No results for input(s): LIPASE, AMYLASE in the last 168 hours.  Recent Labs Lab 09/08/16 0216  AMMONIA 28   Coagulation Profile: No results for input(s): INR, PROTIME in the last 168 hours. Cardiac Enzymes:  Recent Labs Lab 09/07/16 1920 09/08/16 0216 09/09/16 0332 09/10/16 0305  CKTOTAL 1,187* 2,669* 2,075* 1,245*   BNP (last 3 results) No results for input(s): PROBNP in the last 8760 hours. HbA1C: No results for input(s): HGBA1C in the last 72 hours. CBG: No results for input(s): GLUCAP in the last 168 hours. Lipid Profile: No results for input(s): CHOL, HDL, LDLCALC, TRIG, CHOLHDL, LDLDIRECT in the last 72 hours. Thyroid Function Tests:  Recent Labs  09/08/16 0216  TSH 3.056  FREET4 1.29*   Anemia Panel:  Recent Labs  09/08/16 0216  VITAMINB12 274  FERRITIN 11*  TIBC 342  IRON 62   Urine analysis:    Component Value Date/Time   COLORURINE YELLOW 09/07/2016 1938   APPEARANCEUR CLEAR 09/07/2016 1938   LABSPEC 1.023 09/07/2016 1938   PHURINE 6.0 09/07/2016 1938   GLUCOSEU NEGATIVE 09/07/2016 1938   HGBUR NEGATIVE 09/07/2016 1938   BILIRUBINUR SMALL (A) 09/07/2016 1938   KETONESUR 40 (A) 09/07/2016 1938   PROTEINUR NEGATIVE 09/07/2016 1938   UROBILINOGEN 0.2 02/13/2015 1957   NITRITE NEGATIVE 09/07/2016 1938   LEUKOCYTESUR NEGATIVE 09/07/2016 1938   Sepsis Labs: @LABRCNTIP (procalcitonin:4,lacticidven:4)   )No results found for this or any previous visit (from the past 240 hour(s)).    Radiology Studies: Dg Lumbar Spine Complete Result Date: 09/07/2016 No evidence for lumbar  spine fracture. Degenerative disc disease.  Ct Head Wo Contrast Result Date: 09/07/2016 No acute intracranial pathology. No acute/ traumatic cervical spine pathology. Multilevel degenerative changes and disc disease with associated neural foramina and central canal narrowing as described and similar to the prior CT.   Ct Cervical Spine Wo Contrast Result Date: 09/07/2016 No acute intracranial pathology. No acute/ traumatic cervical spine pathology. Multilevel degenerative changes and disc disease with associated neural foramina and central canal narrowing as described and similar to the prior CT.  Dg Chest Port 1 View Result Date: 09/07/2016 No active disease.    Scheduled Meds: . enoxaparin (LOVENOX) injection  40 mg Subcutaneous Q24H  . meloxicam  7.5 mg Oral Daily  . pantoprazole  40 mg  Oral BID   Continuous Infusions:    LOS: 2 days    Time spent: 25 minutes  Greater than 50% of the time spent on counseling and coordinating the care.   Leisa Lenz, MD [facility] Pager [phone]  If 7PM-7AM, please contact night-coverage [email or web address] Password [email or web address] 09/10/2016, 12:47 PM

## 2016-09-10 NOTE — Procedures (Signed)
ELECTROENCEPHALOGRAM REPORT  Date of Study: 09/10/2016  Patient's Name: Evan Charles MRN: [id number] Date of Birth: 1946-07-28  Referring Provider: Dr. Vernell Leep  Clinical History: This is a 70 year old man with acute encephalopathy.  Medications: acetaminophen (TYLENOL) tablet 650 mg  bisacodyl (DULCOLAX) suppository 10 mg  docusate sodium (COLACE) capsule 100 mg  enoxaparin (LOVENOX) injection 40 mg  hydrALAZINE (APRESOLINE) injection 10 mg  ipratropium-albuterol (DUONEB) 0.5-2.5 (3) MG/3ML nebulizer solution 3 mL  meloxicam (MOBIC) tablet 7.5 mg  pantoprazole (PROTONIX) EC tablet 40 mg   Technical Summary: A multichannel digital EEG recording measured by the international 10-20 system with electrodes applied with paste and impedances below 5000 ohms performed as portable with EKG monitoring in an awake and asleep patient.  Hyperventilation and photic stimulation were not performed.  The digital EEG was referentially recorded, reformatted, and digitally filtered in a variety of bipolar and referential montages for optimal display.   Description: The patient is awake and asleep during the recording.  During maximal wakefulness, there is a poorly sustained symmetric, medium voltage 7 Hz posterior dominant rhythm that attenuates with eye opening. This is admixed with a small amount of diffuse 4-6 Hz theta and occasional diffuse 2-3 Hz delta slowing of the waking background.  During drowsiness and sleep, there is an increase in theta and delta slowing of the background. Normal sleep architecture is not seen. Hyperventilation and photic stimulation were not performed.  There were no epileptiform discharges or electrographic seizures seen.    There were technical difficulties with EKG lead.  Impression: This awake and asleep EEG is abnormal due to moderate diffuse slowing of the waking background.  Clinical Correlation of the above findings indicates diffuse cerebral dysfunction  that is non-specific in etiology and can be seen with hypoxic/ischemic injury, toxic/metabolic encephalopathies, neurodegenerative disorders, or medication effect.  The absence of epileptiform discharges does not rule out a clinical diagnosis of epilepsy.  Clinical correlation is advised.   Ellouise Newer, M.D.

## 2016-09-10 NOTE — Progress Notes (Signed)
*  Preliminary Results* Bilateral lower extremity venous duplex completed. Bilateral lower extremities are negative for deep vein thrombosis. There is no evidence of Baker's cyst bilaterally.  09/10/2016 1:52 PM Maudry Mayhew, BS, RVT, RDCS, RDMS

## 2016-09-10 NOTE — Progress Notes (Signed)
Physical Therapy Treatment Patient Details Name: Evan Charles MRN: [id number] DOB: Jun 27, 1946 Today's Date: 09/10/2016    History of Present Illness Pt is a 70 y/o male admitted after falling at home. PMH including but not limited to chronic mental illness (unspecified), chornic back pain and HTN.    PT Comments    Patient is progressing toward mobility goals. Pt demonstrated confusion during session. Continue to progress as tolerated with anticipated d/c to SNF for further skilled PT services.    Follow Up Recommendations  SNF;Supervision/Assistance - 24 hour     Equipment Recommendations  None recommended by PT    Recommendations for Other Services       Precautions / Restrictions Precautions Precautions: Fall Restrictions Weight Bearing Restrictions: No    Mobility  Bed Mobility Overal bed mobility: Needs Assistance Bed Mobility: Sit to Supine       Sit to supine: Min assist   General bed mobility comments: pt OOB in chair upon arrival; returned to supine for transport to procedure/test; assist to position in bed but no physical assist to get into bed; increased time and effort  Transfers Overall transfer level: Needs assistance Equipment used: Rolling walker (2 wheeled) Transfers: Sit to/from Stand Sit to Stand: Mod assist;+2 safety/equipment         General transfer comment: from recliner X2; posterior lean in standing; cues for hand placement and technique; pt with tendency to kick R LE out just before trying to stand  Ambulation/Gait Ambulation/Gait assistance: Mod assist Ambulation Distance (Feet): 50 Feet Assistive device: Rolling walker (2 wheeled) Gait Pattern/deviations: Step-through pattern;Decreased stride length;Narrow base of support;Trunk flexed;Drifts right/left;Decreased dorsiflexion - right;Decreased dorsiflexion - left     General Gait Details: pt with short steps and decreased step height; assist required for balance, weight  shifting, and management of RW; pt drifts to R side   Stairs            Wheelchair Mobility    Modified Rankin (Stroke Patients Only)       Balance     Sitting balance-Leahy Scale: Poor       Standing balance-Leahy Scale: Poor                      Cognition Arousal/Alertness: Awake/alert Behavior During Therapy: WFL for tasks assessed/performed Overall Cognitive Status: History of cognitive impairments - at baseline Area of Impairment: Orientation;Problem solving Orientation Level: Situation   Memory: Decreased short-term memory       Problem Solving: Difficulty sequencing;Requires verbal cues General Comments: pt confused at times during session; pt able to state we are at [facility] but when asked why he came here he stated "I was invited for lunch and dinner" and reported that we are at the main entrance when at doorway of room     Exercises      General Comments        Pertinent Vitals/Pain Pain Assessment: No/denies pain    Home Living                      Prior Function            PT Goals (current goals can now be found in the care plan section) Acute Rehab PT Goals Patient Stated Goal: none stated Progress towards PT goals: Progressing toward goals    Frequency    Min 3X/week      PT Plan Current plan remains appropriate    Co-evaluation  End of Session Equipment Utilized During Treatment: Gait belt Activity Tolerance: Patient tolerated treatment well Patient left: in bed;with call bell/phone within reach     Time: [id number] PT Time Calculation (min) (ACUTE ONLY): 19 min  Charges:  $Gait Training: 8-22 mins                    G Codes:      Salina April, PTA Pager: [phone]   09/10/2016, 1:13 PM

## 2016-09-11 DIAGNOSIS — D508 Other iron deficiency anemias: Secondary | ICD-10-CM

## 2016-09-11 LAB — CK: Total CK: 850 U/L — ABNORMAL HIGH (ref 49–397)

## 2016-09-11 MED ORDER — LORAZEPAM 2 MG/ML IJ SOLN
0.5000 mg | Freq: Once | INTRAMUSCULAR | Status: AC
  Administered 2016-09-11: 0.5 mg via INTRAVENOUS
  Filled 2016-09-11: qty 1

## 2016-09-11 NOTE — Progress Notes (Addendum)
Patient ID: Evan Charles, male   DOB: [birth year], 70 y.o.   MRN: [id number]  PROGRESS NOTE    Evan Charles  [id number] DOB: 07-04-1946 DOA: 09/07/2016  PCP: PROVIDER NOT IN SYSTEM   Brief Narrative:  70 year old male with past medical history of chronic unspecified mental illness, GERD, HTN, chronic back pain who presented to [facility] ED on 09/07/16 with a clinical picture of acute encephalopathy after being found down at home. Pt was found on the floor of his home, naked and incontinent, alert but not oriented. He has been admitted with acute encephalopathy in the past, most recently in September 2017 when he had a UTI. He was discharged to SNF and then home. As per family, he is unable to manage his medications or care for himself even at his baseline best. Significant workup in ED showed CK 1187, CT head and C-spine showed no acute pathology.   Assessment & Plan:  Acute metabolic encephalopathy: - Unclear etiology, he does have unspecified chronic mental illness it is just not clear what exactly is his psychiatric history - CT head and cervical spine without acute findings - EEG showed diffuse slowing, no specific etiology  - Urine microscopy not suggestive of UTI. CMP normal. Ammonia normal. TSH normal. B12: 274. RPR negative - Blood alcohol level and UDS negative - Per PT evaluation, recommendation for skilled nursing facility placement, appreciate social work assistance with discharge planning  Rhabdomyolysis related to trauma - Likely in the setting of being on the floor for unknown period of time - CK levels improving with hydration - CK level trending down: 2669 --> 850  Essential hypertension - Started Norvasc daily  - BP now 145/82  Bilateral lower extremity edema - Likely nutritional - Improved this am with minimal nonpitting edema bilaterally  - LE doppler - no DVT   Adult failure to thrive/falls at home - Per PT - SNF recommended   Normocytic anemia -  Ferritin: 11, iron 62 - Hgb 11.2, stable overall   Abnormal TFTs - SH normal but free T4 mildly elevated.  - Clinically euthyroid. - Repeat TFTs in 4-6 weeks.     DVT prophylaxis: Lovenox subcutaneous Code Status: full code  Family Communication: No family at the bedside Disposition Plan: To skilled nursing facility once bed available.    Consultants:   None  Procedures:   LE doppler 09/10/2016 - No DVT  Antimicrobials:   None    Subjective: No overnight events  Objective: Vitals:   09/10/16 1500 09/10/16 2155 09/11/16 0500 09/11/16 0514  BP: (!) 159/93 (!) 141/84  (!) 145/82  Pulse:  86  79  Resp:    17  Temp:  98.2 F (36.8 C)  98.6 F (37 C)  TempSrc:  Oral  Oral  SpO2:  98%  98%  Weight:   85.8 kg (189 lb 2.5 oz)   Height:        Intake/Output Summary (Last 24 hours) at 09/11/16 1059 Last data filed at 09/10/16 2000  Gross per 24 hour  Intake              600 ml  Output              850 ml  Net             -250 ml   Filed Weights   09/09/16 0500 09/10/16 0523 09/11/16 0500  Weight: 89.9 kg (198 lb 3.1 oz) 90.5 kg (199 lb 8.3 oz) 85.8 kg (189  lb 2.5 oz)    Examination:  General exam: Appears calm and comfortable, no distress  Respiratory system: No wheezing, no rhonchi Cardiovascular system: S1 & S2 heard, Rate controlled  Gastrointestinal system: (+) BS, non tender abdomen  Central nervous system: oriented to self only Extremities: Lower extremity edema non pitting and much better since yesterday, no tenderness  Skin: warm and dry  Psychiatry: Normal mood and behavior    Data Reviewed: I have personally reviewed following labs and imaging studies  CBC:  Recent Labs Lab 09/07/16 1920 09/08/16 0216 09/09/16 0332  WBC 8.7  --  5.1  NEUTROABS 6.7  --   --   HGB 12.7*  --  11.2*  HCT 38.2* 35.7* 35.9*  MCV 83.4  --  85.5  PLT 340  --  [id number]   Basic Metabolic Panel:  Recent Labs Lab 09/07/16 1920 09/09/16 0332  NA 141 140  K 4.0  3.5  CL 105 108  CO2 26 25  GLUCOSE 90 93  BUN 13 9  CREATININE 0.76 0.85  CALCIUM 9.2 8.4*   GFR: Estimated Creatinine Clearance: 87.7 mL/min (by C-G formula based on SCr of 0.85 mg/dL). Liver Function Tests:  Recent Labs Lab 09/07/16 1920 09/09/16 0332  AST 35 49*  ALT 18 20  ALKPHOS 45 40  BILITOT 0.9 0.4  PROT 7.0 5.8*  ALBUMIN 4.0 3.1*   No results for input(s): LIPASE, AMYLASE in the last 168 hours.  Recent Labs Lab 09/08/16 0216  AMMONIA 28   Coagulation Profile: No results for input(s): INR, PROTIME in the last 168 hours. Cardiac Enzymes:  Recent Labs Lab 09/07/16 1920 09/08/16 0216 09/09/16 0332 09/10/16 0305 09/11/16 0543  CKTOTAL 1,187* 2,669* 2,075* 1,245* 850*   BNP (last 3 results) No results for input(s): PROBNP in the last 8760 hours. HbA1C: No results for input(s): HGBA1C in the last 72 hours. CBG: No results for input(s): GLUCAP in the last 168 hours. Lipid Profile: No results for input(s): CHOL, HDL, LDLCALC, TRIG, CHOLHDL, LDLDIRECT in the last 72 hours. Thyroid Function Tests: No results for input(s): TSH, T4TOTAL, FREET4, T3FREE, THYROIDAB in the last 72 hours. Anemia Panel: No results for input(s): VITAMINB12, FOLATE, FERRITIN, TIBC, IRON, RETICCTPCT in the last 72 hours. Urine analysis:    Component Value Date/Time   COLORURINE YELLOW 09/07/2016 1938   APPEARANCEUR CLEAR 09/07/2016 1938   LABSPEC 1.023 09/07/2016 1938   PHURINE 6.0 09/07/2016 1938   GLUCOSEU NEGATIVE 09/07/2016 1938   HGBUR NEGATIVE 09/07/2016 1938   BILIRUBINUR SMALL (A) 09/07/2016 1938   KETONESUR 40 (A) 09/07/2016 1938   PROTEINUR NEGATIVE 09/07/2016 1938   UROBILINOGEN 0.2 02/13/2015 1957   NITRITE NEGATIVE 09/07/2016 1938   LEUKOCYTESUR NEGATIVE 09/07/2016 1938   Sepsis Labs: @LABRCNTIP (procalcitonin:4,lacticidven:4)   )No results found for this or any previous visit (from the past 240 hour(s)).    Radiology Studies: Dg Lumbar Spine  Complete Result Date: 09/07/2016 No evidence for lumbar spine fracture. Degenerative disc disease.  Ct Head Wo Contrast Result Date: 09/07/2016 No acute intracranial pathology. No acute/ traumatic cervical spine pathology. Multilevel degenerative changes and disc disease with associated neural foramina and central canal narrowing as described and similar to the prior CT.   Ct Cervical Spine Wo Contrast Result Date: 09/07/2016 No acute intracranial pathology. No acute/ traumatic cervical spine pathology. Multilevel degenerative changes and disc disease with associated neural foramina and central canal narrowing as described and similar to the prior CT.  Dg Chest Port 1 View Result  Date: 09/07/2016 No active disease.    Scheduled Meds: . amLODipine  5 mg Oral Daily  . enoxaparin (LOVENOX) injection  40 mg Subcutaneous Q24H  . meloxicam  7.5 mg Oral Daily  . pantoprazole  40 mg Oral BID   Continuous Infusions:    LOS: 3 days    Time spent: 15 minutes  Greater than 50% of the time spent on counseling and coordinating the care.   Leisa Lenz, MD [facility] Pager [phone]  If 7PM-7AM, please contact night-coverage [email or web address] Password [email or web address] 09/11/2016, 10:59 AM

## 2016-09-11 NOTE — NC FL2 (Signed)
[place] LEVEL OF CARE SCREENING TOOL     IDENTIFICATION  Patient Name: Evan Charles Birthdate: June 23, 1946 Sex: male Admission Date (Current Location): 09/07/2016  [facility] and [place] Number:  Herbalist and Address:  [facility] [place]. [facility], [place] [address] [place], [place] [address]      Provider Number: [id number]  Attending Physician Name and Address:  Robbie Lis, MD  Relative Name and Phone Number:  [name] daughter, [phone]    Current Level of Care: Hospital Recommended Level of Care: [place] Prior Approval Number:    Date Approved/Denied:   PASRR Number: [id number]  Discharge Plan: SNF    Current Diagnoses: Patient Active Problem List   Diagnosis Date Noted  . Acute encephalopathy 09/08/2016  . Rhabdomyolysis 09/08/2016  . Normocytic anemia 09/08/2016  . Altered mental status 09/08/2016  . Fall at home   . UTI (lower urinary tract infection) 07/25/2016  . Falls 07/25/2016  . Mild cognitive impairment 07/25/2016  . 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 Immobility 03/20/2016  . Generalized weakness 03/20/2016  . Failure to thrive in adult 03/20/2016  . Neuropathy ([place])   . Essential hypertension   . Physical deconditioning   . 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 GERD without esophagitis 12/19/2015  . Bilateral lower extremity edema 12/19/2015  . Intractable low back pain 02/14/2015  . Difficulty walking 02/14/2015  . Prostate cancer ([place]) 02/14/2015  . H/O: upper GI bleed   . Chronic back pain   . Visual hallucinations   . Psychoses     Orientation RESPIRATION BLADDER Height & Weight     Self, Place  Normal Incontinent, External catheter (Condom catheter) Weight: 85.8 kg (189 lb 2.5 oz) Height:  5\' 9"  (175.3 cm)  BEHAVIORAL SYMPTOMS/MOOD NEUROLOGICAL BOWEL NUTRITION STATUS      Continent Diet (Please see DC Summary)  AMBULATORY STATUS COMMUNICATION  OF NEEDS Skin   Extensive Assist Verbally Normal                       Personal Care Assistance Level of Assistance  Bathing, Feeding, Dressing Bathing Assistance: Maximum assistance Feeding assistance: Limited assistance Dressing Assistance: Maximum assistance     Functional Limitations Info             SPECIAL CARE FACTORS FREQUENCY  PT (By licensed PT)     PT Frequency: 5x/week              Contractures Contractures Info: Not present    Additional Factors Info  Code Status, Allergies Code Status Info: Full Allergies Info: Tomato           Current Medications (09/11/2016):  This is the current hospital active medication list Current Facility-Administered Medications  Medication Dose Route Frequency Provider Last Rate Last Dose  . acetaminophen (TYLENOL) tablet 650 mg  650 mg Oral Q6H PRN Vianne Bulls, MD   650 mg at 09/10/16 2123   Or  . acetaminophen (TYLENOL) suppository 650 mg  650 mg Rectal Q6H PRN Vianne Bulls, MD      . amLODipine (NORVASC) tablet 5 mg  5 mg Oral Daily Robbie Lis, MD   5 mg at 09/11/16 1028  . bisacodyl (DULCOLAX) suppository 10 mg  10 mg Rectal Daily PRN Vianne Bulls, MD      . docusate sodium (COLACE) capsule 100 mg  100 mg Oral BID PRN Ilene Qua Opyd,  MD      . enoxaparin (LOVENOX) injection 40 mg  40 mg Subcutaneous Q24H Vianne Bulls, MD   40 mg at 09/11/16 1028  . hydrALAZINE (APRESOLINE) injection 10 mg  10 mg Intravenous Q4H PRN Ilene Qua Opyd, MD      . ipratropium-albuterol (DUONEB) 0.5-2.5 (3) MG/3ML nebulizer solution 3 mL  3 mL Nebulization Q4H PRN Vianne Bulls, MD   3 mL at 09/10/16 1821  . meloxicam (MOBIC) tablet 7.5 mg  7.5 mg Oral Daily Ilene Qua Opyd, MD   7.5 mg at 09/11/16 1028  . ondansetron (ZOFRAN) tablet 4 mg  4 mg Oral Q6H PRN Vianne Bulls, MD       Or  . ondansetron (ZOFRAN) injection 4 mg  4 mg Intravenous Q6H PRN Vianne Bulls, MD      . pantoprazole (PROTONIX) EC tablet 40 mg  40 mg Oral  BID Vianne Bulls, MD   40 mg at 09/11/16 1028     Discharge Medications: Please see discharge summary for a list of discharge medications.  Relevant Imaging Results:  Relevant Lab Results:   Additional Information SS# [id number]  Benard Halsted, LCSWA

## 2016-09-11 NOTE — Progress Notes (Addendum)
Update: CSW spoke with patient. Patient stated he had been to [facility] since it is across the street from where he lives. He stated that he does not want to pay for a copay at snf and "figures that he will just go home". Patient reported going to the [place] for his medical appointments. CSW will see if he has a case Insurance underwriter.   CSW spoke with patient's daughter. Patient is in his copay days for snf and would need to pay $154 per day. Daughter states that he will have to decide on that because she does not have control of his money. CSW will speak with patient.   Evan Charles LCSWA [phone]

## 2016-09-12 DIAGNOSIS — F0392 Unspecified dementia, unspecified severity, with psychotic disturbance: Secondary | ICD-10-CM | POA: Diagnosis present

## 2016-09-12 DIAGNOSIS — R531 Weakness: Secondary | ICD-10-CM

## 2016-09-12 DIAGNOSIS — I10 Essential (primary) hypertension: Secondary | ICD-10-CM

## 2016-09-12 DIAGNOSIS — Z808 Family history of malignant neoplasm of other organs or systems: Secondary | ICD-10-CM

## 2016-09-12 DIAGNOSIS — T796XXS Traumatic ischemia of muscle, sequela: Secondary | ICD-10-CM

## 2016-09-12 DIAGNOSIS — D649 Anemia, unspecified: Secondary | ICD-10-CM

## 2016-09-12 DIAGNOSIS — Z87891 Personal history of nicotine dependence: Secondary | ICD-10-CM

## 2016-09-12 DIAGNOSIS — Z79899 Other long term (current) drug therapy: Secondary | ICD-10-CM

## 2016-09-12 DIAGNOSIS — R600 Localized edema: Secondary | ICD-10-CM

## 2016-09-12 DIAGNOSIS — F0391 Unspecified dementia with behavioral disturbance: Secondary | ICD-10-CM | POA: Diagnosis present

## 2016-09-12 DIAGNOSIS — F0390 Unspecified dementia without behavioral disturbance: Secondary | ICD-10-CM

## 2016-09-12 LAB — CK: CK TOTAL: 555 U/L — AB (ref 49–397)

## 2016-09-12 MED ORDER — VITAMIN B-12 1000 MCG PO TABS
1000.0000 ug | ORAL_TABLET | Freq: Every day | ORAL | Status: DC
  Administered 2016-09-12 – 2016-09-13 (×2): 1000 ug via ORAL
  Filled 2016-09-12 (×2): qty 1

## 2016-09-12 MED ORDER — POLYSACCHARIDE IRON COMPLEX 150 MG PO CAPS
150.0000 mg | ORAL_CAPSULE | Freq: Every day | ORAL | Status: DC
  Administered 2016-09-12 – 2016-09-13 (×2): 150 mg via ORAL
  Filled 2016-09-12 (×2): qty 1

## 2016-09-12 MED ORDER — QUETIAPINE FUMARATE 50 MG PO TABS
25.0000 mg | ORAL_TABLET | Freq: Every day | ORAL | Status: DC
  Administered 2016-09-12: 25 mg via ORAL
  Filled 2016-09-12: qty 1

## 2016-09-12 MED ORDER — PNEUMOCOCCAL VAC POLYVALENT 25 MCG/0.5ML IJ INJ
0.5000 mL | INJECTION | INTRAMUSCULAR | Status: DC
  Filled 2016-09-12: qty 0.5

## 2016-09-12 NOTE — Progress Notes (Signed)
Physical Therapy Treatment Patient Details Name: Evan Charles MRN: [id number] DOB: June 30, 1946 Today's Date: 09/12/2016    History of Present Illness Pt is a 70 y/o male admitted after falling at home. PMH including but not limited to chronic mental illness (unspecified), chornic back pain and HTN.    PT Comments    Pt was able to ambulate and transfer with mod assist x1 and has overall improved in function since previous session. Pt continues to demonstrate confused affect during session, and requires frequent redirection to task. Continue to recommend SNF as pt does not have 24 hr care.    Follow Up Recommendations  SNF;Supervision/Assistance - 24 hour;Other (comment) (if refuses SNF, recommend HHPT/HHOT/HHAide)     Equipment Recommendations  Rolling walker with 5" wheels;3in1 (PT)    Recommendations for Other Services       Precautions / Restrictions Precautions Precautions: None Restrictions Weight Bearing Restrictions: No    Mobility  Bed Mobility                  Transfers Overall transfer level: Needs assistance   Transfers: Sit to/from Stand Sit to Stand: Mod assist         General transfer comment: variable- required min to mod assist for balance with sit<>stand  Ambulation/Gait Ambulation/Gait assistance: Mod assist Ambulation Distance (Feet): 60 Feet Assistive device: Rolling walker (2 wheeled) Gait Pattern/deviations: Step-to pattern;Narrow base of support;Shuffle     General Gait Details: Pt has posterior and L lateral lean that worsens with fatigue; pt ambulates on lateral aspect of feet Pt tends to need cues to stay close to walker, especially with turns.   Stairs            Wheelchair Mobility    Modified Rankin (Stroke Patients Only)       Balance Overall balance assessment: Needs assistance Sitting-balance support: Bilateral upper extremity supported Sitting balance-Leahy Scale: Fair   Postural control: Posterior  lean Standing balance support: Bilateral upper extremity supported;During functional activity Standing balance-Leahy Scale: Poor Standing balance comment: Pt required tactile cues to correct posterior lateral lean                    Cognition Arousal/Alertness: Awake/alert Behavior During Therapy: WFL for tasks assessed/performed Overall Cognitive Status: History of cognitive impairments - at baseline Area of Impairment: Orientation;Following commands;Awareness Orientation Level: Situation   Memory: Decreased short-term memory Following Commands: Follows one step commands inconsistently   Awareness: Emergent Problem Solving: Difficulty sequencing;Requires verbal cues General Comments: pt confused at times during session    Exercises General Exercises - Lower Extremity Ankle Circles/Pumps: AROM;Both;10 reps;Seated Short Arc Quad: AROM;Right;Left;10 reps;Seated Hip Flexion/Marching: AROM;10 reps;Seated;Both Other Exercises Other Exercises: Core stability exercise x10 reps in sitting Other Exercises: exercises took increased time because of pt difficulty following directions    General Comments General comments (skin integrity, edema, etc.): Pt was found incontinent in chair; depends were donned during PT session and doffed at end of session      Pertinent Vitals/Pain Pain Assessment: 0-10 Pain Location: back Pain Descriptors / Indicators: Aching;Constant Pain Intervention(s): Repositioned;Monitored during session    Home Living                      Prior Function            PT Goals (current goals can now be found in the care plan section) Acute Rehab PT Goals Patient Stated Goal: none stated Progress towards PT goals: Progressing  toward goals    Frequency    Min 3X/week      PT Plan Current plan remains appropriate    Co-evaluation             End of Session Equipment Utilized During Treatment: Gait belt Activity Tolerance: Patient  tolerated treatment well Patient left: in chair;with call bell/phone within reach;with chair alarm set     Time: 1415-1449 PT Time Calculation (min) (ACUTE ONLY): 34 min  Charges:  $Gait Training: 8-22 mins $Therapeutic Exercise: 8-22 mins                    G Codes:      Tonia Brooms 10-03-16, 3:40 PM  Tonia Brooms, SPT [phone]

## 2016-09-12 NOTE — Progress Notes (Signed)
Paged MD Dyann Kief about patient still needing psych consult. He said he called psych and they are coming to see him. Also notified him of patient requesting pain medicine for back. He said he would review home meds and make decision after.

## 2016-09-12 NOTE — Consult Note (Signed)
Evan Charles   Reason for Charles:  Charles for capacity to make medical decisions Referring Physician:  Barton Dubois, MD [facility]) Patient Identification: Evan Charles MRN:  [id number] Principal Diagnosis: Dementia (with acute encephalopathy improving) Diagnosis:   Patient Active Problem List   Diagnosis Date Noted  . Acute encephalopathy [G93.40] 09/08/2016  . Rhabdomyolysis [M62.82] 09/08/2016  . Normocytic anemia [D64.9] 09/08/2016  . Altered mental status [R41.82] 09/08/2016  . Fall at home [W19.XXXA, Y92.099]   . UTI (lower urinary tract infection) [N39.0] 07/25/2016  . Falls [phone] 07/25/2016  . Mild cognitive impairment [G31.84] 07/25/2016  . 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 Immobility [Z74.09] 03/20/2016  . Generalized weakness [R53.1] 03/20/2016  . Failure to thrive in adult [R62.7] 03/20/2016  . Neuropathy ([place]) [G62.9]   . Essential hypertension [I10]   . Physical deconditioning [R53.81]   . 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 GERD without esophagitis [K21.9] 12/19/2015  . Bilateral lower extremity edema [R60.0] 12/19/2015  . Intractable low back pain [M54.5] 02/14/2015  . Difficulty walking [R26.2] 02/14/2015  . Prostate cancer ([place]) [C61] 02/14/2015  . H/O: upper GI bleed [Z87.19]   . Chronic back pain [M54.9, G89.29]   . Visual hallucinations [R44.1]   . Psychoses [F29]     Total Time spent with patient: 40 minutes  Subjective:   Evan Charles is a 70 y.o. male patient admitted with reports of being found nude and in his urine at home. Pt seen and chart reviewed. Pt is alert/oriented to self and place, disoriented to year, month, date, and full situation. Disoriented somewhat to family members also. He presents as calm, cooperative, and appropriate. Pt denies suicidal/homicidal ideation and does not appear to be responding to internal stimuli. However, pt does endorse mild psychosis  with "voices that come into my head sometimes and tell me what to do but I don't know who they are." Pt reports that this has been happening "for awhile now." Pt also had trouble describing his family members and was able to recall their names with great difficulty. When asked to explain his medications, he could not name his medications or why he was taking them. When asked to discuss the risks, benefits, outcomes, and alternatives for SNF rehab placement (short-term plan), he was unable to have this discussion due to lack of ability to conceptualize these scenarios and outcomes. Pt does not possess the mental ability to make informed medical treatment decisions or placement decisions at this time.   HPI:  This has been adapted from hospitalist notes:  "Evan Charles is a 70 y.o. male with medical history significant for GERD, hypertension, unspecified mental illness, and chronic back pain who presents to the emergency department with acute encephalopathy after being found down at home. Patient reportedly lives alone and his daughter had been unable to reach him by phone for several days and requested that EMS perform a welfare check. Patient was found on the floor of his home, naked, and incontinent. He was alert, but not oriented. Patient is unable to contribute much at all to the history due to his acute encephalopathy. History is therefore obtained through discussion with the ED personnel, the patient's daughter, Lelon Frohlich review of the EMR. Patient has been admitted with acute encephalopathy in the past, most recently in September this year, when he had a UTI as well. He was discharged to a skilled nursing facility at that time but has since returned home where  he lives alone. His daughter reports that he is unable to manage his medications on his own and is unable to care for himself even when he is in his best state. Patient was able to bear weight and ambulate short distances with intensive assistance, but  is too weak to stand on his own for more than several seconds. Patient does not voice any specific complaints and reports feeling well."  Pt seen today by psychiatry on 09/12/16. Pt has been cooperative with staff but did have acute encephalopathy upon arrival at the hospital. This has improved greatly and pt did not present as delirious or confused during the evaluation. His presentation is more consistent with mild dementia rather than encephalopathy or delirium at this time.   Past Psychiatric History: mild depression  Risk to Self: Is patient at risk for suicide?: No Risk to Others:   Prior Inpatient Therapy:   Prior Outpatient Therapy:    Past Medical History:  Past Medical History:  Diagnosis Date  . Back pain   . Chronic mental illness   . Colitis   . GERD (gastroesophageal reflux disease)   . Hypertension   . Neuropathy ([place])    " MY HANDS "  . Prostate cancer [facility])     Past Surgical History:  Procedure Laterality Date  . KNEE ARTHROSCOPY    . LAMINECTOMY    . PROSTATECTOMY     Family History:  Family History  Problem Relation Age of Onset  . Cancer Brother   . Cancer Maternal Grandmother    Family Psychiatric  History: pt cannot recall Social History:  History  Alcohol Use No     History  Drug Use No    Social History   Social History  . Marital status: Single    Spouse name: N/A  . Number of children: N/A  . Years of education: N/A   Occupational History  . Retired    Social History Main Topics  . Smoking status: Former Smoker    Types: Cigarettes  . Smokeless tobacco: Never Used  . Alcohol use No  . Drug use: No  . Sexual activity: Not Asked   Other Topics Concern  . None   Social History Narrative  . None   Additional Social History:    Allergies:   Allergies  Allergen Reactions  . Tomato Itching and Rash    Labs:  Results for orders placed or performed during the hospital encounter of 09/07/16 (from the past 48 hour(s))  CK      Status: Abnormal   Collection Time: 09/11/16  5:43 AM  Result Value Ref Range   Total CK 850 (H) 49 - 397 U/L  CK     Status: Abnormal   Collection Time: 09/12/16  5:24 AM  Result Value Ref Range   Total CK 555 (H) 49 - 397 U/L    Current Facility-Administered Medications  Medication Dose Route Frequency Provider Last Rate Last Dose  . acetaminophen (TYLENOL) tablet 650 mg  650 mg Oral Q6H PRN Vianne Bulls, MD   650 mg at 09/10/16 2123   Or  . acetaminophen (TYLENOL) suppository 650 mg  650 mg Rectal Q6H PRN Vianne Bulls, MD      . amLODipine (NORVASC) tablet 5 mg  5 mg Oral Daily Robbie Lis, MD   5 mg at 09/12/16 0959  . bisacodyl (DULCOLAX) suppository 10 mg  10 mg Rectal Daily PRN Vianne Bulls, MD      .  docusate sodium (COLACE) capsule 100 mg  100 mg Oral BID PRN Ilene Qua Opyd, MD      . enoxaparin (LOVENOX) injection 40 mg  40 mg Subcutaneous Q24H Vianne Bulls, MD   40 mg at 09/12/16 0958  . hydrALAZINE (APRESOLINE) injection 10 mg  10 mg Intravenous Q4H PRN Ilene Qua Opyd, MD      . ipratropium-albuterol (DUONEB) 0.5-2.5 (3) MG/3ML nebulizer solution 3 mL  3 mL Nebulization Q4H PRN Vianne Bulls, MD   3 mL at 09/10/16 1821  . iron polysaccharides (NIFEREX) capsule 150 mg  150 mg Oral Daily Barton Dubois, MD   150 mg at 09/12/16 0959  . meloxicam (MOBIC) tablet 7.5 mg  7.5 mg Oral Daily Vianne Bulls, MD   7.5 mg at 09/12/16 0959  . ondansetron (ZOFRAN) tablet 4 mg  4 mg Oral Q6H PRN Vianne Bulls, MD       Or  . ondansetron (ZOFRAN) injection 4 mg  4 mg Intravenous Q6H PRN Vianne Bulls, MD      . pantoprazole (PROTONIX) EC tablet 40 mg  40 mg Oral BID Vianne Bulls, MD   40 mg at 09/12/16 0959  . [START ON 09/13/2016] pneumococcal 23 valent vaccine (PNU-IMMUNE) injection 0.5 mL  0.5 mL Intramuscular Tomorrow-1000 Barton Dubois, MD      . vitamin B-12 (CYANOCOBALAMIN) tablet 1,000 mcg  1,000 mcg Oral Daily Barton Dubois, MD   1,000 mcg at 09/12/16 [id number]     Musculoskeletal: Strength & Muscle Tone: decreased Gait & Station: unsteady Patient leans: N/A  Psychiatric Specialty Exam: Physical Exam  Review of Systems  Psychiatric/Behavioral: Positive for hallucinations. The patient is nervous/anxious.   All other systems reviewed and are negative.   Blood pressure (!) 147/64, pulse 72, temperature 98.2 F (36.8 C), temperature source Oral, resp. rate 18, height 5\' 9"  (1.753 m), weight 86.3 kg (190 lb 4.1 oz), SpO2 99 %.Body mass index is 28.1 kg/m.  General Appearance: Casual and Fairly Groomed  Eye Contact:  Good  Speech:  Clear and Coherent  Volume:  Normal  Mood:  Anxious about his concerns with rehab  Affect:  Appropriate and Congruent  Thought Process:  Coherent, Goal Directed, Linear and Descriptions of Associations: Loose  Orientation:  Other:  Self, place, but disoriented to date, time, situation, and treatment plan  Thought Content:  Concerns over rehab placement. He reports that "they were mean to me last time, those rehab people hurt me and push me too hard.". Pt unable to have discussion of risk/benefit ratio for rehab placement. Pt endorses intermittent "voices telling me what to do and how to do it."  Suicidal Thoughts:  No  Homicidal Thoughts:  No  Memory:  Immediate;   Fair Recent;   Poor Remote;   Poor  Judgement:  Impaired  Insight:  Lacking  Psychomotor Activity:  Decreased  Concentration:  Concentration: Fair and Attention Span: Fair  Recall:  Poor  Fund of Knowledge:  Fair  Language:  Good  Akathisia:  No  Handed:    AIMS (if indicated):     Assets:  Communication Skills Desire for Improvement Resilience Social Support  ADL's:  Impaired  Cognition:  Impaired,  Moderate  Sleep:      Treatment Plan Summary: Dementia with mild psychosis, treated as below:  Medications:  -Seroquel 25mg  po qhs for mild psychotic features and may slowly titrate as long as level of consciousness is not impaired greatly  (feel free  to call Psychiatry for guidance on this).   Disposition: No evidence of imminent risk to self or others at present.   Pt does not possess the mental ability to make informed medical decisions at this time.   Benjamine Mola, [place] 09/12/2016 3:16 PM

## 2016-09-12 NOTE — Progress Notes (Signed)
Patient ID: Evan Charles, male   DOB: 02/01/1946, 70 y.o.   MRN: [id number]  PROGRESS NOTE    Evan Charles  [id number] DOB: 1946-07-15 DOA: 09/07/2016  PCP: PROVIDER NOT IN SYSTEM (follow at the [place])  Brief Narrative:  70 year old male with past medical history of chronic unspecified mental illness, GERD, HTN, chronic back pain who presented to [facility] ED on 09/07/16 with a clinical picture of acute encephalopathy after being found down at home. Pt was found on the floor of his home, naked and incontinent, alert but not oriented. He has been admitted with acute encephalopathy in the past, most recently in September 2017 when he had a UTI. He was discharged to SNF and then home. As per family, he is unable to manage his medications or care for himself even at his baseline best. Significant workup in ED showed CK 1187, CT head and C-spine showed no acute pathology.   Assessment & Plan: Acute metabolic encephalopathy: - Unclear etiology, he does have unspecified chronic mental illness it is just not clear what exactly is his psychiatric history. - CT head and cervical spine without acute findings - EEG showed diffuse slowing, no specific etiology  - Urine microscopy not suggestive of UTI. CMP normal. Ammonia normal. TSH normal. RPR negative - Blood alcohol level negative -UDS positive to prescribed meds only -B12 borderline low normal; will replete  - Per PT evaluation, recommendation for skilled nursing facility placement, appreciate social work assistance with discharge planning -psychiatry has seen patient and found him no capable for medical decisions making. Started on Seroquel to assist with mild psychosis. Also with high concerns for underlying dementia.  Rhabdomyolysis related to trauma - Likely in the setting of being on the floor for unknown period of time - CK levels improved/ essentially resolved with hydration - CK level trending down: 2669 -->2075-->  1245-->  850-->  555  Essential hypertension -stable -will continue current antihypertensive regimen   Bilateral lower extremity edema - Likely nutritional and venous inssuficiency  - Improved after being resting with legs elevated - LE doppler - no DVT  -will use TED hoses to assist with swelling   Adult failure to thrive/falls at home - Per PT - SNF recommended  -found not to have capacity by psychiatry for medical decisions -unfortunately no days left on insurance and per patient description financially unable to pay for copays -will follow assistance from SW for short term rehabilitation at SNF  Normocytic anemia - Ferritin: 11, iron 62 and borderline low normal B12 - Hgb 11.2, stable overall  -will use iron supplementation and B12 supplementation   Abnormal Thyroid FTs -TSH normal but free T4 mildly elevated.  - Clinically euthyroid. - Repeat Thyroid FTs in 4-6 weeks.  -no medication will be initiated currently.   DVT prophylaxis: Lovenox subcutaneous Code Status: full code  Family Communication: No family at the bedside Disposition Plan: To skilled nursing facility once bed available. Found by psychiatry no capable of making medical decisions.    Consultants:   Psychiatry   Procedures:   LE doppler 09/10/2016 - No DVT  Antimicrobials:   None    Subjective: No overnight events. Patient is afebrile and in no distress. Denies CP and SOB. Patient still confused and with impaired insight.  Objective: Vitals:   09/11/16 2148 09/12/16 0501 09/12/16 0958 09/12/16 1333  BP: 106/69 (!) 147/88 122/64 (!) 147/64  Pulse: (!) 104 95  72  Resp: 17 18  18   Temp: 98 F (  36.7 C) 98.2 F (36.8 C)  98.2 F (36.8 C)  TempSrc: Oral Oral  Oral  SpO2: 97% 100%  99%  Weight:  86.3 kg (190 lb 4.1 oz)    Height:        Intake/Output Summary (Last 24 hours) at 09/12/16 1804 Last data filed at 09/12/16 1334  Gross per 24 hour  Intake             1240 ml  Output                0 ml   Net             1240 ml   Filed Weights   09/10/16 0523 09/11/16 0500 09/12/16 0501  Weight: 90.5 kg (199 lb 8.3 oz) 85.8 kg (189 lb 2.5 oz) 86.3 kg (190 lb 4.1 oz)    Examination: General exam: Appears calm and comfortable, in no distress. Patient able to follow commands and answer simple questions appropriately. Still confused and with impaired insight. Flat affect.  Respiratory system: No wheezing, no rhonchi Cardiovascular system: S1 & S2 heard, Rate controlled  Gastrointestinal system: (+) BS, non tender abdomen  Central nervous system: oriented to self only Extremities: Lower extremity edema non pitting and improved, in comparison to description on admission. No LE tenderness  Skin: warm and dry   Data Reviewed: I have personally reviewed following labs and imaging studies  CBC:  Recent Labs Lab 09/07/16 1920 09/08/16 0216 09/09/16 0332  WBC 8.7  --  5.1  NEUTROABS 6.7  --   --   HGB 12.7*  --  11.2*  HCT 38.2* 35.7* 35.9*  MCV 83.4  --  85.5  PLT 340  --  [id number]   Basic Metabolic Panel:  Recent Labs Lab 09/07/16 1920 09/09/16 0332  NA 141 140  K 4.0 3.5  CL 105 108  CO2 26 25  GLUCOSE 90 93  BUN 13 9  CREATININE 0.76 0.85  CALCIUM 9.2 8.4*   GFR: Estimated Creatinine Clearance: 88 mL/min (by C-G formula based on SCr of 0.85 mg/dL).   Liver Function Tests:  Recent Labs Lab 09/07/16 1920 09/09/16 0332  AST 35 49*  ALT 18 20  ALKPHOS 45 40  BILITOT 0.9 0.4  PROT 7.0 5.8*  ALBUMIN 4.0 3.1*    Recent Labs Lab 09/08/16 0216  AMMONIA 28   Cardiac Enzymes:  Recent Labs Lab 09/08/16 0216 09/09/16 0332 09/10/16 0305 09/11/16 0543 09/12/16 0524  CKTOTAL 2,669* 2,075* 1,245* 850* 555*   Urine analysis:    Component Value Date/Time   COLORURINE YELLOW 09/07/2016 1938   APPEARANCEUR CLEAR 09/07/2016 1938   LABSPEC 1.023 09/07/2016 1938   PHURINE 6.0 09/07/2016 1938   GLUCOSEU NEGATIVE 09/07/2016 1938   HGBUR NEGATIVE 09/07/2016 1938    BILIRUBINUR SMALL (A) 09/07/2016 1938   KETONESUR 40 (A) 09/07/2016 1938   PROTEINUR NEGATIVE 09/07/2016 1938   UROBILINOGEN 0.2 02/13/2015 1957   NITRITE NEGATIVE 09/07/2016 1938   LEUKOCYTESUR NEGATIVE 09/07/2016 1938    Radiology Studies: Dg Lumbar Spine Complete Result Date: 09/07/2016 No evidence for lumbar spine fracture. Degenerative disc disease.  Ct Head Wo Contrast Result Date: 09/07/2016 No acute intracranial pathology. No acute/ traumatic cervical spine pathology. Multilevel degenerative changes and disc disease with associated neural foramina and central canal narrowing as described and similar to the prior CT.   Ct Cervical Spine Wo Contrast Result Date: 09/07/2016 No acute intracranial pathology. No acute/ traumatic cervical spine pathology. Multilevel degenerative  changes and disc disease with associated neural foramina and central canal narrowing as described and similar to the prior CT.  Dg Chest Port 1 View Result Date: 09/07/2016 No active disease.    Scheduled Meds: . amLODipine  5 mg Oral Daily  . enoxaparin (LOVENOX) injection  40 mg Subcutaneous Q24H  . iron polysaccharides  150 mg Oral Daily  . meloxicam  7.5 mg Oral Daily  . pantoprazole  40 mg Oral BID  . [START ON 09/13/2016] pneumococcal 23 valent vaccine  0.5 mL Intramuscular Tomorrow-1000  . QUEtiapine  25 mg Oral QHS  . vitamin B-12  1,000 mcg Oral Daily   Continuous Infusions:    LOS: 4 days    Time spent: 25 minutes  Greater than 50% of the time spent on face to face evaluation, coordinating his care and discussing with specialist involved in case.   Barton Dubois, MD [facility] Pager [phone]  If 7PM-7AM, please contact night-coverage [email or web address] Password [email or web address] 09/12/2016, 6:04 PM

## 2016-09-12 NOTE — Care Management Important Message (Signed)
Important Message  Patient Details  Name: Evan Charles MRN: [id number] Date of Birth: [birth year]   Medicare Important Message Given:  Yes    Orbie Pyo 09/12/2016, 10:39 AM

## 2016-09-13 DIAGNOSIS — I10 Essential (primary) hypertension: Secondary | ICD-10-CM | POA: Diagnosis not present

## 2016-09-13 DIAGNOSIS — R488 Other symbolic dysfunctions: Secondary | ICD-10-CM | POA: Diagnosis not present

## 2016-09-13 DIAGNOSIS — L709 Acne, unspecified: Secondary | ICD-10-CM | POA: Diagnosis not present

## 2016-09-13 DIAGNOSIS — G3184 Mild cognitive impairment, so stated: Secondary | ICD-10-CM | POA: Diagnosis not present

## 2016-09-13 DIAGNOSIS — D500 Iron deficiency anemia secondary to blood loss (chronic): Secondary | ICD-10-CM | POA: Diagnosis not present

## 2016-09-13 DIAGNOSIS — K219 Gastro-esophageal reflux disease without esophagitis: Secondary | ICD-10-CM

## 2016-09-13 DIAGNOSIS — J189 Pneumonia, unspecified organism: Secondary | ICD-10-CM | POA: Diagnosis not present

## 2016-09-13 DIAGNOSIS — G9340 Encephalopathy, unspecified: Secondary | ICD-10-CM | POA: Diagnosis not present

## 2016-09-13 DIAGNOSIS — C61 Malignant neoplasm of prostate: Secondary | ICD-10-CM | POA: Diagnosis not present

## 2016-09-13 DIAGNOSIS — F0391 Unspecified dementia with behavioral disturbance: Secondary | ICD-10-CM

## 2016-09-13 DIAGNOSIS — M6281 Muscle weakness (generalized): Secondary | ICD-10-CM | POA: Diagnosis not present

## 2016-09-13 DIAGNOSIS — K922 Gastrointestinal hemorrhage, unspecified: Secondary | ICD-10-CM | POA: Diagnosis not present

## 2016-09-13 DIAGNOSIS — R410 Disorientation, unspecified: Secondary | ICD-10-CM | POA: Diagnosis not present

## 2016-09-13 DIAGNOSIS — M810 Age-related osteoporosis without current pathological fracture: Secondary | ICD-10-CM | POA: Diagnosis not present

## 2016-09-13 DIAGNOSIS — G4700 Insomnia, unspecified: Secondary | ICD-10-CM | POA: Diagnosis not present

## 2016-09-13 DIAGNOSIS — R296 Repeated falls: Secondary | ICD-10-CM | POA: Diagnosis not present

## 2016-09-13 DIAGNOSIS — G9349 Other encephalopathy: Secondary | ICD-10-CM | POA: Diagnosis not present

## 2016-09-13 DIAGNOSIS — R5383 Other fatigue: Secondary | ICD-10-CM | POA: Diagnosis not present

## 2016-09-13 DIAGNOSIS — R262 Difficulty in walking, not elsewhere classified: Secondary | ICD-10-CM | POA: Diagnosis not present

## 2016-09-13 DIAGNOSIS — K5900 Constipation, unspecified: Secondary | ICD-10-CM | POA: Diagnosis not present

## 2016-09-13 DIAGNOSIS — R600 Localized edema: Secondary | ICD-10-CM | POA: Diagnosis not present

## 2016-09-13 DIAGNOSIS — R0989 Other specified symptoms and signs involving the circulatory and respiratory systems: Secondary | ICD-10-CM | POA: Diagnosis not present

## 2016-09-13 DIAGNOSIS — M545 Low back pain: Secondary | ICD-10-CM | POA: Diagnosis not present

## 2016-09-13 DIAGNOSIS — R05 Cough: Secondary | ICD-10-CM | POA: Diagnosis not present

## 2016-09-13 DIAGNOSIS — R5381 Other malaise: Secondary | ICD-10-CM | POA: Diagnosis not present

## 2016-09-13 DIAGNOSIS — K921 Melena: Secondary | ICD-10-CM | POA: Diagnosis not present

## 2016-09-13 DIAGNOSIS — R509 Fever, unspecified: Secondary | ICD-10-CM | POA: Diagnosis not present

## 2016-09-13 MED ORDER — AMLODIPINE BESYLATE 5 MG PO TABS: 5.0000 mg | ORAL_TABLET | Freq: Every day | ORAL | Status: DC

## 2016-09-13 MED ORDER — ACETAMINOPHEN 325 MG PO TABS: 650.0000 mg | ORAL_TABLET | Freq: Four times a day (QID) | ORAL | Status: DC | PRN

## 2016-09-13 MED ORDER — CYANOCOBALAMIN 1000 MCG PO TABS: 1000.0000 ug | ORAL_TABLET | Freq: Every day | ORAL | Status: DC

## 2016-09-13 MED ORDER — POLYSACCHARIDE IRON COMPLEX 150 MG PO CAPS: 150.0000 mg | ORAL_CAPSULE | Freq: Every day | ORAL | Status: DC

## 2016-09-13 MED ORDER — BISACODYL 10 MG RE SUPP: 10.0000 mg | Freq: Every day | RECTAL | Status: DC | PRN

## 2016-09-13 MED ORDER — TRAMADOL HCL 50 MG PO TABS: 50.0000 mg | ORAL_TABLET | Freq: Three times a day (TID) | ORAL | 0 refills | Status: DC | PRN

## 2016-09-13 MED ORDER — QUETIAPINE FUMARATE 25 MG PO TABS: 25.0000 mg | ORAL_TABLET | Freq: Every day | ORAL | Status: DC

## 2016-09-13 NOTE — Progress Notes (Signed)
Report called to [facility] at [employer]. Patient aware of facility transfer. Belongings given to patient. Ready for d/c.

## 2016-09-13 NOTE — NC FL2 (Signed)
[place] LEVEL OF CARE SCREENING TOOL     IDENTIFICATION  Patient Name: Evan Charles Birthdate: Apr 14, 1946 Sex: male Admission Date (Current Location): 09/07/2016  [facility] and [place] Number:  Herbalist and Address:  [facility] [place], [place] [address], [place], [place] [address]      Provider Number: [id number]  Attending Physician Name and Address:  Barton Dubois, MD  Relative Name and Phone Number:  [name] daughter, [phone]    Current Level of Care: Hospital Recommended Level of Care: [place] Prior Approval Number:    Date Approved/Denied:   PASRR Number: [id number]  Discharge Plan: SNF    Current Diagnoses: Patient Active Problem List   Diagnosis Date Noted  . Dementia 09/12/2016  . Acute encephalopathy 09/08/2016  . Rhabdomyolysis 09/08/2016  . Normocytic anemia 09/08/2016  . Altered mental status 09/08/2016  . Fall at home   . UTI (lower urinary tract infection) 07/25/2016  . Falls 07/25/2016  . Mild cognitive impairment 07/25/2016  . 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 Immobility 03/20/2016  . Generalized weakness 03/20/2016  . Failure to thrive in adult 03/20/2016  . Neuropathy ([place])   . Essential hypertension   . Physical deconditioning   . 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 GERD without esophagitis 12/19/2015  . Bilateral lower extremity edema 12/19/2015  . Intractable low back pain 02/14/2015  . Difficulty walking 02/14/2015  . Prostate cancer ([place]) 02/14/2015  . H/O: upper GI bleed   . Chronic back pain   . Visual hallucinations   . Psychoses     Orientation RESPIRATION BLADDER Height & Weight     Self, Place  Normal Incontinent, External catheter (Condom catheter) Weight: 190 lb 4.1 oz (86.3 kg) Height:  5\' 9"  (175.3 cm)  BEHAVIORAL SYMPTOMS/MOOD NEUROLOGICAL BOWEL NUTRITION STATUS      Continent Diet (Please see DC Summary)   AMBULATORY STATUS COMMUNICATION OF NEEDS Skin   Extensive Assist Verbally Normal                       Personal Care Assistance Level of Assistance  Bathing, Feeding, Dressing Bathing Assistance: Maximum assistance Feeding assistance: Limited assistance Dressing Assistance: Maximum assistance     Functional Limitations Info             SPECIAL CARE FACTORS FREQUENCY  PT (By licensed PT)     PT Frequency: 5x/week              Contractures Contractures Info: Not present    Additional Factors Info  Code Status, Allergies Code Status Info: Full Allergies Info: Tomato           Current Medications (09/13/2016):  This is the current hospital active medication list Current Facility-Administered Medications  Medication Dose Route Frequency Provider Last Rate Last Dose  . acetaminophen (TYLENOL) tablet 650 mg  650 mg Oral Q6H PRN Vianne Bulls, MD   650 mg at 09/10/16 2123   Or  . acetaminophen (TYLENOL) suppository 650 mg  650 mg Rectal Q6H PRN Vianne Bulls, MD      . amLODipine (NORVASC) tablet 5 mg  5 mg Oral Daily Robbie Lis, MD   5 mg at 09/13/16 0936  . bisacodyl (DULCOLAX) suppository 10 mg  10 mg Rectal Daily PRN Vianne Bulls, MD      . docusate sodium (COLACE) capsule 100 mg  100 mg Oral BID PRN  Ilene Qua Opyd, MD      . enoxaparin (LOVENOX) injection 40 mg  40 mg Subcutaneous Q24H Vianne Bulls, MD   40 mg at 09/13/16 0935  . hydrALAZINE (APRESOLINE) injection 10 mg  10 mg Intravenous Q4H PRN Ilene Qua Opyd, MD      . ipratropium-albuterol (DUONEB) 0.5-2.5 (3) MG/3ML nebulizer solution 3 mL  3 mL Nebulization Q4H PRN Vianne Bulls, MD   3 mL at 09/10/16 1821  . iron polysaccharides (NIFEREX) capsule 150 mg  150 mg Oral Daily Barton Dubois, MD   150 mg at 09/13/16 0937  . meloxicam (MOBIC) tablet 7.5 mg  7.5 mg Oral Daily Vianne Bulls, MD   7.5 mg at 09/13/16 0936  . ondansetron (ZOFRAN) tablet 4 mg  4 mg Oral Q6H PRN Vianne Bulls, MD       Or   . ondansetron (ZOFRAN) injection 4 mg  4 mg Intravenous Q6H PRN Vianne Bulls, MD      . pantoprazole (PROTONIX) EC tablet 40 mg  40 mg Oral BID Vianne Bulls, MD   40 mg at 09/13/16 0937  . QUEtiapine (SEROQUEL) tablet 25 mg  25 mg Oral QHS Benjamine Mola, FNP   25 mg at 09/12/16 2238  . vitamin B-12 (CYANOCOBALAMIN) tablet 1,000 mcg  1,000 mcg Oral Daily Barton Dubois, MD   1,000 mcg at 09/13/16 [id number]     Discharge Medications: Please see discharge summary for a list of discharge medications.  Relevant Imaging Results:  Relevant Lab Results:   Additional Information PATIENT HAS EXHAUSTED MEDICARE DAYS. ASST CSW DIRECTOR AGREEABLE TO 30-DAY LOG WHILE PATIENT'S DAUGHTER APPLIES FOR MEDICAID  Irva Loser C, LCSW

## 2016-09-13 NOTE — Discharge Summary (Addendum)
Physician Discharge Summary  Evan Charles [id number] DOB: [birth year] DOA: 09/07/2016  PCP: PROVIDER NOT IN SYSTEM  Admit date: 09/07/2016 Discharge date: 09/13/2016  Time spent: 35 minutes  Recommendations for Outpatient Follow-up:  1. Repeat thyroid panel in 4-6 weeks 2. Please reassess BP and adjust antihypertensive regimen 3. Follow up with psychiatry service for further adjustments on medication and treatment of underlying dementia with mild psychosis. 4. Repeat BMET in 5 days to follow electrolytes   Discharge Diagnoses:  Principal Problem:   Dementia Active Problems:   GERD without esophagitis   Bilateral lower extremity edema   Generalized weakness   Essential hypertension   Acute encephalopathy   Rhabdomyolysis   Normocytic anemia   Altered mental status   Discharge Condition: stable and improved. Discharge to SNF for further rehabilitation and care.  Diet recommendation: heart healthy diet   Filed Weights   09/10/16 0523 09/11/16 0500 09/12/16 0501  Weight: 90.5 kg (199 lb 8.3 oz) 85.8 kg (189 lb 2.5 oz) 86.3 kg (190 lb 4.1 oz)    History of present illness:  70 year old male with past medical history of chronic unspecified mental illness, GERD, HTN, chronic back pain who presented to [facility] ED on 09/07/16 with a clinical picture of acute encephalopathy after being found down at home. Pt was found on the floor of his home, naked and incontinent, alert but not oriented. He has been admitted with acute encephalopathy in the past, most recently in September 2017 when he had a UTI. He was discharged to SNF and then home. As per family, he is unable to manage his medications or care for himself even at his baseline best. Significant workup in ED showed CK 1187, CT head and C-spine showed no acute pathology.   Hospital Course:  Acute metabolic encephalopathy: - Unclear etiology, he does have unspecified chronic mental illness and per psych evaluation with dementia  features and mild psychosis. - CT head and cervical spine without acute findings - EEG showed diffuse slowing, no specific etiology  - Urine microscopy not suggestive of UTI. CMP normal. Ammonia normal. TSH normal. RPR negative - Blood alcohol level negative -UDS positive to prescribed meds only -B12 borderline low normal; will discharge on supplementation   - Per PT evaluation, recommendation for skilled nursing facility placement, appreciate social work assistance with discharge planning -psychiatry has seen patient and found him no capable for medical decisions making. Started on Seroquel to assist with mild psychosis. Also with high concerns for underlying dementia. Will need further follow up/work up and medication adjustments as an outpatient.  Rhabdomyolysis related to trauma - Likely in the setting of being on the floor for unknown period of time - CK levels improved/ essentially resolved with hydration - CK level trend: 2669 -->2075-->  1245-->  850--> 555 at discharge -advise to maintain adequate hydration   Essential hypertension -stable now -will continue current antihypertensive regimen (norvasc 5 mg daily) -low sodium/heart healthy diet recommended   Bilateral lower extremity edema -Likely nutritional and venous inssuficiency in nature -Improved after being resting with legs elevated -LE doppler - no DVT appreciated -ok to use TED hoses to assist with swelling (12 hours on and 12 hours off)   Adult failure to thrive/falls at home - Per PT - SNF recommended  -found not to have capacity by psychiatry for medical decisions -unfortunately no days left on insurance and per patient description financially unable to pay for copays; hospital will cover 30 days letter of guarantee and the  patient will be discharge to SNF for rehabilitation. -Appreciate assistance from SW for discharge.  Normocytic anemia -Ferritin: 11, iron 62 and borderline low normal B12 -Hgb 11.2,  stable overall  -will use iron supplementation and B12 supplementation   Abnormal Thyroid FTs -TSH normal but free T4 mildly elevated.  - Clinically euthyroid. - Recommend repeat Thyroid FTs in 4-6 weeks.  -no medication will be initiated currently.  Chronic back pain syndrome -continue Mobic and PRN tramadol  Procedures:  See below for x-ray reports   Consultations:  Psychiatry   Discharge Exam: Vitals:   09/13/16 0651 09/13/16 0935  BP: 136/83 102/60  Pulse: 85   Resp: 16   Temp: 98.6 F (37 C)    General exam: Appears calm and comfortable, in no distress. Patient able to follow commands properly and answer simple questions appropriately. Still intermittently confused and with impaired insight. Flat affect on exam. No Suicidal ideation. Respiratory system: No wheezing, no rhonchi, good air movement and excellent O2 sat on RA Cardiovascular system: S1 & S2 heard, Rate controlled  Gastrointestinal system: (+) BS, non tender abdomen  Central nervous system: oriented to self and place, no focal neurologic or motor deficit observed. Extremities: Lower extremity with trace edema non pitting and improved, in comparison to description on admission. No LE tenderness and no open wounds appreciated.  Skin: warm and dry   Discharge Instructions   Discharge Instructions    Diet - low sodium heart healthy    Complete by:  As directed    Discharge instructions    Complete by:  As directed    Take medications as prescribed  Maintain adequate hydration Please repeat BMET in 5 days to follow electrolytes and renal function  Follow heart healthy diet  Physical rehabilitation as per SNF protocol     Current Discharge Medication List    START taking these medications   Details  acetaminophen (TYLENOL) 325 MG tablet Take 2 tablets (650 mg total) by mouth every 6 (six) hours as needed for mild pain (or Fever >/= 101).    amLODipine (NORVASC) 5 MG tablet Take 1 tablet (5 mg  total) by mouth daily.    iron polysaccharides (NIFEREX) 150 MG capsule Take 1 capsule (150 mg total) by mouth daily.    QUEtiapine (SEROQUEL) 25 MG tablet Take 1 tablet (25 mg total) by mouth at bedtime.    vitamin B-12 1000 MCG tablet Take 1 tablet (1,000 mcg total) by mouth daily.      CONTINUE these medications which have CHANGED   Details  bisacodyl (DULCOLAX) 10 MG suppository Place 1 suppository (10 mg total) rectally daily as needed for moderate constipation.    traMADol (ULTRAM) 50 MG tablet Take 1 tablet (50 mg total) by mouth every 8 (eight) hours as needed for severe pain. Qty: 20 tablet, Refills: 0      CONTINUE these medications which have NOT CHANGED   Details  docusate sodium (COLACE) 100 MG capsule Take 1 capsule (100 mg total) by mouth 2 (two) times daily.    pantoprazole (PROTONIX) 40 MG tablet Take 1 tablet (40 mg total) by mouth 2 (two) times daily. Qty: 60 tablet, Refills: 0    meloxicam (MOBIC) 7.5 MG tablet Take 1 tablet (7.5 mg total) by mouth daily.      STOP taking these medications     traZODone (DESYREL) 50 MG tablet      hydrochlorothiazide (MICROZIDE) 12.5 MG capsule        Allergies  Allergen Reactions  . Tomato Itching and Rash    The results of significant diagnostics from this hospitalization (including imaging, microbiology, ancillary and laboratory) are listed below for reference.    Significant Diagnostic Studies: Dg Lumbar Spine Complete  Result Date: 09/07/2016 CLINICAL DATA:  Fall.  Back pain. EXAM: LUMBAR SPINE - COMPLETE 4+ VIEW COMPARISON:  Lumbar spine MRI 03/21/2016.  X-rays from 03/20/2016. FINDINGS: No evidence of fracture. No subluxation. Loss of disc height at L1-2, diffuse loss of intervertebral disc height the lumbar spine again noted. Spondylolisthesis at L4-5 is stable. SI joints are unremarkable. IMPRESSION: No evidence for lumbar spine fracture. Degenerative disc disease. Electronically Signed   By: Misty Stanley  M.D.   On: 09/07/2016 20:37   Ct Head Wo Contrast  Result Date: 09/07/2016 CLINICAL DATA:  70 year old male with fall. EXAM: CT HEAD WITHOUT CONTRAST CT CERVICAL SPINE WITHOUT CONTRAST TECHNIQUE: Multidetector CT imaging of the head and cervical spine was performed following the standard protocol without intravenous contrast. Multiplanar CT image reconstructions of the cervical spine were also generated. COMPARISON:  Head CT dated 07/25/2016 FINDINGS: CT HEAD FINDINGS Brain: The ventricles and sulci are appropriate in size for patient's age. Minimal periventricular and deep white matter chronic microvascular ischemic changes noted. There is no acute intracranial hemorrhage. No mass effect or midline shift. There is no extra-axial fluid collection. Vascular: No hyperdense vessel or unexpected calcification. Skull: Normal. Negative for fracture or focal lesion. Sinuses/Orbits: No acute finding. Other: None CT CERVICAL SPINE FINDINGS Alignment: No acute subluxation. There is straightening of the normal cervical lordosis similar prior study. Skull base and vertebrae: No acute fracture. There is incomplete bony fusion of the posterior ring of C1. Soft tissues and spinal canal: No prevertebral fluid or swelling. No visible canal hematoma. Disc levels: At C2-C3: Bilateral facet hypertrophy, right greater left with associated neural foramina narrowing on the right. At C3-C4 bilateral facet hypertrophy, left greater right there is associated narrowing of the central canal and bilateral neural foramina, left greater right. At C4-C5 there is bilateral facet hypertrophy with associated narrowing of the neural foramina. There is mild narrowing of the central canal at this level. At C5-C6 there is vertebral fusion. There is facet hypertrophy and associated neural foramina narrowing on the left. At C6-C7 bilateral facet hypertrophy, left greater right with bilateral neural foramina narrowing. There is moderate canal stenosis.  At C7-T1 left facet hypertrophy. The neural foramina and central canal appear patent. Upper chest: The lung apices are clear. Other: None IMPRESSION: No acute intracranial pathology. No acute/ traumatic cervical spine pathology. Multilevel degenerative changes and disc disease with associated neural foramina and central canal narrowing as described and similar to the prior CT. Electronically Signed   By: Anner Crete M.D.   On: 09/07/2016 22:13   Ct Cervical Spine Wo Contrast  Result Date: 09/07/2016 CLINICAL DATA:  70 year old male with fall. EXAM: CT HEAD WITHOUT CONTRAST CT CERVICAL SPINE WITHOUT CONTRAST TECHNIQUE: Multidetector CT imaging of the head and cervical spine was performed following the standard protocol without intravenous contrast. Multiplanar CT image reconstructions of the cervical spine were also generated. COMPARISON:  Head CT dated 07/25/2016 FINDINGS: CT HEAD FINDINGS Brain: The ventricles and sulci are appropriate in size for patient's age. Minimal periventricular and deep white matter chronic microvascular ischemic changes noted. There is no acute intracranial hemorrhage. No mass effect or midline shift. There is no extra-axial fluid collection. Vascular: No hyperdense vessel or unexpected calcification. Skull: Normal. Negative for fracture or  focal lesion. Sinuses/Orbits: No acute finding. Other: None CT CERVICAL SPINE FINDINGS Alignment: No acute subluxation. There is straightening of the normal cervical lordosis similar prior study. Skull base and vertebrae: No acute fracture. There is incomplete bony fusion of the posterior ring of C1. Soft tissues and spinal canal: No prevertebral fluid or swelling. No visible canal hematoma. Disc levels: At C2-C3: Bilateral facet hypertrophy, right greater left with associated neural foramina narrowing on the right. At C3-C4 bilateral facet hypertrophy, left greater right there is associated narrowing of the central canal and bilateral neural  foramina, left greater right. At C4-C5 there is bilateral facet hypertrophy with associated narrowing of the neural foramina. There is mild narrowing of the central canal at this level. At C5-C6 there is vertebral fusion. There is facet hypertrophy and associated neural foramina narrowing on the left. At C6-C7 bilateral facet hypertrophy, left greater right with bilateral neural foramina narrowing. There is moderate canal stenosis. At C7-T1 left facet hypertrophy. The neural foramina and central canal appear patent. Upper chest: The lung apices are clear. Other: None IMPRESSION: No acute intracranial pathology. No acute/ traumatic cervical spine pathology. Multilevel degenerative changes and disc disease with associated neural foramina and central canal narrowing as described and similar to the prior CT. Electronically Signed   By: Anner Crete M.D.   On: 09/07/2016 22:13   Dg Chest Port 1 View  Result Date: 09/07/2016 CLINICAL DATA:  70 year old male with altered mental status EXAM: PORTABLE CHEST 1 VIEW COMPARISON:  Chest radiograph dated 07/25/2016 FINDINGS: There is stable mild eventration of the left hemidiaphragm with associated subsegmental atelectatic changes of the left lung base. The right lung is clear. There is no pleural effusion or pneumothorax. Stable top-normal cardiac silhouette. No acute osseous pathology. IMPRESSION: No active disease. Electronically Signed   By: Anner Crete M.D.   On: 09/07/2016 23:44    Microbiology: No results found for this or any previous visit (from the past 240 hour(s)).   Labs: Basic Metabolic Panel:  Recent Labs Lab 09/07/16 1920 09/09/16 0332  NA 141 140  K 4.0 3.5  CL 105 108  CO2 26 25  GLUCOSE 90 93  BUN 13 9  CREATININE 0.76 0.85  CALCIUM 9.2 8.4*   Liver Function Tests:  Recent Labs Lab 09/07/16 1920 09/09/16 0332  AST 35 49*  ALT 18 20  ALKPHOS 45 40  BILITOT 0.9 0.4  PROT 7.0 5.8*  ALBUMIN 4.0 3.1*    Recent  Labs Lab 09/08/16 0216  AMMONIA 28   CBC:  Recent Labs Lab 09/07/16 1920 09/08/16 0216 09/09/16 0332  WBC 8.7  --  5.1  NEUTROABS 6.7  --   --   HGB 12.7*  --  11.2*  HCT 38.2* 35.7* 35.9*  MCV 83.4  --  85.5  PLT 340  --  287   Cardiac Enzymes:  Recent Labs Lab 09/08/16 0216 09/09/16 0332 09/10/16 0305 09/11/16 0543 09/12/16 0524  CKTOTAL 2,669* 2,075* 1,245* 850* 555*   BNP: BNP (last 3 results)  Recent Labs  12/11/15 1424 09/08/16 0216  BNP 15.4 18.6   Signed:  Barton Dubois MD.  [facility] 09/13/2016, 12:07 PM

## 2016-09-13 NOTE — Clinical Social Work Note (Signed)
Asst CSW Director has approved 30-day Letter of Guarantee (LOG) for SNF placement.  Of note, psychiatry evaluated the patient on 09/12/2016 and states patient lacks capacity.  Patient has been to [facility]) SNF in the past.  CSW contacted liaison to determine if patient will be able to return to [facility] under [place].  Liaison will check with administration.  Patient has been referred to other SNFs as well.  Bed offers pending.  Patient is medically ready to discharge pending SNF bed.  Projected dc: SNF  Nonnie Done, MSW, LCSW  [id number]) [phone]  Licensed Clinical Social Worker

## 2016-09-13 NOTE — Clinical Social Work Note (Signed)
Daughter wishes for patient to return to [facility].  LOG was offered and [facility] is agreeable to accept patient for STR.  Patient's daughter was updated via phone and agreeable to these plans.  Patient will be transferred via [place].  CSW contacted daughter to inform of transfer today.  CSW left message.  Nonnie Done, MSW, LCSW  ([phone]) [phone]  Licensed Clinical Social Worker

## 2016-09-20 DIAGNOSIS — R600 Localized edema: Secondary | ICD-10-CM | POA: Diagnosis not present

## 2016-09-20 DIAGNOSIS — M545 Low back pain: Secondary | ICD-10-CM | POA: Diagnosis not present

## 2016-09-20 DIAGNOSIS — G9349 Other encephalopathy: Secondary | ICD-10-CM | POA: Diagnosis not present

## 2016-09-20 DIAGNOSIS — I10 Essential (primary) hypertension: Secondary | ICD-10-CM | POA: Diagnosis not present

## 2016-09-25 DIAGNOSIS — J189 Pneumonia, unspecified organism: Secondary | ICD-10-CM | POA: Diagnosis not present

## 2016-09-25 DIAGNOSIS — R5383 Other fatigue: Secondary | ICD-10-CM | POA: Diagnosis not present

## 2016-09-25 DIAGNOSIS — R5381 Other malaise: Secondary | ICD-10-CM | POA: Diagnosis not present

## 2016-09-25 DIAGNOSIS — R05 Cough: Secondary | ICD-10-CM | POA: Diagnosis not present

## 2016-09-28 DIAGNOSIS — J189 Pneumonia, unspecified organism: Secondary | ICD-10-CM | POA: Diagnosis not present

## 2016-09-28 DIAGNOSIS — R05 Cough: Secondary | ICD-10-CM | POA: Diagnosis not present

## 2016-10-06 DIAGNOSIS — M810 Age-related osteoporosis without current pathological fracture: Secondary | ICD-10-CM | POA: Diagnosis not present

## 2016-10-06 DIAGNOSIS — D649 Anemia, unspecified: Secondary | ICD-10-CM | POA: Diagnosis not present

## 2016-10-06 DIAGNOSIS — M545 Low back pain: Secondary | ICD-10-CM | POA: Diagnosis not present

## 2016-10-06 DIAGNOSIS — R600 Localized edema: Secondary | ICD-10-CM | POA: Diagnosis not present

## 2016-10-06 DIAGNOSIS — I10 Essential (primary) hypertension: Secondary | ICD-10-CM | POA: Diagnosis not present

## 2016-10-06 DIAGNOSIS — M47892 Other spondylosis, cervical region: Secondary | ICD-10-CM | POA: Diagnosis not present

## 2016-10-06 DIAGNOSIS — Z9181 History of falling: Secondary | ICD-10-CM | POA: Diagnosis not present

## 2016-10-06 DIAGNOSIS — G8929 Other chronic pain: Secondary | ICD-10-CM | POA: Diagnosis not present

## 2016-10-06 DIAGNOSIS — J189 Pneumonia, unspecified organism: Secondary | ICD-10-CM | POA: Diagnosis not present

## 2016-10-06 DIAGNOSIS — M6282 Rhabdomyolysis: Secondary | ICD-10-CM | POA: Diagnosis not present

## 2016-10-06 DIAGNOSIS — K219 Gastro-esophageal reflux disease without esophagitis: Secondary | ICD-10-CM | POA: Diagnosis not present

## 2016-10-06 DIAGNOSIS — M4802 Spinal stenosis, cervical region: Secondary | ICD-10-CM | POA: Diagnosis not present

## 2016-10-10 DIAGNOSIS — I10 Essential (primary) hypertension: Secondary | ICD-10-CM | POA: Diagnosis not present

## 2016-10-10 DIAGNOSIS — M810 Age-related osteoporosis without current pathological fracture: Secondary | ICD-10-CM | POA: Diagnosis not present

## 2016-10-10 DIAGNOSIS — K219 Gastro-esophageal reflux disease without esophagitis: Secondary | ICD-10-CM | POA: Diagnosis not present

## 2016-10-10 DIAGNOSIS — M6282 Rhabdomyolysis: Secondary | ICD-10-CM | POA: Diagnosis not present

## 2016-10-10 DIAGNOSIS — M545 Low back pain: Secondary | ICD-10-CM | POA: Diagnosis not present

## 2016-10-10 DIAGNOSIS — J189 Pneumonia, unspecified organism: Secondary | ICD-10-CM | POA: Diagnosis not present

## 2016-10-10 DIAGNOSIS — G8929 Other chronic pain: Secondary | ICD-10-CM | POA: Diagnosis not present

## 2016-10-10 DIAGNOSIS — R600 Localized edema: Secondary | ICD-10-CM | POA: Diagnosis not present

## 2016-10-10 DIAGNOSIS — Z9181 History of falling: Secondary | ICD-10-CM | POA: Diagnosis not present

## 2016-10-10 DIAGNOSIS — M47892 Other spondylosis, cervical region: Secondary | ICD-10-CM | POA: Diagnosis not present

## 2016-10-10 DIAGNOSIS — D649 Anemia, unspecified: Secondary | ICD-10-CM | POA: Diagnosis not present

## 2016-10-10 DIAGNOSIS — M4802 Spinal stenosis, cervical region: Secondary | ICD-10-CM | POA: Diagnosis not present

## 2016-10-12 DIAGNOSIS — M6282 Rhabdomyolysis: Secondary | ICD-10-CM | POA: Diagnosis not present

## 2016-10-12 DIAGNOSIS — G8929 Other chronic pain: Secondary | ICD-10-CM | POA: Diagnosis not present

## 2016-10-12 DIAGNOSIS — M545 Low back pain: Secondary | ICD-10-CM | POA: Diagnosis not present

## 2016-10-12 DIAGNOSIS — Z9181 History of falling: Secondary | ICD-10-CM | POA: Diagnosis not present

## 2016-10-12 DIAGNOSIS — J189 Pneumonia, unspecified organism: Secondary | ICD-10-CM | POA: Diagnosis not present

## 2016-10-12 DIAGNOSIS — M47892 Other spondylosis, cervical region: Secondary | ICD-10-CM | POA: Diagnosis not present

## 2016-10-12 DIAGNOSIS — R600 Localized edema: Secondary | ICD-10-CM | POA: Diagnosis not present

## 2016-10-12 DIAGNOSIS — D649 Anemia, unspecified: Secondary | ICD-10-CM | POA: Diagnosis not present

## 2016-10-12 DIAGNOSIS — I10 Essential (primary) hypertension: Secondary | ICD-10-CM | POA: Diagnosis not present

## 2016-10-12 DIAGNOSIS — K219 Gastro-esophageal reflux disease without esophagitis: Secondary | ICD-10-CM | POA: Diagnosis not present

## 2016-10-12 DIAGNOSIS — M4802 Spinal stenosis, cervical region: Secondary | ICD-10-CM | POA: Diagnosis not present

## 2016-10-12 DIAGNOSIS — M810 Age-related osteoporosis without current pathological fracture: Secondary | ICD-10-CM | POA: Diagnosis not present

## 2016-10-16 DIAGNOSIS — G8929 Other chronic pain: Secondary | ICD-10-CM | POA: Diagnosis not present

## 2016-10-16 DIAGNOSIS — R600 Localized edema: Secondary | ICD-10-CM | POA: Diagnosis not present

## 2016-10-16 DIAGNOSIS — D649 Anemia, unspecified: Secondary | ICD-10-CM | POA: Diagnosis not present

## 2016-10-16 DIAGNOSIS — M47892 Other spondylosis, cervical region: Secondary | ICD-10-CM | POA: Diagnosis not present

## 2016-10-16 DIAGNOSIS — M810 Age-related osteoporosis without current pathological fracture: Secondary | ICD-10-CM | POA: Diagnosis not present

## 2016-10-16 DIAGNOSIS — M4802 Spinal stenosis, cervical region: Secondary | ICD-10-CM | POA: Diagnosis not present

## 2016-10-16 DIAGNOSIS — I10 Essential (primary) hypertension: Secondary | ICD-10-CM | POA: Diagnosis not present

## 2016-10-16 DIAGNOSIS — M545 Low back pain: Secondary | ICD-10-CM | POA: Diagnosis not present

## 2016-10-16 DIAGNOSIS — M6282 Rhabdomyolysis: Secondary | ICD-10-CM | POA: Diagnosis not present

## 2016-10-16 DIAGNOSIS — K219 Gastro-esophageal reflux disease without esophagitis: Secondary | ICD-10-CM | POA: Diagnosis not present

## 2016-10-16 DIAGNOSIS — Z9181 History of falling: Secondary | ICD-10-CM | POA: Diagnosis not present

## 2016-10-16 DIAGNOSIS — J189 Pneumonia, unspecified organism: Secondary | ICD-10-CM | POA: Diagnosis not present

## 2016-10-17 DIAGNOSIS — R600 Localized edema: Secondary | ICD-10-CM | POA: Diagnosis not present

## 2016-10-17 DIAGNOSIS — J189 Pneumonia, unspecified organism: Secondary | ICD-10-CM | POA: Diagnosis not present

## 2016-10-17 DIAGNOSIS — I10 Essential (primary) hypertension: Secondary | ICD-10-CM | POA: Diagnosis not present

## 2016-10-17 DIAGNOSIS — K219 Gastro-esophageal reflux disease without esophagitis: Secondary | ICD-10-CM | POA: Diagnosis not present

## 2016-10-17 DIAGNOSIS — M545 Low back pain: Secondary | ICD-10-CM | POA: Diagnosis not present

## 2016-10-17 DIAGNOSIS — Z9181 History of falling: Secondary | ICD-10-CM | POA: Diagnosis not present

## 2016-10-17 DIAGNOSIS — M6282 Rhabdomyolysis: Secondary | ICD-10-CM | POA: Diagnosis not present

## 2016-10-17 DIAGNOSIS — M810 Age-related osteoporosis without current pathological fracture: Secondary | ICD-10-CM | POA: Diagnosis not present

## 2016-10-17 DIAGNOSIS — M47892 Other spondylosis, cervical region: Secondary | ICD-10-CM | POA: Diagnosis not present

## 2016-10-17 DIAGNOSIS — D649 Anemia, unspecified: Secondary | ICD-10-CM | POA: Diagnosis not present

## 2016-10-17 DIAGNOSIS — M4802 Spinal stenosis, cervical region: Secondary | ICD-10-CM | POA: Diagnosis not present

## 2016-10-17 DIAGNOSIS — G8929 Other chronic pain: Secondary | ICD-10-CM | POA: Diagnosis not present

## 2016-10-23 ENCOUNTER — Encounter (HOSPITAL_COMMUNITY): Payer: Self-pay | Admitting: *Deleted

## 2016-10-23 ENCOUNTER — Emergency Department (HOSPITAL_COMMUNITY): Payer: Medicare Other

## 2016-10-23 ENCOUNTER — Emergency Department (HOSPITAL_COMMUNITY)
Admission: EM | Admit: 2016-10-23 | Discharge: 2016-10-23 | Disposition: A | Discharge status: Home / Self Care | Payer: Medicare Other | Attending: Emergency Medicine | Admitting: Emergency Medicine

## 2016-10-23 DIAGNOSIS — Y929 Unspecified place or not applicable: Secondary | ICD-10-CM | POA: Diagnosis not present

## 2016-10-23 DIAGNOSIS — R600 Localized edema: Secondary | ICD-10-CM | POA: Diagnosis not present

## 2016-10-23 DIAGNOSIS — S20219A Contusion of unspecified front wall of thorax, initial encounter: Secondary | ICD-10-CM | POA: Insufficient documentation

## 2016-10-23 DIAGNOSIS — W19XXXA Unspecified fall, initial encounter: Secondary | ICD-10-CM | POA: Insufficient documentation

## 2016-10-23 DIAGNOSIS — M545 Low back pain: Secondary | ICD-10-CM | POA: Diagnosis not present

## 2016-10-23 DIAGNOSIS — Z79899 Other long term (current) drug therapy: Secondary | ICD-10-CM | POA: Insufficient documentation

## 2016-10-23 DIAGNOSIS — S6992XA Unspecified injury of left wrist, hand and finger(s), initial encounter: Secondary | ICD-10-CM | POA: Diagnosis not present

## 2016-10-23 DIAGNOSIS — S66912A Strain of unspecified muscle, fascia and tendon at wrist and hand level, left hand, initial encounter: Secondary | ICD-10-CM | POA: Insufficient documentation

## 2016-10-23 DIAGNOSIS — Z87891 Personal history of nicotine dependence: Secondary | ICD-10-CM | POA: Insufficient documentation

## 2016-10-23 DIAGNOSIS — D649 Anemia, unspecified: Secondary | ICD-10-CM | POA: Diagnosis not present

## 2016-10-23 DIAGNOSIS — M810 Age-related osteoporosis without current pathological fracture: Secondary | ICD-10-CM | POA: Diagnosis not present

## 2016-10-23 DIAGNOSIS — K219 Gastro-esophageal reflux disease without esophagitis: Secondary | ICD-10-CM | POA: Diagnosis not present

## 2016-10-23 DIAGNOSIS — Y999 Unspecified external cause status: Secondary | ICD-10-CM | POA: Insufficient documentation

## 2016-10-23 DIAGNOSIS — I10 Essential (primary) hypertension: Secondary | ICD-10-CM | POA: Insufficient documentation

## 2016-10-23 DIAGNOSIS — G8929 Other chronic pain: Secondary | ICD-10-CM | POA: Diagnosis not present

## 2016-10-23 DIAGNOSIS — Z9181 History of falling: Secondary | ICD-10-CM | POA: Diagnosis not present

## 2016-10-23 DIAGNOSIS — Y939 Activity, unspecified: Secondary | ICD-10-CM | POA: Insufficient documentation

## 2016-10-23 DIAGNOSIS — S20212A Contusion of left front wall of thorax, initial encounter: Secondary | ICD-10-CM

## 2016-10-23 DIAGNOSIS — Z8546 Personal history of malignant neoplasm of prostate: Secondary | ICD-10-CM | POA: Insufficient documentation

## 2016-10-23 DIAGNOSIS — M4802 Spinal stenosis, cervical region: Secondary | ICD-10-CM | POA: Diagnosis not present

## 2016-10-23 DIAGNOSIS — R51 Headache: Secondary | ICD-10-CM | POA: Insufficient documentation

## 2016-10-23 DIAGNOSIS — M6282 Rhabdomyolysis: Secondary | ICD-10-CM | POA: Diagnosis not present

## 2016-10-23 DIAGNOSIS — M47892 Other spondylosis, cervical region: Secondary | ICD-10-CM | POA: Diagnosis not present

## 2016-10-23 DIAGNOSIS — R079 Chest pain, unspecified: Secondary | ICD-10-CM | POA: Diagnosis not present

## 2016-10-23 DIAGNOSIS — J189 Pneumonia, unspecified organism: Secondary | ICD-10-CM | POA: Diagnosis not present

## 2016-10-23 LAB — I-STAT TROPONIN, ED: TROPONIN I, POC: 0 ng/mL (ref 0.00–0.08)

## 2016-10-23 LAB — CBC
HCT: 39.4 % (ref 39.0–52.0)
HEMOGLOBIN: 12.8 g/dL — AB (ref 13.0–17.0)
MCH: 27.4 pg (ref 26.0–34.0)
MCHC: 32.5 g/dL (ref 30.0–36.0)
MCV: 84.4 fL (ref 78.0–100.0)
PLATELETS: 291 10*3/uL (ref 150–400)
RBC: 4.67 MIL/uL (ref 4.22–5.81)
RDW: 14.2 % (ref 11.5–15.5)
WBC: 6.3 10*3/uL (ref 4.0–10.5)

## 2016-10-23 LAB — URINALYSIS, ROUTINE W REFLEX MICROSCOPIC
BILIRUBIN URINE: NEGATIVE
Glucose, UA: NEGATIVE mg/dL
HGB URINE DIPSTICK: NEGATIVE
Ketones, ur: 5 mg/dL — AB
Leukocytes, UA: NEGATIVE
NITRITE: NEGATIVE
Protein, ur: NEGATIVE mg/dL
Specific Gravity, Urine: 1.017 (ref 1.005–1.030)
pH: 6 (ref 5.0–8.0)

## 2016-10-23 LAB — BASIC METABOLIC PANEL
ANION GAP: 8 (ref 5–15)
BUN: 14 mg/dL (ref 6–20)
CALCIUM: 9.2 mg/dL (ref 8.9–10.3)
CO2: 27 mmol/L (ref 22–32)
CREATININE: 0.87 mg/dL (ref 0.61–1.24)
Chloride: 105 mmol/L (ref 101–111)
GFR calc Af Amer: >60 mL/min (ref >60)
GLUCOSE: 90 mg/dL (ref 65–99)
Potassium: 3.7 mmol/L (ref 3.5–5.1)
Sodium: 140 mmol/L (ref 135–145)

## 2016-10-23 NOTE — Progress Notes (Signed)
Orthopedic Tech Progress Note Patient Details:  Evan Charles 04-02-1946 [id number]  Ortho Devices Type of Ortho Device: Ace wrap, Volar splint Ortho Device/Splint Location: LUE Ortho Device/Splint Interventions: Ordered, Application   Braulio Bosch 10/23/2016, 10:03 PM

## 2016-10-23 NOTE — ED Notes (Signed)
[name] wants to "know what is going on."  [phone]

## 2016-10-23 NOTE — ED Provider Notes (Signed)
[facility] DEPT Provider Note   CSN: [id number] Arrival date & time: 10/23/16  1410     History   Chief Complaint Chief Complaint  Patient presents with  . Chest Pain  . Fall    HPI Evan Charles is a 70 y.o. male.  Pt presents to the hospital after 2 falls.  He said that he normally has balance issues, lost his balance, and fell.  The pt complains of left rib pain and left hand pain.  He also said that he hit his head and has a headache.  The pt's legs are also swollen.  Pt was admitted to the hospital from 10/27-11/2 due to encephalopathy and weakness.  He was sent to the SNF from there.  He is at home now.        Past Medical History:  Diagnosis Date  . Back pain   . Chronic mental illness   . Colitis   . GERD (gastroesophageal reflux disease)   . Hypertension   . Neuropathy ([place])    " MY HANDS "  . Prostate cancer [facility])     Patient Active Problem List   Diagnosis Date Noted  . Dementia 09/12/2016  . Acute encephalopathy 09/08/2016  . Rhabdomyolysis 09/08/2016  . Normocytic anemia 09/08/2016  . Altered mental status 09/08/2016  . Fall at home   . UTI (lower urinary tract infection) 07/25/2016  . Falls 07/25/2016  . Mild cognitive impairment 07/25/2016  . 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 Immobility 03/20/2016  . Generalized weakness 03/20/2016  . Failure to thrive in adult 03/20/2016  . Neuropathy ([place])   . Essential hypertension   . Physical deconditioning   . 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 GERD without esophagitis 12/19/2015  . Bilateral lower extremity edema 12/19/2015  . Intractable low back pain 02/14/2015  . Difficulty walking 02/14/2015  . Prostate cancer ([place]) 02/14/2015  . H/O: upper GI bleed   . Chronic back pain   . Visual hallucinations   . Psychoses     Past Surgical History:  Procedure Laterality Date  . KNEE ARTHROSCOPY    . LAMINECTOMY    . PROSTATECTOMY         Home  Medications    Prior to Admission medications   Medication Sig Start Date End Date Taking? Authorizing Provider  amLODipine (NORVASC) 5 MG tablet Take 1 tablet (5 mg total) by mouth daily. 09/14/16  Yes Barton Dubois, MD  docusate sodium (COLACE) 100 MG capsule Take 1 capsule (100 mg total) by mouth 2 (two) times daily. Patient taking differently: Take 100 mg by mouth 2 (two) times daily as needed (constipation).  03/22/16  Yes Ripudeep Krystal Eaton, MD  hydrochlorothiazide (MICROZIDE) 12.5 MG capsule Take 12.5 mg by mouth daily. 08/21/16  Yes Historical Provider, MD  pantoprazole (PROTONIX) 40 MG tablet Take 1 tablet (40 mg total) by mouth 2 (two) times daily. 12/16/15  Yes Leo Grosser, MD  QUEtiapine (SEROQUEL) 25 MG tablet Take 1 tablet (25 mg total) by mouth at bedtime. 09/13/16  Yes Barton Dubois, MD  traMADol (ULTRAM) 50 MG tablet Take 1 tablet (50 mg total) by mouth every 8 (eight) hours as needed for severe pain. 09/13/16  Yes Barton Dubois, MD  acetaminophen (TYLENOL) 325 MG tablet Take 2 tablets (650 mg total) by mouth every 6 (six) hours as needed for mild pain (or Fever >/= 101). Patient not taking: Reported on 10/23/2016 09/13/16   Barton Dubois, MD  bisacodyl (  DULCOLAX) 10 MG suppository Place 1 suppository (10 mg total) rectally daily as needed for moderate constipation. Patient not taking: Reported on 10/23/2016 09/13/16   Barton Dubois, MD  iron polysaccharides (NIFEREX) 150 MG capsule Take 1 capsule (150 mg total) by mouth daily. Patient not taking: Reported on 10/23/2016 09/14/16   Barton Dubois, MD  meloxicam (MOBIC) 7.5 MG tablet Take 1 tablet (7.5 mg total) by mouth daily. Patient not taking: Reported on 10/23/2016 03/22/16   Ripudeep Krystal Eaton, MD  vitamin B-12 1000 MCG tablet Take 1 tablet (1,000 mcg total) by mouth daily. Patient not taking: Reported on 10/23/2016 09/14/16   Barton Dubois, MD    Family History Family History  Problem Relation Age of Onset  . Cancer Brother   . Cancer  Maternal Grandmother     Social History Social History  Substance Use Topics  . Smoking status: Former Smoker    Types: Cigarettes  . Smokeless tobacco: Never Used  . Alcohol use No     Allergies   Tomato   Review of Systems Review of Systems  Musculoskeletal:       Left Chest wall pain, left hand pain  Neurological: Positive for dizziness.  All other systems reviewed and are negative.    Physical Exam Updated Vital Signs BP 132/99 (BP Location: Right Arm)   Pulse 85   Temp 98.1 F (36.7 C) (Oral)   Resp 16   Ht 5\' 9"  (1.753 m)   Wt 188 lb (85.3 kg)   SpO2 99%   BMI 27.76 kg/m   Physical Exam  Constitutional: He is oriented to person, place, and time. He appears well-developed and well-nourished.  HENT:  Head: Normocephalic and atraumatic.  Right Ear: External ear normal.  Left Ear: External ear normal.  Nose: Nose normal.  Mouth/Throat: Oropharynx is clear and moist.  Eyes: Conjunctivae and EOM are normal. Pupils are equal, round, and reactive to light.  Neck: Normal range of motion. Neck supple.  Cardiovascular: Normal rate, regular rhythm, normal heart sounds and intact distal pulses.   Pulmonary/Chest: Effort normal and breath sounds normal.  Abdominal: Soft. Bowel sounds are normal.  Musculoskeletal:       Arms: Left hand with contracture.  Unable to extend wrist.  Neurological: He is alert and oriented to person, place, and time.  Skin: Skin is warm.  Psychiatric: He has a normal mood and affect. His behavior is normal. Judgment and thought content normal.  Nursing note and vitals reviewed.    ED Treatments / Results  Labs (all labs ordered are listed, but only abnormal results are displayed) Labs Reviewed  CBC - Abnormal; Notable for the following:       Result Value   Hemoglobin 12.8 (*)    All other components within normal limits  URINALYSIS, ROUTINE W REFLEX MICROSCOPIC - Abnormal; Notable for the following:    Ketones, ur 5 (*)     All other components within normal limits  BASIC METABOLIC PANEL  I-STAT TROPOININ, ED  I-STAT TROPOININ, ED    EKG  EKG Interpretation  Date/Time:  Tuesday October 23 2016 14:25:50 EST Ventricular Rate:  86 PR Interval:  102 QRS Duration: 152 QT Interval:  456 QTC Calculation: 545 R Axis:   121 Text Interpretation:  Undetermined rhythm Right bundle branch block Abnormal ECG No significant change since last tracing Confirmed by [facility] MD, Zyniah Ferraiolo ([id number]) on 10/23/2016 7:04:55 PM       Radiology Dg Chest 2 View  Result Date:  10/23/2016 CLINICAL DATA:  Mid chest pain for the past 2 days associated with severe back pain. Episode of pneumonia 2 weeks ago. History of prostate malignancy, former smoker. EXAM: CHEST  2 VIEW COMPARISON:  Portable chest x-ray of September 07, 2016 FINDINGS: There is chronic elevation of the left hemidiaphragm. There is no focal infiltrate or pleural effusion. The heart and pulmonary vascularity are normal. The mediastinum is normal in width. There is multilevel degenerative disc disease of the thoracic spine. There is no compression fracture of the thoracic spine. IMPRESSION: There is no acute cardiopulmonary abnormality. Chronic elevation of the left hemidiaphragm. Multilevel degenerative disc disease of the thoracic spine. Electronically Signed   By: David  Martinique M.D.   On: 10/23/2016 15:20   Ct Head Wo Contrast  Result Date: 10/23/2016 CLINICAL DATA:  Status post fall with headache EXAM: CT HEAD WITHOUT CONTRAST TECHNIQUE: Contiguous axial images were obtained from the base of the skull through the vertex without intravenous contrast. COMPARISON:  Head CT 09/07/2016 FINDINGS: Brain: No mass lesion, intraparenchymal hemorrhage or extra-axial collection. No evidence of acute cortical infarct. Brain parenchyma and CSF-containing spaces are normal for age. Vascular: No hyperdense vessel or unexpected calcification. Skull: Normal visualized skull base, calvarium  and extracranial soft tissues. Sinuses/Orbits: No sinus fluid levels or advanced mucosal thickening. No mastoid effusion. Normal orbits. IMPRESSION: No acute intracranial abnormality. Electronically Signed   By: Ulyses Jarred M.D.   On: 10/23/2016 20:26   Dg Hand Complete Left  Result Date: 10/23/2016 CLINICAL DATA:  70 year old male with history of trauma from a fall 2 days ago complaining of left hand dysfunction. Unable to open hand and grip items. EXAM: LEFT HAND - COMPLETE 3+ VIEW COMPARISON:  No priors. FINDINGS: Three nonstandard views of the hand are submitted for evaluation. There is some mild joint space loss and subchondral sclerosis at the second and third MCP joints. Possible mild volar subluxation of both the second and third MCP joints as well, although this could simply be positional. Small os a chondroma off seen radial aspect of the distal aspect of the third middle phalanx. No acute displaced fracture or dislocation. IMPRESSION: 1. The appearance of the second and third MCP joints is unusual, however, this is largely favored to be positional and degenerative on these nonstandard views. No definite radiographic evidence of significant acute traumatic injury to the hand. Electronically Signed   By: Vinnie Langton M.D.   On: 10/23/2016 20:11    Procedures Procedures (including critical care time)  Medications Ordered in ED Medications - No data to display   Initial Impression / Assessment and Plan / ED Course  I have reviewed the triage vital signs and the nursing notes.  Pertinent labs & imaging results that were available during my care of the patient were reviewed by me and considered in my medical decision making (see chart for details).  Clinical Course     Pt is unable to extend his left wrist and his hand is curled under like he has had a stroke with contractures.  I asked him multiple times and he said the left hand was not like that until he hit it on the door.   Mechanism does not make sense.  No CVA on CT and sx started a few days ago.  Pt placed in a volar splint and is told to f/u with ortho.  He knows to return if worse.  Final Clinical Impressions(s) / ED Diagnoses   Final diagnoses:  Fall, initial  encounter  Muscle strain of left wrist, initial encounter  Chest wall contusion, left, initial encounter    New Prescriptions New Prescriptions   No medications on file     Isla Pence, MD 10/23/16 2235

## 2016-10-23 NOTE — ED Triage Notes (Signed)
Pt reports falling x 2 yesterday. Having chest pain after the fall. Has left hand pain and reports bilateral leg swelling. No resp distress is noted. ekg done at triage.

## 2016-10-23 NOTE — ED Notes (Signed)
Pt verbalized understanding of d/c instructions and has no further questions. Pt is stable, A&Ox4, VSS.  

## 2016-10-23 NOTE — ED Notes (Signed)
Patient transported to X-ray 

## 2016-10-24 DIAGNOSIS — Z9181 History of falling: Secondary | ICD-10-CM | POA: Diagnosis not present

## 2016-10-24 DIAGNOSIS — M810 Age-related osteoporosis without current pathological fracture: Secondary | ICD-10-CM | POA: Diagnosis not present

## 2016-10-24 DIAGNOSIS — I10 Essential (primary) hypertension: Secondary | ICD-10-CM | POA: Diagnosis not present

## 2016-10-24 DIAGNOSIS — J189 Pneumonia, unspecified organism: Secondary | ICD-10-CM | POA: Diagnosis not present

## 2016-10-24 DIAGNOSIS — M545 Low back pain: Secondary | ICD-10-CM | POA: Diagnosis not present

## 2016-10-24 DIAGNOSIS — D649 Anemia, unspecified: Secondary | ICD-10-CM | POA: Diagnosis not present

## 2016-10-24 DIAGNOSIS — M47892 Other spondylosis, cervical region: Secondary | ICD-10-CM | POA: Diagnosis not present

## 2016-10-24 DIAGNOSIS — R600 Localized edema: Secondary | ICD-10-CM | POA: Diagnosis not present

## 2016-10-24 DIAGNOSIS — M4802 Spinal stenosis, cervical region: Secondary | ICD-10-CM | POA: Diagnosis not present

## 2016-10-24 DIAGNOSIS — K219 Gastro-esophageal reflux disease without esophagitis: Secondary | ICD-10-CM | POA: Diagnosis not present

## 2016-10-24 DIAGNOSIS — M6282 Rhabdomyolysis: Secondary | ICD-10-CM | POA: Diagnosis not present

## 2016-10-24 DIAGNOSIS — G8929 Other chronic pain: Secondary | ICD-10-CM | POA: Diagnosis not present

## 2016-10-26 DIAGNOSIS — J189 Pneumonia, unspecified organism: Secondary | ICD-10-CM | POA: Diagnosis not present

## 2016-10-26 DIAGNOSIS — R600 Localized edema: Secondary | ICD-10-CM | POA: Diagnosis not present

## 2016-10-26 DIAGNOSIS — Z9181 History of falling: Secondary | ICD-10-CM | POA: Diagnosis not present

## 2016-10-26 DIAGNOSIS — G8929 Other chronic pain: Secondary | ICD-10-CM | POA: Diagnosis not present

## 2016-10-26 DIAGNOSIS — M47892 Other spondylosis, cervical region: Secondary | ICD-10-CM | POA: Diagnosis not present

## 2016-10-26 DIAGNOSIS — M6282 Rhabdomyolysis: Secondary | ICD-10-CM | POA: Diagnosis not present

## 2016-10-26 DIAGNOSIS — M4802 Spinal stenosis, cervical region: Secondary | ICD-10-CM | POA: Diagnosis not present

## 2016-10-26 DIAGNOSIS — K219 Gastro-esophageal reflux disease without esophagitis: Secondary | ICD-10-CM | POA: Diagnosis not present

## 2016-10-26 DIAGNOSIS — D649 Anemia, unspecified: Secondary | ICD-10-CM | POA: Diagnosis not present

## 2016-10-26 DIAGNOSIS — M545 Low back pain: Secondary | ICD-10-CM | POA: Diagnosis not present

## 2016-10-26 DIAGNOSIS — M810 Age-related osteoporosis without current pathological fracture: Secondary | ICD-10-CM | POA: Diagnosis not present

## 2016-10-26 DIAGNOSIS — I10 Essential (primary) hypertension: Secondary | ICD-10-CM | POA: Diagnosis not present

## 2016-10-29 DIAGNOSIS — Z9181 History of falling: Secondary | ICD-10-CM | POA: Diagnosis not present

## 2016-10-29 DIAGNOSIS — I10 Essential (primary) hypertension: Secondary | ICD-10-CM | POA: Diagnosis not present

## 2016-10-29 DIAGNOSIS — J189 Pneumonia, unspecified organism: Secondary | ICD-10-CM | POA: Diagnosis not present

## 2016-10-29 DIAGNOSIS — G8929 Other chronic pain: Secondary | ICD-10-CM | POA: Diagnosis not present

## 2016-10-29 DIAGNOSIS — M4802 Spinal stenosis, cervical region: Secondary | ICD-10-CM | POA: Diagnosis not present

## 2016-10-29 DIAGNOSIS — R600 Localized edema: Secondary | ICD-10-CM | POA: Diagnosis not present

## 2016-10-29 DIAGNOSIS — M545 Low back pain: Secondary | ICD-10-CM | POA: Diagnosis not present

## 2016-10-29 DIAGNOSIS — M810 Age-related osteoporosis without current pathological fracture: Secondary | ICD-10-CM | POA: Diagnosis not present

## 2016-10-29 DIAGNOSIS — M47892 Other spondylosis, cervical region: Secondary | ICD-10-CM | POA: Diagnosis not present

## 2016-10-29 DIAGNOSIS — K219 Gastro-esophageal reflux disease without esophagitis: Secondary | ICD-10-CM | POA: Diagnosis not present

## 2016-10-29 DIAGNOSIS — D649 Anemia, unspecified: Secondary | ICD-10-CM | POA: Diagnosis not present

## 2016-10-29 DIAGNOSIS — M6282 Rhabdomyolysis: Secondary | ICD-10-CM | POA: Diagnosis not present

## 2016-10-30 DIAGNOSIS — K219 Gastro-esophageal reflux disease without esophagitis: Secondary | ICD-10-CM | POA: Diagnosis not present

## 2016-10-30 DIAGNOSIS — R600 Localized edema: Secondary | ICD-10-CM | POA: Diagnosis not present

## 2016-10-30 DIAGNOSIS — J189 Pneumonia, unspecified organism: Secondary | ICD-10-CM | POA: Diagnosis not present

## 2016-10-30 DIAGNOSIS — M810 Age-related osteoporosis without current pathological fracture: Secondary | ICD-10-CM | POA: Diagnosis not present

## 2016-10-30 DIAGNOSIS — M6282 Rhabdomyolysis: Secondary | ICD-10-CM | POA: Diagnosis not present

## 2016-10-30 DIAGNOSIS — M47892 Other spondylosis, cervical region: Secondary | ICD-10-CM | POA: Diagnosis not present

## 2016-10-30 DIAGNOSIS — Z9181 History of falling: Secondary | ICD-10-CM | POA: Diagnosis not present

## 2016-10-30 DIAGNOSIS — D649 Anemia, unspecified: Secondary | ICD-10-CM | POA: Diagnosis not present

## 2016-10-30 DIAGNOSIS — G8929 Other chronic pain: Secondary | ICD-10-CM | POA: Diagnosis not present

## 2016-10-30 DIAGNOSIS — I10 Essential (primary) hypertension: Secondary | ICD-10-CM | POA: Diagnosis not present

## 2016-10-30 DIAGNOSIS — M545 Low back pain: Secondary | ICD-10-CM | POA: Diagnosis not present

## 2016-10-30 DIAGNOSIS — M4802 Spinal stenosis, cervical region: Secondary | ICD-10-CM | POA: Diagnosis not present

## 2016-11-01 DIAGNOSIS — R600 Localized edema: Secondary | ICD-10-CM | POA: Diagnosis not present

## 2016-11-01 DIAGNOSIS — M545 Low back pain: Secondary | ICD-10-CM | POA: Diagnosis not present

## 2016-11-01 DIAGNOSIS — Z9181 History of falling: Secondary | ICD-10-CM | POA: Diagnosis not present

## 2016-11-01 DIAGNOSIS — K219 Gastro-esophageal reflux disease without esophagitis: Secondary | ICD-10-CM | POA: Diagnosis not present

## 2016-11-01 DIAGNOSIS — M47892 Other spondylosis, cervical region: Secondary | ICD-10-CM | POA: Diagnosis not present

## 2016-11-01 DIAGNOSIS — J189 Pneumonia, unspecified organism: Secondary | ICD-10-CM | POA: Diagnosis not present

## 2016-11-01 DIAGNOSIS — D649 Anemia, unspecified: Secondary | ICD-10-CM | POA: Diagnosis not present

## 2016-11-01 DIAGNOSIS — I10 Essential (primary) hypertension: Secondary | ICD-10-CM | POA: Diagnosis not present

## 2016-11-01 DIAGNOSIS — M810 Age-related osteoporosis without current pathological fracture: Secondary | ICD-10-CM | POA: Diagnosis not present

## 2016-11-01 DIAGNOSIS — M6282 Rhabdomyolysis: Secondary | ICD-10-CM | POA: Diagnosis not present

## 2016-11-01 DIAGNOSIS — G8929 Other chronic pain: Secondary | ICD-10-CM | POA: Diagnosis not present

## 2016-11-01 DIAGNOSIS — M4802 Spinal stenosis, cervical region: Secondary | ICD-10-CM | POA: Diagnosis not present

## 2016-11-02 ENCOUNTER — Emergency Department (HOSPITAL_COMMUNITY): Payer: Medicare Other

## 2016-11-02 ENCOUNTER — Encounter (HOSPITAL_COMMUNITY): Payer: Self-pay | Admitting: Nurse Practitioner

## 2016-11-02 ENCOUNTER — Inpatient Hospital Stay (HOSPITAL_COMMUNITY)
Admission: EM | Admit: 2016-11-02 | Discharge: 2016-11-09 | DRG: 557 | Disposition: A | Discharge status: Home / Self Care | Payer: Medicare Other | Attending: Family Medicine | Admitting: Family Medicine

## 2016-11-02 DIAGNOSIS — G92 Toxic encephalopathy: Secondary | ICD-10-CM | POA: Diagnosis not present

## 2016-11-02 DIAGNOSIS — R030 Elevated blood-pressure reading, without diagnosis of hypertension: Secondary | ICD-10-CM | POA: Diagnosis not present

## 2016-11-02 DIAGNOSIS — K219 Gastro-esophageal reflux disease without esophagitis: Secondary | ICD-10-CM | POA: Diagnosis present

## 2016-11-02 DIAGNOSIS — D649 Anemia, unspecified: Secondary | ICD-10-CM | POA: Diagnosis not present

## 2016-11-02 DIAGNOSIS — G589 Mononeuropathy, unspecified: Secondary | ICD-10-CM | POA: Diagnosis not present

## 2016-11-02 DIAGNOSIS — T404X5A Adverse effect of other synthetic narcotics, initial encounter: Secondary | ICD-10-CM | POA: Diagnosis present

## 2016-11-02 DIAGNOSIS — M545 Low back pain: Secondary | ICD-10-CM | POA: Diagnosis not present

## 2016-11-02 DIAGNOSIS — M79643 Pain in unspecified hand: Secondary | ICD-10-CM

## 2016-11-02 DIAGNOSIS — T796XXS Traumatic ischemia of muscle, sequela: Secondary | ICD-10-CM | POA: Diagnosis not present

## 2016-11-02 DIAGNOSIS — F0390 Unspecified dementia without behavioral disturbance: Secondary | ICD-10-CM | POA: Diagnosis present

## 2016-11-02 DIAGNOSIS — G9341 Metabolic encephalopathy: Secondary | ICD-10-CM | POA: Diagnosis present

## 2016-11-02 DIAGNOSIS — Z7409 Other reduced mobility: Secondary | ICD-10-CM

## 2016-11-02 DIAGNOSIS — M6281 Muscle weakness (generalized): Secondary | ICD-10-CM | POA: Diagnosis not present

## 2016-11-02 DIAGNOSIS — E876 Hypokalemia: Secondary | ICD-10-CM | POA: Diagnosis not present

## 2016-11-02 DIAGNOSIS — M4807 Spinal stenosis, lumbosacral region: Secondary | ICD-10-CM | POA: Diagnosis not present

## 2016-11-02 DIAGNOSIS — Z87891 Personal history of nicotine dependence: Secondary | ICD-10-CM | POA: Diagnosis not present

## 2016-11-02 DIAGNOSIS — Z91018 Allergy to other foods: Secondary | ICD-10-CM

## 2016-11-02 DIAGNOSIS — Z809 Family history of malignant neoplasm, unspecified: Secondary | ICD-10-CM

## 2016-11-02 DIAGNOSIS — R279 Unspecified lack of coordination: Secondary | ICD-10-CM | POA: Diagnosis not present

## 2016-11-02 DIAGNOSIS — R627 Adult failure to thrive: Secondary | ICD-10-CM | POA: Diagnosis not present

## 2016-11-02 DIAGNOSIS — I452 Bifascicular block: Secondary | ICD-10-CM | POA: Diagnosis not present

## 2016-11-02 DIAGNOSIS — Z808 Family history of malignant neoplasm of other organs or systems: Secondary | ICD-10-CM | POA: Diagnosis not present

## 2016-11-02 DIAGNOSIS — R531 Weakness: Secondary | ICD-10-CM | POA: Diagnosis not present

## 2016-11-02 DIAGNOSIS — M24532 Contracture, left wrist: Secondary | ICD-10-CM | POA: Diagnosis not present

## 2016-11-02 DIAGNOSIS — G9340 Encephalopathy, unspecified: Secondary | ICD-10-CM

## 2016-11-02 DIAGNOSIS — G629 Polyneuropathy, unspecified: Secondary | ICD-10-CM | POA: Diagnosis present

## 2016-11-02 DIAGNOSIS — I10 Essential (primary) hypertension: Secondary | ICD-10-CM | POA: Diagnosis present

## 2016-11-02 DIAGNOSIS — M6282 Rhabdomyolysis: Principal | ICD-10-CM | POA: Diagnosis present

## 2016-11-02 DIAGNOSIS — Z9181 History of falling: Secondary | ICD-10-CM | POA: Diagnosis not present

## 2016-11-02 DIAGNOSIS — R748 Abnormal levels of other serum enzymes: Secondary | ICD-10-CM | POA: Diagnosis not present

## 2016-11-02 DIAGNOSIS — R262 Difficulty in walking, not elsewhere classified: Secondary | ICD-10-CM | POA: Diagnosis not present

## 2016-11-02 DIAGNOSIS — Z8546 Personal history of malignant neoplasm of prostate: Secondary | ICD-10-CM | POA: Diagnosis not present

## 2016-11-02 DIAGNOSIS — Z79899 Other long term (current) drug therapy: Secondary | ICD-10-CM | POA: Diagnosis not present

## 2016-11-02 DIAGNOSIS — R296 Repeated falls: Secondary | ICD-10-CM | POA: Diagnosis not present

## 2016-11-02 DIAGNOSIS — R410 Disorientation, unspecified: Secondary | ICD-10-CM | POA: Diagnosis present

## 2016-11-02 DIAGNOSIS — Z9889 Other specified postprocedural states: Secondary | ICD-10-CM | POA: Diagnosis not present

## 2016-11-02 DIAGNOSIS — F0392 Unspecified dementia, unspecified severity, with psychotic disturbance: Secondary | ICD-10-CM | POA: Diagnosis present

## 2016-11-02 DIAGNOSIS — R51 Headache: Secondary | ICD-10-CM | POA: Diagnosis not present

## 2016-11-02 DIAGNOSIS — M7981 Nontraumatic hematoma of soft tissue: Secondary | ICD-10-CM | POA: Diagnosis not present

## 2016-11-02 DIAGNOSIS — R937 Abnormal findings on diagnostic imaging of other parts of musculoskeletal system: Secondary | ICD-10-CM | POA: Diagnosis not present

## 2016-11-02 DIAGNOSIS — F0391 Unspecified dementia with behavioral disturbance: Secondary | ICD-10-CM | POA: Diagnosis not present

## 2016-11-02 DIAGNOSIS — R9431 Abnormal electrocardiogram [ECG] [EKG]: Secondary | ICD-10-CM | POA: Diagnosis not present

## 2016-11-02 HISTORY — DX: Unspecified dementia, unspecified severity, without behavioral disturbance, psychotic disturbance, mood disturbance, and anxiety: F03.90

## 2016-11-02 LAB — CBC
HEMATOCRIT: 38 % — AB (ref 39.0–52.0)
HEMOGLOBIN: 12.2 g/dL — AB (ref 13.0–17.0)
MCH: 26.8 pg (ref 26.0–34.0)
MCHC: 32.1 g/dL (ref 30.0–36.0)
MCV: 83.5 fL (ref 78.0–100.0)
Platelets: 311 10*3/uL (ref 150–400)
RBC: 4.55 MIL/uL (ref 4.22–5.81)
RDW: 13.7 % (ref 11.5–15.5)
WBC: 6.7 10*3/uL (ref 4.0–10.5)

## 2016-11-02 LAB — COMPREHENSIVE METABOLIC PANEL
ALBUMIN: 3.7 g/dL (ref 3.5–5.0)
ALK PHOS: 48 U/L (ref 38–126)
ALT: 20 U/L (ref 17–63)
AST: 42 U/L — AB (ref 15–41)
Anion gap: 10 (ref 5–15)
BILIRUBIN TOTAL: 1 mg/dL (ref 0.3–1.2)
BUN: 15 mg/dL (ref 6–20)
CALCIUM: 9 mg/dL (ref 8.9–10.3)
CO2: 24 mmol/L (ref 22–32)
Chloride: 105 mmol/L (ref 101–111)
Creatinine, Ser: 0.84 mg/dL (ref 0.61–1.24)
GFR calc Af Amer: >60 mL/min (ref >60)
GFR calc non Af Amer: >60 mL/min (ref >60)
GLUCOSE: 82 mg/dL (ref 65–99)
Potassium: 3.6 mmol/L (ref 3.5–5.1)
Sodium: 139 mmol/L (ref 135–145)
TOTAL PROTEIN: 6.4 g/dL — AB (ref 6.5–8.1)

## 2016-11-02 LAB — RAPID URINE DRUG SCREEN, HOSP PERFORMED
AMPHETAMINES: NOT DETECTED
Barbiturates: NOT DETECTED
Benzodiazepines: NOT DETECTED
Cocaine: NOT DETECTED
Opiates: NOT DETECTED
TETRAHYDROCANNABINOL: NOT DETECTED

## 2016-11-02 LAB — ETHANOL: Alcohol, Ethyl (B): <5 mg/dL (ref <5)

## 2016-11-02 LAB — PHOSPHORUS: PHOSPHORUS: 2.9 mg/dL (ref 2.5–4.6)

## 2016-11-02 LAB — CK: Total CK: 1480 U/L — ABNORMAL HIGH (ref 49–397)

## 2016-11-02 LAB — AMMONIA: Ammonia: 25 umol/L (ref 9–35)

## 2016-11-02 MED ORDER — PANTOPRAZOLE SODIUM 40 MG PO TBEC
40.0000 mg | DELAYED_RELEASE_TABLET | Freq: Two times a day (BID) | ORAL | Status: DC
  Administered 2016-11-02 – 2016-11-09 (×13): 40 mg via ORAL
  Filled 2016-11-02 (×15): qty 1

## 2016-11-02 MED ORDER — SODIUM CHLORIDE 0.9 % IV SOLN
INTRAVENOUS | Status: DC
  Administered 2016-11-02: 21:00:00 via INTRAVENOUS
  Administered 2016-11-05: 1000 mL via INTRAVENOUS

## 2016-11-02 MED ORDER — ENOXAPARIN SODIUM 40 MG/0.4ML ~~LOC~~ SOLN
40.0000 mg | SUBCUTANEOUS | Status: DC
  Administered 2016-11-02 – 2016-11-08 (×7): 40 mg via SUBCUTANEOUS
  Filled 2016-11-02 (×7): qty 0.4

## 2016-11-02 MED ORDER — DOCUSATE SODIUM 100 MG PO CAPS
100.0000 mg | ORAL_CAPSULE | Freq: Two times a day (BID) | ORAL | Status: DC | PRN
  Administered 2016-11-06: 100 mg via ORAL
  Filled 2016-11-02: qty 1

## 2016-11-02 MED ORDER — SODIUM CHLORIDE 0.9 % IV BOLUS (SEPSIS)
1000.0000 mL | Freq: Once | INTRAVENOUS | Status: AC
  Administered 2016-11-02: 1000 mL via INTRAVENOUS

## 2016-11-02 MED ORDER — QUETIAPINE FUMARATE 25 MG PO TABS
25.0000 mg | ORAL_TABLET | Freq: Every day | ORAL | Status: DC
  Administered 2016-11-02 – 2016-11-08 (×7): 25 mg via ORAL
  Filled 2016-11-02 (×7): qty 1

## 2016-11-02 NOTE — ED Provider Notes (Signed)
[facility] DEPT Provider Note   CSN: [id number] Arrival date & time: 11/02/16  1204     History   Chief Complaint Chief Complaint  Patient presents with  . Fall   L5 caveat: Confusion  HPI Evan Charles is a 70 y.o. male.  HPI Evan Charles is a 69 y.o. male with medical history significant for GERD, hypertension, unspecified mental illness, and chronic back pain who presents to the emergency department with acute encephalopathy after being found down at home.  He recently been hospitalized her skilled nursing facility but signed himself out several weeks ago.  He is been at home where he lives by himself and having home health care form twice a week.  Family has been coming over daily.  Home health reports increasing generalized weakness and confusion.  [employer] today states that the patient did not come to the door and so they called 911 and when 911 presented they found him lying on the kitchen floor confused and covered in feces and urine.  It's unclear how long he had been down but he did come to the door yesterday for [employer].  Patient ports no significant complaints at this time.  He is not oriented to place or time.  He does not remember how he ended up on the floor.    Past Medical History:  Diagnosis Date  . Back pain   . Chronic mental illness   . Colitis   . Dementia   . GERD (gastroesophageal reflux disease)   . Hypertension   . Neuropathy ([place])    " MY HANDS "  . Prostate cancer [facility])     Patient Active Problem List   Diagnosis Date Noted  . Dementia 09/12/2016  . Acute encephalopathy 09/08/2016  . Rhabdomyolysis 09/08/2016  . Normocytic anemia 09/08/2016  . Altered mental status 09/08/2016  . Fall at home   . UTI (lower urinary tract infection) 07/25/2016  . Falls 07/25/2016  . Mild cognitive impairment 07/25/2016  . 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 Immobility 03/20/2016  . Generalized weakness 03/20/2016  .  Failure to thrive in adult 03/20/2016  . Neuropathy ([place])   . Essential hypertension   . Physical deconditioning   . 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 GERD without esophagitis 12/19/2015  . Bilateral lower extremity edema 12/19/2015  . Intractable low back pain 02/14/2015  . Difficulty walking 02/14/2015  . Prostate cancer ([place]) 02/14/2015  . H/O: upper GI bleed   . Chronic back pain   . Visual hallucinations   . Psychoses     Past Surgical History:  Procedure Laterality Date  . KNEE ARTHROSCOPY    . LAMINECTOMY    . PROSTATECTOMY         Home Medications    Prior to Admission medications   Medication Sig Start Date End Date Taking? Authorizing Provider  acetaminophen (TYLENOL) 325 MG tablet Take 2 tablets (650 mg total) by mouth every 6 (six) hours as needed for mild pain (or Fever >/= 101). Patient not taking: Reported on 10/23/2016 09/13/16   Barton Dubois, MD  amLODipine (NORVASC) 5 MG tablet Take 1 tablet (5 mg total) by mouth daily. 09/14/16   Barton Dubois, MD  bisacodyl (DULCOLAX) 10 MG suppository Place 1 suppository (10 mg total) rectally daily as needed for moderate constipation. Patient not taking: Reported on 10/23/2016 09/13/16   Barton Dubois, MD  docusate sodium (COLACE) 100 MG capsule Take 1 capsule (  100 mg total) by mouth 2 (two) times daily. Patient taking differently: Take 100 mg by mouth 2 (two) times daily as needed (constipation).  03/22/16   Ripudeep Krystal Eaton, MD  hydrochlorothiazide (MICROZIDE) 12.5 MG capsule Take 12.5 mg by mouth daily. 08/21/16   Historical Provider, MD  iron polysaccharides (NIFEREX) 150 MG capsule Take 1 capsule (150 mg total) by mouth daily. Patient not taking: Reported on 10/23/2016 09/14/16   Barton Dubois, MD  meloxicam (MOBIC) 7.5 MG tablet Take 1 tablet (7.5 mg total) by mouth daily. Patient not taking: Reported on 10/23/2016 03/22/16   Ripudeep Krystal Eaton, MD  pantoprazole (PROTONIX) 40 MG tablet Take 1 tablet  (40 mg total) by mouth 2 (two) times daily. 12/16/15   Leo Grosser, MD  QUEtiapine (SEROQUEL) 25 MG tablet Take 1 tablet (25 mg total) by mouth at bedtime. 09/13/16   Barton Dubois, MD  traMADol (ULTRAM) 50 MG tablet Take 1 tablet (50 mg total) by mouth every 8 (eight) hours as needed for severe pain. 09/13/16   Barton Dubois, MD  vitamin B-12 1000 MCG tablet Take 1 tablet (1,000 mcg total) by mouth daily. Patient not taking: Reported on 10/23/2016 09/14/16   Barton Dubois, MD    Family History Family History  Problem Relation Age of Onset  . Cancer Brother   . Cancer Maternal Grandmother     Social History Social History  Substance Use Topics  . Smoking status: Former Smoker    Types: Cigarettes  . Smokeless tobacco: Never Used  . Alcohol use No     Allergies   Tomato   Review of Systems Review of Systems  All other systems reviewed and are negative.    Physical Exam Updated Vital Signs BP 132/96   Pulse 88   Temp 98.8 F (37.1 C) (Oral)   Resp 16   Ht 5\' 9"  (1.753 m)   Wt 188 lb (85.3 kg)   SpO2 99%   BMI 27.76 kg/m   Physical Exam  Constitutional: He appears well-developed and well-nourished.  HENT:  Head: Normocephalic and atraumatic.  Eyes: EOM are normal.  Neck: Normal range of motion.  Cardiovascular: Normal rate, regular rhythm, normal heart sounds and intact distal pulses.   Pulmonary/Chest: Effort normal and breath sounds normal. No respiratory distress.  Abdominal: Soft. He exhibits no distension. There is no tenderness.  Musculoskeletal: Normal range of motion.  Neurological: He is alert.  Moves all extremities equally but does appear to have a left wrist mononeuropathy as he is unable to extend his left wrist  Skin: Skin is warm and dry.  Psychiatric: He has a normal mood and affect. Judgment normal.  Nursing note and vitals reviewed.    ED Treatments / Results  Labs (all labs ordered are listed, but only abnormal results are  displayed) Labs Reviewed  CBC - Abnormal; Notable for the following:       Result Value   Hemoglobin 12.2 (*)    HCT 38.0 (*)    All other components within normal limits  COMPREHENSIVE METABOLIC PANEL - Abnormal; Notable for the following:    Total Protein 6.4 (*)    AST 42 (*)    All other components within normal limits  CK - Abnormal; Notable for the following:    Total CK 1,480 (*)    All other components within normal limits  AMMONIA  ETHANOL  RAPID URINE DRUG SCREEN, HOSP PERFORMED    EKG  EKG Interpretation None  Radiology Ct Head Wo Contrast  Result Date: 11/02/2016 CLINICAL DATA:  Dementia, found on floor today, altered mental status, headache, recent falls, hypertension, history prostate cancer EXAM: CT HEAD WITHOUT CONTRAST TECHNIQUE: Contiguous axial images were obtained from the base of the skull through the vertex without intravenous contrast. Sagittal and coronal MPR images reconstructed from axial data set. Scattered mild motion artifacts present. COMPARISON:  10/23/2016 FINDINGS: Brain: Generalized atrophy. Normal ventricular morphology. No midline shift or mass effect. Small vessel chronic ischemic changes of deep cerebral white matter. No intracranial hemorrhage, mass lesion, evidence of acute infarction, or extra-axial fluid collection. Dense calcification within falx. Vascular: Unremarkable Skull: Intact within limits of motion Sinuses/Orbits: Grossly clear within limits of motion Other: N/A IMPRESSION: Atrophy with small vessel chronic ischemic changes of deep cerebral white matter. No acute intracranial abnormalities. Electronically Signed   By: Lavonia Dana M.D.   On: 11/02/2016 13:34   Dg Chest Portable 1 View  Result Date: 11/02/2016 CLINICAL DATA:  Altered mental status and weakness. EXAM: PORTABLE CHEST 1 VIEW COMPARISON:  10/23/2016 FINDINGS: Chronic elevation left hemidiaphragm with left lower lobe scarring, unchanged from prior studies. Right  lung is clear. Negative for heart failure or pneumonia. No pleural effusion IMPRESSION: Chronic changes in the left lung base. No acute cardiopulmonary abnormality. Electronically Signed   By: Franchot Gallo M.D.   On: 11/02/2016 13:05    Procedures Procedures (including critical care time)  Medications Ordered in ED Medications - No data to display   Initial Impression / Assessment and Plan / ED Course  I have reviewed the triage vital signs and the nursing notes.  Pertinent labs & imaging results that were available during my care of the patient were reviewed by me and considered in my medical decision making (see chart for details).  Clinical Course     Patient found down and confused.  Workup today demonstrates elevation in his CK to nearly 1500.  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  He'll be placed in a cockup wrist splint for his left wrist.  Do not think he is safe for discharge home.  His been noted to be hallucinating in the room.  Final Clinical Impressions(s) / ED Diagnoses   Final diagnoses:  Acute encephalopathy  Elevated CK    New Prescriptions New Prescriptions   No medications on file     Jola Schmidt, MD 11/02/16 1625

## 2016-11-02 NOTE — ED Notes (Signed)
[facility] states [facility]. Home Health comes twice a week states patient has had severe decline over the past few weeks. Patient has been able to stand and walk slowly around house but has been progressively worsening. House is in terrible condition. Staff called social worker who was trying to work with adult protective services to assist patient. Patient has [place] appointment Jan 11. Patient was discharged from OT and PT with request to be transferred to SNF for appropriate care but patient does not want to go because he is concerned of losing his home. LPN also states patient has been hallucinating- seeing people on his couch and seeing people that aren't there.    [place]

## 2016-11-02 NOTE — Progress Notes (Signed)
Orthopedic Tech Progress Note Patient Details:  Evan Charles 1946-10-04 [id number]  Ortho Devices Type of Ortho Device: Velcro wrist splint Ortho Device/Splint Location: applied velcro wrist splint on left wrist/hand.  patient tolerated well.  Ortho Device/Splint Interventions: Application, Adjustment   Kristopher Oppenheim 11/02/2016, 5:08 PM

## 2016-11-02 NOTE — ED Notes (Signed)
Pt. To CT

## 2016-11-02 NOTE — ED Notes (Signed)
Drew pt's blood with Sarah(Phlebotomy) as witness

## 2016-11-02 NOTE — H&P (Signed)
[facility] Admission History and Physical Service Pager: [phone]  Patient name: Evan Charles Medical record number: [id number] Date of birth: [birth year] Age: 70 y.o. Gender: male  Primary Care Provider: PROVIDER NOT [place]  Consultants: none Code Status: FULL  Chief Complaint: altered mental status  Assessment and Plan: Evan Charles is a 70 y.o. male presenting with confusion after being found down at home. PMH is significant for GERD, hypertension, unspecified mental illness, prostate CA, neuropathy, GERD and chronic back pain.  Rhabdomyolysis: CK elevated to 1480 but renal function WNL (Cr 0.84) likely 2/2 laying on floor for prolonged period of time. K 3.6.  Denies myalgias on admit. No evidence of compartment syndrome on exam.  - Admit to inpatient, under Dr Gwendlyn Deutscher - IV hydration with 2L NS bolus, then MIVF NS@100  (pt has not received any IVFs in the ED).  - will add on a phosphorus level.  - will get a U/A to evaluate for myoglobinuria  - am CK and BMP - monitor UOP   Altered mental status: possibly due to delirium superimposed on dementia. Also possibly secondary to dehydration in setting of fall. No leukocytosis or urinary symptoms, however would like to r/o urinary source with U/A.  Patient is confused with unknown baseline function (per [place], patient's cognition is very variable). Currently is not oriented to time or self with tangential speech, and endorses occasional auditory and visual hallucinations. Home medication include seroquel but EKG on 10/24/16 shows prolonged QTc. - check EKG - continue home seroquel - seroquel can cause rhabdomyolysis, can consider discontinuation if no improvement in CK. - holding home tramadol.  - F/u UA.  - given patient's poor living conditions and HH concerns, will consider obtaining a psychiatry consult once the patient is more cleared to assess for capacity and assist with dementia  with mild psychosis.   Generalized weakness, h/o multiple falls at home. CT head neg. Patient had recent admission with similar presentation on 09/07/16, was discharged to SNF but after returning home has had progressive decline. Suspect will need higher level of care since patient lives at home alone and has no familial support. - PT/OT consult - social work consult   Left wrist contracture: concern by EDP about mononeuropathy due to immobility, however this was noted on ED visit on 12/12, at which time he was placed in a volar splint and was advised to f/u with ortho.  X-ray at that time notes it 2nd and 3rd MCPs looked unusual, however felt it was secondary to positional and degenerative changes.  - consider f/u with ortho as an outpatient.   H/o HTN. On admit BP 130/85. Given age and no other risk factors, is at goal BP - holding home medications - monitor BP  GERD - continue home protonix  H/o prostate CA s/p prostatectomy. Patient denies any urinary complaints currently.  FEN/GI: Regular diet, protonix, colace Prophylaxis: lovenox  Disposition: Pending PT/OT evaluation  History of Present Illness:  Evan Charles is a 70 y.o. male presenting with confusion after being found down at home. Per EMS was found on kitchen floor for unknown amount of time, altered mental status, covered in feces and urine. House was reportedly in bad condition, patient lives alone and per home health has had severe decline over the past few weeks after leaving SNF.  Patient is able to converse but is unreliable historian given his confusion and relatively incoherent speech. Patient at first believed that his  presence in the hospital was due to a practical joke. He states the last thing he remembers is being found at home. He denies being on the floor at home or falling with this episode "I was trying to get someone to help move, crawled across the bed, and stole" but he does admit that he couldn't stand  up, couldn't walk, and has fallen before. He also notes that has been more tired since leaving the SNF which he attributes to aging. Denies significant issues caring for himself at home. Denies any pain. No SOB, chest pain, dysuria, change in bowel function, fevers, or chills.   In the ED, there was concern for mononeuropathy of the L wrist as he was unable to extend it. A splint was placed on it by ortho. CT head was negative, however he was found to have a CK of 1480. All other electrolytes were normal. It was not felt the patient was safe for discharge and FPTS was asked to admit.   On admission, I spoke with [facility] home health nurse, Jenny Reichmann, who is very concerned about the patient's ability to live independently. She notes that the day prior to admission when OT went to his home, his chair was wrapped in a phone cord and the patient had fallen. The physical therapist had to be called to assist with the patient at that time. They also note that his cognition comes and goes- sometimes he is able to follow commands completely, however other times he is incoherent. She notes that there is no family support. Reportedly, either an ex-wife or his nephew checks on him daily, however the [facility] nurse notes this is not the case. She notes no one ensure he takes his medications (which he does not) and is home is very unkept. They have placed a complaint with APS however have not heard anything. His Education officer, museum at the [place] is [employer] however they do not have her contact information.     Review Of Systems: Per HPI. Otherwise as below but patient's mental status makes him an unreliable historian.  Review of Systems  Constitutional: Negative for chills and fever.  Respiratory: Negative for shortness of breath.   Cardiovascular: Negative for chest pain.  Gastrointestinal: Negative for abdominal pain, constipation and diarrhea.  Genitourinary: Negative for dysuria.  Musculoskeletal: Negative for back pain and  myalgias.  Psychiatric/Behavioral: Positive for hallucinations and memory loss.    Patient Active Problem List   Diagnosis Date Noted  . Dementia 09/12/2016  . Acute encephalopathy 09/08/2016  . Rhabdomyolysis 09/08/2016  . Normocytic anemia 09/08/2016  . Altered mental status 09/08/2016  . Fall at home   . UTI (lower urinary tract infection) 07/25/2016  . Falls 07/25/2016  . Mild cognitive impairment 07/25/2016  . 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 Immobility 03/20/2016  . Generalized weakness 03/20/2016  . Failure to thrive in adult 03/20/2016  . Neuropathy ([place])   . Essential hypertension   . Physical deconditioning   . 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 GERD without esophagitis 12/19/2015  . Bilateral lower extremity edema 12/19/2015  . Intractable low back pain 02/14/2015  . Difficulty walking 02/14/2015  . Prostate cancer ([place]) 02/14/2015  . H/O: upper GI bleed   . Chronic back pain   . Visual hallucinations   . Psychoses     Past Medical History: Past Medical History:  Diagnosis Date  . Back pain   . Chronic mental illness   . Colitis   .  Dementia   . GERD (gastroesophageal reflux disease)   . Hypertension   . Neuropathy ([place])    " MY HANDS "  . Prostate cancer [facility])     Past Surgical History: Past Surgical History:  Procedure Laterality Date  . KNEE ARTHROSCOPY    . LAMINECTOMY    . PROSTATECTOMY      Social History: Social History  Substance Use Topics  . Smoking status: Former Smoker    Types: Cigarettes  . Smokeless tobacco: Never Used  . Alcohol use No   Additional social history: Lives alone Please also refer to relevant sections of EMR.  Family History: Family History  Problem Relation Age of Onset  . Cancer Brother   . Cancer Maternal Grandmother     Allergies and Medications: Allergies  Allergen Reactions  . Tomato Itching and Rash   No current facility-administered medications on file prior  to encounter.    Current Outpatient Prescriptions on File Prior to Encounter  Medication Sig Dispense Refill  . acetaminophen (TYLENOL) 325 MG tablet Take 2 tablets (650 mg total) by mouth every 6 (six) hours as needed for mild pain (or Fever >/= 101). (Patient not taking: Reported on 10/23/2016)    . amLODipine (NORVASC) 5 MG tablet Take 1 tablet (5 mg total) by mouth daily.    . bisacodyl (DULCOLAX) 10 MG suppository Place 1 suppository (10 mg total) rectally daily as needed for moderate constipation. (Patient not taking: Reported on 10/23/2016)    . docusate sodium (COLACE) 100 MG capsule Take 1 capsule (100 mg total) by mouth 2 (two) times daily. (Patient taking differently: Take 100 mg by mouth 2 (two) times daily as needed (constipation). )    . hydrochlorothiazide (MICROZIDE) 12.5 MG capsule Take 12.5 mg by mouth daily.    . iron polysaccharides (NIFEREX) 150 MG capsule Take 1 capsule (150 mg total) by mouth daily. (Patient not taking: Reported on 10/23/2016)    . meloxicam (MOBIC) 7.5 MG tablet Take 1 tablet (7.5 mg total) by mouth daily. (Patient not taking: Reported on 10/23/2016)    . pantoprazole (PROTONIX) 40 MG tablet Take 1 tablet (40 mg total) by mouth 2 (two) times daily. 60 tablet 0  . QUEtiapine (SEROQUEL) 25 MG tablet Take 1 tablet (25 mg total) by mouth at bedtime.    . traMADol (ULTRAM) 50 MG tablet Take 1 tablet (50 mg total) by mouth every 8 (eight) hours as needed for severe pain. 20 tablet 0  . vitamin B-12 1000 MCG tablet Take 1 tablet (1,000 mcg total) by mouth daily. (Patient not taking: Reported on 10/23/2016)      Objective: BP 116/76   Pulse 84   Temp 98.8 F (37.1 C) (Oral)   Resp 13   Ht 5\' 9"  (1.753 m)   Wt 85.3 kg (188 lb)   SpO2 98%   BMI 27.76 kg/m  Exam: General: Lying in bed comfortably, in no distress Eyes: PERRL, conjunctiva normal ENTM: mildly dry MMM Neck: normal ROM Cardiovascular: RRR, no murmurs. Trace/1+ pitting edema  bilaterally Respiratory: CTAB, normal effort. Gastrointestinal: soft, nontender, nondistended, + bs MSK: Wrist splint on L UE. UE appear to have contractures bilaterally  Derm: warm and dry. B/l LE have dry scaly c/w mild stasis changes Neuro: Alert. He is oriented to place and first name. Was able to say last name after much contemplation. Needs prompting for year and month. Can follow directions. Facial movements symmetric. Tongue/uvula midline. 5/5 strength in UE and LE bilaterally.  Sensation grossly intact over the face, UE, and LE bilaterally. Neutral Babinski.  Psych: normal affect. Thought content tangential, difficult to get a history. Intermittently refers to "a family" in the room.  Denies hearing voices. No command type hallucinations.  Labs and Imaging: CBC BMET   Recent Labs Lab 11/02/16 1255  WBC 6.7  HGB 12.2*  HCT 38.0*  PLT 311    Recent Labs Lab 11/02/16 1255  NA 139  K 3.6  CL 105  CO2 24  BUN 15  CREATININE 0.84  GLUCOSE 82  CALCIUM 9.0     CK 1480 EtOH <5 UDS neg  Urinalysis    Component Value Date/Time   COLORURINE YELLOW 10/23/2016 [place] 10/23/2016 2204   LABSPEC 1.017 10/23/2016 2204   PHURINE 6.0 10/23/2016 2204   GLUCOSEU NEGATIVE 10/23/2016 2204   HGBUR NEGATIVE 10/23/2016 2204   BILIRUBINUR NEGATIVE 10/23/2016 2204   KETONESUR 5 (A) 10/23/2016 2204   PROTEINUR NEGATIVE 10/23/2016 2204   UROBILINOGEN 0.2 02/13/2015 1957   NITRITE NEGATIVE 10/23/2016 2204   LEUKOCYTESUR NEGATIVE 10/23/2016 2204   Ct Head Wo Contrast  Result Date: 11/02/2016 CLINICAL DATA:  Dementia, found on floor today, altered mental status, headache, recent falls, hypertension, history prostate cancer EXAM: CT HEAD WITHOUT CONTRAST TECHNIQUE: Contiguous axial images were obtained from the base of the skull through the vertex without intravenous contrast. Sagittal and coronal MPR images reconstructed from axial data set. Scattered mild motion  artifacts present. COMPARISON:  10/23/2016 FINDINGS: Brain: Generalized atrophy. Normal ventricular morphology. No midline shift or mass effect. Small vessel chronic ischemic changes of deep cerebral white matter. No intracranial hemorrhage, mass lesion, evidence of acute infarction, or extra-axial fluid collection. Dense calcification within falx. Vascular: Unremarkable Skull: Intact within limits of motion Sinuses/Orbits: Grossly clear within limits of motion Other: N/A IMPRESSION: Atrophy with small vessel chronic ischemic changes of deep cerebral white matter. No acute intracranial abnormalities. Electronically Signed   By: Lavonia Dana M.D.   On: 11/02/2016 13:34   Dg Chest Portable 1 View  Result Date: 11/02/2016 CLINICAL DATA:  Altered mental status and weakness. EXAM: PORTABLE CHEST 1 VIEW COMPARISON:  10/23/2016 FINDINGS: Chronic elevation left hemidiaphragm with left lower lobe scarring, unchanged from prior studies. Right lung is clear. Negative for heart failure or pneumonia. No pleural effusion IMPRESSION: Chronic changes in the left lung base. No acute cardiopulmonary abnormality. Electronically Signed   By: Franchot Gallo M.D.   On: 11/02/2016 13:05    Bufford Lope, DO 11/02/2016, 4:57 PM PGY-1, [place] Intern pager: [phone], text pages welcome  Upper Level Addendum:  I have seen and evaluated this patient along with Dr. Shawna Orleans and reviewed the above note, making necessary revisions in purple.   Archie Patten, MD [place] Resident, PGY-3

## 2016-11-02 NOTE — ED Triage Notes (Signed)
Per EMS pt from home lives alone and has dementia. Per patient ex-wife and nephew check on him daily. [facility] staff states normally patient comes to door to get meal and did not today so they went in and found him laying on the kitchen floor altered and covered in feces and urine. Pt was alert and oriented to self and location but was not sure when or how he ended up on the floor. Patient denies pain. Bilateral upper extremities are contracted. Patient has dried blood in left ear with small laceration. Pt skin cool and dry, pt vital signs WNL.   EMS called daughter who did not wanted to be contacted further. EMS notified DSS as well.

## 2016-11-03 ENCOUNTER — Inpatient Hospital Stay (HOSPITAL_COMMUNITY): Payer: Medicare Other

## 2016-11-03 DIAGNOSIS — T796XXS Traumatic ischemia of muscle, sequela: Secondary | ICD-10-CM

## 2016-11-03 DIAGNOSIS — R748 Abnormal levels of other serum enzymes: Secondary | ICD-10-CM

## 2016-11-03 DIAGNOSIS — G9340 Encephalopathy, unspecified: Secondary | ICD-10-CM

## 2016-11-03 LAB — URINALYSIS, ROUTINE W REFLEX MICROSCOPIC
Bilirubin Urine: NEGATIVE
Glucose, UA: NEGATIVE mg/dL
Hgb urine dipstick: NEGATIVE
KETONES UR: 20 mg/dL — AB
LEUKOCYTES UA: NEGATIVE
NITRITE: NEGATIVE
PH: 5 (ref 5.0–8.0)
Protein, ur: NEGATIVE mg/dL
SPECIFIC GRAVITY, URINE: 1.023 (ref 1.005–1.030)

## 2016-11-03 LAB — BASIC METABOLIC PANEL
ANION GAP: 2 — AB (ref 5–15)
BUN: 13 mg/dL (ref 6–20)
CHLORIDE: 109 mmol/L (ref 101–111)
CO2: 29 mmol/L (ref 22–32)
Calcium: 8.3 mg/dL — ABNORMAL LOW (ref 8.9–10.3)
Creatinine, Ser: 1.04 mg/dL (ref 0.61–1.24)
GFR calc Af Amer: >60 mL/min (ref >60)
GFR calc non Af Amer: >60 mL/min (ref >60)
Glucose, Bld: 103 mg/dL — ABNORMAL HIGH (ref 65–99)
Potassium: 3.4 mmol/L — ABNORMAL LOW (ref 3.5–5.1)
Sodium: 140 mmol/L (ref 135–145)

## 2016-11-03 LAB — CK
Total CK: 1108 U/L — ABNORMAL HIGH (ref 49–397)
Total CK: 1057 U/L — ABNORMAL HIGH (ref 49–397)

## 2016-11-03 MED ORDER — LORAZEPAM 2 MG/ML IJ SOLN
INTRAMUSCULAR | Status: AC
  Filled 2016-11-03: qty 1

## 2016-11-03 MED ORDER — SODIUM CHLORIDE 0.9 % IV BOLUS (SEPSIS)
1000.0000 mL | Freq: Once | INTRAVENOUS | Status: AC
  Administered 2016-11-03: 1000 mL via INTRAVENOUS

## 2016-11-03 MED ORDER — SODIUM CHLORIDE 0.9 % IV BOLUS (SEPSIS)
500.0000 mL | Freq: Once | INTRAVENOUS | Status: AC
  Administered 2016-11-04: 500 mL via INTRAVENOUS

## 2016-11-03 MED ORDER — ACETAMINOPHEN 325 MG PO TABS
650.0000 mg | ORAL_TABLET | Freq: Four times a day (QID) | ORAL | Status: DC | PRN
  Administered 2016-11-03: 650 mg via ORAL
  Filled 2016-11-03: qty 2

## 2016-11-03 NOTE — Progress Notes (Signed)
Family Medicine Teaching Service Daily Progress Note Intern Pager: [phone]  Patient name: Evan Charles Medical record number: [id number] Date of birth: 1946-04-15 Age: 70 y.o. Gender: male  Primary Care Provider: PROVIDER NOT [place]  Consultants: none, consider psych Code Status: full per prior code status  Pt Overview and Major Events to Date:  12/22: AMS, found down on floor covered in feces/urine. Head CT neg. Evan Charles  Assessment and Plan: Evan Charles is a 70 y.o. male presenting with confusion after being found down at home. PMH is significant for GERD, hypertension, unspecified mental illness, prostate CA, neuropathy, GERD and chronic back pain.  Rhabdomyolysis: CK elevated to 1480 but renal function WNL (Cr 0.84) with K of 3.6 on admission likely 2/2 to being down for extended period of time. Denies myalgias on admit. No evidence of compartment syndrome on exam. s/p 3.5L NS bolus since admission. - NS running at 150cc/hr, will decrease to 100cc/hr given downtrending CK - CK still elevated to 880 this AM - repeat CK in am -  U/A without protein, only ketones present - monitor UOP > total out 2300 + 1 unmeasured   Back pain: reported back pain to staff on 12/23. Reports to one examiner that he fell on his back, although story is variable between examiners.  -consider imaging to evaluate possibility of compression fracture if not improving  -Kpad and tylenol   Altered mental status: improved but still oftentimes incoherent with answering questions. Possibly due to delirium superimposed on dementia. Also possibly secondary to dehydration in setting of fall. No leukocytosis or urinary symptoms, and UA without signs of infection. Patient is confused with unknown baseline function (per [place], patient's cognition is very variable). Currently is not oriented to time or self with tangential speech, and endorses occasional auditory and visual hallucinations. Home  medication include seroquel but EKG on 10/24/16 shows prolonged QTc (slightly improved to 495 this hospitalization).  - continue home seroquel for now - seroquel can cause rhabdomyolysis, can consider discontinuation if slow improvement in CK. - holding home tramadol.  - given patient's poor living conditions and HH concerns, will consider obtaining a psychiatry consult once the patient is more cleared to assess for capacity and assist with dementia with mild psychosis.   Generalized weakness, h/o multiple falls at home. CT head neg. Patient had recent admission with similar presentation on 09/07/16, was discharged to SNF but after returning home has had progressive decline. Suspect will need higher level of care since patient lives at home alone and has no familial support. - PT/OT consult - social work consult   Left wrist contracture: concern by EDP about mononeuropathy due to immobility, however this was noted on ED visit on 12/12, at which time he was placed in a volar splint and was advised to f/u with ortho.  X-ray at that time notes the 2nd and 3rd MCPs looked unusual, however felt it was secondary to positional and degenerative changes. No tenderness on my exam.  - consider f/u with ortho as an outpatient.   H/o HTN. BPs 150s/80s this AM off antihypertensives.  - holding home medications - monitor BP  GERD - continue home protonix  H/o prostate CA s/p prostatectomy. Patient denies any urinary complaints currently.  FEN/GI: Regular diet, protonix, colace Prophylaxis: lovenox  Disposition: pending improvement in mentation, clearing of CK, PT/OT recs  Subjective:  Patient with mittens, new since my evaluation last night. Reporting he needs to urinate and confused about where  he is.   Objective: Temp:  [98.4 F (36.9 C)] 98.4 F (36.9 C) (12/23 0553) Pulse Rate:  [94] 94 (12/23 0553) Resp:  [18] 18 (12/23 0553) BP: (108)/(57) 108/57 (12/23 0553) SpO2:  [97 %] 97 %  (12/23 0553) Physical Exam: General: Lying in bed in NAD. Cardiovascular: RRR. No murmurs, rubs, or gallops noted. 1+ pitting edema noted. Respiratory: No increased WOB. CTAB without wheezing, rhonchi, or crackles noted over the anterior chest wall. Abdomen: +BS, soft, non-distended, non-tender.  MSK: Normal bulk and noted. Left wrist in splint, no tenderness to palpation. Skin: No rashes noted over exposed skin.  Neuro: alert, oriented to person, month, holiday. Speech clear. Thought content tangential. EOMI, Uvula and tongue midline. Facial movements symmetric. 5/5 strength in the upper extremities and lower extremities bilaterally. Sensation intact bilaterally grossly.  Psych:  Intermittently confused.  Laboratory:  Recent Labs Lab 11/02/16 1255  WBC 6.7  HGB 12.2*  HCT 38.0*  PLT 311    Recent Labs Lab 11/02/16 1255 11/03/16 0647  NA 139 140  K 3.6 3.4*  CL 105 109  CO2 24 29  BUN 15 13  CREATININE 0.84 1.04  CALCIUM 9.0 8.3*  PROT 6.4*  --   BILITOT 1.0  --   ALKPHOS 48  --   ALT 20  --   AST 42*  --   GLUCOSE 82 103*   CK 1480 > 1,108 EtOH <5 UDS neg  Imaging/Diagnostic Tests: Dg Hand Complete Left  Result Date: 11/03/2016 CLINICAL DATA:  Possible left hand injury. EXAM: LEFT HAND - COMPLETE 3+ VIEW COMPARISON:  10/23/2016 FINDINGS: No evidence for fracture or dislocation. Again noted is some joint space narrowing at the second and third MCP joints. Soft tissues are unremarkable. IMPRESSION: No acute abnormality in left hand. Electronically Signed   By: Markus Daft M.D.   On: 11/03/2016 12:59    Sela Hilding, MD 11/03/2016, 10:06 PM PGY-1, [place] Intern pager: [phone], text pages welcome

## 2016-11-03 NOTE — Evaluation (Signed)
Physical Therapy Evaluation Patient Details Name: Evan Charles MRN: [id number] DOB: 1946-01-01 Today's Date: 11/03/2016   History of Present Illness  70yo admitted after found down at home with rhabdomyolysis. PMHx: mental illness, back pain, HTN, GERD, prostate CA, TKA Rt, neuropathy  Clinical Impression  Pt pleasant and willing to mobilize who demonstrates cognitive, balance and strength deficits with inability to care for himself or call 911 with at least 5 falls this year. Pt will benefit from acute therapy to maximize mobility, function and strength to decrease fall risk. Recommend daily mobility with nursing staff and pt unable to recall current home health services or exactly what he gets assist for at baseline.     Follow Up Recommendations SNF;Supervision/Assistance - 24 hour    Equipment Recommendations  None recommended by PT    Recommendations for Other Services       Precautions / Restrictions Precautions Precautions: Fall      Mobility  Bed Mobility Overal bed mobility: Needs Assistance Bed Mobility: Supine to Sit     Supine to sit: Min guard;HOB elevated     General bed mobility comments: cues for sequence, use of rail, HOB 30 degrees, increased time  Transfers Overall transfer level: Needs assistance   Transfers: Sit to/from Stand Sit to Stand: Mod assist         General transfer comment: cues for hand placement, sequence and safety. pt pushing into Rt knee extension and posterior lean with attempt at standing with assist to block foot, rise and provide anterior translation  Ambulation/Gait Ambulation/Gait assistance: Min assist Ambulation Distance (Feet): 30 Feet Assistive device: Rolling walker (2 wheeled) Gait Pattern/deviations: Shuffle;Trunk flexed   Gait velocity interpretation: Below normal speed for age/gender General Gait Details: cues for posture, position in RW, assist to place LUE on RW and difficulty maintaining grip with Left  wrist splint   Stairs            Wheelchair Mobility    Modified Rankin (Stroke Patients Only)       Balance Overall balance assessment: Needs assistance   Sitting balance-Leahy Scale: Fair       Standing balance-Leahy Scale: Poor                               Pertinent Vitals/Pain Pain Assessment: No/denies pain    Home Living Family/patient expects to be discharged to:: Private residence Living Arrangements: Alone Available Help at Discharge: Family;Available PRN/intermittently Type of Home: House Home Access: Stairs to enter Entrance Stairs-Rails: Psychiatric nurse of Steps: 4 Home Layout: One level Home Equipment: Walker - 2 wheels;Cane - single point;Bedside commode Additional Comments: Pt is a poor historian, unable to determine reliability of information provided.    Prior Function Level of Independence: Independent with assistive device(s)         Comments: pt states he uses RW, assist from nephew for bathing, meals on wheels     Hand Dominance        Extremity/Trunk Assessment   Upper Extremity Assessment Upper Extremity Assessment: Generalized weakness    Lower Extremity Assessment Lower Extremity Assessment: Generalized weakness    Cervical / Trunk Assessment Cervical / Trunk Assessment: Kyphotic  Communication   Communication: No difficulties  Cognition Arousal/Alertness: Awake/alert Behavior During Therapy: WFL for tasks assessed/performed Overall Cognitive Status: Impaired/Different from baseline Area of Impairment: Orientation;Attention;Memory;Following commands;Safety/judgement Orientation Level: Disoriented to;Time;Situation Current Attention Level: Sustained Memory: Decreased short-term memory Following Commands: Follows  one step commands consistently Safety/Judgement: Decreased awareness of safety;Decreased awareness of deficits     General Comments: pt unable to state how to call 911,  unaware of date or situation, poor safety awareness and insight into deficits    General Comments      Exercises     Assessment/Plan    PT Assessment Patient needs continued PT services  PT Problem List Decreased strength;Decreased mobility;Decreased safety awareness;Decreased activity tolerance;Decreased range of motion;Decreased balance;Decreased knowledge of use of DME          PT Treatment Interventions DME instruction;Gait training;Therapeutic exercise;Patient/family education;Stair training;Balance training;Functional mobility training;Therapeutic activities    PT Goals (Current goals can be found in the Care Plan section)  Acute Rehab PT Goals Patient Stated Goal: return home PT Goal Formulation: With patient Time For Goal Achievement: 11/17/16 Potential to Achieve Goals: Fair    Frequency Min 3X/week   Barriers to discharge Decreased caregiver support      Co-evaluation               End of Session Equipment Utilized During Treatment: Gait belt Activity Tolerance: Patient tolerated treatment well Patient left: in chair;with call bell/phone within reach;with chair alarm set Nurse Communication: Mobility status;Precautions         Time: [id number] PT Time Calculation (min) (ACUTE ONLY): 23 min   Charges:   PT Evaluation $PT Eval Moderate Complexity: 1 Procedure PT Treatments $Therapeutic Activity: 8-22 mins   PT G Codes:        Olina Melfi B Trellis Vanoverbeke 2016-11-22, 11:04 AM Elwyn Reach, [place]

## 2016-11-03 NOTE — Progress Notes (Signed)
Family Medicine Teaching Service Daily Progress Note Intern Pager: [phone]  Patient name: Evan Charles Medical record number: [id number] Date of birth: 1946/09/29 Age: 70 y.o. Gender: male  Primary Care Provider: PROVIDER NOT [place]  Consultants: none, consider psych Code Status: full per prior code status  Pt Overview and Major Events to Date:  12/22: AMS, found down on floor covered in feces/urine. Head CT neg. [id number]  Assessment and Plan: Evan Charles is a 70 y.o. male presenting with confusion after being found down at home. PMH is significant for GERD, hypertension, unspecified mental illness, prostate CA, neuropathy, GERD and chronic back pain.  Rhabdomyolysis: CK elevated to 1480 but renal function WNL (Cr 0.84) with K of 3.6 on admission likely 2/2 to being down for extended period of time. Denies myalgias on admit. No evidence of compartment syndrome on exam. s/p 2L NS bolus on admission. - CK still elevated to 1,108 this AM - give an additional 1L NS bolus as CK not significantly improved. - repeat CK this evening and tomorrow in AM; may need additional fluid - increase MIVF to 150 -  U/A to evaluate for myoglobinuria > still not obtained, RN to get this AM. - monitor UOP   Altered mental status: improved but still oftentimes incoherent with answering questions. Possibly due to delirium superimposed on dementia. Also possibly secondary to dehydration in setting of fall. No leukocytosis or urinary symptoms, however would like to r/o urinary source with U/A.  Patient is confused with unknown baseline function (per [place], patient's cognition is very variable). Currently is not oriented to time or self with tangential speech, and endorses occasional auditory and visual hallucinations. Home medication include seroquel but EKG on 10/24/16 shows prolonged QTc (slightly improved to 495 this hospitalization).  - continue home seroquel for now.  - seroquel  can cause rhabdomyolysis, can consider discontinuation if slow improvement in CK. - holding home tramadol.  - F/u UA.  - given patient's poor living conditions and HH concerns, will consider obtaining a psychiatry consult once the patient is more cleared to assess for capacity and assist with dementia with mild psychosis.   Generalized weakness, h/o multiple falls at home. CT head neg. Patient had recent admission with similar presentation on 09/07/16, was discharged to SNF but after returning home has had progressive decline. Suspect will need higher level of care since patient lives at home alone and has no familial support. - PT/OT consult - social work consult   Left wrist contracture: concern by EDP about mononeuropathy due to immobility, however this was noted on ED visit on 12/12, at which time he was placed in a volar splint and was advised to f/u with ortho.  X-ray at that time notes the 2nd and 3rd MCPs looked unusual, however felt it was secondary to positional and degenerative changes. No tenderness on my exam.  - consider f/u with ortho as an outpatient.   H/o HTN. BPs soft, 108/57 this AM off antihypertensives.  - holding home medications - monitor BP  GERD - continue home protonix  H/o prostate CA s/p prostatectomy. Patient denies any urinary complaints currently.  FEN/GI: Regular diet, protonix, colace Prophylaxis: lovenox  Disposition: pending improvement in mentation, clearing of CK, PT/OT recs  Subjective:  Patient notes he's doing well. Notes today that he's here due to "swelling in his legs."  Denies SOB and chest pain.   Objective: Temp:  [97.9 F (36.6 C)-98.8 F (37.1 C)] 98.4 F (36.9  C) (12/23 0553) Pulse Rate:  [81-106] 94 (12/23 0553) Resp:  [13-19] 18 (12/23 0553) BP: (101-188)/(57-154) 108/57 (12/23 0553) SpO2:  [97 %-100 %] 97 % (12/23 0553) Weight:  [188 lb (85.3 kg)] 188 lb (85.3 kg) (12/22 1211) Physical Exam: General: Lying in bed in  NAD. Non-toxic Eyes: Conjunctivae non-injected.  ENTM: Moist mucous membranes. Oropharynx clear. No nasal discharge.  Neck: Supple, no LAD, no JVD.  Cardiovascular: RRR. No murmurs, rubs, or gallops noted. 1+ pitting edema noted. Respiratory: No increased WOB. CTAB without wheezing, rhonchi, or crackles noted over the anterior chest wall. Abdomen: +BS, soft, non-distended, non-tender.  MSK: Normal bulk and noted. Left wrist in splint, no tenderness to palpation. Skin: No rashes noted over exposed skin.  Neuro: alert, oriented to person, place, month, holiday. Year 2009. Here due to "swelling in legs."  Speech clear. Thought content tangential. EOMI, Uvula and tongue midline. Facial movements symmetric. 5/5 strength in the upper extremities and lower extremities bilaterally. Sensation intact bilaterally grossly.  Psych:  Intermittently confused, does not appear to be internally distracted.    Laboratory:  Recent Labs Lab 11/02/16 1255  WBC 6.7  HGB 12.2*  HCT 38.0*  PLT 311    Recent Labs Lab 11/02/16 1255 11/03/16 0647  NA 139 140  K 3.6 3.4*  CL 105 109  CO2 24 29  BUN 15 13  CREATININE 0.84 1.04  CALCIUM 9.0 8.3*  PROT 6.4*  --   BILITOT 1.0  --   ALKPHOS 48  --   ALT 20  --   AST 42*  --   GLUCOSE 82 103*   CK 1480 > 1,108 EtOH <5 UDS neg  Imaging/Diagnostic Tests: Ct Head Wo Contrast  Result Date: 11/02/2016 CLINICAL DATA:  Dementia, found on floor today, altered mental status, headache, recent falls, hypertension, history prostate cancer EXAM: CT HEAD WITHOUT CONTRAST TECHNIQUE: Contiguous axial images were obtained from the base of the skull through the vertex without intravenous contrast. Sagittal and coronal MPR images reconstructed from axial data set. Scattered mild motion artifacts present. COMPARISON:  10/23/2016 FINDINGS: Brain: Generalized atrophy. Normal ventricular morphology. No midline shift or mass effect. Small vessel chronic ischemic changes of  deep cerebral white matter. No intracranial hemorrhage, mass lesion, evidence of acute infarction, or extra-axial fluid collection. Dense calcification within falx. Vascular: Unremarkable Skull: Intact within limits of motion Sinuses/Orbits: Grossly clear within limits of motion Other: N/A IMPRESSION: Atrophy with small vessel chronic ischemic changes of deep cerebral white matter. No acute intracranial abnormalities. Electronically Signed   By: Lavonia Dana M.D.   On: 11/02/2016 13:34   Dg Chest Portable 1 View  Result Date: 11/02/2016 CLINICAL DATA:  Altered mental status and weakness. EXAM: PORTABLE CHEST 1 VIEW COMPARISON:  10/23/2016 FINDINGS: Chronic elevation left hemidiaphragm with left lower lobe scarring, unchanged from prior studies. Right lung is clear. Negative for heart failure or pneumonia. No pleural effusion IMPRESSION: Chronic changes in the left lung base. No acute cardiopulmonary abnormality. Electronically Signed   By: Franchot Gallo M.D.   On: 11/02/2016 13:05    Archie Patten, MD 11/03/2016, 8:22 AM PGY-3, [place] Intern pager: [phone], text pages welcome

## 2016-11-04 DIAGNOSIS — R627 Adult failure to thrive: Secondary | ICD-10-CM

## 2016-11-04 DIAGNOSIS — R262 Difficulty in walking, not elsewhere classified: Secondary | ICD-10-CM

## 2016-11-04 DIAGNOSIS — M6282 Rhabdomyolysis: Principal | ICD-10-CM

## 2016-11-04 LAB — BASIC METABOLIC PANEL
ANION GAP: 6 (ref 5–15)
BUN: 8 mg/dL (ref 6–20)
CALCIUM: 8.6 mg/dL — AB (ref 8.9–10.3)
CO2: 24 mmol/L (ref 22–32)
Chloride: 111 mmol/L (ref 101–111)
Creatinine, Ser: 0.89 mg/dL (ref 0.61–1.24)
GFR calc Af Amer: >60 mL/min (ref >60)
GLUCOSE: 111 mg/dL — AB (ref 65–99)
Potassium: 3.4 mmol/L — ABNORMAL LOW (ref 3.5–5.1)
Sodium: 141 mmol/L (ref 135–145)

## 2016-11-04 LAB — CK: CK TOTAL: 883 U/L — AB (ref 49–397)

## 2016-11-04 NOTE — Discharge Summary (Signed)
[facility] Discharge Summary  Patient name: Evan Charles Medical record number: [id number] Date of birth: 08/18/1946 Age: 70 y.o. Gender: male Date of Admission: 11/02/2016  Date of Discharge: 11/09/16  Admitting Physician: Kinnie Feil, MD  Primary Care Provider: PROVIDER NOT IN [facility] Consultants: psych  Indication for Hospitalization: AMS, CK 1500  Discharge Diagnoses/Problem List:  Rhabdomyolisis, resolving  Disposition: Home   Discharge Condition: improved  Discharge Exam:  General: Lying in bed in NAD. Cardiovascular: RRR. No murmurs, rubs, or gallops noted. No LE edema today Respiratory: No increased WOB. CTAB without wheezing, rhonchi, or crackles noted over the anterior chest wall. Abdomen: +BS, soft, non-distended, non-tender.  MSK: Left wrist in splint, no tenderness to palpation. Skin: No rashes noted over exposed skin.  Neuro: AOx3. Facial movements symmetric. Psych:  Mood stable during my exam  Brief Hospital Course:  Patient presented with confusion after being found down at home. Per EMS, patient was found on kitchen floor for unknown amount of time, altered mental status, covered in feces and urine. House was reportedly in bad condition, patient lives alone and per home health has had severe decline over the past few weeks after leaving SNF. In the ED, there was concern for mononeuropathy of the L wrist as he was unable to extend it. A splint was placed on it by ortho. CT head was negative, however he was found to have a CK of 1480. All other electrolytes were normal. It was not felt the patient was safe for discharge and FPTS was asked to admit. [facility] had placed a complaint with Adult Protective Services prior to admission because they feel patient is not safe at home. Reportedly two family members check on him daily, this does not seem to be the case. UDS negative. Patient was admitted, given 2L NS bolus and MIVF at 100cc/hr,  subsequently increased to 150cc/hr and given additional 1.5L NS bolus. UA without concerns for myoglobinuria. CK trended down (1480>1108>1057>883>530>312). Patient with variable mental status, and no family around to give baseline. Given state of home environment, psychiatric assessment was conducted and patient was deemed to lack capacity to make medical decisions. CSW and CM were able to secure additional LOG for SNF given home environment. At time of discharge, patient was tolerating PO.   Issues for Follow Up:  1. Tramadol likely precipitated a delirious episode for this patient. DO NOT continue.  2. Notably, patient does not have SNF or long term care coverage through the [place]. 3. If patient leaves/is discharged from SNF, [facility] needs to be notified that he returned to his home. Reported concerns that he is not safe there.  Needs HHPT, RN when discharged from SNF. HHRN at minimum given needs assessment for home safety.  Significant Procedures: none  Significant Labs and Imaging:   Recent Labs Lab 11/07/16 0705  WBC 6.1  HGB 12.1*  HCT 37.7*  PLT 309    Recent Labs Lab 11/04/16 0411 11/05/16 0856 11/06/16 0719 11/07/16 0705 11/08/16 0512  NA 141 139 139 139 136  K 3.4* 3.1* 3.7 3.7 4.0  CL 111 105 105 104 104  CO2 24 25 26 29 29   GLUCOSE 111* 104* 104* 99 101*  BUN 8 <5* 11 12 11   CREATININE 0.89 0.80 1.03 0.97 0.88  CALCIUM 8.6* 8.7* 9.0 8.9 8.6*  MG  --  1.7  --  1.8 2.1    CK as above.  Results/Tests Pending at Time of Discharge: none  Discharge Medications:  Allergies as of 11/09/2016      Reactions   Tomato Itching, Rash      Medication List    STOP taking these medications   traMADol 50 MG tablet Commonly known as:  ULTRAM     TAKE these medications   amLODipine 5 MG tablet Commonly known as:  NORVASC Take 1 tablet (5 mg total) by mouth daily.   docusate sodium 100 MG capsule Commonly known as:  COLACE Take 1 capsule (100 mg total) by  mouth 2 (two) times daily. What changed:  when to take this  reasons to take this   hydrochlorothiazide 12.5 MG capsule Commonly known as:  MICROZIDE Take 12.5 mg by mouth daily.   pantoprazole 40 MG tablet Commonly known as:  PROTONIX Take 1 tablet (40 mg total) by mouth 2 (two) times daily.   QUEtiapine 25 MG tablet Commonly known as:  SEROQUEL Take 1 tablet (25 mg total) by mouth at bedtime.       Discharge Instructions: Please refer to Patient Instructions section of EMR for full details.  Patient was counseled important signs and symptoms that should prompt return to medical care, changes in medications, dietary instructions, activity restrictions, and follow up appointments.   Follow-Up Appointments: Follow-up Information    [facility]. Schedule an appointment as soon as possible for a visit.   Why:  1 week after discharge from SNF          Lovenia Kim, MD 11/09/2016, 1:26 PM PGY-1, [place]

## 2016-11-05 DIAGNOSIS — F0390 Unspecified dementia without behavioral disturbance: Secondary | ICD-10-CM

## 2016-11-05 DIAGNOSIS — Z87891 Personal history of nicotine dependence: Secondary | ICD-10-CM

## 2016-11-05 DIAGNOSIS — Z91018 Allergy to other foods: Secondary | ICD-10-CM

## 2016-11-05 DIAGNOSIS — R262 Difficulty in walking, not elsewhere classified: Secondary | ICD-10-CM

## 2016-11-05 DIAGNOSIS — R410 Disorientation, unspecified: Secondary | ICD-10-CM

## 2016-11-05 DIAGNOSIS — Z808 Family history of malignant neoplasm of other organs or systems: Secondary | ICD-10-CM

## 2016-11-05 DIAGNOSIS — E876 Hypokalemia: Secondary | ICD-10-CM

## 2016-11-05 LAB — BASIC METABOLIC PANEL
Anion gap: 9 (ref 5–15)
CALCIUM: 8.7 mg/dL — AB (ref 8.9–10.3)
CHLORIDE: 105 mmol/L (ref 101–111)
CO2: 25 mmol/L (ref 22–32)
CREATININE: 0.8 mg/dL (ref 0.61–1.24)
Glucose, Bld: 104 mg/dL — ABNORMAL HIGH (ref 65–99)
Potassium: 3.1 mmol/L — ABNORMAL LOW (ref 3.5–5.1)
SODIUM: 139 mmol/L (ref 135–145)

## 2016-11-05 LAB — MAGNESIUM: MAGNESIUM: 1.7 mg/dL (ref 1.7–2.4)

## 2016-11-05 LAB — CK: CK TOTAL: 530 U/L — AB (ref 49–397)

## 2016-11-05 MED ORDER — HYDRALAZINE HCL 20 MG/ML IJ SOLN
10.0000 mg | INTRAMUSCULAR | Status: DC | PRN
  Administered 2016-11-05: 10 mg via INTRAVENOUS
  Filled 2016-11-05: qty 1

## 2016-11-05 MED ORDER — POTASSIUM CHLORIDE CRYS ER 20 MEQ PO TBCR
40.0000 meq | EXTENDED_RELEASE_TABLET | Freq: Two times a day (BID) | ORAL | Status: DC
  Administered 2016-11-05: 40 meq via ORAL
  Filled 2016-11-05 (×2): qty 2

## 2016-11-05 MED ORDER — HYDROCHLOROTHIAZIDE 12.5 MG PO CAPS
12.5000 mg | ORAL_CAPSULE | Freq: Every day | ORAL | Status: DC
  Administered 2016-11-06 – 2016-11-09 (×3): 12.5 mg via ORAL
  Filled 2016-11-05 (×4): qty 1

## 2016-11-05 NOTE — Progress Notes (Signed)
Labetalol was given this morning and then discontinued. It was replaced with bisoprolol. Called pharmacy to reschedule. Moved to 1500. Joslyn Hy, MSN, RN, [employer]

## 2016-11-05 NOTE — Consult Note (Addendum)
[facility] Psychiatry Consult   Reason for Consult:  Medication recommendation, capacity evaluation to be discharged to home Referring Physician:  Dr. Ronnie Doss Patient Identification: Evan Charles MRN:  [id number] Principal Diagnosis: Acute delirium Diagnosis:   Patient Active Problem List   Diagnosis Date Noted  . Hypokalemia [E87.6] 11/05/2016  . Difficulty in walking, not elsewhere classified [R26.2]   . Dementia [F03.90] 09/12/2016  . Acute delirium [R41.0] 09/08/2016  . Rhabdomyolysis [M62.82] 09/08/2016  . Normocytic anemia [D64.9] 09/08/2016  . Altered mental status [R41.82] 09/08/2016  . Fall at home [W19.XXXA, Y92.099]   . UTI (lower urinary tract infection) [N39.0] 07/25/2016  . Falls [phone]] 07/25/2016  . Mild cognitive impairment [G31.84] 07/25/2016  . Elevated CK [R74.8] 07/25/2016  . 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 Immobility [Z74.09] 03/20/2016  . Generalized weakness [R53.1] 03/20/2016  . Failure to thrive in adult [R62.7] 03/20/2016  . Neuropathy ([place]) [G62.9]   . Essential hypertension [I10]   . Physical deconditioning [R53.81]   . 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 GERD without esophagitis [K21.9] 12/19/2015  . Bilateral lower extremity edema [R60.0] 12/19/2015  . Intractable low back pain [M54.5] 02/14/2015  . Difficulty walking [R26.2] 02/14/2015  . Prostate cancer ([place]) [C61] 02/14/2015  . H/O: upper GI bleed [Z87.19]   . Chronic back pain [M54.9, G89.29]   . Visual hallucinations [R44.1]   . 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 male patient with unspecified dementia, hypertension, chronic back pain, who was admitted after being found down at home, treated for rhabdomyolysis. 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  - Per chart review, patient had three admissions since this September due to fall and altered mental status. Patient was discharged to skilled nursing facility but signed himself out several weeks ago. "He is been at home where he lives by himself and having home health care form twice a week. Family has been coming over daily." - Per admission note, "While he was trying to get on his chair at home, the chair gave way and her landed on the floor. He stated he landed on his back and hit the back of his head on the floor"  - Per nursing report, patient has been confused since this morning. He was combative at times and tried to swing to his nurse. Unknown sleep time. Patient was alert and oriented yesterday.  - Per team, patient was evaluated by PT/OT and both recommended SNF.  Patient is a very poor historian.  He was mumbling words before this Probation officer coming in to his room. He tries to reach his hands (with mittens) to this interviewer. He continues to mumble some words. He is unable to follow instructions.   Past Psychiatric History: (per chart review) Outpatient:  Per chart, "He was taken medication treatment from [facility] in the past" Psychiatry admission: [facility] and [place] for psychosis in 2016 per chart Previous suicide attempt: unknown Past trials of medication: unknown History of violence: unknown  Risk to Self: Is patient at risk for suicide?: No Risk to Others:   Prior Inpatient Therapy:   Prior Outpatient Therapy:    Past Medical History:  Past Medical History:  Diagnosis Date  . Back pain   . Chronic mental illness   .  Colitis   . Dementia   . GERD (gastroesophageal reflux disease)   . Hypertension   . Neuropathy ([place])    " MY HANDS "  . Prostate cancer [facility])     Past Surgical History:  Procedure Laterality Date  . KNEE ARTHROSCOPY    . LAMINECTOMY    . PROSTATECTOMY     Family History:  Family  History  Problem Relation Age of Onset  . Cancer Brother   . Cancer Maternal Grandmother    Family Psychiatric  History: mother- dementia (at unknown age) (per chart review) Social History:  History  Alcohol Use No     History  Drug Use No    Social History   Social History  . Marital status: Single    Spouse name: N/A  . Number of children: N/A  . Years of education: N/A   Occupational History  . Retired    Social History Main Topics  . Smoking status: Former Smoker    Types: Cigarettes  . Smokeless tobacco: Never Used  . Alcohol use No  . Drug use: No  . Sexual activity: Not Asked   Other Topics Concern  . None   Social History Narrative  . None   Additional Social History:   Unable to obtain due to patient current mental status Allergies:   Allergies  Allergen Reactions  . Tomato Itching and Rash    Labs:  Results for orders placed or performed during the hospital encounter of 11/02/16 (from the past 48 hour(s))  CK     Status: Abnormal   Collection Time: 11/03/16  6:24 PM  Result Value Ref Range   Total CK 1,057 (H) 49 - 397 U/L  CK     Status: Abnormal   Collection Time: 11/04/16  4:11 AM  Result Value Ref Range   Total CK 883 (H) 49 - 397 U/L  Basic metabolic panel     Status: Abnormal   Collection Time: 11/04/16  4:11 AM  Result Value Ref Range   Sodium 141 135 - 145 mmol/L   Potassium 3.4 (L) 3.5 - 5.1 mmol/L   Chloride 111 101 - 111 mmol/L   CO2 24 22 - 32 mmol/L   Glucose, Bld 111 (H) 65 - 99 mg/dL   BUN 8 6 - 20 mg/dL   Creatinine, Ser 0.89 0.61 - 1.24 mg/dL   Calcium 8.6 (L) 8.9 - 10.3 mg/dL   GFR calc non Af Amer >60 >60 mL/min   GFR calc Af Amer >60 >60 mL/min    Comment: (NOTE) The eGFR has been calculated using the CKD EPI equation. This calculation has not been validated in all clinical situations. eGFR's persistently <60 mL/min signify possible Chronic Kidney Disease.    Anion gap 6 5 - 15  CK     Status: Abnormal    Collection Time: 11/05/16  8:56 AM  Result Value Ref Range   Total CK 530 (H) 49 - 397 U/L  Basic metabolic panel     Status: Abnormal   Collection Time: 11/05/16  8:56 AM  Result Value Ref Range   Sodium 139 135 - 145 mmol/L   Potassium 3.1 (L) 3.5 - 5.1 mmol/L   Chloride 105 101 - 111 mmol/L   CO2 25 22 - 32 mmol/L   Glucose, Bld 104 (H) 65 - 99 mg/dL   BUN <5 (L) 6 - 20 mg/dL   Creatinine, Ser 0.80 0.61 - 1.24 mg/dL   Calcium 8.7 (L) 8.9 -  10.3 mg/dL   GFR calc non Af Amer >60 >60 mL/min   GFR calc Af Amer >60 >60 mL/min    Comment: (NOTE) The eGFR has been calculated using the CKD EPI equation. This calculation has not been validated in all clinical situations. eGFR's persistently <60 mL/min signify possible Chronic Kidney Disease.    Anion gap 9 5 - 15  Magnesium     Status: None   Collection Time: 11/05/16  8:56 AM  Result Value Ref Range   Magnesium 1.7 1.7 - 2.4 mg/dL    Current Facility-Administered Medications  Medication Dose Route Frequency Provider Last Rate Last Dose  . acetaminophen (TYLENOL) tablet 650 mg  650 mg Oral Q6H PRN Marjie Skiff, MD   650 mg at 11/03/16 2210  . docusate sodium (COLACE) capsule 100 mg  100 mg Oral BID PRN Bufford Lope, DO      . enoxaparin (LOVENOX) injection 40 mg  40 mg Subcutaneous Q24H Bufford Lope, DO   40 mg at 11/04/16 2046  . hydrochlorothiazide (MICROZIDE) capsule 12.5 mg  12.5 mg Oral Daily Ashly M Gottschalk, DO      . pantoprazole (PROTONIX) EC tablet 40 mg  40 mg Oral BID Bufford Lope, DO   40 mg at 11/04/16 2047  . potassium chloride SA (K-DUR,KLOR-CON) CR tablet 40 mEq  40 mEq Oral BID Ashly M Gottschalk, DO      . QUEtiapine (SEROQUEL) tablet 25 mg  25 mg Oral QHS Bufford Lope, DO   25 mg at 11/04/16 2047   Head CT 10/23/2016 FINDINGS: Brain: Generalized atrophy. Normal ventricular morphology. No midline shift or mass effect. Small vessel chronic ischemic changes of deep cerebral white matter. No intracranial  hemorrhage, mass lesion, evidence of acute infarction, or extra-axial fluid collection. Dense calcification within falx.  Musculoskeletal: Strength & Muscle Tone: decreased Gait & Station: unable to assess, patient lying in the bed Patient leans: Backward  Psychiatric Specialty Exam: Physical Exam  Review of Systems  Unable to perform ROS: Acuity of condition  All other systems reviewed and are negative.   Blood pressure (!) 156/93, pulse 93, temperature 98 F (36.7 C), temperature source Axillary, resp. rate 17, height _0  (1.753 m), weight 188 lb (85.3 kg), SpO2 97 %.Body mass index is 27.76 kg/m.  General Appearance: Disheveled  Eye Contact:  Poor  Speech:  Garbled  Volume:  Decreased  Mood:  "..."  Affect:  Blunt  Thought Process:  Disorganized Perceptions: unable to assess due to his current mental state. No responding to internal stimuli  Orientation:  Other:  unable to assess due to his current mental state  Thought Content:  unable to assess due to his current mental state  Suicidal Thoughts:  unable to assess due to his current mental state  Homicidal Thoughts:  unable to assess due to his current mental state  Memory:  unable to assess due to his current mental state  Judgement:  Impaired  Insight:  Lacking  Psychomotor Activity:  Decreased  Concentration:  Concentration: Poor and Attention Span: Poor  Recall:  unable to assess due to his current mental state  Fund of Knowledge:  unable to assess due to his current mental state  Language:  unable to assess due to his current mental state  Akathisia:  unable to assess due to his current mental state  Handed:    AIMS (if indicated):     Assets:  Social Support  ADL's:  Impaired  Cognition:  Impaired,  Moderate  Sleep:   unknown   Assessment Evan Charles is a 70 y.o. male patient with unspecified neurocognitive disorder, hypertension, chronic back pain, who was admitted after being found down at home,  treated for rhabdomyolysis. Patient had similar presentation three times since September. 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   # Delirium # Unspecified neurocognitive disorder Today's exam is notable for his altered sensorium and disorientation. Although we cannot rule out BPSD, his mental status fluctuates in a day per collaterals; it is more consistent with delirium. He is at risk of delirium given dementia, pain and possible dehydration. Noted that patient does have history of hallucinations since 2015 based on chart review. It is atypical presentation for people to present with primary psychotic disorder at his age; it is likely secondary to his dementia. Given these, he would benefit from having low dose quetiapine (given concern for Lewy body dementia). Will have prn quetiapine for insomnia and agitation if his behavior is not redirected with non pharmacologic approach such as reassurance.Would recommend delirium precautions a below.    # Capacity evaluation to be discharged to home.  At the present time, the psychiatry consultation service believes the patient does not have decisional capacity with respect to discharged to home. Criteria for decision making capacity requires patient be able to: (1) communicate a choice in a clear and consistent manner, (2) demonstrate adequate understanding of disclosed relevant information regarding her/his medical condition and treatment, possible benefits and risks of that treatment, and alternative approaches, (3) describe views of her/his medical condition, proposed treatment and its consequences, and (4) engage in a rational process of manipulating the relevant information to reach her/his decision. Based on our examination, the patient does not meet those criteria.   If patient has no significant  improvement in his mental status, a substitute decision maker must be sought to authorize medical intervention or to refuse treatment on behalf of the patient; for patients with advance directives, either the treatment choice that the patient made in advance or the choice of a surrogate decision maker may be indicated. In the absence of an advance directive and when time is available, the recommendation is usually to contact family members (the priority order to be approached is the spouse, adult children, parents, siblings, and other relatives).   Recommendation - Continue quetiapine 25 mg qhs - Start quetiapine 25 mg q8hprn for insomnia, agitation - Monitor EKG. If QTc> 500 msec, hold quetiapine and try Trazodone 25 mg q8hprn for insomnia, agitation (Last QTc 495 msec on 12/23.)  - Keep K>4.0, Mg>2.0 to avoid Torsades de points.  - Continue to monitor and treat underlying medical causes of delirium, including infection, electrolyte disturbances, etc. - Delirium precautions - Minimize/avoid deliriogenic meds including: anticholinergic, opiates, benzodiazepines           - Maintain hydration, oxygenation, nutrition           - Limit use of restraints and catheters           - Normalize sleep patterns by minimizing nighttime noise, light and interruptions by                clustering care, opening blinds during the day           - Reorient the patient frequently, provide easily visible clock and calendar           -  Provide sensory aids like glasses, hearing aids           - Encourage ambulation, regular activities and visitors to maintain cognitive stimulation  - Patient does not have capacity to be discharged to home on today's evaluation. It should be emphasized however, that capacity may need to be re-assessed, as the patient's mental status changes over time. Consider re-consulting psychiatry when there is some improvement in his mental status, such as maintaining alertness and orientation more  than two days. Also, decisional capacity for other, specific treatment/disposition decisions will need to be evaluated on an individual basis.   Treatment Plan Summary: Plan as above  Disposition: No evidence of imminent risk to self or others at present.    Thank you for your consult. We will sign off. Please contact psychiatry for any questions or concerns.  Norman Clay, MD 11/05/2016 2:43 PM

## 2016-11-05 NOTE — Progress Notes (Signed)
Family Medicine Teaching Service Daily Progress Note Intern Pager: [phone]  Patient name: Evan Charles Medical record number: [id number] Date of birth: 02/27/1946 Age: 70 y.o. Gender: male  Primary Care Provider: PROVIDER NOT [place]  Consultants: none, consider psych Code Status: full per prior code status  Pt Overview and Major Events to Date:  12/22: AMS, found down on floor covered in feces/urine. Head CT neg. [id number]  Assessment and Plan: GERARD SUDDITH is a 70 y.o. male presenting with confusion after being found down at home. PMH is significant for GERD, hypertension, unspecified mental illness, prostate CA, neuropathy, GERD and chronic back pain.  Rhabdomyolysis: CK 530 this am, continues to down trend.  -4.6L UOP/ 24 h - NS running at 100cc/hr.  Will dc fluids, given down trending CK, good PO intake and trace LE edema. - trend CK  Back pain: Denies back pain this morning.  Reports to one examiner that he fell on his back, although story is variable between examiners.  - consider imaging to evaluate possibility of compression fracture if not improving - Kpad and tylenol   Altered mental status: improved but still oftentimes incoherent with answering questions. Possibly due to delirium superimposed on dementia. Also possibly secondary to dehydration in setting of fall. No leukocytosis or urinary symptoms, and UA without signs of infection. Patient is confused with unknown baseline function (per [place], patient's cognition is very variable). Currently is not oriented to time or self with tangential speech, and endorses occasional auditory and visual hallucinations. Home medication include seroquel but EKG on 10/24/16 shows prolonged QTc (slightly improved to 495 this hospitalization).  - continue home seroquel for now - seroquel can cause rhabdomyolysis; however, CK improving, so will continue - holding home tramadol.  - given patient's poor living  conditions and HH concerns, need to assess for capacity and assist with dementia with mild psychosis.   - Spoke to bedside RN this am.  Patient possibly combative this morning with RN, will continue to take measures to reduce risk for delirium as able.  Though difficult to tell if delirium vs other underlying psychiatric issue.  Patient non-combative or aggressive during my exam. - c/s psych placed 12/25.   - APS case open, per RN's report, as well  Hypokalemia: K 3.1 - KDur 84meq BID x2 doses ordered - Check magnesium - BMP in am  Generalized weakness, h/o multiple falls at home. CT head neg. Patient had recent admission with similar presentation on 09/07/16, was discharged to SNF but after returning home has had progressive decline. Suspect will need higher level of care since patient lives at home alone and has no familial support. - PT/OT consult; recommends - social work consult   Left wrist contracture: concern by EDP about mononeuropathy due to immobility, however this was noted on ED visit on 12/12, at which time he was placed in a volar splint and was advised to f/u with ortho.  X-ray at that time notes the 2nd and 3rd MCPs looked unusual, however felt it was secondary to positional and degenerative changes - consider f/u with ortho as an outpatient.   H/o HTN. BPs 150s/80s this AM off antihypertensives. On norvasc and HCTZ at home. - Given LE edema, will start HCTZ back.  Watch potassium, as has been low over the last few days. - Consider restarting Norvasc once LE edema resolves. - monitor BP  GERD - continue home protonix  H/o prostate CA s/p prostatectomy. Patient denies any urinary  complaints currently.  FEN/GI: Regular diet, protonix, colace Prophylaxis: lovenox  Disposition: pending improvement in mentation, clearing of CK, PT/OT recs  Subjective:  Patient oriented x3 but is nonsensical during exam.  He can be intermittently redirected.  Objective: Temp:   [97.3 F (36.3 C)-98 F (36.7 C)] 98 F (36.7 C) (12/25 0552) Pulse Rate:  [79-101] 93 (12/25 0552) Resp:  [17-18] 17 (12/25 0552) BP: (152-156)/(81-93) 156/93 (12/25 [id number]) Physical Exam: General: Lying in bed in NAD. Cardiovascular: RRR. No murmurs, rubs, or gallops noted. 1+ pitting LE to shins edema noted. Respiratory: No increased WOB. CTAB without wheezing, rhonchi, or crackles noted over the anterior chest wall. Abdomen: +BS, soft, non-distended, non-tender.  MSK: Left wrist in splint, no tenderness to palpation. Skin: No rashes noted over exposed skin.  Neuro: AOx3. PERRL, EOMI,  Facial movements symmetric. 5/5 strength in the upper extremities and lower extremities bilaterally. Psych:  Mood stable during my exam, Intermittently confused.  Nonsensical speech at times.  Requires redirection intermittently.  Thought content tangential.   Intake/Output Summary (Last 24 hours) at 11/05/16 1039 Last data filed at 11/05/16 0618  Gross per 24 hour  Intake          1019.17 ml  Output             3575 ml  Net         -2555.83 ml   Laboratory:  Recent Labs Lab 11/02/16 1255  WBC 6.7  HGB 12.2*  HCT 38.0*  PLT 311    Recent Labs Lab 11/02/16 1255 11/03/16 0647 11/04/16 0411 11/05/16 0856  NA 139 140 141 139  K 3.6 3.4* 3.4* 3.1*  CL 105 109 111 105  CO2 24 29 24 25   BUN 15 13 8  <5*  CREATININE 0.84 1.04 0.89 0.80  CALCIUM 9.0 8.3* 8.6* 8.7*  PROT 6.4*  --   --   --   BILITOT 1.0  --   --   --   ALKPHOS 48  --   --   --   ALT 20  --   --   --   AST 42*  --   --   --   GLUCOSE 82 103* 111* 104*   CK 1480 >> 530 EtOH <5 UDS neg  Imaging/Diagnostic Tests: No results found.  Janora Norlander, DO 11/05/2016, 10:38 AM PGY-3, [place] Intern pager: [phone], text pages welcome

## 2016-11-05 NOTE — Progress Notes (Signed)
Patient verbally and physically aggressive this morning. Patient is not alert and oriented to place, time, or situation. Patient tried to punch this nurse while trying to do physical assessment of listening to chest.Notified MD, no new orders, but MD stated they would come and assess. Joslyn Hy, MSN, RN, [employer]

## 2016-11-06 DIAGNOSIS — R9431 Abnormal electrocardiogram [ECG] [EKG]: Secondary | ICD-10-CM

## 2016-11-06 LAB — BASIC METABOLIC PANEL
Anion gap: 8 (ref 5–15)
BUN: 11 mg/dL (ref 6–20)
CHLORIDE: 105 mmol/L (ref 101–111)
CO2: 26 mmol/L (ref 22–32)
CREATININE: 1.03 mg/dL (ref 0.61–1.24)
Calcium: 9 mg/dL (ref 8.9–10.3)
Glucose, Bld: 104 mg/dL — ABNORMAL HIGH (ref 65–99)
Potassium: 3.7 mmol/L (ref 3.5–5.1)
SODIUM: 139 mmol/L (ref 135–145)

## 2016-11-06 LAB — TROPONIN I

## 2016-11-06 LAB — CK: CK TOTAL: 312 U/L (ref 49–397)

## 2016-11-06 MED ORDER — POTASSIUM CHLORIDE CRYS ER 20 MEQ PO TBCR
30.0000 meq | EXTENDED_RELEASE_TABLET | Freq: Once | ORAL | Status: AC
  Administered 2016-11-06: 11:00:00 30 meq via ORAL
  Filled 2016-11-06: qty 1

## 2016-11-06 NOTE — Progress Notes (Signed)
Abnormal EKG. MD made aware.

## 2016-11-06 NOTE — Evaluation (Signed)
Occupational Therapy Evaluation Patient Details Name: Evan Charles MRN: [id number] DOB: Aug 21, 1946 Today's Date: 11/06/2016    History of Present Illness 70yo admitted after found down at home with rhabdomyolysis. PMHx: mental illness, back pain, HTN, GERD, prostate CA, TKA Rt, neuropathy   Clinical Impression   Pt with decline in independence in ADL, please see ADL performance below. Pt currently set up with seated ADL, and impaired sensation and function in LUE. Pt with history of falls. Pt will benefit from skilled OT in the acute care setting prior to d/c home with [facility] services, although Pt would really benefit from SNF stay to maximize independence and safety education. Energy conservation education provided. Next session Pt really wants to shave.    Follow Up Recommendations  Home health OT;Supervision/Assistance - 24 hour; SNF    Equipment Recommendations  None recommended by OT    Recommendations for Other Services       Precautions / Restrictions Precautions Precautions: Fall Restrictions Weight Bearing Restrictions: No      Mobility Bed Mobility Overal bed mobility: Needs Assistance Bed Mobility: Supine to Sit     Supine to sit: HOB elevated;Mod assist     General bed mobility comments: verbal cues for sequence, use of rail and HHA to bring trunk to EOB  Transfers Overall transfer level: Needs assistance Equipment used: Rolling walker (2 wheeled) Transfers: Sit to/from Stand Sit to Stand: Max assist         General transfer comment: verbal cues for hand placement, heavy anterior lean, OT providing foot block. Pt's posterior lean improved with time    Balance Overall balance assessment: Needs assistance Sitting-balance support: No upper extremity supported;Feet supported Sitting balance-Leahy Scale: Poor Sitting balance - Comments: posterior lean when sitting EOB, Pt required physical cues to correct Postural control: Posterior lean Standing  balance support: Bilateral upper extremity supported Standing balance-Leahy Scale: Poor Standing balance comment: standing with RW, posterior lean (see transfer section)                            ADL Overall ADL's : Needs assistance/impaired     Grooming: Wash/dry hands;Oral care;Minimal assistance;Cueing for sequencing;Sitting Grooming Details (indicate cue type and reason): EOB Upper Body Bathing: Minimal assistance;Sitting   Lower Body Bathing: Maximal assistance;Sit to/from stand   Upper Body Dressing : Minimal assistance;Sitting   Lower Body Dressing: Maximal assistance;Sitting/lateral leans   Toilet Transfer: Maximal assistance;BSC;Stand-pivot;Cueing for sequencing;Cueing for safety;RW   Toileting- Clothing Manipulation and Hygiene: Total assistance;Sit to/from stand       Functional mobility during ADLs:  (not attempted this session, Pt with severe posterior lean)       Vision     Perception     Praxis      Pertinent Vitals/Pain Pain Assessment: No/denies pain     Hand Dominance Right   Extremity/Trunk Assessment Upper Extremity Assessment Upper Extremity Assessment: LUE deficits/detail LUE Deficits / Details: wirst in splint LUE: Unable to fully assess due to immobilization LUE Sensation:  (Pt states that it is numb) LUE Coordination: decreased fine motor   Lower Extremity Assessment Lower Extremity Assessment: Defer to PT evaluation   Cervical / Trunk Assessment Cervical / Trunk Assessment: Kyphotic (history of chronic back pain)   Communication Communication Communication: No difficulties   Cognition Arousal/Alertness: Awake/alert Behavior During Therapy: WFL for tasks assessed/performed Overall Cognitive Status: Impaired/Different from baseline Area of Impairment: Following commands;Awareness;Problem solving  Following Commands: Follows one step commands consistently   Awareness: Intellectual Problem Solving: Slow  processing;Difficulty sequencing;Requires tactile cues;Requires verbal cues General Comments: Pt needed step by step instructions for oral care, Pt able to speak coherently about time in [employer] and make jokes, but struggled to perform functional tasks   General Comments       Exercises       Shoulder Instructions      Home Living Family/patient expects to be discharged to:: Private residence Living Arrangements: Alone Available Help at Discharge: Family;Available PRN/intermittently Type of Home: House Home Access: Stairs to enter [employer] of Steps: 4 Entrance Stairs-Rails: Right;Left Home Layout: One level     Bathroom Shower/Tub: Tub/shower unit Shower/tub characteristics: Curtain Biochemist, clinical: Standard     Home Equipment: Environmental consultant - 2 wheels;Cane - single point;Bedside commode   Additional Comments: Home information is from physical therapy evaluation, Pt unreliable historian      Prior Functioning/Environment Level of Independence: Independent with assistive device(s)        Comments: pt states he uses RW, assist from nephew for bathing, meals on wheels        OT Problem List: Decreased activity tolerance;Impaired balance (sitting and/or standing);Decreased cognition;Decreased safety awareness;Decreased knowledge of use of DME or AE;Impaired UE functional use   OT Treatment/Interventions: Self-care/ADL training;Energy conservation;DME and/or AE instruction;Patient/family education;Balance training;Cognitive remediation/compensation    OT Goals(Current goals can be found in the care plan section) Acute Rehab OT Goals Patient Stated Goal: return home OT Goal Formulation: With patient Time For Goal Achievement: 11/13/16 Potential to Achieve Goals: Good ADL Goals Pt Will Perform Grooming: with modified independence;sitting Pt Will Transfer to Toilet: with modified independence;stand pivot transfer;bedside commode Pt Will Perform Toileting - Clothing  Manipulation and hygiene: with modified independence;sit to/from stand Additional ADL Goal #1: Pt will describe 3 ways to conserve energy during ADL  OT Frequency: Min 2X/week   Barriers to D/C: Decreased caregiver support (Pt lives alone)          Co-evaluation              End of Session Equipment Utilized During Treatment: Gait belt;Rolling walker Nurse Communication: Mobility status (Pt provided with 12 oz of apple juice)  Activity Tolerance: Patient tolerated treatment well Patient left: in bed;with call bell/phone within reach;with bed alarm set   Time: [id number] OT Time Calculation (min): 36 min Charges:  OT General Charges $OT Visit: 1 Procedure OT Evaluation $OT Eval Moderate Complexity: 1 Procedure OT Treatments $Self Care/Home Management : 8-22 mins G-Codes:    Merri Ray Karsin Pesta 11-21-16, 5:25 PM Hulda Humphrey OTR/L [phone]

## 2016-11-06 NOTE — Consult Note (Addendum)
CARDIOLOGY CONSULT NOTE   Patient ID: Evan Charles MRN: [id number] DOB/AGE: Jan 17, 1946 70 y.o.  Admit date: 11/02/2016  Primary Physician   PROVIDER NOT [place] Primary Cardiologist   New Reason for Consultation   Abnormal EKG Requesting Physician  Dr. Gwendlyn Deutscher  HPI: Evan Charles is a 70 y.o. male with a history of HTN, dementia, GERD, chronic mental illness (unspecified) and recent recurrent fall and rhabdomyolysis requiring admission who again presented with fall.   Last admission 09/07/16-09/13/16 for acute metabolic encephalopathy of unknown etiology. Found to have a rhabdomyolysis due to trauma (2669-->555).   The patient is somewhat confused today. Unable to specify why he fell prior to admission. States that he was on a floor and found able to get up. No syncope however. Her H& P "he was trying to get on his chair at home, the chair gave way and her landed on the floor". Had left hand injury. Upon arrival patient was confused with CK elevated to 1480. Normal serum creatinine and potassium. Symptoms and labs improved with hydration. Patient was seen by psychiatric due to prolonged QTC on psychiatric medication. Medication adjusted. This and has a chronic right bundle branch block however repeat EKG this morning showed LPFB. However, EKG does not look to have changes compared to admission EKG.  Patient denies any prior history of MI or cardiac disease. No family history of CAD per patient.  Past Medical History:  Diagnosis Date  . Back pain   . Chronic mental illness   . Colitis   . Dementia   . GERD (gastroesophageal reflux disease)   . Hypertension   . Neuropathy ([place])    " MY HANDS "  . Prostate cancer [facility])      Past Surgical History:  Procedure Laterality Date  . KNEE ARTHROSCOPY    . LAMINECTOMY    . PROSTATECTOMY      Allergies  Allergen Reactions  . Tomato Itching and Rash    I have reviewed the patient's current medications . enoxaparin (LOVENOX)  injection  40 mg Subcutaneous Q24H  . hydrochlorothiazide  12.5 mg Oral Daily  . pantoprazole  40 mg Oral BID  . QUEtiapine  25 mg Oral QHS    acetaminophen, docusate sodium, hydrALAZINE  Prior to Admission medications   Medication Sig Start Date End Date Taking? Authorizing Provider  amLODipine (NORVASC) 5 MG tablet Take 1 tablet (5 mg total) by mouth daily. 09/14/16  Yes Barton Dubois, MD  docusate sodium (COLACE) 100 MG capsule Take 1 capsule (100 mg total) by mouth 2 (two) times daily. Patient taking differently: Take 100 mg by mouth 2 (two) times daily as needed (constipation).  03/22/16  Yes Ripudeep Krystal Eaton, MD  hydrochlorothiazide (MICROZIDE) 12.5 MG capsule Take 12.5 mg by mouth daily. 08/21/16  Yes Historical Provider, MD  pantoprazole (PROTONIX) 40 MG tablet Take 1 tablet (40 mg total) by mouth 2 (two) times daily. 12/16/15  Yes Leo Grosser, MD  QUEtiapine (SEROQUEL) 25 MG tablet Take 1 tablet (25 mg total) by mouth at bedtime. 09/13/16  Yes Barton Dubois, MD  traMADol (ULTRAM) 50 MG tablet Take 1 tablet (50 mg total) by mouth every 8 (eight) hours as needed for severe pain. 09/13/16  Yes Barton Dubois, MD     Social History   Social History  . Marital status: Single    Spouse name: N/A  . Number of children: N/A  . Years of education: N/A   Occupational History  . Retired  Social History Main Topics  . Smoking status: Former Smoker    Types: Cigarettes  . Smokeless tobacco: Never Used  . Alcohol use No  . Drug use: No  . Sexual activity: Not on file   Other Topics Concern  . Not on file   Social History Narrative  . No narrative on file    Family Status  Relation Status  . Brother   . Maternal Grandmother    Family History  Problem Relation Age of Onset  . Cancer Brother   . Cancer Maternal Grandmother       ROS:  Full 14 point review of systems complete and found to be negative unless listed above.  Physical Exam: Blood pressure 115/70, pulse 87,  temperature 97.7 F (36.5 C), temperature source Axillary, resp. rate 20, height 5\' 9"  (1.753 m), weight 188 lb (85.3 kg), SpO2 94 %.  General: Well developed, well nourished, male in no acute distress Head: Eyes PERRLA, No xanthomas. Normocephalic and atraumatic, oropharynx without edema or exudate.  Lungs: Resp regular and unlabored, CTA. Heart: RRR no s3, s4, or murmurs..   Neck: No carotid bruits. No lymphadenopathy. No JVD. Abdomen: Bowel sounds present, abdomen soft and non-tender without masses or hernias noted. Msk:  No spine or cva tenderness. No weakness, no joint deformities or effusions. Extremities: No clubbing, cyanosis or edema. DP/PT/Radials 2+ and equal bilaterally. Neuro: Alert to name. Unable to tell year.  Psych:  Confused Skin: No rashes or lesions noted.  Labs:   Lab Results  Component Value Date   WBC 6.7 11/02/2016   HGB 12.2 (L) 11/02/2016   HCT 38.0 (L) 11/02/2016   MCV 83.5 11/02/2016   PLT 311 11/02/2016   No results for input(s): INR in the last 72 hours.  Recent Labs Lab 11/02/16 1255  11/06/16 0719  NA 139  < > 139  K 3.6  < > 3.7  CL 105  < > 105  CO2 24  < > 26  BUN 15  < > 11  CREATININE 0.84  < > 1.03  CALCIUM 9.0  < > 9.0  PROT 6.4*  --   --   BILITOT 1.0  --   --   ALKPHOS 48  --   --   ALT 20  --   --   AST 42*  --   --   GLUCOSE 82  < > 104*  ALBUMIN 3.7  --   --   < > = values in this interval not displayed. Magnesium  Date Value Ref Range Status  11/05/2016 1.7 1.7 - 2.4 mg/dL Final    Recent Labs  11/03/16 1824 11/04/16 0411 11/05/16 0856 11/06/16 0719  CKTOTAL 1,057* 883* 530* 312   No results found for: LIPASE, AMYLASE TSH  Date/Time Value Ref Range Status  09/08/2016 02:16 AM 3.056 0.350 - 4.500 uIU/mL Final    Comment:    Performed by a 3rd Generation assay with a functional sensitivity of <=0.01 uIU/mL.  11/01/2014 03:37 PM 3.350 0.350 - 4.500 uIU/mL Final    Comment:    Performed at [facility]    Vitamin B-12  Date/Time Value Ref Range Status  09/08/2016 02:16 AM 274 180 - 914 pg/mL Final    Comment:    (NOTE) This assay is not validated for testing neonatal or myeloproliferative syndrome specimens for Vitamin B12 levels.    Ferritin  Date/Time Value Ref Range Status  09/08/2016 02:16 AM 11 (L) 24 - 336  ng/mL Final   TIBC  Date/Time Value Ref Range Status  09/08/2016 02:16 AM 342 250 - 450 ug/dL Final   Iron  Date/Time Value Ref Range Status  09/08/2016 02:16 AM 62 45 - 182 ug/dL Final    Radiology:  No results found.  ASSESSMENT AND PLAN:     1. Abnormal EKG - No changes in EKG. Except one is reading LPFB and another is not. Patient is asymptomatic. Troponin negative. No cardiac work up needed. Will sign off.       SignedLeanor Kail, PA 11/06/2016, 12:14 PM Pager [phone]  Co-Sign MD  70 year old male with past medical history of hypertension, dementia, mental illness, recurrent falls for evaluation of abnormal electrocardiogram. Patient was admitted on December 22 after being found in the floor. He states he was reaching for something and fell and could not get up. He has been confused. Initial CK 1480. Patient denies dyspnea, chest pain, palpitations or syncope. Electrocardiogram was obtained and shows sinus rhythm with right bundle branch block. Troponin normal. Cardiology asked to evaluate.  Patient has sinus rhythm with right bundle branch block. RBBB has been noted since 1/17/ He is not having cardiac symptoms including no dyspnea, chest pain; he denies syncope and states falls are because he loses his balance. I do not think further cardiac workup is indicated. Would not draw further troponins. Other issues per primary care. We will sign off. Please call with questions.  Kirk Ruths, MD

## 2016-11-06 NOTE — Progress Notes (Signed)
Family Medicine Teaching Service Daily Progress Note Intern Pager: [phone]  Patient name: Evan Charles Medical record number: [id number] Date of birth: March 01, 1946 Age: 70 y.o. Gender: male  Primary Care Provider: PROVIDER NOT [place]  Consultants: none, consider psych Code Status: full per prior code status  Pt Overview and Major Events to Date:  12/22: AMS, found down on floor covered in feces/urine. Head CT neg. [id number]  Assessment and Plan: Evan Charles is a 70 y.o. male presenting with confusion after being found down at home. PMH is significant for GERD, hypertension, unspecified mental illness, prostate CA, neuropathy, GERD and chronic back pain.  Rhabdomyolysis, resolved: CK 312 this am, continues to down trend.  -1.02L UOP in 24 hrs. Good PO intake   Back pain: Denies back pain this morning.  Reports to one examiner that he fell on his back, although story is variable between examiners.  - consider imaging to evaluate possibility of compression fracture if not improving - Kpad and tylenol   Altered mental status: Per psychiatry, possibly to delirium superimposed on dementia.  Patient is confused with unknown baseline function (per [place], patient's cognition is very variable). Currently is oriented to self and place but not year. Home medication include seroquel but EKG on 10/24/16 shows prolonged QTc (slightly improved to 495 this hospitalization).  - continue home seroquel for now - holding home tramadol.   - Psychiatry consulted: continue quetiapine 25mg  qhs, start try Seroquel PRN for agitation or Trazodone PRN if QTC > 549msec. AM EKG with QTC 508 - APS case open, per RN's report, as well  Bi-fasicular Block: Has a known RBBB but AM EKG with new left posterior fascicular block. Denied any pain this morning.  - troponin I - cardiology consulted, appreciate reccs   Hypokalemia, resolved: K 3.1 > 3.7 - KDur 81meq x 1 dose ordered to keep K >4 to  avoid Torsades de points per psychiatry  - Check magnesium - daily BMP  Generalized weakness, h/o multiple falls at home. CT head neg. Patient had recent admission with similar presentation on 09/07/16, was discharged to SNF but after returning home has had progressive decline. Suspect will need higher level of care since patient lives at home alone and has no familial support. - PT/OT consult ordered - social work consult   Left wrist contracture: concern by EDP about mononeuropathy due to immobility, however this was noted on ED visit on 12/12, at which time he was placed in a volar splint and was advised to f/u with ortho.  X-ray at that time notes the 2nd and 3rd MCPs looked unusual, however felt it was secondary to positional and degenerative changes - consider f/u with ortho as an outpatient.   H/o HTN. BP normal this AM On norvasc and HCTZ at home. - Continue HCTZ back.   - Consider restarting Norvasc once LE edema resolves. - monitor BP  GERD - continue home protonix  H/o prostate CA s/p prostatectomy. Patient denies any urinary complaints currently.  FEN/GI: Regular diet, protonix, colace Prophylaxis: lovenox  Disposition: pending improvement in mentation, clearing of CK, PT/OT recs  Subjective:  Wakes up to voice. States that he would like to sleep. Denies any pain. Oriented to self and place but not year.   Objective: Temp:  [97.7 F (36.5 C)-98.6 F (37 C)] 97.7 F (36.5 C) (12/26 0628) Pulse Rate:  [87-96] 87 (12/26 0628) Resp:  [18-20] 20 (12/26 0628) BP: (109-154)/(50-104) 115/70 (12/26 0628) SpO2:  [  94 %-100 %] 94 % (12/26 [id number]) Physical Exam: General: Lying in bed in NAD. Cardiovascular: RRR. No murmurs, rubs, or gallops noted. No LE edema today Respiratory: No increased WOB. CTAB without wheezing, rhonchi, or crackles noted over the anterior chest wall. Abdomen: +BS, soft, non-distended, non-tender.  MSK: Left wrist in splint, no tenderness to  palpation. Skin: No rashes noted over exposed skin.  Neuro: AOx2 (not year). PERRL, EOMI,  Facial movements symmetric. Psych:  Mood stable during my exam, Intermittently confused.    Intake/Output Summary (Last 24 hours) at 11/06/16 1011 Last data filed at 11/06/16 0859  Gross per 24 hour  Intake              960 ml  Output             1500 ml  Net             -540 ml   Laboratory:  Recent Labs Lab 11/02/16 1255  WBC 6.7  HGB 12.2*  HCT 38.0*  PLT 311    Recent Labs Lab 11/02/16 1255  11/04/16 0411 11/05/16 0856 11/06/16 0719  NA 139  < > 141 139 139  K 3.6  < > 3.4* 3.1* 3.7  CL 105  < > 111 105 105  CO2 24  < > 24 25 26   BUN 15  < > 8 <5* 11  CREATININE 0.84  < > 0.89 0.80 1.03  CALCIUM 9.0  < > 8.6* 8.7* 9.0  PROT 6.4*  --   --   --   --   BILITOT 1.0  --   --   --   --   ALKPHOS 48  --   --   --   --   ALT 20  --   --   --   --   AST 42*  --   --   --   --   GLUCOSE 82  < > 111* 104* 104*  < > = values in this interval not displayed. CK 1480 >> 530 > 312 EtOH <5 UDS neg  Imaging/Diagnostic Tests: No results found.  Smiley Houseman, MD 11/06/2016, 10:11 AM PGY-2, [place] Intern pager: [phone], text pages welcome

## 2016-11-06 NOTE — Care Management Note (Addendum)
Case Management Note  Patient Details  Name: Evan Charles MRN: [id number] Date of Birth: Jun 08, 1946  Subjective/Objective:                 Spoke with patient at the bedside. He is aware that SNF days are used, and anticipates HH. He states he receives care at [facility]. CM attempt to contact x2, however call would not go through. Will attempt again tomorrow. Patient states that he has a HHA who comes every three days. He has a walker WC and cane. States [name] is next contact, patient states [name] comes to house to see him everyday.  12/27 Patient insurance updated o include [employer] [place]. CSW aware, and checking if patient has coverage for SNF, not likely since patient dc'd 11/2 to [facility]. Per CSW [facility] cannot assist with SNF placement. Patient active with [facility] after [place] from [facility]. Per ED notes, DSS and APS  Referrals have been placed. [name] CSW following. CM spoke with resident to request updated psych eval. [name] [phone] ext [phone].   Action/Plan:  Anticipate DC to [facility].   12/27 ~15:45 Spoke with [name] CSW, patient approved for LOG. CM notified Dr [name]. Anticipate DC to SNF as facilitated by CSW.  Expected Discharge Date:                  Expected Discharge Plan:  [place]  In-House Referral:     Discharge planning Services  CM Consult  Post Acute Care Choice:    Choice offered to:     DME Arranged:    DME Agency:     HH Arranged:    [place] Agency:     Status of Service:  In process, will continue to follow  If discussed at Long Length of Stay Meetings, dates discussed:    Additional Comments:  Carles Collet, RN 11/06/2016, 4:02 PM

## 2016-11-06 NOTE — Progress Notes (Addendum)
Update: Patient is not approved for an LOG and will have to discharge home with home health if family does not want to pay privately. Left message for daughter.  CSW confirmed that patient does not have [facility] SNF benefits. In November, patient went to [facility] on an LOG. CSW investigating to see if we are able to offer another LOG at this time.  Evan Charles LCSWA [phone]

## 2016-11-07 DIAGNOSIS — Z789 Other specified health status: Secondary | ICD-10-CM

## 2016-11-07 DIAGNOSIS — Z7409 Other reduced mobility: Secondary | ICD-10-CM

## 2016-11-07 DIAGNOSIS — Z9889 Other specified postprocedural states: Secondary | ICD-10-CM

## 2016-11-07 DIAGNOSIS — F0391 Unspecified dementia with behavioral disturbance: Secondary | ICD-10-CM

## 2016-11-07 DIAGNOSIS — Z79899 Other long term (current) drug therapy: Secondary | ICD-10-CM

## 2016-11-07 LAB — CBC
HCT: 37.7 % — ABNORMAL LOW (ref 39.0–52.0)
Hemoglobin: 12.1 g/dL — ABNORMAL LOW (ref 13.0–17.0)
MCH: 26.7 pg (ref 26.0–34.0)
MCHC: 32.1 g/dL (ref 30.0–36.0)
MCV: 83.2 fL (ref 78.0–100.0)
PLATELETS: 309 10*3/uL (ref 150–400)
RBC: 4.53 MIL/uL (ref 4.22–5.81)
RDW: 13.9 % (ref 11.5–15.5)
WBC: 6.1 10*3/uL (ref 4.0–10.5)

## 2016-11-07 LAB — BASIC METABOLIC PANEL
ANION GAP: 6 (ref 5–15)
BUN: 12 mg/dL (ref 6–20)
CALCIUM: 8.9 mg/dL (ref 8.9–10.3)
CO2: 29 mmol/L (ref 22–32)
Chloride: 104 mmol/L (ref 101–111)
Creatinine, Ser: 0.97 mg/dL (ref 0.61–1.24)
Glucose, Bld: 99 mg/dL (ref 65–99)
POTASSIUM: 3.7 mmol/L (ref 3.5–5.1)
Sodium: 139 mmol/L (ref 135–145)

## 2016-11-07 LAB — MAGNESIUM: Magnesium: 1.8 mg/dL (ref 1.7–2.4)

## 2016-11-07 MED ORDER — MAGNESIUM SULFATE 2 GM/50ML IV SOLN
2.0000 g | Freq: Once | INTRAVENOUS | Status: AC
  Administered 2016-11-07: 2 g via INTRAVENOUS
  Filled 2016-11-07: qty 50

## 2016-11-07 MED ORDER — POTASSIUM CHLORIDE CRYS ER 20 MEQ PO TBCR
40.0000 meq | EXTENDED_RELEASE_TABLET | Freq: Once | ORAL | Status: AC
  Administered 2016-11-07: 40 meq via ORAL
  Filled 2016-11-07: qty 2

## 2016-11-07 NOTE — Progress Notes (Signed)
Family Medicine Teaching Service Daily Progress Note Intern Pager: [phone]  Patient name: Evan Charles Medical record number: [id number] Date of birth: 05-12-1946 Age: 70 y.o. Gender: male  Primary Care Provider: PROVIDER NOT [place]  Consultants: none, consider psych Code Status: full per prior code status  Pt Overview and Major Events to Date:  12/22: AMS, found down on floor covered in feces/urine. Head CT neg. [id number]  Assessment and Plan: Evan Charles is a 70 y.o. male presenting with confusion after being found down at home. PMH is significant for GERD, hypertension, unspecified mental illness, prostate CA, neuropathy, GERD and chronic back pain.  Rhabdomyolysis, resolved: CK 312 12/26, will no longer trend.  -619mL UOP in 24 hrs. Good PO intake   Back pain: Reports his normal back pain today.  Reports to one examiner that he fell on his back, although story is variable between examiners.  - consider imaging to evaluate possibility of compression fracture if not improving - Kpad and tylenol   Altered mental status: Per psychiatry, possibly to delirium superimposed on dementia.  Patient is confused with unknown baseline function (per [facility], patient's cognition is very variable). Currently is oriented x3. Home medication include seroquel but EKG on 10/24/16 shows prolonged QTc (slightly improved to 495 this hospitalization).  - continue home seroquel for now - holding home tramadol.   - Psychiatry consulted: continue quetiapine 25mg  qhs, start try Seroquel PRN for agitation or Trazodone PRN if QTC > 575msec. 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 Denied any pain this morning.  - troponin WNL, cards would not continue to trend - cardiology consulted, appreciate recs:  no concern for acute cardiac process, will sign off  Hypokalemia, resolved: K 3.1 > 3.7 - KDur 19meq x 1 dose ordered to keep K >4 to avoid Torsades de points per psychiatry  - Magnesium 1.8, replaced today - daily BMP  Generalized weakness, h/o multiple falls at home. CT head neg. Patient had recent admission with similar presentation on 09/07/16, was discharged to SNF but after returning home has had progressive decline. Suspect will need higher level of care since patient lives at home alone and has no familial support. - PT/OT consult ordered - social work consult   Left wrist contracture: concern by EDP about mononeuropathy due to immobility, however this was noted on ED visit on 12/12, at which time he was placed in a volar splint and was advised to f/u with ortho.  X-ray at that time notes the 2nd and 3rd MCPs looked unusual, however felt it was secondary to positional and degenerative changes - consider f/u with ortho as an outpatient.   H/o HTN. BP normal this AM On norvasc and HCTZ at home. - Continue HCTZ back.   - Consider restarting Norvasc once LE edema resolves. - monitor BP  GERD - continue home protonix  H/o prostate CA s/p prostatectomy. Patient denies any urinary complaints currently.  FEN/GI: Regular diet, protonix, colace Prophylaxis: lovenox  Disposition: pending improvement in mentation, clearing of CK, PT/OT recs  Subjective:  AOx3, complaining that his breakfast will be cold by the time he gets to eat it. Resistant to answering orientation questions because he wants to know how answering these will affect him getting out of  here.   Objective: Temp:  [97.4 F (36.3 C)-98 F (36.7 C)] 97.9 F (36.6 C) (12/27 0526) Pulse Rate:  [81-88] 81 (12/27 0526) Resp:  [18-19] 18 (12/27 0526) BP: (106-122)/(66-80) 122/72 (12/27 0526) SpO2:  [97 %-98 %] 98 % (12/27 0526) Physical Exam: General: Lying in bed in NAD. Cardiovascular: RRR. No murmurs, rubs, or  gallops noted. No LE edema today Respiratory: No increased WOB. CTAB without wheezing, rhonchi, or crackles noted over the anterior chest wall. Abdomen: +BS, soft, non-distended, non-tender.  MSK: Left wrist in splint, no tenderness to palpation. Skin: No rashes noted over exposed skin.  Neuro: AOx3. Facial movements symmetric. Psych:  Mood stable during my exam   Intake/Output Summary (Last 24 hours) at 11/07/16 0949 Last data filed at 11/07/16 0527  Gross per 24 hour  Intake                0 ml  Output             1275 ml  Net            -1275 ml   Laboratory:  Recent Labs Lab 11/02/16 1255 11/07/16 0705  WBC 6.7 6.1  HGB 12.2* 12.1*  HCT 38.0* 37.7*  PLT 311 309    Recent Labs Lab 11/02/16 1255  11/05/16 0856 11/06/16 0719 11/07/16 0705  NA 139  < > 139 139 139  K 3.6  < > 3.1* 3.7 3.7  CL 105  < > 105 105 104  CO2 24  < > 25 26 29   BUN 15  < > <5* 11 12  CREATININE 0.84  < > 0.80 1.03 0.97  CALCIUM 9.0  < > 8.7* 9.0 8.9  PROT 6.4*  --   --   --   --   BILITOT 1.0  --   --   --   --   ALKPHOS 48  --   --   --   --   ALT 20  --   --   --   --   AST 42*  --   --   --   --   GLUCOSE 82  < > 104* 104* 99  < > = values in this interval not displayed. CK 1480 >> 530 > 312 EtOH <5 UDS neg  Imaging/Diagnostic Tests: No results found.  Sela Hilding, MD 11/07/2016, 9:49 AM PGY-1, [place] Intern pager: [phone], text pages welcome

## 2016-11-07 NOTE — Progress Notes (Signed)
Currently out of K pads. One ordered when one becomes available.

## 2016-11-07 NOTE — Consult Note (Signed)
[facility] Consult   Reason for Consult:  Medication recommendation, capacity evaluation to be discharged to home Referring Physician:  Dr. Ronnie Doss Patient Identification: Evan Charles MRN:  [id number] Principal Diagnosis: Acute delirium Diagnosis:   Patient Active Problem List   Diagnosis Date Noted  . Hypokalemia [E87.6] 11/05/2016  . Difficulty in walking, not elsewhere classified [R26.2]   . Dementia [F03.90] 09/12/2016  . Acute delirium [R41.0] 09/08/2016  . Rhabdomyolysis [M62.82] 09/08/2016  . Normocytic anemia [D64.9] 09/08/2016  . Altered mental status [R41.82] 09/08/2016  . Fall at home [W19.XXXA, Y92.099]   . UTI (lower urinary tract infection) [N39.0] 07/25/2016  . Falls [phone].[id number]] 07/25/2016  . Mild cognitive impairment [G31.84] 07/25/2016  . Elevated CK [R74.8] 07/25/2016  . 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 Immobility [Z74.09] 03/20/2016  . Generalized weakness [R53.1] 03/20/2016  . Failure to thrive in adult [R62.7] 03/20/2016  . Neuropathy ([place]) [G62.9]   . Essential hypertension [I10]   . Physical deconditioning [R53.81]   . 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 GERD without esophagitis [K21.9] 12/19/2015  . Bilateral lower extremity edema [R60.0] 12/19/2015  . Intractable low back pain [M54.5] 02/14/2015  . Difficulty walking [R26.2] 02/14/2015  . Prostate cancer ([place]) [C61] 02/14/2015  . H/O: upper GI bleed [Z87.19]   . Chronic back pain [M54.9, G89.29]   . Visual hallucinations [R44.1]   . 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 male patient with unspecified dementia, hypertension, chronic back pain, who was admitted after being found down at home, treated for rhabdomyolysis. 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  - Per chart review, patient had three admissions since this September due to fall and altered mental status. Patient was discharged to skilled nursing facility but signed himself out several weeks ago. "He is been at home where he lives by himself and having home health care form twice a week. Family has been coming over daily." - Per admission note, "While he was trying to get on his chair at home, the chair gave way and her landed on the floor. He stated he landed on his back and hit the back of his head on the floor"  - Per nursing report, patient has been confused since this morning. He was combative at times and tried to swing to his nurse. Unknown sleep time. Patient was alert and oriented yesterday.  - Per team, patient was evaluated by PT/OT and both recommended SNF.  Patient is seen today 11/07/2016. [name] is in attendance. He is alert and oriented 3. He speaking clearly. He claims he wants to go home. He doesn't have any clear explanation for why he keeps falling except that he gets unsteady. He states that he eats well at home and drinks fluids and does not use drugs or alcohol. He does have 3 children who reside in [place] and they think he should go to a nursing home by his report. He is still intermittently confused and states that his ex-wife "was seeing kissing a man on a train platform." He states that he takes some type of pain medication at home but doesn't know the name of it and his wife thinks that the medication is the cause of his confusion. The chart indicates that he  was taking tramadol at home He is tolerating the Seroquel without difficulty  Past Psychiatric History: (per chart review) Outpatient:  Per chart, "He was taken medication treatment from [facility] in the past" Psychiatry admission: [facility] for psychosis in 2016 per chart Previous suicide attempt: unknown Past trials of  medication: unknown History of violence: unknown  Risk to Self: Is patient at risk for suicide?: No Risk to Others:   Prior Inpatient Therapy:   Prior Outpatient Therapy:    Past Medical History:  Past Medical History:  Diagnosis Date  . Back pain   . Chronic mental illness   . Colitis   . Dementia   . GERD (gastroesophageal reflux disease)   . Hypertension   . Neuropathy ([place])    " MY HANDS "  . Prostate cancer [facility])     Past Surgical History:  Procedure Laterality Date  . KNEE ARTHROSCOPY    . LAMINECTOMY    . PROSTATECTOMY     Family History:  Family History  Problem Relation Age of Onset  . Cancer Brother   . Cancer Maternal Grandmother    Family Psychiatric  History: mother- dementia (at unknown age) (per chart review) Social History:  History  Alcohol Use No     History  Drug Use No    Social History   Social History  . Marital status: Single    Spouse name: N/A  . Number of children: N/A  . Years of education: N/A   Occupational History  . Retired    Social History Main Topics  . Smoking status: Former Smoker    Types: Cigarettes  . Smokeless tobacco: Never Used  . Alcohol use No  . Drug use: No  . Sexual activity: Not Asked   Other Topics Concern  . None   Social History Narrative  . None   Additional Social History:   Unable to obtain due to patient current mental status Allergies:   Allergies  Allergen Reactions  . Tomato Itching and Rash    Labs:  Results for orders placed or performed during the hospital encounter of 11/02/16 (from the past 48 hour(s))  Basic metabolic panel     Status: Abnormal   Collection Time: 11/06/16  7:19 AM  Result Value Ref Range   Sodium 139 135 - 145 mmol/L   Potassium 3.7 3.5 - 5.1 mmol/L   Chloride 105 101 - 111 mmol/L   CO2 26 22 - 32 mmol/L   Glucose, Bld 104 (H) 65 - 99 mg/dL   BUN 11 6 - 20 mg/dL   Creatinine, Ser 1.03 0.61 - 1.24 mg/dL   Calcium 9.0 8.9 - 10.3 mg/dL   GFR calc non Af  Amer >60 >60 mL/min   GFR calc Af Amer >60 >60 mL/min    Comment: (NOTE) The eGFR has been calculated using the CKD EPI equation. This calculation has not been validated in all clinical situations. eGFR's persistently <60 mL/min signify possible Chronic Kidney Disease.    Anion gap 8 5 - 15  CK     Status: None   Collection Time: 11/06/16  7:19 AM  Result Value Ref Range   Total CK 312 49 - 397 U/L  Troponin I     Status: None   Collection Time: 11/06/16 11:52 AM  Result Value Ref Range   Troponin I <0.03 <0.03 ng/mL  Basic metabolic panel     Status: None   Collection Time: 11/07/16  7:05 AM  Result Value Ref Range   Sodium 139 135 - 145 mmol/L   Potassium 3.7 3.5 - 5.1 mmol/L   Chloride 104 101 - 111 mmol/L   CO2 29 22 - 32 mmol/L   Glucose, Bld 99 65 - 99 mg/dL   BUN 12 6 - 20 mg/dL   Creatinine, Ser 6.75 0.61 - 1.24 mg/dL   Calcium 8.9 8.9 - 61.2 mg/dL   GFR calc non Af Amer >60 >60 mL/min   GFR calc Af Amer >60 >60 mL/min    Comment: (NOTE) The eGFR has been calculated using the CKD EPI equation. This calculation has not been validated in all clinical situations. eGFR's persistently <60 mL/min signify possible Chronic Kidney Disease.    Anion gap 6 5 - 15  Magnesium     Status: None   Collection Time: 11/07/16  7:05 AM  Result Value Ref Range   Magnesium 1.8 1.7 - 2.4 mg/dL  CBC     Status: Abnormal   Collection Time: 11/07/16  7:05 AM  Result Value Ref Range   WBC 6.1 4.0 - 10.5 K/uL   RBC 4.53 4.22 - 5.81 MIL/uL   Hemoglobin 12.1 (L) 13.0 - 17.0 g/dL   HCT 54.8 (L) 32.3 - 46.8 %   MCV 83.2 78.0 - 100.0 fL   MCH 26.7 26.0 - 34.0 pg   MCHC 32.1 30.0 - 36.0 g/dL   RDW 87.3 73.0 - 81.6 %   Platelets 309 150 - 400 K/uL    Current Facility-Administered Medications  Medication Dose Route Frequency Provider Last Rate Last Dose  . acetaminophen (TYLENOL) tablet 650 mg  650 mg Oral Q6H PRN Lovena Neighbours, MD   650 mg at 11/03/16 2210  . docusate sodium  (COLACE) capsule 100 mg  100 mg Oral BID PRN Leland Her, DO   100 mg at 11/06/16 2122  . enoxaparin (LOVENOX) injection 40 mg  40 mg Subcutaneous Q24H Leland Her, DO   40 mg at 11/06/16 2123  . hydrALAZINE (APRESOLINE) injection 10 mg  10 mg Intravenous Q4H PRN Araceli Bouche, DO   10 mg at 11/05/16 2044  . hydrochlorothiazide (MICROZIDE) capsule 12.5 mg  12.5 mg Oral Daily Ashly M Gottschalk, DO   12.5 mg at 11/06/16 1051  . pantoprazole (PROTONIX) EC tablet 40 mg  40 mg Oral BID Leland Her, DO   40 mg at 11/07/16 0949  . QUEtiapine (SEROQUEL) tablet 25 mg  25 mg Oral QHS Leland Her, DO   25 mg at 11/06/16 2122   Head CT 10/23/2016 FINDINGS: Brain: Generalized atrophy. Normal ventricular morphology. No midline shift or mass effect. Small vessel chronic ischemic changes of deep cerebral white matter. No intracranial hemorrhage, mass lesion, evidence of acute infarction, or extra-axial fluid collection. Dense calcification within falx.  Musculoskeletal: Strength & Muscle Tone: decreased Gait & Station: unable to assess, patient lying in the bed Patient leans: Backward  Psychiatric Specialty Exam: Physical Exam  Review of Systems  Unable to perform ROS: Acuity of condition  All other systems reviewed and are negative.   Blood pressure 112/66, pulse 94, temperature 97.4 F (36.3 C), temperature source Oral, resp. rate 17, height 5\' 9"  (1.753 m), weight 85.3 kg (188 lb), SpO2 99 %.Body mass index is 27.76 kg/m.  General Appearance: Casual, sitting up in the chair   Eye Contact:  Good   Speech: Fairly clear   Volume:  Decreased  Mood:  Euthymic   Affect:  Blunt  Thought Process:  Much more organized than he was in the past although he still gets somewhat confused   Orientation:  Oriented to person place date and situation   Thought Content:  Somewhat disorganized but improved   Suicidal Thoughts:  No   Homicidal Thoughts: No   Memory:  poor  Judgement:  Impaired  Insight:   Lacking  Psychomotor Activity:  Decreased  Concentration:  Concentration: Poor and Attention Span: Poor  Recall: Poor   Fund of Knowledge:Fair   Language:  good  Akathisia:  no  Handed:    AIMS (if indicated):     Assets:  Social Support  ADL's:  Impaired  Cognition:  Impaired,  Moderate  Sleep:   unknown   Assessment Evan Charles is a 70 y.o. male patient with unspecified neurocognitive disorder, hypertension, chronic back pain, who was admitted after being found down at home, treated for rhabdomyolysis. Patient had similar presentation three times since September. 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   At this point patient is much clearer than he was in previous days. He is alert and oriented 3. However he still is somewhat confused and has not make good decisions regarding his care at home. His capacity for making decisions about his medical care is still quite limited. Family should be consulted regarding these decisions. He should avoid pain medication as this seems to cause more confusion and altered mental status. Continue low-dose Seroquel as he is tolerating it without difficulty    Treatment Plan Summary: Plan as above  Disposition: No evidence of imminent risk to self or others at present.    Thank you for your consult. We will sign off. Please contact psychiatry for any questions or concerns.  Levonne Spiller, MD 11/07/2016 2:57 PM

## 2016-11-07 NOTE — Consult Note (Signed)
   [facility] Inpatient Consult   11/07/2016  Evan Charles November 23, 1945 [id number]   Chart reviewed for re-admission.  Spoke with inpatient RNCM who states patient has [employer] active.  Chart reviewed and reveals the patient is a 70yo admitted after found down at home with rhabdomyolysis. PMHx: mental illness, back pain, HTN, GERD, prostate CA, TKA Rt, neuropathy . Patient will likely discharge to SNF.  Social worker following for disposition and needs.   Natividad Brood, RN BSN [facility] Liaison  [phone] business mobile phone Toll free office [phone]

## 2016-11-07 NOTE — Progress Notes (Signed)
Physical Therapy Treatment Patient Details Name: Evan Charles MRN: [id number] DOB: Sep 24, 1946 Today's Date: 11/07/2016    History of Present Illness 70yo admitted after found down at home with rhabdomyolysis. PMHx: mental illness, back pain, HTN, GERD, prostate CA, TKA Rt, neuropathy    PT Comments    Pt remains to require min to mod assist.  Able to advance gait distance with assist.  Pt will continue to require short term SNF placement to improve strength and functional mobility before returning home.    Follow Up Recommendations  SNF;Supervision/Assistance - 24 hour     Equipment Recommendations  None recommended by PT    Recommendations for Other Services       Precautions / Restrictions Precautions Precautions: Fall Restrictions Weight Bearing Restrictions: No    Mobility  Bed Mobility Overal bed mobility: Needs Assistance Bed Mobility: Supine to Sit     Supine to sit: Supervision;HOB elevated     General bed mobility comments: verbal cues for sequence, use of rail and HHA to bring trunk to EOB  Transfers Overall transfer level: Needs assistance Equipment used: Rolling walker (2 wheeled) Transfers: Sit to/from Stand Sit to Stand: Mod assist         General transfer comment: verbal cues for hand placement, heavy posterior lean, Pt required cues for hand placement and assist to forward weight shift.    Ambulation/Gait Ambulation/Gait assistance: Min assist;Mod assist Ambulation Distance (Feet): 80 Feet Assistive device: Rolling walker (2 wheeled) Gait Pattern/deviations: Shuffle;Trunk flexed   Gait velocity interpretation: Below normal speed for age/gender General Gait Details: cues for posture, position in RW, assist to place LUE on RW and difficulty maintaining grip with Left wrist splint.  PTA required increased assist to turn and advance RW.     Stairs            Wheelchair Mobility    Modified Rankin (Stroke Patients Only)        Balance Overall balance assessment: Needs assistance   Sitting balance-Leahy Scale: Poor       Standing balance-Leahy Scale: Poor                      Cognition Arousal/Alertness: Awake/alert Behavior During Therapy: WFL for tasks assessed/performed Overall Cognitive Status: No family/caregiver present to determine baseline cognitive functioning                 General Comments: Pt able to follow commands well.  Pt wanted to shoes to walk in but easy to redirect.      Exercises      General Comments        Pertinent Vitals/Pain Pain Assessment: No/denies pain    Home Living                      Prior Function            PT Goals (current goals can now be found in the care plan section) Acute Rehab PT Goals Patient Stated Goal: return home Potential to Achieve Goals: Fair Progress towards PT goals: Progressing toward goals    Frequency    Min 3X/week      PT Plan Current plan remains appropriate    Co-evaluation             End of Session Equipment Utilized During Treatment: Gait belt Activity Tolerance: Patient tolerated treatment well Patient left: in chair;with call bell/phone within reach;with chair alarm set     Time:  [id number] PT Time Calculation (min) (ACUTE ONLY): 16 min  Charges:  $Gait Training: 8-22 mins                    G Codes:      Cristela Blue 30-Nov-2016, 12:52 PM  Governor [place], PTA pager [phone]

## 2016-11-08 LAB — BASIC METABOLIC PANEL
Anion gap: 3 — ABNORMAL LOW (ref 5–15)
BUN: 11 mg/dL (ref 6–20)
CO2: 29 mmol/L (ref 22–32)
Calcium: 8.6 mg/dL — ABNORMAL LOW (ref 8.9–10.3)
Chloride: 104 mmol/L (ref 101–111)
Creatinine, Ser: 0.88 mg/dL (ref 0.61–1.24)
GFR calc Af Amer: >60 mL/min (ref >60)
GLUCOSE: 101 mg/dL — AB (ref 65–99)
Potassium: 4 mmol/L (ref 3.5–5.1)
Sodium: 136 mmol/L (ref 135–145)

## 2016-11-08 LAB — MAGNESIUM: Magnesium: 2.1 mg/dL (ref 1.7–2.4)

## 2016-11-08 NOTE — NC FL2 (Signed)
[place] LEVEL OF CARE SCREENING TOOL     IDENTIFICATION  Patient Name: Evan Charles Birthdate: Sep 09, 1946 Sex: male Admission Date (Current Location): 11/02/2016  [facility] and [place] Number:  Herbalist and Address:  [facility] [place], [place] [address] [place], [place] [address]      Provider Number: [id number]  Attending Physician Name and Address:  Kinnie Feil, MD  Relative Name and Phone Number:  Olegario Shearer, spouse, [phone]    Current Level of Care: Hospital Recommended Level of Care: [place] Prior Approval Number:    Date Approved/Denied:   PASRR Number: [id number]  Discharge Plan: SNF    Current Diagnoses: Patient Active Problem List   Diagnosis Date Noted  . Impaired mobility and ADLs   . Hypokalemia 11/05/2016  . Difficulty in walking, not elsewhere classified   . Dementia 09/12/2016  . Acute delirium 09/08/2016  . Rhabdomyolysis 09/08/2016  . Normocytic anemia 09/08/2016  . Altered mental status 09/08/2016  . Fall at home   . UTI (lower urinary tract infection) 07/25/2016  . Falls 07/25/2016  . Mild cognitive impairment 07/25/2016  . Elevated CK 07/25/2016  . 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 Immobility 03/20/2016  . Generalized weakness 03/20/2016  . Failure to thrive in adult 03/20/2016  . Neuropathy ([place])   . Essential hypertension   . Physical deconditioning   . 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 GERD without esophagitis 12/19/2015  . Bilateral lower extremity edema 12/19/2015  . Intractable low back pain 02/14/2015  . Difficulty walking 02/14/2015  . Prostate cancer ([place]) 02/14/2015  . H/O: upper GI bleed   . Chronic back pain   . Visual hallucinations   . Psychoses     Orientation RESPIRATION BLADDER Height & Weight     Self, Place  Normal Incontinent, External catheter Weight: 85.3 kg (188 lb) Height:  5\' 9"  (175.3 cm)  BEHAVIORAL  SYMPTOMS/MOOD NEUROLOGICAL BOWEL NUTRITION STATUS      Continent Diet (Please see DC Summary)  AMBULATORY STATUS COMMUNICATION OF NEEDS Skin   Limited Assist Verbally Normal                       Personal Care Assistance Level of Assistance  Bathing, Feeding, Dressing Bathing Assistance: Limited assistance Feeding assistance: Independent Dressing Assistance: Limited assistance     Functional Limitations Info             SPECIAL CARE FACTORS FREQUENCY  PT (By licensed PT)     PT Frequency: 5x/week              Contractures      Additional Factors Info  Code Status, Allergies, Psychotropic Code Status Info: Full Allergies Info: Tomato Psychotropic Info: Seroquel         Current Medications (11/08/2016):  This is the current hospital active medication list Current Facility-Administered Medications  Medication Dose Route Frequency Provider Last Rate Last Dose  . acetaminophen (TYLENOL) tablet 650 mg  650 mg Oral Q6H PRN Marjie Skiff, MD   650 mg at 11/03/16 2210  . docusate sodium (COLACE) capsule 100 mg  100 mg Oral BID PRN Bufford Lope, DO   100 mg at 11/06/16 2122  . enoxaparin (LOVENOX) injection 40 mg  40 mg Subcutaneous Q24H Bufford Lope, DO   40 mg at 11/07/16 2003  . hydrALAZINE (APRESOLINE) injection 10 mg  10 mg Intravenous Q4H PRN [facility]  N Rumley, DO   10 mg at 11/05/16 2044  . hydrochlorothiazide (MICROZIDE) capsule 12.5 mg  12.5 mg Oral Daily Ashly M Gottschalk, DO   12.5 mg at 11/08/16 [id number]  . pantoprazole (PROTONIX) EC tablet 40 mg  40 mg Oral BID Bufford Lope, DO   40 mg at 11/08/16 0819  . QUEtiapine (SEROQUEL) tablet 25 mg  25 mg Oral QHS Bufford Lope, DO   25 mg at 11/07/16 2117     Discharge Medications: Please see discharge summary for a list of discharge medications.  Relevant Imaging Results:  Relevant Lab Results:   Additional Information SSN: [id number]   Patient should have [place] days left-plan will be to apply for  Medicaid and [place] Rilen Shukla, LCSWA

## 2016-11-08 NOTE — Clinical Social Work Note (Signed)
Clinical Social Work Assessment  Patient Details  Name: Evan Charles MRN: [id number] Date of Birth: 01-08-1946  Date of referral:  11/08/16               Reason for consult:  Facility Placement                Permission sought to share information with:  Facility Art therapist granted to share information::  No  Name::        Agency::  SNFs  Relationship::     Contact Information:     Housing/Transportation Living arrangements for the past 2 months:  Single Family Home Source of Information:  Patient Patient Interpreter Needed:  None Criminal Activity/Legal Involvement Pertinent to Current Situation/Hospitalization:  No - Comment as needed Significant Relationships:  None Lives with:  Self Do you feel safe going back to the place where you live?  No Need for family participation in patient care:  Yes (Comment)  Care giving concerns:  CSW received consult for possible SNF placement at time of discharge. Patient is disoriented and assessed by psych not to have capacity. Patient is not wanting to go to snf, but he lives alone and an APS report was made by his home health company. His daughter does not want to be involved with his care and has no other family. CSW left voicemails for his "nephew" as well. Patient is in his copay days at [facility] and has a bill there. Patient needs a Medicaid application for long term placement. CSW to continue to follow and assist with discharge planning needs.   Social Worker assessment / plan:  CSW to find placement for patient.  Employment status:  Retired Nurse, adult PT Recommendations:  [place] / Referral to community resources:  [place]  Patient/Family's Response to care:  Patient does not want to go to a facility but he is not able to take care of himself and does not have capacity.  Patient/Family's Understanding of and Emotional Response to  Diagnosis, Current Treatment, and Prognosis:  No questions at this time.  Emotional Assessment Appearance:  Appears stated age Attitude/Demeanor/Rapport:  Unable to Assess Affect (typically observed):  Unable to Assess Orientation:  Oriented to Self, Oriented to Place Alcohol / Substance use:  Not Applicable Psych involvement (Current and /or in the community):  No (Comment)  Discharge Needs  Concerns to be addressed:  Care Coordination, Decision making concerns Readmission within the last 30 days:  No Current discharge risk:  Lack of support system, Lives alone, Cognitively Impaired Barriers to Discharge:  Continued Medical Work up   [employer], [place] 11/08/2016, 9:06 AM

## 2016-11-08 NOTE — Care Management Important Message (Signed)
Important Message  Patient Details  Name: Evan Charles MRN: [id number] Date of Birth: 1946-03-10   Medicare Important Message Given:  Yes    Nathen May 11/08/2016, 10:49 AM

## 2016-11-08 NOTE — Progress Notes (Signed)
Called and left VM for both [name] (nephew/friend) and ex-wife, Cline Cools. Wanted to discuss whether either of them would be willing to be a temporary guardian for patient. Left call back number of [phone] and ask to speak to CSW.  Ralene Ok, MD

## 2016-11-08 NOTE — Progress Notes (Addendum)
Update: APS and CSW spoke with patient's nephew, [name], who is willing to do anything needed for patient (willing to sign him in to a facility but does not want Guardianship). CSW corrected his contact info on chart. APS coming to visit patient on Friday to see if they can convince him to go or introduce idea of Guardianship.  Patient's ex-wife was at bedside. She stated that she checks on patient since they share a dog. Patient appears alert and oriented today. He states he does not want to go to another facility. He recognizes that therapy might help him, but he would rather return home. He states he has been in and out of snf over the past few months and he could ask his nephew to come check on him more often. CSW introduced possibility of completing a Medicaid application. Patient said he had started one, but it asked a lot of questions about his past financials that he wasn't sure how to answer. Patient's ex-wife stated that she would be wiling to go to DSS tomorrow to start the Medicaid application. CSW fixed her number in the chart.  CSW followed up with APS regarding this patient. They do have an open case that they are reviewing. CSW provided updates. Patient is currently refusing snf per MD, even though he does not have capacity. CSW continuing to follow.  [name] Evan Charles LCSWA [phone]

## 2016-11-08 NOTE — Progress Notes (Signed)
Family Medicine Teaching Service Daily Progress Note Intern Pager: [phone]  Patient name: Evan Charles Medical record number: [id number] Date of birth: [birth year] Age: 70 y.o. Gender: male  Primary Care Provider: PROVIDER NOT [place]  Consultants: none, consider psych Code Status: full per prior code status  Pt Overview and Major Events to Date:  12/22: AMS, found down on floor covered in feces/urine. Head CT neg. [id number]  Assessment and Plan: Evan Charles is a 70 y.o. male presenting with confusion after being found down at home. PMH is significant for GERD, hypertension, unspecified mental illness, prostate CA, neuropathy, GERD and chronic back pain.  Rhabdomyolysis, resolved: CK 312 12/26, will no longer trend.  -monitor fluid status  Back pain: Reports his normal back pain today.  Reports to one examiner that he fell on his back, although story is variable between examiners.  - consider imaging to evaluate possibility of compression fracture if not improving - Kpad and tylenol  - no additional tramadol as this likely precipitated delirium   Altered mental status: Per psychiatry, possibly to delirium superimposed on dementia.  Patient is confused with unknown baseline function (per [place], patient's cognition is very variable). Currently is oriented x3. Home medication include seroquel but EKG on 10/24/16 shows prolonged QTc (slightly improved to 495 this hospitalization).  - continue home seroquel for now - holding home tramadol - Psychiatry consulted: continue quetiapine 25mg  qhs, start try Seroquel PRN for agitation or Trazodone PRN if QTC > 56msec. 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 Denied any pain this morning.  - troponin WNL, cards would not continue to trend - cardiology  consulted, appreciate recs: no concern for acute cardiac process, will sign off  Hypokalemia, resolved: K 3.1 > 3.7 -K 4.0 and Mg>2 per psych to avoid torsades de pointes, achieved  - daily BMP  Generalized weakness, h/o multiple falls at home. CT head neg. Patient had recent admission with similar presentation on 09/07/16, was discharged to SNF but after returning home has had progressive decline. Suspect will need higher level of care since patient lives at home alone and has no familial support. - PT/OT consult ordered - social work consult   Left wrist contracture: concern by EDP about mononeuropathy due to immobility, however this was noted on ED visit on 12/12, at which time he was placed in a volar splint and was advised to f/u with ortho.  X-ray at that time notes the 2nd and 3rd MCPs looked unusual, however felt it was secondary to positional and degenerative changes - consider f/u with ortho as an outpatient.  - offered patient to remove brace today, he declined as it "gives him strength"  H/o HTN. BP normal this AM On norvasc and HCTZ at home. - Continue HCTZ   - monitor BP  GERD - continue home protonix  H/o prostate CA s/p prostatectomy. Patient denies any urinary complaints currently.  FEN/GI: Regular diet, protonix, colace Prophylaxis: lovenox  Disposition: medically ready for SNF  Subjective:  AOx2, cannot recall year. Lengthy discussion regarding placement. Patient would not like to go to a facility, but is unable to teachback reasons why he is not safe at home several recent admissions with him found down for extended periods. Reports his family has been here, I have not  seen them, but nurse reports they were present yesterday. Patient reports family discord. I asked RN to ensure we have accurate phone numbers for family if they come today.   Objective: Temp:  [97.4 F (36.3 C)-98.8 F (37.1 C)] 98.3 F (36.8 C) (12/28 [id number]) Pulse Rate:  [80-94] 80 (12/28  0611) Resp:  [17-20] 18 (12/28 0611) BP: (112-124)/(66-80) 124/80 (12/28 0611) SpO2:  [99 %] 99 % (12/28 [id number]) Physical Exam: General: Lying in bed in NAD. Cardiovascular: RRR. No murmurs, rubs, or gallops noted. No LE edema today Respiratory: No increased WOB. CTAB without wheezing, rhonchi, or crackles noted over the anterior chest wall. Abdomen: +BS, soft, non-distended, non-tender.  MSK: Left wrist in splint, no tenderness to palpation. Skin: No rashes noted over exposed skin.  Neuro: AOx3. Facial movements symmetric. Psych:  Mood stable during my exam   Intake/Output Summary (Last 24 hours) at 11/08/16 0953 Last data filed at 11/07/16 1854  Gross per 24 hour  Intake                0 ml  Output              400 ml  Net             -400 ml   Laboratory:  Recent Labs Lab 11/02/16 1255 11/07/16 0705  WBC 6.7 6.1  HGB 12.2* 12.1*  HCT 38.0* 37.7*  PLT 311 309    Recent Labs Lab 11/02/16 1255  11/06/16 0719 11/07/16 0705 11/08/16 0512  NA 139  < > 139 139 136  K 3.6  < > 3.7 3.7 4.0  CL 105  < > 105 104 104  CO2 24  < > 26 29 29   BUN 15  < > 11 12 11   CREATININE 0.84  < > 1.03 0.97 0.88  CALCIUM 9.0  < > 9.0 8.9 8.6*  PROT 6.4*  --   --   --   --   BILITOT 1.0  --   --   --   --   ALKPHOS 48  --   --   --   --   ALT 20  --   --   --   --   AST 42*  --   --   --   --   GLUCOSE 82  < > 104* 99 101*  < > = values in this interval not displayed. CK 1480 >> 530 > 312 EtOH <5 UDS neg  Imaging/Diagnostic Tests: No results found.  Sela Hilding, MD 11/08/2016, 9:53 AM PGY-1, [place] Intern pager: [phone], text pages welcome

## 2016-11-09 ENCOUNTER — Inpatient Hospital Stay
Admission: RE | Admit: 2016-11-09 | Discharge: 2016-12-20 | Disposition: A | Discharge status: Home / Self Care | Payer: Non-veteran care | Source: Ambulatory Visit | Attending: Internal Medicine | Admitting: Internal Medicine

## 2016-11-09 DIAGNOSIS — R410 Disorientation, unspecified: Secondary | ICD-10-CM | POA: Diagnosis not present

## 2016-11-09 DIAGNOSIS — R279 Unspecified lack of coordination: Secondary | ICD-10-CM | POA: Diagnosis not present

## 2016-11-09 DIAGNOSIS — M6281 Muscle weakness (generalized): Secondary | ICD-10-CM | POA: Diagnosis not present

## 2016-11-09 DIAGNOSIS — R42 Dizziness and giddiness: Secondary | ICD-10-CM | POA: Diagnosis not present

## 2016-11-09 DIAGNOSIS — D649 Anemia, unspecified: Secondary | ICD-10-CM | POA: Diagnosis not present

## 2016-11-09 DIAGNOSIS — R627 Adult failure to thrive: Secondary | ICD-10-CM | POA: Diagnosis not present

## 2016-11-09 DIAGNOSIS — M24532 Contracture, left wrist: Secondary | ICD-10-CM | POA: Diagnosis not present

## 2016-11-09 DIAGNOSIS — K219 Gastro-esophageal reflux disease without esophagitis: Secondary | ICD-10-CM | POA: Diagnosis not present

## 2016-11-09 DIAGNOSIS — M6282 Rhabdomyolysis: Secondary | ICD-10-CM | POA: Diagnosis not present

## 2016-11-09 DIAGNOSIS — G5632 Lesion of radial nerve, left upper limb: Secondary | ICD-10-CM | POA: Diagnosis not present

## 2016-11-09 DIAGNOSIS — M545 Low back pain: Secondary | ICD-10-CM | POA: Diagnosis not present

## 2016-11-09 DIAGNOSIS — M4807 Spinal stenosis, lumbosacral region: Secondary | ICD-10-CM | POA: Diagnosis not present

## 2016-11-09 DIAGNOSIS — R600 Localized edema: Secondary | ICD-10-CM | POA: Diagnosis not present

## 2016-11-09 DIAGNOSIS — R531 Weakness: Secondary | ICD-10-CM | POA: Diagnosis not present

## 2016-11-09 DIAGNOSIS — F29 Unspecified psychosis not due to a substance or known physiological condition: Secondary | ICD-10-CM | POA: Diagnosis not present

## 2016-11-09 DIAGNOSIS — E876 Hypokalemia: Secondary | ICD-10-CM | POA: Diagnosis not present

## 2016-11-09 DIAGNOSIS — R262 Difficulty in walking, not elsewhere classified: Secondary | ICD-10-CM | POA: Diagnosis not present

## 2016-11-09 DIAGNOSIS — G8929 Other chronic pain: Secondary | ICD-10-CM | POA: Diagnosis not present

## 2016-11-09 DIAGNOSIS — M25532 Pain in left wrist: Secondary | ICD-10-CM | POA: Diagnosis not present

## 2016-11-09 DIAGNOSIS — H8110 Benign paroxysmal vertigo, unspecified ear: Secondary | ICD-10-CM | POA: Diagnosis not present

## 2016-11-09 DIAGNOSIS — I10 Essential (primary) hypertension: Secondary | ICD-10-CM | POA: Diagnosis not present

## 2016-11-09 DIAGNOSIS — R635 Abnormal weight gain: Secondary | ICD-10-CM | POA: Diagnosis not present

## 2016-11-09 DIAGNOSIS — Z9181 History of falling: Secondary | ICD-10-CM | POA: Diagnosis not present

## 2016-11-09 NOTE — Progress Notes (Signed)
Physical Therapy Treatment Patient Details Name: Evan Charles MRN: [id number] DOB: 20-Sep-1946 Today's Date: 11/09/2016    History of Present Illness 70yo admitted after found down at home with rhabdomyolysis. PMHx: mental illness, back pain, HTN, GERD, prostate CA, TKA Rt, neuropathy    PT Comments    Pt performed increased mobility but remains to require mod assist.  Cues for sequencing and safety.  Pt plan to d/c to SNF today.    Follow Up Recommendations  SNF;Supervision/Assistance - 24 hour     Equipment Recommendations  None recommended by PT    Recommendations for Other Services       Precautions / Restrictions Precautions Precautions: Fall Restrictions Weight Bearing Restrictions: No    Mobility  Bed Mobility Overal bed mobility: Needs Assistance Bed Mobility: Supine to Sit     Supine to sit: Supervision;HOB elevated     General bed mobility comments: verbal cues for sequence, use of rail and HHA to bring trunk to EOB  Transfers Overall transfer level: Needs assistance Equipment used: Rolling walker (2 wheeled) Transfers: Sit to/from Stand Sit to Stand: Mod assist         General transfer comment: verbal cues for hand placement, heavy posterior lean, Pt required cues for hand placement and assist to forward weight shift.  Better carryover during eccentric loading.    Ambulation/Gait Ambulation/Gait assistance: Min assist;Mod assist Ambulation Distance (Feet): 100 Feet Assistive device: Rolling walker (2 wheeled) Gait Pattern/deviations: Shuffle;Trunk flexed   Gait velocity interpretation: Below normal speed for age/gender General Gait Details: cues for posture, position in RW, assist to place LUE on RW and difficulty maintaining grip with Left wrist splint.  PTA required increased assist to turn and advance RW.  Pt performed  with R lateral lean wirth cues to increase BOS and weight shift to L.     Stairs            Wheelchair  Mobility    Modified Rankin (Stroke Patients Only)       Balance Overall balance assessment: Needs assistance   Sitting balance-Leahy Scale: Fair       Standing balance-Leahy Scale: Poor                      Cognition Arousal/Alertness: Awake/alert Behavior During Therapy: WFL for tasks assessed/performed Overall Cognitive Status: Difficult to assess                 General Comments: Pt following commands well during session    Exercises      General Comments        Pertinent Vitals/Pain Pain Assessment: No/denies pain    Home Living                      Prior Function            PT Goals (current goals can now be found in the care plan section) Acute Rehab PT Goals Patient Stated Goal: return home Potential to Achieve Goals: Fair Progress towards PT goals: Progressing toward goals    Frequency    Min 3X/week      PT Plan Current plan remains appropriate    Co-evaluation             End of Session Equipment Utilized During Treatment: Gait belt Activity Tolerance: Patient tolerated treatment well Patient left: in chair;with call bell/phone within reach;with chair alarm set     Time: [id number] PT Time Calculation (min) (ACUTE  ONLY): 19 min  Charges:  $Gait Training: 8-22 mins                    G Codes:      Cristela Blue 2016-11-25, 12:52 PM  Governor [place], PTA pager [phone]

## 2016-11-09 NOTE — Progress Notes (Signed)
Patient will discharge to [facility] Anticipated discharge date: 12/29 Family notified: [name] and DSS worker at [facility], occupational by [employer]- called at 2:20pm  [place] signing off.  Jorge Ny, LCSW Clinical Social Worker [phone]

## 2016-11-09 NOTE — Clinical Social Work Placement (Signed)
   CLINICAL SOCIAL WORK PLACEMENT  NOTE  Date:  11/09/2016  Patient Details  Name: Evan Charles MRN: [id number] Date of Birth: October 23, 1946  Clinical Social Work is seeking post-discharge placement for this patient at the [place] level of care (*CSW will initial, date and re-position this form in  chart as items are completed):  Yes   Patient/family provided with [place] Work Department's list of facilities offering this level of care within the geographic area requested by the patient (or if unable, by the patient's family).  Yes   Patient/family informed of their freedom to choose among providers that offer the needed level of care, that participate in Medicare, Medicaid or managed care program needed by the patient, have an available bed and are willing to accept the patient.  Yes   Patient/family informed of [facility]'s ownership interest in [facility] and [facility], as well as of the fact that they are under no obligation to receive care at these facilities.  PASRR submitted to EDS on       PASRR number received on       Existing PASRR number confirmed on 11/08/16     FL2 transmitted to all facilities in geographic area requested by pt/family on 11/08/16     FL2 transmitted to all facilities within larger geographic area on       Patient informed that his/her managed care company has contracts with or will negotiate with certain facilities, including the following:        Yes   Patient/family informed of bed offers received.  Patient chooses bed at [facility]     Physician recommends and patient chooses bed at      Patient to be transferred to [facility] on 11/09/16.  Patient to be transferred to facility by ptar     Patient family notified on 11/09/16 of transfer.  Name of family member notified:  [name] and DSS worker at bedside     PHYSICIAN Please sign FL2     Additional Comment:     _______________________________________________ Jorge Ny, LCSW 11/09/2016, 2:22 PM

## 2016-11-09 NOTE — Progress Notes (Signed)
Transportation here to get pt. Pt dressed and report called to facility.

## 2016-11-13 ENCOUNTER — Non-Acute Institutional Stay (SKILLED_NURSING_FACILITY): Payer: Medicare Other | Admitting: Internal Medicine

## 2016-11-13 ENCOUNTER — Encounter: Payer: Self-pay | Admitting: Internal Medicine

## 2016-11-13 DIAGNOSIS — K219 Gastro-esophageal reflux disease without esophagitis: Secondary | ICD-10-CM

## 2016-11-13 DIAGNOSIS — R531 Weakness: Secondary | ICD-10-CM | POA: Diagnosis not present

## 2016-11-13 DIAGNOSIS — M6282 Rhabdomyolysis: Secondary | ICD-10-CM

## 2016-11-13 DIAGNOSIS — R410 Disorientation, unspecified: Secondary | ICD-10-CM

## 2016-11-13 DIAGNOSIS — I10 Essential (primary) hypertension: Secondary | ICD-10-CM

## 2016-11-13 NOTE — Progress Notes (Signed)
Provider:  Veleta Miners Location:   [facility] Room Number: 104/D Place of Service:  SNF (31)  PCP: PROVIDER NOT IN SYSTEM Patient Care Team: Provider Not In System as PCP - General  Extended Emergency Contact Information Primary Emergency Contact: Anderson,Edith Address: [address]          [place] [address] [place] Phone: [phone] Relation: Spouse Secondary Emergency Contact: Dimas Agu[place] Phone: [phone] Relation: Daughter  Code Status: Most Goals of Care: Advanced Directive information Advanced Directives 11/13/2016  Does Patient Have a Medical Advance Directive? Yes  Type of Advance Directive Out of facility DNR (pink MOST or yellow form)  Does patient want to make changes to medical advance directive? No - Patient declined  Copy of [place] in Chart? -  Would patient like information on creating a medical advance directive? No - Patient declined      Chief Complaint  Patient presents with  . New Admit To SNF    HPI: Patient is a 71 y.o. male seen today for admission to SNF for therapy and further disposition with possible LTC. Patient has h/o HTN, GERD and Prostate cancer S/P Prostatomy , Neuropathy and Low back pain.  He has had  number of visits to Hospital in past few months for fall and altered mental status. He was admitted this time agin due to fall and altered mental status. His CK was elevated again to 1480. Per EMS he was found on Kitchen floor surrounded by urine and feces. They were not sure how long he was there for. Patient was hydrated in the hospital. He did not have any signs of infection and his altered status is thought to be due to Tramadol.  He has been discharged to the SNF before but patient usually signs himself out. According to notes [facility] had filled for A[facility] for him due to his living conditions. They also says that he is  very erratic in taking his meds and does not have any caretaker. Patient has very different story today,. He says that he did not fall just could not get out of bed due to weakness. He says that he is very independent and eventually wants to go home as nursing home is there to get his money. 'Probably [facility] can fix my house'.  He doesn't want anyone to take his House away. He does say he gets depressed but not more then usual.    Past Medical History:  Diagnosis Date  . Back pain   . Chronic mental illness   . Colitis   . Dementia   . GERD (gastroesophageal reflux disease)   . Hypertension   . Neuropathy ([place])    " MY HANDS "  . Prostate cancer [facility])    Past Surgical History:  Procedure Laterality Date  . KNEE ARTHROSCOPY    . LAMINECTOMY    . PROSTATECTOMY      reports that he has quit smoking. His smoking use included Cigarettes. He has never used smokeless tobacco. He reports that he does not drink alcohol or use drugs. Social History   Social History  . Marital status: Single    Spouse name: N/A  . Number of children: N/A  . Years of education: N/A   Occupational History  . Retired    Social History Main Topics  . Smoking status: Former Smoker    Types: Cigarettes  . Smokeless tobacco: Never Used  .  Alcohol use No  . Drug use: No  . Sexual activity: Not on file   Other Topics Concern  . Not on file   Social History Narrative  . No narrative on file    Functional Status Survey:    Family History  Problem Relation Age of Onset  . Cancer Brother   . Cancer Maternal Grandmother     Health Maintenance  Topic Date Due  . INFLUENZA VACCINE  01/10/2017 (Originally 06/12/2016)  . ZOSTAVAX  01/10/2017 (Originally 12/12/2005)  . Hepatitis C Screening  01/10/2017 (Originally February 24, 1946)  . PNA vac Low Risk Adult (1 of 2 - PCV13) 01/10/2017 (Originally 12/12/2010)  . COLONOSCOPY  03/01/2021  . TETANUS/TDAP  08/15/2023    Allergies  Allergen Reactions  . Tomato  Itching and Rash    Current Outpatient Prescriptions on File Prior to Visit  Medication Sig Dispense Refill  . amLODipine (NORVASC) 5 MG tablet Take 1 tablet (5 mg total) by mouth daily.    Marland Kitchen docusate sodium (COLACE) 100 MG capsule Take 1 capsule (100 mg total) by mouth 2 (two) times daily.    . hydrochlorothiazide (MICROZIDE) 12.5 MG capsule Take 12.5 mg by mouth daily.    . pantoprazole (PROTONIX) 40 MG tablet Take 1 tablet (40 mg total) by mouth 2 (two) times daily. 60 tablet 0  . QUEtiapine (SEROQUEL) 25 MG tablet Take 1 tablet (25 mg total) by mouth at bedtime.     No current facility-administered medications on file prior to visit.     Review of Systems  Constitutional: Negative for activity change, appetite change, chills and diaphoresis.  HENT: Negative for congestion, rhinorrhea and sinus pain.   Respiratory: Negative for apnea, cough, choking, chest tightness and shortness of breath.   Cardiovascular: Positive for leg swelling. Negative for chest pain and palpitations.  Gastrointestinal: Negative for abdominal distention, abdominal pain, constipation, diarrhea and nausea.  Genitourinary: Negative for dysuria, frequency and urgency.  Musculoskeletal: Positive for back pain. Negative for arthralgias, joint swelling and myalgias.  Skin: Negative for color change, pallor and rash.  Neurological: Negative for dizziness, syncope, speech difficulty, weakness, light-headedness, numbness and headaches.  Psychiatric/Behavioral: Positive for behavioral problems, confusion and dysphoric mood. Negative for agitation, hallucinations, self-injury and sleep disturbance. The patient is not nervous/anxious.     Vitals:   11/13/16 1022  BP: 126/78  Pulse: 87  Resp: 19  Temp: 97.8 F (36.6 C)  TempSrc: Oral   There is no height or weight on file to calculate BMI. Physical Exam  Constitutional: He appears well-developed and well-nourished.  HENT:  Head: Normocephalic.  Mouth/Throat:  Oropharynx is clear and moist.  Eyes: Pupils are equal, round, and reactive to light.  Neck: Neck supple.  Cardiovascular: Normal rate, regular rhythm and normal heart sounds.   No murmur heard. Pulmonary/Chest: Effort normal and breath sounds normal. No respiratory distress. He has no wheezes.  Abdominal: Soft. Bowel sounds are normal. He exhibits no distension. There is no tenderness. There is no rebound.  Musculoskeletal: He exhibits edema.  Lymphadenopathy:    He has no cervical adenopathy.  Neurological: He is alert.  Was oriented to place. Did know his birthday. Thought it was 2019.  Has good strength in Right Upper and Lower extremity. Has 4/5 in Left LE but could not close his left fist and had like the contracture of the hand.  Skin: Skin is warm and dry. No rash noted. No erythema.  Psychiatric: He has a normal mood and affect. His  behavior is normal.  Questionable Judgement and understanding of situation.    Labs reviewed: Basic Metabolic Panel:  Recent Labs  11/02/16 1255  11/05/16 0856 11/06/16 0719 11/07/16 0705 11/08/16 0512  NA 139  < > 139 139 139 136  K 3.6  < > 3.1* 3.7 3.7 4.0  CL 105  < > 105 105 104 104  CO2 24  < > 25 26 29 29   GLUCOSE 82  < > 104* 104* 99 101*  BUN 15  < > <5* 11 12 11   CREATININE 0.84  < > 0.80 1.03 0.97 0.88  CALCIUM 9.0  < > 8.7* 9.0 8.9 8.6*  MG  --   --  1.7  --  1.8 2.1  PHOS 2.9  --   --   --   --   --   < > = values in this interval not displayed. Liver Function Tests:  Recent Labs  09/07/16 1920 09/09/16 0332 11/02/16 1255  AST 35 49* 42*  ALT 18 20 20   ALKPHOS 45 40 48  BILITOT 0.9 0.4 1.0  PROT 7.0 5.8* 6.4*  ALBUMIN 4.0 3.1* 3.7   No results for input(s): LIPASE, AMYLASE in the last 8760 hours.  Recent Labs  09/08/16 0216 11/02/16 1255  AMMONIA 28 25   CBC:  Recent Labs  03/20/16 1134  07/25/16 1700  09/07/16 1920  10/23/16 1445 11/02/16 1255 11/07/16 0705  WBC 6.7  < > 8.3  < > 8.7  < > 6.3  6.7 6.1  NEUTROABS 5.0  --  6.1  --  6.7  --   --   --   --   HGB 12.0*  < > 12.6*  < > 12.7*  < > 12.8* 12.2* 12.1*  HCT 39.2  < > 39.1  < > 38.2*  < > 39.4 38.0* 37.7*  MCV 83.9  < > 84.3  < > 83.4  < > 84.4 83.5 83.2  PLT 290  < > 325  < > 340  < > 291 311 309  < > = values in this interval not displayed. Cardiac Enzymes:  Recent Labs  11/04/16 0411 11/05/16 0856 11/06/16 0719 11/06/16 1152  CKTOTAL 883* 530* 312  --   TROPONINI  --   --   --  <0.03   BNP: Invalid input(s): POCBNP No results found for: HGBA1C Lab Results  Component Value Date   TSH 3.056 09/08/2016   Lab Results  Component Value Date   VITAMINB12 274 09/08/2016   No results found for: FOLATE Lab Results  Component Value Date   IRON 62 09/08/2016   TIBC 342 09/08/2016   FERRITIN 11 (L) 09/08/2016    Imaging and Procedures obtained prior to SNF admission: No results found.  Assessment/Plan  Acute delirium Patient seems to be doing well right now. He does not seem to have any Psychosis. But reviewing his Records he does have diagnosis of psychosis and was on Abilify and Zyprexa. I wonder if patient does get return of symptoms once he goes home and stops his meds.  Discussed with psych nurse and she thinks we should continue Seroquel right now and then evaluate him in 2 weeks to c where we are with psychosis. I think he will need chronic Antipsychotics.  His work up in the hospital was completely negative for any reason except Ultram. We will continue to hold Ultram.   Non-traumatic rhabdomyolysis CK was elevated in the hospital. But came down to  320 with hydration.  Essential hypertension BP well controlled Continue Norvasc and HCTZ.  GERD without esophagitis Continue on Protonix   Generalized weakness Will start  Therapy. Patient would need detail Cognitive evaluation. He is already wants to go home. I do not know how would be able to let him stay here if he decides to sigh himself  out.   Family/ staff Communication:   Labs/tests ordered: BMP and CBC.  Total time spent in this patient care encounter was 45_ minutes; greater than 50% of the visit spent coordinating care for problems addressed at this encounter. I have d/w Psch nurse and speech therapist.

## 2016-11-19 ENCOUNTER — Encounter: Payer: Self-pay | Admitting: Internal Medicine

## 2016-11-19 ENCOUNTER — Non-Acute Institutional Stay (SKILLED_NURSING_FACILITY): Payer: Medicare Other | Admitting: Internal Medicine

## 2016-11-19 ENCOUNTER — Other Ambulatory Visit: Payer: Self-pay | Admitting: *Deleted

## 2016-11-19 DIAGNOSIS — M545 Low back pain, unspecified: Secondary | ICD-10-CM

## 2016-11-19 DIAGNOSIS — R635 Abnormal weight gain: Secondary | ICD-10-CM

## 2016-11-19 DIAGNOSIS — G8929 Other chronic pain: Secondary | ICD-10-CM | POA: Diagnosis not present

## 2016-11-19 MED ORDER — TRAMADOL HCL 50 MG PO TABS: ORAL_TABLET | ORAL | 0 refills | Status: DC

## 2016-11-19 NOTE — Telephone Encounter (Signed)
[facility] #[phone] Fax: [phone]   

## 2016-11-19 NOTE — Progress Notes (Signed)
Location:   [facility] Room Number: 104/D Place of Service:  SNF (31) Provider:  Granville Lewis  PROVIDER NOT IN SYSTEM  Patient Care Team: Provider Not In System as PCP - General  Extended Emergency Contact Information Primary Emergency Contact: Anderson,Edith Address: [address]          [place] [address] [place] Phone: [phone] Relation: Spouse Secondary Emergency Contact: Simpson,Michelle  [place] Phone: [phone] Relation: Daughter  Code Status:  Full Code ,Most Goals of care: Advanced Directive information Advanced Directives 11/19/2016  Does Patient Have a Medical Advance Directive? Yes  Type of Advance Directive Out of facility DNR (pink MOST or yellow form)  Does patient want to make changes to medical advance directive? No - Patient declined  Copy of [place] in Chart? -  Would patient like information on creating a medical advance directive? No - Patient declined     Chief Complaint  Patient presents with  . Acute Visit    Weight gain   Also follow-up back pain  HPI:  Pt is a 71 y.o. male seen today for an acute visit forFollow-up of weight gain  Apparently weight done this morning was 189 this is up about 8 pounds from his admission weight-patient refused a re-weight to assess accuracy-.  Apparently he has been eating very well which apparently was not the case when he was at home.  In fact he had a candy bar and some potato chips in his lap when I examined him.  He is not complaining of any shortness of breath chest pain in fact he says he feels stronger when he is doing therapy.  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  Marland Kitchen  He states the pain is chronic.     Past Medical History:  Diagnosis Date  . Back pain   . Chronic mental illness   . Colitis   . Dementia   . GERD (gastroesophageal reflux disease)   . Hypertension   . Neuropathy ([place])    " MY HANDS "  . Prostate cancer [facility])    Past Surgical History:  Procedure Laterality Date  . KNEE ARTHROSCOPY    . LAMINECTOMY    . PROSTATECTOMY      Allergies  Allergen Reactions  . Tomato Itching and Rash    Current Outpatient Prescriptions on File Prior to Visit  Medication Sig Dispense Refill  . amLODipine (NORVASC) 5 MG tablet Take 1 tablet (5 mg total) by mouth daily.    Marland Kitchen docusate sodium (COLACE) 100 MG capsule Take 1 capsule (100 mg total) by mouth 2 (two) times daily.    . hydrochlorothiazide (MICROZIDE) 12.5 MG capsule Take 12.5 mg by mouth daily.    . pantoprazole (PROTONIX) 40 MG tablet Take 1 tablet (40 mg total) by mouth 2 (two) times daily. 60 tablet 0  . QUEtiapine (SEROQUEL) 25 MG tablet Take 1 tablet (25 mg total) by mouth at bedtime.  No current facility-administered medications on file prior to visit.     Review of Systems   Constitutional: Negative for activity change, appetite change, chills and diaphoresis.  HENT: Negative for congestion, rhinorrhea and sinus pain.   Respiratory: Negative for apnea, cough, choking, chest tightness and shortness of breath.   Cardiovascular: Positive for leg swelling. Negative for chest pain and palpitations. Says he has leg swelling that has improved Gastrointestinal: Negative for abdominal distention, abdominal pain, constipation, diarrhea and nausea.  Genitourinary: Negative for dysuria, frequency and urgency.  Musculoskeletal: Positive for back pain. Negative for  arthralgias, joint swelling and myalgias.  Skin: Negative for color change, pallor and rash.  Neurological: Negative for dizziness, syncope, speech difficulty, weakness, light-headedness, numbness and headaches.  Psychiatric/Behavioral: Positive for behavioral problems, confusion and dysphoric mood. Negative for agitation, hallucinations, self-injury and sleep disturbance. The patient is not nervous/anxious.      Immunization History  Administered Date(s) Administered  . DTaP 06/09/2001  . PPD Test 03/22/2016  . Tdap 08/14/2013   Pertinent  Health Maintenance Due  Topic Date Due  . INFLUENZA VACCINE  01/10/2017 (Originally 06/12/2016)  . PNA vac Low Risk Adult (1 of 2 - PCV13) 01/10/2017 (Originally 12/12/2010)  . COLONOSCOPY  03/01/2021   No flowsheet data found. Functional Status Survey:    He is afebrile pulse of 80 respirations 20 blood pressure 120/78-111/75 most recently  Physical Exam Constitutional: He appears well-developed and well-nourished.  HENT:  Head: Normocephalic.  Mouth/Throat: Oropharynx is clear and moist.   .  Cardiovascular: Normal rate, regular rhythm and normal heart sounds.   No murmur heard. Pulmonary/Chest: Effort normal and breath sounds normal. No respiratory distress. He has no wheezes.  Abdominal: Soft. Bowel sounds are normal. He exhibits no distension. There is no tenderness. There is no rebound.  Musculoskeletal: He exhibits edema.   .  Neurological: He is alert.  Was oriented to place.  Has good strength in Right Upper and Lower extremity.  he has 4 out of 5 strength left lower extremity-left upper extremity is currently in a splint .  I did not note any deformity of his lower back or acute tenderness to palpation there is some slight tenderness to palpation of the spine lumbar area however  Skin: Skin is warm and dry. No rash noted. No erythema.  Psychiatric: He has a normal mood and affect. His behavior is normal.  Questionable  Judgement and understanding of situation--but was pleasant and conversant Labs reviewed:  Recent Labs  11/02/16 1255  11/05/16 0856 11/06/16 0719 11/07/16 0705 11/08/16 0512  NA 139  < > 139 139 139 136  K 3.6  < > 3.1* 3.7 3.7 4.0  CL 105  < > 105 105 104 104  CO2 24  < > 25 26 29 29   GLUCOSE 82  < > 104* 104* 99 101*  BUN 15  < > <5* 11 12 11   CREATININE 0.84  < > 0.80 1.03 0.97 0.88  CALCIUM 9.0  < > 8.7* 9.0 8.9 8.6*  MG  --   --  1.7  --  1.8 2.1  PHOS 2.9  --   --   --   --   --   < > = values in this interval not displayed.  Recent Labs  09/07/16 1920 09/09/16 0332 11/02/16 1255  AST 35 49* 42*  ALT 18 20 20   ALKPHOS 45 40 48  BILITOT 0.9 0.4 1.0  PROT 7.0 5.8* 6.4*  ALBUMIN 4.0  3.1* 3.7    Recent Labs  03/20/16 1134  07/25/16 1700  09/07/16 1920  10/23/16 1445 11/02/16 1255 11/07/16 0705  WBC 6.7  < > 8.3  < > 8.7  < > 6.3 6.7 6.1  NEUTROABS 5.0  --  6.1  --  6.7  --   --   --   --   HGB 12.0*  < > 12.6*  < > 12.7*  < > 12.8* 12.2* 12.1*  HCT 39.2  < > 39.1  < > 38.2*  < > 39.4 38.0* 37.7*  MCV 83.9  < > 84.3  < > 83.4  < > 84.4 83.5 83.2  PLT 290  < > 325  < > 340  < > 291 311 309  < > = values in this interval not displayed. Lab Results  Component Value Date   TSH 3.056 09/08/2016   No results found for: HGBA1C No results found for: CHOL, HDL, LDLCALC, LDLDIRECT, TRIG, CHOLHDL  Significant Diagnostic Results in last 30 days:  Dg Chest 2 View  Result Date: 10/23/2016 CLINICAL DATA:  Mid chest pain for the past 2 days associated with severe back pain. Episode of pneumonia 2 weeks ago. History of prostate malignancy, former smoker. EXAM: CHEST  2 VIEW COMPARISON:  Portable chest x-ray of September 07, 2016 FINDINGS: There is chronic elevation of the left hemidiaphragm. There is no focal infiltrate or pleural effusion. The heart and pulmonary vascularity are normal. The mediastinum is normal in width. There is multilevel degenerative disc disease of  the thoracic spine. There is no compression fracture of the thoracic spine. IMPRESSION: There is no acute cardiopulmonary abnormality. Chronic elevation of the left hemidiaphragm. Multilevel degenerative disc disease of the thoracic spine. Electronically Signed   By: David  Martinique M.D.   On: 10/23/2016 15:20   Ct Head Wo Contrast  Result Date: 11/02/2016 CLINICAL DATA:  Dementia, found on floor today, altered mental status, headache, recent falls, hypertension, history prostate cancer EXAM: CT HEAD WITHOUT CONTRAST TECHNIQUE: Contiguous axial images were obtained from the base of the skull through the vertex without intravenous contrast. Sagittal and coronal MPR images reconstructed from axial data set. Scattered mild motion artifacts present. COMPARISON:  10/23/2016 FINDINGS: Brain: Generalized atrophy. Normal ventricular morphology. No midline shift or mass effect. Small vessel chronic ischemic changes of deep cerebral white matter. No intracranial hemorrhage, mass lesion, evidence of acute infarction, or extra-axial fluid collection. Dense calcification within falx. Vascular: Unremarkable Skull: Intact within limits of motion Sinuses/Orbits: Grossly clear within limits of motion Other: N/A IMPRESSION: Atrophy with small vessel chronic ischemic changes of deep cerebral white matter. No acute intracranial abnormalities. Electronically Signed   By: Lavonia Dana M.D.   On: 11/02/2016 13:34   Ct Head Wo Contrast  Result Date: 10/23/2016 CLINICAL DATA:  Status post fall with headache EXAM: CT HEAD WITHOUT CONTRAST TECHNIQUE: Contiguous axial images were obtained from the base of the skull through the vertex without intravenous contrast. COMPARISON:  Head CT 09/07/2016 FINDINGS: Brain: No mass lesion, intraparenchymal hemorrhage or extra-axial collection. No evidence of acute cortical infarct. Brain parenchyma and CSF-containing spaces are normal for age. Vascular: No hyperdense vessel or unexpected  calcification. Skull: Normal visualized skull base, calvarium and extracranial soft tissues. Sinuses/Orbits: No sinus fluid levels or advanced mucosal thickening. No mastoid effusion. Normal orbits. IMPRESSION: No acute intracranial abnormality. Electronically Signed   By: Ulyses Jarred M.D.   On: 10/23/2016 20:26   Dg Chest Portable 1 View  Result Date: 11/02/2016 CLINICAL DATA:  Altered mental status and weakness. EXAM: PORTABLE CHEST 1 VIEW COMPARISON:  10/23/2016 FINDINGS: Chronic elevation left hemidiaphragm with left lower lobe scarring, unchanged from prior studies. Right lung is clear. Negative for heart failure or pneumonia. No pleural effusion IMPRESSION: Chronic changes in the left lung base. No acute cardiopulmonary abnormality. Electronically Signed   By: Franchot Gallo M.D.   On: 11/02/2016 13:05   Dg Hand Complete Left  Result Date: 11/03/2016 CLINICAL DATA:  Possible left hand injury. EXAM: LEFT HAND - COMPLETE 3+ VIEW COMPARISON:  10/23/2016 FINDINGS: No evidence for fracture or dislocation. Again noted is some joint space narrowing at the second and third MCP joints. Soft tissues are unremarkable. IMPRESSION: No acute abnormality in left hand. Electronically Signed   By: Markus Daft M.D.   On: 11/03/2016 12:59   Dg Hand Complete Left  Result Date: 10/23/2016 CLINICAL DATA:  71 year old male with history of trauma from a fall 2 days ago complaining of left hand dysfunction. Unable to open hand and grip items. EXAM: LEFT HAND - COMPLETE 3+ VIEW COMPARISON:  No priors. FINDINGS: Three nonstandard views of the hand are submitted for evaluation. There is some mild joint space loss and subchondral sclerosis at the second and third MCP joints. Possible mild volar subluxation of both the second and third MCP joints as well, although this could simply be positional. Small os a chondroma off seen radial aspect of the distal aspect of the third middle phalanx. No acute displaced fracture or  dislocation. IMPRESSION: 1. The appearance of the second and third MCP joints is unusual, however, this is largely favored to be positional and degenerative on these nonstandard views. No definite radiographic evidence of significant acute traumatic injury to the hand. Electronically Signed   By: Vinnie Langton M.D.   On: 10/23/2016 20:11    Assessment/Plan  #1 back pain-this appears to be chronic with history of degenerative joint disease of lumbar spine --I did discuss this with Dr. Lyndel Safe-- will start tramadol 50 mg every 6 hours when necessary and monitor.  #2 weight gain-patient is eating much better which I suspect this could be contributing to  weight gain-have ordered a recheck tomorrow -- notify provider of results-his edema apparently is baseline to actually improved he is not complaining of any shortness of breath or chest pain-at this point will continue to monitor clinically   [id number]      Oralia Manis, [place]

## 2016-12-11 DIAGNOSIS — G5632 Lesion of radial nerve, left upper limb: Secondary | ICD-10-CM | POA: Diagnosis not present

## 2016-12-11 DIAGNOSIS — M25532 Pain in left wrist: Secondary | ICD-10-CM | POA: Diagnosis not present

## 2016-12-12 ENCOUNTER — Encounter: Payer: Self-pay | Admitting: Internal Medicine

## 2016-12-12 ENCOUNTER — Non-Acute Institutional Stay (SKILLED_NURSING_FACILITY): Payer: Medicare Other | Admitting: Internal Medicine

## 2016-12-12 DIAGNOSIS — R600 Localized edema: Secondary | ICD-10-CM | POA: Diagnosis not present

## 2016-12-12 DIAGNOSIS — R262 Difficulty in walking, not elsewhere classified: Secondary | ICD-10-CM | POA: Diagnosis not present

## 2016-12-12 DIAGNOSIS — I10 Essential (primary) hypertension: Secondary | ICD-10-CM

## 2016-12-12 DIAGNOSIS — H8110 Benign paroxysmal vertigo, unspecified ear: Secondary | ICD-10-CM | POA: Diagnosis not present

## 2016-12-12 NOTE — Progress Notes (Signed)
Location:   [facility] Room Number: 104/D Place of Service:  SNF (31) Provider:  Anjali,Gupta  PROVIDER NOT IN SYSTEM  Patient Care Team: Provider Not In System as PCP - General  Extended Emergency Contact Information Primary Emergency Contact: Anderson,Edith Address: [address]          [place] [address] [place] Phone: [phone] Relation: Spouse Secondary Emergency Contact: Simpson,Michelle  [place] Phone: [phone] Relation: Daughter  Code Status:  Full Code Goals of care: Advanced Directive information Advanced Directives 12/12/2016  Does Patient Have a Medical Advance Directive? Yes  Type of Advance Directive Out of facility DNR (pink MOST or yellow form)  Does patient want to make changes to medical advance directive? No - Patient declined  Copy of [place] in Chart? -  Would patient like information on creating a medical advance directive? No - Patient declined     Chief Complaint  Patient presents with  . Acute Visit    Dizzness+ Equal Lib Off    HPI:  Pt is a 71 y.o. male seen today for an acute visit for Dizziness.  Patient has h/o HTN, GERD and Prostate cancer S/P Prostactomy , Neuropathy and Low back pain.Also h/o Psychosis  Patient says that he has been having dizziness for past few days. He was not able to describe me but says that every time he moves his head he feels like things are moving around and he will pass out. He is trying to keep his head still as he is scared of getting that feeling. Some nausea associated with it.  He is feeling well otherwise. His appetite is good and he has gained almost 8 lbs since been in facility. Denies any Nasal congestion.Sinusitis., Any tinnitus.  No Cough or fever or Chest pain.  Past Medical History:  Diagnosis Date  . Back pain   . Chronic mental illness   . Colitis   . Dementia   . GERD (gastroesophageal reflux  disease)   . Hypertension   . Neuropathy ([place])    " MY HANDS "  . Prostate cancer [facility])    Past Surgical History:  Procedure Laterality Date  . KNEE ARTHROSCOPY    . LAMINECTOMY    . PROSTATECTOMY      Allergies  Allergen Reactions  . Tomato Itching and Rash   Current Outpatient Prescriptions on File Prior to Visit  Medication Sig Dispense Refill  . amLODipine (NORVASC) 5 MG tablet Take 1 tablet (5 mg total) by mouth daily.    Marland Kitchen docusate sodium (COLACE) 100 MG capsule Take 1 capsule (100 mg total) by mouth 2 (two) times daily.    . hydrochlorothiazide (MICROZIDE) 12.5 MG capsule Take 12.5 mg by mouth daily.    . pantoprazole (PROTONIX) 40 MG tablet Take 1 tablet (40 mg total) by mouth 2 (two) times daily. 60 tablet 0  . QUEtiapine (SEROQUEL) 25 MG tablet Take 1 tablet (25 mg total) by mouth at bedtime.    . traMADol (ULTRAM) 50 MG tablet Take one tablet by mouth every 6 hours as needed for pain 120 tablet 0   No current facility-administered medications on file prior to visit.     Review of Systems  Constitutional: Positive for activity change. Negative for appetite change, chills, diaphoresis, fatigue and fever.  HENT: Negative for congestion, postnasal drip, rhinorrhea, sinus pain and sinus pressure.   Respiratory: Negative for apnea, cough, choking, chest tightness  and shortness of breath.   Cardiovascular: Positive for leg swelling. Negative for chest pain and palpitations.  Gastrointestinal: Negative for abdominal distention, abdominal pain, anal bleeding, constipation, diarrhea and vomiting.  Neurological: Positive for dizziness and light-headedness. Negative for tremors, syncope, weakness and numbness.    Immunization History  Administered Date(s) Administered  . DTaP 06/09/2001  . PPD Test 03/22/2016  . Tdap 08/14/2013   Pertinent  Health Maintenance Due  Topic Date Due  . INFLUENZA VACCINE  01/10/2017 (Originally 06/12/2016)  . PNA vac Low Risk Adult (1 of 2 -  PCV13) 01/10/2017 (Originally 12/12/2010)  . COLONOSCOPY  03/01/2021   No flowsheet data found. Functional Status Survey:    Vitals:   12/12/16 1223  BP: 126/73  Pulse: 75  Resp: 20  Temp: 97.4 F (36.3 C)  TempSrc: Oral   There is no height or weight on file to calculate BMI. Physical Exam  Constitutional: He appears well-developed and well-nourished.  HENT:  Head: Normocephalic.  Mouth/Throat: Oropharynx is clear and moist.  Eyes: Pupils are equal, round, and reactive to light.  Neck: Neck supple.  Cardiovascular: Normal rate and regular rhythm.   Pulmonary/Chest: Effort normal and breath sounds normal. He has no wheezes. He has no rales.  Abdominal: Soft. Bowel sounds are normal. He exhibits no distension. There is no tenderness. There is no rebound.  Musculoskeletal:  Moderate edema in Both Extremities.  Neurological:  No nystagmus. I performed DIX Hallpike test and patient starting getting dizzy when I made him sit up.    Labs reviewed:  Recent Labs  11/02/16 1255  11/05/16 0856 11/06/16 0719 11/07/16 0705 11/08/16 0512  NA 139  < > 139 139 139 136  K 3.6  < > 3.1* 3.7 3.7 4.0  CL 105  < > 105 105 104 104  CO2 24  < > 25 26 29 29   GLUCOSE 82  < > 104* 104* 99 101*  BUN 15  < > <5* 11 12 11   CREATININE 0.84  < > 0.80 1.03 0.97 0.88  CALCIUM 9.0  < > 8.7* 9.0 8.9 8.6*  MG  --   --  1.7  --  1.8 2.1  PHOS 2.9  --   --   --   --   --   < > = values in this interval not displayed.  Recent Labs  09/07/16 1920 09/09/16 0332 11/02/16 1255  AST 35 49* 42*  ALT 18 20 20   ALKPHOS 45 40 48  BILITOT 0.9 0.4 1.0  PROT 7.0 5.8* 6.4*  ALBUMIN 4.0 3.1* 3.7    Recent Labs  03/20/16 1134  07/25/16 1700  09/07/16 1920  10/23/16 1445 11/02/16 1255 11/07/16 0705  WBC 6.7  < > 8.3  < > 8.7  < > 6.3 6.7 6.1  NEUTROABS 5.0  --  6.1  --  6.7  --   --   --   --   HGB 12.0*  < > 12.6*  < > 12.7*  < > 12.8* 12.2* 12.1*  HCT 39.2  < > 39.1  < > 38.2*  < > 39.4  38.0* 37.7*  MCV 83.9  < > 84.3  < > 83.4  < > 84.4 83.5 83.2  PLT 290  < > 325  < > 340  < > 291 311 309  < > = values in this interval not displayed. Lab Results  Component Value Date   TSH 3.056 09/08/2016   No results found  for: HGBA1C No results found for: CHOL, HDL, LDLCALC, LDLDIRECT, TRIG, CHOLHDL  Significant Diagnostic Results in last 30 days:  No results found.  Assessment/Plan Dizziness Most likely BPPV. Will start him on Meclizine PRN. Check Orthostatic BP Check CMP and CBC to rule out any Infectious process.   Essential hypertension  BP controlled on HCTZ and Norvasc.  Bilateral lower extremity edema  Patient has gained weight also but most likely due to improved appetite. Will try Ted hose for now. Do not want to use diuretic with his Dizziness right now.       Family/ staff Communication:   Labs/tests ordered:

## 2016-12-13 ENCOUNTER — Encounter (HOSPITAL_COMMUNITY)
Admission: RE | Admit: 2016-12-13 | Discharge: 2016-12-13 | Disposition: A | Discharge status: Home / Self Care | Payer: Medicare Other | Source: Skilled Nursing Facility | Attending: Internal Medicine | Admitting: Internal Medicine

## 2016-12-13 DIAGNOSIS — F0390 Unspecified dementia without behavioral disturbance: Secondary | ICD-10-CM | POA: Insufficient documentation

## 2016-12-13 DIAGNOSIS — I10 Essential (primary) hypertension: Secondary | ICD-10-CM | POA: Insufficient documentation

## 2016-12-13 DIAGNOSIS — D649 Anemia, unspecified: Secondary | ICD-10-CM | POA: Insufficient documentation

## 2016-12-13 DIAGNOSIS — K219 Gastro-esophageal reflux disease without esophagitis: Secondary | ICD-10-CM | POA: Insufficient documentation

## 2016-12-13 DIAGNOSIS — F29 Unspecified psychosis not due to a substance or known physiological condition: Secondary | ICD-10-CM | POA: Insufficient documentation

## 2016-12-13 LAB — BASIC METABOLIC PANEL
Anion gap: 7 (ref 5–15)
BUN: 15 mg/dL (ref 6–20)
CALCIUM: 9 mg/dL (ref 8.9–10.3)
CO2: 32 mmol/L (ref 22–32)
CREATININE: 0.88 mg/dL (ref 0.61–1.24)
Chloride: 99 mmol/L — ABNORMAL LOW (ref 101–111)
GFR calc non Af Amer: >60 mL/min (ref >60)
Glucose, Bld: 95 mg/dL (ref 65–99)
Potassium: 4 mmol/L (ref 3.5–5.1)
SODIUM: 138 mmol/L (ref 135–145)

## 2016-12-13 LAB — CBC
HCT: 38.9 % — ABNORMAL LOW (ref 39.0–52.0)
Hemoglobin: 12.5 g/dL — ABNORMAL LOW (ref 13.0–17.0)
MCH: 27.2 pg (ref 26.0–34.0)
MCHC: 32.1 g/dL (ref 30.0–36.0)
MCV: 84.7 fL (ref 78.0–100.0)
PLATELETS: 287 10*3/uL (ref 150–400)
RBC: 4.59 MIL/uL (ref 4.22–5.81)
RDW: 13.9 % (ref 11.5–15.5)
WBC: 5.5 10*3/uL (ref 4.0–10.5)

## 2016-12-20 ENCOUNTER — Non-Acute Institutional Stay (SKILLED_NURSING_FACILITY): Payer: Medicare Other | Admitting: Internal Medicine

## 2016-12-20 ENCOUNTER — Encounter: Payer: Self-pay | Admitting: Internal Medicine

## 2016-12-20 DIAGNOSIS — R410 Disorientation, unspecified: Secondary | ICD-10-CM

## 2016-12-20 DIAGNOSIS — F29 Unspecified psychosis not due to a substance or known physiological condition: Secondary | ICD-10-CM | POA: Diagnosis not present

## 2016-12-20 DIAGNOSIS — R531 Weakness: Secondary | ICD-10-CM | POA: Diagnosis not present

## 2016-12-20 DIAGNOSIS — I10 Essential (primary) hypertension: Secondary | ICD-10-CM

## 2016-12-20 DIAGNOSIS — R42 Dizziness and giddiness: Secondary | ICD-10-CM

## 2016-12-20 NOTE — Progress Notes (Signed)
Location:   [facility] Room Number: 104/D Place of Service:  SNF (31)  Provider: Kielan Dreisbach,Jearld Hemp  PCP: PROVIDER NOT IN SYSTEM Patient Care Team: Provider Not In System as PCP - General  Extended Emergency Contact Information Primary Emergency Contact: Anderson,Edith Address: [address]          [place] [address] [place] Phone: [phone] Relation: Spouse Secondary Emergency Contact: Simpson,Michelle  [place] Phone: [phone] Relation: Daughter  Code Status: Full Code Goals of care:  Advanced Directive information Advanced Directives 12/20/2016  Does Patient Have a Medical Advance Directive? Yes  Type of Advance Directive (No Data)  Does patient want to make changes to medical advance directive? No - Patient declined  Copy of [place] in Chart? -  Would patient like information on creating a medical advance directive? No - Patient declined     Allergies  Allergen Reactions  . Tomato Itching and Rash    Chief Complaint  Patient presents with  . Discharge Note    HPI:  71 y.o. male  seen today for discharge from facility.  He was admitted here in early January after hospitalization for fall and altered mental status.  Previously been admitted to the hospital apparently several times for the same diagnoses.  Most recently was admitted for fall and altered mental status his CK was elevated-she was finally kitchen floor surrounded apparently by urine and feces.  He was hydrated in the hospital he did not have any signs of infection as altered mental status was thought secondary to tramadol.  He was here for rehabilitation.  Since he's been reviewed with quite adamant he needed to gallop home.  There is some question about his dependability and taking his medications.  His stay here has been fairly uneventful he has gained strength he has been pleasant and  cooperative.  He does have a history of psychosis and continues on Seroquel.  He appears to be in good spirits today he's pleasant and cooperative and is quite adamant he is going home.  There has been some reports of confusion and he does appear to have confusion at times he was able to tell me his address day of the week the month somewhat confused to year and had difficulty name the president although he could recall some history about him.  He states he would do better at home this time that he feels stronger.  We still have trepidatioin about this but he is quite insistent he is going home    He is quite adamant he will take his medications when he is home-nursing staff has also spoken with his nephew who watches after him although does not live with him-.  Sincerely been very also was started on Antivert when necessary for concerns of BPH-this appears to be improved he does not take the [place] much.  Vital signs appear to be stable he does have a history of hypertension but we did orthostatics today blood pressure both times was 120/80-per nursing previous orthostatic blood pressures have been stable as well.  He is on Norvasc 5 mg a day as well as hydrochlorothiazide 12.5 mg a day.  He also complains at times of back pain we did restart the tramadol when necessary   With supervision--but he is not really taking this much and will discontinue this       Past Medical History:  Diagnosis Date  . Back pain   .  Chronic mental illness   . Colitis   . Dementia   . GERD (gastroesophageal reflux disease)   . Hypertension   . Neuropathy ([place])    " MY HANDS "  . Prostate cancer [facility])     Past Surgical History:  Procedure Laterality Date  . KNEE ARTHROSCOPY    . LAMINECTOMY    . PROSTATECTOMY        reports that he has quit smoking. His smoking use included Cigarettes. He has never used smokeless tobacco. He reports that he does not drink alcohol or use drugs. Social  History   Social History  . Marital status: Single    Spouse name: N/A  . Number of children: N/A  . Years of education: N/A   Occupational History  . Retired    Social History Main Topics  . Smoking status: Former Smoker    Types: Cigarettes  . Smokeless tobacco: Never Used  . Alcohol use No  . Drug use: No  . Sexual activity: Not on file   Other Topics Concern  . Not on file   Social History Narrative  . No narrative on file   Functional Status Survey:    Allergies  Allergen Reactions  . Tomato Itching and Rash    Pertinent  Health Maintenance Due  Topic Date Due  . INFLUENZA VACCINE  01/10/2017 (Originally 06/12/2016)  . PNA vac Low Risk Adult (1 of 2 - PCV13) 01/10/2017 (Originally 12/12/2010)  . COLONOSCOPY  03/01/2021    Medications: Current Outpatient Prescriptions on File Prior to Visit  Medication Sig Dispense Refill  . amLODipine (NORVASC) 5 MG tablet Take 1 tablet (5 mg total) by mouth daily.    Marland Kitchen docusate sodium (COLACE) 100 MG capsule Take 1 capsule (100 mg total) by mouth 2 (two) times daily.    . hydrochlorothiazide (MICROZIDE) 12.5 MG capsule Take 12.5 mg by mouth daily.    . pantoprazole (PROTONIX) 40 MG tablet Take 1 tablet (40 mg total) by mouth 2 (two) times daily. 60 tablet 0  . QUEtiapine (SEROQUEL) 25 MG tablet Take 1 tablet (25 mg total) by mouth at bedtime.    . traMADol (ULTRAM) 50 MG tablet Take one tablet by mouth every 6 hours as needed for pain 120 tablet 0   No current facility-administered medications on file prior to visit.      Review of Systems  General does not complaining fever chills has had some weight gain this is thought to be more appetite related.  Skin does not complain of rashes or itching.  Head ears eyes nose mouth and throat does not complain a sore throat or visual changes.  Respirations not complaining of any shortness of breath has occasional cough he says has not increased from baseline.  Cardiac does not  complaining of any chest pain does have some chronic lower extremity edema.  GI does not complain of nausea vomiting diarrhea constipation or abdominal discomfort.  GU is not complaining of dysuria.  Muscle skeletal does have some history of chronic back pain but does not complain of that today again he did have the when necessary tramadol but took it quite infrequently.  Neurologic is not complaining of headache syncope didn't complain of. No dizziness at one point but this appears to be improved he does have the Antivert when necessary.  Psych he does have a history of psychosis this appears to be stable at this point but continues to be somewhat fragile in this regards he is  pleasant and appropriate today.     Vitals:   12/20/16 1204  BP: 100/68  Pulse: 86  Resp: 20  Temp: 97.7 F (36.5 C)  TempSrc: Oral  SpO2: 96%  Again manual blood pressures sitting and standing today were both 120/80.  Weight is 192.6 this appears to be a gain of about 34 pounds since his admission I suspect appetite related  Physical Exam   Gen. this is a pleasant elderly male in no distress sitting comfortably on the side of bed.  His skin is warm and dry.  Eyes pupils appear reactive light sclerae and conjunctivae are clear he has prescription lenses.  Oropharynx clear mucous membranes moist.  Chest is clear to auscultation there is no labored breathing.  Heart is regular rate and rhythm without murmur gallop or rub he has chronic lower extremity edema this appears relatively baseline Skin history.  Abdomen is soft nontender with positive bowel sounds.  Muscle skeletal he is able to stand and ambulate a bit weak however but has gained strength --strength appears to be preserved largely in all 4 extremities.  Neurologic is grossly intact to speech is clear no lateralizing findings.    Labs reviewed: Basic Metabolic Panel:  Recent Labs  11/02/16 1255  11/05/16 0856  11/07/16 0705  11/08/16 0512 12/13/16 0700  NA 139  < > 139  < > 139 136 138  K 3.6  < > 3.1*  < > 3.7 4.0 4.0  CL 105  < > 105  < > 104 104 99*  CO2 24  < > 25  < > 29 29 32  GLUCOSE 82  < > 104*  < > 99 101* 95  BUN 15  < > <5*  < > 12 11 15   CREATININE 0.84  < > 0.80  < > 0.97 0.88 0.88  CALCIUM 9.0  < > 8.7*  < > 8.9 8.6* 9.0  MG  --   --  1.7  --  1.8 2.1  --   PHOS 2.9  --   --   --   --   --   --   < > = values in this interval not displayed. Liver Function Tests:  Recent Labs  09/07/16 1920 09/09/16 0332 11/02/16 1255  AST 35 49* 42*  ALT 18 20 20   ALKPHOS 45 40 48  BILITOT 0.9 0.4 1.0  PROT 7.0 5.8* 6.4*  ALBUMIN 4.0 3.1* 3.7   No results for input(s): LIPASE, AMYLASE in the last 8760 hours.  Recent Labs  09/08/16 0216 11/02/16 1255  AMMONIA 28 25   CBC:  Recent Labs  03/20/16 1134  07/25/16 1700  09/07/16 1920  11/02/16 1255 11/07/16 0705 12/13/16 0700  WBC 6.7  < > 8.3  < > 8.7  < > 6.7 6.1 5.5  NEUTROABS 5.0  --  6.1  --  6.7  --   --   --   --   HGB 12.0*  < > 12.6*  < > 12.7*  < > 12.2* 12.1* 12.5*  HCT 39.2  < > 39.1  < > 38.2*  < > 38.0* 37.7* 38.9*  MCV 83.9  < > 84.3  < > 83.4  < > 83.5 83.2 84.7  PLT 290  < > 325  < > 340  < > 311 309 287  < > = values in this interval not displayed. Cardiac Enzymes:  Recent Labs  11/04/16 0411 11/05/16 0856 11/06/16 0719 11/06/16 1152  CKTOTAL 883* 530* 312  --   TROPONINI  --   --   --  <0.03   BNP: Invalid input(s): POCBNP CBG:  Recent Labs  03/21/16 2147 03/22/16 0825 03/22/16 1150  GLUCAP 141* 94 153*    Procedures and Imaging Studies During Stay: No results found.  Assessment/Plan:  #1-history of acute delirium-this appears to have resolved he still does have a history of psychosis is on Seroquel he has been pleasant and cooperative in the facility does have at times some confusion which appears relatively baseline-he is adamant he is going home ir regardless today-he will need expedient  follow-up by primary care physician again he is followed by the [facility] also will need home health nursing support to make sure he's checked on-he also has a very supportive nephew who is quite involved with but does not live with him.  In the hospital was thought possibly tramadol maybe on causing the delirium he did get a when necessary here but seldom took it and was supervised-we will DC this upon discharge again he did not take it very often   #2 history of hypertension this appears relatively well controlled as noted above blood pressures manually were stable as he occasionally systolics listed in the 0000000 but this may be a machine variation will warm follow-up by primary care provider dizziness has improved he is on Norvasc 5 mg a day and hydrochlorothiazide 12.5 mg a day-recent metabolic panel showed stability.  #3-history of GERD-hs is on Protonix this is not really been an issue during his stay here.  #4 generalized weakness he has gained strength-would like to keep him longer but he is quite adamant about going home she will need close support from his nephew as well as home health and expedient follow-up of primary care provider has been cautioned about being very careful and not fall.  #5 history of vertigo again he does have Antivert as needed and this appears to be helping orthostatic blood pressures have been stable.  #6 history of weight gain suspect this is appetite related edema does not appear to be increased beyond baseline no complaints of shortness of breath or chest pain  Again patient will be going home he will be by himself but has a very supportive nephew who checks in on him frequently he will need home health support nursing support as well as PT and OT and expedient primary care follow-up-  This plan of care was discussed with Dr. Lyndel Safe via phone-  Patient is being discharged with the following home health services:  PT OT as well as home health     Patient has been  advised to f/u with their PCP in 1-2 weeks to bring them up to date on their rehab stay.  Social services at facility was responsible for arranging this appointment.  Pt was provided with a 30 day supply of prescriptions for medications and refills must be obtained from their PCP.  For controlled substances, a more limited supply may be provided adequate until PCP appointment only.  Patient will be followed by the [facility] staff has been in contact with them  about follow-up  [id number]-of note greater than 30 minutes spent in this discharge summary-greater than 50% of time spent coordinating plan of care for numerous diagnoses

## 2016-12-23 DIAGNOSIS — M4807 Spinal stenosis, lumbosacral region: Secondary | ICD-10-CM | POA: Diagnosis not present

## 2016-12-23 DIAGNOSIS — Z9181 History of falling: Secondary | ICD-10-CM | POA: Diagnosis not present

## 2016-12-23 DIAGNOSIS — G629 Polyneuropathy, unspecified: Secondary | ICD-10-CM | POA: Diagnosis not present

## 2016-12-23 DIAGNOSIS — I10 Essential (primary) hypertension: Secondary | ICD-10-CM | POA: Diagnosis not present

## 2016-12-23 DIAGNOSIS — R627 Adult failure to thrive: Secondary | ICD-10-CM | POA: Diagnosis not present

## 2016-12-23 DIAGNOSIS — M24532 Contracture, left wrist: Secondary | ICD-10-CM | POA: Diagnosis not present

## 2016-12-23 DIAGNOSIS — R600 Localized edema: Secondary | ICD-10-CM | POA: Diagnosis not present

## 2016-12-26 DIAGNOSIS — Z9181 History of falling: Secondary | ICD-10-CM | POA: Diagnosis not present

## 2016-12-26 DIAGNOSIS — M4807 Spinal stenosis, lumbosacral region: Secondary | ICD-10-CM | POA: Diagnosis not present

## 2016-12-26 DIAGNOSIS — G629 Polyneuropathy, unspecified: Secondary | ICD-10-CM | POA: Diagnosis not present

## 2016-12-26 DIAGNOSIS — R600 Localized edema: Secondary | ICD-10-CM | POA: Diagnosis not present

## 2016-12-26 DIAGNOSIS — I10 Essential (primary) hypertension: Secondary | ICD-10-CM | POA: Diagnosis not present

## 2016-12-26 DIAGNOSIS — M24532 Contracture, left wrist: Secondary | ICD-10-CM | POA: Diagnosis not present

## 2016-12-26 DIAGNOSIS — R627 Adult failure to thrive: Secondary | ICD-10-CM | POA: Diagnosis not present

## 2016-12-28 DIAGNOSIS — M24532 Contracture, left wrist: Secondary | ICD-10-CM | POA: Diagnosis not present

## 2016-12-28 DIAGNOSIS — Z9181 History of falling: Secondary | ICD-10-CM | POA: Diagnosis not present

## 2016-12-28 DIAGNOSIS — R600 Localized edema: Secondary | ICD-10-CM | POA: Diagnosis not present

## 2016-12-28 DIAGNOSIS — M4807 Spinal stenosis, lumbosacral region: Secondary | ICD-10-CM | POA: Diagnosis not present

## 2016-12-28 DIAGNOSIS — I10 Essential (primary) hypertension: Secondary | ICD-10-CM | POA: Diagnosis not present

## 2016-12-28 DIAGNOSIS — R627 Adult failure to thrive: Secondary | ICD-10-CM | POA: Diagnosis not present

## 2016-12-28 DIAGNOSIS — G629 Polyneuropathy, unspecified: Secondary | ICD-10-CM | POA: Diagnosis not present

## 2016-12-30 DIAGNOSIS — Z9181 History of falling: Secondary | ICD-10-CM | POA: Diagnosis not present

## 2016-12-30 DIAGNOSIS — I10 Essential (primary) hypertension: Secondary | ICD-10-CM | POA: Diagnosis not present

## 2016-12-30 DIAGNOSIS — G629 Polyneuropathy, unspecified: Secondary | ICD-10-CM | POA: Diagnosis not present

## 2016-12-30 DIAGNOSIS — M4807 Spinal stenosis, lumbosacral region: Secondary | ICD-10-CM | POA: Diagnosis not present

## 2016-12-30 DIAGNOSIS — R627 Adult failure to thrive: Secondary | ICD-10-CM | POA: Diagnosis not present

## 2016-12-30 DIAGNOSIS — R600 Localized edema: Secondary | ICD-10-CM | POA: Diagnosis not present

## 2016-12-30 DIAGNOSIS — M24532 Contracture, left wrist: Secondary | ICD-10-CM | POA: Diagnosis not present

## 2017-01-02 DIAGNOSIS — M24532 Contracture, left wrist: Secondary | ICD-10-CM | POA: Diagnosis not present

## 2017-01-02 DIAGNOSIS — I10 Essential (primary) hypertension: Secondary | ICD-10-CM | POA: Diagnosis not present

## 2017-01-02 DIAGNOSIS — Z9181 History of falling: Secondary | ICD-10-CM | POA: Diagnosis not present

## 2017-01-02 DIAGNOSIS — R627 Adult failure to thrive: Secondary | ICD-10-CM | POA: Diagnosis not present

## 2017-01-02 DIAGNOSIS — R600 Localized edema: Secondary | ICD-10-CM | POA: Diagnosis not present

## 2017-01-02 DIAGNOSIS — M4807 Spinal stenosis, lumbosacral region: Secondary | ICD-10-CM | POA: Diagnosis not present

## 2017-01-02 DIAGNOSIS — G629 Polyneuropathy, unspecified: Secondary | ICD-10-CM | POA: Diagnosis not present

## 2017-01-08 DIAGNOSIS — M4807 Spinal stenosis, lumbosacral region: Secondary | ICD-10-CM | POA: Diagnosis not present

## 2017-01-08 DIAGNOSIS — R627 Adult failure to thrive: Secondary | ICD-10-CM | POA: Diagnosis not present

## 2017-01-08 DIAGNOSIS — I10 Essential (primary) hypertension: Secondary | ICD-10-CM | POA: Diagnosis not present

## 2017-01-08 DIAGNOSIS — Z9181 History of falling: Secondary | ICD-10-CM | POA: Diagnosis not present

## 2017-01-08 DIAGNOSIS — M24532 Contracture, left wrist: Secondary | ICD-10-CM | POA: Diagnosis not present

## 2017-01-08 DIAGNOSIS — R600 Localized edema: Secondary | ICD-10-CM | POA: Diagnosis not present

## 2017-01-08 DIAGNOSIS — G629 Polyneuropathy, unspecified: Secondary | ICD-10-CM | POA: Diagnosis not present

## 2017-01-09 DIAGNOSIS — M24532 Contracture, left wrist: Secondary | ICD-10-CM | POA: Diagnosis not present

## 2017-01-09 DIAGNOSIS — G629 Polyneuropathy, unspecified: Secondary | ICD-10-CM | POA: Diagnosis not present

## 2017-01-09 DIAGNOSIS — R627 Adult failure to thrive: Secondary | ICD-10-CM | POA: Diagnosis not present

## 2017-01-09 DIAGNOSIS — R600 Localized edema: Secondary | ICD-10-CM | POA: Diagnosis not present

## 2017-01-09 DIAGNOSIS — Z9181 History of falling: Secondary | ICD-10-CM | POA: Diagnosis not present

## 2017-01-09 DIAGNOSIS — I10 Essential (primary) hypertension: Secondary | ICD-10-CM | POA: Diagnosis not present

## 2017-01-09 DIAGNOSIS — M4807 Spinal stenosis, lumbosacral region: Secondary | ICD-10-CM | POA: Diagnosis not present

## 2017-01-10 DIAGNOSIS — Z9181 History of falling: Secondary | ICD-10-CM | POA: Diagnosis not present

## 2017-01-10 DIAGNOSIS — M4807 Spinal stenosis, lumbosacral region: Secondary | ICD-10-CM | POA: Diagnosis not present

## 2017-01-10 DIAGNOSIS — M24532 Contracture, left wrist: Secondary | ICD-10-CM | POA: Diagnosis not present

## 2017-01-10 DIAGNOSIS — I10 Essential (primary) hypertension: Secondary | ICD-10-CM | POA: Diagnosis not present

## 2017-01-10 DIAGNOSIS — R627 Adult failure to thrive: Secondary | ICD-10-CM | POA: Diagnosis not present

## 2017-01-10 DIAGNOSIS — G629 Polyneuropathy, unspecified: Secondary | ICD-10-CM | POA: Diagnosis not present

## 2017-01-10 DIAGNOSIS — R600 Localized edema: Secondary | ICD-10-CM | POA: Diagnosis not present

## 2017-01-14 DIAGNOSIS — Z9181 History of falling: Secondary | ICD-10-CM | POA: Diagnosis not present

## 2017-01-14 DIAGNOSIS — R600 Localized edema: Secondary | ICD-10-CM | POA: Diagnosis not present

## 2017-01-14 DIAGNOSIS — M24532 Contracture, left wrist: Secondary | ICD-10-CM | POA: Diagnosis not present

## 2017-01-14 DIAGNOSIS — G629 Polyneuropathy, unspecified: Secondary | ICD-10-CM | POA: Diagnosis not present

## 2017-01-14 DIAGNOSIS — M4807 Spinal stenosis, lumbosacral region: Secondary | ICD-10-CM | POA: Diagnosis not present

## 2017-01-14 DIAGNOSIS — I10 Essential (primary) hypertension: Secondary | ICD-10-CM | POA: Diagnosis not present

## 2017-01-14 DIAGNOSIS — R627 Adult failure to thrive: Secondary | ICD-10-CM | POA: Diagnosis not present

## 2017-01-15 DIAGNOSIS — I10 Essential (primary) hypertension: Secondary | ICD-10-CM | POA: Diagnosis not present

## 2017-01-15 DIAGNOSIS — Z9181 History of falling: Secondary | ICD-10-CM | POA: Diagnosis not present

## 2017-01-15 DIAGNOSIS — R600 Localized edema: Secondary | ICD-10-CM | POA: Diagnosis not present

## 2017-01-15 DIAGNOSIS — R627 Adult failure to thrive: Secondary | ICD-10-CM | POA: Diagnosis not present

## 2017-01-15 DIAGNOSIS — M24532 Contracture, left wrist: Secondary | ICD-10-CM | POA: Diagnosis not present

## 2017-01-15 DIAGNOSIS — G629 Polyneuropathy, unspecified: Secondary | ICD-10-CM | POA: Diagnosis not present

## 2017-01-15 DIAGNOSIS — M4807 Spinal stenosis, lumbosacral region: Secondary | ICD-10-CM | POA: Diagnosis not present

## 2017-01-16 DIAGNOSIS — R627 Adult failure to thrive: Secondary | ICD-10-CM | POA: Diagnosis not present

## 2017-01-16 DIAGNOSIS — I10 Essential (primary) hypertension: Secondary | ICD-10-CM | POA: Diagnosis not present

## 2017-01-16 DIAGNOSIS — G629 Polyneuropathy, unspecified: Secondary | ICD-10-CM | POA: Diagnosis not present

## 2017-01-16 DIAGNOSIS — M4807 Spinal stenosis, lumbosacral region: Secondary | ICD-10-CM | POA: Diagnosis not present

## 2017-01-16 DIAGNOSIS — R600 Localized edema: Secondary | ICD-10-CM | POA: Diagnosis not present

## 2017-01-16 DIAGNOSIS — Z9181 History of falling: Secondary | ICD-10-CM | POA: Diagnosis not present

## 2017-01-16 DIAGNOSIS — M24532 Contracture, left wrist: Secondary | ICD-10-CM | POA: Diagnosis not present

## 2017-01-17 DIAGNOSIS — R627 Adult failure to thrive: Secondary | ICD-10-CM | POA: Diagnosis not present

## 2017-01-17 DIAGNOSIS — I10 Essential (primary) hypertension: Secondary | ICD-10-CM | POA: Diagnosis not present

## 2017-01-17 DIAGNOSIS — Z9181 History of falling: Secondary | ICD-10-CM | POA: Diagnosis not present

## 2017-01-17 DIAGNOSIS — M24532 Contracture, left wrist: Secondary | ICD-10-CM | POA: Diagnosis not present

## 2017-01-17 DIAGNOSIS — M4807 Spinal stenosis, lumbosacral region: Secondary | ICD-10-CM | POA: Diagnosis not present

## 2017-01-17 DIAGNOSIS — G629 Polyneuropathy, unspecified: Secondary | ICD-10-CM | POA: Diagnosis not present

## 2017-01-17 DIAGNOSIS — R600 Localized edema: Secondary | ICD-10-CM | POA: Diagnosis not present

## 2017-01-22 DIAGNOSIS — M24532 Contracture, left wrist: Secondary | ICD-10-CM | POA: Diagnosis not present

## 2017-01-22 DIAGNOSIS — G629 Polyneuropathy, unspecified: Secondary | ICD-10-CM | POA: Diagnosis not present

## 2017-01-22 DIAGNOSIS — R627 Adult failure to thrive: Secondary | ICD-10-CM | POA: Diagnosis not present

## 2017-01-22 DIAGNOSIS — M4807 Spinal stenosis, lumbosacral region: Secondary | ICD-10-CM | POA: Diagnosis not present

## 2017-01-22 DIAGNOSIS — Z9181 History of falling: Secondary | ICD-10-CM | POA: Diagnosis not present

## 2017-01-22 DIAGNOSIS — I10 Essential (primary) hypertension: Secondary | ICD-10-CM | POA: Diagnosis not present

## 2017-01-22 DIAGNOSIS — R600 Localized edema: Secondary | ICD-10-CM | POA: Diagnosis not present

## 2017-01-24 DIAGNOSIS — M24532 Contracture, left wrist: Secondary | ICD-10-CM | POA: Diagnosis not present

## 2017-01-24 DIAGNOSIS — G629 Polyneuropathy, unspecified: Secondary | ICD-10-CM | POA: Diagnosis not present

## 2017-01-24 DIAGNOSIS — Z9181 History of falling: Secondary | ICD-10-CM | POA: Diagnosis not present

## 2017-01-24 DIAGNOSIS — M4807 Spinal stenosis, lumbosacral region: Secondary | ICD-10-CM | POA: Diagnosis not present

## 2017-01-24 DIAGNOSIS — R600 Localized edema: Secondary | ICD-10-CM | POA: Diagnosis not present

## 2017-01-24 DIAGNOSIS — R627 Adult failure to thrive: Secondary | ICD-10-CM | POA: Diagnosis not present

## 2017-01-24 DIAGNOSIS — I10 Essential (primary) hypertension: Secondary | ICD-10-CM | POA: Diagnosis not present

## 2017-01-29 DIAGNOSIS — M4807 Spinal stenosis, lumbosacral region: Secondary | ICD-10-CM | POA: Diagnosis not present

## 2017-01-29 DIAGNOSIS — Z9181 History of falling: Secondary | ICD-10-CM | POA: Diagnosis not present

## 2017-01-29 DIAGNOSIS — R627 Adult failure to thrive: Secondary | ICD-10-CM | POA: Diagnosis not present

## 2017-01-29 DIAGNOSIS — M24532 Contracture, left wrist: Secondary | ICD-10-CM | POA: Diagnosis not present

## 2017-01-29 DIAGNOSIS — G629 Polyneuropathy, unspecified: Secondary | ICD-10-CM | POA: Diagnosis not present

## 2017-01-29 DIAGNOSIS — R600 Localized edema: Secondary | ICD-10-CM | POA: Diagnosis not present

## 2017-01-29 DIAGNOSIS — I10 Essential (primary) hypertension: Secondary | ICD-10-CM | POA: Diagnosis not present

## 2017-01-30 DIAGNOSIS — R600 Localized edema: Secondary | ICD-10-CM | POA: Diagnosis not present

## 2017-01-30 DIAGNOSIS — G629 Polyneuropathy, unspecified: Secondary | ICD-10-CM | POA: Diagnosis not present

## 2017-01-30 DIAGNOSIS — Z9181 History of falling: Secondary | ICD-10-CM | POA: Diagnosis not present

## 2017-01-30 DIAGNOSIS — I10 Essential (primary) hypertension: Secondary | ICD-10-CM | POA: Diagnosis not present

## 2017-01-30 DIAGNOSIS — M4807 Spinal stenosis, lumbosacral region: Secondary | ICD-10-CM | POA: Diagnosis not present

## 2017-01-30 DIAGNOSIS — M24532 Contracture, left wrist: Secondary | ICD-10-CM | POA: Diagnosis not present

## 2017-01-30 DIAGNOSIS — R627 Adult failure to thrive: Secondary | ICD-10-CM | POA: Diagnosis not present

## 2017-01-31 DIAGNOSIS — Z9181 History of falling: Secondary | ICD-10-CM | POA: Diagnosis not present

## 2017-01-31 DIAGNOSIS — R600 Localized edema: Secondary | ICD-10-CM | POA: Diagnosis not present

## 2017-01-31 DIAGNOSIS — M4807 Spinal stenosis, lumbosacral region: Secondary | ICD-10-CM | POA: Diagnosis not present

## 2017-01-31 DIAGNOSIS — M24532 Contracture, left wrist: Secondary | ICD-10-CM | POA: Diagnosis not present

## 2017-01-31 DIAGNOSIS — I10 Essential (primary) hypertension: Secondary | ICD-10-CM | POA: Diagnosis not present

## 2017-01-31 DIAGNOSIS — G629 Polyneuropathy, unspecified: Secondary | ICD-10-CM | POA: Diagnosis not present

## 2017-01-31 DIAGNOSIS — R627 Adult failure to thrive: Secondary | ICD-10-CM | POA: Diagnosis not present

## 2017-02-05 DIAGNOSIS — Z9181 History of falling: Secondary | ICD-10-CM | POA: Diagnosis not present

## 2017-02-05 DIAGNOSIS — R627 Adult failure to thrive: Secondary | ICD-10-CM | POA: Diagnosis not present

## 2017-02-05 DIAGNOSIS — G629 Polyneuropathy, unspecified: Secondary | ICD-10-CM | POA: Diagnosis not present

## 2017-02-05 DIAGNOSIS — M4807 Spinal stenosis, lumbosacral region: Secondary | ICD-10-CM | POA: Diagnosis not present

## 2017-02-05 DIAGNOSIS — M24532 Contracture, left wrist: Secondary | ICD-10-CM | POA: Diagnosis not present

## 2017-02-05 DIAGNOSIS — I10 Essential (primary) hypertension: Secondary | ICD-10-CM | POA: Diagnosis not present

## 2017-02-05 DIAGNOSIS — R600 Localized edema: Secondary | ICD-10-CM | POA: Diagnosis not present

## 2017-02-07 DIAGNOSIS — G629 Polyneuropathy, unspecified: Secondary | ICD-10-CM | POA: Diagnosis not present

## 2017-02-07 DIAGNOSIS — Z9181 History of falling: Secondary | ICD-10-CM | POA: Diagnosis not present

## 2017-02-07 DIAGNOSIS — R600 Localized edema: Secondary | ICD-10-CM | POA: Diagnosis not present

## 2017-02-07 DIAGNOSIS — M24532 Contracture, left wrist: Secondary | ICD-10-CM | POA: Diagnosis not present

## 2017-02-07 DIAGNOSIS — I10 Essential (primary) hypertension: Secondary | ICD-10-CM | POA: Diagnosis not present

## 2017-02-07 DIAGNOSIS — M4807 Spinal stenosis, lumbosacral region: Secondary | ICD-10-CM | POA: Diagnosis not present

## 2017-02-07 DIAGNOSIS — R627 Adult failure to thrive: Secondary | ICD-10-CM | POA: Diagnosis not present

## 2017-02-12 DIAGNOSIS — M4807 Spinal stenosis, lumbosacral region: Secondary | ICD-10-CM | POA: Diagnosis not present

## 2017-02-12 DIAGNOSIS — I10 Essential (primary) hypertension: Secondary | ICD-10-CM | POA: Diagnosis not present

## 2017-02-12 DIAGNOSIS — R627 Adult failure to thrive: Secondary | ICD-10-CM | POA: Diagnosis not present

## 2017-02-12 DIAGNOSIS — Z9181 History of falling: Secondary | ICD-10-CM | POA: Diagnosis not present

## 2017-02-12 DIAGNOSIS — M24532 Contracture, left wrist: Secondary | ICD-10-CM | POA: Diagnosis not present

## 2017-02-12 DIAGNOSIS — R600 Localized edema: Secondary | ICD-10-CM | POA: Diagnosis not present

## 2017-02-12 DIAGNOSIS — G629 Polyneuropathy, unspecified: Secondary | ICD-10-CM | POA: Diagnosis not present

## 2017-02-14 DIAGNOSIS — G629 Polyneuropathy, unspecified: Secondary | ICD-10-CM | POA: Diagnosis not present

## 2017-02-14 DIAGNOSIS — Z9181 History of falling: Secondary | ICD-10-CM | POA: Diagnosis not present

## 2017-02-14 DIAGNOSIS — R627 Adult failure to thrive: Secondary | ICD-10-CM | POA: Diagnosis not present

## 2017-02-14 DIAGNOSIS — R600 Localized edema: Secondary | ICD-10-CM | POA: Diagnosis not present

## 2017-02-14 DIAGNOSIS — M4807 Spinal stenosis, lumbosacral region: Secondary | ICD-10-CM | POA: Diagnosis not present

## 2017-02-14 DIAGNOSIS — I10 Essential (primary) hypertension: Secondary | ICD-10-CM | POA: Diagnosis not present

## 2017-02-14 DIAGNOSIS — M24532 Contracture, left wrist: Secondary | ICD-10-CM | POA: Diagnosis not present

## 2017-02-19 DIAGNOSIS — R600 Localized edema: Secondary | ICD-10-CM | POA: Diagnosis not present

## 2017-02-19 DIAGNOSIS — I10 Essential (primary) hypertension: Secondary | ICD-10-CM | POA: Diagnosis not present

## 2017-02-19 DIAGNOSIS — M24532 Contracture, left wrist: Secondary | ICD-10-CM | POA: Diagnosis not present

## 2017-02-19 DIAGNOSIS — Z9181 History of falling: Secondary | ICD-10-CM | POA: Diagnosis not present

## 2017-02-19 DIAGNOSIS — G629 Polyneuropathy, unspecified: Secondary | ICD-10-CM | POA: Diagnosis not present

## 2017-02-19 DIAGNOSIS — R627 Adult failure to thrive: Secondary | ICD-10-CM | POA: Diagnosis not present

## 2017-02-19 DIAGNOSIS — M4807 Spinal stenosis, lumbosacral region: Secondary | ICD-10-CM | POA: Diagnosis not present

## 2017-03-16 ENCOUNTER — Emergency Department (HOSPITAL_COMMUNITY): Payer: Medicare Other

## 2017-03-16 ENCOUNTER — Encounter (HOSPITAL_COMMUNITY): Payer: Self-pay | Admitting: Emergency Medicine

## 2017-03-16 ENCOUNTER — Emergency Department (HOSPITAL_COMMUNITY)
Admission: EM | Admit: 2017-03-16 | Discharge: 2017-03-17 | Disposition: A | Discharge status: Other Acute Inpatient Hospital | Payer: Medicare Other | Attending: Emergency Medicine | Admitting: Emergency Medicine

## 2017-03-16 DIAGNOSIS — F29 Unspecified psychosis not due to a substance or known physiological condition: Secondary | ICD-10-CM | POA: Diagnosis not present

## 2017-03-16 DIAGNOSIS — Z79899 Other long term (current) drug therapy: Secondary | ICD-10-CM | POA: Insufficient documentation

## 2017-03-16 DIAGNOSIS — I10 Essential (primary) hypertension: Secondary | ICD-10-CM | POA: Insufficient documentation

## 2017-03-16 DIAGNOSIS — M1990 Unspecified osteoarthritis, unspecified site: Secondary | ICD-10-CM | POA: Diagnosis not present

## 2017-03-16 DIAGNOSIS — Z8546 Personal history of malignant neoplasm of prostate: Secondary | ICD-10-CM | POA: Diagnosis not present

## 2017-03-16 DIAGNOSIS — R441 Visual hallucinations: Secondary | ICD-10-CM | POA: Diagnosis not present

## 2017-03-16 DIAGNOSIS — R609 Edema, unspecified: Secondary | ICD-10-CM | POA: Insufficient documentation

## 2017-03-16 DIAGNOSIS — F0391 Unspecified dementia with behavioral disturbance: Secondary | ICD-10-CM | POA: Diagnosis not present

## 2017-03-16 DIAGNOSIS — R443 Hallucinations, unspecified: Secondary | ICD-10-CM

## 2017-03-16 DIAGNOSIS — Z87891 Personal history of nicotine dependence: Secondary | ICD-10-CM | POA: Diagnosis not present

## 2017-03-16 DIAGNOSIS — F0392 Unspecified dementia, unspecified severity, with psychotic disturbance: Secondary | ICD-10-CM | POA: Diagnosis present

## 2017-03-16 LAB — URINALYSIS, ROUTINE W REFLEX MICROSCOPIC
Bilirubin Urine: NEGATIVE
Glucose, UA: NEGATIVE mg/dL
Hgb urine dipstick: NEGATIVE
KETONES UR: NEGATIVE mg/dL
LEUKOCYTES UA: NEGATIVE
NITRITE: NEGATIVE
PH: 6 (ref 5.0–8.0)
Protein, ur: NEGATIVE mg/dL
Specific Gravity, Urine: 1.021 (ref 1.005–1.030)

## 2017-03-16 LAB — RAPID URINE DRUG SCREEN, HOSP PERFORMED
Amphetamines: NOT DETECTED
BENZODIAZEPINES: NOT DETECTED
Barbiturates: NOT DETECTED
Cocaine: NOT DETECTED
Opiates: NOT DETECTED
Tetrahydrocannabinol: NOT DETECTED

## 2017-03-16 LAB — COMPREHENSIVE METABOLIC PANEL
ALBUMIN: 4 g/dL (ref 3.5–5.0)
ALK PHOS: 55 U/L (ref 38–126)
ALT: 11 U/L — AB (ref 17–63)
ANION GAP: 6 (ref 5–15)
AST: 18 U/L (ref 15–41)
BUN: 15 mg/dL (ref 6–20)
CALCIUM: 9.3 mg/dL (ref 8.9–10.3)
CHLORIDE: 108 mmol/L (ref 101–111)
CO2: 27 mmol/L (ref 22–32)
Creatinine, Ser: 0.92 mg/dL (ref 0.61–1.24)
GFR calc non Af Amer: >60 mL/min (ref >60)
Glucose, Bld: 105 mg/dL — ABNORMAL HIGH (ref 65–99)
Potassium: 3.8 mmol/L (ref 3.5–5.1)
SODIUM: 141 mmol/L (ref 135–145)
Total Bilirubin: 0.4 mg/dL (ref 0.3–1.2)
Total Protein: 6.8 g/dL (ref 6.5–8.1)

## 2017-03-16 LAB — ACETAMINOPHEN LEVEL

## 2017-03-16 LAB — CBC WITH DIFFERENTIAL/PLATELET
BASOS ABS: 0 10*3/uL (ref 0.0–0.1)
Basophils Relative: 0 %
EOS ABS: 0.2 10*3/uL (ref 0.0–0.7)
EOS PCT: 4 %
HCT: 36 % — ABNORMAL LOW (ref 39.0–52.0)
HEMOGLOBIN: 11.7 g/dL — AB (ref 13.0–17.0)
LYMPHS ABS: 1.6 10*3/uL (ref 0.7–4.0)
Lymphocytes Relative: 34 %
MCH: 27.2 pg (ref 26.0–34.0)
MCHC: 32.5 g/dL (ref 30.0–36.0)
MCV: 83.7 fL (ref 78.0–100.0)
Monocytes Absolute: 0.3 10*3/uL (ref 0.1–1.0)
Monocytes Relative: 6 %
NEUTROS PCT: 56 %
Neutro Abs: 2.7 10*3/uL (ref 1.7–7.7)
PLATELETS: 267 10*3/uL (ref 150–400)
RBC: 4.3 MIL/uL (ref 4.22–5.81)
RDW: 15.2 % (ref 11.5–15.5)
WBC: 4.8 10*3/uL (ref 4.0–10.5)

## 2017-03-16 LAB — AMMONIA: AMMONIA: 37 umol/L — AB (ref 9–35)

## 2017-03-16 LAB — SALICYLATE LEVEL

## 2017-03-16 LAB — ETHANOL

## 2017-03-16 MED ORDER — DOCUSATE SODIUM 100 MG PO CAPS
ORAL_CAPSULE | ORAL | Status: AC
  Administered 2017-03-17: 100 mg
  Filled 2017-03-16: qty 1

## 2017-03-16 MED ORDER — PANTOPRAZOLE SODIUM 40 MG PO TBEC
40.0000 mg | DELAYED_RELEASE_TABLET | Freq: Two times a day (BID) | ORAL | Status: DC
  Administered 2017-03-16 – 2017-03-17 (×2): 40 mg via ORAL
  Filled 2017-03-16: qty 1

## 2017-03-16 MED ORDER — HYDROCHLOROTHIAZIDE 12.5 MG PO CAPS
25.0000 mg | ORAL_CAPSULE | Freq: Every day | ORAL | Status: DC
  Administered 2017-03-17: 25 mg via ORAL
  Filled 2017-03-16: qty 2

## 2017-03-16 MED ORDER — DOCUSATE SODIUM 100 MG PO CAPS
100.0000 mg | ORAL_CAPSULE | Freq: Two times a day (BID) | ORAL | Status: DC
  Administered 2017-03-16 – 2017-03-17 (×2): 100 mg via ORAL
  Filled 2017-03-16: qty 1

## 2017-03-16 MED ORDER — LISINOPRIL 10 MG PO TABS
10.0000 mg | ORAL_TABLET | Freq: Once | ORAL | Status: AC
  Administered 2017-03-16: 10 mg via ORAL
  Filled 2017-03-16 (×3): qty 1

## 2017-03-16 MED ORDER — QUETIAPINE FUMARATE 25 MG PO TABS
ORAL_TABLET | ORAL | Status: AC
  Filled 2017-03-16: qty 1

## 2017-03-16 MED ORDER — TRAMADOL HCL 50 MG PO TABS
ORAL_TABLET | ORAL | Status: AC
Start: 2017-03-16 — End: 2017-03-17
  Filled 2017-03-16: qty 1

## 2017-03-16 MED ORDER — QUETIAPINE FUMARATE 25 MG PO TABS
25.0000 mg | ORAL_TABLET | Freq: Every day | ORAL | Status: DC
  Administered 2017-03-16: 25 mg via ORAL

## 2017-03-16 MED ORDER — TRAMADOL HCL 50 MG PO TABS
50.0000 mg | ORAL_TABLET | Freq: Two times a day (BID) | ORAL | Status: DC
  Administered 2017-03-16: 50 mg via ORAL

## 2017-03-16 MED ORDER — PANTOPRAZOLE SODIUM 40 MG PO TBEC
DELAYED_RELEASE_TABLET | ORAL | Status: AC
  Filled 2017-03-16: qty 1

## 2017-03-16 NOTE — ED Provider Notes (Signed)
[facility] DEPT Provider Note   CSN: [id number] Arrival date & time: 03/16/17  7939     History   Chief Complaint Chief Complaint  Patient presents with  . IVC    HPI Evan Charles is a 71 y.o. male. His chief complaint is "hallucinations, and my knees hurt".  HPI: Patient presents from home under IVC from mobile crisis.  71 year old male. History of previous hospitalizations for encephalopathy. He has history of dementia. He does live independently. He states that he has been having hallucinations for over 4 years. Sometimes they're worse than others.  He describes them as a little people in the house that where this made of wood so she tries to punch or kick them, it doesn't hurt them. They do not talk. He feels like they messed with things in his house including moving things stealing things and adjusting his electronics.  He states that he is aware that these are very likely not real he states that he tells them all the time that they're not real. They become more bothersome to him. He states this is worse the more time he spends alone at home. He does have family that checks on him occasionally but not recently or not often.  He states he was previous and a medication that helped but he stopped taking it.   Patient also has a history of chronic knee pain and lower extremity edema. Interestingly he is still on amlodipine. He does not take Lasix but does take 12.5 hydrochlorothiazide daily. Denies shortness of breath. Denies fever. Denies pain other than his knees.  He called 911 today. He states that he was hoping square something would happen with them that they could be arrested her taken out". Mobile crisis was contacted and evaluated the patient at the house and is placed him under IVC  Past Medical History:  Diagnosis Date  . Back pain   . Chronic mental illness   . Colitis   . Dementia   . GERD (gastroesophageal reflux disease)   . Hypertension   . Neuropathy      " MY HANDS "  . Prostate cancer [facility])     Patient Active Problem List   Diagnosis Date Noted  . Impaired mobility and ADLs   . Hypokalemia 11/05/2016  . Dementia 09/12/2016  . Acute delirium 09/08/2016  . Rhabdomyolysis 09/08/2016  . Normocytic anemia 09/08/2016  . Altered mental status 09/08/2016  . Fall at home   . UTI (lower urinary tract infection) 07/25/2016  . Mild cognitive impairment 07/25/2016  . 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 Immobility 03/20/2016  . Generalized weakness 03/20/2016  . Failure to thrive in adult 03/20/2016  . Neuropathy   . Essential hypertension   . Physical deconditioning   . 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 GERD without esophagitis 12/19/2015  . Bilateral lower extremity edema 12/19/2015  . Intractable low back pain 02/14/2015  . Difficulty walking 02/14/2015  . Prostate cancer ([place]) 02/14/2015  . H/O: upper GI bleed   . Chronic back pain   . Visual hallucinations   . Psychoses     Past Surgical History:  Procedure Laterality Date  . KNEE ARTHROSCOPY    . LAMINECTOMY    . PROSTATECTOMY         Home Medications    Prior to Admission medications   Medication Sig Start Date End Date Taking? Authorizing Provider  amLODipine (NORVASC) 5 MG tablet Take 1 tablet (5 mg  total) by mouth daily. 09/14/16  Yes Barton Dubois, MD  docusate sodium (COLACE) 100 MG capsule Take 1 capsule (100 mg total) by mouth 2 (two) times daily. 03/22/16  Yes Rai, Ripudeep K, MD  hydrochlorothiazide (MICROZIDE) 12.5 MG capsule Take 12.5 mg by mouth daily. 08/21/16  Yes [provider]  pantoprazole (PROTONIX) 40 MG tablet Take 1 tablet (40 mg total) by mouth 2 (two) times daily. 12/16/15  Yes Leo Grosser, MD  QUEtiapine (SEROQUEL) 25 MG tablet Take 1 tablet (25 mg total) by mouth at bedtime. 09/13/16  Yes Barton Dubois, MD  traMADol Veatrice Bourbon) 50 MG tablet Take one tablet by mouth every 6 hours as needed for pain 11/19/16   Yes Lauree Chandler, NP    Family History Family History  Problem Relation Age of Onset  . Cancer Brother   . Cancer Maternal Grandmother     Social History Social History  Substance Use Topics  . Smoking status: Former Smoker    Types: Cigarettes  . Smokeless tobacco: Never Used  . Alcohol use No     Allergies   Tomato   Review of Systems Review of Systems  Constitutional: Negative for appetite change, chills, diaphoresis, fatigue and fever.  HENT: Negative for mouth sores, sore throat and trouble swallowing.   Eyes: Negative for visual disturbance.  Respiratory: Negative for cough, chest tightness, shortness of breath and wheezing.   Cardiovascular: Positive for leg swelling. Negative for chest pain.  Gastrointestinal: Negative for abdominal distention, abdominal pain, diarrhea, nausea and vomiting.  Endocrine: Negative for polydipsia, polyphagia and polyuria.  Genitourinary: Negative for dysuria, frequency and hematuria.  Musculoskeletal: Positive for arthralgias. Negative for gait problem.  Skin: Negative for color change, pallor and rash.  Neurological: Negative for dizziness, syncope, light-headedness and headaches.  Hematological: Does not bruise/bleed easily.  Psychiatric/Behavioral: Positive for hallucinations. Negative for behavioral problems and confusion.     Physical Exam Updated Vital Signs BP (!) 140/94 (BP Location: Right Arm)   Pulse 82   Temp 98.3 F (36.8 C) (Oral)   Resp (!) 21   Ht 5\' 9"  (1.753 m)   Wt 185 lb (83.9 kg)   SpO2 97%   BMI 27.32 kg/m   Physical Exam  Constitutional: He appears well-developed and well-nourished. No distress.  HENT:  Head: Normocephalic.  Eyes: Conjunctivae are normal. Pupils are equal, round, and reactive to light. No scleral icterus.  Neck: Normal range of motion. Neck supple. No thyromegaly present.  Cardiovascular: Normal rate and regular rhythm.  Exam reveals no gallop and no friction rub.   No  murmur heard. Pulmonary/Chest: Effort normal and breath sounds normal. No respiratory distress. He has no wheezes. He has no rales.  Abdominal: Soft. Bowel sounds are normal. He exhibits no distension. There is no tenderness. There is no rebound.  Musculoskeletal: Normal range of motion.  Prominence of the bone around the knee. No palpable effusion. He has 1-2+ symmetric lower external edema. No erythema warmth or tenderness.  Neurological: He is alert.  Skin: Skin is warm and dry. No rash noted.  Psychiatric: He has a normal mood and affect. His behavior is normal.     ED Treatments / Results  Labs (all labs ordered are listed, but only abnormal results are displayed) Labs Reviewed  CBC WITH DIFFERENTIAL/PLATELET - Abnormal; Notable for the following:       Result Value   Hemoglobin 11.7 (*)    HCT 36.0 (*)    All other components within normal  limits  COMPREHENSIVE METABOLIC PANEL - Abnormal; Notable for the following:    Glucose, Bld 105 (*)    ALT 11 (*)    All other components within normal limits  AMMONIA - Abnormal; Notable for the following:    Ammonia 37 (*)    All other components within normal limits  ACETAMINOPHEN LEVEL - Abnormal; Notable for the following:    Acetaminophen (Tylenol), Serum <10 (*)    All other components within normal limits  URINALYSIS, ROUTINE W REFLEX MICROSCOPIC  SALICYLATE LEVEL  RAPID URINE DRUG SCREEN, HOSP PERFORMED  ETHANOL    EKG  EKG Interpretation None       Radiology Ct Head Wo Contrast  Result Date: 03/16/2017 CLINICAL DATA:  Mental status change. EXAM: CT HEAD WITHOUT CONTRAST TECHNIQUE: Contiguous axial images were obtained from the base of the skull through the vertex without intravenous contrast. COMPARISON:  November 02, 2016 FINDINGS: Brain: No subdural, epidural, or subarachnoid hemorrhage. Cerebellum, brainstem, and basal cisterns are normal. Ventricles and sulci are unchanged unremarkable. No mass, mass effect, or  midline shift. No acute cortical ischemia or infarct. Vascular: No hyperdense vessel or unexpected calcification. Skull: Normal. Negative for fracture or focal lesion. Sinuses/Orbits: No acute finding. Other: None. IMPRESSION: No cause for acute symptoms identified. No acute intracranial abnormality identified. Electronically Signed   By: Dorise Bullion III M.D   On: 03/16/2017 20:10   Dg Chest Port 1 View  Result Date: 03/16/2017 CLINICAL DATA:  Lower extremity edema.  Hallucinations.  Dementia. EXAM: PORTABLE CHEST 1 VIEW COMPARISON:  11/02/2016 FINDINGS: Patient rotated left. Chin overlies the upper left apex minimally. Midline trachea. Normal heart size. No pleural effusion or pneumothorax. Mild volume loss at the left lung base with elevation of the left hemidiaphragm, similar. IMPRESSION: Mild left base scarring and volume loss.  No acute findings. Electronically Signed   By: Abigail Miyamoto M.D.   On: 03/16/2017 19:05    Procedures Procedures (including critical care time)  Medications Ordered in ED Medications  docusate sodium (COLACE) capsule 100 mg (not administered)  hydrochlorothiazide (MICROZIDE) capsule 25 mg (not administered)  pantoprazole (PROTONIX) EC tablet 40 mg (not administered)  QUEtiapine (SEROQUEL) tablet 25 mg (not administered)  traMADol (ULTRAM) tablet 50 mg (not administered)  lisinopril (PRINIVIL,ZESTRIL) tablet 10 mg (not administered)     Initial Impression / Assessment and Plan / ED Course  I have reviewed the triage vital signs and the nursing notes.  Pertinent labs & imaging results that were available during my care of the patient were reviewed by me and considered in my medical decision making (see chart for details).    I discussed with him meeting with behavioral health staff Re: Is hallucinations and that hospitalization may be required. Plan medical evaluation with liver and kidney function, head CT, chest x-ray urine to evaluate for/rule out organic  cause. 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  Final Clinical Impressions(s) / ED Diagnoses   Final diagnoses:  Hallucinations  Edema, unspecified type  Arthritis    Mild elevation of his pneumonia. I do not feel the sign out to be contributing. Patient does have some insight as to his hallucinations. Otherwise medically no concerns. We'll stop amlodipine which may be contributing to his dependent edema. We'll increase his heart score thiazide and had low-dose lisinopril. Will be reevaluated by psychiatrist in the morning. Patient had previously done well when he was getting home health for medicine management. This had  been arranged to the [facility]. If felt to be appropriate for home treatment would need assistance setting this back up  New Prescriptions New Prescriptions   No medications on file     Tanna Furry, MD 03/16/17 2209

## 2017-03-16 NOTE — ED Triage Notes (Signed)
Per IVC paperwork, danger to self-delusional, paranoid-thinks people are out to get him-states he has been out of his psych meds-states he is also having LE edema-states he is out of his lasix-pleasant, cooperative

## 2017-03-16 NOTE — ED Notes (Signed)
Bed: WU98 Expected date:  Expected time:  Means of arrival:  Comments: Hallucinations; IVC

## 2017-03-16 NOTE — BH Assessment (Addendum)
Tele Assessment Note   Evan Charles is an 71 y.o. male.  -Clinician reviewed note by Dr. Jeneen Rinks.  71 year old male. History of previous hospitalizations for encephalopathy. He has history of dementia. He does live independently. He states that he has been having hallucinations for over 4 years. Sometimes they're worse than others. He describes them as a little people in the house that where this made of wood so she tries to punch or kick them, it doesn't hurt them. They do not talk. He feels like they messed with things in his house including moving things stealing things and adjusting his electronics.He states that he is aware that these are very likely not real he states that he tells them all the time that they're not real. They become more bothersome to him. He states this is worse the more time he spends alone at home. He does have family that checks on him occasionally but not recently or not often. He states he was previous and a medication that helped but he stopped taking it. Patient also has a history of chronic knee pain and lower extremity edema. Interestingly he is still on amlodipine. He does not take Lasix but does take 12.5 hydrochlorothiazide daily. Denies shortness of breath. Denies fever. Denies pain other than his knees. He called 911 today. He states that he was hoping square something would happen with them that they could be arrested her taken out". Mobile crisis was contacted and evaluated the patient at the house and is placed him under IVC.  Patient had called [facility] because he wanted to talk with someone about his hallucinations and see if they could do anything about them.  Patient says that he got tired of the hallucinations.  He says they are people that come into his house and want to steal his suits to sell or they want to take away his home from him.  He says that they always want to take from him.  Even though they do not speak to him, he knows what  they are doing.  Patient describes these hallucinations as being like ordinary people and some "are different."  He did not mention any horrific type hallucinations to this clinician.    Patient lives by himself.  He says he used to have a home health nurse that helped with managing his medications.  He does not know why this stopped.  Patient says he has been off his meds for about a week.  Patient has poor memory for what his medications are.  Patient says that he would be able to remember them if someone told him what they were.  Patient denies any SI, or auditory hallucinations.  Patient would like to harm these hallucinations.  He told Dr. Jeneen Rinks that he would like "to have them all arrested and taken away."  At the same time, patient appears to understand that these are not real.    Patient lives alone.  He says he is independent with getting into tub, he has a walk-in tub.  Patient says he uses a w/c, cane and a walker in his home.  Patient says he can use stairs as long as there are rails to grip.  Patient says he can cook.  He has a nephew that visits him daily.  The nephew is a veteran also and takes patient to his appointments at the [facility] in [place].  Patient has daughters but they call him but seldom visit he says.  Patient denies any ETOH or illicit drug use.  Patient has been to the [facility] in [place] before for inpatient care.  Patient is followed by a psychiatrist at [facility] in [place].    -Clinician discussed patient care with Dr. Jeneen Rinks.  Clinician told him that patient would benefit from [place] following up with patient's case worker at the [facility] in [place] to see what the medications are for sure and seeing if home health nursing could be re-established for patient.  Dr. Jeneen Rinks has completed the 1st Opinion on the IVC.  Clinician talked also with Lindon Romp, FNP who recommends inpatient care for stabilization.   Diagnosis: MDD recurrent w/ psychotic features  Past Medical History:   Past Medical History:  Diagnosis Date  . Back pain   . Chronic mental illness   . Colitis   . Dementia   . GERD (gastroesophageal reflux disease)   . Hypertension   . Neuropathy    " MY HANDS "  . Prostate cancer [facility])     Past Surgical History:  Procedure Laterality Date  . KNEE ARTHROSCOPY    . LAMINECTOMY    . PROSTATECTOMY      Family History:  Family History  Problem Relation Age of Onset  . Cancer Brother   . Cancer Maternal Grandmother     Social History:  reports that he has quit smoking. His smoking use included Cigarettes. He has never used smokeless tobacco. He reports that he does not drink alcohol or use drugs.  Additional Social History:  Alcohol / Drug Use Pain Medications: Pt has been off meds for about a week.  He isn't sure what he was taking "I can't pronounce it but would know it if he heard it." Prescriptions: Pt has been off meds for a week.   Over the Counter: Vitamins History of alcohol / drug use?: No history of alcohol / drug abuse  CIWA: CIWA-Ar BP: (!) 140/94 Pulse Rate: 82 COWS:    PATIENT STRENGTHS: (choose at least two) Ability for insight Average or above average intelligence Capable of independent living Communication skills Supportive family/friends  Allergies:  Allergies  Allergen Reactions  . Tomato Itching and Rash    Home Medications:  (Not in a hospital admission)  OB/GYN Status:  No LMP for male patient.  General Assessment Data [facility] of Assessment: [facility] TTS Assessment: In system Is this a Tele or Face-to-Face Assessment?: Face-to-Face Is this an Initial Assessment or a Re-assessment for this encounter?: Initial Assessment Marital status: Divorced Is patient pregnant?: No Pregnancy Status: No Living Arrangements: Alone (A nephew checks on him.  Meals on wheels daily.) Can pt return to current living arrangement?: Yes Admission Status: Involuntary Is patient capable of signing voluntary admission?:  No Referral Source: Self/Family/Friend (Pt called [facility] and they completed IVC papers.) Insurance type: [facility]     Crisis Care Plan Living Arrangements: Alone (A nephew checks on him.  Meals on wheels daily.) Name of Psychiatrist: [facility] in [place] Name of Therapist: Has a case Freight forwarder at [facility] in [place]  Education Status Is patient currently in school?: No Highest grade of school patient has completed: GED  Risk to self with the past 6 months Suicidal Ideation: No Has patient been a risk to self within the past 6 months prior to admission? : No Suicidal Intent: No Has patient had any suicidal intent within the past 6 months prior to admission? : No Is patient at risk for suicide?: No Suicidal Plan?: No Has patient  had any suicidal plan within the past 6 months prior to admission? : No Access to Means: No What has been your use of drugs/alcohol within the last 12 months?: None reported Previous Attempts/Gestures: No How many times?: 0 Other Self Harm Risks: None Triggers for Past Attempts: None known Intentional Self Injurious Behavior: None Family Suicide History: No Recent stressful life event(s): Loss (Comment) (His dog died a few weeks ago.) Persecutory voices/beliefs?: Yes Depression: Yes Depression Symptoms: Despondent, Feeling worthless/self pity, Loss of interest in usual pleasures Substance abuse history and/or treatment for substance abuse?: No Suicide prevention information given to non-admitted patients: Not applicable  Risk to Others within the past 6 months Homicidal Ideation: No Does patient have any lifetime risk of violence toward others beyond the six months prior to admission? : No Thoughts of Harm to Others: Yes-Currently Present Comment - Thoughts of Harm to Others: Thoughts of harming the hallucinations. Current Homicidal Intent: No Current Homicidal Plan: No Access to Homicidal Means: No Identified Victim: No one History of harm to others?:  No Assessment of Violence: None Noted Violent Behavior Description: No hx of fights. Does patient have access to weapons?: No Criminal Charges Pending?: No Does patient have a court date: No Is patient on probation?: No  Psychosis Hallucinations: Auditory, Visual Delusions: Persecutory  Mental Status Report Appearance/Hygiene: Disheveled, In scrubs Eye Contact: Good Motor Activity: Freedom of movement, Unsteady (Pt uses a cane.) Speech: Logical/coherent Level of Consciousness: Alert Mood: Helpless, Sad, Pleasant Affect: Depressed Anxiety Level: Minimal Thought Processes: Coherent, Relevant Judgement: Unimpaired Orientation: Person, Place, Situation Obsessive Compulsive Thoughts/Behaviors: Moderate  Cognitive Functioning Concentration: Decreased Memory: Recent Impaired, Remote Intact IQ: Average Insight: Fair Impulse Control: Fair Appetite: Good Weight Loss: 0 Weight Gain:  ("I eat all day.") Sleep: No Change Total Hours of Sleep:  (8-9 hours) Vegetative Symptoms: Staying in bed  ADLScreening [facility] Assessment Services) Patient's cognitive ability adequate to safely complete daily activities?: Yes Patient able to express need for assistance with ADLs?: Yes Independently performs ADLs?: No  Prior Inpatient Therapy Prior Inpatient Therapy: Yes Prior Therapy Dates: Two year or more ago Prior Therapy Facilty/Provider(s): [facility] Reason for Treatment: hallucinations  Prior Outpatient Therapy Prior Outpatient Therapy: Yes Prior Therapy Dates: Two years ago [facility] [place] Reason for Treatment: outpatient care Does patient have an ACCT team?: No Does patient have Intensive In-House Services?  : No Does patient have [facility] services? : No Does patient have P4CC services?: No  ADL Screening (condition at time of admission) Patient's cognitive ability adequate to safely complete daily activities?: Yes Is the patient deaf or  have difficulty hearing?: No Does the patient have difficulty seeing, even when wearing glasses/contacts?: Yes (wears reading glasses.) Does the patient have difficulty concentrating, remembering, or making decisions?: Yes Patient able to express need for assistance with ADLs?: Yes Does the patient have difficulty dressing or bathing?: Yes (Putting on socks is difficult.) Independently performs ADLs?: No Communication: Independent Dressing (OT): Needs assistance Is this a change from baseline?: Pre-admission baseline Grooming: Needs assistance Is this a change from baseline?: Pre-admission baseline Feeding: Independent Bathing: Independent Toileting: Independent In/Out Bed: Independent Walks in Home: Independent with device (comment) (Has a w/c and a walker & a cane.) Does the patient have difficulty walking or climbing stairs?: Yes (Uses rails on steps.) Weakness of Legs: Both (Uses a walker, w/c and cane.) Weakness of Arms/Hands: Left (Weakness in left side.)  Home Assistive Devices/Equipment Home Assistive Devices/Equipment: Cane (specify quad or straight),  Grab bars around toilet, Built-in shower seat, Environmental consultant (specify type), Wheelchair    Abuse/Neglect Assessment (Assessment to be complete while patient is alone) Physical Abuse: Denies Verbal Abuse: Denies Sexual Abuse: Denies Exploitation of patient/patient's resources: Denies Self-Neglect: Denies     Regulatory affairs officer (For Healthcare) Does Patient Have a Medical Advance Directive?: Yes Type of Advance Directive: Healthcare Power of Attorney, Living will [place] in Chart?: No - copy requested (Pt thinks that his youngest daughter may be his POA.) Copy of Living Will in Chart?: No - copy requested (Youngest daughter ) Would patient like information on creating a medical advance directive?: No - Patient declined    Additional Information 1:1 In Past 12 Months?: No CIRT Risk: No Elopement  Risk: No Does patient have medical clearance?: Yes     Disposition:  Disposition Initial Assessment Completed for this Encounter: Yes Disposition of Patient: Other dispositions Other disposition(s): Other (Comment) (Pt to be reviewed by FNP)  Curlene Dolphin Ray 03/16/2017 9:51 PM

## 2017-03-17 DIAGNOSIS — F0391 Unspecified dementia with behavioral disturbance: Secondary | ICD-10-CM

## 2017-03-17 DIAGNOSIS — R441 Visual hallucinations: Secondary | ICD-10-CM

## 2017-03-17 DIAGNOSIS — Z87891 Personal history of nicotine dependence: Secondary | ICD-10-CM

## 2017-03-17 MED ORDER — HYDROXYZINE HCL 10 MG PO TABS
10.0000 mg | ORAL_TABLET | Freq: Two times a day (BID) | ORAL | Status: DC
  Administered 2017-03-17: 10 mg via ORAL
  Filled 2017-03-17: qty 1

## 2017-03-17 MED ORDER — RISPERIDONE 0.5 MG PO TABS
0.2500 mg | ORAL_TABLET | Freq: Two times a day (BID) | ORAL | Status: DC
  Administered 2017-03-17: 0.25 mg via ORAL
  Filled 2017-03-17: qty 1

## 2017-03-17 NOTE — Progress Notes (Signed)
CSW contacted and informed patient's nephew Livingston Diones [phone]) that patient was accepted at and being transferred to [facility].  Abundio Miu, [facility]  Clinical Social Worker [phone]

## 2017-03-17 NOTE — ED Notes (Addendum)
Livingston Diones (nephew) [phone]

## 2017-03-17 NOTE — ED Notes (Signed)
EKG given to Dr. Campos 

## 2017-03-17 NOTE — BH Assessment (Signed)
[facility] Assessment Progress Note  Per Shirlee Limerick, pt is accepted at [facility] by Dr. Geanie Kenning. Call report to [phone]. Pt will be going to room 425-A, and will be transported by law enforcement.

## 2017-03-17 NOTE — Consult Note (Signed)
[facility] Consult   Reason for Consult: psychiatric evaluation Referring Physician:  EDP Patient Identification: VARDAAN DEPASCALE MRN:  [id number] Principal Diagnosis: Dementia with psychosis Diagnosis:   Patient Active Problem List   Diagnosis Date Noted  . Dementia with psychosis [F03.91] 09/12/2016    Priority: High  . Impaired mobility and ADLs [Z74.09]   . Hypokalemia [E87.6] 11/05/2016  . Acute delirium [R41.0] 09/08/2016  . Rhabdomyolysis [M62.82] 09/08/2016  . Normocytic anemia [D64.9] 09/08/2016  . Altered mental status [R41.82] 09/08/2016  . Fall at home [W19.XXXA, Y92.009]   . UTI (lower urinary tract infection) [N39.0] 07/25/2016  . Mild cognitive impairment [G31.84] 07/25/2016  . 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 Immobility [Z74.09] 03/20/2016  . Generalized weakness [R53.1] 03/20/2016  . Failure to thrive in adult [R62.7] 03/20/2016  . Neuropathy [G62.9]   . Essential hypertension [I10]   . Physical deconditioning [R53.81]   . 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 GERD without esophagitis [K21.9] 12/19/2015  . Bilateral lower extremity edema [R60.0] 12/19/2015  . Intractable low back pain [M54.5] 02/14/2015  . Difficulty walking [R26.2] 02/14/2015  . Prostate cancer ([place]) [C61] 02/14/2015  . H/O: upper GI bleed [Z87.19]   . Chronic back pain [M54.9, G89.29]   . Visual hallucinations [R44.1]   . 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 male patient admitted with visual hallucinations.  HPI:  Patient with history of encephalopathy, multiple medical issues and Dementia. Patient lives independently and says he feels lonely. Patient reports that he has been seeing little people in his house for the past 4 years and they has refused to leave. Patient says  he feels like the little people  messes with things in his house including  moving his furniture around,  stealing things and adjusting his electronics. Patient reports that seeing the little people has become  more bothersome to him as he spends time alone with them and not talking back.   Past Psychiatric History: as above  Risk to Self: Suicidal Ideation: No Suicidal Intent: No Is patient at risk for suicide?: No Suicidal Plan?: No Access to Means: No What has been your use of drugs/alcohol within the last 12 months?: None reported How many times?: 0 Other Self Harm Risks: None Triggers for Past Attempts: None known Intentional Self Injurious Behavior: None Risk to Others: Homicidal Ideation: No Thoughts of Harm to Others: Yes-Currently Present Comment - Thoughts of Harm to Others: Thoughts of harming the hallucinations. Current Homicidal Intent: No Current Homicidal Plan: No Access to Homicidal Means: No Identified Victim: No one History of harm to others?: No Assessment of Violence: None Noted Violent Behavior Description: No hx of fights. Does patient have access to weapons?: No Criminal Charges Pending?: No Does patient have a court date: No Prior Inpatient Therapy: Prior Inpatient Therapy: Yes Prior Therapy Dates: Two year or more ago Prior Therapy Facilty/Provider(s): [facility] Reason for Treatment: hallucinations Prior Outpatient Therapy: Prior Outpatient Therapy: Yes Prior Therapy Dates: Two years ago [facility] [place] Reason for Treatment: outpatient care Does patient have an ACCT team?: No Does patient have Intensive In-House Services?  : No Does patient have [facility] services? : No Does patient have P4[facility] services?: No  Past Medical History:  Past Medical History:  Diagnosis Date  . Back pain   . Chronic mental illness   .  Colitis   . Dementia   . GERD (gastroesophageal reflux disease)   . Hypertension   . Neuropathy    " MY HANDS "  . Prostate cancer [facility])     Past Surgical History:   Procedure Laterality Date  . KNEE ARTHROSCOPY    . LAMINECTOMY    . PROSTATECTOMY     Family History:  Family History  Problem Relation Age of Onset  . Cancer Brother   . Cancer Maternal Grandmother    Family Psychiatric  History:  Social History:  History  Alcohol Use No     History  Drug Use No    Social History   Social History  . Marital status: Single    Spouse name: N/A  . Number of children: N/A  . Years of education: N/A   Occupational History  . Retired    Social History Main Topics  . Smoking status: Former Smoker    Types: Cigarettes  . Smokeless tobacco: Never Used  . Alcohol use No  . Drug use: No  . Sexual activity: Not Asked   Other Topics Concern  . None   Social History Narrative  . None   Additional Social History:    Allergies:   Allergies  Allergen Reactions  . Tomato Itching and Rash    Labs:  Results for orders placed or performed during the hospital encounter of 03/16/17 (from the past 48 hour(s))  Urinalysis, Routine w reflex microscopic     Status: None   Collection Time: 03/16/17  6:50 PM  Result Value Ref Range   Color, Urine YELLOW YELLOW   APPearance CLEAR CLEAR   Specific Gravity, Urine 1.021 1.005 - 1.030   pH 6.0 5.0 - 8.0   Glucose, UA NEGATIVE NEGATIVE mg/dL   Hgb urine dipstick NEGATIVE NEGATIVE   Bilirubin Urine NEGATIVE NEGATIVE   Ketones, ur NEGATIVE NEGATIVE mg/dL   Protein, ur NEGATIVE NEGATIVE mg/dL   Nitrite NEGATIVE NEGATIVE   Leukocytes, UA NEGATIVE NEGATIVE  Rapid urine drug screen (hospital performed)     Status: None   Collection Time: 03/16/17  6:50 PM  Result Value Ref Range   Opiates NONE DETECTED NONE DETECTED   Cocaine NONE DETECTED NONE DETECTED   Benzodiazepines NONE DETECTED NONE DETECTED   Amphetamines NONE DETECTED NONE DETECTED   Tetrahydrocannabinol NONE DETECTED NONE DETECTED   Barbiturates NONE DETECTED NONE DETECTED    Comment:        DRUG SCREEN FOR MEDICAL PURPOSES ONLY.   IF CONFIRMATION IS NEEDED FOR ANY PURPOSE, NOTIFY LAB WITHIN 5 DAYS.        LOWEST DETECTABLE LIMITS FOR URINE DRUG SCREEN Drug Class       Cutoff (ng/mL) Amphetamine      1000 Barbiturate      200 Benzodiazepine   332 Tricyclics       951 Opiates          300 Cocaine          300 THC              50   CBC with Differential/Platelet     Status: Abnormal   Collection Time: 03/16/17  7:06 PM  Result Value Ref Range   WBC 4.8 4.0 - 10.5 K/uL   RBC 4.30 4.22 - 5.81 MIL/uL   Hemoglobin 11.7 (L) 13.0 - 17.0 g/dL   HCT 36.0 (L) 39.0 - 52.0 %   MCV 83.7 78.0 - 100.0 fL   MCH 27.2 26.0 -  34.0 pg   MCHC 32.5 30.0 - 36.0 g/dL   RDW 15.2 11.5 - 15.5 %   Platelets 267 150 - 400 K/uL   Neutrophils Relative % 56 %   Neutro Abs 2.7 1.7 - 7.7 K/uL   Lymphocytes Relative 34 %   Lymphs Abs 1.6 0.7 - 4.0 K/uL   Monocytes Relative 6 %   Monocytes Absolute 0.3 0.1 - 1.0 K/uL   Eosinophils Relative 4 %   Eosinophils Absolute 0.2 0.0 - 0.7 K/uL   Basophils Relative 0 %   Basophils Absolute 0.0 0.0 - 0.1 K/uL  Comprehensive metabolic panel     Status: Abnormal   Collection Time: 03/16/17  7:06 PM  Result Value Ref Range   Sodium 141 135 - 145 mmol/L   Potassium 3.8 3.5 - 5.1 mmol/L   Chloride 108 101 - 111 mmol/L   CO2 27 22 - 32 mmol/L   Glucose, Bld 105 (H) 65 - 99 mg/dL   BUN 15 6 - 20 mg/dL   Creatinine, Ser 0.92 0.61 - 1.24 mg/dL   Calcium 9.3 8.9 - 10.3 mg/dL   Total Protein 6.8 6.5 - 8.1 g/dL   Albumin 4.0 3.5 - 5.0 g/dL   AST 18 15 - 41 U/L   ALT 11 (L) 17 - 63 U/L   Alkaline Phosphatase 55 38 - 126 U/L   Total Bilirubin 0.4 0.3 - 1.2 mg/dL   GFR calc non Af Amer >60 >60 mL/min   GFR calc Af Amer >60 >60 mL/min    Comment: (NOTE) The eGFR has been calculated using the CKD EPI equation. This calculation has not been validated in all clinical situations. eGFR's persistently <60 mL/min signify possible Chronic Kidney Disease.    Anion gap 6 5 - 15  Ammonia     Status:  Abnormal   Collection Time: 03/16/17  7:06 PM  Result Value Ref Range   Ammonia 37 (H) 9 - 35 umol/L  Acetaminophen level     Status: Abnormal   Collection Time: 03/16/17  7:06 PM  Result Value Ref Range   Acetaminophen (Tylenol), Serum <10 (L) 10 - 30 ug/mL    Comment:        THERAPEUTIC CONCENTRATIONS VARY SIGNIFICANTLY. A RANGE OF 10-30 ug/mL MAY BE AN EFFECTIVE CONCENTRATION FOR MANY PATIENTS. HOWEVER, SOME ARE BEST TREATED AT CONCENTRATIONS OUTSIDE THIS RANGE. ACETAMINOPHEN CONCENTRATIONS >150 ug/mL AT 4 HOURS AFTER INGESTION AND >50 ug/mL AT 12 HOURS AFTER INGESTION ARE OFTEN ASSOCIATED WITH TOXIC REACTIONS.   Salicylate level     Status: None   Collection Time: 03/16/17  7:06 PM  Result Value Ref Range   Salicylate Lvl <4.4 2.8 - 30.0 mg/dL  Ethanol     Status: None   Collection Time: 03/16/17  7:06 PM  Result Value Ref Range   Alcohol, Ethyl (B) <5 <5 mg/dL    Comment:        LOWEST DETECTABLE LIMIT FOR SERUM ALCOHOL IS 5 mg/dL FOR MEDICAL PURPOSES ONLY     Current Facility-Administered Medications  Medication Dose Route Frequency Provider Last Rate Last Dose  . docusate sodium (COLACE) capsule 100 mg  100 mg Oral BID Tanna Furry, MD   100 mg at 03/17/17 1007  . hydrochlorothiazide (MICROZIDE) capsule 25 mg  25 mg Oral Daily Tanna Furry, MD   25 mg at 03/17/17 0347  . hydrOXYzine (ATARAX/VISTARIL) tablet 10 mg  10 mg Oral BID Corena Pilgrim, MD      . pantoprazole (  PROTONIX) EC tablet 40 mg  40 mg Oral BID Tanna Furry, MD   40 mg at 03/17/17 0955  . risperiDONE (RISPERDAL) tablet 0.25 mg  0.25 mg Oral BID Corena Pilgrim, MD       Current Outpatient Prescriptions  Medication Sig Dispense Refill  . amLODipine (NORVASC) 5 MG tablet Take 1 tablet (5 mg total) by mouth daily.    Marland Kitchen docusate sodium (COLACE) 100 MG capsule Take 1 capsule (100 mg total) by mouth 2 (two) times daily.    . hydrochlorothiazide (MICROZIDE) 12.5 MG capsule Take 12.5 mg by mouth daily.     . pantoprazole (PROTONIX) 40 MG tablet Take 1 tablet (40 mg total) by mouth 2 (two) times daily. 60 tablet 0  . QUEtiapine (SEROQUEL) 25 MG tablet Take 1 tablet (25 mg total) by mouth at bedtime.    . traMADol (ULTRAM) 50 MG tablet Take one tablet by mouth every 6 hours as needed for pain 120 tablet 0    Musculoskeletal: Strength & Muscle Tone: within normal limits Gait & Station: normal Patient leans: N/A  Psychiatric Specialty Exam: Physical Exam  Psychiatric: His speech is normal. Judgment and thought content normal. His mood appears anxious. He is slowed and actively hallucinating. Cognition and memory are impaired.    Review of Systems  Constitutional: Negative.   Eyes: Negative.   Respiratory: Negative.   Gastrointestinal: Negative.   Genitourinary: Negative.   Musculoskeletal: Positive for myalgias.  Skin: Negative.   Neurological: Negative.   Endo/Heme/Allergies: Negative.   Psychiatric/Behavioral: Positive for hallucinations. The patient is nervous/anxious.     Blood pressure (!) 143/87, pulse 69, temperature 97.9 F (36.6 C), temperature source Oral, resp. rate 16, height 5' 9"  (1.753 m), weight 83.9 kg (185 lb), SpO2 98 %.Body mass index is 27.32 kg/m.  General Appearance: Casual  Eye Contact:  Good  Speech:  Clear and Coherent  Volume:  Normal  Mood:  Anxious and Depressed  Affect:  Constricted  Thought Process:  Coherent  Orientation:  Other:  only to person and place  Thought Content:  Delusions and Hallucinations: Visual  Suicidal Thoughts:  No  Homicidal Thoughts:  No  Memory:  Immediate;   Fair Recent;   Poor Remote;   Poor  Judgement:  Poor  Insight:  Lacking  Psychomotor Activity:  Psychomotor Retardation  Concentration:  Concentration: Fair and Attention Span: Fair  Recall:  [employer] of Knowledge:  Fair  Language:  Good  Akathisia:  No  Handed:  Right  AIMS (if indicated):     Assets:  Communication Skills Desire for Improvement  ADL's:   Intact  Cognition:  Impaired,  Moderate  Sleep:   poor     Treatment Plan Summary: Daily contact with patient to assess and evaluate symptoms and progress in treatment and Medication management  Start Risperdal 0.23m bid for psychosis, Trazodone 519mqhs for sleep and Hydroxyzine 10 mg bid for anxiety  Disposition: Recommend psychiatric Inpatient admission when medically cleared.  AkCorena PilgrimMD 03/17/2017 11:12 AM

## 2017-03-17 NOTE — ED Provider Notes (Signed)
Accepted to [facility] by Dr Debbora Dus, MD 03/17/17 [phone]

## 2017-03-18 DIAGNOSIS — E0781 Sick-euthyroid syndrome: Secondary | ICD-10-CM | POA: Diagnosis not present

## 2017-03-18 DIAGNOSIS — R9431 Abnormal electrocardiogram [ECG] [EKG]: Secondary | ICD-10-CM | POA: Diagnosis not present

## 2017-03-18 DIAGNOSIS — R600 Localized edema: Secondary | ICD-10-CM | POA: Diagnosis not present

## 2017-03-18 DIAGNOSIS — I10 Essential (primary) hypertension: Secondary | ICD-10-CM | POA: Diagnosis not present

## 2017-03-18 DIAGNOSIS — M545 Low back pain: Secondary | ICD-10-CM | POA: Diagnosis not present

## 2017-03-20 DIAGNOSIS — E559 Vitamin D deficiency, unspecified: Secondary | ICD-10-CM | POA: Diagnosis not present

## 2017-03-20 DIAGNOSIS — I10 Essential (primary) hypertension: Secondary | ICD-10-CM | POA: Diagnosis not present

## 2017-03-20 DIAGNOSIS — R600 Localized edema: Secondary | ICD-10-CM | POA: Diagnosis not present

## 2017-03-20 DIAGNOSIS — L84 Corns and callosities: Secondary | ICD-10-CM | POA: Diagnosis not present

## 2017-03-20 DIAGNOSIS — E0781 Sick-euthyroid syndrome: Secondary | ICD-10-CM | POA: Diagnosis not present

## 2017-03-22 DIAGNOSIS — I10 Essential (primary) hypertension: Secondary | ICD-10-CM | POA: Diagnosis not present

## 2017-03-22 DIAGNOSIS — E559 Vitamin D deficiency, unspecified: Secondary | ICD-10-CM | POA: Diagnosis not present

## 2017-03-22 DIAGNOSIS — L84 Corns and callosities: Secondary | ICD-10-CM | POA: Diagnosis not present

## 2017-03-22 DIAGNOSIS — E0781 Sick-euthyroid syndrome: Secondary | ICD-10-CM | POA: Diagnosis not present

## 2017-03-22 DIAGNOSIS — R600 Localized edema: Secondary | ICD-10-CM | POA: Diagnosis not present

## 2017-04-12 DIAGNOSIS — N390 Urinary tract infection, site not specified: Secondary | ICD-10-CM

## 2017-04-12 DIAGNOSIS — R600 Localized edema: Secondary | ICD-10-CM

## 2017-04-12 HISTORY — DX: Urinary tract infection, site not specified: N39.0

## 2017-04-12 HISTORY — DX: Localized edema: R60.0

## 2017-04-28 ENCOUNTER — Encounter (HOSPITAL_COMMUNITY): Payer: Self-pay | Admitting: Emergency Medicine

## 2017-04-28 ENCOUNTER — Emergency Department (HOSPITAL_COMMUNITY): Payer: Medicare Other

## 2017-04-28 ENCOUNTER — Observation Stay (HOSPITAL_COMMUNITY)
Admission: EM | Admit: 2017-04-28 | Discharge: 2017-05-01 | Disposition: A | Discharge status: Home / Self Care | Payer: Medicare Other | Attending: Oncology | Admitting: Oncology

## 2017-04-28 DIAGNOSIS — Z79899 Other long term (current) drug therapy: Secondary | ICD-10-CM | POA: Insufficient documentation

## 2017-04-28 DIAGNOSIS — I6789 Other cerebrovascular disease: Secondary | ICD-10-CM | POA: Diagnosis not present

## 2017-04-28 DIAGNOSIS — M48061 Spinal stenosis, lumbar region without neurogenic claudication: Secondary | ICD-10-CM

## 2017-04-28 DIAGNOSIS — M479 Spondylosis, unspecified: Secondary | ICD-10-CM | POA: Diagnosis not present

## 2017-04-28 DIAGNOSIS — G629 Polyneuropathy, unspecified: Secondary | ICD-10-CM | POA: Insufficient documentation

## 2017-04-28 DIAGNOSIS — F0391 Unspecified dementia with behavioral disturbance: Secondary | ICD-10-CM | POA: Insufficient documentation

## 2017-04-28 DIAGNOSIS — K219 Gastro-esophageal reflux disease without esophagitis: Secondary | ICD-10-CM | POA: Insufficient documentation

## 2017-04-28 DIAGNOSIS — N390 Urinary tract infection, site not specified: Secondary | ICD-10-CM

## 2017-04-28 DIAGNOSIS — M549 Dorsalgia, unspecified: Secondary | ICD-10-CM | POA: Diagnosis not present

## 2017-04-28 DIAGNOSIS — R442 Other hallucinations: Secondary | ICD-10-CM | POA: Insufficient documentation

## 2017-04-28 DIAGNOSIS — R531 Weakness: Secondary | ICD-10-CM | POA: Diagnosis not present

## 2017-04-28 DIAGNOSIS — R609 Edema, unspecified: Secondary | ICD-10-CM | POA: Diagnosis not present

## 2017-04-28 DIAGNOSIS — Z8546 Personal history of malignant neoplasm of prostate: Secondary | ICD-10-CM | POA: Insufficient documentation

## 2017-04-28 DIAGNOSIS — M6282 Rhabdomyolysis: Secondary | ICD-10-CM

## 2017-04-28 DIAGNOSIS — R262 Difficulty in walking, not elsewhere classified: Secondary | ICD-10-CM | POA: Diagnosis not present

## 2017-04-28 DIAGNOSIS — R627 Adult failure to thrive: Secondary | ICD-10-CM | POA: Insufficient documentation

## 2017-04-28 DIAGNOSIS — I69992 Facial weakness following unspecified cerebrovascular disease: Secondary | ICD-10-CM | POA: Diagnosis not present

## 2017-04-28 DIAGNOSIS — M5410 Radiculopathy, site unspecified: Secondary | ICD-10-CM

## 2017-04-28 DIAGNOSIS — M5416 Radiculopathy, lumbar region: Secondary | ICD-10-CM | POA: Diagnosis not present

## 2017-04-28 DIAGNOSIS — R29898 Other symptoms and signs involving the musculoskeletal system: Secondary | ICD-10-CM | POA: Diagnosis present

## 2017-04-28 DIAGNOSIS — M545 Low back pain: Secondary | ICD-10-CM | POA: Diagnosis not present

## 2017-04-28 DIAGNOSIS — I10 Essential (primary) hypertension: Secondary | ICD-10-CM | POA: Insufficient documentation

## 2017-04-28 DIAGNOSIS — R600 Localized edema: Secondary | ICD-10-CM | POA: Diagnosis not present

## 2017-04-28 LAB — CBC WITH DIFFERENTIAL/PLATELET
BASOS ABS: 0 10*3/uL (ref 0.0–0.1)
Basophils Relative: 0 %
EOS PCT: 3 %
Eosinophils Absolute: 0.2 10*3/uL (ref 0.0–0.7)
HCT: 37.7 % — ABNORMAL LOW (ref 39.0–52.0)
Hemoglobin: 11.9 g/dL — ABNORMAL LOW (ref 13.0–17.0)
LYMPHS PCT: 20 %
Lymphs Abs: 1.2 10*3/uL (ref 0.7–4.0)
MCH: 27.3 pg (ref 26.0–34.0)
MCHC: 31.6 g/dL (ref 30.0–36.0)
MCV: 86.5 fL (ref 78.0–100.0)
MONO ABS: 0.4 10*3/uL (ref 0.1–1.0)
Monocytes Relative: 7 %
Neutro Abs: 4 10*3/uL (ref 1.7–7.7)
Neutrophils Relative %: 70 %
PLATELETS: 244 10*3/uL (ref 150–400)
RBC: 4.36 MIL/uL (ref 4.22–5.81)
RDW: 14.9 % (ref 11.5–15.5)
WBC: 5.8 10*3/uL (ref 4.0–10.5)

## 2017-04-28 LAB — I-STAT TROPONIN, ED: TROPONIN I, POC: 0 ng/mL (ref 0.00–0.08)

## 2017-04-28 LAB — COMPREHENSIVE METABOLIC PANEL
ALBUMIN: 3.5 g/dL (ref 3.5–5.0)
ALK PHOS: 47 U/L (ref 38–126)
ALT: 11 U/L — AB (ref 17–63)
AST: 40 U/L (ref 15–41)
Anion gap: 9 (ref 5–15)
BILIRUBIN TOTAL: 1.3 mg/dL — AB (ref 0.3–1.2)
BUN: 9 mg/dL (ref 6–20)
CALCIUM: 8.9 mg/dL (ref 8.9–10.3)
CO2: 23 mmol/L (ref 22–32)
Chloride: 111 mmol/L (ref 101–111)
Creatinine, Ser: 1.11 mg/dL (ref 0.61–1.24)
GFR calc Af Amer: >60 mL/min (ref >60)
GFR calc non Af Amer: >60 mL/min (ref >60)
GLUCOSE: 104 mg/dL — AB (ref 65–99)
Potassium: 4.4 mmol/L (ref 3.5–5.1)
Sodium: 143 mmol/L (ref 135–145)
TOTAL PROTEIN: 6.8 g/dL (ref 6.5–8.1)

## 2017-04-28 LAB — URINALYSIS, ROUTINE W REFLEX MICROSCOPIC
Bacteria, UA: NONE SEEN
Bilirubin Urine: NEGATIVE
Glucose, UA: NEGATIVE mg/dL
Ketones, ur: 5 mg/dL — AB
Leukocytes, UA: NEGATIVE
Nitrite: NEGATIVE
PH: 5 (ref 5.0–8.0)
Protein, ur: NEGATIVE mg/dL
Specific Gravity, Urine: 1.025 (ref 1.005–1.030)

## 2017-04-28 LAB — I-STAT CG4 LACTIC ACID, ED: Lactic Acid, Venous: 1.21 mmol/L (ref 0.5–1.9)

## 2017-04-28 LAB — CK: Total CK: 1256 U/L — ABNORMAL HIGH (ref 49–397)

## 2017-04-28 LAB — BRAIN NATRIURETIC PEPTIDE: B Natriuretic Peptide: 7.8 pg/mL (ref 0.0–100.0)

## 2017-04-28 MED ORDER — RIVASTIGMINE TARTRATE 1.5 MG PO CAPS
1.5000 mg | ORAL_CAPSULE | Freq: Two times a day (BID) | ORAL | Status: DC
  Administered 2017-04-28 – 2017-05-01 (×6): 1.5 mg via ORAL
  Filled 2017-04-28 (×7): qty 1

## 2017-04-28 MED ORDER — PANTOPRAZOLE SODIUM 40 MG PO TBEC
40.0000 mg | DELAYED_RELEASE_TABLET | Freq: Two times a day (BID) | ORAL | Status: DC
  Administered 2017-04-28 – 2017-05-01 (×6): 40 mg via ORAL
  Filled 2017-04-28 (×6): qty 1

## 2017-04-28 MED ORDER — FUROSEMIDE 10 MG/ML IJ SOLN
40.0000 mg | Freq: Once | INTRAMUSCULAR | Status: AC
Start: 2017-04-28 — End: 2017-04-28
  Administered 2017-04-28: 40 mg via INTRAVENOUS
  Filled 2017-04-28: qty 4

## 2017-04-28 MED ORDER — AMLODIPINE BESYLATE 5 MG PO TABS
5.0000 mg | ORAL_TABLET | Freq: Every day | ORAL | Status: DC
Start: 2017-04-29 — End: 2017-05-01
  Administered 2017-04-29 – 2017-05-01 (×3): 5 mg via ORAL
  Filled 2017-04-28 (×3): qty 1

## 2017-04-28 MED ORDER — SODIUM CHLORIDE 0.9 % IV BOLUS (SEPSIS)
500.0000 mL | Freq: Once | INTRAVENOUS | Status: AC
  Administered 2017-04-28: 500 mL via INTRAVENOUS

## 2017-04-28 MED ORDER — ACETAMINOPHEN 650 MG RE SUPP
650.0000 mg | Freq: Four times a day (QID) | RECTAL | Status: DC | PRN
Start: 2017-04-28 — End: 2017-05-01

## 2017-04-28 MED ORDER — ACETAMINOPHEN 325 MG PO TABS
650.0000 mg | ORAL_TABLET | Freq: Four times a day (QID) | ORAL | Status: DC | PRN
  Administered 2017-04-28 – 2017-04-30 (×4): 650 mg via ORAL
  Filled 2017-04-28 (×4): qty 2

## 2017-04-28 MED ORDER — DOCUSATE SODIUM 100 MG PO CAPS
100.0000 mg | ORAL_CAPSULE | Freq: Two times a day (BID) | ORAL | Status: DC
  Administered 2017-04-28 – 2017-05-01 (×6): 100 mg via ORAL
  Filled 2017-04-28 (×7): qty 1

## 2017-04-28 MED ORDER — HYDROCHLOROTHIAZIDE 12.5 MG PO CAPS
12.5000 mg | ORAL_CAPSULE | Freq: Every day | ORAL | Status: DC
  Administered 2017-04-29 – 2017-05-01 (×3): 12.5 mg via ORAL
  Filled 2017-04-28 (×3): qty 1

## 2017-04-28 MED ORDER — ENOXAPARIN SODIUM 40 MG/0.4ML ~~LOC~~ SOLN
40.0000 mg | SUBCUTANEOUS | Status: DC
  Administered 2017-04-28 – 2017-04-30 (×3): 40 mg via SUBCUTANEOUS
  Filled 2017-04-28 (×3): qty 0.4

## 2017-04-28 MED ORDER — VITAMIN D 1000 UNITS PO TABS
1000.0000 [IU] | ORAL_TABLET | Freq: Every day | ORAL | Status: DC
  Administered 2017-04-29 – 2017-05-01 (×3): 1000 [IU] via ORAL
  Filled 2017-04-28 (×3): qty 1

## 2017-04-28 MED ORDER — IBUPROFEN 200 MG PO TABS: 600.0000 mg | ORAL_TABLET | Freq: Four times a day (QID) | ORAL | Status: DC | PRN

## 2017-04-28 MED ORDER — QUETIAPINE FUMARATE 50 MG PO TABS
50.0000 mg | ORAL_TABLET | Freq: Every day | ORAL | Status: DC
  Administered 2017-04-28 – 2017-04-30 (×3): 50 mg via ORAL
  Filled 2017-04-28: qty 1
  Filled 2017-04-28: qty 2
  Filled 2017-04-28: qty 1
  Filled 2017-04-28: qty 2
  Filled 2017-04-28: qty 1
  Filled 2017-04-28: qty 2
  Filled 2017-04-28: qty 1

## 2017-04-28 MED ORDER — ACETAMINOPHEN 325 MG PO TABS
650.0000 mg | ORAL_TABLET | Freq: Once | ORAL | Status: AC
  Administered 2017-04-28: 650 mg via ORAL
  Filled 2017-04-28: qty 2

## 2017-04-28 NOTE — ED Notes (Addendum)
Patient was given a 2nd [place] Sandwich bag, and Coke.

## 2017-04-28 NOTE — ED Triage Notes (Signed)
Patient presents today from home with complaint of weakness and lower leg swelling. Patient alert and oriented to self, place. Patient able to tell month not year. Patient has facial droop. Patient presented incontinent of urine and stool. Patient answers question and follows commands.

## 2017-04-28 NOTE — Progress Notes (Signed)
Patient trasfered from ED to [phone] via stretcher; alert and oriented x 4; complaints of generalized chronic pain; IV saline locked in LFA and RH. Orient patient to room and unit; gave patient care guide; instructed how to use the call bell and  fall risk precautions. Will continue to monitor the patient.

## 2017-04-28 NOTE — ED Provider Notes (Signed)
[place] DEPT Provider Note   CSN: [id number] Arrival date & time: 04/28/17  1211     History   Chief Complaint Chief Complaint  Patient presents with  . Weakness    HPI BASEM YANNUZZI is a 71 y.o. male.  HPI OZIEL BEITLER is a 71 y.o. male presents to emergency department with complaint of bilateral lower leg pain and swelling. Patient called out to EMS is because he is unable to get up and walk. Patient with history of bilateral lower leg swelling but states this is much worse than before. He is also complaining of chronic back and bilateral shoulder pain. According to EMS, patient's house in terrible condition, with feces and urine covering him and all over the house. Broken furniture and some broken glass in the living room, according to EMS possibly from patient falling in the house. Patient unable to recall any falls. He states that he could not walk well  yesterday and today he cannot walk at all. Denies any injuries. Denies any new numbness or weakness in extremities. Denies any hallucinations, but history of the same. Patient lives alone and states his nephew comes and helps him on a daily basis.  Past Medical History:  Diagnosis Date  . Back pain   . Chronic mental illness   . Colitis   . Dementia   . GERD (gastroesophageal reflux disease)   . Hypertension   . Neuropathy    " MY HANDS "  . Prostate cancer [facility])     Patient Active Problem List   Diagnosis Date Noted  . Impaired mobility and ADLs   . Hypokalemia 11/05/2016  . Dementia with psychosis 09/12/2016  . Acute delirium 09/08/2016  . Rhabdomyolysis 09/08/2016  . Normocytic anemia 09/08/2016  . Altered mental status 09/08/2016  . Fall at home   . UTI (lower urinary tract infection) 07/25/2016  . Mild cognitive impairment 07/25/2016  . 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 Immobility 03/20/2016  . Generalized weakness 03/20/2016  . Failure to thrive in adult 03/20/2016  . Neuropathy     . Essential hypertension   . Physical deconditioning   . 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 GERD without esophagitis 12/19/2015  . Bilateral lower extremity edema 12/19/2015  . Intractable low back pain 02/14/2015  . Difficulty walking 02/14/2015  . Prostate cancer ([place]) 02/14/2015  . H/O: upper GI bleed   . Chronic back pain   . Visual hallucinations   . Psychoses     Past Surgical History:  Procedure Laterality Date  . KNEE ARTHROSCOPY    . LAMINECTOMY    . PROSTATECTOMY         Home Medications    Prior to Admission medications   Medication Sig Start Date End Date Taking? Authorizing Provider  amLODipine (NORVASC) 5 MG tablet Take 1 tablet (5 mg total) by mouth daily. 09/14/16   Barton Dubois, MD  docusate sodium (COLACE) 100 MG capsule Take 1 capsule (100 mg total) by mouth 2 (two) times daily. 03/22/16   Rai, Vernelle Emerald, MD  hydrochlorothiazide (MICROZIDE) 12.5 MG capsule Take 12.5 mg by mouth daily. 08/21/16   [provider]  pantoprazole (PROTONIX) 40 MG tablet Take 1 tablet (40 mg total) by mouth 2 (two) times daily. 12/16/15   Leo Grosser, MD  QUEtiapine (SEROQUEL) 25 MG tablet Take 1 tablet (25 mg total) by mouth at bedtime. 09/13/16   Barton Dubois, MD  traMADol (ULTRAM) 50 MG tablet Take  one tablet by mouth every 6 hours as needed for pain 11/19/16   Lauree Chandler, NP    Family History Family History  Problem Relation Age of Onset  . Cancer Brother   . Cancer Maternal Grandmother     Social History Social History  Substance Use Topics  . Smoking status: Former Smoker    Types: Cigarettes  . Smokeless tobacco: Never Used  . Alcohol use No     Allergies   Tomato   Review of Systems Review of Systems  Constitutional: Negative for chills and fever.  Respiratory: Negative for cough, chest tightness and shortness of breath.   Cardiovascular: Positive for leg swelling. Negative for chest pain and palpitations.   Gastrointestinal: Negative for abdominal distention, abdominal pain, diarrhea, nausea and vomiting.  Genitourinary: Negative for dysuria, frequency, hematuria and urgency.  Musculoskeletal: Positive for arthralgias, back pain and myalgias. Negative for neck pain and neck stiffness.  Skin: Negative for rash.  Allergic/Immunologic: Negative for immunocompromised state.  Neurological: Negative for dizziness, weakness, light-headedness, numbness and headaches.  All other systems reviewed and are negative.    Physical Exam Updated Vital Signs BP (!) 140/107 (BP Location: Right Arm)   Pulse 93   Temp 98.2 F (36.8 C) (Oral)   Resp (!) 24   SpO2 99%   Physical Exam  Constitutional: He is oriented to person, place, and time. He appears well-developed and well-nourished. No distress.  Disheveled. Face is covered in dried food, dried up feces on the chest, abdomen, perineum, pants are wet with urine  HENT:  Head: Normocephalic and atraumatic.  Eyes: Conjunctivae and EOM are normal. Pupils are equal, round, and reactive to light.  Neck: Neck supple.  Cardiovascular: Normal rate, regular rhythm and normal heart sounds.   Pulmonary/Chest: Effort normal. No respiratory distress. He has no wheezes. He has no rales.  Abdominal: Soft. Bowel sounds are normal. He exhibits no distension. There is no tenderness. There is no rebound.  Musculoskeletal: He exhibits edema.  3+ peripheral edema bilaterally up to the knees. No skin wounds. No skin discoloration.  Neurological: He is alert and oriented to person, place, and time. He displays normal reflexes. No cranial nerve deficit. Coordination normal.  Moving all extremities bilaterally.5/5 and equal strength to upper and lower extremities. Grip strength is 5/5 and equal. No pronator drift.  Skin: Skin is warm and dry. Capillary refill takes less than 2 seconds.  Nursing note and vitals reviewed.    ED Treatments / Results  Labs (all labs ordered  are listed, but only abnormal results are displayed) Labs Reviewed  CBC WITH DIFFERENTIAL/PLATELET  COMPREHENSIVE METABOLIC PANEL  BRAIN NATRIURETIC PEPTIDE  I-STAT CG4 LACTIC ACID, ED  I-STAT TROPOININ, ED    EKG  EKG Interpretation None       Radiology No results found.  Procedures Procedures (including critical care time)  Medications Ordered in ED Medications - No data to display   Initial Impression / Assessment and Plan / ED Course  I have reviewed the triage vital signs and the nursing notes.  Pertinent labs & imaging results that were available during my care of the patient were reviewed by me and considered in my medical decision making (see chart for details).     Patient emergency department with bilateral leg swelling, apparently unable to get up or walk for several days. The patient does have peripheral edema on exam. He states it is much worse than normal. Patient is having pain to his lower  back. I reviewed patient's prior visits, he has been admitted for similar in the past and has stated several skilled nursing facilities which time signing himself out. We will check some labs, urinalysis. Will get x-rays of the lumbar spine and CT given possible fall. Patient seems to be alerted and oriented 3 at the time.  4:00 PM Labs unremarkable other than CK 1256 consistent with some dehydration. I would interiorly assess patient, and patient to urinated all over himself. I reviewed his prior MRI of his back, which showed pretty significant degenerative changes. Question if his pain could be related to his back arthritis. I try to get patient to sit up in bed and get up, he is unable to do so. He is also continuously incontinent of urine. I spoke with teaching service, they will come by and see patient for possible admission.  Vitals:   04/28/17 1300 04/28/17 1407 04/28/17 1430 04/28/17 1500  BP: (!) 148/91 (!) 157/98 (!) 156/95 (!) 163/89  Pulse: 85 74    Resp: (!)  26 16 18 20   Temp:  98.7 F (37.1 C)    TempSrc:  Oral    SpO2: 100% 91%       Final Clinical Impressions(s) / ED Diagnoses   Final diagnoses:  Generalized weakness  Peripheral edema  Bilateral back pain, unspecified back location, unspecified chronicity    New Prescriptions New Prescriptions   No medications on file     Jeannett Senior, Hershal Coria 04/30/17 0002    Charlesetta Shanks, MD 05/03/17 [phone]

## 2017-04-28 NOTE — ED Notes (Signed)
Report called to 5w 

## 2017-04-28 NOTE — ED Notes (Addendum)
The pt has used the urinal and he has voided around the urinal his bed is very wet  Cleaned him up changed his bed sheets and he has on a condom cath per his request

## 2017-04-28 NOTE — H&P (Signed)
Date: 04/28/2017               Patient Name:  Evan Charles MRN: [id number]  DOB: 1946-10-24 Age / Sex: 71 y.o., male   PCP: System, Provider Not In         Medical Service: Internal Medicine Teaching Service         Attending Physician: Dr. Annia Belt, MD    First Contact: Dr. Einar Gip, DO Pager: [phone]  Second Contact: Dr. Zada Finders, MD Pager: [phone]       After Hours (After 5p/  First Contact Pager: [phone]  weekends / holidays): Second Contact Pager: [phone]   Chief Complaint: leg spasms, back pain  History of Present Illness:  71 y/o M with MHx of dementia with intermittent psychosis, intractable low back pain secondary to DDD of lumbar spine, impaired mobility and ADLs who presents from home for evaluation of BL thigh spasms, chronic back pain and difficulty ambulating secondary to the above. Reports new-onset BL thigh spasms for the past 2 days rendering him unable to ambulate. He also complains of worsening chronic BL LE swelling during this time as well. Denies any fevers, chills, chest pain, shortness of breath, falls, worsening back pain, nausea, vomiting, abdominal pain or dysuria. Notes compliance with his medicines at home however unable to name them.   EMS report the patients house was in terrible condition with feces and urine throughout along with broken furniture and glass. Patient himself was also covered in feces, urine and food. He lives alone but feels he gets enough help at home and is able to care for himself. He has a friend who visits daily and gets food from [employer]." Notes he has been in ~6 skilled facilities however signs himself out for various reasons.   VSS in ED. Face covered in dried food with dried feces to the chest, abdomen and perineum and clothes were saturated with urine. Labs significant for CK 1256. CMET with T-bilrubin of 1.3 but otherwise wnl. CBC without leukocytosis. UA not infected and BNP 7.8. CXR without  infiltrates, edema or effusion. Plain films of lumbar spine with unchanged multilevel degenerative changes. CT head with atrophy but without acute intracranial abnormality. He was subsequently admitted to IMTS for further evaluation.  Meds: Current Meds  Medication Sig  . traMADol (ULTRAM) 50 MG tablet Take one tablet by mouth every 6 hours as needed for pain (Patient taking differently: Take 50 mg by mouth every 6 (six) hours as needed (pain). )   Allergies: Allergies as of 04/28/2017 - Review Complete 04/28/2017  Allergen Reaction Noted  . Tomato Itching and Rash 07/25/2016   Past Medical History:  Diagnosis Date  . Back pain   . Chronic mental illness   . Colitis   . Dementia   . GERD (gastroesophageal reflux disease)   . Hypertension   . Neuropathy    " MY HANDS "  . Prostate cancer [facility])    Family History: Notes non-specific family history of cancer. Unable to clarify.  Social History: Former smoker, quit many years ago. Denies any drug or alcohol use. Lives alone and states he is able to care for himself. [place] veteran and has Data processing manager who comes by daily to check on him and deliver groceries. Meals from "[employer]."   Review of Systems: A complete ROS was negative except as per HPI.   Physical Exam: Blood pressure (!) 163/89, pulse 74, temperature 98.7 F (  37.1 C), temperature source Oral, resp. rate 20, SpO2 91 %. General: Well-nourished but chronically-ill appearing AA male resting in bed. Malodorous. HENT: Face crusted with food. PERRL. No conjunctival injection, icterus or ptosis. MM dry.  Cardiovascular: Regular rate and rhythm. No MR. Pulmonary: CTA BL on anterior chest. Breathing comfortably on RA. Abdomen: Soft, non-tender and non-distended. No guarding or rigidity. +bowel sounds.  Extremities: 2+ edema of BL LE to knee. Intact distal pulses. No gross deformities. Skin: Warm, dry. No cyanosis. Sheets with urine and traces of feces.  Neuro: Alert.  Oriented to month, location and birthday. Not oriented to year. Strength and sensation grossly intact but exam limited by pain. Psych: Mood pleasant with congruent affect. Confabulates and answers questions tangentially.   EKG: NSR, rate 85. RBBB.   CXR: No acute abnormalities. Chronic elevation of left hemidiaphragm   Lumbar Spine plain films: Chronic degenerative changes, unchanged from prior  CT Head: No acute intracranial abnormality. Atrophy  Assessment & Plan by Problem: Principal Problem:   Spinal stenosis of lumbar region with radiculopathy Active Problems:   GERD without esophagitis   Bilateral lower extremity edema   Failure to thrive in adult   Essential hypertension   Leg weakness  # Muscle Weakness and Spasms ## Advanced Multi-level Lumbar Spinal Stenosis with Chronic Pain and Radiculopathy 71 y/o M here with muscle spasms, weakness and chronic back pain with radiculopathy secondary to advanced spinal stenosis. "Acute" worsening rendered him unable to walk for several days. Chart review shows multiple presentations for the same. Takes tramadol at home but patient has worsening AMS witht his medication. Patient clearly unable to care for himself at home as outlined below and would benefit from facility placement.   -Tylenol prn -K pad -Toradol if ineffective -Please limit use of narcotics in this patient with dementia -PT/OT -CSW consult  # Failure to Thrive ## Dementia with Hallucinations This is patients 4th admission with this presentation in past year. Has been placed in several SNFs however signs himself out. Each time, his competence has been questioned however ultimately was felt OK to make his own medical decisions. I question his judgement and his ability to make his own decisions as well as his ability to care for himself. Apparently, a complaint was placed with APS. This patient requires significantly more care than he is currently reciving and would benefit  from long-term placement.  -Consider competency evaluation -Consider recontacting APS -Recently admitted/IVC in early May for visual hallucinations and was d/c to inpatient BH -Continue Seroquel 50 mg QHS and Rivastigmine 1.5 mg BID  # Rhabdomyolysis: CK ~1200, renal function stable. No evidence for compartment syndrome on examination. Likely from patients long down-time secondary to immobility. Give 500 cc in ED. -Follow renal fxn -Follow CK in AM  # BL LE Edema No ECHO in chart but believe edema secondary to patients immobility. BNP 7.8. Weight actually dry for patient, usually 190's. Will apply ted hose and elevate LE's. Given IV Lasix 40 mg x1 by ED, will monitor response. -Daily weights  # HTN Continue Amlodipine 5mg  and HCTZ 12.5 mg daily  Diet: HH/carb mod IVF: None. S/p 500 cc bolus Code status: DNI - does want chest compressions, shocks DVT ppx: lovenox  Dispo: Admit patient to Observation with expected length of stay less than 2 midnights.  SignedEinar Gip, DO 04/28/2017, 3:54 PM  Pager: [phone]

## 2017-04-29 DIAGNOSIS — N390 Urinary tract infection, site not specified: Secondary | ICD-10-CM | POA: Diagnosis not present

## 2017-04-29 DIAGNOSIS — R29898 Other symptoms and signs involving the musculoskeletal system: Secondary | ICD-10-CM | POA: Diagnosis not present

## 2017-04-29 DIAGNOSIS — Y92009 Unspecified place in unspecified non-institutional (private) residence as the place of occurrence of the external cause: Secondary | ICD-10-CM

## 2017-04-29 DIAGNOSIS — B9689 Other specified bacterial agents as the cause of diseases classified elsewhere: Secondary | ICD-10-CM | POA: Diagnosis not present

## 2017-04-29 DIAGNOSIS — G8929 Other chronic pain: Secondary | ICD-10-CM | POA: Diagnosis present

## 2017-04-29 DIAGNOSIS — Z87891 Personal history of nicotine dependence: Secondary | ICD-10-CM

## 2017-04-29 DIAGNOSIS — M6282 Rhabdomyolysis: Secondary | ICD-10-CM | POA: Diagnosis not present

## 2017-04-29 DIAGNOSIS — R600 Localized edema: Secondary | ICD-10-CM | POA: Diagnosis not present

## 2017-04-29 DIAGNOSIS — R269 Unspecified abnormalities of gait and mobility: Secondary | ICD-10-CM | POA: Diagnosis present

## 2017-04-29 DIAGNOSIS — T796XXA Traumatic ischemia of muscle, initial encounter: Secondary | ICD-10-CM | POA: Diagnosis not present

## 2017-04-29 DIAGNOSIS — R627 Adult failure to thrive: Secondary | ICD-10-CM | POA: Diagnosis not present

## 2017-04-29 DIAGNOSIS — M5416 Radiculopathy, lumbar region: Secondary | ICD-10-CM | POA: Diagnosis not present

## 2017-04-29 DIAGNOSIS — M25512 Pain in left shoulder: Secondary | ICD-10-CM | POA: Diagnosis not present

## 2017-04-29 DIAGNOSIS — W07XXXA Fall from chair, initial encounter: Secondary | ICD-10-CM | POA: Diagnosis present

## 2017-04-29 DIAGNOSIS — Z79899 Other long term (current) drug therapy: Secondary | ICD-10-CM

## 2017-04-29 DIAGNOSIS — R296 Repeated falls: Secondary | ICD-10-CM | POA: Diagnosis not present

## 2017-04-29 DIAGNOSIS — M48061 Spinal stenosis, lumbar region without neurogenic claudication: Secondary | ICD-10-CM | POA: Diagnosis not present

## 2017-04-29 DIAGNOSIS — I10 Essential (primary) hypertension: Secondary | ICD-10-CM | POA: Diagnosis not present

## 2017-04-29 DIAGNOSIS — F0390 Unspecified dementia without behavioral disturbance: Secondary | ICD-10-CM | POA: Diagnosis present

## 2017-04-29 DIAGNOSIS — K219 Gastro-esophageal reflux disease without esophagitis: Secondary | ICD-10-CM | POA: Diagnosis present

## 2017-04-29 DIAGNOSIS — F29 Unspecified psychosis not due to a substance or known physiological condition: Secondary | ICD-10-CM | POA: Diagnosis not present

## 2017-04-29 DIAGNOSIS — Z8546 Personal history of malignant neoplasm of prostate: Secondary | ICD-10-CM

## 2017-04-29 LAB — COMPREHENSIVE METABOLIC PANEL
ALBUMIN: 3.4 g/dL — AB (ref 3.5–5.0)
ALK PHOS: 43 U/L (ref 38–126)
ALT: 15 U/L — AB (ref 17–63)
AST: 28 U/L (ref 15–41)
Anion gap: 8 (ref 5–15)
BUN: 9 mg/dL (ref 6–20)
CALCIUM: 8.9 mg/dL (ref 8.9–10.3)
CO2: 30 mmol/L (ref 22–32)
Chloride: 104 mmol/L (ref 101–111)
Creatinine, Ser: 1.11 mg/dL (ref 0.61–1.24)
GFR calc Af Amer: >60 mL/min (ref >60)
GFR calc non Af Amer: >60 mL/min (ref >60)
GLUCOSE: 111 mg/dL — AB (ref 65–99)
Potassium: 3.3 mmol/L — ABNORMAL LOW (ref 3.5–5.1)
SODIUM: 142 mmol/L (ref 135–145)
Total Bilirubin: 0.7 mg/dL (ref 0.3–1.2)
Total Protein: 6.3 g/dL — ABNORMAL LOW (ref 6.5–8.1)

## 2017-04-29 LAB — CK: Total CK: 1155 U/L — ABNORMAL HIGH (ref 49–397)

## 2017-04-29 NOTE — Evaluation (Signed)
Physical Therapy Evaluation Patient Details Name: Evan Charles MRN: [id number] DOB: [birth year] Today's Date: 04/29/2017   History of Present Illness  71 y/o M with PMHx of dementia with intermittent psychosis, intractable low back pain secondary to DDD of lumbar spine, impaired mobility and ADLs who presents from home for evaluation of BL thigh spasms, chronic back pain and difficulty ambulating secondary to the above.  Clinical Impression  Pt is up to stand with 2 assist then finally one but unsafe and listing backward with no warning.  He is not understanding PT explanation of need to be st ronger to be safe at home, but may have family that can step in to let him go.  Will recommend SNF due to safety and strength, and will follow acutely to progress gait and standing balance as much as possible to shorten SNF stay per pt preference.    Follow Up Recommendations SNF    Equipment Recommendations  None recommended by PT    Recommendations for Other Services       Precautions / Restrictions Precautions Precautions: Fall Restrictions Weight Bearing Restrictions: No      Mobility  Bed Mobility               General bed mobility comments: pt is up in chair when PT arrived  Transfers Overall transfer level: Needs assistance Equipment used: Rolling walker (2 wheeled) Transfers: Sit to/from Stand Sit to Stand: Max assist;From elevated surface;+2 safety/equipment;+2 physical assistance         General transfer comment: power up is his main need  Ambulation/Gait             General Gait Details: attempted but pt lists backward unpredictably and will need 2 person assist to attempt  Stairs            Wheelchair Mobility    Modified Rankin (Stroke Patients Only)       Balance Overall balance assessment: Needs assistance Sitting-balance support: Feet supported;Single extremity supported Sitting balance-Leahy Scale: Poor Sitting balance - Comments:  requires assistance to control unsupported sitting, leans back and to L Postural control: Left lateral lean;Posterior lean Standing balance support: Bilateral upper extremity supported Standing balance-Leahy Scale: Poor                               Pertinent Vitals/Pain Pain Assessment: 0-10 Pain Score: 7  Pain Location: back and thighs Pain Descriptors / Indicators: Aching;Sore Pain Intervention(s): Monitored during session;Premedicated before session;Repositioned    Home Living Family/patient expects to be discharged to:: Private residence Living Arrangements: Alone Available Help at Discharge: Family;Available PRN/intermittently Type of Home: House Home Access: Stairs to enter Entrance Stairs-Rails: Psychiatric nurse of Steps: 4 Home Layout: One level Home Equipment: Walker - 2 wheels;Cane - single point;Bedside commode;Shower seat Additional Comments: information from previous admission as pt cannot recall details    Prior Function Level of Independence: Needs assistance   Gait / Transfers Assistance Needed: RW for gait  ADL's / Homemaking Assistance Needed: nephew helps with errands and appts, food from [employer]        Hand Dominance   Dominant Hand: Right    Extremity/Trunk Assessment   Upper Extremity Assessment Upper Extremity Assessment: LUE deficits/detail LUE Deficits / Details: weak grip    Lower Extremity Assessment Lower Extremity Assessment: LLE deficits/detail RLE Deficits / Details: weakness on LLE at all joints LLE Deficits / Details: coordination and strength,  limited L knee flexion LLE Coordination: decreased fine motor;decreased gross motor    Cervical / Trunk Assessment Cervical / Trunk Assessment: Normal  Communication   Communication: No difficulties  Cognition Arousal/Alertness: Awake/alert Behavior During Therapy: Agitated;WFL for tasks assessed/performed Overall Cognitive Status: History of  cognitive impairments - at baseline                                 General Comments: Pt is not able to recall the level of assistance he previously needed, very minimal in his estimation      General Comments General comments (skin integrity, edema, etc.): Pt's attention to tasks is a major limitation but is not understanding why he cannot walk at home, limited safety awareness    Exercises Other Exercises Other Exercises: ROM to ankles O deg DF, hips in flexion contractures Other Exercises: LLE is weaker than RLE,    Assessment/Plan    PT Assessment Patient needs continued PT services  PT Problem List Decreased strength;Decreased range of motion;Decreased activity tolerance;Decreased balance;Decreased mobility;Decreased coordination;Decreased cognition;Decreased knowledge of use of DME;Decreased safety awareness;Decreased skin integrity;Pain       PT Treatment Interventions DME instruction;Gait training;Stair training;Functional mobility training;Therapeutic activities;Therapeutic exercise;Balance training;Neuromuscular re-education;Patient/family education    PT Goals (Current goals can be found in the Care Plan section)  Acute Rehab PT Goals Patient Stated Goal: return home PT Goal Formulation: With patient Time For Goal Achievement: 05/13/17 Potential to Achieve Goals: Good    Frequency Min 3X/week   Barriers to discharge Inaccessible home environment;Decreased caregiver support stairs to enter, has limited home help    Co-evaluation               AM-PAC PT "6 Clicks" Daily Activity  Outcome Measure Difficulty turning over in bed (including adjusting bedclothes, sheets and blankets)?: Total Difficulty moving from lying on back to sitting on the side of the bed? : Total Difficulty sitting down on and standing up from a chair with arms (e.g., wheelchair, bedside commode, etc,.)?: Total Help needed moving to and from a bed to chair (including a  wheelchair)?: A Lot Help needed walking in hospital room?: A Lot Help needed climbing 3-5 steps with a railing? : Total 6 Click Score: 8    End of Session Equipment Utilized During Treatment: Gait belt Activity Tolerance: Patient tolerated treatment well;Patient limited by fatigue;Other (comment) (weakness on RLE) Patient left: in chair;with call bell/phone within reach;with chair alarm set Nurse Communication: Mobility status PT Visit Diagnosis: Unsteadiness on feet (R26.81);Muscle weakness (generalized) (M62.81);Difficulty in walking, not elsewhere classified (R26.2);Other (comment) (appearance of hemiparesis)    Time: 4481-8563 PT Time Calculation (min) (ACUTE ONLY): 40 min   Charges:   PT Evaluation $PT Eval Moderate Complexity: 1 Procedure PT Treatments $Gait Training: 8-22 mins $Therapeutic Exercise: 8-22 mins   PT G Codes:   PT G-Codes **NOT FOR INPATIENT CLASS** Functional Assessment Tool Used: AM-PAC 6 Clicks Basic Mobility;Clinical judgement Functional Limitation: Mobility: Walking and moving around Mobility: Walking and Moving Around Current Status (J4970): At least 60 percent but less than 80 percent impaired, limited or restricted Mobility: Walking and Moving Around Goal Status [id number]): At least 1 percent but less than 20 percent impaired, limited or restricted    Ramond Dial 04/29/2017, 12:24 PM   Mee Hives, PT MS Acute Rehab Dept. Number: [place]

## 2017-04-29 NOTE — Clinical Social Work Note (Signed)
CSW received call from [facility] regarding placement. She stated that Surveyor, quantity of social work is already aware of this patient and to call. CSW discussed with Surveyor, quantity who has reviewed chart and determined that patient is custodial care and insurance would not cover any skilled rehab. Assistant asked that [place] notify RNCM to start home health process and to notify MD. [place] notified both. CSW discussed with Surveyor, quantity of social work again. Need to locate family that can help care for patient or patient will have to start Medicaid application for long-term care in which he would have to start dissolving assets. Surveyor, quantity of social work stated he would not approve an LOG for this patient.  Dayton Scrape, [place]

## 2017-04-29 NOTE — Care Management Note (Addendum)
Case Management Note  Patient Details  Name: Evan Charles MRN: [id number] Date of Birth: November 07, 1946  Subjective/Objective:     Chronic pain, weakness, spasms, FTT, Rhabdomyolysis, HTN                 Action/Plan: Discharge Planning:  NCM spoke to pt and states he had [facility] in the past for [name]. Offered choice/list provided. Contacted [facility] with new referral. Pt lives at home and has a nephew, Livingston Diones [phone] that assist him at home. Gave permission to speak to nephew. Contacted nephew and states pt is not safe to be at home alone due to his current living conditions. States he tries to help him at home but he is not able to provide 24 hour supervision. Pt states he wants to dc home but would go back to SNF rehab if it can be arranged. He was at [facility] about 2 months ago. Nephew states he can provide transportation home at dc. NCM contacted attending to make aware.   Expected Discharge Date:  04/30/17               Expected Discharge Plan:  Skilled Nursing Facility  In-House Referral:  Clinical Social Work  Discharge planning Services  CM Consult  Post Acute Care Choice:  Home Health Choice offered to:  Patient  DME Arranged:  N/A DME Agency:  NA  HH Arranged:  RN, PT, OT, Nurse's Aide, Social Work [employer] Agency:  [facility] [employer]  Status of Service:  In process, will continue to follow  If discussed at Long Length of Stay Meetings, dates discussed:    Additional Comments:  Erenest Rasher, RN 04/29/2017, 6:00 PM

## 2017-04-29 NOTE — Progress Notes (Signed)
   Subjective: Complains of chronic pain this morning. No new complaints. Feels better since admission. Asked about OT's concern for left-sided leaning -- reports he has done this for several months. No numbness or tingling.   Objective:  Vital signs in last 24 hours: Vitals:   04/29/17 0545 04/29/17 0946 04/29/17 0949 04/29/17 1400  BP: 131/79 116/61 116/61 126/81  Pulse: 67 82  75  Resp: 18   16  Temp:    97.4 F (36.3 C)  TempSrc:    Oral  SpO2: 92%   100%  Weight: 175 lb 9.6 oz (79.7 kg)      General: NAD. Resting comfortably in bedside chair looking out window. Feet elevated.  HEENT: Normocephalic, atraumatic, anicteric sclera Neck: Supple, trachea midline Lungs: Clear to auscultation bilaterally. Breathing comfortably on room air Heart: Regular rate and rhythm, no murmur/rub/gallop Abdomen: Soft, nontender, nondistended Extremities: Warm. Edema improved. Ted hose in place.  Neurologic: Alert and interactive. No gross deficits.  Assessment/Plan: 71 y/o M with MHx of dementia with intermittent psychosis, intractable low back pain secondary to DDD/spinal stenosis of lumbar spine, impaired mobility and impaired ADLs who presents from home for evaluation of BL thigh spasms, chronic back pain and difficulty ambulating secondary to the above. Found to be covered in feces, urine and food.   # Muscle Weakness and Spasms ## Advanced Multi-level Lumbar Spinal Stenosis with Chronic Pain and Radiculopathy Multiple presentations for the same. PT recommending SNF. OT recommending same. Noted exam findings of OT - was not able to appreciate on our exam. CT head was neg.  -Tylenol prn - K pad  -Toradol if ineffective -Please limit use of narcotics in this patient with dementia -CSW consult, appreciate assistance with SNF placement  # Failure to Thrive ## Dementia with Hallucinations This patient requires significantly more care than he is receiving at home and would benefit from  long-term placement.  -Consider recontacting APS, will follow up on CSW consult -Continue Seroquel 50 mg QHS and Rivastigmine 1.5 mg BID  # Rhabdomyolysis: CK ~1200, renal function stable. No evidence for compartment syndrome on examination. Likely from patients long down-time secondary to immobility. Given 500 cc in ED. Cr stable this morning and CK down to 1155 from 1256. Continue to monitor.  # BL LE Edema 2/2 dependent edema -Continue Ted hose and elevate LE's. -Daily weights  # HTN Continue Amlodipine 5mg  and HCTZ 12.5 mg daily  Diet: HH/carb mod IVF: None Code status: DNI - does want chest compressions, shocks DVT ppx: lovenox  Dispo: Anticipated discharge in approximately 1-2 day(s).   Cory Kitt, DO 04/29/2017, 2:25 PM Pager: [phone]

## 2017-04-29 NOTE — Evaluation (Signed)
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   Clinical Impression   Pt reports his nephew occasionally assists with BADL PTA but for the most part pt is able to manage at home alone. Pt currently requires max assist for sit to stand from EOB and min assist for stand pivot; pt with L lateral lean in sitting and posterior lean in standing. Pt requiring min assist for seated UB ADL and max assist for LB ADL. Recommending SNF for follow up to maximize independence and safety with ADL and functional mobility prior to return home alone. Pt would benefit from continued skilled OT to address established goals.  OF NOTE: Pt noted to have L lateral lean in sitting, L gaze preference but would track toward R side with cues, and L hand weakness when attempting to grip RW. Noted CT negative for acute findings. Notified RN of observed stroke like symptoms.    Follow Up Recommendations  SNF;Supervision/Assistance - 24 hour    Equipment Recommendations  None recommended by OT    Recommendations for Other Services PT consult     Precautions / Restrictions Precautions Precautions: Fall Restrictions Weight Bearing Restrictions: No      Mobility Bed Mobility Overal bed mobility: Needs Assistance Bed Mobility: Supine to Sit     Supine to sit: Min assist     General bed mobility comments: Min assist for trunk elevation to sitting. Increased time required; HOB slightly elevated and use of bed rail  Transfers Overall transfer level: Needs assistance Equipment used: Rolling walker (2 wheeled) Transfers: Sit to/from  [facility] Sit to Stand: Max assist;From elevated surface Stand pivot transfers: Min assist       General transfer comment: Max assist to boost up from EOB; pt with strong posterior lean requiring cues for upright posture. Once in standing pt able to perform stand pivot with min assist    Balance Overall balance assessment: Needs assistance Sitting-balance support: Feet supported;Single extremity supported Sitting balance-Leahy Scale: Poor Sitting balance - Comments: L lateral lean in sitting. Able to correct with cues. Postural control: Left lateral lean;Posterior lean Standing balance support: Bilateral upper extremity supported Standing balance-Leahy Scale: Poor Standing balance comment: Strong posterior lean in standing, able to correct with increased time and cues.                           ADL either performed or assessed with clinical judgement   ADL Overall ADL's : Needs assistance/impaired Eating/Feeding: Set up;Sitting   Grooming: Minimal assistance;Sitting   Upper Body Bathing: Minimal assistance;Sitting   Lower Body Bathing: Maximal assistance;Sit to/from stand   Upper Body Dressing : Minimal assistance;Sitting   Lower Body Dressing: Maximal assistance;Sit to/from stand   Toilet Transfer: Maximal assistance;Minimal assistance;Stand-pivot;RW Armed forces technical officer Details (indicate cue type and reason): Simulated by sit to stand from EOB with max assist and stand pivot transfer EOB>chair with min assist.         Functional mobility during ADLs: Minimal assistance;Rolling walker (max assist for sit to stand, min assist for stand pivot) General ADL Comments: Pt reports he does not want to  go back to SNF, has been to 6 different ones recently and feels that he has learned everything from therapy. Reports he just wants to go home, at least until December when he gets his house paid off.     Vision Baseline Vision/History: Wears glasses Wears  Glasses: At all times Additional Comments: Seems to have L gaze preference but does track to R side with cues.     Perception     Praxis      Pertinent Vitals/Pain Pain Assessment: 0-10 Pain Score: 7  Pain Location: back, bil LEs Pain Descriptors / Indicators: Aching;Sore;Spasm Pain Intervention(s): Monitored during session;Limited activity within patient's tolerance;Repositioned     Hand Dominance Right   Extremity/Trunk Assessment Upper Extremity Assessment Upper Extremity Assessment: Generalized weakness (L hand difficulty gripping RW)   Lower Extremity Assessment Lower Extremity Assessment: Defer to PT evaluation       Communication Communication Communication: No difficulties   Cognition Arousal/Alertness: Awake/alert Behavior During Therapy: WFL for tasks assessed/performed Overall Cognitive Status: No family/caregiver present to determine baseline cognitive functioning                                 General Comments: Hx of dementia. Pt oriented to person, place, and situation. Thought it was 2020 or 1019 for the year.    General Comments       Exercises     Shoulder Instructions      Home Living Family/patient expects to be discharged to:: Private residence Living Arrangements: Alone Available Help at Discharge: Family;Available PRN/intermittently Type of Home: House Home Access: Stairs to enter [employer] of Steps: 4 Entrance Stairs-Rails: Right;Left Home Layout: One level     Bathroom Shower/Tub: Walk-in shower;Tub/shower unit   Bathroom Toilet: Standard     Home Equipment: Environmental consultant - 2 wheels;Cane - single point;Bedside commode;Shower seat          Prior Functioning/Environment Level of Independence: Needs assistance  Gait / Transfers Assistance Needed: Pt reports he uses RW for mobility ADL's / Homemaking Assistance Needed: Reports he uses Me[employer] on Wheels for food. Nephew assisting with dressing and home management  tasks            OT Problem List: Decreased strength;Impaired balance (sitting and/or standing);Impaired vision/perception;Decreased cognition;Decreased safety awareness;Decreased knowledge of use of DME or AE;Decreased knowledge of precautions;Obesity;Pain;Increased edema      OT Treatment/Interventions: Self-care/ADL training;Therapeutic exercise;Energy conservation;DME and/or AE instruction;Therapeutic activities;Cognitive remediation/compensation;Patient/family education;Balance training    OT Goals(Current goals can be found in the care plan section) Acute Rehab OT Goals Patient Stated Goal: return home OT Goal Formulation: With patient Time For Goal Achievement: 05/13/17 Potential to Achieve Goals: Good ADL Goals Pt Will Perform Grooming: with supervision;sitting Pt Will Perform Upper Body Bathing: with supervision;sitting Pt Will Perform Lower Body Bathing: with supervision;sit to/from stand Pt Will Transfer to Toilet: with supervision;ambulating;bedside commode (over toilet) Pt Will Perform Toileting - Clothing Manipulation and hygiene: with supervision;sit to/from stand Pt Will Perform Tub/Shower Transfer: Shower transfer;with supervision;ambulating;shower seat;rolling walker  OT Frequency: Min 2X/week   Barriers to D/C: Decreased caregiver support  pt lives alone       Co-evaluation              AM-PAC PT "6 Clicks" Daily Activity     Outcome Measure Help from another person eating meals?: A Little Help from another person taking care of personal grooming?: A Little Help from another person  toileting, which includes using toliet, bedpan, or urinal?: A Lot Help from another person bathing (including washing, rinsing, drying)?: A Lot Help from another person to put on and taking off regular upper body clothing?: A Little Help from another person to put on and taking off regular lower body clothing?: A Lot 6 Click Score: 15   End of Session Equipment Utilized  During Treatment: Gait belt;Rolling walker Nurse Communication: Mobility status  Activity Tolerance: Patient tolerated treatment well Patient left: in chair;with call bell/phone within reach;with chair alarm set  OT Visit Diagnosis: Unsteadiness on feet (R26.81);Other abnormalities of gait and mobility (R26.89);Muscle weakness (generalized) (M62.81);Pain Pain - Right/Left:  (both) Pain - part of body:  (back, legs)                Time: 8937-3428 OT Time Calculation (min): 19 min Charges:  OT General Charges $OT Visit: 1 Procedure OT Evaluation $OT Eval Moderate Complexity: 1 Procedure G-Codes: OT G-codes **NOT FOR INPATIENT CLASS** Functional Assessment Tool Used: AM-PAC 6 Clicks Daily Activity Functional Limitation: Self care Self Care Current Status (J6811): At least 40 percent but less than 60 percent impaired, limited or restricted Self Care Goal Status (X7262): At least 1 percent but less than 20 percent impaired, limited or restricted   Mel Almond A. Ulice Brilliant, M.S., OTR/L Pager: [place] 04/29/2017, 8:46 AM

## 2017-04-30 ENCOUNTER — Observation Stay (HOSPITAL_COMMUNITY): Payer: Medicare Other

## 2017-04-30 DIAGNOSIS — M48061 Spinal stenosis, lumbar region without neurogenic claudication: Secondary | ICD-10-CM

## 2017-04-30 DIAGNOSIS — R29898 Other symptoms and signs involving the musculoskeletal system: Secondary | ICD-10-CM

## 2017-04-30 DIAGNOSIS — G8929 Other chronic pain: Secondary | ICD-10-CM | POA: Diagnosis not present

## 2017-04-30 DIAGNOSIS — F29 Unspecified psychosis not due to a substance or known physiological condition: Secondary | ICD-10-CM

## 2017-04-30 DIAGNOSIS — F0390 Unspecified dementia without behavioral disturbance: Secondary | ICD-10-CM

## 2017-04-30 DIAGNOSIS — I10 Essential (primary) hypertension: Secondary | ICD-10-CM | POA: Diagnosis not present

## 2017-04-30 DIAGNOSIS — Z87891 Personal history of nicotine dependence: Secondary | ICD-10-CM

## 2017-04-30 DIAGNOSIS — M6282 Rhabdomyolysis: Secondary | ICD-10-CM | POA: Diagnosis not present

## 2017-04-30 DIAGNOSIS — M5416 Radiculopathy, lumbar region: Secondary | ICD-10-CM

## 2017-04-30 LAB — BASIC METABOLIC PANEL
ANION GAP: 9 (ref 5–15)
BUN: 9 mg/dL (ref 6–20)
CO2: 30 mmol/L (ref 22–32)
Calcium: 9 mg/dL (ref 8.9–10.3)
Chloride: 101 mmol/L (ref 101–111)
Creatinine, Ser: 0.93 mg/dL (ref 0.61–1.24)
GFR calc Af Amer: >60 mL/min (ref >60)
Glucose, Bld: 80 mg/dL (ref 65–99)
POTASSIUM: 3.6 mmol/L (ref 3.5–5.1)
SODIUM: 140 mmol/L (ref 135–145)

## 2017-04-30 LAB — CBC
HEMATOCRIT: 42.2 % (ref 39.0–52.0)
HEMOGLOBIN: 13.2 g/dL (ref 13.0–17.0)
MCH: 27.1 pg (ref 26.0–34.0)
MCHC: 31.3 g/dL (ref 30.0–36.0)
MCV: 86.7 fL (ref 78.0–100.0)
Platelets: 222 10*3/uL (ref 150–400)
RBC: 4.87 MIL/uL (ref 4.22–5.81)
RDW: 14.6 % (ref 11.5–15.5)
WBC: 4.4 10*3/uL (ref 4.0–10.5)

## 2017-04-30 LAB — CK: Total CK: 880 U/L — ABNORMAL HIGH (ref 49–397)

## 2017-04-30 MED ORDER — DOCUSATE SODIUM 100 MG PO CAPS: 100.0000 mg | ORAL_CAPSULE | Freq: Two times a day (BID) | ORAL | Status: AC | PRN

## 2017-04-30 MED ORDER — AMLODIPINE BESYLATE 5 MG PO TABS: 5.0000 mg | ORAL_TABLET | Freq: Every day | ORAL | 1 refills | Status: DC

## 2017-04-30 MED ORDER — HYDROCHLOROTHIAZIDE 12.5 MG PO CAPS: 12.5000 mg | ORAL_CAPSULE | Freq: Every day | ORAL | 1 refills | Status: DC

## 2017-04-30 MED ORDER — LORAZEPAM 2 MG/ML IJ SOLN
1.0000 mg | Freq: Once | INTRAMUSCULAR | Status: AC | PRN
  Administered 2017-04-30: 1 mg via INTRAVENOUS
  Filled 2017-04-30: qty 1

## 2017-04-30 NOTE — Progress Notes (Signed)
Medicine attending: I examined this patient today and I concur with the evaluation and management plan as recorded by resident physician Dr. Romelle Starcher Molt except for any changes noted below. Back on his medications, he now denies any hallucinations.  We had a very open discussion about his strong desire to return to his home.  He states that he only has 3 months left on a 71 year old mortgage then the house will be his he does not want to go to a nursing facility.  When questioned about the shape of his house as reported by the ambulance crew, he states that he was just being lazy and that if he goes back home he will clean up his act.  He states that his nephew looks in on him.  Our social work staff has contacted the nephew and the nephew does not feel he is safe to be alone in his current environment.  I think we might be able to reach a compromise if we can get additional services provided for him in his home.  He is Medicare age.  We will look into his eligibility to participate in the [facility] program that specifically assist patients with frequent readmissions to the hospital with a visiting health care provider team to monitor his status and medication compliance. Although he appears competent to make his own decisions, we would still like psychiatry to reevaluate him before I feel comfortable signing his discharge. With respect to the problem he presented with, acute on chronic back pain and associated muscle spasms, these symptoms have subsided.  On my neurologic exam today he has 5/5 motor strength of his lower extremities and reflexes are 1+ and symmetric at the knees.  I would like to get a follow-up MRI to see if there are any surgical options for him.

## 2017-04-30 NOTE — Care Management Obs Status (Signed)
[facility] NOTIFICATION   Patient Details  Name: Evan Charles MRN: [id number] Date of Birth: July 19, 1946   Medicare Observation Status Notification Given:  Yes    Carles Collet, RN 04/30/2017, 1:40 PM

## 2017-04-30 NOTE — Progress Notes (Signed)
   Subjective: Seen and evaluated today at bedside. No new complaints, pleading for discharge. Doesn't appear to have AMS today on exam. Notes has only 3 months left of his mortgage and wants to do everything possible to stay at home. Noted that his situation was caused by laziness and seems motivated to address.   Objective:  Vital signs in last 24 hours: Vitals:   04/29/17 2149 04/30/17 0500 04/30/17 0636 04/30/17 1026  BP: 127/89  (!) 138/91 119/73  Pulse: 71  71   Resp: 18  18   Temp: 98 F (36.7 C)  97.7 F (36.5 C)   TempSrc: Oral     SpO2: 95%  100%   Weight:  175 lb 12.8 oz (79.7 kg)    Height:       General: NAD. Resting comfortably in bedside chair looking out window. Feet elevated.  HEENT: Normocephalic, atraumatic, anicteric sclera Neck: Supple, trachea midline Lungs: Clear to auscultation bilaterally. Breathing comfortably on room air Heart: Regular rate and rhythm, no murmur/rub/gallop Abdomen: Soft, nontender, nondistended Extremities: Warm. Edema improved. Ted hose in place.  Neurologic: Alert and interactive. No gross deficits.  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 Same reason for presenting for the past several admissions this year. Last MR ~1 year ago with mod-advanced spinal stenosis. Has hx of prior lumbar decompression and microdiskectomy. In setting of multiple presentations for the same + acute worsening, will order repeat MRI +/- ortho/neurosurg consultation.  -PT recs SNF/24 hour supervision/assistance. [name] told CM hes unable to provide 24 hour care. -Tylenol prn - K pad  -Please limit use of narcotics in this patient with dementia -CSW on board, appreciate assistance. Wonder [facility] could be involved? If he is discharged home he will need HH PT, OT, RN,  aid  #Chronic Mental Illness with Hx of Psychosis + hallucinations ##Dementia Patient is quite insistent on his return home. His mental status was clear this morning and rational conversation was had today. This seems to be consistent with history of prior admission. Admitted that the condition of his home was secondary to laziness and seems somewhat motivated to improve.   Continues to have hallucinations, improved. Notes he ran out of his Seroquel at home and [place] did not mail the refill in time. States Rx is at home.  -Have consulted psychiatry.  -Continue Seroquel 50 mg QHS and Rivastigmine 1.5 mg BID  # Rhabdomyolysis: This mornings labs are pending. Yesterday CK was down trending and renal function stable. -FU labs  # BL LE Edema 2/2 dependent edema Improved.  -Continue Ted hose and elevate LE's.  -Daily weights  # HTN BP stable. Continue Amlodipine 5mg  and HCTZ 12.5 mg daily  Dispo: Anticipated discharge today depending on psychiatry evaluation and adequate support.   Cheynne Virden, DO 04/30/2017, 11:26 AM Pager: [phone]

## 2017-04-30 NOTE — Consult Note (Signed)
[facility] Face-to-Face Psychiatry Consult   Reason for Consult:  Capacity evaluation Referring Physician:  Dr. Danford Bad Patient Identification: Evan Charles MRN:  [id number] Principal Diagnosis: Spinal stenosis of lumbar region with radiculopathy Diagnosis:   Patient Active Problem List   Diagnosis Date Noted  . Leg weakness [R29.898] 04/28/2017  . Spinal stenosis of lumbar region with radiculopathy [M48.061, M54.16] 04/28/2017  . Impaired mobility and ADLs [Z74.09]   . Hypokalemia [E87.6] 11/05/2016  . 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 Acute delirium [R41.0] 09/08/2016  . Rhabdomyolysis [M62.82] 09/08/2016  . Normocytic anemia [D64.9] 09/08/2016  . Altered mental status [R41.82] 09/08/2016  . Fall at home [W19.XXXA, Y92.009]   . UTI (lower urinary tract infection) [N39.0] 07/25/2016  . Mild cognitive impairment [G31.84] 07/25/2016  . 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 Immobility [Z74.09] 03/20/2016  . Generalized weakness [R53.1] 03/20/2016  . Failure to thrive in adult [R62.7] 03/20/2016  . Neuropathy [G62.9]   . Essential hypertension [I10]   . Physical deconditioning [R53.81]   . 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 GERD without esophagitis [K21.9] 12/19/2015  . Bilateral lower extremity edema [R60.0] 12/19/2015  . Intractable low back pain [M54.5] 02/14/2015  . Difficulty walking [R26.2] 02/14/2015  . H/O: upper GI bleed [Z87.19]   . Bilateral back pain [M54.9]   . Visual hallucinations [R44.1]   . Psychoses [F29]     Total Time spent with patient: 1 hour  Subjective:   Evan Charles is a 71 y.o. male patient admitted with leg swelling and spasms.  HPI: Evan Charles is a 71 years old admitted to hospital with the history of dementia with intermittent psychosis, no back pain presented with bilateral leg swelling and spasms. Patient reported he lives by himself and has a people to supervise him and help him.  Patient reported his nephew is helping him to do shopping. Patient reported visual hallucinations of 2 or 3 people at his home who disappears when somebody knocked the door and they do not bother him and also a nurse him. Patient reported at one time he tried to touch them but he could not feel anything. Patient stated I have a hallucinations and then describes about 3 people with different colors much younger age than him talking without sound that he can hear. Patient also reported his physician provided her medication Seroquel 50 mg at bedtime which is helping him to control the hallucinations also sleep well without difficulties. Patient has intact cognitions including orientation, concentration, memory and language functions. Patient knows his first name, last name, name of the hospital, the hospital floor number and reason for the hospitalization and names of the children and nephew. Patient is able to recall 3 out of 3 objects immediately and 2 out of 3 at delayed recall. Patient reported he gets food from [employer] and spend time watching television and occasionally diminished with friends. Patient has no problem making phone calls with other people. Patient living condition is inadequate by the EMS report, will recommend home health care services as he is not willing to be placed out of home.  Past Psychiatric History: Unspecified psychosis but knows psychiatric treatment.  Risk to Self: Is patient at risk for suicide?: No Risk to Others:   Prior Inpatient Therapy:   Prior Outpatient Therapy:    Past Medical History:  Past Medical History:  Diagnosis Date  . Back pain   . Chronic mental illness   . Colitis   .  Dementia   . GERD (gastroesophageal reflux disease)   . Hypertension   . Neuropathy    " MY HANDS "  . Prostate cancer [facility])     Past Surgical History:  Procedure Laterality Date  . KNEE ARTHROSCOPY    . LAMINECTOMY    . PROSTATECTOMY     Family History:  Family  History  Problem Relation Age of Onset  . Cancer Brother   . Cancer Maternal Grandmother    Family Psychiatric  History: Unknown Social History:  History  Alcohol Use No     History  Drug Use No    Social History   Social History  . Marital status: Single    Spouse name: N/A  . Number of children: N/A  . Years of education: N/A   Occupational History  . Retired    Social History Main Topics  . Smoking status: Former Smoker    Types: Cigarettes  . Smokeless tobacco: Never Used  . Alcohol use No  . Drug use: No  . Sexual activity: Not Asked   Other Topics Concern  . None   Social History Narrative   Cares for self at home. Has friends to help and uses meals on wheels.    Additional Social History:    Allergies:  No Known Allergies  Labs:  Results for orders placed or performed during the hospital encounter of 04/28/17 (from the past 48 hour(s))  CK     Status: Abnormal   Collection Time: 04/29/17  6:24 AM  Result Value Ref Range   Total CK 1,155 (H) 49 - 397 U/L  Comprehensive metabolic panel     Status: Abnormal   Collection Time: 04/29/17  6:24 AM  Result Value Ref Range   Sodium 142 135 - 145 mmol/L   Potassium 3.3 (L) 3.5 - 5.1 mmol/L   Chloride 104 101 - 111 mmol/L   CO2 30 22 - 32 mmol/L   Glucose, Bld 111 (H) 65 - 99 mg/dL   BUN 9 6 - 20 mg/dL   Creatinine, Ser 1.11 0.61 - 1.24 mg/dL   Calcium 8.9 8.9 - 10.3 mg/dL   Total Protein 6.3 (L) 6.5 - 8.1 g/dL   Albumin 3.4 (L) 3.5 - 5.0 g/dL   AST 28 15 - 41 U/L   ALT 15 (L) 17 - 63 U/L   Alkaline Phosphatase 43 38 - 126 U/L   Total Bilirubin 0.7 0.3 - 1.2 mg/dL   GFR calc non Af Amer >60 >60 mL/min   GFR calc Af Amer >60 >60 mL/min    Comment: (NOTE) The eGFR has been calculated using the CKD EPI equation. This calculation has not been validated in all clinical situations. eGFR's persistently <60 mL/min signify possible Chronic Kidney Disease.    Anion gap 8 5 - 15  Basic metabolic panel      Status: None   Collection Time: 04/30/17 12:27 PM  Result Value Ref Range   Sodium 140 135 - 145 mmol/L   Potassium 3.6 3.5 - 5.1 mmol/L   Chloride 101 101 - 111 mmol/L   CO2 30 22 - 32 mmol/L   Glucose, Bld 80 65 - 99 mg/dL   BUN 9 6 - 20 mg/dL   Creatinine, Ser 0.93 0.61 - 1.24 mg/dL   Calcium 9.0 8.9 - 10.3 mg/dL   GFR calc non Af Amer >60 >60 mL/min   GFR calc Af Amer >60 >60 mL/min    Comment: (NOTE) The eGFR has  been calculated using the CKD EPI equation. This calculation has not been validated in all clinical situations. eGFR's persistently <60 mL/min signify possible Chronic Kidney Disease.    Anion gap 9 5 - 15  CBC     Status: None   Collection Time: 04/30/17 12:27 PM  Result Value Ref Range   WBC 4.4 4.0 - 10.5 K/uL   RBC 4.87 4.22 - 5.81 MIL/uL   Hemoglobin 13.2 13.0 - 17.0 g/dL   HCT 42.2 39.0 - 52.0 %   MCV 86.7 78.0 - 100.0 fL   MCH 27.1 26.0 - 34.0 pg   MCHC 31.3 30.0 - 36.0 g/dL   RDW 14.6 11.5 - 15.5 %   Platelets 222 150 - 400 K/uL  CK     Status: Abnormal   Collection Time: 04/30/17 12:27 PM  Result Value Ref Range   Total CK 880 (H) 49 - 397 U/L    Current Facility-Administered Medications  Medication Dose Route Frequency Provider Last Rate Last Dose  . acetaminophen (TYLENOL) tablet 650 mg  650 mg Oral Q6H PRN Zada Finders, MD   650 mg at 04/30/17 1029   Or  . acetaminophen (TYLENOL) suppository 650 mg  650 mg Rectal Q6H PRN Zada Finders, MD      . amLODipine (NORVASC) tablet 5 mg  5 mg Oral Daily Zada Finders, MD   5 mg at 04/30/17 1027  . cholecalciferol (VITAMIN D) tablet 1,000 Units  1,000 Units Oral Daily Zada Finders, MD   1,000 Units at 04/30/17 1026  . docusate sodium (COLACE) capsule 100 mg  100 mg Oral BID Zada Finders, MD   100 mg at 04/30/17 1026  . enoxaparin (LOVENOX) injection 40 mg  40 mg Subcutaneous Q24H Zada Finders, MD   40 mg at 04/29/17 2203  . hydrochlorothiazide (MICROZIDE) capsule 12.5 mg  12.5 mg Oral Daily Zada Finders, MD   12.5 mg at 04/30/17 1026  . pantoprazole (PROTONIX) EC tablet 40 mg  40 mg Oral BID Zada Finders, MD   40 mg at 04/30/17 1026  . QUEtiapine (SEROQUEL) tablet 50 mg  50 mg Oral QHS Zada Finders, MD   50 mg at 04/29/17 2203  . rivastigmine (EXELON) capsule 1.5 mg  1.5 mg Oral BID Zada Finders, MD   1.5 mg at 04/30/17 1026    Musculoskeletal: Strength & Muscle Tone: decreased Gait & Station: unable to stand Patient leans: N/A  Psychiatric Specialty Exam: Physical Exam as per history and physical   ROS  complaining about leg swelling and spasms  No Fever-chills, No Headache, No changes with Vision or hearing, reports vertigo No problems swallowing food or Liquids, No Chest pain, Cough or Shortness of Breath, No Abdominal pain, No Nausea or Vommitting, Bowel movements are regular, No Blood in stool or Urine, No dysuria, No new skin rashes or bruises, No new joints pains-aches,  No new weakness, tingling, numbness in any extremity, No recent weight gain or loss, No polyuria, polydypsia or polyphagia,  A full 10 point Review of Systems was done, except as stated above, all other Review of Systems were negative.  Blood pressure 131/76, pulse 75, temperature 97.8 F (36.6 C), temperature source Oral, resp. rate 18, height 5' 9"  (1.753 m), weight 79.7 kg (175 lb 12.8 oz), SpO2 100 %.Body mass index is 25.96 kg/m.  General Appearance: Casual  Eye Contact:  Good  Speech:  Clear and Coherent  Volume:  Decreased  Mood:  Euthymic  Affect:  Constricted  Thought Process:  Coherent and Goal Directed  Orientation:  Full (Time, Place, and Person)  Thought Content:  WDL and Logical  Suicidal Thoughts:  No  Homicidal Thoughts:  No  Memory:  Immediate;   Good Recent;   Fair Remote;   Fair  Judgement:  Fair  Insight:  Fair  Psychomotor Activity:  Decreased  Concentration:  Concentration: Fair and Attention Span: Fair  Recall:  Fair 2/3 objects only  [employer] of Knowledge:  Good   Language:  Fair  Akathisia:  Negative  Handed:  Right  AIMS (if indicated):     Assets:  Communication Skills Desire for Improvement Financial Resources/Insurance Housing Leisure Time Resilience Social Support  ADL's:  Intact  Cognition:  Impaired,  Mild  Sleep:        Treatment Plan Summary:  Daily contact with patient to assess and evaluate symptoms and progress in treatment and Medication management   Unspecified psychosis  Mild dementia with intermittent visual hallucinations  Recommendation: Continue Seroquel 50 mg at bedtime  Monitor for the QTC prolongation, EKG shows QT/QTc 399/475 ms  Based on my evaluation patient has mild cognitive deficits with occasional visual hallucinations but intact cognitions and he meets criteria for capacity to make his own medical decisions. Based on his living situation will would recommend physical therapy evaluation and also home health services if possible as primary team is suggesting.  Disposition: Supportive therapy provided about ongoing stressors.  Ambrose Finland, MD 04/30/2017 3:35 PM

## 2017-05-01 DIAGNOSIS — G8929 Other chronic pain: Secondary | ICD-10-CM | POA: Diagnosis not present

## 2017-05-01 DIAGNOSIS — M48061 Spinal stenosis, lumbar region without neurogenic claudication: Secondary | ICD-10-CM | POA: Diagnosis not present

## 2017-05-01 DIAGNOSIS — M5416 Radiculopathy, lumbar region: Secondary | ICD-10-CM | POA: Diagnosis not present

## 2017-05-01 DIAGNOSIS — F29 Unspecified psychosis not due to a substance or known physiological condition: Secondary | ICD-10-CM | POA: Diagnosis not present

## 2017-05-01 MED ORDER — TRAMADOL HCL 50 MG PO TABS
50.0000 mg | ORAL_TABLET | Freq: Once | ORAL | Status: AC
Start: 2017-05-01 — End: 2017-05-01
  Administered 2017-05-01: 50 mg via ORAL
  Filled 2017-05-01: qty 1

## 2017-05-01 NOTE — Care Management Note (Signed)
Case Management Note  Patient Details  Name: AVRUM KIMBALL MRN: [id number] Date of Birth: December 20, 1945  Subjective/Objective:    Presented from home for evaluation of BL thigh spasms, chronic back pain and difficulty ambulating ,Hx of dementia with intermittent psychosis, intractable low back pain secondary to DDD of lumbar spine, impaired mobility and ADLs.  From home alone.    Action/Plan: Plan is to d/c to home with home health services today. Pt refused to go to SNF/REHAB per recommendations from PT/OT. Transportation to home will be provided by nephew, [name] @ [phone].  Expected Discharge Date:  04/30/17               Expected Discharge Plan:  [place], pt refused SNF/REHAB, insist on being d/c to home.  In-House Referral:  Clinical Social Work  Ship broker  CM Consult  Post Acute Care Choice:  Home Health Choice offered to:  Patient  DME Arranged:  N/A DME Agency:  NA  HH Arranged:  RN, PT, OT, Nurse's Aide, Social Work [employer] Agency:  [facility]/ Referral made with [employer] CM made [facility] aware pt deemed [place] per Dr. Reynaldo Minium.   Status of Service:  Completed, signed off  If discussed at [place] of Stay Meetings, dates discussed:    Additional Comments:  Sharin Mons, RN 05/01/2017, 12:16 PM
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
## 2017-05-01 NOTE — Progress Notes (Signed)
Physical Therapy Treatment Patient Details Name: Evan Charles MRN: [id number] DOB: 1946-09-01 Today's Date: 05/01/2017    History of Present Illness 71 y/o M with PMHx of dementia with intermittent psychosis, intractable low back pain secondary to DDD of lumbar spine, impaired mobility and ADLs who presents from home for evaluation of BL thigh spasms, chronic back pain and difficulty ambulating secondary to the above.    PT Comments    Pt continues to demonstrate deficits in balance, strength and coordination. In standing, pt requiring mod-max A secondary to significant posterior lean. Pt with a total LOB posteriorly in standing in which he sat back down in the chair. Pt still not able to recognize his balance deficits at that time. Pt is currently at an incredibly high risk for falls. Pt would continue to benefit from skilled physical therapy services at this time while admitted and after d/c to address the below listed limitations in order to improve overall safety and independence with functional mobility.    Follow Up Recommendations  SNF     Equipment Recommendations  None recommended by PT    Recommendations for Other Services       Precautions / Restrictions Precautions Precautions: Fall Restrictions Weight Bearing Restrictions: No    Mobility  Bed Mobility               General bed mobility comments: pt sitting OOB in recliner chair when therapist arrived  Transfers Overall transfer level: Needs assistance Equipment used: Rolling walker (2 wheeled) Transfers: Sit to/from Stand Sit to Stand: Mod assist         General transfer comment: mod A to rise into standing from chair and for stability upon achieving standing as pt with heavy posterior lean. pt performed sit<>stand from chair x2  Ambulation/Gait                 Stairs            Wheelchair Mobility    Modified Rankin (Stroke Patients Only)       Balance Overall balance  assessment: Needs assistance Sitting-balance support: Feet supported Sitting balance-Leahy Scale: Poor   Postural control: Posterior lean Standing balance support: Bilateral upper extremity supported Standing balance-Leahy Scale: Poor Standing balance comment: heavy posterior lean, requiring mod-max A to maintain standing position. pt with total LOB in standing in which he had to sit back down in recliner chair. pt still not realizing his balance deficits at this time.                            Cognition Arousal/Alertness: Awake/alert Behavior During Therapy: WFL for tasks assessed/performed Overall Cognitive Status: History of cognitive impairments - at baseline                                 General Comments: Pt with very poor awareness of deficits      Exercises      General Comments        Pertinent Vitals/Pain Pain Assessment: Faces Faces Pain Scale: No hurt    Home Living                      Prior Function            PT Goals (current goals can now be found in the care plan section) Acute Rehab PT Goals PT Goal  Formulation: With patient Time For Goal Achievement: 05/13/17 Potential to Achieve Goals: Fair Progress towards PT goals: Progressing toward goals    Frequency    Min 3X/week      PT Plan Current plan remains appropriate    Co-evaluation              AM-PAC PT "6 Clicks" Daily Activity  Outcome Measure  Difficulty turning over in bed (including adjusting bedclothes, sheets and blankets)?: Total Difficulty moving from lying on back to sitting on the side of the bed? : Total Difficulty sitting down on and standing up from a chair with arms (e.g., wheelchair, bedside commode, etc,.)?: Total Help needed moving to and from a bed to chair (including a wheelchair)?: A Lot Help needed walking in hospital room?: A Lot Help needed climbing 3-5 steps with a railing? : Total 6 Click Score: 8    End of  Session Equipment Utilized During Treatment: Gait belt Activity Tolerance: Patient tolerated treatment well Patient left: in chair;with call bell/phone within reach;with chair alarm set Nurse Communication: Mobility status PT Visit Diagnosis: Other abnormalities of gait and mobility (R26.89);Muscle weakness (generalized) (M62.81)     Time: 3159-4585 PT Time Calculation (min) (ACUTE ONLY): 18 min  Charges:  $Therapeutic Activity: 8-22 mins                    G Codes:       [place], [place], [place] 05/01/2017, 2:20 PM

## 2017-05-01 NOTE — Progress Notes (Signed)
Evan Charles to be D/C'd Home per MD order.  Discussed with the patient and all questions fully answered.  VSS, Skin clean, dry and intact without evidence of skin break down, no evidence of skin tears noted. IV catheter discontinued intact. Site without signs and symptoms of complications. Dressing and pressure applied.  An After Visit Summary was printed and given to the patient. Patient received prescription.  D/c education completed with patient/family including follow up instructions, medication list, d/c activities limitations if indicated, with other d/c instructions as indicated by MD - patient able to verbalize understanding, all questions fully answered.   Patient instructed to return to ED, call 911, or call MD for any changes in condition.   Patient escorted via Evan Charles, and D/C home via private auto.  Evan Charles 05/01/2017 4:13 PM

## 2017-05-01 NOTE — Progress Notes (Signed)
Occupational Therapy Treatment Patient Details Name: Evan Charles MRN: [id number] DOB: November 23, 1945 Today's Date: 05/01/2017    History of present illness 71 y/o M with PMHx of dementia with intermittent psychosis, intractable low back pain secondary to DDD of lumbar spine, impaired mobility and ADLs who presents from home for evaluation of BL thigh spasms, chronic back pain and difficulty ambulating secondary to the above.   OT comments  Pt continues to require maximum assistance to stand and moderate assistance to transfer to recliner. Pt with urinary incontinence with wet linen, but did not call for help. Pt demonstrating inability to get to EOB without moderate assistance. He presents with inability to locate visual targets on the R side. Long conversation about skills pt needs to have in order to discharge home safely. Pt continues to want to go home, stating he will do better once he's in his own environment. Continue to recommend SNF.  Follow Up Recommendations  SNF;Supervision/Assistance - 24 hour    Equipment Recommendations  None recommended by OT    Recommendations for Other Services      Precautions / Restrictions Precautions Precautions: Fall       Mobility Bed Mobility Overal bed mobility: Needs Assistance Bed Mobility: Supine to Sit     Supine to sit: Mod assist     General bed mobility comments: assist to raise trunk and advance hips to edge of chair with pad  Transfers Overall transfer level: Needs assistance Equipment used: Rolling walker (2 wheeled) Transfers: Sit to/from Stand Sit to Stand: Max assist Stand pivot transfers: Mod assist       General transfer comment: max assist to rise and translate weight over feet, cues for hand placement, assist to control descent to chair    Balance Overall balance assessment: Needs assistance   Sitting balance-Leahy Scale: Poor Sitting balance - Comments: posterior lean     Standing balance-Leahy Scale:  Poor Standing balance comment: strong posterior lean, cues to correct, but continued to require assist to translate weight over feet                           ADL either performed or assessed with clinical judgement   ADL Overall ADL's : Needs assistance/impaired Eating/Feeding: Independent;Sitting   Grooming: Wash/dry hands;Wash/dry face;Sitting;Minimal assistance Grooming Details (indicate cue type and reason): decreased thoroughness         Upper Body Dressing : Moderate assistance;Sitting Upper Body Dressing Details (indicate cue type and reason): changed soiled gown Lower Body Dressing: Maximal assistance;Sit to/from stand         Toileting - Clothing Manipulation Details (indicate cue type and reason): pt with urinary incontinence, did not call for assistance     Functional mobility during ADLs: Moderate assistance;Rolling walker;Cueing for safety;Cueing for sequencing General ADL Comments: Pt with heavy posterior lean. Denies fall risk, despite being allowed to experience his poor balance with the safety of the bed behind him.     Vision       Perception     Praxis      Cognition Arousal/Alertness: Awake/alert Behavior During Therapy: WFL for tasks assessed/performed Overall Cognitive Status: History of cognitive impairments - at baseline                                 General Comments: Pt with very poor awareness of deficits. Strongly does not want to go to  SNF despite being shown his fall risk and dependence in ADL.        Exercises     Shoulder Instructions       General Comments      Pertinent Vitals/ Pain       Pain Assessment: Faces Faces Pain Scale: No hurt  Home Living                                          Prior Functioning/Environment              Frequency  Min 2X/week        Progress Toward Goals  OT Goals(current goals can now be found in the care plan section)  Progress  towards OT goals: Not progressing toward goals - comment (continues to demonstrate impaired cognition, post lean)  Acute Rehab OT Goals Patient Stated Goal: return home OT Goal Formulation: With patient Time For Goal Achievement: 05/13/17 Potential to Achieve Goals: Good  Plan Discharge plan remains appropriate    Co-evaluation                 AM-PAC PT "6 Clicks" Daily Activity     Outcome Measure   Help from another person eating meals?: None Help from another person taking care of personal grooming?: A Little Help from another person toileting, which includes using toliet, bedpan, or urinal?: A Lot Help from another person bathing (including washing, rinsing, drying)?: A Lot Help from another person to put on and taking off regular upper body clothing?: A Lot Help from another person to put on and taking off regular lower body clothing?: Total 6 Click Score: 14    End of Session Equipment Utilized During Treatment: Gait belt;Rolling walker  OT Visit Diagnosis: Unsteadiness on feet (R26.81);Other abnormalities of gait and mobility (R26.89);Muscle weakness (generalized) (M62.81);Pain   Activity Tolerance Patient tolerated treatment well   Patient Left in chair;with call bell/phone within reach;with chair alarm set   Nurse Communication          Time: [phone] OT Time Calculation (min): 43 min  Charges: OT General Charges $OT Visit: 1 Procedure OT Treatments $Self Care/Home Management : 38-52 mins   Malka So 05/01/2017, 9:24 AM [phone]

## 2017-05-01 NOTE — Progress Notes (Signed)
   Subjective: Seen and evaluated today at bedside. Evan Charles denies any complaints other than still being hospitalized. He remains insistent on return home instead of SNF. He is agreeable to additional home health services. Believes his nephew will pick him up.   Objective:  Vital signs in last 24 hours: Vitals:   04/30/17 1026 04/30/17 1445 04/30/17 2105 05/01/17 0652  BP: 119/73 131/76 115/64 135/81  Pulse:  75 83 78  Resp:  18 18 18   Temp:  97.8 F (36.6 C) 98.5 F (36.9 C) 98.2 F (36.8 C)  TempSrc:  Oral  Oral  SpO2:  100% 95% 96%  Weight:    175 lb 3.2 oz (79.5 kg)  Height:       General: NAD. Resting comfortably in recliner. Feet elevated.  HEENT: Normocephalic, atraumatic. No scleral icterus. No ptosis. MMM. Lungs: Breathing comfortably on room air. Grossly clear.  Heart: Regular rate and rhythm, no murmur/rub/gallop Abdomen: Soft, nontender, nondistended. +BS Extremities: Warm. Trace edema. Ted hose in place.  Neurologic: Alert and interactive. No gross deficits. Responds to questions appropriately.   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 This is largely unchanged from prior. His strength remains intact though. -PT recs SNF/24 hour supervision/assistance.  -Tylenol prn - K pad  -Please limit use of narcotics in this patient with dementia -CSW on board, appreciate assistance. [facility] could be involved? Will heed HH PT, OT, RN and aid arranged as well.   #Chronic Mental Illness with Hx of Psychosis + hallucinations ##Dementia Mental status clear again this morning with rational through process/conversation. Seen by psychiatry who believe patient can currently make his own medical decisions. Patient is quite insistent on his return home. -Continue  Seroquel 50 mg QHS and Rivastigmine 1.5 mg BID  # Rhabdomyolysis: CK continued to downtrend and resulted yesterday afternoon at 880. Cr 0.9 on yesterdays afternoon labs. Will no longer trend  # BL LE Edema 2/2 dependent edema -Continue Ted hose and elevate LE's.  -Daily weights  # HTN BP remains stable. Continue Amlodipine 5mg  and HCTZ 12.5 mg daily  Dispo: Anticipated discharge today pending adequate HH services arranged and [facility] involvement.   Evan Harbach, DO 05/01/2017, 10:19 AM Pager: [phone]

## 2017-05-01 NOTE — Progress Notes (Signed)
CSW consulted with previous CSW working this case. As per previous note on 6/18, CSW Surveyor, quantity stated that patient is considered custodial care ([place] will not pay for rehab). CSW advised patient's nephew that patient should look into long term care placement, which would require Medicaid to be approved. Patient does not want to leave his house and therefore will not consider long term care placement. RNCM has set up [place] services for nephew to take patient home.   CSW signing off. Evan Charles LCSWA [phone]

## 2017-05-01 NOTE — Consult Note (Addendum)
   [facility] Inpatient Consult   05/01/2017  Evan Charles 1945/12/17 [id number]    Referral received for [place] Management program.  Cross checked with [place] Management Assistant Director to confirm that patient is eligible due even though his Primary Care MD is [place], [place] due to patient being on All Payers List.  Martin Majestic to bedside to speak with patient. He was apprehensive but signed [place] Management consent. Explained services and explained that [facility] will not interfere or replace services provided thru home health.  Discussed that SNF was recommended. Evan Charles stated " I want to go home". However, once his nephew entered the room, Evan Charles stated he would consider going to SNF. Patient's nephew, [name] states patient is unable to care for himself at home.  Inpatient staff made aware of all of the above. Please see notes from inpatient LCSW and inpatient RNCM regarding SNF situation.  In the meantime, will make referral for [facility] RNCM and [facility] LCSW since it appears patient is unable to go to SNF. Patient will have home health with the high risk initiative program.  [place] Management packet provided with contact information.   Marthenia Rolling, MSN-Ed, RN,BSN [facility] Liaison [phone]

## 2017-05-02 ENCOUNTER — Emergency Department (HOSPITAL_COMMUNITY): Payer: Medicare Other

## 2017-05-02 ENCOUNTER — Other Ambulatory Visit: Payer: Self-pay | Admitting: *Deleted

## 2017-05-02 ENCOUNTER — Inpatient Hospital Stay (HOSPITAL_COMMUNITY)
Admission: EM | Admit: 2017-05-02 | Discharge: 2017-05-06 | DRG: 565 | Disposition: A | Discharge status: Home Health | Payer: Medicare Other | Attending: Oncology | Admitting: Oncology

## 2017-05-02 ENCOUNTER — Encounter (HOSPITAL_COMMUNITY): Payer: Self-pay

## 2017-05-02 DIAGNOSIS — R627 Adult failure to thrive: Secondary | ICD-10-CM

## 2017-05-02 DIAGNOSIS — K219 Gastro-esophageal reflux disease without esophagitis: Secondary | ICD-10-CM | POA: Diagnosis present

## 2017-05-02 DIAGNOSIS — F0390 Unspecified dementia without behavioral disturbance: Secondary | ICD-10-CM | POA: Diagnosis present

## 2017-05-02 DIAGNOSIS — I10 Essential (primary) hypertension: Secondary | ICD-10-CM | POA: Diagnosis not present

## 2017-05-02 DIAGNOSIS — G8929 Other chronic pain: Secondary | ICD-10-CM

## 2017-05-02 DIAGNOSIS — W19XXXA Unspecified fall, initial encounter: Secondary | ICD-10-CM

## 2017-05-02 DIAGNOSIS — M25519 Pain in unspecified shoulder: Secondary | ICD-10-CM | POA: Diagnosis not present

## 2017-05-02 DIAGNOSIS — R600 Localized edema: Secondary | ICD-10-CM

## 2017-05-02 DIAGNOSIS — M545 Low back pain: Secondary | ICD-10-CM | POA: Diagnosis not present

## 2017-05-02 DIAGNOSIS — M4807 Spinal stenosis, lumbosacral region: Secondary | ICD-10-CM | POA: Diagnosis present

## 2017-05-02 DIAGNOSIS — Z79899 Other long term (current) drug therapy: Secondary | ICD-10-CM

## 2017-05-02 DIAGNOSIS — N390 Urinary tract infection, site not specified: Secondary | ICD-10-CM | POA: Diagnosis present

## 2017-05-02 DIAGNOSIS — M6282 Rhabdomyolysis: Secondary | ICD-10-CM

## 2017-05-02 DIAGNOSIS — R269 Unspecified abnormalities of gait and mobility: Secondary | ICD-10-CM | POA: Diagnosis present

## 2017-05-02 DIAGNOSIS — M48061 Spinal stenosis, lumbar region without neurogenic claudication: Secondary | ICD-10-CM

## 2017-05-02 DIAGNOSIS — Z87891 Personal history of nicotine dependence: Secondary | ICD-10-CM | POA: Diagnosis not present

## 2017-05-02 DIAGNOSIS — Z9181 History of falling: Secondary | ICD-10-CM

## 2017-05-02 DIAGNOSIS — Z602 Problems related to living alone: Secondary | ICD-10-CM

## 2017-05-02 DIAGNOSIS — Z8546 Personal history of malignant neoplasm of prostate: Secondary | ICD-10-CM | POA: Diagnosis not present

## 2017-05-02 DIAGNOSIS — F29 Unspecified psychosis not due to a substance or known physiological condition: Secondary | ICD-10-CM | POA: Diagnosis not present

## 2017-05-02 DIAGNOSIS — Y92009 Unspecified place in unspecified non-institutional (private) residence as the place of occurrence of the external cause: Secondary | ICD-10-CM

## 2017-05-02 DIAGNOSIS — T148XXA Other injury of unspecified body region, initial encounter: Secondary | ICD-10-CM | POA: Diagnosis not present

## 2017-05-02 DIAGNOSIS — Z7409 Other reduced mobility: Secondary | ICD-10-CM | POA: Diagnosis not present

## 2017-05-02 DIAGNOSIS — Z809 Family history of malignant neoplasm, unspecified: Secondary | ICD-10-CM

## 2017-05-02 DIAGNOSIS — B9689 Other specified bacterial agents as the cause of diseases classified elsewhere: Secondary | ICD-10-CM | POA: Diagnosis present

## 2017-05-02 DIAGNOSIS — E876 Hypokalemia: Secondary | ICD-10-CM | POA: Diagnosis present

## 2017-05-02 DIAGNOSIS — R296 Repeated falls: Secondary | ICD-10-CM | POA: Diagnosis not present

## 2017-05-02 DIAGNOSIS — M25512 Pain in left shoulder: Secondary | ICD-10-CM | POA: Diagnosis present

## 2017-05-02 DIAGNOSIS — T796XXA Traumatic ischemia of muscle, initial encounter: Secondary | ICD-10-CM | POA: Diagnosis not present

## 2017-05-02 DIAGNOSIS — W07XXXA Fall from chair, initial encounter: Secondary | ICD-10-CM | POA: Diagnosis present

## 2017-05-02 HISTORY — DX: Localized edema: R60.0

## 2017-05-02 HISTORY — DX: Urinary tract infection, site not specified: N39.0

## 2017-05-02 LAB — URINALYSIS, ROUTINE W REFLEX MICROSCOPIC
BILIRUBIN URINE: NEGATIVE
GLUCOSE, UA: NEGATIVE mg/dL
KETONES UR: NEGATIVE mg/dL
NITRITE: POSITIVE — AB
PH: 6 (ref 5.0–8.0)
PROTEIN: 100 mg/dL — AB
Specific Gravity, Urine: 1.017 (ref 1.005–1.030)

## 2017-05-02 LAB — COMPREHENSIVE METABOLIC PANEL
ALT: 23 U/L (ref 17–63)
ANION GAP: 7 (ref 5–15)
AST: 65 U/L — AB (ref 15–41)
Albumin: 3.7 g/dL (ref 3.5–5.0)
Alkaline Phosphatase: 51 U/L (ref 38–126)
BILIRUBIN TOTAL: 1.1 mg/dL (ref 0.3–1.2)
BUN: 11 mg/dL (ref 6–20)
CO2: 31 mmol/L (ref 22–32)
Calcium: 9.2 mg/dL (ref 8.9–10.3)
Chloride: 100 mmol/L — ABNORMAL LOW (ref 101–111)
Creatinine, Ser: 1.09 mg/dL (ref 0.61–1.24)
Glucose, Bld: 99 mg/dL (ref 65–99)
POTASSIUM: 4.1 mmol/L (ref 3.5–5.1)
Sodium: 138 mmol/L (ref 135–145)
Total Protein: 7 g/dL (ref 6.5–8.1)

## 2017-05-02 LAB — CBC WITH DIFFERENTIAL/PLATELET
BASOS ABS: 0 10*3/uL (ref 0.0–0.1)
Basophils Relative: 0 %
EOS ABS: 0.1 10*3/uL (ref 0.0–0.7)
EOS PCT: 2 %
HCT: 42.2 % (ref 39.0–52.0)
Hemoglobin: 13.4 g/dL (ref 13.0–17.0)
LYMPHS ABS: 1.7 10*3/uL (ref 0.7–4.0)
Lymphocytes Relative: 21 %
MCH: 27.1 pg (ref 26.0–34.0)
MCHC: 31.8 g/dL (ref 30.0–36.0)
MCV: 85.4 fL (ref 78.0–100.0)
Monocytes Absolute: 0.5 10*3/uL (ref 0.1–1.0)
Monocytes Relative: 6 %
NEUTROS PCT: 71 %
Neutro Abs: 5.7 10*3/uL (ref 1.7–7.7)
PLATELETS: 260 10*3/uL (ref 150–400)
RBC: 4.94 MIL/uL (ref 4.22–5.81)
RDW: 14.6 % (ref 11.5–15.5)
WBC: 8.1 10*3/uL (ref 4.0–10.5)

## 2017-05-02 LAB — CK: CK TOTAL: 3337 U/L — AB (ref 49–397)

## 2017-05-02 MED ORDER — SODIUM CHLORIDE 0.9% FLUSH
3.0000 mL | Freq: Two times a day (BID) | INTRAVENOUS | Status: DC
  Administered 2017-05-02 – 2017-05-06 (×3): 3 mL via INTRAVENOUS

## 2017-05-02 MED ORDER — SODIUM CHLORIDE 0.9% FLUSH: 3.0000 mL | Freq: Two times a day (BID) | INTRAVENOUS | Status: DC

## 2017-05-02 MED ORDER — SODIUM CHLORIDE 0.9% FLUSH: 3.0000 mL | INTRAVENOUS | Status: DC | PRN

## 2017-05-02 MED ORDER — PANTOPRAZOLE SODIUM 40 MG PO TBEC
40.0000 mg | DELAYED_RELEASE_TABLET | Freq: Two times a day (BID) | ORAL | Status: DC
  Administered 2017-05-02 – 2017-05-06 (×8): 40 mg via ORAL
  Filled 2017-05-02 (×8): qty 1

## 2017-05-02 MED ORDER — SODIUM CHLORIDE 0.9 % IV SOLN: 250.0000 mL | INTRAVENOUS | Status: DC | PRN

## 2017-05-02 MED ORDER — ENOXAPARIN SODIUM 40 MG/0.4ML ~~LOC~~ SOLN
40.0000 mg | Freq: Every day | SUBCUTANEOUS | Status: DC
  Administered 2017-05-02 – 2017-05-05 (×4): 40 mg via SUBCUTANEOUS
  Filled 2017-05-02 (×4): qty 0.4

## 2017-05-02 MED ORDER — RIVASTIGMINE TARTRATE 1.5 MG PO CAPS
1.5000 mg | ORAL_CAPSULE | Freq: Two times a day (BID) | ORAL | Status: DC
  Administered 2017-05-03 – 2017-05-06 (×8): 1.5 mg via ORAL
  Filled 2017-05-02 (×9): qty 1

## 2017-05-02 MED ORDER — DEXTROSE 5 % IV SOLN
1.0000 g | Freq: Once | INTRAVENOUS | Status: AC
  Administered 2017-05-02: 1 g via INTRAVENOUS
  Filled 2017-05-02: qty 10

## 2017-05-02 MED ORDER — QUETIAPINE FUMARATE 50 MG PO TABS
50.0000 mg | ORAL_TABLET | Freq: Every day | ORAL | Status: DC
  Administered 2017-05-02 – 2017-05-05 (×4): 50 mg via ORAL
  Filled 2017-05-02 (×4): qty 1

## 2017-05-02 MED ORDER — SODIUM CHLORIDE 0.9 % IV SOLN
INTRAVENOUS | Status: DC
  Administered 2017-05-02 – 2017-05-05 (×7): via INTRAVENOUS

## 2017-05-02 MED ORDER — ACETAMINOPHEN 500 MG PO TABS
1000.0000 mg | ORAL_TABLET | Freq: Four times a day (QID) | ORAL | Status: DC | PRN
  Administered 2017-05-02 – 2017-05-04 (×3): 1000 mg via ORAL
  Filled 2017-05-02 (×3): qty 2

## 2017-05-02 MED ORDER — POTASSIUM CHLORIDE CRYS ER 20 MEQ PO TBCR
40.0000 meq | EXTENDED_RELEASE_TABLET | Freq: Once | ORAL | Status: AC
  Administered 2017-05-02: 40 meq via ORAL
  Filled 2017-05-02: qty 2

## 2017-05-02 MED ORDER — SULFAMETHOXAZOLE-TRIMETHOPRIM 800-160 MG PO TABS
1.0000 | ORAL_TABLET | Freq: Two times a day (BID) | ORAL | Status: AC
  Administered 2017-05-03 – 2017-05-05 (×6): 1 via ORAL
  Filled 2017-05-02 (×7): qty 1

## 2017-05-02 MED ORDER — FUROSEMIDE 20 MG PO TABS
80.0000 mg | ORAL_TABLET | Freq: Every day | ORAL | Status: DC
  Administered 2017-05-03 – 2017-05-06 (×4): 80 mg via ORAL
  Filled 2017-05-02 (×4): qty 4

## 2017-05-02 MED ORDER — DOCUSATE SODIUM 100 MG PO CAPS: 100.0000 mg | ORAL_CAPSULE | Freq: Two times a day (BID) | ORAL | Status: DC | PRN

## 2017-05-02 MED ORDER — VITAMIN D 1000 UNITS PO TABS
1000.0000 [IU] | ORAL_TABLET | Freq: Every day | ORAL | Status: DC
  Administered 2017-05-03 – 2017-05-06 (×4): 1000 [IU] via ORAL
  Filled 2017-05-02 (×4): qty 1

## 2017-05-02 MED ORDER — AMLODIPINE BESYLATE 5 MG PO TABS
5.0000 mg | ORAL_TABLET | Freq: Every day | ORAL | Status: DC
  Administered 2017-05-03 – 2017-05-06 (×4): 5 mg via ORAL
  Filled 2017-05-02 (×4): qty 1

## 2017-05-02 MED ORDER — SODIUM CHLORIDE 0.9 % IV BOLUS (SEPSIS)
1000.0000 mL | Freq: Once | INTRAVENOUS | Status: AC
  Administered 2017-05-02: 1000 mL via INTRAVENOUS

## 2017-05-02 NOTE — ED Notes (Addendum)
Pt brought into ED wearing blue disposable scrub pants, disposable scrub shirt, and mesh underwear with a urine saturated chux stuffed inside. Pt cleaned with incontinence spray, placed in a gown and placed on new chux.

## 2017-05-02 NOTE — Discharge Summary (Signed)
Name: Evan Charles MRN: [id number] DOB: [birth year] 71 y.o. PCP: [facility]  Date of Admission: 04/28/2017 12:11 PM Date of Discharge: 05/01/2017 Attending Physician: Dr. Murriel Hopper, MD  Discharge Diagnosis: 1. Acute on chronic back pain 2. Advanced lumbar degenerative disease and spinal stenosis 3. Unspecified psychosis with hallucinations  Principal Problem:   Spinal stenosis of lumbar region with radiculopathy Active Problems:   GERD without esophagitis   Bilateral lower extremity edema   Failure to thrive in adult   Essential hypertension   Leg weakness  Discharge Medications: Allergies as of 05/01/2017   No Known Allergies     Medication List    STOP taking these medications   naproxen 500 MG tablet Commonly known as:  NAPROSYN   traMADol 50 MG tablet Commonly known as:  ULTRAM     TAKE these medications   amLODipine 5 MG tablet Commonly known as:  NORVASC Take 1 tablet (5 mg total) by mouth daily.   cholecalciferol 1000 units tablet Commonly known as:  VITAMIN D Take 1,000 Units by mouth daily.   docusate sodium 100 MG capsule Commonly known as:  COLACE Take 1 capsule (100 mg total) by mouth 2 (two) times daily as needed for moderate constipation.   furosemide 80 MG tablet Commonly known as:  LASIX Take 80 mg by mouth daily.   hydrochlorothiazide 12.5 MG capsule Commonly known as:  MICROZIDE Take 1 capsule (12.5 mg total) by mouth daily.   pantoprazole 40 MG tablet Commonly known as:  PROTONIX Take 1 tablet (40 mg total) by mouth 2 (two) times daily.   QUEtiapine 50 MG tablet Commonly known as:  SEROQUEL Take 50 mg by mouth at bedtime. What changed:  Another medication with the same name was removed. Continue taking this medication, and follow the directions you see here.   rivastigmine 1.5 MG capsule Commonly known as:  EXELON Take 1.5 mg by mouth 2 (two) times daily.   urea 20 % cream Commonly known as:   CARMOL Apply 1 application topically daily.       Disposition and follow-up:   Mr.Aum E Heber was discharged from [facility] in Stable condition.  At the hospital follow up visit please address:  1. Living conditions: Please ensure patient is receiving all ordered [facility] services and that his living situation has improved with these additions.   Mental status, psychosis: Ensure compliance with seroquel   2.  Labs / imaging needed at time of follow-up: none  3.  Pending labs/ test needing follow-up: none  Follow-up Appointments: Follow-up Information    [facility], [facility] Follow up.   Specialty:  Home Health Services Why:  Home health services arranged, office will call and setup home visit Contact information: [address] [place] [address] [phone]        Clinic, [facility] Follow up.   Contact information: [place] [address] [phone]          Hospital Course by problem list: Principal Problem:   Spinal stenosis of lumbar region with radiculopathy Active Problems:   GERD without esophagitis   Bilateral lower extremity edema   Failure to thrive in adult   Essential hypertension   Leg weakness   1. Acute on chronic back pain 2. Advanced lumbar degenerative disease and spinal stenosis 71 year old male admitted 04/28/17 for evaluation of acute on chronic back pain with muscle spasms of bilateral thighs due to known advanced degenerative  arthritis as well as spinal stenosis. On arrival to the home EMS reported the patients home was an shambles with feces, urine, broken glass broke and furniture strewn throughout. In addition the patient was also unkept, covered in feces, urine and food. Repeat MRI with stable advanced severe changes of his lumbar spine. Physical therapy and occupational therapy recommended SNF placement however patient adamantly refused. Seen by psychiatry who  felt he was capable of making his own medical decisions. He was discharged home with multiple home health services including physical therapy, occupational therapy, aide and RN. He was also referred to [facility] and and will have a healthcare employee at his home daily for several hours per CM.  3.Unspecified psychosis with hallucinations 4. Failure to thrive Patient has history of chronic unspecified psychosis on Seroquel. He endorsed some hallucinations on presentation. He was most likely noncompliant with his medication prior to admission as his mental status improved rapidly with appropriate medications. Patient admitted the condition of his house secondary to "laziness" and states he will try harder. He was seen by psychiatry who felt he was capable of making his own medical decisions did not find him a harm to himself or others.  Discharge Vitals:   BP 108/71 (BP Location: Left Arm)   Pulse 93   Temp 97.8 F (36.6 C)   Resp 18   Ht 5\' 9"  (1.753 m)   Wt 175 lb 3.2 oz (79.5 kg)   SpO2 98%   BMI 25.87 kg/m   Pertinent Labs, Studies, and Procedures:  Chest x-ray without acute cardiopulmonary process. CT head without contrast: No acute intracranial abnormality. Brain atrophy MRI lumbar spine: Advanced multilevel degenerative disc disease with diffuse moderate to severe canal stenosis.  Discharge Instructions: Discharge Instructions    AMB Referral to [place] Management    Complete by:  As directed    Please assign to [place] transition of care and [facility] LCSW for community resources and encouragement for applying for Medicaid or accessing VA benefits. Will have home health thru [facility] program. Please contact hospital liaison once referral received. Written consent obtained. To discharge home today 05/01/17. Please see hospital liaison notes and inpatient LCSW notes as well. Thanks.Marthenia Rolling, [place], [facility] [phone]   Reason for consult:  Please  assign to [place]   Expected date of contact:  1-3 days (reserved for hospital discharges)     Signed: Einar Gip, DO 05/02/2017, 10:08 AM   Pager: [phone]

## 2017-05-02 NOTE — Patient Outreach (Addendum)
[facility]) Care Management  05/02/2017  Evan Charles 03/07/1946 [id number]   Covering for Valente David, RN for transition of care  RN attempted to contact pt today however [employer] number not working. Will update case manager to continue outreach attempts for [facility] services.  Raina Mina, RN Care Management Coordinator [place] Office [phone]

## 2017-05-02 NOTE — Patient Outreach (Signed)
[facility]) Care Management  05/02/2017  MACARTHUR LORUSSO 21-Sep-1946 [id number]   Covering for Valente David, RN  RN attempted once again an outreach call to pt however unsuccessful. Will report to Valente David, RN to continue outreach calls accordingly.  Raina Mina, RN Care Management Coordinator [place] Office [phone]

## 2017-05-02 NOTE — ED Notes (Signed)
Pt left hip noted as redc and swollen when cleaned of old urine. Same reported to Dr. Doree Fudge.

## 2017-05-02 NOTE — ED Provider Notes (Signed)
[facility] DEPT Provider Note   CSN: [id number] Arrival date & time: 05/02/17  1627     History   Chief Complaint Chief Complaint  Patient presents with  . Failure To Thrive  . Hallucinations    HPI Evan Charles is a 71 y.o. male.  The history is provided by the patient and medical records. No language interpreter was used.  Weakness  Primary symptoms include no focal weakness. This is a recurrent problem. The current episode started 12 to 24 hours ago. The problem has not changed since onset.There was no focality noted. There has been no fever. Pertinent negatives include no shortness of breath, no chest pain and no vomiting.    Past Medical History:  Diagnosis Date  . Back pain   . Chronic mental illness   . Colitis   . Dementia   . GERD (gastroesophageal reflux disease)   . Hypertension   . Neuropathy    " MY HANDS "  . Prostate cancer [facility])     Patient Active Problem List   Diagnosis Date Noted  . UTI (urinary tract infection) 05/02/2017  . Leg weakness 04/28/2017  . Spinal stenosis of lumbar region with radiculopathy 04/28/2017  . Impaired mobility and ADLs   . Hypokalemia 11/05/2016  . Dementia with psychosis 09/12/2016  . Acute delirium 09/08/2016  . Rhabdomyolysis 09/08/2016  . Normocytic anemia 09/08/2016  . Altered mental status 09/08/2016  . Fall at home   . UTI (lower urinary tract infection) 07/25/2016  . Mild cognitive impairment 07/25/2016  . 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 Immobility 03/20/2016  . Generalized weakness 03/20/2016  . Failure to thrive in adult 03/20/2016  . Neuropathy   . Essential hypertension   . Physical deconditioning   . 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 GERD without esophagitis 12/19/2015  . Bilateral lower extremity edema 12/19/2015  . Intractable low back pain 02/14/2015  . Difficulty walking 02/14/2015  . H/O: upper GI bleed   . Bilateral back pain   . Visual  hallucinations   . Psychoses     Past Surgical History:  Procedure Laterality Date  . KNEE ARTHROSCOPY    . LAMINECTOMY    . PROSTATECTOMY         Home Medications    Prior to Admission medications   Medication Sig Start Date End Date Taking? Authorizing Provider  amLODipine (NORVASC) 5 MG tablet Take 1 tablet (5 mg total) by mouth daily. 04/30/17  Yes Zada Finders, MD  cholecalciferol (VITAMIN D) 1000 units tablet Take 1,000 Units by mouth daily.   Yes [provider]  furosemide (LASIX) 80 MG tablet Take 80 mg by mouth daily.   Yes [provider]  hydrochlorothiazide (MICROZIDE) 12.5 MG capsule Take 1 capsule (12.5 mg total) by mouth daily. 04/30/17  Yes Zada Finders, MD  QUEtiapine (SEROQUEL) 50 MG tablet Take 50 mg by mouth at bedtime. 04/05/17  Yes [provider]  rivastigmine (EXELON) 1.5 MG capsule Take 1.5 mg by mouth 2 (two) times daily. 04/05/17  Yes [provider]  docusate sodium (COLACE) 100 MG capsule Take 1 capsule (100 mg total) by mouth 2 (two) times daily as needed for moderate constipation. 04/30/17   Zada Finders, MD  pantoprazole (PROTONIX) 40 MG tablet Take 1 tablet (40 mg total) by mouth 2 (two) times daily. 12/16/15   Leo Grosser, MD  urea (CARMOL) 20 % cream Apply 1 application topically daily.    [provider]    Family History Family History  Problem Relation Age of Onset  . Cancer Brother   . Cancer Maternal Grandmother     Social History Social History  Substance Use Topics  . Smoking status: Former Smoker    Types: Cigarettes  . Smokeless tobacco: Never Used  . Alcohol use No     Allergies   Patient has no known allergies.   Review of Systems Review of Systems  Constitutional: Negative for chills and fever.  HENT: Negative for ear pain and sore throat.   Eyes: Negative for pain and visual disturbance.  Respiratory: Negative for cough and shortness of breath.   Cardiovascular: Negative  for chest pain and palpitations.  Gastrointestinal: Negative for abdominal pain and vomiting.  Genitourinary: Negative for dysuria and hematuria.  Musculoskeletal: Negative for arthralgias and back pain.       Positive for L shoulder and L hip pain  Skin: Negative for color change and rash.  Neurological: Positive for weakness. Negative for focal weakness, seizures and syncope.  All other systems reviewed and are negative.    Physical Exam Updated Vital Signs BP 113/80 (BP Location: Right Arm)   Pulse 76   Temp 97.5 F (36.4 C) (Oral)   Resp 16   Ht 5\' 10"  (1.778 m)   Wt 81.5 kg (179 lb 11.2 oz)   SpO2 97%   BMI 25.78 kg/m   Physical Exam  Constitutional: He appears well-developed. He appears ill.  Smell of urine  HENT:  Head: Normocephalic and atraumatic.  Eyes: Conjunctivae are normal.  Neck: Neck supple.  Cardiovascular: Normal rate and regular rhythm.   No murmur heard. Pulmonary/Chest: Effort normal and breath sounds normal. No respiratory distress.  Abdominal: Soft. There is no tenderness.  Musculoskeletal: He exhibits tenderness (L shoulder and L hip TTP). He exhibits no edema.  Neurological: He is alert. No cranial nerve deficit. Coordination normal.  Moves all extremities. Motor strength RLE>LLE  Skin: Skin is warm and dry.  Nursing note and vitals reviewed.    ED Treatments / Results  Labs (all labs ordered are listed, but only abnormal results are displayed) Labs Reviewed  URINALYSIS, ROUTINE W REFLEX MICROSCOPIC - Abnormal; Notable for the following:       Result Value   APPearance CLOUDY (*)    Hgb urine dipstick MODERATE (*)    Protein, ur 100 (*)    Nitrite POSITIVE (*)    Leukocytes, UA LARGE (*)    Bacteria, UA MANY (*)    Squamous Epithelial / LPF 0-5 (*)    All other components within normal limits  COMPREHENSIVE METABOLIC PANEL - Abnormal; Notable for the following:    Chloride 100 (*)    AST 65 (*)    All other components within normal  limits  CK - Abnormal; Notable for the following:    Total CK 3,337 (*)    All other components within normal limits  URINE CULTURE  CULTURE, BLOOD (ROUTINE X 2)  CULTURE, BLOOD (ROUTINE X 2)  CBC WITH DIFFERENTIAL/PLATELET  COMPREHENSIVE METABOLIC PANEL  MAGNESIUM  CK    EKG  EKG Interpretation  Date/Time:  Thursday May 02 2017 18:31:28 EDT Ventricular Rate:  82 PR Interval:    QRS Duration: 159 QT Interval:  423 QTC Calculation: 495 R Axis:   104 Text Interpretation:  Sinus rhythm RBBB and LPFB No significant change since last tracing Confirmed by Wandra Arthurs [phone]) on 05/02/2017 6:50:11 PM  Radiology Dg Chest 2 View  Result Date: 05/02/2017 CLINICAL DATA:  Fall EXAM: CHEST  2 VIEW COMPARISON:  None. FINDINGS: The heart size and mediastinal contours are within normal limits. Both lungs are clear. The visualized skeletal structures are unremarkable. IMPRESSION: No active cardiopulmonary disease. Electronically Signed   By: Ulyses Jarred M.D.   On: 05/02/2017 20:18   Dg Pelvis 1-2 Views  Result Date: 05/02/2017 CLINICAL DATA:  Status post fall, with left hip pain. Initial encounter. EXAM: PELVIS - 1-2 VIEW COMPARISON:  Pelvis radiograph performed 03/20/2016 FINDINGS: There is no evidence of fracture or dislocation. Both femoral heads are seated normally within their respective acetabula. Mild degenerative change is noted at the lower lumbar spine. The sacroiliac joints are unremarkable in appearance. The visualized bowel gas pattern is grossly unremarkable in appearance. IMPRESSION: No evidence of fracture or dislocation. Electronically Signed   By: Garald Balding M.D.   On: 05/02/2017 20:21   Mr Brain Wo Contrast  Result Date: 05/02/2017 CLINICAL DATA:  LEFT arm weakness after fall this morning. Visual hallucinations. Chest history of dementia, chronic mental illness and hypertension. EXAM: MRI HEAD WITHOUT CONTRAST MRI CERVICAL SPINE WITHOUT CONTRAST TECHNIQUE:  Multiplanar, multiecho pulse sequences of the brain and surrounding structures, and cervical spine, to include the craniocervical junction and cervicothoracic junction, were obtained without intravenous contrast. COMPARISON:  CT HEAD April 28, 2017 and CT cervical spine September 07, 2016 FINDINGS: MRI HEAD FINDINGS- Mild motion degraded examination. BRAIN: No reduced diffusion to suggest acute ischemia. No susceptibility artifact to suggest hemorrhage. The ventricles and sulci are normal for patient's age. Patchy supratentorial white matter FLAIR T2 hyperintensities compatible with mild chronic small vessel ischemic disease. No suspicious parenchymal signal, masses or mass effect. No abnormal extra-axial fluid collections. T1 shortening along the interhemispheric fissure corresponding to dural calcifications on prior CT. VASCULAR: Normal major intracranial vascular flow voids present at skull base. SKULL AND UPPER CERVICAL SPINE: No abnormal sellar expansion. No suspicious calvarial bone marrow signal. Craniocervical junction maintained. SINUSES/ORBITS: The mastoid air-cells and included paranasal sinuses are well-aerated. The included ocular globes and orbital contents are non-suspicious. OTHER: None. MRI CERVICAL SPINE FINDINGS- motion degraded examination. ALIGNMENT: Straightened cervical lordosis.  No malalignment. VERTEBRAE/DISCS: Vertebral C5-6 arthrodesis. Vertebral bodies intact. Severe C6-7 disc height loss of proportional chronic discogenic endplate changes, mild at C4-5. Mild bright STIR signal RIGHT C3-4 facets associated with arthropathy. Congenital canal narrowing on the basis of foreshortened pedicles. CORD:8 mm segment holo cord T2 bright spinal cord signal at C6 (sagittal stir 05/2014). No syrinx. POSTERIOR FOSSA, VERTEBRAL ARTERIES, PARASPINAL TISSUES: No MR findings of ligamentous injury. Vertebral artery flow voids present. Included posterior fossa and paraspinal soft tissues are normal. DISC  LEVELS (limited moderately motion degraded axial sequences. C2-3: Uncovertebral hypertrophy. Severe RIGHT and moderate LEFT facet arthropathy. Mild canal stenosis. Severe RIGHT and at least moderate LEFT neural foraminal narrowing. C3-4: Small broad-based disc bulge, uncovertebral hypertrophy. Moderate RIGHT, severe LEFT facet arthropathy. Moderate to severe canal stenosis, AP dimension the canal is 7 mm. Moderate to severe RIGHT and severe LEFT probable neural foraminal narrowing. C4-5: Uncovertebral hypertrophy, moderate facet arthropathy. Mild canal stenosis. Severe bilateral neural foraminal narrowing. C5-6: Arthrodesis. No canal stenosis. Moderate to severe bilateral neural foraminal narrowing. C6-7: Uncovertebral hypertrophy and mild facet arthropathy. Severe canal stenosis, AP dimension of the canal 6 mm. Severe bilateral neural foraminal narrowing. C7-T1: Mild RIGHT, severe LEFT facet arthropathy. Annular bulging. No canal stenosis. Mild LEFT neural foraminal narrowing. IMPRESSION: MRI HEAD: Mildly  motion degraded examination. Negative noncontrast MRI of the head for age. MRI CERVICAL SPINE: Moderately motion degraded examination. C5-6 arthrodesis. 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 No syrinx. Advanced facet arthropathy with superimposed acute reactive changes RIGHT C3-4 facet. Moderate to severe canal stenosis C3-4, mild at C2-3 and C4-5. Neural foraminal narrowing at all cervical levels, severe at multiple levels. Electronically Signed   By: Elon Alas M.D.   On: 05/02/2017 21:44   Mr Cervical Spine Wo Contrast  Result Date: 05/02/2017 CLINICAL DATA:  LEFT arm weakness after fall this morning. Visual hallucinations. Chest history of dementia, chronic mental illness and hypertension. EXAM: MRI HEAD WITHOUT CONTRAST MRI CERVICAL SPINE WITHOUT CONTRAST TECHNIQUE: Multiplanar, multiecho pulse sequences of the brain  and surrounding structures, and cervical spine, to include the craniocervical junction and cervicothoracic junction, were obtained without intravenous contrast. COMPARISON:  CT HEAD April 28, 2017 and CT cervical spine September 07, 2016 FINDINGS: MRI HEAD FINDINGS- Mild motion degraded examination. BRAIN: No reduced diffusion to suggest acute ischemia. No susceptibility artifact to suggest hemorrhage. The ventricles and sulci are normal for patient's age. Patchy supratentorial white matter FLAIR T2 hyperintensities compatible with mild chronic small vessel ischemic disease. No suspicious parenchymal signal, masses or mass effect. No abnormal extra-axial fluid collections. T1 shortening along the interhemispheric fissure corresponding to dural calcifications on prior CT. VASCULAR: Normal major intracranial vascular flow voids present at skull base. SKULL AND UPPER CERVICAL SPINE: No abnormal sellar expansion. No suspicious calvarial bone marrow signal. Craniocervical junction maintained. SINUSES/ORBITS: The mastoid air-cells and included paranasal sinuses are well-aerated. The included ocular globes and orbital contents are non-suspicious. OTHER: None. MRI CERVICAL SPINE FINDINGS- motion degraded examination. ALIGNMENT: Straightened cervical lordosis.  No malalignment. VERTEBRAE/DISCS: Vertebral C5-6 arthrodesis. Vertebral bodies intact. Severe C6-7 disc height loss of proportional chronic discogenic endplate changes, mild at C4-5. Mild bright STIR signal RIGHT C3-4 facets associated with arthropathy. Congenital canal narrowing on the basis of foreshortened pedicles. CORD:8 mm segment holo cord T2 bright spinal cord signal at C6 (sagittal stir 05/2014). No syrinx. POSTERIOR FOSSA, VERTEBRAL ARTERIES, PARASPINAL TISSUES: No MR findings of ligamentous injury. Vertebral artery flow voids present. Included posterior fossa and paraspinal soft tissues are normal. DISC LEVELS (limited moderately motion degraded axial  sequences. C2-3: Uncovertebral hypertrophy. Severe RIGHT and moderate LEFT facet arthropathy. Mild canal stenosis. Severe RIGHT and at least moderate LEFT neural foraminal narrowing. C3-4: Small broad-based disc bulge, uncovertebral hypertrophy. Moderate RIGHT, severe LEFT facet arthropathy. Moderate to severe canal stenosis, AP dimension the canal is 7 mm. Moderate to severe RIGHT and severe LEFT probable neural foraminal narrowing. C4-5: Uncovertebral hypertrophy, moderate facet arthropathy. Mild canal stenosis. Severe bilateral neural foraminal narrowing. C5-6: Arthrodesis. No canal stenosis. Moderate to severe bilateral neural foraminal narrowing. C6-7: Uncovertebral hypertrophy and mild facet arthropathy. Severe canal stenosis, AP dimension of the canal 6 mm. Severe bilateral neural foraminal narrowing. C7-T1: Mild RIGHT, severe LEFT facet arthropathy. Annular bulging. No canal stenosis. Mild LEFT neural foraminal narrowing. IMPRESSION: MRI HEAD: Mildly motion degraded examination. Negative noncontrast MRI of the head for age. MRI CERVICAL SPINE: Moderately motion degraded examination. C5-6 arthrodesis. 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 No syrinx. Advanced facet arthropathy with superimposed acute reactive changes RIGHT C3-4 facet. Moderate to severe canal stenosis C3-4, mild at C2-3 and C4-5. Neural foraminal narrowing at all cervical levels, severe at multiple levels.  Electronically Signed   By: Elon Alas M.D.   On: 05/02/2017 21:44   Dg Shoulder Left  Result Date: 05/02/2017 CLINICAL DATA:  Fall from chair, hit ground. EXAM: LEFT SHOULDER - 2+ VIEW COMPARISON:  None. FINDINGS: The humeral head is well-formed and located. The subacromial, glenohumeral and acromioclavicular joint spaces are intact. No destructive bony lesions. Punctate calcification at humeral head associated with calcific tendinopathy. Soft  tissue planes are non-suspicious. IMPRESSION: No acute osseous process. Electronically Signed   By: Elon Alas M.D.   On: 05/02/2017 20:18   Dg Femur Min 2 Views Left  Result Date: 05/02/2017 CLINICAL DATA:  Status post fall, with left leg pain. Initial encounter. EXAM: LEFT FEMUR 2 VIEWS COMPARISON:  None. FINDINGS: There is no evidence of fracture or dislocation. The left femur appears intact. The left femoral head remains seated at the acetabulum. The knee joint is unremarkable. No knee joint effusion is identified. Scattered vascular calcifications are seen. Mild soft tissue swelling is noted overlying the left hip. IMPRESSION: 1. No evidence of fracture or dislocation. 2. Scattered vascular calcifications seen. Electronically Signed   By: Garald Balding M.D.   On: 05/02/2017 20:21    Procedures Procedures (including critical care time)  Medications Ordered in ED Medications  amLODipine (NORVASC) tablet 5 mg (not administered)  cholecalciferol (VITAMIN D) tablet 1,000 Units (not administered)  docusate sodium (COLACE) capsule 100 mg (not administered)  furosemide (LASIX) tablet 80 mg (not administered)  QUEtiapine (SEROQUEL) tablet 50 mg (50 mg Oral Given 05/02/17 2254)  rivastigmine (EXELON) capsule 1.5 mg (1.5 mg Oral Given 05/03/17 0037)  pantoprazole (PROTONIX) EC tablet 40 mg (40 mg Oral Given 05/02/17 2254)  enoxaparin (LOVENOX) injection 40 mg (40 mg Subcutaneous Given 05/02/17 2254)  sodium chloride flush (NS) 0.9 % injection 3 mL (3 mLs Intravenous Not Given 05/02/17 2258)  0.9 %  sodium chloride infusion ( Intravenous New Bag/Given 05/02/17 2254)  sodium chloride flush (NS) 0.9 % injection 3 mL (3 mLs Intravenous Given 05/02/17 2254)  sodium chloride flush (NS) 0.9 % injection 3 mL (not administered)  0.9 %  sodium chloride infusion (not administered)  sulfamethoxazole-trimethoprim (BACTRIM DS,SEPTRA DS) 800-160 MG per tablet 1 tablet (not administered)  acetaminophen  (TYLENOL) tablet 1,000 mg (1,000 mg Oral Given 05/02/17 2254)  sodium chloride 0.9 % bolus 1,000 mL (0 mLs Intravenous Stopped 05/02/17 1923)  cefTRIAXone (ROCEPHIN) 1 g in dextrose 5 % 50 mL IVPB (0 g Intravenous Stopped 05/02/17 2254)  potassium chloride SA (K-DUR,KLOR-CON) CR tablet 40 mEq (40 mEq Oral Given 05/02/17 2254)     Initial Impression / Assessment and Plan / ED Course  I have reviewed the triage vital signs and the nursing notes.  Pertinent labs & imaging results that were available during my care of the patient were reviewed by me and considered in my medical decision making (see chart for details).     71 year old male history of mild dementia, HTN, BLE edema, poor living conditions and recent admission for acute on chronic back pain who presents for evaluation after concerns raised by [facility] nursing and APS.  Patient was visited today by [facility] nurse and found covered in urine (similarly to previous ED evaluations). Pt also reports falling today and hitting L shoulder (pain also present on previous ED evaluations). Pt describes lives at home with his nephew. Pt denies any abuse concerns. Pt states he does not call his nephew for assistance due to not wanting to be a burden. During recent  admission, pt was deemed to have capacity for decision making. Pt had declined SNF or rehab placement per case management not. Pt demonstrates similar capacity today. No other acute sypmtoms today.  AF, VSS. TTP over L shoulder and L hip. Lungs CTAB. Abdomen soft, benign throughout.  Labwork significant for CK 3300 and UA with evidence of UTI with positive nitrite, large leukocytes, TNTC Urine WBCs, and many bacteria. Rocephin given.  Pt admitted with UTI and rhabdomyolysis. Pt stable at time of transfer.  Pt care d/w Dr. Darl Householder  Final Clinical Impressions(s) / ED Diagnoses   Final diagnoses:  Non-traumatic rhabdomyolysis  Urinary tract infection without hematuria, site unspecified    New  Prescriptions Current Discharge Medication List       Payton Emerald, MD 05/03/17 0354    Drenda Freeze, MD 05/03/17 [phone]

## 2017-05-02 NOTE — ED Notes (Signed)
Attempted report x 1, no answer 

## 2017-05-02 NOTE — H&P (Signed)
Date: 05/02/2017               Patient Name:  Evan Charles MRN: [id number]  DOB: 19-Aug-1946 Age / Sex: 71 y.o., male   PCP: [facility]         Medical Service: Internal Medicine Teaching Service         Attending Physician: Dr. Beryle Beams, Alyson Locket, MD    First Contact: Dr. Danford Bad Pager: [phone]  Second Contact: Dr. Posey Pronto Pager: [phone]       After Hours (After 5p/  First Contact Pager: [phone]  weekends / holidays): Second Contact Pager: [phone]   Chief Complaint: Fall  History of Present Illness:  Mr. Hinze is a 71 year old gentleman with a past medical history significant for intermittent psychosis, intractable low back pain secondary to spinal stenosis, and poor social situation presents to [facility] ED today for concerns raised by home health nursing and APS. [place] nursing and APS report his living conditions are horrendous and question his ably to make decision for himself. He was found to be covered in his own urine. He was recently hospitalized from 6/17-6/20 for acute on chronic back pain. At that time when EMS arrived they found him in horrendous living conditions, covered in urine and feces. MRI at that time showed advanced degenerative arthritis and spinal stenosis but no significant change compared with a May 2017 study. He had no focal deficits and his pain improved to his baseline. It was strongly recommended at that hospitalization that he agree to placement at an assisted living facility due to concerns over his ability to care for himself. He adamantly refused placement. He was evaluated by psychiatry and felt to be capable of making his own medical decisions.   Today, he was evaluated by [place] [facility] and APS and again found to be covered in urine. They requested further evaluated in the ED for his capacity. He denied any complaints to the EDP. He did report that he fell today and hit his left shoulder (has had complaints of L shoulder pain at previous ED visits).  Reports that he lives at home alone and his nephew checks on him occasionally. He denies any abuse. Does say he does not call his nephew for assistance as he does not want to be a burden.   He reports that he went to sit down on a chair and fell on his left side. Has complaints of left shoulder and left hip. Reports he could not get up after falling and was on the ground for 1-2 hours before the [facility] nurse found him and helped him up. He denies any dysuria or urgency but does report some urinary frequency however associates this with his furosemide. Denies any fever or chills. Does endorse some nausea without emesis, cough with yellow sputum production. Denies any chest pain, shortness of breath. Has complaints of bilateral leg pain (chronic) and leg swelling.   During evaluation by the ED today, he was found to have a urinary tract infection with positive nitrite, large leukocytes, numerous bacteria, WBC and RBCs. CK was mildly elevated at 3,300. CBC unremarkable and CMET only remarkable for mildly elevated AST at 65. CXR with no acute processes. XR of left shoulder, pelvis, and left femur with no acute fractures or dislocations.    Meds:  Current Meds  Medication Sig  . furosemide (LASIX) 80 MG tablet Take 80 mg by mouth daily.     Allergies: Allergies as of 05/02/2017  . (  No Known Allergies)   Past Medical History:  Diagnosis Date  . Back pain   . Chronic mental illness   . Colitis   . Dementia   . GERD (gastroesophageal reflux disease)   . Hypertension   . Neuropathy    " MY HANDS "  . Prostate cancer [facility])     Family History:  Family History  Problem Relation Age of Onset  . Cancer Brother   . Cancer Maternal Grandmother    Social History: Social History   Social History  . Marital status: Single    Spouse name: N/A  . Number of children: N/A  . Years of education: N/A   Occupational History  . Retired    Social History Main Topics  . Smoking status: Former Smoker     Types: Cigarettes  . Smokeless tobacco: Never Used  . Alcohol use No  . Drug use: No  . Sexual activity: No   Other Topics Concern  . None   Social History Narrative   Cares for self at home. Has friends to help and uses meals on wheels.    Review of Systems: A complete ROS was negative except as per HPI.   Physical Exam: Blood pressure 133/82, pulse 79, temperature 98.3 F (36.8 C), resp. rate 16, height 5\' 10"  (1.778 m), weight 195 lb (88.5 kg), SpO2 98 %. Physical Exam  Constitutional: He is oriented to person, place, and time and well-developed, well-nourished, and in no distress.  HENT:  Head: Normocephalic and atraumatic.  Eyes: Conjunctivae and EOM are normal. Pupils are equal, round, and reactive to light.  Neck: No JVD present.  Cardiovascular: Normal rate, regular rhythm, normal heart sounds and intact distal pulses.   Pulmonary/Chest: Effort normal and breath sounds normal. He has no wheezes. He has no rales.  Abdominal: Soft. Bowel sounds are normal. He exhibits no distension. There is no tenderness.  Musculoskeletal: He exhibits edema.  Trace pedal edema  Neurological: He is alert and oriented to person, place, and time.  Skin: Skin is warm and dry.  Psychiatric: Mood and affect normal.  Vitals reviewed.  EKG: NSR, RBBB  CXR: No active cardiopulmonary disease  Assessment & Plan by Problem:  Urinary Tract Infection:  U/A with positive nitrite, large leukocytes, numerous WBC and many bacteria. No leukocytosis, afebrile, no tachycardia or tachypnea, normotensive. He denies any dysuria or urgency but does have some frequency. Urine cultures and blood cultures were drawn in the ED and he was given a dose of ceftriaxone. Given his stable vitals, lack of symptoms and normal labs, will narrow to bactrim.  -Bactrim 800-60 mg bid starting tomorrow -Follow cultures  Rhabdomyolysis: CK 3300 on arrival. Renal function stable. Likely secondary to his fall with  prolonged down time. Received 1L bolus in the ED -NS 100 cc/hr -CMET in am -CK in am  Failure to thrive with multiple frequent falls and poor living situation: Patient was discharged approximately 24 hours prior. Reports another fall (similarly at previous admission) with prolonged down time as he was unable to stand on his own. He was found covered in urine again by the home health nurse and APS. He claims his nephew checks on him daily and helps take care of him. He denies any abuse. He was evaluated by psych at last admission and deemed to have capacity to make his own decisions. He refused SNF or ALF placement during last hospitalization. He reports tonight that he has another 3 months to pay off  his home and has no desire to go to any living facility.  -SW consult -PT -Fall precautions  Dementia with Hallucinations He reports visual hallucinations at home. He says that they only occur when he is alone at home. He reports insight that they are not real. Describes shadowy black figures moving along the walls. Denies any auditory hallucinations. He is alert and oriented and answering questions appropriately.  -Continue home Seroquel and Rivastigmine  B/L LE Edema: Appears euvolemic on exam with only trace pedal edema. Weight is up 4lbs since discharge.  -Continue home lasix -Daily weights  HTN: Normotensive in the ED. -Contineu home amlodipine -Holding home HCTZ given he is already on a loop diuretic   Diet: regular IVF: NS 100 cc/hr Code status: DNI - does want chest compressions etc. DVT ppx: lovenox  Dispo: Admit patient to Inpatient with expected length of stay greater than 2 midnights.  Signed: Maryellen Pile, MD 05/02/2017, 8:49 PM  Pager: [phone]

## 2017-05-02 NOTE — ED Triage Notes (Addendum)
Pt arrives EMS from home. Pt was sent by APS for evaluation of capability to make appropriate decision and requested eval in ED. Pt was DC from [facility] yesterday and arrives wearing blue paper scrub in has been incontinent of urine. C/o pain at left shoulder after fall this am. Was able to walk to EMS stretcher with assist x 2. Normally walks with walker. C/o continued low back pain which is chronic. APS was called by [facility] which came for first post discharge visit and found state of patient and home. Pt states he saw a bear that turned into a person in his home this mornign. Pt state he knew this was a hallucination.

## 2017-05-03 ENCOUNTER — Other Ambulatory Visit: Payer: Self-pay | Admitting: *Deleted

## 2017-05-03 ENCOUNTER — Encounter (HOSPITAL_COMMUNITY): Payer: Self-pay | Admitting: General Practice

## 2017-05-03 LAB — COMPREHENSIVE METABOLIC PANEL
ALT: 22 U/L (ref 17–63)
ANION GAP: 11 (ref 5–15)
AST: 58 U/L — ABNORMAL HIGH (ref 15–41)
Albumin: 3.2 g/dL — ABNORMAL LOW (ref 3.5–5.0)
Alkaline Phosphatase: 45 U/L (ref 38–126)
BUN: 12 mg/dL (ref 6–20)
CHLORIDE: 100 mmol/L — AB (ref 101–111)
CO2: 25 mmol/L (ref 22–32)
Calcium: 8.4 mg/dL — ABNORMAL LOW (ref 8.9–10.3)
Creatinine, Ser: 0.92 mg/dL (ref 0.61–1.24)
Glucose, Bld: 96 mg/dL (ref 65–99)
POTASSIUM: 3.8 mmol/L (ref 3.5–5.1)
SODIUM: 136 mmol/L (ref 135–145)
Total Bilirubin: 0.8 mg/dL (ref 0.3–1.2)
Total Protein: 6.1 g/dL — ABNORMAL LOW (ref 6.5–8.1)

## 2017-05-03 LAB — MAGNESIUM: MAGNESIUM: 1.8 mg/dL (ref 1.7–2.4)

## 2017-05-03 LAB — CK: CK TOTAL: 2578 U/L — AB (ref 49–397)

## 2017-05-03 NOTE — Evaluation (Signed)
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  Clinical Impression  Pt is A&Ox4 and agrees to get out of the chair to mobilize. Pt presents with decreased awareness of deficits and safety, and requires VCs and TCs for transfers. Pt states he wants to go home; although he has no home help and has difficulty mobilizing. Pt has deficits listed below in PT problem list and would benefit from continued acute therapy for strengthening, transfers, and mobilization for safe discharge.     Follow Up Recommendations SNF    Equipment Recommendations  None recommended by PT    Recommendations for Other Services       Precautions / Restrictions Precautions Precautions: Fall Restrictions Weight Bearing Restrictions: No      Mobility  Bed Mobility                  Transfers Overall transfer level: Needs assistance   Transfers: Sit to/from Stand Sit to Stand: Mod assist         General transfer comment: mod A to rise into standing from chair and for stability upon achieving standing as pt with heavy posterior lean. VCs and TCs for anterior translation and prevention of posterior lean.   Ambulation/Gait Ambulation/Gait assistance: Min assist Ambulation Distance (Feet): 150 Feet Assistive device: Rolling walker (2 wheeled) Gait Pattern/deviations: Decreased stride length;Shuffle;Narrow base of support;Trunk flexed     General Gait Details: L foot also with increase supination during ambulation. Pt required VCs for upright posture and direction. Chair follow.   Stairs            Wheelchair Mobility    Modified Rankin (Stroke Patients Only)       Balance Overall balance assessment: Needs assistance Sitting-balance support: Feet supported Sitting  balance-Leahy Scale: Fair   Postural control: Posterior lean Standing balance support: Bilateral upper extremity supported Standing balance-Leahy Scale: Poor Standing balance comment: heavy posterior lean, requiring mod-max A to maintain standing position. pt still not realizing his balance deficits at this time.                             Pertinent Vitals/Pain Faces Pain Scale: Hurts even more Pain Location: back and arms  Pain Descriptors / Indicators: Discomfort Pain Intervention(s): Monitored during session    Home Living Family/patient expects to be discharged to:: Private residence Living Arrangements: Alone Available Help at Discharge: Family;Available PRN/intermittently   Home Access: Stairs to enter Entrance Stairs-Rails: Right;Left Entrance Stairs-Number of Steps: 6 Home Layout: One level Home Equipment: Walker - 2 wheels;Cane - single point;Bedside commode;Shower seat;Wheelchair - manual      Prior Function Level of Independence: Needs assistance   Gait / Transfers Assistance Needed: RW for gait at times  ADL's / Homemaking Assistance Needed: nephew helps with errands and appts, food from Meals on Wheels        Hand Dominance        Extremity/Trunk Assessment        Lower Extremity Assessment Lower Extremity Assessment: Generalized weakness RLE Deficits / Details: Pt with strength 4/5 on both LEs  LLE Deficits / Details: coordination and strength, limited L knee flexion    Cervical / Trunk Assessment Cervical / Trunk Assessment: Kyphotic  Communication   Communication: No difficulties  Cognition Arousal/Alertness: Awake/alert Behavior During Therapy: WFL for tasks assessed/performed Overall Cognitive Status: History of cognitive impairments - at baseline                                 General Comments: Pt with very poor awareness of deficits      General Comments      Exercises     Assessment/Plan    PT  Assessment Patient needs continued PT services  PT Problem List Decreased strength;Decreased balance;Decreased mobility;Decreased cognition;Decreased coordination;Decreased knowledge of use of DME;Decreased safety awareness;Pain       PT Treatment Interventions DME instruction;Gait training;Stair training;Functional mobility training    PT Goals (Current goals can be found in the Care Plan section)  Acute Rehab PT Goals Patient Stated Goal: return home    Frequency Min 3X/week   Barriers to discharge Inaccessible home environment;Decreased caregiver support      Co-evaluation               AM-PAC PT "6 Clicks" Daily Activity  Outcome Measure Difficulty turning over in bed (including adjusting bedclothes, sheets and blankets)?: Total Difficulty moving from lying on back to sitting on the side of the bed? : Total Difficulty sitting down on and standing up from a chair with arms (e.g., wheelchair, bedside commode, etc,.)?: A Lot Help needed moving to and from a bed to chair (including a wheelchair)?: A Lot Help needed walking in hospital room?: A Lot Help needed climbing 3-5 steps with a railing? : Total 6 Click Score: 9    End of Session Equipment Utilized During Treatment: Gait belt Activity Tolerance: Patient tolerated treatment well Patient left: in chair;with call bell/phone within reach;with chair alarm set   PT Visit Diagnosis: Other abnormalities of gait and mobility (R26.89);Muscle weakness (generalized) (M62.81)    Time: 5929-2446 PT Time Calculation (min) (ACUTE ONLY): 27 min   Charges:   PT Evaluation $PT Eval Moderate Complexity: 1 Procedure PT Treatments $Gait Training: 8-22 mins   PT G CodesElberta Leatherwood, SPT Acute Rehab [place] 05/03/2017, 2:31 PM

## 2017-05-03 NOTE — Care Management (Addendum)
Patient's PCP is Dr Romero Liner at [facility]. Spoke with Dr Pia Mau nurse Lollie Marrow [phone] ext [phone] . Lollie Marrow is aware patient is admitted. Patient is not service contacted , therefore [facility] will not pay for patient to go to assisted living or SNF. Patient just discharged 05-01-17 see SW note.   Magdalen Spatz RN BSN [phone]

## 2017-05-03 NOTE — Progress Notes (Addendum)
   Subjective: Seen and evaluated today at bedside while eating breakfast. Annoyed about being disrupted. Complains of left-sided pain from fall at home.   Objective:  Vital signs in last 24 hours: Vitals:   05/02/17 1845 05/02/17 1900 05/02/17 2247 05/03/17 0614  BP: (!) 151/98 133/82 113/80 110/70  Pulse:  79 76 72  Resp: 20 16 16 17   Temp:   97.5 F (36.4 C) 98.2 F (36.8 C)  TempSrc:   Oral   SpO2:  98% 97% 100%  Weight:   179 lb 11.2 oz (81.5 kg)   Height:   5\' 10"  (1.778 m)    General: NAD. Resting comfortably in recliner eating breakfast. HEENT: Normocephalic, atraumatic. No scleral icterus. Lungs: Breathing comfortably on room air. Grossly clear.  Heart: Regular rate and rhythm without MGR Abdomen: Soft, nontender, nondistended. +BS Extremities: Warm. No peripheral edema.   Neurologic: Alert and interactive. No gross deficits. Responds to questions appropriately.   Assessment/Plan: #Advanced multilevel lumbar disease with chronic pain and weakness #Gait instability with fall at home Evan Charles is in need of a supervised living situation. This is his 5th admission this year with the same presentation. The patient fell while trying to sit in a chair shortly after discharge and was found down on the floor the next day. Psychiatry consulted last admission and noted he was capable of making his own decisions. His mental status currently similar to last dc.  -PT -SW/CM -Apparently [facility] will not cover SNF or ALF. Will need to be creative with resources.   # Rhabdomyolysis: CK 3300 on admission with normal renal function. This is yet again from his prolonged down time prior to being discovered. Will monitor CK and renal function. Gentle fluid.   # Asymptomatic UTI Bactrim ds BID x 3 days  # Chronic Mental Illness with Hx of Psychosis + hallucinations ## Dementia Noted visual hallucinations at home but aware they are not real.  -Continue Seroquel 50 mg QHS and Rivastigmine 1.5  mg BID  # HTN BP remains stable. Continue Amlodipine 5mg  daily. Holding HCTZ.   Dispo: Anticipated discharge uncertain. The patient needs a supervised living situation.    Evan Tourangeau, DO 05/03/2017, 12:51 PM Pager: [phone]

## 2017-05-03 NOTE — Consult Note (Addendum)
   [facility] Inpatient Consult   05/03/2017  Evan Charles 03/29/1946 [id number]   Spoke with [name] at bedside. He recently signed up for [facility] services. However, [facility] RNCM was unable to reach.   Clarification of [facility] Care Management services made. Due to [name] only seeing the [facility] as Primary Care, [facility] can no longer follow. Confirmed at bedside with [name] that he primarily goes to the [facility] for medical follow up. Made [name] aware that [facility] can not follow due to this.   Discussed above with [facility] Assistant Director.   Made inpatient RNCM aware as well.     Marthenia Rolling, MSN-Ed, RN,BSN [facility] Liaison [phone]

## 2017-05-03 NOTE — Patient Outreach (Signed)
[facility]) Care Management  05/03/2017  Evan Charles [birth year] [id number]   RN attempted outreach call to pt however invalid number remains on the responsive voice message. EPIC indicates pt was readmitted on 6/21 within 24 hours after his discharged on 6/20 for UTI. RN will update hospital liaison of this information. Will continue to follow and report to Valente David, RN for further follow up calls.  Raina Mina, RN Care Management Coordinator [facility] Office [phone]

## 2017-05-04 ENCOUNTER — Encounter (HOSPITAL_COMMUNITY): Payer: Self-pay

## 2017-05-04 LAB — BASIC METABOLIC PANEL
ANION GAP: 8 (ref 5–15)
BUN: 15 mg/dL (ref 6–20)
CO2: 27 mmol/L (ref 22–32)
Calcium: 8.3 mg/dL — ABNORMAL LOW (ref 8.9–10.3)
Chloride: 103 mmol/L (ref 101–111)
Creatinine, Ser: 1.05 mg/dL (ref 0.61–1.24)
Glucose, Bld: 98 mg/dL (ref 65–99)
POTASSIUM: 3.9 mmol/L (ref 3.5–5.1)
Sodium: 138 mmol/L (ref 135–145)

## 2017-05-04 LAB — CK: CK TOTAL: 1484 U/L — AB (ref 49–397)

## 2017-05-04 NOTE — Progress Notes (Signed)
  Date: 05/04/2017  Patient name: Evan Charles  Medical record number: [id number]  Date of birth: 1946-06-28   This patient's plan of care was discussed with the house staff. Please see their note for complete details. I concur with their findings.   Sid Falcon, MD 05/04/2017, 1:56 PM

## 2017-05-04 NOTE — Progress Notes (Signed)
   Subjective:  Patient is sitting in the chair this morning. He is awaiting breakfast. He says he is eating and sleeping well. He says he has chronic back pain. He feels that his lower extremity swelling is improved. He says he feels nervous walking on his own as he is fearful of falling. He says he has a walker and cane at home. He also has his mother's wheelchair at home, which he does not use.   Objective:  Vital signs in last 24 hours: Vitals:   05/03/17 0614 05/03/17 1329 05/03/17 2132 05/04/17 0427  BP: 110/70 107/64 103/65 107/65  Pulse: 72 89 88 75  Resp: 17 17 17 17   Temp: 98.2 F (36.8 C) 97.4 F (36.3 C) 97.7 F (36.5 C) 97.9 F (36.6 C)  TempSrc:  Oral Oral Axillary  SpO2: 100% 100% 98% 99%  Weight:    178 lb (80.7 kg)  Height:       General: NAD. Resting comfortably in reclinert. HEENT: Normocephalic, atraumatic. No scleral icterus. Lungs: Breathing comfortably on room air. Grossly clear.  Heart: Regular rate and rhythm without MGR Abdomen: Soft, nontender, nondistended. Extremities: Warm. No peripheral edema.   Neurologic: Alert and interactive. No gross deficits. Responds to questions appropriately.   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 Darroch is in need of a supervised living situation. This is his 5th admission this year with the same presentation. The patient fell while trying to sit in a chair shortly after discharge and was found down on the floor the next day. Psychiatry consulted last admission and noted he was capable of making his own decisions. His mental status currently similar to last dc. He clearly is unable to adequately care for himself at home. He has a nephew who checks in on him once in a while but cannot provide nearly enough supervision to comfortably allow Mr. Dulude to return to home alone. - Continue PT - SW/CM assistance appreciated - Apparently VA will  not cover SNF or ALF. Will need to be creative with resources.   # Rhabdomyolysis: CK 3300 on admission with normal renal function. This is yet again from his prolonged down time prior to being discovered. Will monitor CK (3337 >> 1484) and renal function. Gentle fluid, monitor I/Os.  # Asymptomatic UTI Bactrim ds BID x 3 days  # Chronic Mental Illness with Hx of Psychosis + hallucinations ## Dementia Noted visual hallucinations at home but aware they are not real.  -Continue Seroquel 50 mg QHS and Rivastigmine 1.5 mg BID  # HTN BP remains stable. Continue Amlodipine 5mg  daily. Holding HCTZ.   Dispo: Anticipated discharge pending arrangement of safe living situation.  Zada Finders, MD 05/04/2017, 8:09 AM

## 2017-05-05 DIAGNOSIS — T796XXA Traumatic ischemia of muscle, initial encounter: Principal | ICD-10-CM

## 2017-05-05 DIAGNOSIS — Y92009 Unspecified place in unspecified non-institutional (private) residence as the place of occurrence of the external cause: Secondary | ICD-10-CM

## 2017-05-05 DIAGNOSIS — W19XXXA Unspecified fall, initial encounter: Secondary | ICD-10-CM

## 2017-05-05 DIAGNOSIS — Z7409 Other reduced mobility: Secondary | ICD-10-CM

## 2017-05-05 LAB — URINE CULTURE

## 2017-05-05 LAB — BASIC METABOLIC PANEL
Anion gap: 10 (ref 5–15)
BUN: 11 mg/dL (ref 6–20)
CHLORIDE: 103 mmol/L (ref 101–111)
CO2: 26 mmol/L (ref 22–32)
CREATININE: 1.18 mg/dL (ref 0.61–1.24)
Calcium: 8.5 mg/dL — ABNORMAL LOW (ref 8.9–10.3)
Glucose, Bld: 95 mg/dL (ref 65–99)
POTASSIUM: 4.3 mmol/L (ref 3.5–5.1)
SODIUM: 139 mmol/L (ref 135–145)

## 2017-05-05 LAB — CK: CK TOTAL: 995 U/L — AB (ref 49–397)

## 2017-05-05 NOTE — Progress Notes (Signed)
   Subjective:  Patient is sitting in the chair comfortably this morning. He ate all of his breakfast. He says he is eating and sleeping well. He his chronic back pain is unchanged. He feels that his lower extremity swelling is improved. He is adamant about not going to a nursing facility on discharge despite his inability to care for himself.   Objective:  Vital signs in last 24 hours: Vitals:   05/04/17 0427 05/04/17 1500 05/04/17 2118 05/05/17 0557  BP: 107/65 103/65 109/69 110/68  Pulse: 75 72 91 76  Resp: 17 17 18 17   Temp: 97.9 F (36.6 C) 97.4 F (36.3 C) 98.3 F (36.8 C) 98.2 F (36.8 C)  TempSrc: Axillary Oral Oral Oral  SpO2: 99% 99% 100% 99%  Weight: 178 lb (80.7 kg)   179 lb 14.3 oz (81.6 kg)  Height:       General: NAD. Resting comfortably in recliner. HEENT: [place]/AT. No scleral icterus. Lungs: Breathing comfortably on room air. Grossly clear.  Heart: Regular rate and rhythm without MGR Abdomen: Soft, nontender, nondistended. GU: Condom cath in place Extremities: Warm. No peripheral edema.   Neurologic: Alert and interactive. No gross deficits. Responds to questions appropriately.   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 Budreau is in need of a supervised living situation. This is his 5th admission this year with the same presentation. The patient fell while trying to sit in a chair shortly after discharge and was found down on the floor the next day. Psychiatry were consulted last admission and noted he was capable of making his own decisions (see Psych note 04/30/17). He clearly is unable to adequately care for himself at home, however is adamant about returning to home on discharge and refuses SNF or ALF. - Continue PT - SW/CM assistance appreciated - Apparently VA will not cover SNF or ALF. Will need to be creative with resources  # Rhabdomyolysis: CK 3300 on admission with normal  renal function. This is yet again from his prolonged down time prior to being discovered. CK 3337 >> 995. Renal function is normal. D/c IVF.  # UTI He reports some dysuria this morning. Urine Cx is growing Citrobacter freundii. - Bactrim ds BID x 3 days  # Chronic Mental Illness with Hx of Psychosis + hallucinations ## Dementia Noted visual hallucinations at home but aware they are not real.  -Continue Seroquel 50 mg QHS and Rivastigmine 1.5 mg BID  # HTN BP remains stable. Continue Amlodipine 5mg  daily. Holding HCTZ.   Dispo: Anticipated discharge pending arrangement of safe living situation.  Zada Finders, MD 05/05/2017, 12:41 PM

## 2017-05-05 NOTE — Progress Notes (Signed)
  Date: 05/05/2017  Patient name: Evan Charles  Medical record number: [id number]  Date of birth: 01-20-1946   I have seen and evaluated this patient and I have discussed the plan of care with the house staff. Please see Dr. Serita Grit note for complete details. I concur with his findings with the following additions/corrections:   Patient will likely be close to discharge tomorrow or Tuesday.  He continues to express desire not to go to ALF or SNF.    Sid Falcon, MD 05/05/2017, 1:09 PM

## 2017-05-06 LAB — BASIC METABOLIC PANEL
ANION GAP: 8 (ref 5–15)
BUN: 13 mg/dL (ref 6–20)
CO2: 29 mmol/L (ref 22–32)
Calcium: 8.8 mg/dL — ABNORMAL LOW (ref 8.9–10.3)
Chloride: 103 mmol/L (ref 101–111)
Creatinine, Ser: 1.12 mg/dL (ref 0.61–1.24)
GFR calc Af Amer: >60 mL/min (ref >60)
GLUCOSE: 93 mg/dL (ref 65–99)
POTASSIUM: 3.8 mmol/L (ref 3.5–5.1)
Sodium: 140 mmol/L (ref 135–145)

## 2017-05-06 LAB — CK: CK TOTAL: 654 U/L — AB (ref 49–397)

## 2017-05-06 NOTE — Progress Notes (Signed)
Discharge instructions reviewed with pt and pt's nephew, [name].  Pt and pt's nephew verbalized understanding and had no questions.  Pt discharged in stable condition via wheelchair with nephew, [name].  Eliezer Bottom Winfred

## 2017-05-06 NOTE — Progress Notes (Signed)
Physical Therapy Treatment Patient Details Name: Evan Charles MRN: [id number] DOB: 09-05-1946 Today's Date: 05/06/2017    History of Present Illness 71 y/o M with PMHx intermittent psychosis, intractable low back pain secondary to DDD of lumbar spine.    PT Comments    Pt continues to show decreased awareness of deficits and decreased safety during transfers and mobilization and continues insisting on d/c to home. Pt requires max VCs for posture and positioning with all transfers, such as sit to stand. Pt would continue to benefit from continued acute therapy to improve strength, mobility, transfer training, and safety awareness in order to d/c to next setting safely Continue to recommend d/c to SNF.    Follow Up Recommendations  SNF     Equipment Recommendations  None recommended by PT    Recommendations for Other Services       Precautions / Restrictions Precautions Precautions: Fall Restrictions Weight Bearing Restrictions: No    Mobility  Bed Mobility Overal bed mobility: Needs Assistance Bed Mobility: Supine to Sit     Supine to sit: Mod assist     General bed mobility comments: Mod assist required to scoot to EOB and put feet on ground. Max VCs to shift trunk anteriorly to reduce significant posterior lean.   Transfers Overall transfer level: Needs assistance   Transfers: Sit to/from Stand Sit to Stand: Mod assist         General transfer comment: mod A initially with sit to stand, as pt with heavy posterior lean. VCs and TCs for anterior translation and prevention of posterior lean. Also max VCs for bending at the trunk and bringing feet back during 5 x sit to stand exercise. Pt able to transfer sit to stand with min guard assist on last 2 sit to stands when given max VCs for positioning.   Ambulation/Gait Ambulation/Gait assistance: Min assist Ambulation Distance (Feet): 200 Feet Assistive device: Rolling walker (2 wheeled) Gait Pattern/deviations:  Decreased stride length;Shuffle;Narrow base of support;Trunk flexed   Gait velocity interpretation: Below normal speed for age/gender General Gait Details: Pt requires VCs for upright posture and direction. No chair follow required today. Pt with decreased posterior lean during ambulation compared to previous session.    Stairs            Wheelchair Mobility    Modified Rankin (Stroke Patients Only)       Balance Overall balance assessment: Needs assistance Sitting-balance support: Bilateral upper extremity supported Sitting balance-Leahy Scale: Fair Sitting balance - Comments: significant posterior lean Postural control: Posterior lean Standing balance support: Bilateral upper extremity supported Standing balance-Leahy Scale: Fair Standing balance comment: Pt able to correct standing posture and decrease posterior lean, only with max VCs.                             Cognition Arousal/Alertness: Awake/alert Behavior During Therapy: Flat affect Overall Cognitive Status: History of cognitive impairments - at baseline                                 General Comments: Pt continues to have poor awareness of deficits and safety. However, when prompted with VCs, patient intermittently says he understands.       Exercises Other Exercises Other Exercises: 5x sit to stand from chair with max VCs for scooting forward, putting feet back, and pushing up from the chair.  General Comments        Pertinent Vitals/Pain Pain Assessment: 0-10 Pain Score: 7  Pain Location: Back Pain Descriptors / Indicators: Discomfort Pain Intervention(s): Monitored during session    Home Living                      Prior Function            PT Goals (current goals can now be found in the care plan section) Acute Rehab PT Goals Patient Stated Goal: return home    Frequency    Min 3X/week      PT Plan Current plan remains appropriate     Co-evaluation              AM-PAC PT "6 Clicks" Daily Activity  Outcome Measure  Difficulty turning over in bed (including adjusting bedclothes, sheets and blankets)?: Total Difficulty moving from lying on back to sitting on the side of the bed? : Total Difficulty sitting down on and standing up from a chair with arms (e.g., wheelchair, bedside commode, etc,.)?: A Lot Help needed moving to and from a bed to chair (including a wheelchair)?: A Lot Help needed walking in hospital room?: A Lot Help needed climbing 3-5 steps with a railing? : Total 6 Click Score: 9    End of Session Equipment Utilized During Treatment: Gait belt Activity Tolerance: Patient tolerated treatment well Patient left: in chair;with call bell/phone within reach;with chair alarm set   PT Visit Diagnosis: Other abnormalities of gait and mobility (R26.89);Muscle weakness (generalized) (M62.81)     Time: 3606-7703 PT Time Calculation (min) (ACUTE ONLY): 33 min  Charges:  $Gait Training: 8-22 mins $Therapeutic Activity: 8-22 mins                    G Codes:       Elberta Leatherwood, SPT Acute Rehab [place] 05/06/2017, 10:24 AM

## 2017-05-06 NOTE — Progress Notes (Signed)
   Subjective:  Patient is sitting in the chair comfortably this morning. No complaints. Wants to go home. Adamant about refusal of SNF or ALF.   Objective:  Vital signs in last 24 hours: Vitals:   05/05/17 0557 05/05/17 1311 05/05/17 2036 05/06/17 0606  BP: 110/68 117/69 (!) 103/55 111/69  Pulse: 76 93 90 99  Resp: 17 18 19 19   Temp: 98.2 F (36.8 C) 97.7 F (36.5 C) 98 F (36.7 C) 97.9 F (36.6 C)  TempSrc: Oral Oral Oral Oral  SpO2: 99% 100% 100% 100%  Weight: 179 lb 14.3 oz (81.6 kg)   179 lb 12.5 oz (81.5 kg)  Height:       General: NAD. Resting comfortably in recliner. HEENT: [place]/AT. No scleral icterus. Lungs: Breathing comfortably on room air. Grossly clear.  Heart: Regular rate and rhythm without MGR Abdomen: Soft, nontender, nondistended. Extremities: Warm. No peripheral edema.   Neurologic: Alert and interactive. No gross deficits. Responds to questions appropriately.   Assessment/Plan: #Advanced multilevel lumbar disease with chronic pain and weakness #Gait instability with fall at home Evan Charles is in need of a supervised living situation. This is his 5th admission this year with the same presentation. That being said, he adamantly refuses placement and has been found competent to make his own medical decisions by psychiatry several times, most recently less than 1 week ago. VA will apparently not cover SNF, ALF or [facility]. We will establish the maximum amount of home health services possible prior to discharge.  - SW/CM assistance appreciated -Likely dc today IF appropriate HH services can be arranged  # Rhabdomyolysis: CK 3300 on admission with normal renal function. CK today 654 and cr stills table.   # UTI Urine Cx is growing Citrobacter freundii. - S/p Bactrim ds BID x 3 days  # Chronic Mental Illness with Hx of Psychosis + hallucinations ## Dementia -Continue Seroquel 50 mg QHS and Rivastigmine 1.5 mg BID  # HTN BP remains stable. Continue Amlodipine 5mg   daily. Holding HCTZ.   Dispo: Anticipated discharge today pending arrangement of suitable home health services.   Evan Stthomas, DO 05/06/2017, 1:32 PM

## 2017-05-06 NOTE — Care Management Note (Addendum)
Case Management Note  Patient Details  Name: Evan Charles MRN: [id number] Date of Birth: 1946/07/08  Subjective/Objective:                    Action/Plan:  Discussed discharge planning with patient. Patient wants to go home. Wyatt Haste with home health orders . Patient aware discharge is today, he wanted NCM to call his [name] to transport him home. Called [name] [phone] two times no answer and voicemail has not been set up.   Patient does have keys to get in his home. Will continue to call [name] , if unable to reach can arrange ambulance transport home.  Explained to patient can arrange an ambulance ride home if we are unable to reach [place] or someone else to pick him up . Patient would like to wait and call his ex wife , she gets off work between 1600 and 1700, she may be able to pick him up.   NCM will leave ambulance papers and phone number in shadow chart just in case needed. Patient and bedside nurse aware. Confirmed face sheet information with patient. Expected Discharge Date:                  Expected Discharge Plan:  [place]  In-House Referral:     Discharge planning Services  CM Consult  Post Acute Care Choice:  Home Health Choice offered to:  Patient  DME Arranged:    DME Agency:  [facility]  HH Arranged:  RN, PT, OT, Nurse's Aide, Social Work [employer] Agency:  [place]  Status of Service:  Completed, signed off  If discussed at [employer] of Stay Meetings, dates discussed:    Additional Comments:  Marilu Favre, RN 05/06/2017, 2:27 PM

## 2017-05-06 NOTE — Discharge Summary (Signed)
Medicine attending discharge note: I personally examined this patient on the day of discharge and I attest to the accuracy of the discharge evaluation and plan as recorded in the final daily progress note by resident physician Dr. Romelle Starcher Molt.  Readmission within 24 hours for this 71 year old [place] veteran who lives by himself.  He initially came in to be evaluated for acute on chronic low back pain secondary to advanced degenerative changes and spinal stenosis.  He also has a history of unspecified psychosis.  He was having some intermittent hallucinations.  These resolved when he was back on his medications.  We were concerned by reports of the ambulance team rate the status of the patient's home and personal hygiene.  The patient was adamant that he return to his own home and declined a nursing facility.  He was seen in consultation by psychiatry prior to discharge from the first hospitalization who felt that he was competent to make decisions on his own behalf. When he got home, he slid out of a chair.  Healthcare workers found him on the floor.  Incontinent of urine.  He was transported back to the hospital. He was awake and alert and oriented.  No fractures on survey x-rays of his bones.  Only new finding was a grossly infected urine.  He had no urinary tract symptoms.  We did elect to treat him.  Urine cultures grew Citrobacter.  Blood cultures were sterile. CBC showed normal white count 8100.  Hemoglobin 13. Blood chemistry with no major electrolyte abnormalities.  Once again we strongly advised that the patient go into an assisted living situation and he once again adamantly refused.  We told him we were concerned for his safety.  This made no impression on him.  He thought I was just being negative.  Disposition: Condition stable medically for discharge Medical follow-up at the [facility] There were no complications

## 2017-05-07 ENCOUNTER — Other Ambulatory Visit: Payer: Self-pay | Admitting: *Deleted

## 2017-05-07 DIAGNOSIS — R531 Weakness: Secondary | ICD-10-CM | POA: Diagnosis not present

## 2017-05-07 NOTE — Patient Outreach (Signed)
[facility]) Care Management  05/07/2017  YUNIEL BLANEY 1946-01-07 [id number]   Notified by hospital liaison that member is not eligible for program.  Case closed, member marked not active.  Valente David, [place], MSN [place] [phone]

## 2017-05-08 LAB — CULTURE, BLOOD (ROUTINE X 2)
CULTURE: NO GROWTH
CULTURE: NO GROWTH
SPECIAL REQUESTS: ADEQUATE
Special Requests: ADEQUATE

## 2017-05-08 NOTE — Discharge Summary (Signed)
Name: Evan Charles MRN: [id number] DOB: 26-Jan-1946 71 y.o. PCP: [facility]  Date of Admission: 04/28/2017 12:11 PM Date of Discharge: 05/06/2017 Attending Physician: Dr. Annia Belt, MD  Discharge Diagnosis: 1. Spinal stenosis  2. Failure to thrive in an adult 3. Urinary tract infection  Principal Problem:   Spinal stenosis of lumbar region without neurogenic claudication Active Problems:   GERD without esophagitis   Bilateral lower extremity edema   Failure to thrive in adult   Essential hypertension   Leg weakness   Discharge Medications: Allergies as of 05/01/2017   No Known Allergies     Medication List    STOP taking these medications   naproxen 500 MG tablet Commonly known as:  NAPROSYN   traMADol 50 MG tablet Commonly known as:  ULTRAM     TAKE these medications   amLODipine 5 MG tablet Commonly known as:  NORVASC Take 1 tablet (5 mg total) by mouth daily.   cholecalciferol 1000 units tablet Commonly known as:  VITAMIN D Take 1,000 Units by mouth daily.   docusate sodium 100 MG capsule Commonly known as:  COLACE Take 1 capsule (100 mg total) by mouth 2 (two) times daily as needed for moderate constipation.   furosemide 80 MG tablet Commonly known as:  LASIX Take 80 mg by mouth daily.   hydrochlorothiazide 12.5 MG capsule Commonly known as:  MICROZIDE Take 1 capsule (12.5 mg total) by mouth daily.   pantoprazole 40 MG tablet Commonly known as:  PROTONIX Take 1 tablet (40 mg total) by mouth 2 (two) times daily.   QUEtiapine 50 MG tablet Commonly known as:  SEROQUEL Take 50 mg by mouth at bedtime. What changed:  Another medication with the same name was removed. Continue taking this medication, and follow the directions you see here.   rivastigmine 1.5 MG capsule Commonly known as:  EXELON Take 1.5 mg by mouth 2 (two) times daily.   urea 20 % cream Commonly known as:  CARMOL Apply 1 application topically daily.         Disposition and follow-up:   Evan Charles was discharged from [facility] in Stable condition.  At the hospital follow up visit please address:  1.  Please address the patient's ability to care for himself at home and if he has changed his mind about SNF versus assisted living facility placement. The maximum amount of home health services were ordered for the patient on discharge, please ensure he is still receiving these services.  2.  Labs / imaging needed at time of follow-up: None  3.  Pending labs/ test needing follow-up: None  Follow-up Appointments: Follow-up Information    [facility], [facility] Follow up.   Specialty:  Home Health Services Why:  Home health services arranged, office will call and setup home visit Contact information: [address] [place] [address] [phone]        Clinic, [facility] Follow up.   Contact information: [place] [address] [phone]           Hospital Course by problem list: Principal Problem:   Spinal stenosis of lumbar region without neurogenic claudication Active Problems:   GERD without esophagitis   Bilateral lower extremity edema   Failure to thrive in adult   Essential hypertension   Leg weakness   Severe spinal stenosis Failure to thrive Rhabdomyolysis 71 year old male readmitted within 24 hours after discharge following hospitalization for  acute on chronic low back pain secondary to advanced degenerative disease and spinal stenosis. He was found covered in his own feces, urine, food and his house was in disarray. He will adamantly refused skilled nursing facility placement or assisted living facility placement and was felt to be competent to make his own medical decisions by psychiatry. Home health services were arranged for the patient and on their initial visit the day after his first discharge today found him again on the  floor covered in urine after he fell trying to sit in a chair. They brought him in for competency evaluation. Again he was found to be in rhabdomyolysis with CK of over 3000 but with stable renal function. Patient again adamantly refused skilled nursing facility placement or assisted living facility placement and he again was felt to be competent to make his own medical decisions. He was discharged home with home health services with close follow-up at the [place].  Urinary tract infection UA obtained in ED demonstrated infection and urine culture grew Citrobacter. Blood cultures were negative. He was treated with Bactrim DS twice a day 3 days.  Discharge Vitals:   BP 108/71 (BP Location: Left Arm)   Pulse 93   Temp 97.8 F (36.6 C)   Resp 18   Ht 5\' 9"  (1.753 m)   Wt 175 lb 3.2 oz (79.5 kg)   SpO2 98%   BMI 25.87 kg/m   Pertinent Labs, Studies, and Procedures:  MRI lumbar spine: Stable advanced severe changes of lumbar spine with spinal stenosis and nerve compression Plain films of the shoulder, chest, pelvis, femur: Negative for fracture. Negative for infiltrate MRI brain, cervical spine: Advanced facet arthropathy and spinal canal stenosis throughout the cervical spine. Normal MRI brain Blood cultures: No growth Urine culture: Pansensitive Citrobacter  Discharge Instructions: Discharge Instructions    AMB Referral to [place] Management    Complete by:  As directed    Please assign to [place] transition of care and [facility] LCSW for community resources and encouragement for applying for Medicaid or accessing VA benefits. Will have home health thru [facility] program. Please contact hospital liaison once referral received. Written consent obtained. To discharge home today 05/01/17. Please see hospital liaison notes and inpatient LCSW notes as well. Thanks.Marthenia Rolling, MSN-Ed, [facility] [phone]   Reason for consult:  Please assign to [place]  and [facility] RNCM   Expected date of contact:  1-3 days (reserved for hospital discharges)     Signed: Einar Gip, DO 05/08/2017, 1:59 PM   Pager: [phone]

## 2017-05-29 ENCOUNTER — Encounter (HOSPITAL_COMMUNITY): Payer: Self-pay | Admitting: *Deleted

## 2017-05-29 ENCOUNTER — Emergency Department (HOSPITAL_COMMUNITY): Payer: Medicare Other

## 2017-05-29 ENCOUNTER — Emergency Department (HOSPITAL_COMMUNITY)
Admission: EM | Admit: 2017-05-29 | Discharge: 2017-05-29 | Disposition: A | Discharge status: Home / Self Care | Payer: Medicare Other | Attending: Emergency Medicine | Admitting: Emergency Medicine

## 2017-05-29 DIAGNOSIS — R2242 Localized swelling, mass and lump, left lower limb: Secondary | ICD-10-CM | POA: Diagnosis present

## 2017-05-29 DIAGNOSIS — R600 Localized edema: Secondary | ICD-10-CM | POA: Insufficient documentation

## 2017-05-29 DIAGNOSIS — Z87891 Personal history of nicotine dependence: Secondary | ICD-10-CM | POA: Insufficient documentation

## 2017-05-29 DIAGNOSIS — F0390 Unspecified dementia without behavioral disturbance: Secondary | ICD-10-CM | POA: Diagnosis not present

## 2017-05-29 DIAGNOSIS — Z79899 Other long term (current) drug therapy: Secondary | ICD-10-CM | POA: Insufficient documentation

## 2017-05-29 DIAGNOSIS — R443 Hallucinations, unspecified: Secondary | ICD-10-CM

## 2017-05-29 DIAGNOSIS — Z8546 Personal history of malignant neoplasm of prostate: Secondary | ICD-10-CM | POA: Insufficient documentation

## 2017-05-29 DIAGNOSIS — I10 Essential (primary) hypertension: Secondary | ICD-10-CM | POA: Diagnosis not present

## 2017-05-29 DIAGNOSIS — R609 Edema, unspecified: Secondary | ICD-10-CM

## 2017-05-29 LAB — COMPREHENSIVE METABOLIC PANEL
ALK PHOS: 47 U/L (ref 38–126)
ALT: 11 U/L — AB (ref 17–63)
AST: 17 U/L (ref 15–41)
Albumin: 3.7 g/dL (ref 3.5–5.0)
Anion gap: 9 (ref 5–15)
BILIRUBIN TOTAL: 1.2 mg/dL (ref 0.3–1.2)
BUN: 11 mg/dL (ref 6–20)
CO2: 24 mmol/L (ref 22–32)
CREATININE: 1.07 mg/dL (ref 0.61–1.24)
Calcium: 8.7 mg/dL — ABNORMAL LOW (ref 8.9–10.3)
Chloride: 107 mmol/L (ref 101–111)
GFR calc Af Amer: >60 mL/min (ref >60)
GFR calc non Af Amer: >60 mL/min (ref >60)
Glucose, Bld: 76 mg/dL (ref 65–99)
Potassium: 3.6 mmol/L (ref 3.5–5.1)
Sodium: 140 mmol/L (ref 135–145)
TOTAL PROTEIN: 6.4 g/dL — AB (ref 6.5–8.1)

## 2017-05-29 LAB — BRAIN NATRIURETIC PEPTIDE: B Natriuretic Peptide: 10.7 pg/mL (ref 0.0–100.0)

## 2017-05-29 LAB — CBC WITH DIFFERENTIAL/PLATELET
BASOS ABS: 0 10*3/uL (ref 0.0–0.1)
Basophils Relative: 1 %
Eosinophils Absolute: 0.1 10*3/uL (ref 0.0–0.7)
Eosinophils Relative: 3 %
HEMATOCRIT: 38.2 % — AB (ref 39.0–52.0)
Hemoglobin: 12.1 g/dL — ABNORMAL LOW (ref 13.0–17.0)
LYMPHS PCT: 34 %
Lymphs Abs: 1.6 10*3/uL (ref 0.7–4.0)
MCH: 27.4 pg (ref 26.0–34.0)
MCHC: 31.7 g/dL (ref 30.0–36.0)
MCV: 86.4 fL (ref 78.0–100.0)
MONO ABS: 0.4 10*3/uL (ref 0.1–1.0)
Monocytes Relative: 7 %
NEUTROS ABS: 2.7 10*3/uL (ref 1.7–7.7)
Neutrophils Relative %: 55 %
Platelets: 234 10*3/uL (ref 150–400)
RBC: 4.42 MIL/uL (ref 4.22–5.81)
RDW: 15 % (ref 11.5–15.5)
WBC: 4.9 10*3/uL (ref 4.0–10.5)

## 2017-05-29 LAB — TROPONIN I

## 2017-05-29 MED ORDER — HYDROCODONE-ACETAMINOPHEN 5-325 MG PO TABS
1.0000 | ORAL_TABLET | Freq: Once | ORAL | Status: AC
  Administered 2017-05-29: 1 via ORAL
  Filled 2017-05-29: qty 1

## 2017-05-29 MED ORDER — TRAMADOL HCL 50 MG PO TABS: 50.0000 mg | ORAL_TABLET | Freq: Four times a day (QID) | ORAL | 0 refills | Status: DC | PRN

## 2017-05-29 MED ORDER — FUROSEMIDE 10 MG/ML IJ SOLN
80.0000 mg | Freq: Once | INTRAMUSCULAR | Status: AC
  Administered 2017-05-29: 80 mg via INTRAVENOUS
  Filled 2017-05-29: qty 8

## 2017-05-29 MED ORDER — FUROSEMIDE 80 MG PO TABS: 80.0000 mg | ORAL_TABLET | Freq: Two times a day (BID) | ORAL | 0 refills | Status: DC

## 2017-05-29 MED ORDER — QUETIAPINE FUMARATE 50 MG PO TABS: 50.0000 mg | ORAL_TABLET | Freq: Every day | ORAL | 0 refills | Status: DC

## 2017-05-29 NOTE — ED Notes (Signed)
Patient transported to X-ray 

## 2017-05-29 NOTE — ED Provider Notes (Signed)
[facility] DEPT Provider Note   CSN: [id number] Arrival date & time: 05/29/17  1058     History   Chief Complaint Chief Complaint  Patient presents with  . Leg Swelling    HPI Evan Charles is a 71 y.o. male.   Leg Pain   This is a chronic problem. The current episode started more than 2 days ago. The problem occurs constantly. The problem has been gradually worsening. The pain is present in the left lower leg and right lower leg. The quality of the pain is described as aching, dull and sharp. The pain is moderate. He has tried nothing for the symptoms.    Past Medical History:  Diagnosis Date  . Back pain   . Bilateral lower extremity edema 04/2017  . Chronic mental illness   . Colitis   . Dementia   . GERD (gastroesophageal reflux disease)   . Hypertension   . Neuropathy    " MY HANDS "  . Prostate cancer ([place])   . UTI (urinary tract infection) 04/2017    Patient Active Problem List   Diagnosis Date Noted  . UTI (urinary tract infection) 05/02/2017  . Leg weakness 04/28/2017  . 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 Impaired mobility and ADLs   . Hypokalemia 11/05/2016  . Dementia with psychosis 09/12/2016  . Acute delirium 09/08/2016  . Rhabdomyolysis 09/08/2016  . Normocytic anemia 09/08/2016  . Altered mental status 09/08/2016  . Fall at home   . UTI (lower urinary tract infection) 07/25/2016  . Mild cognitive impairment 07/25/2016  . 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 Immobility 03/20/2016  . Generalized weakness 03/20/2016  . Failure to thrive in adult 03/20/2016  . Neuropathy   . Essential hypertension   . Physical deconditioning   . 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 GERD without esophagitis 12/19/2015  . Bilateral lower extremity edema 12/19/2015  . Intractable low back pain 02/14/2015  . Difficulty walking 02/14/2015  . H/O: upper GI bleed   . Back pain at L4-L5  level   . Visual hallucinations   . Psychoses     Past Surgical History:  Procedure Laterality Date  . KNEE ARTHROSCOPY    . LAMINECTOMY    . PROSTATECTOMY         Home Medications    Prior to Admission medications   Medication Sig Start Date End Date Taking? Authorizing Provider  amLODipine (NORVASC) 5 MG tablet Take 1 tablet (5 mg total) by mouth daily. 04/30/17   Zada Finders, MD  cholecalciferol (VITAMIN D) 1000 units tablet Take 1,000 Units by mouth daily.    [provider]  docusate sodium (COLACE) 100 MG capsule Take 1 capsule (100 mg total) by mouth 2 (two) times daily as needed for moderate constipation. 04/30/17   Zada Finders, MD  furosemide (LASIX) 80 MG tablet Take 1 tablet (80 mg total) by mouth 2 (two) times daily. For 5 days then return to once daily. 05/29/17   Urijah Raynor, Corene Cornea, MD  hydrochlorothiazide (MICROZIDE) 12.5 MG capsule Take 1 capsule (12.5 mg total) by mouth daily. 04/30/17   Zada Finders, MD  naproxen (NAPROSYN) 500 MG tablet Take 500 mg by mouth 2 (two) times daily with a meal.    [provider]  pantoprazole (PROTONIX) 40 MG tablet Take 1 tablet (40 mg total) by mouth 2 (two) times daily. 12/16/15   Leo Grosser, MD  QUEtiapine (SEROQUEL) 50 MG tablet Take  1-2 tablets (50-100 mg total) by mouth at bedtime. 05/29/17   Jaquavis Felmlee, Corene Cornea, MD  rivastigmine (EXELON) 1.5 MG capsule Take 1.5 mg by mouth 2 (two) times daily. 04/05/17   [provider]  traMADol (ULTRAM) 50 MG tablet Take 1 tablet (50 mg total) by mouth every 6 (six) hours as needed. 05/29/17   Hunner Garcon, Corene Cornea, MD  urea (CARMOL) 20 % cream Apply 1 application topically daily.    [provider]    Family History Family History  Problem Relation Age of Onset  . Cancer Brother   . Cancer Maternal Grandmother     Social History Social History  Substance Use Topics  . Smoking status: Former Smoker    Types: Cigarettes  . Smokeless tobacco: Never Used  . Alcohol  use No     Allergies   Patient has no known allergies.   Review of Systems Review of Systems  All other systems reviewed and are negative.    Physical Exam Updated Vital Signs BP (!) 144/92 (BP Location: Right Arm)   Pulse 84   Temp 98 F (36.7 C) (Oral)   Resp 16   Ht 5\' 9"  (1.753 m)   Wt 86.2 kg (190 lb)   SpO2 97%   BMI 28.06 kg/m   Physical Exam  Constitutional: He is oriented to person, place, and time. He appears well-developed and well-nourished.  HENT:  Head: Normocephalic and atraumatic.  Eyes: Conjunctivae and EOM are normal.  Neck: Normal range of motion.  Cardiovascular: Normal rate.   Pulmonary/Chest: Effort normal. No respiratory distress.  Abdominal: Soft. He exhibits no distension.  Musculoskeletal: Normal range of motion. He exhibits no edema or deformity.  Neurological: He is alert and oriented to person, place, and time. No cranial nerve deficit. Coordination normal.  Skin: Skin is warm and dry.  Nursing note and vitals reviewed.    ED Treatments / Results  Labs (all labs ordered are listed, but only abnormal results are displayed) Labs Reviewed  CBC WITH DIFFERENTIAL/PLATELET - Abnormal; Notable for the following:       Result Value   Hemoglobin 12.1 (*)    HCT 38.2 (*)    All other components within normal limits  COMPREHENSIVE METABOLIC PANEL - Abnormal; Notable for the following:    Calcium 8.7 (*)    Total Protein 6.4 (*)    ALT 11 (*)    All other components within normal limits  TROPONIN I  BRAIN NATRIURETIC PEPTIDE    EKG  EKG Interpretation  Date/Time:  Wednesday May 29 2017 14:05:54 EDT Ventricular Rate:  74 PR Interval:    QRS Duration: 155 QT Interval:  455 QTC Calculation: 505 R Axis:   97 Text Interpretation:  Sinus rhythm RBBB and LPFB No significant change since last tracing in June Confirmed by Merrily Pew [phone]) on 05/29/2017 3:06:39 PM       Radiology Dg Chest 2 View  Result Date:  05/29/2017 CLINICAL DATA:  Lower extremity edema and pain. EXAM: CHEST  2 VIEW COMPARISON:  06/31/2018, 11/02/2016. FINDINGS: Mediastinum hilar structures stable. Heart size stable. Mild left base subsegmental atelectasis and/or scarring again noted. No prominent pleural effusion. No pneumothorax. Stable elevation left hemidiaphragm. IMPRESSION: Mild left base subsegmental atelectasis and/or scarring. Stable elevation left hemidiaphragm. No acute abnormality identified. Electronically Signed   By: Marcello Moores  Register   On: 05/29/2017 14:07    Procedures Procedures (including critical care time)  Medications Ordered in ED Medications  HYDROcodone-acetaminophen (NORCO/VICODIN) 5-325 MG per  tablet 1 tablet (1 tablet Oral Given 05/29/17 1341)  furosemide (LASIX) injection 80 mg (80 mg Intravenous Given 05/29/17 1530)     Initial Impression / Assessment and Plan / ED Course  I have reviewed the triage vital signs and the nursing notes.  Pertinent labs & imaging results that were available during my care of the patient were reviewed by me and considered in my medical decision making (see chart for details).    Acute worsening of chronic LE edema with some pain. No dyspnea. Exam otherwise reassurring. Will check labs/cxr to ensure no significant HF, otherwise likely stable for discharge.  Patient without significant fluid overload but now his nephew States he actually does not have any Lasix at home and needs a refill. He also needs a refill for his Seroquel and tramadol as they are in the mail but will not be here in a couple days.  Final Clinical Impressions(s) / ED Diagnoses   Final diagnoses:  Peripheral edema  Hallucinations    New Prescriptions Discharge Medication List as of 05/29/2017  3:34 PM    START taking these medications   Details  traMADol (ULTRAM) 50 MG tablet Take 1 tablet (50 mg total) by mouth every 6 (six) hours as needed., Starting Wed 05/29/2017, Print         Lissy Deuser,  Corene Cornea, MD 05/29/17 1640

## 2017-05-29 NOTE — ED Notes (Signed)
Will obtain EKG when pt returns from xray.

## 2017-05-29 NOTE — ED Triage Notes (Signed)
Pt reports ongoing bilateral leg swelling and pain, hx of same but having difficulty ambulating.

## 2017-05-31 ENCOUNTER — Emergency Department (HOSPITAL_COMMUNITY)
Admission: EM | Admit: 2017-05-31 | Discharge: 2017-06-01 | Disposition: A | Discharge status: Home / Self Care | Payer: Medicare Other | Attending: Emergency Medicine | Admitting: Emergency Medicine

## 2017-05-31 ENCOUNTER — Emergency Department (HOSPITAL_COMMUNITY): Payer: Medicare Other

## 2017-05-31 ENCOUNTER — Encounter (HOSPITAL_COMMUNITY): Payer: Self-pay | Admitting: Emergency Medicine

## 2017-05-31 DIAGNOSIS — Z87891 Personal history of nicotine dependence: Secondary | ICD-10-CM | POA: Diagnosis not present

## 2017-05-31 DIAGNOSIS — F0390 Unspecified dementia without behavioral disturbance: Secondary | ICD-10-CM | POA: Insufficient documentation

## 2017-05-31 DIAGNOSIS — M7989 Other specified soft tissue disorders: Secondary | ICD-10-CM

## 2017-05-31 DIAGNOSIS — Z79899 Other long term (current) drug therapy: Secondary | ICD-10-CM | POA: Diagnosis not present

## 2017-05-31 DIAGNOSIS — R2243 Localized swelling, mass and lump, lower limb, bilateral: Secondary | ICD-10-CM | POA: Diagnosis not present

## 2017-05-31 DIAGNOSIS — I10 Essential (primary) hypertension: Secondary | ICD-10-CM | POA: Diagnosis not present

## 2017-05-31 DIAGNOSIS — R600 Localized edema: Secondary | ICD-10-CM | POA: Diagnosis not present

## 2017-05-31 DIAGNOSIS — Z8546 Personal history of malignant neoplasm of prostate: Secondary | ICD-10-CM | POA: Insufficient documentation

## 2017-05-31 LAB — CBC
HEMATOCRIT: 38.8 % — AB (ref 39.0–52.0)
HEMOGLOBIN: 12.3 g/dL — AB (ref 13.0–17.0)
MCH: 26.9 pg (ref 26.0–34.0)
MCHC: 31.7 g/dL (ref 30.0–36.0)
MCV: 84.9 fL (ref 78.0–100.0)
Platelets: 223 10*3/uL (ref 150–400)
RBC: 4.57 MIL/uL (ref 4.22–5.81)
RDW: 15.1 % (ref 11.5–15.5)
WBC: 4.3 10*3/uL (ref 4.0–10.5)

## 2017-05-31 LAB — I-STAT TROPONIN, ED: TROPONIN I, POC: 0 ng/mL (ref 0.00–0.08)

## 2017-05-31 LAB — BASIC METABOLIC PANEL
ANION GAP: 10 (ref 5–15)
BUN: 14 mg/dL (ref 6–20)
CALCIUM: 9 mg/dL (ref 8.9–10.3)
CO2: 23 mmol/L (ref 22–32)
Chloride: 106 mmol/L (ref 101–111)
Creatinine, Ser: 0.95 mg/dL (ref 0.61–1.24)
GLUCOSE: 99 mg/dL (ref 65–99)
POTASSIUM: 3.4 mmol/L — AB (ref 3.5–5.1)
Sodium: 139 mmol/L (ref 135–145)

## 2017-05-31 LAB — BRAIN NATRIURETIC PEPTIDE: B Natriuretic Peptide: 17.3 pg/mL (ref 0.0–100.0)

## 2017-05-31 MED ORDER — FUROSEMIDE 10 MG/ML IJ SOLN
60.0000 mg | Freq: Once | INTRAMUSCULAR | Status: AC
  Administered 2017-05-31: 60 mg via INTRAVENOUS
  Filled 2017-05-31: qty 6

## 2017-05-31 NOTE — ED Triage Notes (Signed)
Pt reports BLE swelling worse than usual, reports cramping and tingling pain to BUE, BLE, back, intermittent x 1 week.  Pt reports chronic hallucinations worse than usual, states, "My medicine doesn't make them [the group of people] go away anymore." Pt reports being seen for same at [facility] on Wednesday, no improvement. Pt denies CP, SOB.

## 2017-05-31 NOTE — ED Provider Notes (Signed)
[facility] DEPT Provider Note   CSN: [id number] Arrival date & time: 05/31/17  1713     History   Chief Complaint Chief Complaint  Patient presents with  . Leg Swelling  . Hallucinations    HPI Evan Charles is a 71 y.o. male with history of chronic leg swelling coming in today with continued swelling and pain in his legs. No shortness of breath. Patient states it has gotten a little bit worse since his discharge a few days ago. Reports he has not taken any of his Lasix. Denies any shortness of breath and only endorses pain and cramping in his legs. No nausea, vomiting, chest pain, abdominal pain. Of note, patient does endorse some occasional visual hallucinations however they are not auditory and do not tell him to hurt himself. Denies any SI, HI. Taking Seroquel for that and he does report compliance with medication.  HPI  Past Medical History:  Diagnosis Date  . Back pain   . Bilateral lower extremity edema 04/2017  . Chronic mental illness   . Colitis   . Dementia   . GERD (gastroesophageal reflux disease)   . Hypertension   . Neuropathy    " MY HANDS "  . Prostate cancer ([place])   . UTI (urinary tract infection) 04/2017    Patient Active Problem List   Diagnosis Date Noted  . UTI (urinary tract infection) 05/02/2017  . Leg weakness 04/28/2017  . 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 Impaired mobility and ADLs   . Hypokalemia 11/05/2016  . Dementia with psychosis 09/12/2016  . Acute delirium 09/08/2016  . Rhabdomyolysis 09/08/2016  . Normocytic anemia 09/08/2016  . Altered mental status 09/08/2016  . Fall at home   . UTI (lower urinary tract infection) 07/25/2016  . Mild cognitive impairment 07/25/2016  . 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 Immobility 03/20/2016  . Generalized weakness 03/20/2016  . Failure to thrive in adult 03/20/2016  . Neuropathy   . Essential hypertension   . Physical  deconditioning   . 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 GERD without esophagitis 12/19/2015  . Bilateral lower extremity edema 12/19/2015  . Intractable low back pain 02/14/2015  . Difficulty walking 02/14/2015  . H/O: upper GI bleed   . Back pain at L4-L5 level   . Visual hallucinations   . Psychoses     Past Surgical History:  Procedure Laterality Date  . KNEE ARTHROSCOPY    . LAMINECTOMY    . PROSTATECTOMY         Home Medications    Prior to Admission medications   Medication Sig Start Date End Date Taking? Authorizing Provider  cholecalciferol (VITAMIN D) 1000 units tablet Take 1,000 Units by mouth daily.   Yes [provider]  docusate sodium (COLACE) 100 MG capsule Take 1 capsule (100 mg total) by mouth 2 (two) times daily as needed for moderate constipation. 04/30/17  Yes Zada Finders, MD  furosemide (LASIX) 80 MG tablet Take 1 tablet (80 mg total) by mouth 2 (two) times daily. For 5 days then return to once daily. 05/29/17  Yes Mesner, Corene Cornea, MD  hydrochlorothiazide (MICROZIDE) 12.5 MG capsule Take 1 capsule (12.5 mg total) by mouth daily. 04/30/17  Yes Zada Finders, MD  naproxen (NAPROSYN) 500 MG tablet Take 500 mg by mouth 2 (two) times daily with a meal.   Yes [provider]  pantoprazole (PROTONIX) 40 MG tablet Take 1 tablet (40 mg  total) by mouth 2 (two) times daily. 12/16/15  Yes Leo Grosser, MD  QUEtiapine (SEROQUEL) 50 MG tablet Take 1-2 tablets (50-100 mg total) by mouth at bedtime. 05/29/17  Yes Mesner, Corene Cornea, MD  traMADol (ULTRAM) 50 MG tablet Take 1 tablet (50 mg total) by mouth every 6 (six) hours as needed. Patient taking differently: Take 50 mg by mouth every 6 (six) hours as needed for moderate pain.  05/29/17  Yes Mesner, Corene Cornea, MD  urea (CARMOL) 20 % cream Apply 1 application topically daily.   Yes [provider]  amLODipine (NORVASC) 5 MG tablet Take 1 tablet (5 mg total) by mouth daily. 04/30/17   Zada Finders, MD  rivastigmine (EXELON) 1.5 MG capsule Take 1.5 mg by mouth 2 (two) times daily. 04/05/17   [provider]    Family History Family History  Problem Relation Age of Onset  . Cancer Brother   . Cancer Maternal Grandmother     Social History Social History  Substance Use Topics  . Smoking status: Former Smoker    Types: Cigarettes  . Smokeless tobacco: Never Used  . Alcohol use No     Allergies   Patient has no known allergies.   Review of Systems Review of Systems  Constitutional: Negative for chills and fever.  HENT: Negative for ear pain and sore throat.   Eyes: Negative for pain and visual disturbance.  Respiratory: Negative for cough and shortness of breath.   Cardiovascular: Positive for leg swelling. Negative for chest pain and palpitations.  Gastrointestinal: Negative for abdominal pain and vomiting.  Genitourinary: Negative for dysuria and hematuria.  Musculoskeletal: Positive for gait problem. Negative for arthralgias and back pain.  Skin: Negative for color change and rash.  Neurological: Negative for seizures and syncope.  Psychiatric/Behavioral: Positive for hallucinations. Negative for agitation and behavioral problems.  All other systems reviewed and are negative.    Physical Exam Updated Vital Signs BP (!) 109/96   Pulse 78   Temp 97.9 F (36.6 C) (Oral)   Resp 16   Ht 5\' 9"  (1.753 m)   Wt 86.2 kg (190 lb)   SpO2 100%   BMI 28.06 kg/m   Physical Exam  Constitutional: He is oriented to person, place, and time. He appears well-developed and well-nourished.  HENT:  Head: Normocephalic and atraumatic.  Eyes: Pupils are equal, round, and reactive to light. Conjunctivae and EOM are normal.  Neck: Normal range of motion. No tracheal deviation present.  Cardiovascular: Normal rate and regular rhythm.   No murmur heard. Pulmonary/Chest: Effort normal. No respiratory distress.  Abdominal: Soft. There is no tenderness. There is no  rebound and no guarding.  Musculoskeletal: He exhibits edema. He exhibits no deformity.  Bilateral pitting edema to the lower extremities, symmetric.  Neurological: He is alert and oriented to person, place, and time. No cranial nerve deficit.  Skin: Skin is warm and dry.  Psychiatric: He has a normal mood and affect.  Nursing note and vitals reviewed.    ED Treatments / Results  Labs (all labs ordered are listed, but only abnormal results are displayed) Labs Reviewed  BASIC METABOLIC PANEL - Abnormal; Notable for the following:       Result Value   Potassium 3.4 (*)    All other components within normal limits  CBC - Abnormal; Notable for the following:    Hemoglobin 12.3 (*)    HCT 38.8 (*)    All other components within normal limits  URINALYSIS, ROUTINE  W REFLEX MICROSCOPIC - Abnormal; Notable for the following:    Color, Urine COLORLESS (*)    Specific Gravity, Urine 1.004 (*)    Hgb urine dipstick MODERATE (*)    Bacteria, UA RARE (*)    Squamous Epithelial / LPF 0-5 (*)    All other components within normal limits  URINE CULTURE  BRAIN NATRIURETIC PEPTIDE  I-STAT TROPONIN, ED    EKG  EKG Interpretation  Date/Time:  Friday May 31 2017 17:45:18 EDT Ventricular Rate:  87 PR Interval:  158 QRS Duration: 150 QT Interval:  416 QTC Calculation: 500 R Axis:   91 Text Interpretation:  Normal sinus rhythm Right bundle branch block Abnormal ECG Confirmed by Elnora Morrison [phone]) on 05/31/2017 11:03:36 PM       Radiology Dg Chest 2 View  Result Date: 05/31/2017 CLINICAL DATA:  Bilateral lower extremity swelling worse than usual with cramping and tingling. EXAM: CHEST  2 VIEW COMPARISON:  05/29/2017, 03/16/2017 FINDINGS: The heart size and mediastinal contours are within normal limits. Chronic slight elevation of the left hemidiaphragm laterally with atelectasis at the left lung base versus scarring. No pneumothorax, effusion or CHF. No acute nor suspicious osseous  abnormalities. IMPRESSION: Chronic left basilar atelectasis and/or scarring with chronic slight elevation of the lateral aspect of left hemidiaphragm. Electronically Signed   By: Ashley Royalty M.D.   On: 05/31/2017 19:34    Procedures Procedures (including critical care time)  Medications Ordered in ED Medications  furosemide (LASIX) injection 60 mg (60 mg Intravenous Given 05/31/17 2223)     Initial Impression / Assessment and Plan / ED Course  I have reviewed the triage vital signs and the nursing notes.  Pertinent labs & imaging results that were available during my care of the patient were reviewed by me and considered in my medical decision making (see chart for details).     Patient presenting with mild worsening of chronic leg swelling. Noncompliance with Lasix. No shortness of breath.   Labwork unremarkable including a BNP so do not believe CHF exacerbation. Doubt PE. Swelling is symmetric and chronic; do not believe this is DVT. EKG similar to previous and chest x-ray unremarkable. Labwork unremarkable. Given 60 IV Lasix here and encouraged to take his Lasix at home. Regarding his hallucinations, do not believe there is any indication to involve emergent psychiatry at this time as he denies suicidal and homicidal ideations and there are no auditory hallucinations telling him to hurt himself or any other concerning signs or symptoms. Patient is alert and treated with Seroquel and does report compliance.  Patient with some urine output here. His urinalysis is unremarkable for a UTI. I do believe he is safe for discharge at this time. Encouraged to take his Lasix at home as this will help with the swelling. Also encouraged to follow with primary care on Monday for recheck. He voiced understanding and agreement was comfortable with outpatient management.  Patient was seen with my attending, Dr. Reather Converse, who voiced agreement and oversaw the evaluation and treatment of this patient.    [employer] seismologist was used in the creation of this note. If there are any errors or inconsistencies needing clarification, please contact me directly.     Final Clinical Impressions(s) / ED Diagnoses   Final diagnoses:  Leg swelling    New Prescriptions New Prescriptions   No medications on file     Valda Lamb, MD 06/01/17 Dwan Bolt, MD 06/01/17 2340

## 2017-06-01 LAB — URINALYSIS, ROUTINE W REFLEX MICROSCOPIC
BILIRUBIN URINE: NEGATIVE
Glucose, UA: NEGATIVE mg/dL
Ketones, ur: NEGATIVE mg/dL
Leukocytes, UA: NEGATIVE
NITRITE: NEGATIVE
PH: 6 (ref 5.0–8.0)
Protein, ur: NEGATIVE mg/dL
SPECIFIC GRAVITY, URINE: 1.004 — AB (ref 1.005–1.030)

## 2017-06-01 NOTE — ED Notes (Signed)
Tried calling pt's Nephew times 2. No answer. Nephews voicemail box has not been set up so no voicemail left

## 2017-06-01 NOTE — ED Notes (Addendum)
The patient has been in the lobby since 0130. Came to Nurse first at [place]. Rounded on patient, pt was given beverage. I asked the patient if he needed to use the restroom, pt declined. Offered to call [facility] for patient, pt declined.  Called pt daughter and nephews numbers [phone] with pt permission. Pt nephew answered phone at 0830. States he will be here to pick up his grandfather in 9 minutes. Pt has urine on his paper scrubs, offered to help the patient change into clean scrubs. The patient refused.  This note was typed at 0830 but the epic will not let me type it for this time.

## 2017-06-01 NOTE — ED Notes (Signed)
Pt assisted to the lobby to wait for nephew.

## 2017-06-02 LAB — URINE CULTURE: Culture: <10000 — AB

## 2017-06-06 ENCOUNTER — Inpatient Hospital Stay (HOSPITAL_COMMUNITY)
Admission: EM | Admit: 2017-06-06 | Discharge: 2017-06-11 | DRG: 065 | Disposition: A | Discharge status: Skilled Nursing Facility | Payer: Medicare Other | Attending: Internal Medicine | Admitting: Internal Medicine

## 2017-06-06 ENCOUNTER — Encounter (HOSPITAL_COMMUNITY): Payer: Self-pay | Admitting: Emergency Medicine

## 2017-06-06 ENCOUNTER — Emergency Department (HOSPITAL_COMMUNITY): Payer: Medicare Other

## 2017-06-06 ENCOUNTER — Other Ambulatory Visit: Payer: Self-pay

## 2017-06-06 DIAGNOSIS — K219 Gastro-esophageal reflux disease without esophagitis: Secondary | ICD-10-CM | POA: Diagnosis present

## 2017-06-06 DIAGNOSIS — I6789 Other cerebrovascular disease: Secondary | ICD-10-CM | POA: Diagnosis not present

## 2017-06-06 DIAGNOSIS — R5381 Other malaise: Secondary | ICD-10-CM | POA: Diagnosis not present

## 2017-06-06 DIAGNOSIS — R627 Adult failure to thrive: Secondary | ICD-10-CM | POA: Diagnosis not present

## 2017-06-06 DIAGNOSIS — I10 Essential (primary) hypertension: Secondary | ICD-10-CM | POA: Diagnosis not present

## 2017-06-06 DIAGNOSIS — R410 Disorientation, unspecified: Secondary | ICD-10-CM

## 2017-06-06 DIAGNOSIS — G253 Myoclonus: Secondary | ICD-10-CM | POA: Diagnosis not present

## 2017-06-06 DIAGNOSIS — Z8546 Personal history of malignant neoplasm of prostate: Secondary | ICD-10-CM

## 2017-06-06 DIAGNOSIS — G3184 Mild cognitive impairment, so stated: Secondary | ICD-10-CM | POA: Diagnosis present

## 2017-06-06 DIAGNOSIS — M48061 Spinal stenosis, lumbar region without neurogenic claudication: Secondary | ICD-10-CM | POA: Diagnosis not present

## 2017-06-06 DIAGNOSIS — G629 Polyneuropathy, unspecified: Secondary | ICD-10-CM | POA: Diagnosis present

## 2017-06-06 DIAGNOSIS — R319 Hematuria, unspecified: Secondary | ICD-10-CM | POA: Diagnosis present

## 2017-06-06 DIAGNOSIS — Z79899 Other long term (current) drug therapy: Secondary | ICD-10-CM

## 2017-06-06 DIAGNOSIS — Z79891 Long term (current) use of opiate analgesic: Secondary | ICD-10-CM | POA: Diagnosis not present

## 2017-06-06 DIAGNOSIS — I4510 Unspecified right bundle-branch block: Secondary | ICD-10-CM | POA: Diagnosis present

## 2017-06-06 DIAGNOSIS — Z7409 Other reduced mobility: Secondary | ICD-10-CM | POA: Diagnosis not present

## 2017-06-06 DIAGNOSIS — G248 Other dystonia: Secondary | ICD-10-CM | POA: Diagnosis present

## 2017-06-06 DIAGNOSIS — Z87891 Personal history of nicotine dependence: Secondary | ICD-10-CM

## 2017-06-06 DIAGNOSIS — I160 Hypertensive urgency: Secondary | ICD-10-CM | POA: Diagnosis present

## 2017-06-06 DIAGNOSIS — F29 Unspecified psychosis not due to a substance or known physiological condition: Secondary | ICD-10-CM | POA: Diagnosis present

## 2017-06-06 DIAGNOSIS — R531 Weakness: Secondary | ICD-10-CM

## 2017-06-06 DIAGNOSIS — G249 Dystonia, unspecified: Secondary | ICD-10-CM | POA: Diagnosis present

## 2017-06-06 DIAGNOSIS — E876 Hypokalemia: Secondary | ICD-10-CM | POA: Diagnosis not present

## 2017-06-06 DIAGNOSIS — Y92009 Unspecified place in unspecified non-institutional (private) residence as the place of occurrence of the external cause: Secondary | ICD-10-CM

## 2017-06-06 DIAGNOSIS — W19XXXA Unspecified fall, initial encounter: Secondary | ICD-10-CM | POA: Diagnosis not present

## 2017-06-06 DIAGNOSIS — R7303 Prediabetes: Secondary | ICD-10-CM | POA: Diagnosis not present

## 2017-06-06 DIAGNOSIS — I639 Cerebral infarction, unspecified: Secondary | ICD-10-CM | POA: Diagnosis not present

## 2017-06-06 DIAGNOSIS — I6330 Cerebral infarction due to thrombosis of unspecified cerebral artery: Secondary | ICD-10-CM | POA: Diagnosis not present

## 2017-06-06 DIAGNOSIS — Z9079 Acquired absence of other genital organ(s): Secondary | ICD-10-CM

## 2017-06-06 DIAGNOSIS — S0990XA Unspecified injury of head, initial encounter: Secondary | ICD-10-CM | POA: Diagnosis not present

## 2017-06-06 DIAGNOSIS — E669 Obesity, unspecified: Secondary | ICD-10-CM | POA: Diagnosis present

## 2017-06-06 DIAGNOSIS — R296 Repeated falls: Secondary | ICD-10-CM | POA: Diagnosis not present

## 2017-06-06 DIAGNOSIS — W19XXXD Unspecified fall, subsequent encounter: Secondary | ICD-10-CM | POA: Diagnosis not present

## 2017-06-06 DIAGNOSIS — G9341 Metabolic encephalopathy: Secondary | ICD-10-CM | POA: Diagnosis present

## 2017-06-06 DIAGNOSIS — Z6828 Body mass index (BMI) 28.0-28.9, adult: Secondary | ICD-10-CM

## 2017-06-06 HISTORY — DX: Rhabdomyolysis: M62.82

## 2017-06-06 LAB — I-STAT CHEM 8, ED
BUN: 11 mg/dL (ref 6–20)
CALCIUM ION: 1.11 mmol/L — AB (ref 1.15–1.40)
Chloride: 96 mmol/L — ABNORMAL LOW (ref 101–111)
Creatinine, Ser: 0.9 mg/dL (ref 0.61–1.24)
Glucose, Bld: 101 mg/dL — ABNORMAL HIGH (ref 65–99)
HEMATOCRIT: 45 % (ref 39.0–52.0)
HEMOGLOBIN: 15.3 g/dL (ref 13.0–17.0)
Potassium: 3.2 mmol/L — ABNORMAL LOW (ref 3.5–5.1)
SODIUM: 141 mmol/L (ref 135–145)
TCO2: 31 mmol/L (ref 0–100)

## 2017-06-06 LAB — CBC
HEMATOCRIT: 42.7 % (ref 39.0–52.0)
Hemoglobin: 13.5 g/dL (ref 13.0–17.0)
MCH: 26.7 pg (ref 26.0–34.0)
MCHC: 31.6 g/dL (ref 30.0–36.0)
MCV: 84.6 fL (ref 78.0–100.0)
Platelets: 216 10*3/uL (ref 150–400)
RBC: 5.05 MIL/uL (ref 4.22–5.81)
RDW: 15 % (ref 11.5–15.5)
WBC: 6.2 10*3/uL (ref 4.0–10.5)

## 2017-06-06 LAB — COMPREHENSIVE METABOLIC PANEL
ALT: 28 U/L (ref 17–63)
AST: 95 U/L — ABNORMAL HIGH (ref 15–41)
Albumin: 3.6 g/dL (ref 3.5–5.0)
Alkaline Phosphatase: 57 U/L (ref 38–126)
Anion gap: 10 (ref 5–15)
BILIRUBIN TOTAL: 1.3 mg/dL — AB (ref 0.3–1.2)
BUN: 10 mg/dL (ref 6–20)
CALCIUM: 9.1 mg/dL (ref 8.9–10.3)
CHLORIDE: 99 mmol/L — AB (ref 101–111)
CO2: 31 mmol/L (ref 22–32)
CREATININE: 1.05 mg/dL (ref 0.61–1.24)
Glucose, Bld: 101 mg/dL — ABNORMAL HIGH (ref 65–99)
Potassium: 3.2 mmol/L — ABNORMAL LOW (ref 3.5–5.1)
Sodium: 140 mmol/L (ref 135–145)
TOTAL PROTEIN: 6.7 g/dL (ref 6.5–8.1)

## 2017-06-06 LAB — URINALYSIS, ROUTINE W REFLEX MICROSCOPIC
BILIRUBIN URINE: NEGATIVE
GLUCOSE, UA: NEGATIVE mg/dL
KETONES UR: NEGATIVE mg/dL
LEUKOCYTES UA: NEGATIVE
NITRITE: NEGATIVE
PROTEIN: 30 mg/dL — AB
Specific Gravity, Urine: 1.024 (ref 1.005–1.030)
Squamous Epithelial / LPF: NONE SEEN
pH: 5 (ref 5.0–8.0)

## 2017-06-06 LAB — DIFFERENTIAL
BASOS PCT: 0 %
Basophils Absolute: 0 10*3/uL (ref 0.0–0.1)
Eosinophils Absolute: 0.1 10*3/uL (ref 0.0–0.7)
Eosinophils Relative: 1 %
Lymphocytes Relative: 20 %
Lymphs Abs: 1.3 10*3/uL (ref 0.7–4.0)
MONO ABS: 0.5 10*3/uL (ref 0.1–1.0)
MONOS PCT: 8 %
NEUTROS ABS: 4.4 10*3/uL (ref 1.7–7.7)
Neutrophils Relative %: 71 %

## 2017-06-06 LAB — APTT: aPTT: 37 seconds — ABNORMAL HIGH (ref 24–36)

## 2017-06-06 LAB — CBG MONITORING, ED: Glucose-Capillary: 91 mg/dL (ref 65–99)

## 2017-06-06 LAB — PROTIME-INR
INR: 1.28
Prothrombin Time: 16.1 seconds — ABNORMAL HIGH (ref 11.4–15.2)

## 2017-06-06 LAB — RAPID URINE DRUG SCREEN, HOSP PERFORMED
Amphetamines: NOT DETECTED
BENZODIAZEPINES: NOT DETECTED
Barbiturates: NOT DETECTED
Cocaine: NOT DETECTED
Opiates: NOT DETECTED
Tetrahydrocannabinol: NOT DETECTED

## 2017-06-06 LAB — I-STAT TROPONIN, ED: TROPONIN I, POC: 0 ng/mL (ref 0.00–0.08)

## 2017-06-06 LAB — ETHANOL

## 2017-06-06 MED ORDER — SENNOSIDES-DOCUSATE SODIUM 8.6-50 MG PO TABS
1.0000 | ORAL_TABLET | Freq: Every evening | ORAL | Status: DC | PRN
Start: 2017-06-06 — End: 2017-06-09

## 2017-06-06 MED ORDER — ACETAMINOPHEN 160 MG/5ML PO SOLN: 650.0000 mg | ORAL | Status: DC | PRN

## 2017-06-06 MED ORDER — QUETIAPINE FUMARATE 50 MG PO TABS
50.0000 mg | ORAL_TABLET | Freq: Every day | ORAL | Status: DC
  Administered 2017-06-06 – 2017-06-10 (×5): 50 mg via ORAL
  Filled 2017-06-06: qty 1
  Filled 2017-06-06: qty 2
  Filled 2017-06-06 (×3): qty 1

## 2017-06-06 MED ORDER — ACETAMINOPHEN 650 MG RE SUPP: 650.0000 mg | RECTAL | Status: DC | PRN

## 2017-06-06 MED ORDER — ASPIRIN 300 MG RE SUPP: 300.0000 mg | Freq: Every day | RECTAL | Status: DC

## 2017-06-06 MED ORDER — ACETAMINOPHEN 325 MG PO TABS
650.0000 mg | ORAL_TABLET | ORAL | Status: DC | PRN
  Administered 2017-06-08: 650 mg via ORAL
  Filled 2017-06-06: qty 2

## 2017-06-06 MED ORDER — STROKE: EARLY STAGES OF RECOVERY BOOK
Freq: Once | Status: AC
  Administered 2017-06-06: 21:00:00
  Filled 2017-06-06: qty 1

## 2017-06-06 MED ORDER — ENOXAPARIN SODIUM 40 MG/0.4ML ~~LOC~~ SOLN
40.0000 mg | SUBCUTANEOUS | Status: DC
  Administered 2017-06-06 – 2017-06-10 (×5): 40 mg via SUBCUTANEOUS
  Filled 2017-06-06 (×5): qty 0.4

## 2017-06-06 MED ORDER — ONDANSETRON HCL 4 MG/2ML IJ SOLN: 4.0000 mg | Freq: Four times a day (QID) | INTRAMUSCULAR | Status: DC | PRN

## 2017-06-06 MED ORDER — AMLODIPINE BESYLATE 5 MG PO TABS
5.0000 mg | ORAL_TABLET | Freq: Every day | ORAL | Status: DC
  Administered 2017-06-06: 5 mg via ORAL

## 2017-06-06 MED ORDER — POTASSIUM CHLORIDE IN NACL 40-0.9 MEQ/L-% IV SOLN
INTRAVENOUS | Status: DC
  Administered 2017-06-06 – 2017-06-09 (×3): 100 mL/h via INTRAVENOUS
  Filled 2017-06-06 (×4): qty 1000

## 2017-06-06 MED ORDER — ASPIRIN 325 MG PO TABS
325.0000 mg | ORAL_TABLET | Freq: Every day | ORAL | Status: DC
  Administered 2017-06-06 – 2017-06-11 (×6): 325 mg via ORAL
  Filled 2017-06-06 (×6): qty 1

## 2017-06-06 NOTE — H&P (Addendum)
History and Physical:    Evan Charles   OVZ:[id number] DOB: Jul 07, 1946 DOA: 06/06/2017  Referring MD/provider: Dr. Canary Brim PCP: [facility]   Patient coming from:   Chief Complaint: 2-3 day h/o left sided weakness following a fall.  History of Present Illness:   Evan Charles is an 71 y.o. 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 During that admission, he was treated for Citrobacter UTI and was discharged home. He was brought to the hospital again today with a chief complaint of left-sided weakness after suffering from a fall at home several days ago.The patient is clearly confused when I try to ascertain a history from him. He is unaware that he is at the hospital, thinks he is at social services. Cannot give me any details about falling recently or his current situation. It is very difficult to understand his speech.  ED Course:  The patient had a CT which showed mild atrophic and chronic white matter ischemic changes with no acute findings. EKG was similar to baseline with no ischemic changes. Chest x-ray showed chronic left basilar atelectasis and/or scarring with chronic slight elevation of the lateral aspect of the left hemidiaphragm. Urinalysis was negative for nitrites and leukocytes. UDS was negative. Chemistry significant for BUN 10, creatinine 1.05, potassium 3.2. WBC 6.2.  ROS:   Review of Systems  Unable to perform ROS: Mental status change    Past Medical History:   Past Medical History:  Diagnosis Date  . Back pain   .  Bilateral lower extremity edema 04/2017  . Chronic mental illness   . Colitis   . Dementia   . GERD (gastroesophageal reflux disease)   . Hypertension   . Neuropathy    " MY HANDS "  . Prostate cancer ([place])   . Rhabdomyolysis 09/08/2016  . UTI (urinary tract infection) 04/2017    Past Surgical History:   Past Surgical History:  Procedure Laterality Date  . KNEE ARTHROSCOPY    . LAMINECTOMY    . PROSTATECTOMY      Social History:   Social History   Social History  . Marital status: Single    Spouse name: N/A  . Number of children: N/A  . Years of education: N/A   Occupational History  . Retired    Social History Main Topics  . Smoking status: Former Smoker    Types: Cigarettes  . Smokeless tobacco: Never Used  . Alcohol use No  . Drug use: No  . Sexual activity: No   Other Topics Concern  . Not on file   Social History Narrative   Cares for self at home. Has friends to help and uses meals on wheels.     Allergies   Patient has no known allergies.  Family history:   Family History  Problem Relation Age of Onset  . Cancer Brother   . Cancer Maternal Grandmother     Current Medications:   Prior to Admission medications   Medication Sig Start Date End Date Taking? Authorizing Provider  amLODipine (NORVASC) 5 MG tablet Take 1 tablet (5 mg total) by mouth  daily. 04/30/17  Yes Zada Finders, MD  furosemide (LASIX) 80 MG tablet Take 1 tablet (80 mg total) by mouth 2 (two) times daily. For 5 days then return to once daily. Patient taking differently: Take 80 mg by mouth daily as needed for fluid. For 5 days then return to once daily. 05/29/17  Yes Mesner, Corene Cornea, MD  hydrochlorothiazide (MICROZIDE) 12.5 MG capsule Take 1 capsule (12.5 mg total) by mouth daily. 04/30/17  Yes Zada Finders, MD  QUEtiapine (SEROQUEL) 50 MG tablet Take 1-2 tablets (50-100 mg total) by mouth at bedtime. 05/29/17  Yes Mesner, Corene Cornea, MD  traMADol (ULTRAM) 50 MG tablet Take 1 tablet  (50 mg total) by mouth every 6 (six) hours as needed. Patient taking differently: Take 50 mg by mouth every 6 (six) hours as needed for moderate pain.  05/29/17  Yes Mesner, Corene Cornea, MD  docusate sodium (COLACE) 100 MG capsule Take 1 capsule (100 mg total) by mouth 2 (two) times daily as needed for moderate constipation. Patient not taking: Reported on 06/06/2017 04/30/17   Zada Finders, MD  pantoprazole (PROTONIX) 40 MG tablet Take 1 tablet (40 mg total) by mouth 2 (two) times daily. Patient not taking: Reported on 06/06/2017 12/16/15   Leo Grosser, MD    Physical Exam:   Vitals:   06/06/17 1306 06/06/17 1307 06/06/17 1330 06/06/17 1630  BP: 131/86  (!) 147/115 135/85  Pulse: 92  91 86  Resp: 12  16 16   Temp:      TempSrc:      SpO2: 94%  93% 98%  Weight:  86.2 kg (190 lb)    Height:  5\' 9"  (1.753 m)       Physical Exam: Blood pressure 135/85, pulse 86, temperature 97.7 F (36.5 C), temperature source Oral, resp. rate 16, height 5\' 9"  (1.753 m), weight 86.2 kg (190 lb), SpO2 98 %. Gen: No acute distress. Lethargic appearing and needed tactile stimulation to awaken. Head: Normocephalic, atraumatic. Eyes: Pupils equal, round and reactive to light. Extraocular movements intact.  Sclerae nonicteric. No lid lag. Mouth: Oropharynx reveals dry mucous membranes with white coating to the tongue. Dentition is fair. Neck: Supple, no thyromegaly, no lymphadenopathy, no jugular venous distention. Chest: Lungs are diminished to auscultation with fair air movement, poor effort. Occasional rhonchi. CV: Heart sounds are regular with an S1, S2. No murmurs, rubs, clicks, or gallops.  Abdomen: Soft, nontender, nondistended with normal active bowel sounds. No hepatosplenomegaly or palpable masses. Extremities: Extremities are without clubbing, or cyanosis. 2+ edema. Pedal pulses 1+. Skin: Warm and dry. No rashes, lesions or wounds. Neuro: Alert and oriented to self only. Thinks he is at social services,  and that the month is May. Could not tell me who the president is but was able to pick "Trump" out when I gave him several options. Questionable mild left sided facial droop. Uvula deviates to the left but tongue appears midline. Moves all extremities against gravity, but has generalized weakness in all muscle groups. Psych: Insight and judgment is poor. Mood and affect flat.   Data Review:    Labs: Basic Metabolic Panel:  Recent Labs Lab 05/31/17 1742 06/06/17 1358 06/06/17 1430  NA 139 140 141  K 3.4* 3.2* 3.2*  CL 106 99* 96*  CO2 23 31  --   GLUCOSE 99 101* 101*  BUN 14 10 11   CREATININE 0.95 1.05 0.90  CALCIUM 9.0 9.1  --    Liver Function Tests:  Recent Labs Lab 06/06/17 1358  AST 95*  ALT 28  ALKPHOS 57  BILITOT 1.3*  PROT 6.7  ALBUMIN 3.6   CBC:  Recent Labs Lab 05/31/17 1742 06/06/17 1358 06/06/17 1430  WBC 4.3 6.2  --   NEUTROABS  --  4.4  --   HGB 12.3* 13.5 15.3  HCT 38.8* 42.7 45.0  MCV 84.9 84.6  --   PLT 223 216  --    Urinalysis    Component Value Date/Time   COLORURINE YELLOW 06/06/2017 1628   APPEARANCEUR CLEAR 06/06/2017 1628   LABSPEC 1.024 06/06/2017 1628   PHURINE 5.0 06/06/2017 1628   GLUCOSEU NEGATIVE 06/06/2017 1628   HGBUR MODERATE (A) 06/06/2017 1628   BILIRUBINUR NEGATIVE 06/06/2017 1628   KETONESUR NEGATIVE 06/06/2017 1628   PROTEINUR 30 (A) 06/06/2017 1628   UROBILINOGEN 0.2 02/13/2015 1957   NITRITE NEGATIVE 06/06/2017 1628   LEUKOCYTESUR NEGATIVE 06/06/2017 1628      Radiographic Studies: CT of the head is personally reviewed and is as pictured below. Mild atrophy and chronic white matter ischemic changes are evident. Otherwise, no acute findings.    Chest x-ray personally reviewed and is as pictured below. Left hemidiaphragm is elevated. No evidence of CHF or pneumonia.   EKG: Independently reviewed. RBBB and LPFB without change from prior. Ventricular rate 93 bpm with a QTC of 498.   Assessment/Plan:    Principal Problem:   Failure to thrive in adult with physical deconditioning, altered mental status, fall at home, generalized weakness rule out acute CVA in a patient with microvascular disease on imaging Full stroke workup initiated. Neurologist consulted by EDP. Neuro exam underwhelming, but suggestion of possible left-sided facial weakness appreciated. Weakness appears to be more generalized in the limbs.  Active Problems:   Mild cognitive impairment Appears to have capacity per prior psychiatric evaluation despite the fact that it is clear that he cannot adequately care for himself at home. Social worker will need to be consulted to evaluate home situation.    Hypokalemia Potassium added to IV fluids.    Impaired mobility and ADLs/Spinal stenosis of lumbar region without neurogenic claudication/Weakness PT/OT evaluations requested.    Hypertensive urgency Patient does have significant hypertension, and may have hypertensive encephalopathy. Would allow some permissive hypertension until stroke ruled out. Continue Norvasc, but hold diuretic therapy for now. Patient appears dry clinically.   Other information:   DVT prophylaxis: Lovenox ordered. Code Status: Partial code code. Family Communication: No family present. Spouse listed as emergency contact, but no answer at # listed.  Disposition Plan: Unclear, has refused SNF placement in past, but unable to adequately care for himself. Consults called: Neurology called by EDP. Admission status: Observation for now.  Will need full admission if stroke work up +.  The medical decision making on this patient was of high complexity and the patient is at high risk for clinical deterioration, therefore this is a level 3 visit.    RAMA,CHRISTINA [facility] Pager [phone] Cell: [phone]   If 7PM-7AM, please contact night-coverage [email or web address] Password [email or web address] 06/06/2017, 5:50 PM

## 2017-06-06 NOTE — ED Provider Notes (Signed)
[facility] DEPT Provider Note   CSN: [id number] Arrival date & time: 06/06/17  1300     History   Chief Complaint Chief Complaint  Patient presents with  . stroke symptoms    3 days ago; L side weakness  . Fall    HPI Evan Charles is a 71 y.o. male.  HPI  A LEVEL 5 CAVEAT PERTAINS DUE TO DEMENTIA.  Pt presenting due to left arm and left leg weakness- worse than his baseline.  He has hx of dementia/hallucinations, spinal stenosis with mobility issues at baseline.  States that he had a fall approx 3 days ago and since that time his left hand and left leg have been more weak.  He states he "can't walk right" and is dropping things when he tries to pick them up with his left hand.    Past Medical History:  Diagnosis Date  . Back pain   . Bilateral lower extremity edema 04/2017  . Chronic mental illness   . Colitis   . Dementia   . GERD (gastroesophageal reflux disease)   . Hypertension   . Neuropathy    " MY HANDS "  . Prostate cancer ([place])   . UTI (urinary tract infection) 04/2017    Patient Active Problem List   Diagnosis Date Noted  . UTI (urinary tract infection) 05/02/2017  . Leg weakness 04/28/2017  . 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 Impaired mobility and ADLs   . Hypokalemia 11/05/2016  . Dementia with psychosis 09/12/2016  . Acute delirium 09/08/2016  . Rhabdomyolysis 09/08/2016  . Normocytic anemia 09/08/2016  . Altered mental status 09/08/2016  . Fall at home   . UTI (lower urinary tract infection) 07/25/2016  . Mild cognitive impairment 07/25/2016  . 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 Immobility 03/20/2016  . Generalized weakness 03/20/2016  . Failure to thrive in adult 03/20/2016  . Neuropathy   . Essential hypertension   . Physical deconditioning   . 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 GERD without esophagitis 12/19/2015  . Bilateral lower extremity edema  12/19/2015  . Intractable low back pain 02/14/2015  . Difficulty walking 02/14/2015  . H/O: upper GI bleed   . Back pain at L4-L5 level   . Visual hallucinations   . Psychoses     Past Surgical History:  Procedure Laterality Date  . KNEE ARTHROSCOPY    . LAMINECTOMY    . PROSTATECTOMY         Home Medications    Prior to Admission medications   Medication Sig Start Date End Date Taking? Authorizing Provider  amLODipine (NORVASC) 5 MG tablet Take 1 tablet (5 mg total) by mouth daily. 04/30/17  Yes Zada Finders, MD  furosemide (LASIX) 80 MG tablet Take 1 tablet (80 mg total) by mouth 2 (two) times daily. For 5 days then return to once daily. Patient taking differently: Take 80 mg by mouth daily as needed for fluid. For 5 days then return to once daily. 05/29/17  Yes Mesner, Corene Cornea, MD  hydrochlorothiazide (MICROZIDE) 12.5 MG capsule Take 1 capsule (12.5 mg total) by mouth daily. 04/30/17  Yes Zada Finders, MD  QUEtiapine (SEROQUEL) 50 MG tablet Take 1-2 tablets (50-100 mg total) by mouth at bedtime. 05/29/17  Yes Mesner, Corene Cornea, MD  traMADol (ULTRAM) 50 MG tablet Take 1 tablet (50 mg total) by mouth every 6 (six) hours as needed. Patient taking differently: Take 50 mg by mouth every  6 (six) hours as needed for moderate pain.  05/29/17  Yes Mesner, Corene Cornea, MD  docusate sodium (COLACE) 100 MG capsule Take 1 capsule (100 mg total) by mouth 2 (two) times daily as needed for moderate constipation. Patient not taking: Reported on 06/06/2017 04/30/17   Zada Finders, MD  pantoprazole (PROTONIX) 40 MG tablet Take 1 tablet (40 mg total) by mouth 2 (two) times daily. Patient not taking: Reported on 06/06/2017 12/16/15   Leo Grosser, MD    Family History Family History  Problem Relation Age of Onset  . Cancer Brother   . Cancer Maternal Grandmother     Social History Social History  Substance Use Topics  . Smoking status: Former Smoker    Types: Cigarettes  . Smokeless tobacco: Never Used    . Alcohol use No     Allergies   Patient has no known allergies.   Review of Systems Review of Systems  UNABLE TO OBTAIN ROS DUE TO LEVEL 5 CAVEAT   Physical Exam Updated Vital Signs BP 135/85   Pulse 86   Temp 97.7 F (36.5 C) (Oral)   Resp 16   Ht 5\' 9"  (1.753 m)   Wt 86.2 kg (190 lb)   SpO2 98%   BMI 28.06 kg/m  Vitals reviewed Physical Exam Physical Examination: General appearance - alert, chronically ill and somewhat disheveled appearing, and in no distress Mental status - alert, oriented to person, place, not to time Eyes - no conjunctival injection, no scleral icterus Chest - clear to auscultation, no wheezes, rales or rhonchi, symmetric air entry Heart - normal rate, regular rhythm, normal S1, S2, no murmurs, rubs, clicks or gallops Abdomen - soft, nontender, nondistended, no masses or organomegaly Neurological - alert, oriented x 2,, left upper and lower extremity with 3/5 strength, 4/5 strength on right side.  No appreciable facial droop, some slurring of words but unclear if this is acute or chronic speech pattern Extremities - peripheral pulses normal, no pedal edema, no clubbing or cyanosis Skin - normal coloration and turgor, no rashes  ED Treatments / Results  Labs (all labs ordered are listed, but only abnormal results are displayed) Labs Reviewed  PROTIME-INR - Abnormal; Notable for the following:       Result Value   Prothrombin Time 16.1 (*)    All other components within normal limits  APTT - Abnormal; Notable for the following:    aPTT 37 (*)    All other components within normal limits  COMPREHENSIVE METABOLIC PANEL - Abnormal; Notable for the following:    Potassium 3.2 (*)    Chloride 99 (*)    Glucose, Bld 101 (*)    AST 95 (*)    Total Bilirubin 1.3 (*)    All other components within normal limits  URINALYSIS, ROUTINE W REFLEX MICROSCOPIC - Abnormal; Notable for the following:    Hgb urine dipstick MODERATE (*)    Protein, ur 30 (*)     Bacteria, UA RARE (*)    All other components within normal limits  I-STAT CHEM 8, ED - Abnormal; Notable for the following:    Potassium 3.2 (*)    Chloride 96 (*)    Glucose, Bld 101 (*)    Calcium, Ion 1.11 (*)    All other components within normal limits  ETHANOL  CBC  DIFFERENTIAL  RAPID URINE DRUG SCREEN, HOSP PERFORMED  I-STAT TROPONIN, ED    EKG  EKG Interpretation  Date/Time:  Thursday June 06 2017 13:06:47  EDT Ventricular Rate:  93 PR Interval:    QRS Duration: 161 QT Interval:  400 QTC Calculation: 498 R Axis:   115 Text Interpretation:  Sinus rhythm RBBB and LPFB No significant change since last tracing Confirmed by Alfonzo Beers [phone]) on 06/06/2017 2:20:08 PM       Radiology Ct Head Wo Contrast  Result Date: 06/06/2017 CLINICAL DATA:  Left-sided weakness EXAM: CT HEAD WITHOUT CONTRAST TECHNIQUE: Contiguous axial images were obtained from the base of the skull through the vertex without intravenous contrast. COMPARISON:  05/02/2017 FINDINGS: Brain: Mild atrophic and chronic white matter ischemic changes are seen. No findings to suggest acute hemorrhage, acute infarction or space-occupying mass lesion are noted. Vascular: No hyperdense vessel or unexpected calcification. Skull: Normal. Negative for fracture or focal lesion. Sinuses/Orbits: No acute finding. Other: None. IMPRESSION: Chronic atrophic and ischemic changes without acute abnormality. Electronically Signed   By: Inez Catalina M.D.   On: 06/06/2017 14:24    Procedures Procedures (including critical care time)  Medications Ordered in ED Medications - No data to display   Initial Impression / Assessment and Plan / ED Course  I have reviewed the triage vital signs and the nursing notes.  Pertinent labs & imaging results that were available during my care of the patient were reviewed by me and considered in my medical decision making (see chart for details).    4:36 PM d/w neurology, Dr. Rory Percy-  they will consult on patient.   4:53 PM  D/w [facility], Dr. Rockne Menghini for admission.  She will see patient and admit.    Pt with hx of dementia/spinal stenosis presenting with left upper and lower extrmeity weakness which began approx 3 days ago.  intiial workup is reassuring, head CT shows chronic findings only.  Labs show mild hypokalemia.  Awaiting urinalysis.  D/w neurology and [facility] as noted above.    Final Clinical Impressions(s) / ED Diagnoses   Final diagnoses:  Left-sided weakness  Fall, initial encounter    New Prescriptions New Prescriptions   No medications on file     Pixie Casino, MD 06/06/17 1724

## 2017-06-06 NOTE — ED Notes (Signed)
Catheter bag checked and no urine output was noted.

## 2017-06-06 NOTE — Consult Note (Signed)
Referring Physician: Dr. Rockne Menghini    Chief Complaint: 3 day history of left sided weakness  HPI: Evan Charles is an 71 y.o. male with a history of spinal stenosis and dementia, who presented to the ED today with a 3 day history of left upper and lower extremity weakness. He has also had 2 falls over the past 3 days, without injuries noted by family or EMS. He stated that he "can't walk right" and was dropping things when he tried to pick them up with his left hand. He has a history of seizures and dementia. The subtype of the dementia has not been diagnosed. Of note, he has waxing and waning levels of cognition with "good days and bad days" per his relative, who is present at the bedside. He also has formed visual hallucinations of people, which has become less responsive to antipsychotics over time. His relative is unsure if the patient thrashes around in his sleep or acts out his dreams. The patient has had shuffling gait and slow movements which have progressed gradually over the last 2 years - his relative had less contact with the patient before then and is unsure when the dementia or movement problems first started. BP 182/107.   Head CT obtained in the ED revealed no acute abnormality. Chronic atrophic and ischemic changes are noted.  Past Medical History:  Diagnosis Date  . Back pain   . Bilateral lower extremity edema 04/2017  . Chronic mental illness   . Colitis   . Dementia   . GERD (gastroesophageal reflux disease)   . Hypertension   . Neuropathy    " MY HANDS "  . Prostate cancer ([place])   . Rhabdomyolysis 09/08/2016  . UTI (urinary tract infection) 04/2017    Past Surgical History:  Procedure Laterality Date  . KNEE ARTHROSCOPY    . LAMINECTOMY    . PROSTATECTOMY      Family History  Problem Relation Age of Onset  . Cancer Brother   . Cancer Maternal Grandmother    Social History:  reports that he has quit smoking. His smoking use included Cigarettes. He has never used  smokeless tobacco. He reports that he does not drink alcohol or use drugs.  Allergies: No Known Allergies  Medications:  Prior to Admission:  Prescriptions Prior to Admission  Medication Sig Dispense Refill Last Dose  . amLODipine (NORVASC) 5 MG tablet Take 1 tablet (5 mg total) by mouth daily. 30 tablet 1 Past Week at Unknown time  . furosemide (LASIX) 80 MG tablet Take 1 tablet (80 mg total) by mouth 2 (two) times daily. For 5 days then return to once daily. (Patient taking differently: Take 80 mg by mouth daily as needed for fluid. For 5 days then return to once daily.) 28 tablet 0 Past Week at Unknown time  . hydrochlorothiazide (MICROZIDE) 12.5 MG capsule Take 1 capsule (12.5 mg total) by mouth daily. 30 capsule 1 Past Week at Unknown time  . QUEtiapine (SEROQUEL) 50 MG tablet Take 1-2 tablets (50-100 mg total) by mouth at bedtime. 30 tablet 0 06/05/2017 at Unknown time  . traMADol (ULTRAM) 50 MG tablet Take 1 tablet (50 mg total) by mouth every 6 (six) hours as needed. (Patient taking differently: Take 50 mg by mouth every 6 (six) hours as needed for moderate pain. ) 15 tablet 0 06/05/2017 at Unknown time  . docusate sodium (COLACE) 100 MG capsule Take 1 capsule (100 mg total) by mouth 2 (two) times daily as  needed for moderate constipation. (Patient not taking: Reported on 06/06/2017)   Not Taking at Unknown time  . pantoprazole (PROTONIX) 40 MG tablet Take 1 tablet (40 mg total) by mouth 2 (two) times daily. (Patient not taking: Reported on 06/06/2017) 60 tablet 0 Not Taking at Unknown time     ROS: Unable to obtain definitive ROS due to AMS with drowsiness.   Physical Examination: Blood pressure 135/85, pulse 86, temperature 97.7 F (36.5 C), temperature source Oral, resp. rate 16, height '5\' 9"'$  (1.753 m), weight 86.2 kg (190 lb), SpO2 98 %.  HEENT: /AT Lungs: Respirations unlabored Ext: Warm and well perfused  Neurologic Examination: Mental Status: Initially somnolent, the patient  is arousable to a drowsy state. Poor orientation. Poor insight. Poor attention. Poverty of thought. Limited speech output is fluent. Able to name common objects. Able to follow most simple commands.  Cranial Nerves: II:  Visual fields grossly normal, PERRL III,IV, VI: ptosis not present, EOMI without nystagmus V,VII: smile symmetric, reacts to facial light touch bilaterally VIII: hearing intact to questions and commands IX,X: no hypophonia XI: Preferentially rotates head to left XII: midline tongue extension  Motor: Increased tone x 4, most consistent with paratonia. Also with mild cogwheel rigidity bilateral upper extremities RUE: 5/5 RLE: 4/5 in the context of poor compliance with exam LUE: 4/5 LLE: 3-4/5 in the context of poor compliance with exam Upper and lower extremity low amplitude myoclonus noted intermittently during the examination and is seen to involve all 4 limbs at various points during the exam; observed to occur in individual limbs as well as more than one limb simultaneously.  Sensory: Light touch grossly intact all 4 extremities without extinction Deep Tendon Reflexes:  Hypoactive x 4.  Plantars: Right: equivocal  Left: upgoing Cerebellar: Slow FNF bilaterally without ataxia Gait: Deferred  Results for orders placed or performed during the hospital encounter of 06/06/17 (from the past 48 hour(s))  Ethanol     Status: None   Collection Time: 06/06/17  1:58 PM  Result Value Ref Range   Alcohol, Ethyl (B) <5 <5 mg/dL    Comment:        LOWEST DETECTABLE LIMIT FOR SERUM ALCOHOL IS 5 mg/dL FOR MEDICAL PURPOSES ONLY   Protime-INR     Status: Abnormal   Collection Time: 06/06/17  1:58 PM  Result Value Ref Range   Prothrombin Time 16.1 (H) 11.4 - 15.2 seconds   INR 1.28   APTT     Status: Abnormal   Collection Time: 06/06/17  1:58 PM  Result Value Ref Range   aPTT 37 (H) 24 - 36 seconds    Comment:        IF BASELINE aPTT IS ELEVATED, SUGGEST PATIENT RISK  ASSESSMENT BE USED TO DETERMINE APPROPRIATE ANTICOAGULANT THERAPY.   CBC     Status: None   Collection Time: 06/06/17  1:58 PM  Result Value Ref Range   WBC 6.2 4.0 - 10.5 K/uL   RBC 5.05 4.22 - 5.81 MIL/uL   Hemoglobin 13.5 13.0 - 17.0 g/dL   HCT 42.7 39.0 - 52.0 %   MCV 84.6 78.0 - 100.0 fL   MCH 26.7 26.0 - 34.0 pg   MCHC 31.6 30.0 - 36.0 g/dL   RDW 15.0 11.5 - 15.5 %   Platelets 216 150 - 400 K/uL  Differential     Status: None   Collection Time: 06/06/17  1:58 PM  Result Value Ref Range   Neutrophils Relative % 71 %  Neutro Abs 4.4 1.7 - 7.7 K/uL   Lymphocytes Relative 20 %   Lymphs Abs 1.3 0.7 - 4.0 K/uL   Monocytes Relative 8 %   Monocytes Absolute 0.5 0.1 - 1.0 K/uL   Eosinophils Relative 1 %   Eosinophils Absolute 0.1 0.0 - 0.7 K/uL   Basophils Relative 0 %   Basophils Absolute 0.0 0.0 - 0.1 K/uL  Comprehensive metabolic panel     Status: Abnormal   Collection Time: 06/06/17  1:58 PM  Result Value Ref Range   Sodium 140 135 - 145 mmol/L   Potassium 3.2 (L) 3.5 - 5.1 mmol/L   Chloride 99 (L) 101 - 111 mmol/L   CO2 31 22 - 32 mmol/L   Glucose, Bld 101 (H) 65 - 99 mg/dL   BUN 10 6 - 20 mg/dL   Creatinine, Ser 1.05 0.61 - 1.24 mg/dL   Calcium 9.1 8.9 - 10.3 mg/dL   Total Protein 6.7 6.5 - 8.1 g/dL   Albumin 3.6 3.5 - 5.0 g/dL   AST 95 (H) 15 - 41 U/L   ALT 28 17 - 63 U/L   Alkaline Phosphatase 57 38 - 126 U/L   Total Bilirubin 1.3 (H) 0.3 - 1.2 mg/dL   GFR calc non Af Amer >60 >60 mL/min   GFR calc Af Amer >60 >60 mL/min    Comment: (NOTE) The eGFR has been calculated using the CKD EPI equation. This calculation has not been validated in all clinical situations. eGFR's persistently <60 mL/min signify possible Chronic Kidney Disease.    Anion gap 10 5 - 15  I-stat troponin, ED (not at [facility], [facility])     Status: None   Collection Time: 06/06/17  2:29 PM  Result Value Ref Range   Troponin i, poc 0.00 0.00 - 0.08 ng/mL   Comment 3            Comment: Due to  the release kinetics of cTnI, a negative result within the first hours of the onset of symptoms does not rule out myocardial infarction with certainty. If myocardial infarction is still suspected, repeat the test at appropriate intervals.   I-Stat Chem 8, ED  (not at [facility], [facility])     Status: Abnormal   Collection Time: 06/06/17  2:30 PM  Result Value Ref Range   Sodium 141 135 - 145 mmol/L   Potassium 3.2 (L) 3.5 - 5.1 mmol/L   Chloride 96 (L) 101 - 111 mmol/L   BUN 11 6 - 20 mg/dL   Creatinine, Ser 0.90 0.61 - 1.24 mg/dL   Glucose, Bld 101 (H) 65 - 99 mg/dL   Calcium, Ion 1.11 (L) 1.15 - 1.40 mmol/L   TCO2 31 0 - 100 mmol/L   Hemoglobin 15.3 13.0 - 17.0 g/dL   HCT 45.0 39.0 - 52.0 %  Urine rapid drug screen (hosp performed)not at [facility]     Status: None   Collection Time: 06/06/17  4:28 PM  Result Value Ref Range   Opiates NONE DETECTED NONE DETECTED   Cocaine NONE DETECTED NONE DETECTED   Benzodiazepines NONE DETECTED NONE DETECTED   Amphetamines NONE DETECTED NONE DETECTED   Tetrahydrocannabinol NONE DETECTED NONE DETECTED   Barbiturates NONE DETECTED NONE DETECTED    Comment:        DRUG SCREEN FOR MEDICAL PURPOSES ONLY.  IF CONFIRMATION IS NEEDED FOR ANY PURPOSE, NOTIFY LAB WITHIN 5 DAYS.        LOWEST DETECTABLE LIMITS FOR URINE DRUG SCREEN Drug Class  Cutoff (ng/mL) Amphetamine      1000 Barbiturate      200 Benzodiazepine   161 Tricyclics       096 Opiates          300 Cocaine          300 THC              50   Urinalysis, Routine w reflex microscopic     Status: Abnormal   Collection Time: 06/06/17  4:28 PM  Result Value Ref Range   Color, Urine YELLOW YELLOW   APPearance CLEAR CLEAR   Specific Gravity, Urine 1.024 1.005 - 1.030   pH 5.0 5.0 - 8.0   Glucose, UA NEGATIVE NEGATIVE mg/dL   Hgb urine dipstick MODERATE (A) NEGATIVE   Bilirubin Urine NEGATIVE NEGATIVE   Ketones, ur NEGATIVE NEGATIVE mg/dL   Protein, ur 30 (A) NEGATIVE mg/dL   Nitrite  NEGATIVE NEGATIVE   Leukocytes, UA NEGATIVE NEGATIVE   RBC / HPF 0-5 0 - 5 RBC/hpf   WBC, UA 0-5 0 - 5 WBC/hpf   Bacteria, UA RARE (A) NONE SEEN   Squamous Epithelial / LPF NONE SEEN NONE SEEN   Mucous PRESENT   CBG monitoring, ED     Status: None   Collection Time: 06/06/17  7:19 PM  Result Value Ref Range   Glucose-Capillary 91 65 - 99 mg/dL   Ct Head Wo Contrast  Result Date: 06/06/2017 CLINICAL DATA:  Left-sided weakness EXAM: CT HEAD WITHOUT CONTRAST TECHNIQUE: Contiguous axial images were obtained from the base of the skull through the vertex without intravenous contrast. COMPARISON:  05/02/2017 FINDINGS: Brain: Mild atrophic and chronic white matter ischemic changes are seen. No findings to suggest acute hemorrhage, acute infarction or space-occupying mass lesion are noted. Vascular: No hyperdense vessel or unexpected calcification. Skull: Normal. Negative for fracture or focal lesion. Sinuses/Orbits: No acute finding. Other: None. IMPRESSION: Chronic atrophic and ischemic changes without acute abnormality. Electronically Signed   By: Inez Catalina M.D.   On: 06/06/2017 14:24   Assessment/Plan: 71 y.o. male presenting with a 3 day history of left sided weakness, recent fall, cognitive worsening. Exam reveals left hemiparesis, cognitive impairment and intermittent low-amplitude myoclonic jerks  Three day history of left sided weakness 1. Most likely secondary to new stroke 2. Stroke work up to include MRI brain, MRA of head, carotid ultrasound and TTE. 3. Start ASA 81 mg po qd 4. Start atorvastatin 40 mg po qd 5. BP management. Out of permissive HTN time window. 6. PT/OT/Speech 7. HgbA1c, fasting lipid panel 8. Telemetry monitoring 9. Frequent neuro checks  Dementia with hallucinations, waxing/waning cognition, mild rigidity, falls and stooped/shuffling gait 1. 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 Use antipsychotics with caution. Seroquel is generally considered to be one of  the safer alternatives in LBD 3. Consider a trial of Aricept  @Electronically  signed: Dr. Kerney Elbe  06/06/2017, 7:43 PM

## 2017-06-06 NOTE — ED Triage Notes (Signed)
Pt in via [facility] with c/o L sided weakness beginning 3 days ago. Per EMS and family, pt also had 2 falls within the past 3 days, no injuries noted. EMS states L side weakness began 3 days ago. Hx of seizures, dementia. A&ox2-3. BP 182/107

## 2017-06-07 ENCOUNTER — Observation Stay (HOSPITAL_COMMUNITY): Payer: Medicare Other

## 2017-06-07 ENCOUNTER — Observation Stay (HOSPITAL_BASED_OUTPATIENT_CLINIC_OR_DEPARTMENT_OTHER): Payer: Medicare Other

## 2017-06-07 DIAGNOSIS — Z87440 Personal history of urinary (tract) infections: Secondary | ICD-10-CM | POA: Diagnosis not present

## 2017-06-07 DIAGNOSIS — I160 Hypertensive urgency: Secondary | ICD-10-CM | POA: Diagnosis present

## 2017-06-07 DIAGNOSIS — I10 Essential (primary) hypertension: Secondary | ICD-10-CM | POA: Diagnosis not present

## 2017-06-07 DIAGNOSIS — W19XXXD Unspecified fall, subsequent encounter: Secondary | ICD-10-CM | POA: Diagnosis not present

## 2017-06-07 DIAGNOSIS — S0990XA Unspecified injury of head, initial encounter: Secondary | ICD-10-CM | POA: Diagnosis not present

## 2017-06-07 DIAGNOSIS — I5033 Acute on chronic diastolic (congestive) heart failure: Secondary | ICD-10-CM

## 2017-06-07 DIAGNOSIS — R7303 Prediabetes: Secondary | ICD-10-CM | POA: Diagnosis not present

## 2017-06-07 DIAGNOSIS — Z6828 Body mass index (BMI) 28.0-28.9, adult: Secondary | ICD-10-CM | POA: Diagnosis not present

## 2017-06-07 DIAGNOSIS — R1312 Dysphagia, oropharyngeal phase: Secondary | ICD-10-CM | POA: Diagnosis not present

## 2017-06-07 DIAGNOSIS — Z79891 Long term (current) use of opiate analgesic: Secondary | ICD-10-CM | POA: Diagnosis not present

## 2017-06-07 DIAGNOSIS — I69354 Hemiplegia and hemiparesis following cerebral infarction affecting left non-dominant side: Secondary | ICD-10-CM | POA: Diagnosis not present

## 2017-06-07 DIAGNOSIS — M4807 Spinal stenosis, lumbosacral region: Secondary | ICD-10-CM | POA: Diagnosis not present

## 2017-06-07 DIAGNOSIS — W19XXXA Unspecified fall, initial encounter: Secondary | ICD-10-CM | POA: Diagnosis present

## 2017-06-07 DIAGNOSIS — I4510 Unspecified right bundle-branch block: Secondary | ICD-10-CM | POA: Diagnosis present

## 2017-06-07 DIAGNOSIS — E669 Obesity, unspecified: Secondary | ICD-10-CM | POA: Diagnosis present

## 2017-06-07 DIAGNOSIS — R2681 Unsteadiness on feet: Secondary | ICD-10-CM | POA: Diagnosis not present

## 2017-06-07 DIAGNOSIS — Y92009 Unspecified place in unspecified non-institutional (private) residence as the place of occurrence of the external cause: Secondary | ICD-10-CM | POA: Diagnosis not present

## 2017-06-07 DIAGNOSIS — G248 Other dystonia: Secondary | ICD-10-CM | POA: Diagnosis present

## 2017-06-07 DIAGNOSIS — G464 Cerebellar stroke syndrome: Secondary | ICD-10-CM | POA: Diagnosis not present

## 2017-06-07 DIAGNOSIS — R531 Weakness: Secondary | ICD-10-CM

## 2017-06-07 DIAGNOSIS — Z9079 Acquired absence of other genital organ(s): Secondary | ICD-10-CM | POA: Diagnosis not present

## 2017-06-07 DIAGNOSIS — R296 Repeated falls: Secondary | ICD-10-CM | POA: Diagnosis present

## 2017-06-07 DIAGNOSIS — R600 Localized edema: Secondary | ICD-10-CM | POA: Diagnosis not present

## 2017-06-07 DIAGNOSIS — I69398 Other sequelae of cerebral infarction: Secondary | ICD-10-CM | POA: Diagnosis not present

## 2017-06-07 DIAGNOSIS — G253 Myoclonus: Secondary | ICD-10-CM | POA: Diagnosis present

## 2017-06-07 DIAGNOSIS — M48061 Spinal stenosis, lumbar region without neurogenic claudication: Secondary | ICD-10-CM | POA: Diagnosis present

## 2017-06-07 DIAGNOSIS — R5381 Other malaise: Secondary | ICD-10-CM | POA: Diagnosis not present

## 2017-06-07 DIAGNOSIS — Z9181 History of falling: Secondary | ICD-10-CM | POA: Diagnosis not present

## 2017-06-07 DIAGNOSIS — F29 Unspecified psychosis not due to a substance or known physiological condition: Secondary | ICD-10-CM | POA: Diagnosis present

## 2017-06-07 DIAGNOSIS — K219 Gastro-esophageal reflux disease without esophagitis: Secondary | ICD-10-CM | POA: Diagnosis not present

## 2017-06-07 DIAGNOSIS — G8190 Hemiplegia, unspecified affecting unspecified side: Secondary | ICD-10-CM | POA: Diagnosis not present

## 2017-06-07 DIAGNOSIS — R627 Adult failure to thrive: Secondary | ICD-10-CM | POA: Diagnosis not present

## 2017-06-07 DIAGNOSIS — R488 Other symbolic dysfunctions: Secondary | ICD-10-CM | POA: Diagnosis not present

## 2017-06-07 DIAGNOSIS — Z8546 Personal history of malignant neoplasm of prostate: Secondary | ICD-10-CM | POA: Diagnosis not present

## 2017-06-07 DIAGNOSIS — G629 Polyneuropathy, unspecified: Secondary | ICD-10-CM | POA: Diagnosis present

## 2017-06-07 DIAGNOSIS — Z79899 Other long term (current) drug therapy: Secondary | ICD-10-CM | POA: Diagnosis not present

## 2017-06-07 DIAGNOSIS — G249 Dystonia, unspecified: Secondary | ICD-10-CM | POA: Diagnosis present

## 2017-06-07 DIAGNOSIS — R410 Disorientation, unspecified: Secondary | ICD-10-CM | POA: Diagnosis not present

## 2017-06-07 DIAGNOSIS — I63033 Cerebral infarction due to thrombosis of bilateral carotid arteries: Secondary | ICD-10-CM | POA: Diagnosis not present

## 2017-06-07 DIAGNOSIS — M6281 Muscle weakness (generalized): Secondary | ICD-10-CM | POA: Diagnosis not present

## 2017-06-07 DIAGNOSIS — I6330 Cerebral infarction due to thrombosis of unspecified cerebral artery: Secondary | ICD-10-CM | POA: Diagnosis not present

## 2017-06-07 DIAGNOSIS — I639 Cerebral infarction, unspecified: Secondary | ICD-10-CM | POA: Diagnosis not present

## 2017-06-07 DIAGNOSIS — G3184 Mild cognitive impairment, so stated: Secondary | ICD-10-CM | POA: Diagnosis not present

## 2017-06-07 DIAGNOSIS — Z7409 Other reduced mobility: Secondary | ICD-10-CM | POA: Diagnosis not present

## 2017-06-07 DIAGNOSIS — R319 Hematuria, unspecified: Secondary | ICD-10-CM | POA: Diagnosis present

## 2017-06-07 DIAGNOSIS — Z87891 Personal history of nicotine dependence: Secondary | ICD-10-CM | POA: Diagnosis not present

## 2017-06-07 DIAGNOSIS — E876 Hypokalemia: Secondary | ICD-10-CM | POA: Diagnosis present

## 2017-06-07 DIAGNOSIS — G3189 Other specified degenerative diseases of nervous system: Secondary | ICD-10-CM | POA: Diagnosis not present

## 2017-06-07 LAB — ECHOCARDIOGRAM COMPLETE
HEIGHTINCHES: 69 in
Weight: 3073.6 oz

## 2017-06-07 LAB — VAS US CAROTID
LCCAPSYS: 126 cm/s
LEFT ECA DIAS: -11 cm/s
LEFT VERTEBRAL DIAS: 9 cm/s
Left CCA dist dias: -19 cm/s
Left CCA dist sys: -65 cm/s
Left CCA prox dias: 38 cm/s
Left ICA dist dias: -29 cm/s
Left ICA dist sys: -78 cm/s
Left ICA prox dias: -10 cm/s
Left ICA prox sys: -57 cm/s
RCCADSYS: -66 cm/s
RIGHT ECA DIAS: -13 cm/s
RIGHT VERTEBRAL DIAS: 16 cm/s
Right CCA prox dias: 18 cm/s
Right CCA prox sys: 78 cm/s

## 2017-06-07 LAB — LIPID PANEL
CHOL/HDL RATIO: 1.9 ratio
CHOLESTEROL: 87 mg/dL (ref 0–200)
HDL: 45 mg/dL (ref >40)
LDL Cholesterol: 33 mg/dL (ref 0–99)
TRIGLYCERIDES: 44 mg/dL (ref <150)
VLDL: 9 mg/dL (ref 0–40)

## 2017-06-07 NOTE — Progress Notes (Signed)
Progress Note    CAIDE CAMPI  YTK:[id number] DOB: August 01, 1946  DOA: 06/06/2017 PCP: [facility]    Brief Narrative:   Chief complaint: Follow-up weakness/falls  Medical records reviewed and are as summarized below:  Evan Charles is an 71 y.o. 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 During that admission, he was treated for Citrobacter UTI and was discharged home. He was brought to the hospital again 06/06/17 with a chief complaint of left-sided weakness after suffering from a fall at home several days ago.  Assessment/Plan:   Principal Problem:   Failure to thrive in adult with physical deconditioning, altered mental status, fall at home, generalized weakness rule out acute CVA in a patient with advanced microvascular disease on imaging Full stroke workup initiated. Neurologist consulting.Continue aspirin, statin. 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 MRA negative for proximal stenosis. Carotid Dopplers show 1-39 percent internal carotid artery stenosis bilaterally. 2-D echo showed EF 60-65 percent, grade 1 diastolic dysfunction, no embolic source.PT/OT/ST evaluations requested. Will need outpatient speech therapy, but PT and OT evaluation still pending. Cholesterol 87, LDL 33. Hemoglobin A1c pending.  Active Problems:   Left hand and forearm dystonia Outpatient neurology  follow-up recommended by neurologist.    Mild cognitive impairment Appears to have capacity per prior psychiatric evaluation despite the fact that it is clear that he cannot adequately care for himself at home. Social worker will need to be consulted to evaluate home situation.     Hypokalemia Potassium added to IV fluids.    Impaired mobility and ADLs/Spinal stenosis of lumbar region without neurogenic claudication/Weakness PT/OT evaluations requested.    Accelerated hypertension Patient does have significant hypertension, and may have hypertensive encephalopathy. Would allow some permissive hypertension until stroke ruled out. Hold antihypertensives this blood pressure on the low side this morning.    Family Communication/Anticipated D/C date and plan/Code Status   DVT prophylaxis: Lovenox ordered. Code Status: Partial code.  Family Communication: No family present. Disposition Plan: SNF if patient will agree, otherwise home with home health services.   Medical Consultants:    Neurology   Anti-Infectives:    None  Subjective:   More awake, alert, conversant today. Now oriented to place but not date. Says he has had left sided weakness, and that he was having bilateral lower extremity "spasms".   Objective:    Vitals:   06/07/17 0605 06/07/17 0826 06/07/17 1001 06/07/17 1359  BP: 138/87 121/78 120/74 117/81  Pulse: 63 77 84 92  Resp: 17 18 18 18   Temp: 97.7 F (36.5 C) 97.9 F (36.6 C) 98.2 F (36.8 C)   TempSrc: Oral Oral Axillary   SpO2: 98% 99% 98% 96%  Weight:      Height:        Intake/Output Summary (Last 24 hours) at 06/07/17 1619 Last data filed at 06/07/17 0900  Gross per 24 hour  Intake  955 ml  Output              750 ml  Net              205 ml   Filed Weights   06/06/17 1307 06/06/17 2029  Weight: 86.2 kg (190 lb) 87.1 kg (192 lb 1.6 oz)    Exam: General exam: Awake, alert, conversant. Not in distress. Respiratory  system: Lungs are clear to auscultation.  Cardiovascular system: Heart sounds are regular. No murmurs, rubs or gallops.  Gastrointestinal system: Abdomen is soft, non-tender, non-distended with normal active bowel sounds.  Central nervous system: Alert and oriented to self and place.  Thinks it is May. ? Left lip droop, left hand with dystonia. Extremities: 2+ edema. No clubbing or cyanosis. Skin: No rashes.  Warm and dry. Psychiatry: Mood and affect flat. Insight impaired. Poor judgement.   Data Reviewed:   I have personally reviewed following labs and imaging studies:  Labs: Labs show the following:   Basic Metabolic Panel:  Recent Labs Lab 05/31/17 1742 06/06/17 1358 06/06/17 1430  NA 139 140 141  K 3.4* 3.2* 3.2*  CL 106 99* 96*  CO2 23 31  --   GLUCOSE 99 101* 101*  BUN 14 10 11   CREATININE 0.95 1.05 0.90  CALCIUM 9.0 9.1  --    GFR Estimated Creatinine Clearance: 82.3 mL/min (by C-G formula based on SCr of 0.9 mg/dL). Liver Function Tests:  Recent Labs Lab 06/06/17 1358  AST 95*  ALT 28  ALKPHOS 57  BILITOT 1.3*  PROT 6.7  ALBUMIN 3.6   No results for input(s): LIPASE, AMYLASE in the last 168 hours. No results for input(s): AMMONIA in the last 168 hours. Coagulation profile  Recent Labs Lab 06/06/17 1358  INR 1.28    CBC:  Recent Labs Lab 05/31/17 1742 06/06/17 1358 06/06/17 1430  WBC 4.3 6.2  --   NEUTROABS  --  4.4  --   HGB 12.3* 13.5 15.3  HCT 38.8* 42.7 45.0  MCV 84.9 84.6  --   PLT 223 216  --    Cardiac Enzymes: CBG:  Recent Labs Lab 06/06/17 1919  GLUCAP 91   Lipid Profile:  Recent Labs  06/07/17 0407  CHOL 87  HDL 45  LDLCALC 33  TRIG 44  CHOLHDL 1.9    Microbiology Recent Results (from the past 240 hour(s))  Urine culture     Status: Abnormal   Collection Time: 05/31/17 12:04 AM  Result Value Ref Range Status   Specimen Description URINE, RANDOM  Final   Special Requests NONE  Final   Culture <10,000  COLONIES/mL INSIGNIFICANT GROWTH (A)  Final   Report Status 06/02/2017 FINAL  Final    Procedures and diagnostic studies:  Ct Head Wo Contrast  Result Date: 06/06/2017 CLINICAL DATA:  Left-sided weakness EXAM: CT HEAD WITHOUT CONTRAST TECHNIQUE: Contiguous axial images were obtained from the base of the skull through the vertex without intravenous contrast. COMPARISON:  05/02/2017 FINDINGS: Brain: Mild atrophic and chronic white matter ischemic changes are seen. No findings to suggest acute hemorrhage, acute infarction or space-occupying mass lesion are noted. Vascular: No hyperdense vessel or unexpected calcification. Skull: Normal. Negative for fracture or focal lesion. Sinuses/Orbits: No acute finding. Other: None. IMPRESSION: Chronic atrophic and ischemic changes without acute abnormality. Electronically Signed   By: Inez Catalina M.D.   On: 06/06/2017 14:24   Mr Brain Wo Contrast  Result Date: 06/07/2017 CLINICAL DATA:  Stroke and  confusion.  Stroke.  Frequent falls. EXAM: MRI HEAD WITHOUT CONTRAST MRA HEAD WITHOUT CONTRAST TECHNIQUE: Multiplanar, multiecho pulse sequences of the brain and surrounding structures were obtained without intravenous contrast. Angiographic images of the head were obtained using MRA technique without contrast. COMPARISON:  None. FINDINGS: MRI HEAD FINDINGS Brain: A punctate non hemorrhagic infarct is present within the posterior right temporal or occipital lobe. No other acute infarct is present. Mild generalized atrophy and white matter changes are present bilaterally. The ventricles are proportionate to the degree of atrophy. No significant extra-axial fluid collection is present. Vascular: Flow is present in the major intracranial arteries. Skull and upper cervical spine: The skullbase is within normal limits. Craniocervical junction is normal. Midline sagittal structures are unremarkable. Sinuses/Orbits: The paranasal sinuses and mastoid air cells are clear. The globes  and orbits are within normal limits. MRA HEAD FINDINGS Internal carotid arteries are within normal limits from the high cervical segments through the ICA termini bilaterally. The A1 and M1 segments are normal. The MCA bifurcations are intact. There is mild attenuation of distal ACA and MCA branch vessels. The vertebral arteries are codominant. The basilar artery is within normal limits. Both posterior cerebral arteries originate from basilar tip. The PCA branch vessels are within normal limits bilaterally. IMPRESSION: 1. 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 2. No other acute intracranial abnormality. 3. Mild diffuse atrophy and white matter disease, somewhat advanced for age. 4. Normal variant MRA circle of Willis. No significant proximal stenosis, aneurysm, or branch vessel occlusion. Electronically Signed   By: San Morelle M.D.   On: 06/07/2017 13:22   Mr Jodene Nam Head/brain ZO Cm  Result Date: 06/07/2017 CLINICAL DATA:  Stroke and confusion.  Stroke.  Frequent falls. EXAM: MRI HEAD WITHOUT CONTRAST MRA HEAD WITHOUT CONTRAST TECHNIQUE: Multiplanar, multiecho pulse sequences of the brain and surrounding structures were obtained without intravenous contrast. Angiographic images of the head were obtained using MRA technique without contrast. COMPARISON:  None. FINDINGS: MRI HEAD FINDINGS Brain: A punctate non hemorrhagic infarct is present within the posterior right temporal or occipital lobe. No other acute infarct is present. Mild generalized atrophy and white matter changes are present bilaterally. The ventricles are proportionate to the degree of atrophy. No significant extra-axial fluid collection is present. Vascular: Flow is present in the major intracranial arteries. Skull and upper cervical spine: The skullbase is within normal limits. Craniocervical junction is normal. Midline sagittal structures are unremarkable. Sinuses/Orbits: The  paranasal sinuses and mastoid air cells are clear. The globes and orbits are within normal limits. MRA HEAD FINDINGS Internal carotid arteries are within normal limits from the high cervical segments through the ICA termini bilaterally. The A1 and M1 segments are normal. The MCA bifurcations are intact. There is mild attenuation of distal ACA and MCA branch vessels. The vertebral arteries are codominant. The basilar artery is within normal limits. Both posterior cerebral arteries originate from basilar tip. The PCA branch vessels are within normal limits bilaterally. IMPRESSION: 1. 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 2. No other acute intracranial abnormality. 3. Mild diffuse atrophy and white matter disease, somewhat advanced for age. 4. Normal variant MRA circle of Willis. No significant proximal stenosis, aneurysm, or branch vessel occlusion. Electronically Signed   By: San Morelle M.D.   On: 06/07/2017 13:22    Medications:   . aspirin  300 mg Rectal Daily   Or  . aspirin  325 mg  Oral Daily  . enoxaparin (LOVENOX) injection  40 mg Subcutaneous Q24H  . QUEtiapine  50-100 mg Oral QHS   Continuous Infusions: . 0.9 % NaCl with KCl 40 mEq / L 100 mL/hr (06/07/17 0825)    Medical decision making is of high complexity and this patient is at high risk of deterioration, therefore this is a level 3 visit.  (> 4 problem points, 3 data points, high risk: Need 2 out of 3)  Problems/DDx Points   Self limited or minor (max 2)       1   Established problem, stable       1  4+  Established problem, worsening       2   New problem, no additional W/U planned (max 1)       3   New problem, additional W/U planned        4  4   Data Reviewed Points   Review/order clinical lab tests       1  1  Review/order x-rays       1   Review/order tests (Echo, EKG, PFTs, etc)       1   Discussion of test results w/ performing MD       1     Independent review of image, tracing or specimen       2  2  Decision to obtain old records       1   Review and summation of old records       2    Level of risk Presenting prob Diagnostics Management   Minimal 1 self limited/minor Labs CXR EKG/EEG U/A U/S Rest Gargles Bandages Dressings   Low 2 or more self limited/minor 1 stable chronic Acute uncomplicated illness Tests (PFTS) Non-CV imaging Arterial labs Biopsies of skin OTC drugs Minor surgery-no risk PT OT IVF without additives    Moderate 1 or more chronic illnesses w/ mild exac, progression or S/E from tx 2 or more stable chronic illnesses Undiagnosed new problem w/ uncertain prognosis Acute complicated injury  Stress tests Endoscopies with no risk factors Deep needle or incisional bx CV imaging without risk LP Thoracentesis Paracentesis Minor surgery w/ risks Elective major surgery w/ no risk (open, percutaneous or endoscopic) Prescription drugs Therapeutic nucl med IVF with additives Closed tx of fracture/dislocation    High Severe exac of chronic illness Acute or chronic illness/injury may pose a threat to life or bodily function (ARF) Change in neuro status    CV imaging w/ contrast and risk Cardio electophysiologic tests Endoscopies w/ risk Discography Elective major surgery Emergency major surgery Parenteral controlled substances Drug therapy req monitoring for toxicity DNR/de-escalation of care    MDM Prob points Data points Risk   Straightforward    <1    <1    Min   Low complexity    2    2    Low   Moderate    3    3    Mod   High Complexity    4 or more    4 or more    High      LOS: 0 days   Kashawn Manzano  [facility] Pager [phone]. If unable to reach me by pager, please call my cell phone at [phone].  *Please refer to [email or web address], password [email or web address] to get updated schedule on who will round on this patient, as hospitalists switch teams weekly. If 7PM-7AM, please contact  night-coverage at [email or web address], password [facility]  for any overnight needs.  06/07/2017, 4:19 PM

## 2017-06-07 NOTE — Progress Notes (Signed)
*  PRELIMINARY RESULTS* Vascular Ultrasound Carotid Duplex (Doppler) has been completed.   Findings suggest 1-39% internal carotid artery stenosis bilaterally. Vertebral arteries are patent with antegrade flow.  06/07/2017 10:46 AM Maudry Mayhew, BS, RVT, RDCS, RDMS

## 2017-06-07 NOTE — Evaluation (Signed)
Occupational Therapy Evaluation Patient Details Name: Evan Charles MRN: [id number] DOB: 30-Oct-1946 Today's Date: 06/07/2017    History of Present Illness Pt is an 71 y.o. male with a history of spinal stenosis, seizures, visual hallucinations, and dementia, who presented to [facility] with a 3 day history of left sided weakness, recent fall, cognitive worsening. Exam reveals left hemiparesis, cognitive impairment and intermittent low-amplitude myoclonic jerks.   Clinical Impression   PTA Pt min A for bathing and otherwise modified independent in ADL and mobility with RW. Pt is currently mod A for all ADL from seated position and max +2 for stand pivot transfer with max vc and RW. Please see OT problem list below. Pt will require skilled OT in the acute setting to maximize safety and independence in ADL and functional transfers. Pt IS NOT SAFE TO GO HOME and will require SNF level therapy to return to PLOF. During the session, Pt verbalized that he knows he needs therapy to get stronger and safer "for my body and my mind". Next session to focus on LUE and potentially HEP (need to establish goal if this is the case).     Follow Up Recommendations  SNF;Supervision/Assistance - 24 hour    Equipment Recommendations  Other (comment) (defer to next venue)    Recommendations for Other Services       Precautions / Restrictions Precautions Precautions: Fall Restrictions Weight Bearing Restrictions: No      Mobility Bed Mobility Overal bed mobility: Needs Assistance Bed Mobility: Supine to Sit     Supine to sit: +2 for physical assistance;Mod assist;HOB elevated     General bed mobility comments: assist to bring BLE EOB, assist to elevate trunk and bring hips EOB  Transfers Overall transfer level: Needs assistance Equipment used: Rolling walker (2 wheeled) Transfers: Sit to/from [facility] Sit to Stand: Max assist;+2 physical assistance;+2 safety/equipment Stand pivot  transfers: Max assist;+2 physical assistance;+2 safety/equipment       General transfer comment: vc to bring feet underneath him for stability. very slow and puts most of weight through right side. Pt required assistance to move the RW during transfer    Balance Overall balance assessment: Needs assistance Sitting-balance support: Bilateral upper extremity supported;Feet supported Sitting balance-Leahy Scale: Poor Sitting balance - Comments: required min A to maintain sitting EOB Postural control: Posterior lean;Right lateral lean Standing balance support: Bilateral upper extremity supported;During functional activity Standing balance-Leahy Scale: Poor Standing balance comment: reliant on suppport frmo therapists                           ADL either performed or assessed with clinical judgement   ADL Overall ADL's : Needs assistance/impaired Eating/Feeding: Set up   Grooming: Moderate assistance;Sitting   Upper Body Bathing: Moderate assistance   Lower Body Bathing: Maximal assistance   Upper Body Dressing : Moderate assistance   Lower Body Dressing: Total assistance;+2 for physical assistance;+2 for safety/equipment;Sit to/from stand Lower Body Dressing Details (indicate cue type and reason): to don pants Toilet Transfer: Maximal assistance;+2 for physical assistance;+2 for safety/equipment;Stand-pivot;BSC;RW Toilet Transfer Details (indicate cue type and reason): Pt puts most of weight through the Right side due to pain in left. Pt also requires assist for manipulation of RW   Toileting - Clothing Manipulation Details (indicate cue type and reason): Pt currently with condom catheter     Functional mobility during ADLs: Maximal assistance;+2 for physical assistance;+2 for safety/equipment;Cueing for sequencing;Rolling walker;Cueing for safety  Vision Baseline Vision/History: Wears glasses Wears Glasses: At all times Patient Visual Report: No change from  baseline Vision Assessment?: Yes Eye Alignment: Within Functional Limits Ocular Range of Motion: Within Functional Limits Alignment/Gaze Preference: Within Defined Limits Tracking/Visual Pursuits: Decreased smoothness of horizontal tracking;Able to track stimulus in all quads without difficulty Visual Fields: No apparent deficits     Perception     Praxis      Pertinent Vitals/Pain Pain Assessment: 0-10 Pain Score: 7  Pain Location: back; left shoulder Pain Descriptors / Indicators: Constant;Discomfort Pain Intervention(s): Monitored during session;Repositioned     Hand Dominance Right   Extremity/Trunk Assessment Upper Extremity Assessment Upper Extremity Assessment: LUE deficits/detail LUE Deficits / Details: limted shoulder forward flexion; limited ROM in hand - unable to open all the way or create grasp - OT able to range as hand was tight LUE: Unable to fully assess due to pain LUE Coordination: decreased fine motor;decreased gross motor   Lower Extremity Assessment Lower Extremity Assessment: Defer to PT evaluation       Communication Communication Communication: No difficulties   Cognition Arousal/Alertness: Awake/alert Behavior During Therapy: WFL for tasks assessed/performed Overall Cognitive Status: Impaired/Different from baseline Area of Impairment: Attention;Following commands;Safety/judgement;Awareness;Problem solving                   Current Attention Level: Selective   Following Commands: Follows one step commands with increased time Safety/Judgement: Decreased awareness of safety Awareness: Emergent Problem Solving: Slow processing;Requires verbal cues;Requires tactile cues;Difficulty sequencing General Comments: Pt with random comments that were not applicable to context/question at hand   General Comments  Pt with waxing and waining appreciation for therapy during session. "I'm so glad you helped me.Marland KitchenMarland KitchenI'm mad at you for not getting me a  pepsi". It was hard to tell when the patient was trying to joke around with therapist, and when he was really sharing how he felt.    Exercises     Shoulder Instructions      Home Living Family/patient expects to be discharged to:: Private residence Living Arrangements: Alone Available Help at Discharge: Family;Friend(s);Available PRN/intermittently Type of Home: House Home Access: Stairs to enter [employer] of Steps: 6 Entrance Stairs-Rails: Right;Left Home Layout: One level     Bathroom Shower/Tub: Walk-in shower;Tub/shower unit   Bathroom Toilet: Standard     Home Equipment: Environmental consultant - 2 wheels;Cane - single point;Bedside commode;Shower seat;Wheelchair - manual   Additional Comments: information from previous admission as pt cannot recall details  Lives With: Alone    Prior Functioning/Environment Level of Independence: Needs assistance  Gait / Transfers Assistance Needed: RW for gait at times ADL's / Homemaking Assistance Needed: nephew helps with errands and appts, food from Meals on Wheels   Comments: pt states he uses RW, assist from nephew for bathing, meals on wheels        OT Problem List: Decreased strength;Decreased range of motion;Decreased activity tolerance;Impaired balance (sitting and/or standing);Decreased cognition;Decreased safety awareness;Decreased knowledge of use of DME or AE;Impaired UE functional use;Pain      OT Treatment/Interventions: Self-care/ADL training;Therapeutic exercise;Neuromuscular education;Energy conservation;DME and/or AE instruction;Therapeutic activities;Cognitive remediation/compensation;Patient/family education;Balance training    OT Goals(Current goals can be found in the care plan section) Acute Rehab OT Goals Patient Stated Goal: to get mind and body stronger before going home OT Goal Formulation: With patient Time For Goal Achievement: 06/21/17 Potential to Achieve Goals: Good ADL Goals Pt Will Perform  Grooming: with supervision;standing Pt Will Perform Upper Body Bathing: with min assist;sitting;with caregiver  independent in assisting Pt Will Perform Lower Body Bathing: with min assist;with caregiver independent in assisting;sit to/from stand Pt Will Transfer to Toilet: with supervision;stand pivot transfer;bedside commode (with RW) Pt Will Perform Toileting - Clothing Manipulation and hygiene: with modified independence;sit to/from stand Additional ADL Goal #1: Pt will perform bed mobility as precursor to ADL at supervision level  OT Frequency: Min 2X/week   Barriers to D/C: Decreased caregiver support;Inaccessible home environment  Pt lives alone and needs to get up stairs to enter house       Co-evaluation              AM-PAC PT "6 Clicks" Daily Activity     Outcome Measure Help from another person eating meals?: A Little Help from another person taking care of personal grooming?: A Lot Help from another person toileting, which includes using toliet, bedpan, or urinal?: A Lot Help from another person bathing (including washing, rinsing, drying)?: A Lot Help from another person to put on and taking off regular upper body clothing?: A Lot Help from another person to put on and taking off regular lower body clothing?: Total 6 Click Score: 12   End of Session Equipment Utilized During Treatment: Gait belt;Rolling walker Nurse Communication: Mobility status;Need for lift equipment (STEDY)  Activity Tolerance: Patient tolerated treatment well Patient left: in chair;with call bell/phone within reach;with chair alarm set  OT Visit Diagnosis: Unsteadiness on feet (R26.81);Other abnormalities of gait and mobility (R26.89);Repeated falls (R29.6);Muscle weakness (generalized) (M62.81);History of falling (Z91.81);Other symptoms and signs involving cognitive function;Pain Pain - Right/Left: Left Pain - part of body: Shoulder;Arm                Time: 7353-2992 OT Time Calculation  (min): 30 min Charges:  OT General Charges $OT Visit: 1 Procedure OT Evaluation $OT Eval Moderate Complexity: 1 Procedure OT Treatments $Self Care/Home Management : 8-22 mins G-Codes: OT G-codes **NOT FOR INPATIENT CLASS** Functional Assessment Tool Used: AM-PAC 6 Clicks Daily Activity Functional Limitation: Self care Self Care Current Status (E2683): At least 60 percent but less than 80 percent impaired, limited or restricted Self Care Goal Status (M1962): At least 1 percent but less than 20 percent impaired, limited or restricted   Hulda Humphrey OTR/L [place] 06/07/2017, 5:21 PM

## 2017-06-07 NOTE — Care Management Note (Signed)
Case Management Note  Patient Details  Name: CHARLENE COWDREY MRN: [id number] Date of Birth: Nov 26, 1945  Subjective/Objective:   Pt admitted with falls and failure to thrive. He is from home alone.                  Action/Plan: [facility] CM called Lind Guest with [facility] and informed her of admission. Awaiting PT/OT recommendations. CM following for d/c disposition.   Expected Discharge Date:  06/08/17               Expected Discharge Plan:  [place]  In-House Referral:     Discharge planning Services  CM Consult  Post Acute Care Choice:    Choice offered to:     DME Arranged:    DME Agency:     HH Arranged:    HH Agency:     Status of Service:  In process, will continue to follow  If discussed at Long Length of Stay Meetings, dates discussed:    Additional Comments:  Pollie Friar, RN 06/07/2017, 11:15 AM

## 2017-06-07 NOTE — Progress Notes (Signed)
PT Cancellation Note  Patient Details Name: Evan Charles MRN: [id number] DOB: [birth year]   Cancelled Treatment:    Reason Eval/Treat Not Completed: Patient at procedure or test/unavailable. Pt off of the floor at vascular. PT will continue to f/u with pt as available.    [place] 06/07/2017, 10:37 AM

## 2017-06-07 NOTE — Progress Notes (Signed)
STROKE TEAM PROGRESS NOTE   HISTORY OF PRESENT ILLNESS (per record) HPI: Evan Charles is an 71 y.o. male with a history of chronic mental illness/dementia, frequent falls, seizures, spinal stenosis, and hypertension, who presented to [facility] 06/06/2017 with a 3 day history of left upper and lower extremity weakness. He has also had 2 falls over the past 3 days, without injuries noted by family or EMS. He stated that he "can't walk right" and was dropping things when he tried to pick them up with his left hand. The subtype of the dementia has not been diagnosed. Of note, he has waxing and waning levels of cognition with "good days and bad days" per his relative. He also has formed visual hallucinations of people, which has become less responsive to antipsychotics over time. His relative is unsure if the patient thrashes around in his sleep or acts out his dreams. The patient has had shuffling gait and slow movements which have progressed gradually over the last 2 years - his relative had less contact with the patient before then and is unsure when the dementia or movement problems first started. BP 182/107.  Head CT obtained in the ED revealed no acute abnormality. Chronic atrophic and ischemic changes are noted.  Patient was not administered IV t-PA secondary to arriving outside of the treatment window. He was admitted to General Neurology for further evaluation and treatment.   SUBJECTIVE (INTERVAL HISTORY) He is alone in the room.  The patient is awake, mildly confused and forgetful, but can follow commands appropriately.  The patient's left hand and forearm are dystonic.He is unable to tell me as to how long this had trouble with his hand. He says is not seen a neurologist     OBJECTIVE Temp:  [97.7 F (36.5 C)-98.6 F (37 C)] 98.2 F (36.8 C) (07/27 1001) Pulse Rate:  [63-92] 92 (07/27 1359) Cardiac Rhythm: Normal sinus rhythm;Heart block (07/27 0702) Resp:  [16-20] 18 (07/27 1359) BP:  (106-138)/(67-92) 117/81 (07/27 1359) SpO2:  [94 %-100 %] 96 % (07/27 1359) Weight:  [87.1 kg (192 lb 1.6 oz)] 87.1 kg (192 lb 1.6 oz) (07/26 2029)  CBC:   Recent Labs Lab 05/31/17 1742 06/06/17 1358 06/06/17 1430  WBC 4.3 6.2  --   NEUTROABS  --  4.4  --   HGB 12.3* 13.5 15.3  HCT 38.8* 42.7 45.0  MCV 84.9 84.6  --   PLT 223 216  --     Basic Metabolic Panel:   Recent Labs Lab 05/31/17 1742 06/06/17 1358 06/06/17 1430  NA 139 140 141  K 3.4* 3.2* 3.2*  CL 106 99* 96*  CO2 23 31  --   GLUCOSE 99 101* 101*  BUN 14 10 11   CREATININE 0.95 1.05 0.90  CALCIUM 9.0 9.1  --     Lipid Panel:     Component Value Date/Time   CHOL 87 06/07/2017 0407   TRIG 44 06/07/2017 0407   HDL 45 06/07/2017 0407   CHOLHDL 1.9 06/07/2017 0407   VLDL 9 06/07/2017 0407   LDLCALC 33 06/07/2017 0407   HgbA1c: No results found for: HGBA1C Urine Drug Screen:     Component Value Date/Time   LABOPIA NONE DETECTED 06/06/2017 1628   COCAINSCRNUR NONE DETECTED 06/06/2017 1628   LABBENZ NONE DETECTED 06/06/2017 1628   AMPHETMU NONE DETECTED 06/06/2017 1628   THCU NONE DETECTED 06/06/2017 1628   LABBARB NONE DETECTED 06/06/2017 1628    Alcohol Level     Component  Value Date/Time   ETH <5 06/06/2017 1358    IMAGING  Ct Head Wo Contrast 06/06/2017 IMPRESSION: Chronic atrophic and ischemic changes without acute abnormality.  Mr Brain 49 Contrast Mr Jodene Nam Head/brain Wo Cm 06/07/2017 IMPRESSION: 1. 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 2. No other acute intracranial abnormality. 3. Mild diffuse atrophy and white matter disease, somewhat advanced for age. 4. Normal variant MRA circle of Willis. No significant proximal stenosis, aneurysm, or branch vessel occlusion.  Carotid US 06/07/2017 Summary: Findings suggest 1-39% internal carotid artery stenosis bilaterally. Vertebral arteries are patent with antegrade flow.  2D  Echo 06/07/2017 Study Conclusions - Left ventricle: The cavity size was normal. Wall thickness was increased in a pattern of moderate LVH. Systolic function was normal. The estimated ejection fraction was in the range of 60% to 65%. Wall motion was normal; there were no regional wall motion abnormalities. Doppler parameters are consistent with abnormal left ventricular relaxation (grade 1 diastolic dysfunction). THe E/e&' ratio is between 8-15 suggesting indeterminate LV filling pressure. - Left atrium: The atrium was normal in size. - Atrial septum: No defect or patent foramen ovale was identified. - Inferior vena cava: The vessel was normal in size. The respirophasic diameter changes were in the normal range (>= 50%), consistent with normal central venous pressure. Impressions: - LVEF 60-65%, moderate LVH, normal wall motion, grade 1 DD, indeterminate LV filling pressure, normal LA size, normal IVC.   PHYSICAL EXAM Frail elderly African-American male currently not in distress. . Afebrile. Head is nontraumatic. Neck is supple without bruit.    Cardiac exam no murmur or gallop. Lungs are clear to auscultation. Distal pulses are well felt. Neurological Exam :  Awake alert oriented 2. Diminished attention, registration and recall. Follows midline and simple one-step commands. Extraocular movements are pharyngeal or nystagmus. No dysarthria or aphasia. Pupils equal reactive. Fundi were not visualized. No facial weakness. Tongue midline. Motor system exam reveals no upper or lower extremity drift but he has non-fixed contractures versus dystonia of the left hand. Mild weakness of left hand and intrinsic hand muscles He is mild cogwheel rigidity in both upper extremities. Deep tendon reflexes are 2+ symmetric plantars are downgoing. Gait was not tested. ASSESSMENT/PLAN Mr. Evan Charles is a 71 y.o. male with history ofchronic mental illness/dementia, frequent falls, seizures, spinal stenosis, and  hypertension, who presented to [facility] 06/06/2017 with a 3 day history of left upper and lower extremity weakness. He did not receive IV t-PA due to arriving outside of the treatment window.   Stroke: Punctate nonhemorrhagic white matter infarct in the posterior right temporal or occipital lobe in the setting of somewhat advanced white matter atrophy. Etiology likely small vessel disease  Resultant  mild left-sided weakness  CT head: No stroke  MRI head:  Punctate nonhemorrhagic white matter infarct in the posterior right temporal or occipital lobe   MRA head  normal   Carotid Doppler: B ICA 1-39% stenosis, VAs antegrade  2D Echo: EF 60-65%. No source of embolus   LDL 33  HgbA1c pending   SCDs for VTE prophylaxis Diet Heart Room service appropriate? Yes; Fluid consistency: Thin  No antithrombotic prior to admission, now on aspirin 325 mg daily  Patient counseled to be compliant with his antithrombotic medications  Ongoing aggressive stroke risk factor management  Therapy recommendations:  pending  Disposition:  pending  Hypertension  Stable  Permissive hypertension (OK if < 220/120) but gradually normalize in 5-7 days  Long-term BP goal normotensive  Other Stroke Risk Factors  Advanced age  Overweight, Body mass index is 28.37 kg/m., recommend weight loss, diet and exercise as appropriate   Other Active Problems  Hematuria  RBBB  Hospital day # 0  I have personally examined this patient, reviewed notes, independently viewed imaging studies, participated in medical decision making and plan of care.ROS completed by me personally and pertinent positives fully documented  I have made any additions or clarifications directly to the above note. 66-year-old male with known dementia with extraluminal symptoms possibly neurally body presents with new left-sided weakness secondary to small right parietal white matter infarct from small vessel disease. Recommend aspirin 325 mg  daily and continue ongoing stroke evaluation. Discussed with Dr. Rockne Menghini. Greater than 50% time during this 35 minutet visit was spent on counseling and coordination of care about stroke prevention and treatment discussion and answering questions.  Antony Contras, MD Medical Director [place] Pager: [phone] 06/07/2017 4:17 PM   To contact Stroke Continuity provider, please refer to [email or web address] After hours, contact General Neurology

## 2017-06-07 NOTE — Progress Notes (Signed)
  Echocardiogram 2D Echocardiogram has been performed.  Evan Charles 06/07/2017, 9:52 AM

## 2017-06-07 NOTE — Evaluation (Addendum)
Speech Language Pathology Evaluation Patient Details Name: Evan Charles MRN: [id number] DOB: 20-Sep-1946 Today's Date: 06/07/2017 Time: 4403-4742 SLP Time Calculation (min) (ACUTE ONLY): 29 min  Problem List:  Patient Active Problem List   Diagnosis Date Noted  . 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 Weakness 06/06/2017  . Accelerated hypertension 06/06/2017  . Leg weakness 04/28/2017  . 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 Impaired mobility and ADLs   . Hypokalemia 11/05/2016  . Dementia with psychosis 09/12/2016  . Acute delirium 09/08/2016  . Normocytic anemia 09/08/2016  . Altered mental status 09/08/2016  . Fall at home   . Mild cognitive impairment 07/25/2016  . 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 Immobility 03/20/2016  . Generalized weakness 03/20/2016  . Failure to thrive in adult 03/20/2016  . Neuropathy   . Essential hypertension   . Physical deconditioning   . 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 GERD without esophagitis 12/19/2015  . Bilateral lower extremity edema 12/19/2015  . Intractable low back pain 02/14/2015  . Difficulty walking 02/14/2015  . H/O: upper GI bleed   . Back pain at L4-L5 level   . Visual hallucinations   . Psychoses    Past Medical History:  Past Medical History:  Diagnosis Date  . Back pain   . Bilateral lower extremity edema 04/2017  . Chronic mental illness   . Colitis   . Dementia   . GERD (gastroesophageal reflux disease)   . Hypertension   . Neuropathy    " MY HANDS "  . Prostate cancer ([place])   . Rhabdomyolysis 09/08/2016  . UTI (urinary tract infection) 04/2017   Past Surgical History:  Past Surgical History:  Procedure Laterality Date  . KNEE ARTHROSCOPY    . LAMINECTOMY    . PROSTATECTOMY     HPI:  71 y.o. male with a PMH of chronic lower back pain secondary to spinal stenosis and degenerative disease of the spine,  unspecified psychosis with intermittent hallucinations which is managed with Seroquel, poor social circumstance who has declined SNF placement in the past, and who has found to have capacity by prior psychiatric evaluation, frequent falls with most recent hospitalization 05/02/17-05/06/17 after he was found on the floor by healthcare workers incontinent of urine. During that admission, he was treated for Citrobacter UTI and was discharged home. He was brought to the hospital again today with a chief complaint of left-sided weakness after suffering from a fall at home several days ago.The patient is clearly confused when I try to ascertain a history from him. He is unaware that he is at the hospital, thinks he is at social services. Cannot give me any details about falling recently or his current situation. It is very difficult to understand his speech: MRI on 06/07/17 revealed  Punctate nonhemorrhagic white matter infarct in the posterior right temporal or occipital lobe  Assessment / Plan / Recommendation Clinical Impression   Pt administered the [facility] Cognitive Assessment) and received a score of 12/30 (26/30 typical score) with deficits noted in the areas of visuospatial/executive functioning, attention, orientation and memory.  Baseline dementia noted in H & P, but extent is not known as family was not present during the assessment.  He was not able to recall words given after a delay, decreased sustained attention, and visuospatial/executive functioning skills.  His speech was 100% intelligible within complex conversation and naming appeared [name] for confrontational and visual tasks.  Functional  reading appears [facility] for larger text, but he did exhibit some visuo-spatial difficulty.  Pt stated he has had memory deficits in the past and "takes me a while" to complete some tasks.  He had difficulty repeating longer sentences without minimal verbal cueing.  Recommend ST f/u while in acute setting for  cognition; Discharge planning is TBD, but ST should f/u at next venue of care for safety/cognition.    SLP Assessment  SLP Recommendation/Assessment: Patient needs continued Speech Lanaguage Pathology Services SLP Visit Diagnosis: Cognitive communication deficit (R41.841)    Follow Up Recommendations  Home health SLP;Other (comment) (TBD)    Frequency and Duration min 2x/week  1 week      SLP Evaluation Cognition  Overall Cognitive Status: Impaired/Different from baseline Arousal/Alertness: Awake/alert Orientation Level: Oriented to person;Oriented to place;Oriented to situation Attention: Sustained Sustained Attention: Impaired Sustained Attention Impairment: Verbal basic;Functional basic Memory: Impaired Memory Impairment: Retrieval deficit;Decreased recall of new information;Decreased short term memory Decreased Short Term Memory: Verbal basic;Functional basic Awareness: Appears intact Problem Solving: Appears intact Safety/Judgment: Appears intact       Comprehension  Auditory Comprehension Overall Auditory Comprehension: Appears within functional limits for tasks assessed Yes/No Questions: Within Functional Limits Commands: Within Functional Limits Conversation: Complex Interfering Components: Working Field seismologist: Repetition Retail banker: Exceptions to [facility] Other Visual Recogniton/Discrimination Comments: visuo-spatial skills/executive functioning decreased Reading Comprehension Reading Status: Within funtional limits    Expression Expression Primary Mode of Expression: Verbal Verbal Expression Overall Verbal Expression: Appears within functional limits for tasks assessed Initiation: No impairment Level of Generative/Spontaneous Verbalization: Conversation Repetition: Impaired Level of Impairment: Sentence level Naming: No impairment Pragmatics: No impairment Interfering Components: Attention Non-Verbal Means  of Communication: Not applicable Written Expression Dominant Hand: Right Written Expression: Exceptions to [facility]   Oral / Motor  Oral Motor/Sensory Function Overall Oral Motor/Sensory Function: Within functional limits Motor Speech Overall Motor Speech: Appears within functional limits for tasks assessed Respiration: Within functional limits Phonation: Normal Resonance: Within functional limits Articulation: Within functional limitis Intelligibility: Intelligible Motor Planning: Witnin functional limits Motor Speech Errors: Not applicable Interfering Components: Premorbid status             Functional Assessment Tool Used: NOMS; clinical judgment Functional Limitations: Memory Memory Current Status (Y8502): At least 40 percent but less than 60 percent impaired, limited or restricted Memory Goal Status (D7412): At least 20 percent but less than 40 percent impaired, limited or restricted         Elvina Sidle, M.S., CCC-SLP 06/07/2017, 2:44 PM

## 2017-06-08 DIAGNOSIS — R7303 Prediabetes: Secondary | ICD-10-CM | POA: Diagnosis present

## 2017-06-08 DIAGNOSIS — I639 Cerebral infarction, unspecified: Secondary | ICD-10-CM | POA: Diagnosis present

## 2017-06-08 LAB — HEMOGLOBIN A1C
Hgb A1c MFr Bld: 6 % — ABNORMAL HIGH (ref 4.8–5.6)
MEAN PLASMA GLUCOSE: 126 mg/dL

## 2017-06-08 NOTE — Progress Notes (Signed)
Progress Note    OSBY SWEETIN  WNU:[id number] DOB: 08-07-1946  DOA: 06/06/2017 PCP: [facility]    Brief Narrative:   Chief complaint: Follow-up weakness/falls  Medical records reviewed and are as summarized below:  ABBAS BEYENE is an 71 y.o. 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 During that admission, he was treated for Citrobacter UTI and was discharged home. He was brought to the hospital again 06/06/17 with a chief complaint of left-sided weakness after suffering from a fall at home several days ago.  Assessment/Plan:   Principal Problem:   Failure to thrive in adult with physical deconditioning, altered mental status, fall at home, generalized weakness rule out acute CVA in a patient with advanced microvascular disease on imaging Full stroke workup initiated. Neurologist consulting.Continue aspirin, statin. 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 MRA negative for proximal stenosis. Carotid Dopplers show 1-39 percent internal carotid artery stenosis bilaterally. 2-D echo showed EF 60-65 percent, grade 1 diastolic dysfunction, no embolic source.PT/OT/ST evaluations requested. Will need outpatient speech therapy. OT is recommending SNF. Cholesterol 87, LDL 33. Hemoglobin A1c 6%.  Active Problems:   Prediabetes Hemoglobin A1c is 6%. We'll consult with dietitian for diet  education.    Left hand and forearm dystonia Outpatient neurology follow-up recommended by neurologist.    Mild cognitive impairment Appears to have capacity per prior psychiatric evaluation despite the fact that it is clear that he cannot adequately care for himself at home. Social worker will need to be consulted to evaluate home situation.     Hypokalemia Potassium added to IV fluids.    Impaired mobility and ADLs/Spinal stenosis of lumbar region without neurogenic claudication/Weakness PT/OT evaluations requested.    Accelerated hypertension Patient does have significant hypertension, and may have hypertensive encephalopathy. Would allow some permissive hypertension until stroke ruled out. Hold antihypertensives, his blood pressure is on the low side this morning.     Lower extremity edema Albumin normal. Normal EF. Appears to be chronic. Bilateral, so doubt DVT, and has had venous dopplers in the past which were negative.   Family Communication/Anticipated D/C date and plan/Code Status   DVT prophylaxis: Lovenox ordered. Code Status: Partial code.  Family Communication: No family present. Disposition Plan: SNF if patient will agree, otherwise home with home health services.   Medical Consultants:    Neurology   Anti-Infectives:    None  Subjective:   Denies headache.  Says he has a problem with "sore joints". Denies coughing or choking episodes with oral intake.   Objective:    Vitals:   06/07/17 1737 06/07/17 2056 06/08/17 0046 06/08/17 0500  BP: 125/84 131/82 119/68 (!) 151/85  Pulse: 94 96 81 78  Resp: 18 18 18 18   Temp: 98.8 F (37.1 C) 98.8 F (37.1 C) 98.8 F (37.1 C) 98.8 F (37.1 C)  TempSrc: Oral Oral Oral Axillary  SpO2: 99% 98% 98% 98%  Weight:      Height:  Intake/Output Summary (Last 24 hours) at 06/08/17 0800 Last data filed at 06/07/17 1700  Gross per 24 hour  Intake             1280 ml  Output             1000 ml  Net               280 ml   Filed Weights   06/06/17 1307 06/06/17 2029  Weight: 86.2 kg (190 lb) 87.1 kg (192 lb 1.6 oz)    Exam: Unchanged from 06/07/17 except as noted in red: General exam: Awake, alert, conversant. Not in distress. Respiratory system: Lungs are clear to auscultation.  Cardiovascular system: Heart sounds are regular. No murmurs, rubs or gallops.  Gastrointestinal system: Abdomen is soft, non-tender, non-distended with normal active bowel sounds.  Central nervous system: Alert and oriented to self and place. ? Left lip droop, left hand with dystonia. Speech is clear but perseverates and gets fixated on things that were told to him with very little understanding. Extremities: 2+ edema. No clubbing or cyanosis. Skin: No rashes. Warm and dry. Psychiatry: Mood and affect flat. Insight very impaired. Poor judgement.   Data Reviewed:   I have personally reviewed following labs and imaging studies:  Labs: Labs show the following:   Basic Metabolic Panel:  Recent Labs Lab 06/06/17 1358 06/06/17 1430  NA 140 141  K 3.2* 3.2*  CL 99* 96*  CO2 31  --   GLUCOSE 101* 101*  BUN 10 11  CREATININE 1.05 0.90  CALCIUM 9.1  --    GFR Estimated Creatinine Clearance: 82.3 mL/min (by C-G formula based on SCr of 0.9 mg/dL). Liver Function Tests:  Recent Labs Lab 06/06/17 1358  AST 95*  ALT 28  ALKPHOS 57  BILITOT 1.3*  PROT 6.7  ALBUMIN 3.6   Coagulation profile  Recent Labs Lab 06/06/17 1358  INR 1.28    CBC:  Recent Labs Lab 06/06/17 1358 06/06/17 1430  WBC 6.2  --   NEUTROABS 4.4  --   HGB 13.5 15.3  HCT 42.7 45.0  MCV 84.6  --   PLT 216  --    Cardiac Enzymes: CBG:  Recent Labs Lab 06/06/17 1919  GLUCAP 91   Lipid Profile:  Recent Labs  06/07/17 0407  CHOL 87  HDL 45  LDLCALC 33  TRIG 44  CHOLHDL 1.9    Microbiology Recent Results (from the past 240 hour(s))  Urine culture     Status: Abnormal   Collection Time: 05/31/17 12:04 AM   Result Value Ref Range Status   Specimen Description URINE, RANDOM  Final   Special Requests NONE  Final   Culture <10,000 COLONIES/mL INSIGNIFICANT GROWTH (A)  Final   Report Status 06/02/2017 FINAL  Final    Procedures and diagnostic studies:  Ct Head Wo Contrast  Result Date: 06/06/2017 CLINICAL DATA:  Left-sided weakness EXAM: CT HEAD WITHOUT CONTRAST TECHNIQUE: Contiguous axial images were obtained from the base of the skull through the vertex without intravenous contrast. COMPARISON:  05/02/2017 FINDINGS: Brain: Mild atrophic and chronic white matter ischemic changes are seen. No findings to suggest acute hemorrhage, acute infarction or space-occupying mass lesion are noted. Vascular: No hyperdense vessel or unexpected calcification. Skull: Normal. Negative for fracture or focal lesion. Sinuses/Orbits: No acute finding. Other: None. IMPRESSION: Chronic atrophic and ischemic changes without acute abnormality. Electronically Signed   By: Inez Catalina M.D.   On: 06/06/2017 14:24  Mr Brain Wo Contrast  Result Date: 06/07/2017 CLINICAL DATA:  Stroke and confusion.  Stroke.  Frequent falls. EXAM: MRI HEAD WITHOUT CONTRAST MRA HEAD WITHOUT CONTRAST TECHNIQUE: Multiplanar, multiecho pulse sequences of the brain and surrounding structures were obtained without intravenous contrast. Angiographic images of the head were obtained using MRA technique without contrast. COMPARISON:  None. FINDINGS: MRI HEAD FINDINGS Brain: A punctate non hemorrhagic infarct is present within the posterior right temporal or occipital lobe. No other acute infarct is present. Mild generalized atrophy and white matter changes are present bilaterally. The ventricles are proportionate to the degree of atrophy. No significant extra-axial fluid collection is present. Vascular: Flow is present in the major intracranial arteries. Skull and upper cervical spine: The skullbase is within normal limits. Craniocervical junction is normal.  Midline sagittal structures are unremarkable. Sinuses/Orbits: The paranasal sinuses and mastoid air cells are clear. The globes and orbits are within normal limits. MRA HEAD FINDINGS Internal carotid arteries are within normal limits from the high cervical segments through the ICA termini bilaterally. The A1 and M1 segments are normal. The MCA bifurcations are intact. There is mild attenuation of distal ACA and MCA branch vessels. The vertebral arteries are codominant. The basilar artery is within normal limits. Both posterior cerebral arteries originate from basilar tip. The PCA branch vessels are within normal limits bilaterally. IMPRESSION: 1. 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 2. No other acute intracranial abnormality. 3. Mild diffuse atrophy and white matter disease, somewhat advanced for age. 4. Normal variant MRA circle of Willis. No significant proximal stenosis, aneurysm, or branch vessel occlusion. Electronically Signed   By: San Morelle M.D.   On: 06/07/2017 13:22   Mr Jodene Nam Head/brain LZ Cm  Result Date: 06/07/2017 CLINICAL DATA:  Stroke and confusion.  Stroke.  Frequent falls. EXAM: MRI HEAD WITHOUT CONTRAST MRA HEAD WITHOUT CONTRAST TECHNIQUE: Multiplanar, multiecho pulse sequences of the brain and surrounding structures were obtained without intravenous contrast. Angiographic images of the head were obtained using MRA technique without contrast. COMPARISON:  None. FINDINGS: MRI HEAD FINDINGS Brain: A punctate non hemorrhagic infarct is present within the posterior right temporal or occipital lobe. No other acute infarct is present. Mild generalized atrophy and white matter changes are present bilaterally. The ventricles are proportionate to the degree of atrophy. No significant extra-axial fluid collection is present. Vascular: Flow is present in the major intracranial arteries. Skull and upper cervical spine: The skullbase is  within normal limits. Craniocervical junction is normal. Midline sagittal structures are unremarkable. Sinuses/Orbits: The paranasal sinuses and mastoid air cells are clear. The globes and orbits are within normal limits. MRA HEAD FINDINGS Internal carotid arteries are within normal limits from the high cervical segments through the ICA termini bilaterally. The A1 and M1 segments are normal. The MCA bifurcations are intact. There is mild attenuation of distal ACA and MCA branch vessels. The vertebral arteries are codominant. The basilar artery is within normal limits. Both posterior cerebral arteries originate from basilar tip. The PCA branch vessels are within normal limits bilaterally. IMPRESSION: 1. 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 2. No other acute intracranial abnormality. 3. Mild diffuse atrophy and white matter disease, somewhat advanced for age. 4. Normal variant MRA circle of Willis. No significant proximal stenosis, aneurysm, or branch vessel occlusion. Electronically Signed   By: San Morelle M.D.   On: 06/07/2017 13:22    Medications:   . aspirin  300 mg Rectal Daily   Or  . aspirin  325 mg Oral Daily  . enoxaparin (LOVENOX) injection  40 mg Subcutaneous Q24H  . QUEtiapine  50-100 mg Oral QHS   Continuous Infusions: . 0.9 % NaCl with KCl 40 mEq / L 100 mL/hr (06/07/17 0825)    Medical decision making is of high complexity and this patient is at high risk of deterioration, therefore this is a level 3 visit.  (> 4 problem points, 1 data point, high risk: Need 2 out of 3)  Problems/DDx Points   Self limited or minor (max 2)       1   Established problem, stable       1  4+  Established problem, worsening       2   New problem, no additional W/U planned (max 1)       3   New problem, additional W/U planned        4     Data Reviewed Points   Review/order clinical lab tests       1  1  Review/order x-rays       1    Review/order tests (Echo, EKG, PFTs, etc)       1   Discussion of test results w/ performing MD       1   Independent review of image, tracing or specimen       2    Decision to obtain old records       1   Review and summation of old records       2    Level of risk Presenting prob Diagnostics Management   Minimal 1 self limited/minor Labs CXR EKG/EEG U/A U/S Rest Gargles Bandages Dressings   Low 2 or more self limited/minor 1 stable chronic Acute uncomplicated illness Tests (PFTS) Non-CV imaging Arterial labs Biopsies of skin OTC drugs Minor surgery-no risk PT OT IVF without additives    Moderate 1 or more chronic illnesses w/ mild exac, progression or S/E from tx 2 or more stable chronic illnesses Undiagnosed new problem w/ uncertain prognosis Acute complicated injury  Stress tests Endoscopies with no risk factors Deep needle or incisional bx CV imaging without risk LP Thoracentesis Paracentesis Minor surgery w/ risks Elective major surgery w/ no risk (open, percutaneous or endoscopic) Prescription drugs Therapeutic nucl med IVF with additives Closed tx of fracture/dislocation    High Severe exac of chronic illness Acute or chronic illness/injury may pose a threat to life or bodily function (ARF) Change in neuro status    CV imaging w/ contrast and risk Cardio electophysiologic tests Endoscopies w/ risk Discography Elective major surgery Emergency major surgery Parenteral controlled substances Drug therapy req monitoring for toxicity DNR/de-escalation of care    MDM Prob points Data points Risk   Straightforward    <1    <1    Min   Low complexity    2    2    Low   Moderate    3    3    Mod   High Complexity    4 or more    4 or more    High      LOS: 1 day   RAMA,CHRISTINA  [facility] Pager [phone]. If unable to reach me by pager, please call my cell phone at [phone].  *Please refer to [email or web address], password [email or web address] to get updated  schedule on who will round on this patient, as hospitalists  switch teams weekly. If 7PM-7AM, please contact night-coverage at [email or web address], password [email or web address] for any overnight needs.  06/08/2017, 8:00 AM

## 2017-06-08 NOTE — Evaluation (Signed)
Physical Therapy Evaluation Patient Details Name: Evan Charles MRN: [id number] DOB: 1946/10/04 Today's Date: 06/08/2017   History of Present Illness  Pt is an 71 y.o. male with a history of spinal stenosis, seizures, visual hallucinations, and dementia, who presented to [facility] with a 3 day history of left sided weakness, recent fall, cognitive worsening. Exam reveals left hemiparesis, cognitive impairment and intermittent low-amplitude myoclonic jerks.  Clinical Impression  Pt is weak (especially noted in left upper extremity) with high fall risk requiring min/mod assist for transfers and short distance gait.  He would benefit from SNF level rehab at discharge.   PT to follow acutely for deficits listed below.       Follow Up Recommendations SNF    Equipment Recommendations  None recommended by PT    Recommendations for Other Services   NA    Precautions / Restrictions Precautions Precautions: Fall      Mobility  Bed Mobility               General bed mobility comments: Pt OOB in the chair and wants to stay in the chair.   Transfers Overall transfer level: Needs assistance Equipment used: Rolling walker (2 wheeled) Transfers: Sit to/from Stand Sit to Stand: Mod assist;+2 physical assistance         General transfer comment: Mod assist of two to stand twice on the steady, pt got to min assist from elevated BSC, so PT switched out to RW and pt with posterior preference and mod assist to get up from recliner chair.   Ambulation/Gait Ambulation/Gait assistance: Min assist Ambulation Distance (Feet): 20 Feet Assistive device: Rolling walker (2 wheeled) Gait Pattern/deviations: Step-through pattern;Decreased stride length;Shuffle     General Gait Details: Pt with posterior lean initially, cues and assist needed to steady trunk while pt got his feet up under himself in standing with RW.  Heavy min assist to walk 20' to the door and back with RW.  Pt nervous that his  left hand would slip off of the walker handle, but it did not.       Modified Rankin (Stroke Patients Only) Modified Rankin (Stroke Patients Only) Pre-Morbid Rankin Score: Moderate disability Modified Rankin: Moderately severe disability     Balance Overall balance assessment: Needs assistance Sitting-balance support: Bilateral upper extremity supported;Feet supported Sitting balance-Leahy Scale: Poor Sitting balance - Comments: posterior lean requring assist to help bring trunk up off of the back of the recliner chair. Postural control: Posterior lean Standing balance support: Bilateral upper extremity supported Standing balance-Leahy Scale: Poor Standing balance comment: needs assist from therapist and standing frame                             Pertinent Vitals/Pain Pain Assessment: 0-10 Pain Score: 7  Pain Location: left shoulder Pain Descriptors / Indicators: Aching;Burning Pain Intervention(s): Limited activity within patient's tolerance;Monitored during session;Repositioned    Home Living Family/patient expects to be discharged to:: Private residence Living Arrangements: Alone Available Help at Discharge: Family;Friend(s);Available PRN/intermittently Type of Home: House Home Access: Stairs to enter Entrance Stairs-Rails: Psychiatric nurse of Steps: 6 Home Layout: One level Home Equipment: Walker - 2 wheels;Cane - single point;Bedside commode;Shower seat;Wheelchair - manual Additional Comments: information from previous admission as pt cannot recall details    Prior Function Level of Independence: Needs assistance   Gait / Transfers Assistance Needed: RW for gait at times  ADL's / Homemaking Assistance Needed: nephew helps with  errands and appts, food from Meals on [place]   Dominant Hand: Right    Extremity/Trunk Assessment   Upper Extremity Assessment Upper Extremity Assessment: Defer to OT evaluation     Lower Extremity Assessment Lower Extremity Assessment: RLE deficits/detail;LLE deficits/detail RLE Deficits / Details: generally weak, but at least 3/5 grossly ankle knee and hip, no signs functionally of inequality, however, pt has difficulty powering up and supporting his weight when on his feet.  LLE Deficits / Details: generally weak, but at least 3/5 grossly ankle knee and hip, no signs functionally of inequality, however, pt has difficulty powering up and supporting his weight when on his feet. Despite left arm weakness, left leg does not seem to be significantly weaker.     Cervical / Trunk Assessment Cervical / Trunk Assessment: Other exceptions Cervical / Trunk Exceptions: chronic low back pain  Communication   Communication: No difficulties  Cognition Arousal/Alertness: Awake/alert Behavior During Therapy: WFL for tasks assessed/performed Overall Cognitive Status: Impaired/Different from baseline Area of Impairment: Attention;Following commands;Safety/judgement;Awareness;Problem solving                   Current Attention Level: Selective   Following Commands: Follows one step commands with increased time Safety/Judgement: Decreased awareness of safety Awareness: Emergent Problem Solving: Slow processing;Requires verbal cues;Requires tactile cues;Difficulty sequencing General Comments: Pt thought he knew me from yesterday.  I informed him that we had never met.               Assessment/Plan    PT Assessment Patient needs continued PT services  PT Problem List Decreased strength;Decreased activity tolerance;Decreased balance;Decreased mobility;Decreased cognition;Decreased coordination;Decreased knowledge of use of DME;Decreased safety awareness;Decreased knowledge of precautions;Pain       PT Treatment Interventions DME instruction;Gait training;Functional mobility training;Therapeutic activities;Therapeutic exercise;Balance training;Cognitive  remediation;Neuromuscular re-education;Patient/family education    PT Goals (Current goals can be found in the Care Plan section)  Acute Rehab PT Goals Patient Stated Goal: to get stronger PT Goal Formulation: With patient Time For Goal Achievement: 06/22/17 Potential to Achieve Goals: Good    Frequency Min 3X/week           AM-PAC PT "6 Clicks" Daily Activity  Outcome Measure Difficulty turning over in bed (including adjusting bedclothes, sheets and blankets)?: Total Difficulty moving from lying on back to sitting on the side of the bed? : Total Difficulty sitting down on and standing up from a chair with arms (e.g., wheelchair, bedside commode, etc,.)?: Total Help needed moving to and from a bed to chair (including a wheelchair)?: A Little Help needed walking in hospital room?: A Little Help needed climbing 3-5 steps with a railing? : A Lot 6 Click Score: 11    End of Session Equipment Utilized During Treatment: Gait belt Activity Tolerance: Patient tolerated treatment well Patient left: in chair;with call bell/phone within reach;with chair alarm set Nurse Communication: Mobility status PT Visit Diagnosis: Muscle weakness (generalized) (M62.81);Difficulty in walking, not elsewhere classified (R26.2);Pain Pain - Right/Left: Left Pain - part of body: Shoulder    Time: 4196-2229 PT Time Calculation (min) (ACUTE ONLY): 38 min   Charges:         Wells Guiles B. Fendi Meinhardt, PT, DPT [phone]   PT Evaluation $PT Eval Moderate Complexity: 1 Procedure PT Treatments $Gait Training: 8-22 mins $Therapeutic Activity: 8-22 mins   06/08/2017, 2:54 PM

## 2017-06-08 NOTE — Progress Notes (Signed)
STROKE TEAM PROGRESS NOTE   HISTORY OF PRESENT ILLNESS (per record) Evan Charles is an 71 y.o. male with a history of chronic mental illness/dementia, frequent falls, seizures, spinal stenosis, and hypertension, who presented to [facility] 06/06/2017 with a 3 day history of left upper and lower extremity weakness. He has also had 2 falls over the past 3 days, without injuries noted by family or EMS. He stated that he "can't walk right" and was dropping things when he tried to pick them up with his left hand. The subtype of the dementia has not been diagnosed. Of note, he has waxing and waning levels of cognition with "good days and bad days" per his relative. He also has formed visual hallucinations of people, which has become less responsive to antipsychotics over time. His relative is unsure if the patient thrashes around in his sleep or acts out his dreams. The patient has had shuffling gait and slow movements which have progressed gradually over the last 2 years - his relative had less contact with the patient before then and is unsure when the dementia or movement problems first started. BP 182/107.  Head CT obtained in the ED revealed no acute abnormality. Chronic atrophic and ischemic changes are noted.  Patient was not administered IV t-PA secondary to arriving outside of the treatment window. He was admitted to General Neurology for further evaluation and treatment.   SUBJECTIVE (INTERVAL HISTORY) He is alone in the room.  The patient is awake, orientated but forgetful, follow commands appropriately.  The patient's left hand and forearm are still weak.   OBJECTIVE Temp:  [98.1 F (36.7 C)-98.8 F (37.1 C)] 98.1 F (36.7 C) (07/28 0909) Pulse Rate:  [78-96] 93 (07/28 0909) Cardiac Rhythm: Normal sinus rhythm (07/28 0700) Resp:  [18-20] 20 (07/28 0909) BP: (102-151)/(68-85) 102/69 (07/28 0909) SpO2:  [96 %-99 %] 97 % (07/28 0909)  CBC:   Recent Labs Lab 06/06/17 1358 06/06/17 1430   WBC 6.2  --   NEUTROABS 4.4  --   HGB 13.5 15.3  HCT 42.7 45.0  MCV 84.6  --   PLT 216  --     Basic Metabolic Panel:   Recent Labs Lab 06/06/17 1358 06/06/17 1430  NA 140 141  K 3.2* 3.2*  CL 99* 96*  CO2 31  --   GLUCOSE 101* 101*  BUN 10 11  CREATININE 1.05 0.90  CALCIUM 9.1  --     Lipid Panel:     Component Value Date/Time   CHOL 87 06/07/2017 0407   TRIG 44 06/07/2017 0407   HDL 45 06/07/2017 0407   CHOLHDL 1.9 06/07/2017 0407   VLDL 9 06/07/2017 0407   LDLCALC 33 06/07/2017 0407   HgbA1c:  Lab Results  Component Value Date   HGBA1C 6.0 (H) 06/07/2017   Urine Drug Screen:     Component Value Date/Time   LABOPIA NONE DETECTED 06/06/2017 1628   COCAINSCRNUR NONE DETECTED 06/06/2017 1628   LABBENZ NONE DETECTED 06/06/2017 1628   AMPHETMU NONE DETECTED 06/06/2017 1628   THCU NONE DETECTED 06/06/2017 1628   LABBARB NONE DETECTED 06/06/2017 1628    Alcohol Level     Component Value Date/Time   ETH <5 06/06/2017 1358    IMAGING I have personally reviewed the radiological images below and agree with the radiology interpretations.  Ct Head Wo Contrast 06/06/2017 Chronic atrophic and ischemic changes without acute abnormality.  Mr Brain Wo Contrast Mr Jodene Nam Head/brain Wo Cm 06/07/2017 1. Punctate nonhemorrhagic  white matter infarct in the posterior right temporal or occipital lobe.  2. No other acute intracranial abnormality.  3. Mild diffuse atrophy and white matter disease, somewhat advanced for age.  4. Normal variant MRA circle of Willis. No significant proximal stenosis, aneurysm, or branch vessel occlusion.  Carotid US 06/07/2017 Findings suggest 1-39% internal carotid artery stenosis bilaterally. Vertebral arteries are patent with antegrade flow.  2D Echo 06/07/2017 Study Conclusions - Left ventricle: The cavity size was normal. Wall thickness was increased in a pattern of moderate LVH. Systolic function was normal. The estimated ejection  fraction was in the range of 60% to 65%. Wall motion was normal; there were no regional wall motion abnormalities. Doppler parameters are consistent with abnormal left ventricular relaxation (grade 1 diastolic dysfunction). THe E/e&' ratio is between 8-15 suggesting indeterminate LV filling pressure. - Left atrium: The atrium was normal in size. - Atrial septum: No defect or patent foramen ovale was identified. - Inferior vena cava: The vessel was normal in size. The respirophasic diameter changes were in the normal range (>= 50%), consistent with normal central venous pressure. Impressions: - LVEF 60-65%, moderate LVH, normal wall motion, grade 1 DD, indeterminate LV filling pressure, normal LA size, normal IVC.   PHYSICAL EXAM Frail elderly African-American male currently not in distress. . Afebrile. Head is nontraumatic. Neck is supple without bruit.    Cardiac exam no murmur or gallop. Lungs are clear to auscultation. Distal pulses are well felt. Neurological Exam :  Awake alert oriented 2. Diminished attention, registration and recall. Follows midline and simple one-step commands. Extraocular movements are pharyngeal or nystagmus. No dysarthria or aphasia. Pupils equal reactive. Fundi were not visualized. No facial weakness. Tongue midline. Motor system exam reveals no upper or lower extremity drift but he has left wrist and left hand weakness with dexterity difficulty. Mild weakness of left hand and intrinsic hand muscles. Deep tendon reflexes are 2+ symmetric plantars are downgoing. Sensation symmetrical and coordination intact. Gait was not tested.   ASSESSMENT/PLAN Evan Charles is a 71 y.o. male with history of chronic mental illness/dementia, frequent falls, seizures, spinal stenosis, and hypertension, who presented to [facility] 06/06/2017 with a 3 day history of left upper and lower extremity weakness. He did not receive IV t-PA due to arriving outside of the treatment window.    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 No source of embolus  LDL 33  HgbA1c - 6.0  UDS negative  SCDs for VTE prophylaxis Diet Heart Room service appropriate? Yes; Fluid consistency: Thin  No antithrombotic prior to admission, now on aspirin 325 mg daily. Continue ASA on discharge.  Patient counseled to be compliant with his antithrombotic medications  Ongoing aggressive stroke risk factor management  Therapy recommendations:  SNF   Disposition:  pending  Hypertension  Stable  Permissive hypertension (OK if < 220/120) but gradually normalize in 5-7 days  Long-term BP goal normotensive  Other Stroke Risk Factors  Advanced age  Other Active Problems  Dementia - mild  Mental illness - on seroquel  RBBB  Hospital day # 1  Neurology will sign off. Please call with questions. Pt will follow up with Cecille Rubin, NP, at [facility] in about 6 weeks.  Thanks for the consult.  Rosalin Hawking, MD PhD Stroke Neurology 06/08/2017 3:26 PM  To contact Stroke Continuity provider, please refer to [email or web address] After hours, contact General Neurology

## 2017-06-08 NOTE — Progress Notes (Signed)
Nutrition Brief Note  Wt Readings from Last 15 Encounters:  06/06/17 192 lb 1.6 oz (87.1 kg)  05/31/17 190 lb (86.2 kg)  05/29/17 190 lb (86.2 kg)  05/06/17 179 lb 12.5 oz (81.5 kg)  05/01/17 175 lb 3.2 oz (79.5 kg)  03/16/17 185 lb (83.9 kg)  11/02/16 188 lb (85.3 kg)  10/23/16 188 lb (85.3 kg)  09/12/16 190 lb 4.1 oz (86.3 kg)  07/25/16 187 lb (84.8 kg)  04/17/16 184 lb 3.2 oz (83.6 kg)  04/03/16 184 lb (83.5 kg)  03/28/16 184 lb 6 oz (83.6 kg)  03/21/16 197 lb 6.4 oz (89.5 kg)  01/23/16 186 lb (84.4 kg)    Body mass index is 28.37 kg/m.   Current diet order is Heart Healthy, patient is consuming approximately 100% of meals at this time and reports he is hungry constantly. Discussed sending snacks between meals so that pt can have access to healthy snacks when  Labs and medications reviewed.   Briefly reviewed overall healthy diet setting of pre-DM. Unsure of pt's ability to retain information given pt's hx of mild cognitive impairment (chronic), dementia.   No nutrition interventions warranted at this time. If nutrition issues arise, please consult RD.   Kerman Passey MS, RD, LDN [phone] Pager  [phone] Weekend/On-Call Pager

## 2017-06-09 LAB — BASIC METABOLIC PANEL
Anion gap: 6 (ref 5–15)
BUN: 11 mg/dL (ref 6–20)
CHLORIDE: 106 mmol/L (ref 101–111)
CO2: 27 mmol/L (ref 22–32)
Calcium: 8.5 mg/dL — ABNORMAL LOW (ref 8.9–10.3)
Creatinine, Ser: 1.02 mg/dL (ref 0.61–1.24)
GFR calc non Af Amer: >60 mL/min (ref >60)
Glucose, Bld: 106 mg/dL — ABNORMAL HIGH (ref 65–99)
POTASSIUM: 4.2 mmol/L (ref 3.5–5.1)
SODIUM: 139 mmol/L (ref 135–145)

## 2017-06-09 MED ORDER — POLYETHYLENE GLYCOL 3350 17 G PO PACK: 17.0000 g | PACK | Freq: Every day | ORAL | Status: DC | PRN

## 2017-06-09 MED ORDER — SENNA 8.6 MG PO TABS
1.0000 | ORAL_TABLET | Freq: Every day | ORAL | Status: DC
  Administered 2017-06-09 – 2017-06-10 (×2): 8.6 mg via ORAL
  Filled 2017-06-09 (×2): qty 1

## 2017-06-09 MED ORDER — POLYETHYLENE GLYCOL 3350 17 G PO PACK: 17.0000 g | PACK | Freq: Every day | ORAL | Status: DC

## 2017-06-09 NOTE — NC FL2 (Signed)
[place] LEVEL OF CARE SCREENING TOOL     IDENTIFICATION  Patient Name: Evan Charles Birthdate: June 20, 1946 Sex: male Admission Date (Current Location): 06/06/2017  [facility] and [place] Number:  Herbalist and Address:  [facility], [place] [address] [place], [place] [address]      Provider Number: [id number]  Attending Physician Name and Address:  Rama, Venetia Maxon, MD  Relative Name and Phone Number:       Current Level of Care: Hospital Recommended Level of Care: [place] Prior Approval Number:    Date Approved/Denied:   PASRR Number: [id number]  Discharge Plan: SNF    Current Diagnoses: Patient Active Problem List   Diagnosis Date Noted  . Prediabetes 06/08/2017  . Stroke (cerebrum) ([place])   . 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 Dystonia 06/07/2017  . Weakness 06/06/2017  . Accelerated hypertension 06/06/2017  . Leg weakness 04/28/2017  . 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 Impaired mobility and ADLs   . Hypokalemia 11/05/2016  . Dementia with psychosis 09/12/2016  . Acute delirium 09/08/2016  . Normocytic anemia 09/08/2016  . Altered mental status 09/08/2016  . Fall at home   . Mild cognitive impairment 07/25/2016  . 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 Immobility 03/20/2016  . Generalized weakness 03/20/2016  . Failure to thrive in adult 03/20/2016  . Neuropathy   . Essential hypertension   . Physical deconditioning   . 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 GERD without esophagitis 12/19/2015  . Bilateral lower extremity edema 12/19/2015  . Intractable low back pain 02/14/2015  . Difficulty walking 02/14/2015  . H/O: upper GI bleed   . Back pain at L4-L5 level   . Visual hallucinations   . Psychoses     Orientation RESPIRATION BLADDER Height & Weight     Self, Place  Normal External  catheter Weight: 192 lb 1.6 oz (87.1 kg) Height:  5\' 9"  (175.3 cm)  BEHAVIORAL SYMPTOMS/MOOD NEUROLOGICAL BOWEL NUTRITION STATUS      Continent Diet (Heart Healthy / Thin Liquid)  AMBULATORY STATUS COMMUNICATION OF NEEDS Skin   Extensive Assist Verbally Normal                       Personal Care Assistance Level of Assistance  Bathing, Feeding, Dressing Bathing Assistance: Limited assistance Feeding assistance: Limited assistance Dressing Assistance: Limited assistance     Functional Limitations Info  Sight, Hearing, Speech Sight Info: Adequate Hearing Info: Adequate Speech Info: Adequate    SPECIAL CARE FACTORS FREQUENCY  PT (By licensed PT), OT (By licensed OT)     PT Frequency: 3x week OT Frequency: 3x week            Contractures Contractures Info: Not present    Additional Factors Info  Code Status, Allergies, Psychotropic Code Status Info: Partial Code Allergies Info: No Known Allergies Psychotropic Info: Seroquel         Current Medications (06/09/2017):  This is the current hospital active medication list Current Facility-Administered Medications  Medication Dose Route Frequency Provider Last Rate Last Dose  . acetaminophen (TYLENOL) tablet 650 mg  650 mg Oral Q4H PRN Rama, Venetia Maxon, MD   650 mg at 06/08/17 0272   Or  . acetaminophen (TYLENOL) solution 650 mg  650 mg Per Tube Q4H PRN Rama, Venetia Maxon, MD       Or  .  acetaminophen (TYLENOL) suppository 650 mg  650 mg Rectal Q4H PRN Rama, Venetia Maxon, MD      . aspirin suppository 300 mg  300 mg Rectal Daily Rama, Venetia Maxon, MD       Or  . aspirin tablet 325 mg  325 mg Oral Daily Rama, Venetia Maxon, MD   325 mg at 06/09/17 0953  . enoxaparin (LOVENOX) injection 40 mg  40 mg Subcutaneous Q24H Rama, Venetia Maxon, MD   40 mg at 06/08/17 2252  . ondansetron (ZOFRAN) injection 4 mg  4 mg Intravenous Q6H PRN Rama, Christina P, MD      . polyethylene glycol (MIRALAX / GLYCOLAX) packet 17 g  17 g Oral  Daily PRN Rama, Christina P, MD      . QUEtiapine (SEROQUEL) tablet 50-100 mg  50-100 mg Oral QHS Rama, Venetia Maxon, MD   50 mg at 06/08/17 2252  . senna (SENOKOT) tablet 8.6 mg  1 tablet Oral QHS Rama, Venetia Maxon, MD         Discharge Medications: Please see discharge summary for a list of discharge medications.  Relevant Imaging Results:  Relevant Lab Results:   Additional Information SSN [id number]   Barbette Or, Wanda

## 2017-06-09 NOTE — Progress Notes (Signed)
Progress Note    Evan Charles  AVW:[id number] DOB: [birth year]  DOA: 06/06/2017 PCP: [facility]    Brief Narrative:   Chief complaint: Follow-up weakness/falls  Medical records reviewed and are as summarized below:  Evan Charles is an 71 y.o. 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 During that admission, he was treated for Citrobacter UTI and was discharged home. He was brought to the hospital again 06/06/17 with a chief complaint of left-sided weakness after suffering from a fall at home several days ago.  Assessment/Plan:   Principal Problem:   Failure to thrive in adult with physical deconditioning, altered mental status, fall at home, generalized weakness rule out acute CVA in a patient with advanced microvascular disease on imaging Full stroke workup initiated. Neurologist consulting.Continue aspirin, statin. 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 MRA negative for proximal stenosis. Carotid Dopplers show 1-39 percent internal carotid artery stenosis bilaterally. 2-D echo showed EF 60-65 percent, grade 1 diastolic dysfunction, no embolic source.PT/OT recommending SNF placement for further rehabilitation. Will need outpatient speech therapy.  Cholesterol 87, LDL 33. Hemoglobin A1c 6%.  Active Problems:   Prediabetes Hemoglobin A1c is 6%. Diet education provided, but  patient noted to have limited capacity to understand teaching.    Left hand and forearm dystonia Outpatient neurology follow-up recommended by neurologist.    Mild cognitive impairment Appears to have capacity per prior psychiatric evaluation despite the fact that it is clear that he cannot adequately care for himself at home. Says he will consider SNF placement for rehabilitation. Social worker consulted to assess options.     Hypokalemia Resolved with supplementation.    Impaired mobility and ADLs/Spinal stenosis of lumbar region without neurogenic claudication/Weakness PT/OT evaluations requested.    Accelerated hypertension Patient does have significant hypertension, and may have hypertensive encephalopathy. Would allow some permissive hypertension until stroke ruled out. Hold antihypertensives, his blood pressure is on the low side this morning.     Lower extremity edema Albumin normal. Normal EF. Appears to be chronic. Bilateral, so doubt DVT, and has had venous dopplers in the past which were negative.   Family Communication/Anticipated D/C date and plan/Code Status   DVT prophylaxis: Lovenox ordered. Code Status: Partial code.  Family Communication: No family present. Disposition Plan: SNF if patient will agree, otherwise home with home health services.   Medical Consultants:    Neurology   Anti-Infectives:    None  Subjective:   Denies headache.  Says he has a problem with "sore joints". Denies coughing or choking episodes with oral intake.   Objective:    Vitals:   06/08/17 1741 06/08/17 2100 06/09/17 0445 06/09/17 0950  BP: 114/74 133/79 (!) 145/77 (!) 161/95  Pulse: 95 85 87 86  Resp: 20 20 20 20   Temp: 97.9 F (36.6 C) 98.5 F (36.9 C) 98.5 F (36.9 C) 98.2 F (36.8 C)  TempSrc: Oral Oral Oral Oral  SpO2: 99% 100% 98% 99%  Weight:  Height:        Intake/Output Summary (Last 24 hours) at 06/09/17 1330 Last data filed at 06/09/17  1039  Gross per 24 hour  Intake             1440 ml  Output             1525 ml  Net              -85 ml   Filed Weights   06/06/17 1307 06/06/17 2029  Weight: 86.2 kg (190 lb) 87.1 kg (192 lb 1.6 oz)    Exam:  General: No acute distress. Cardiovascular: Heart sounds show a regular rate, and rhythm. No gallops or rubs. No murmurs. No JVD. Lungs: Clear to auscultation bilaterally with good air movement. No rales, rhonchi or wheezes. Abdomen: Soft, nontender, nondistended with normal active bowel sounds. No masses. No hepatosplenomegaly. Neurological: Alert and oriented 2. Moves all extremities 4 with equal strength. Cranial nerves II through XII grossly intact. Left hand dystonia persists but upper extremity strength is good bilaterally. Skin: Warm and dry. No rashes or lesions. Extremities: No clubbing or cyanosis. No edema. Pedal pulses 2+. Psychiatric: Mood and affect are flat. Insight and judgment are impaired.   Data Reviewed:   I have personally reviewed following labs and imaging studies:  Labs: Labs show the following:   Basic Metabolic Panel:  Recent Labs Lab 06/06/17 1358 06/06/17 1430 06/09/17 0347  NA 140 141 139  K 3.2* 3.2* 4.2  CL 99* 96* 106  CO2 31  --  27  GLUCOSE 101* 101* 106*  BUN 10 11 11   CREATININE 1.05 0.90 1.02  CALCIUM 9.1  --  8.5*   GFR Estimated Creatinine Clearance: 72.6 mL/min (by C-G formula based on SCr of 1.02 mg/dL). Liver Function Tests:  Recent Labs Lab 06/06/17 1358  AST 95*  ALT 28  ALKPHOS 57  BILITOT 1.3*  PROT 6.7  ALBUMIN 3.6   Coagulation profile  Recent Labs Lab 06/06/17 1358  INR 1.28    CBC:  Recent Labs Lab 06/06/17 1358 06/06/17 1430  WBC 6.2  --   NEUTROABS 4.4  --   HGB 13.5 15.3  HCT 42.7 45.0  MCV 84.6  --   PLT 216  --    Cardiac Enzymes: CBG:  Recent Labs Lab 06/06/17 1919  GLUCAP 91   Lipid Profile:  Recent Labs  06/07/17 0407  CHOL 87  HDL 45  LDLCALC 33  TRIG 44   CHOLHDL 1.9    Microbiology Recent Results (from the past 240 hour(s))  Urine culture     Status: Abnormal   Collection Time: 05/31/17 12:04 AM  Result Value Ref Range Status   Specimen Description URINE, RANDOM  Final   Special Requests NONE  Final   Culture <10,000 COLONIES/mL INSIGNIFICANT GROWTH (A)  Final   Report Status 06/02/2017 FINAL  Final    Procedures and diagnostic studies:  No results found.  Medications:   . aspirin  300 mg Rectal Daily   Or  . aspirin  325 mg Oral Daily  . enoxaparin (LOVENOX) injection  40 mg Subcutaneous Q24H  . QUEtiapine  50-100 mg Oral QHS  . senna  1 tablet Oral QHS   Continuous Infusions: . 0.9 % NaCl with KCl 40 mEq / L 100 mL/hr (06/09/17 0700)    Medical decision making is of Moderate complexity, no new changes in neurological status and patient is stable and awaiting SNF placement. He  has 4+ chronic problems in addition to his new stroke.   LOS: 2 days   Ceyda Peterka  [facility] Pager [phone]. If unable to reach me by pager, please call my cell phone at [phone].  *Please refer to [email or web address], password [email or web address] to get updated schedule on who will round on this patient, as hospitalists switch teams weekly. If 7PM-7AM, please contact night-coverage at [email or web address], password [email or web address] for any overnight needs.  06/09/2017, 1:30 PM

## 2017-06-10 NOTE — Progress Notes (Signed)
CSW met with patient multiple times today to discuss SNF placement and to get the patient to make a decision on where he wanted to go. Patient is agreeable to go to SNF, but was refusing to discuss where he wanted.   Patient agreed to allow CSW to contact patient's nephew, [name], by phone, to discuss options. Patient's nephew indicated that the only facility that he knew of, other than the one across the road from the patient's house [facility]), is [facility], because the patient's son-in-law had been there. CSW provided information to patient, but the patient said he hadn't been impressed with that one.  Patient indicated that he wanted to be close to home so it was convenient. CSW pulled up map of facilities and compared to patient's address. CSW provided information on [facility] being the next closest facility outside of [facility], but patient said he hadn't heard of it and wouldn't have time to research it until tomorrow because of his house projects that he had his family doing for him today.   CSW reminded patient that it would be important for him to start his rehab sooner so that he could go home sooner, but patient continued to request that CSW attempt to discuss options again tomorrow.  CSW will follow up tomorrow on determining placement for the patient.  Laveda Abbe, [place] Clinical Social Worker [phone]

## 2017-06-10 NOTE — Progress Notes (Addendum)
   Progress Note    Evan Charles  MRN:[id number] DOB: 02/22/1946  DOA: 06/06/2017 PCP: [facility], [facility]    Brief Narrative:   Chief complaint: Follow-up weakness/falls  Medical records reviewed and are as summarized below:  Evan Charles is an 71 y.o. 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 During that admission, he was treated for Citrobacter UTI and was discharged home. He was brought to the hospital again 06/06/17 with a chief complaint of left-sided weakness after suffering from a fall at home several days ago.  Assessment/Plan:   Principal Problem:   Failure to thrive in adult with physical deconditioning, altered mental status, fall at home, generalized weakness rule out acute CVA in a patient with advanced microvascular disease on imaging Full stroke workup initiated. Neurologist consulting.Continue aspirin, statin. 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 MRA negative for proximal stenosis. Carotid Dopplers show 1-39 percent internal carotid artery stenosis bilaterally. 2-D echo showed EF 60-65 percent, grade 1 diastolic dysfunction, no embolic source.PT/OT recommending SNF placement for further rehabilitation. Will need outpatient speech therapy.  Cholesterol 87, LDL 33. Hemoglobin A1c 6%. Met with patient and his daughter today to discuss placement options. He continues to  agree to placement.  Active Problems:   Prediabetes Hemoglobin A1c is 6%. Diet education provided, but patient noted to have limited capacity to understand teaching.    Left hand and forearm dystonia Outpatient neurology follow-up recommended by neurologist.    Mild cognitive impairment Appears to have capacity per prior psychiatric evaluation despite the fact that it is clear that he cannot adequately care for himself at home. He agrees to SNF placement for rehabilitation. Social worker consulted to assess options.     Hypokalemia Resolved with supplementation.    Impaired mobility and ADLs/Spinal stenosis of lumbar region without neurogenic claudication/Weakness PT/OT evaluations performed, SNF recommended.    Accelerated hypertension Blood pressure improves, runs high at times. Will need initiation of antihypertensives when blood pressure consistently elevated.     Lower extremity edema Albumin normal. Normal EF. Appears to be chronic. Bilateral, so doubt DVT, and has had venous dopplers in the past which were negative.   Family Communication/Anticipated D/C date and plan/Code Status   DVT prophylaxis: Lovenox ordered. Code Status: Partial code.  Family Communication: No family present. Disposition Plan: SNF if patient will agree, otherwise home with home health services.   Medical Consultants:    Neurology   Anti-Infectives:    None  Subjective:   No complaints. Concerned about how long he will need to stay at the rehabilitation facility, but continues to say he is willing to go. Conversations sometimes circumstantial. Appetite good. Bowels moving.  Objective:    Vitals:   06/09/17 1643 06/09/17 1800 06/10/17 0059 06/10/17 0500  BP: (!) 157/102 120/85 134/83 129/88  Pulse: 100  (!) 103 87  Resp: 20  20 20  Temp: 98.6 F (37 C)  98.6 F (37 C) 98.4 F (36.9 C)  TempSrc: Oral    Oral Oral  SpO2: 100%  98% 98%  Weight:      Height:         Intake/Output Summary (Last 24 hours) at 06/10/17 0741 Last data filed at 06/09/17 2000  Gross per 24 hour  Intake              220 ml  Output             1225 ml  Net            -1005 ml   Filed Weights   06/06/17 1307 06/06/17 2029  Weight: 86.2 kg (190 lb) 87.1 kg (192 lb 1.6 oz)    Exam:  General: No acute distress. Cardiovascular: Heart sounds show a regular rate, and rhythm. No gallops or rubs. No murmurs. No JVD. Lungs: Clear to auscultation bilaterally with good air movement. No rales, rhonchi or wheezes. Abdomen: Soft, nontender, nondistended with normal active bowel sounds. No masses. No hepatosplenomegaly. Neurological: Alert and oriented 2. Moves all extremities 4 with equal strength. Cranial nerves II through XII grossly intact. Left hand dystonia persists. Skin: Warm and dry. No rashes or lesions. Extremities: No clubbing or cyanosis. No edema. Pedal pulses 2+.  Data Reviewed:   I have personally reviewed following labs and imaging studies:  Labs: Labs show the following:   Basic Metabolic Panel:  Recent Labs Lab 06/06/17 1358 06/06/17 1430 06/09/17 0347  NA 140 141 139  K 3.2* 3.2* 4.2  CL 99* 96* 106  CO2 31  --  27  GLUCOSE 101* 101* 106*  BUN _0 CREATININE 1.05 0.90 1.02  CALCIUM 9.1  --  8.5*   GFR Estimated Creatinine Clearance: 72.6 mL/min (by C-G formula based on SCr of 1.02 mg/dL). Liver Function Tests:  Recent Labs Lab 06/06/17 1358  AST 95*  ALT 28  ALKPHOS 57  BILITOT 1.3*  PROT 6.7  ALBUMIN 3.6   Coagulation profile  Recent Labs Lab 06/06/17 1358  INR 1.28    CBC:  Recent Labs Lab 06/06/17 1358 06/06/17 1430  WBC 6.2  --   NEUTROABS 4.4  --   HGB 13.5 15.3  HCT 42.7 45.0  MCV 84.6  --   PLT 216  --    Cardiac Enzymes: CBG:  Recent Labs Lab 06/06/17 1919  GLUCAP 91   Lipid Profile: No results for input(s): CHOL, HDL, LDLCALC, TRIG, CHOLHDL, LDLDIRECT in the last 72  hours.  Microbiology No results found for this or any previous visit (from the past 240 hour(s)).  Procedures and diagnostic studies:  No results found.  Medications:   . aspirin  300 mg Rectal Daily   Or  . aspirin  325 mg Oral Daily  . enoxaparin (LOVENOX) injection  40 mg Subcutaneous Q24H  . QUEtiapine  50-100 mg Oral QHS  . senna  1 tablet Oral QHS   Continuous Infusions:   Medical decision making is of Moderate complexity, no new changes in neurological status and patient is stable and awaiting SNF placement. He has 4+ chronic problems in addition to his new stroke.   LOS: 3 days   [facility] Pager [phone]. If unable to reach me by pager, please call my cell phone at [phone].  *Please refer to [email or web address], password [email or web address] to get updated schedule on who will round on this patient, as hospitalists switch teams weekly. If 7PM-7AM, please contact night-coverage at [email or web address], password [email or web address] for any overnight needs.  06/10/2017, 7:41 AM

## 2017-06-10 NOTE — Clinical Social Work Note (Signed)
Clinical Social Work Assessment  Patient Details  Name: Evan Charles MRN: [id number] Date of Birth: 12/16/1945  Date of referral:  06/10/17               Reason for consult:  Facility Placement, Discharge Planning                Permission sought to share information with:  Facility Sport and exercise psychologist, Family Supports Permission granted to share information::  Yes, Verbal Permission Granted  Name::     Evan Charles  Agency::  SNF  Relationship::  Daughter, Therapist, art Information:     Housing/Transportation Living arrangements for the past 2 months:  Single Family Home Source of Information:  Patient Patient Interpreter Needed:  None Criminal Activity/Legal Involvement Pertinent to Current Situation/Hospitalization:  No - Comment as needed Significant Relationships:  Adult Children, Other Family Members Lives with:  Self Do you feel safe going back to the place where you live?  Yes Need for family participation in patient care:  No (Coment)  Care giving concerns:  Patient currently lives at home alone and needs assistance with mobility after a fall, will need short term rehab at discharge prior to returning home. Patient has concerns about construction projects currently happening at home that he wants to be able to check on and provide supervision, so he is nervous about going to SNF because he feels like he won't be able to care for his house while he's away.   Social Worker assessment / plan:  CSW met with patient and patient's daughter, Evan Charles, at bedside to discuss SNF placement. CSW explained recommendation for SNF, and patient acknowledged that he's still not sure if he wants to go. Patient discussed that he would rather go home with home health services instead, and CSW discussed concerns about him being at home alone with his current limitations. Patient's daughter agreed that the family wants him to go for rehab prior to returning home, and that they would keep  an eye on the house. CSW provided patient with facility list, and discussed options with him. Patient indicated he had been at [facility] before, but he did not like his stay there. Patient complained that this was being thrown on him all at once and he wasn't given time to think about it. Patient wanted to discuss with his family when they come back this afternoon, and be able to make a decision tomorrow. CSW indicated that a decision should be made today, and that the sooner he gets started on his rehab, the sooner he can get back home. CSW will follow up on facility choice this afternoon.  Employment status:  Retired Nurse, adult PT Recommendations:  [place] / Referral to community resources:  [place]  Patient/Family's Response to care:  Patient initially refused, but was agreeable to SNF placement after discussion.  Patient/Family's Understanding of and Emotional Response to Diagnosis, Current Treatment, and Prognosis:  Patient has a limited understanding of his current limitations and needs, as he does not seem concerned about his ability to navigate his house and provide his needs on his own, even though he has been requiring assistance at the hospital. Patient is hopeful that his rehab stay will be short and that he can get back home soon.  Emotional Assessment Appearance:  Appears stated age Attitude/Demeanor/Rapport:  Apprehensive Affect (typically observed):  Apprehensive Orientation:  Oriented to Self, Oriented to Place Alcohol / Substance use:  Not  Applicable Psych involvement (Current and /or in the community):  No (Comment)  Discharge Needs  Concerns to be addressed:  Care Coordination, Discharge Planning Concerns, Denies Needs/Concerns at this time Readmission within the last 30 days:  Yes Current discharge risk:  Lives alone, Physical Impairment, Cognitively Impaired Barriers to Discharge:   Continued Medical Work up   [employer], LCSW 06/10/2017, 1:10 PM

## 2017-06-10 NOTE — Progress Notes (Signed)
Physical Therapy Treatment Patient Details Name: NAIN RUDD MRN: [id number] DOB: 1946-03-21 Today's Date: 06/10/2017    History of Present Illness Pt is an 71 y.o. male with a history of spinal stenosis, seizures, visual hallucinations, and dementia, who presented to [facility] with a 3 day history of left sided weakness, recent fall, cognitive worsening. Exam reveals left hemiparesis, cognitive impairment and intermittent low-amplitude myoclonic jerks.    PT Comments    Patient reluctant to participate with therapies today. Required max encouragement from therapist and nursing. Agreeable to OOB to chair with hygiene and pericare assist. Patient very unsteady requiring 2 person assist for OOB mobility. Poor insight into deficits. Continue to recommend SNF.   Follow Up Recommendations  SNF     Equipment Recommendations  None recommended by PT    Recommendations for Other Services       Precautions / Restrictions Precautions Precautions: Fall    Mobility  Bed Mobility Overal bed mobility: Needs Assistance Bed Mobility: Supine to Sit     Supine to sit: Mod assist     General bed mobility comments: moderate assist to rotate to EOB and elevate trunk. Patient with poor ability to maintain upright. Posterior list noted when coming to EOB  Transfers Overall transfer level: Needs assistance Equipment used: Rolling walker (2 wheeled) Transfers: Sit to/from Stand Sit to Stand: Mod assist;+2 physical assistance Stand pivot transfers: Max assist;+2 physical assistance;+2 safety/equipment       General transfer comment: Significant posterior lean, inability to self correct. Heavy reliance on RW. performed x4 during session, Increased assist required during pivot to chair  Ambulation/Gait             General Gait Details: unable to perform safely today   Stairs            Wheelchair Mobility    Modified Rankin (Stroke Patients Only) Modified Rankin (Stroke  Patients Only) Pre-Morbid Rankin Score: Moderate disability Modified Rankin: Moderately severe disability     Balance Overall balance assessment: Needs assistance Sitting-balance support: Bilateral upper extremity supported;Feet supported Sitting balance-Leahy Scale: Poor Sitting balance - Comments: posterior lean requring assist to help bring trunk up off of the back of the recliner chair. Postural control: Posterior lean Standing balance support: Bilateral upper extremity supported Standing balance-Leahy Scale: Poor Standing balance comment: unable to stand without physical assist, heavy reliance on UE support                            Cognition Arousal/Alertness: Awake/alert Behavior During Therapy: Agitated Overall Cognitive Status: Impaired/Different from baseline Area of Impairment: Attention;Following commands;Safety/judgement;Awareness;Problem solving                   Current Attention Level: Selective   Following Commands: Follows one step commands with increased time Safety/Judgement: Decreased awareness of safety Awareness: Emergent Problem Solving: Slow processing;Requires verbal cues;Requires tactile cues;Difficulty sequencing General Comments: patient with poor receptivity to deficits and safety. Patient does not show insight regarding current functional levels      Exercises      General Comments        Pertinent Vitals/Pain Pain Assessment: Faces Pain Location: generalized Pain Descriptors / Indicators: Sore Pain Intervention(s): Monitored during session    Home Living                      Prior Function            PT  Goals (current goals can now be found in the care plan section) Acute Rehab PT Goals Patient Stated Goal: to get stronger PT Goal Formulation: With patient Time For Goal Achievement: 06/22/17 Potential to Achieve Goals: Good Progress towards PT goals: Not progressing toward goals - comment     Frequency    Min 3X/week      PT Plan Current plan remains appropriate    Co-evaluation              AM-PAC PT "6 Clicks" Daily Activity  Outcome Measure  Difficulty turning over in bed (including adjusting bedclothes, sheets and blankets)?: Total Difficulty moving from lying on back to sitting on the side of the bed? : Total Difficulty sitting down on and standing up from a chair with arms (e.g., wheelchair, bedside commode, etc,.)?: Total Help needed moving to and from a bed to chair (including a wheelchair)?: A Little Help needed walking in hospital room?: A Little Help needed climbing 3-5 steps with a railing? : A Lot 6 Click Score: 11    End of Session Equipment Utilized During Treatment: Gait belt Activity Tolerance: Patient tolerated treatment well Patient left: in chair;with call bell/phone within reach;with chair alarm set Nurse Communication: Mobility status PT Visit Diagnosis: Muscle weakness (generalized) (M62.81);Difficulty in walking, not elsewhere classified (R26.2);Pain Pain - Right/Left: Left Pain - part of body: Shoulder     Time: 3491-7915 PT Time Calculation (min) (ACUTE ONLY): 20 min  Charges:  $Therapeutic Activity: 8-22 mins                    G Codes:       Alben Deeds, PT DPT  Board Certified Neurologic Specialist [place] 06/10/2017, 1:38 PM

## 2017-06-11 DIAGNOSIS — Z9181 History of falling: Secondary | ICD-10-CM | POA: Diagnosis not present

## 2017-06-11 DIAGNOSIS — Z87440 Personal history of urinary (tract) infections: Secondary | ICD-10-CM | POA: Diagnosis not present

## 2017-06-11 DIAGNOSIS — G3184 Mild cognitive impairment, so stated: Secondary | ICD-10-CM | POA: Diagnosis not present

## 2017-06-11 DIAGNOSIS — R627 Adult failure to thrive: Secondary | ICD-10-CM | POA: Diagnosis not present

## 2017-06-11 DIAGNOSIS — R2681 Unsteadiness on feet: Secondary | ICD-10-CM | POA: Diagnosis not present

## 2017-06-11 DIAGNOSIS — I10 Essential (primary) hypertension: Secondary | ICD-10-CM | POA: Diagnosis not present

## 2017-06-11 DIAGNOSIS — M4807 Spinal stenosis, lumbosacral region: Secondary | ICD-10-CM | POA: Diagnosis not present

## 2017-06-11 DIAGNOSIS — K219 Gastro-esophageal reflux disease without esophagitis: Secondary | ICD-10-CM | POA: Diagnosis not present

## 2017-06-11 DIAGNOSIS — G8190 Hemiplegia, unspecified affecting unspecified side: Secondary | ICD-10-CM | POA: Diagnosis not present

## 2017-06-11 DIAGNOSIS — I69354 Hemiplegia and hemiparesis following cerebral infarction affecting left non-dominant side: Secondary | ICD-10-CM | POA: Diagnosis not present

## 2017-06-11 DIAGNOSIS — G464 Cerebellar stroke syndrome: Secondary | ICD-10-CM | POA: Diagnosis not present

## 2017-06-11 DIAGNOSIS — G3189 Other specified degenerative diseases of nervous system: Secondary | ICD-10-CM | POA: Diagnosis not present

## 2017-06-11 DIAGNOSIS — I69398 Other sequelae of cerebral infarction: Secondary | ICD-10-CM | POA: Diagnosis not present

## 2017-06-11 DIAGNOSIS — G249 Dystonia, unspecified: Secondary | ICD-10-CM | POA: Diagnosis not present

## 2017-06-11 DIAGNOSIS — I6330 Cerebral infarction due to thrombosis of unspecified cerebral artery: Secondary | ICD-10-CM | POA: Diagnosis not present

## 2017-06-11 DIAGNOSIS — R7303 Prediabetes: Secondary | ICD-10-CM | POA: Diagnosis not present

## 2017-06-11 DIAGNOSIS — I63033 Cerebral infarction due to thrombosis of bilateral carotid arteries: Secondary | ICD-10-CM | POA: Diagnosis not present

## 2017-06-11 DIAGNOSIS — R600 Localized edema: Secondary | ICD-10-CM | POA: Diagnosis not present

## 2017-06-11 DIAGNOSIS — M6281 Muscle weakness (generalized): Secondary | ICD-10-CM | POA: Diagnosis not present

## 2017-06-11 DIAGNOSIS — R488 Other symbolic dysfunctions: Secondary | ICD-10-CM | POA: Diagnosis not present

## 2017-06-11 DIAGNOSIS — I63331 Cerebral infarction due to thrombosis of right posterior cerebral artery: Secondary | ICD-10-CM | POA: Diagnosis not present

## 2017-06-11 DIAGNOSIS — R410 Disorientation, unspecified: Secondary | ICD-10-CM | POA: Diagnosis not present

## 2017-06-11 DIAGNOSIS — R1312 Dysphagia, oropharyngeal phase: Secondary | ICD-10-CM | POA: Diagnosis not present

## 2017-06-11 MED ORDER — ASPIRIN 325 MG PO TABS: 325.0000 mg | ORAL_TABLET | Freq: Every day | ORAL | Status: DC

## 2017-06-11 MED ORDER — POLYETHYLENE GLYCOL 3350 17 G PO PACK: 17.0000 g | PACK | Freq: Every day | ORAL | 0 refills | Status: DC | PRN

## 2017-06-11 MED ORDER — SENNA 8.6 MG PO TABS: 1.0000 | ORAL_TABLET | Freq: Every day | ORAL | 0 refills | Status: AC

## 2017-06-11 NOTE — Progress Notes (Signed)
Pt discharge education and instructions completed. IV and telemetry removed; pt discharge to [facility] and report called off to nurse Ashok Pall at the facility. Pt picked up by PT[facility] to be transported off to disposition. Pt transported off unit via stretcher with belongings to the side. P.[employer] RN

## 2017-06-11 NOTE — Care Management Note (Signed)
Case Management Note  Patient Details  Name: Evan Charles MRN: [id number] Date of Birth: 02/13/1946  Subjective/Objective:                    Action/Plan: Pt discharging to SNF today. No further needs per CM.   Expected Discharge Date:  06/11/17               Expected Discharge Plan:  Skilled Nursing Facility  In-House Referral:  Clinical Social Work  Discharge planning Services  CM Consult  Post Acute Care Choice:    Choice offered to:     DME Arranged:    DME Agency:     HH Arranged:    [place] Agency:     Status of Service:  Completed, signed off  If discussed at [employer], dates discussed:    Additional Comments:  Pollie Friar, RN 06/11/2017, 11:25 AM

## 2017-06-11 NOTE — Progress Notes (Signed)
Occupational Therapy Treatment Patient Details Name: NAZIR HACKER MRN: [id number] DOB: 1946-02-02 Today's Date: 06/11/2017    History of present illness Pt is a 71 y.o. male with a history of spinal stenosis, seizures, visual hallucinations, and dementia, who presented to [facility] with a 3 day history of left sided weakness, recent fall, cognitive worsening. Exam reveals left hemiparesis, cognitive impairment and intermittent low-amplitude myoclonic jerks.   OT comments  Pt demonstrating progress toward OT goals. Facilitated improved functional use of L UE during functional reaching tasks in PNF patterns and self-feeding tasks. Pt remains with decreased composite digit extension with greatest limitation in third-fifth digits limiting his ability to release items. Note plan to D/C to SNF today. OT will continue to follow while admitted.    Follow Up Recommendations  SNF;Supervision/Assistance - 24 hour    Equipment Recommendations  Other (comment) (defer to next venue of care)    Recommendations for Other Services      Precautions / Restrictions Precautions Precautions: Fall Restrictions Weight Bearing Restrictions: No       Mobility Bed Mobility               General bed mobility comments: OOB in chair on arrival.   Transfers                 General transfer comment: Session focused on L UE exercises and self-feeding tasks.     Balance                                           ADL either performed or assessed with clinical judgement   ADL Overall ADL's : Needs assistance/impaired Eating/Feeding: Minimal assistance Eating/Feeding Details (indicate cue type and reason): To feed self with L UE                                   General ADL Comments: Facilitated improved self-feeding independence with hand over hand assistance initially and progressing to min guard.      Vision   Additional Comments: Noted difficulty reaching  to target to the L.    Perception     Praxis      Cognition Arousal/Alertness: Awake/alert Behavior During Therapy: WFL for tasks assessed/performed (reporting frustration and questioning therapist ) Overall Cognitive Status: Impaired/Different from baseline Area of Impairment: Attention;Following commands;Safety/judgement;Awareness;Problem solving                   Current Attention Level: Selective   Following Commands: Follows one step commands with increased time Safety/Judgement: Decreased awareness of safety Awareness: Emergent Problem Solving: Slow processing;Requires verbal cues;Requires tactile cues;Difficulty sequencing General Comments: Pt asking multiple questions concerning current deficits and difficulty connecting deficits with diagnosis.         Exercises Exercises: Other exercises Other Exercises Other Exercises: Facilitated improved functional use of L UE for functional reaching tasks in PNF patterns this session.  Other Exercises: Facilitated improved functional use of L UE with L shoulder PROM in flexion and extension with noted increased tone in extensor groups.   Shoulder Instructions       General Comments      Pertinent Vitals/ Pain       Pain Assessment: Faces Faces Pain Scale: Hurts a little bit Pain Location: generalized Pain Descriptors / Indicators: Sore Pain Intervention(s): Monitored  during session  Home Living                                          Prior Functioning/Environment              Frequency  Min 2X/week        Progress Toward Goals  OT Goals(current goals can now be found in the care plan section)  Progress towards OT goals: Progressing toward goals  Acute Rehab OT Goals Patient Stated Goal: to get stronger OT Goal Formulation: With patient Time For Goal Achievement: 06/21/17 Potential to Achieve Goals: Good ADL Goals Pt Will Perform Eating: with modified independence;sitting;with  adaptive utensils Pt/caregiver will Perform Home Exercise Program: Increased ROM;Increased strength;Left upper extremity;With Supervision;With written HEP provided  Plan Discharge plan remains appropriate    Co-evaluation                 AM-PAC PT "6 Clicks" Daily Activity     Outcome Measure   Help from another person eating meals?: A Little Help from another person taking care of personal grooming?: A Lot Help from another person toileting, which includes using toliet, bedpan, or urinal?: A Lot Help from another person bathing (including washing, rinsing, drying)?: A Lot Help from another person to put on and taking off regular upper body clothing?: A Lot Help from another person to put on and taking off regular lower body clothing?: Total 6 Click Score: 12    End of Session    OT Visit Diagnosis: Unsteadiness on feet (R26.81);Other abnormalities of gait and mobility (R26.89);Repeated falls (R29.6);Muscle weakness (generalized) (M62.81);History of falling (Z91.81);Other symptoms and signs involving cognitive function;Pain Pain - Right/Left: Left Pain - part of body: Shoulder;Arm   Activity Tolerance Patient tolerated treatment well   Patient Left in chair;with call bell/phone within reach;with chair alarm set   Nurse Communication          Time: [id number] OT Time Calculation (min): 21 min  Charges: OT General Charges $OT Visit: 1 Procedure OT Treatments $Self Care/Home Management : 8-22 mins  Norman Herrlich, MS OTR/L  Pager: [place] 06/11/2017, 12:48 PM

## 2017-06-11 NOTE — Progress Notes (Signed)
Discharge to: [facility] Anticipated discharge date:  06/11/17 Family notified: Yes, by phone Transportation by: [facility]  Report #: [phone], Room 107  CSW signing off.  Laveda Abbe LCSW [phone]

## 2017-06-11 NOTE — Consult Note (Signed)
   [facility] Inpatient Consult   06/11/2017  COLLIE WERNICK 1945/12/02 [id number]  Chart reviewed and found the patient is not eligible for [facility] Care Management services, not in [facility] registry. Primary Care Provider is not in the [place].   Natividad Brood, RN BSN [facility] Liaison  [phone] business mobile phone Toll free office [phone]

## 2017-06-11 NOTE — Care Management Important Message (Signed)
Important Message  Patient Details  Name: Evan Charles MRN: [id number] Date of Birth: [birth year]   Medicare Important Message Given:  Yes    Victorian Gunn Montine Circle 06/11/2017, 11:44 AM

## 2017-06-11 NOTE — Discharge Summary (Addendum)
Physician Discharge Summary  Evan Charles BHA:[id number] DOB: November 18, 1945 DOA: 06/06/2017  PCP: [facility]  Admit date: 06/06/2017 Discharge date: 06/11/2017  Admitted From: Home Discharge disposition: SNF   Recommendations for Outpatient Follow-Up:   1. Will need to follow up at [facility] in 6 weeks.   Discharge Diagnosis:   Principal Problem:   Stroke (cerebrum) (HCC) Active Problems:   Failure to thrive in adult   Physical deconditioning   Mild cognitive impairment   Altered mental status   Fall at home   Hypokalemia   Impaired mobility and ADLs   Spinal stenosis of lumbar region without neurogenic claudication   Weakness   Accelerated hypertension   Cerebral thrombosis with cerebral infarction   Dystonia   Prediabetes   Hypertensive urgency  Discharge Condition: Improved.  Diet recommendation: Low sodium, heart healthy.    History of Present Illness:   Evan Charles is an 71 y.o. 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 During that admission, he was treated for Citrobacter UTI and was discharged home. He was brought to the hospital again 06/06/17 with a chief complaint of left-sided weakness after suffering from a fall at home several days ago.   Hospital Course by Problem:   Principal Problem: Failure to thrive in adult with physical deconditioning, altered mental status, fall at home, generalized weakness rule out acute CVA in a patient with advanced microvascular disease on imaging MRI showed a punctate nonhemorrhagic  white matter infarct in the posterior right temporal or occipital lobe with no other acute intracranial abnormalities and diffuse atrophy/white matter disease advanced for age. Full stroke workup completed. MRA negative for proximal stenosis. Carotid Dopplers showed 1-39 percent internal carotid artery stenosis bilaterally. 2-D echo showed EF 60-65 percent, grade 1 diastolic dysfunction, no embolic source.PT/OT recommended SNF placement for further rehabilitation. Will need outpatient speech therapy.  Cholesterol 87, LDL 33. Hemoglobin A1c 6%. Continue aspirin.   Active Problems:   Prediabetes Hemoglobin A1c is 6%. Diet education provided, but patient noted to have limited capacity to understand teaching.    Left hand and forearm dystonia Outpatient neurology follow-up in 6 weeks.  Mild cognitive impairment Appears to have capacity per prior psychiatric evaluation despite the fact that it is clear that he cannot adequately care for himself at home. He agrees to SNF placement for rehabilitation.   Hypokalemia Resolved with supplementation.  Impaired mobility and ADLs/Spinal stenosis of lumbar region without neurogenic claudication/Weakness PT/OT evaluations performed, SNF recommended.  Hypertensive urgency/Accelerated hypertension Anti-hypertensives resumed.  Needs close outpatient follow up to ensure good BP control.     Lower extremity edema Albumin normal. Normal EF. Appears to be chronic. Bilateral, so doubt DVT, and has had venous dopplers in the past which were negative.   Medical Consultants:    Neurology   Discharge Exam:   Vitals:   06/11/17 0508 06/11/17 0942  BP: 118/61 140/77  Pulse: 99 83  Resp: 20 20  Temp: 98.3 F (36.8 C) 98.4 F (36.9 C)   Vitals:   06/10/17 2059 06/11/17 0034 06/11/17 0508 06/11/17 0942  BP: 108/69 113/64 118/61 140/77  Pulse: (!) 102 98 99 83  Resp: 18 20 20 20   Temp: 97.7 F (36.5 C) 98 F (36.7 C) 98.3 F (36.8 C)  98.4 F (36.9 C)  TempSrc: Oral Oral Oral Oral  SpO2: 99% 99% 99% 99%  Weight:      Height:        General exam: Appears calm and comfortable.  Respiratory system: Clear to auscultation. Respiratory effort normal. Cardiovascular system: S1 & S2 heard, RRR. No JVD,  rubs, gallops or clicks. No murmurs. Gastrointestinal system: Abdomen is nondistended, soft and nontender. No organomegaly or masses felt. Normal bowel sounds heard. Central nervous system: Alert and oriented x 2. Has language/cognitive deficits and left hand dystonia. Extremities: No clubbing,  or cyanosis. 2+ edema. Skin: No rashes, lesions or ulcers. Psychiatry: Judgement and insight appear impaired. Mood & affect normal, strange affect at times.    The results of significant diagnostics from this hospitalization (including imaging, microbiology, ancillary and laboratory) are listed below for reference.     Procedures and Diagnostic Studies:   Ct Head Wo Contrast  Result Date: 06/06/2017 CLINICAL DATA:  Left-sided weakness EXAM: CT HEAD WITHOUT CONTRAST TECHNIQUE: Contiguous axial images were obtained from the base of the skull through the vertex without intravenous contrast. COMPARISON:  05/02/2017 FINDINGS: Brain: Mild atrophic and chronic white matter ischemic changes are seen. No findings to suggest acute hemorrhage, acute infarction or space-occupying mass lesion are noted. Vascular: No hyperdense vessel or unexpected calcification. Skull: Normal. Negative for fracture or focal lesion. Sinuses/Orbits: No acute finding. Other: None. IMPRESSION: Chronic atrophic and ischemic changes without acute abnormality. Electronically Signed   By: Inez Catalina M.D.   On: 06/06/2017 14:24   Mr Brain Wo Contrast  Result Date: 06/07/2017 CLINICAL DATA:  Stroke and confusion.  Stroke.  Frequent falls. EXAM: MRI HEAD WITHOUT CONTRAST MRA HEAD WITHOUT CONTRAST TECHNIQUE: Multiplanar, multiecho pulse sequences of the brain and surrounding  structures were obtained without intravenous contrast. Angiographic images of the head were obtained using MRA technique without contrast. COMPARISON:  None. FINDINGS: MRI HEAD FINDINGS Brain: A punctate non hemorrhagic infarct is present within the posterior right temporal or occipital lobe. No other acute infarct is present. Mild generalized atrophy and white matter changes are present bilaterally. The ventricles are proportionate to the degree of atrophy. No significant extra-axial fluid collection is present. Vascular: Flow is present in the major intracranial arteries. Skull and upper cervical spine: The skullbase is within normal limits. Craniocervical junction is normal. Midline sagittal structures are unremarkable. Sinuses/Orbits: The paranasal sinuses and mastoid air cells are clear. The globes and orbits are within normal limits. MRA HEAD FINDINGS Internal carotid arteries are within normal limits from the high cervical segments through the ICA termini bilaterally. The A1 and M1 segments are normal. The MCA bifurcations are intact. There is mild attenuation of distal ACA and MCA branch vessels. The vertebral arteries are codominant. The basilar artery is within normal limits. Both posterior cerebral arteries originate from basilar tip. The PCA branch vessels are within normal limits bilaterally. IMPRESSION: 1. 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 2. No other acute intracranial abnormality. 3. Mild diffuse atrophy and white matter disease, somewhat advanced for age. 4. Normal variant MRA circle of Willis. No significant proximal stenosis, aneurysm, or branch vessel occlusion. Electronically Signed   By: San Morelle M.D.   On: 06/07/2017 13:22   Mr Jodene Nam Head/brain SH Cm  Result Date: 06/07/2017 CLINICAL DATA:  Stroke and confusion.  Stroke.  Frequent falls. EXAM: MRI HEAD WITHOUT CONTRAST MRA HEAD WITHOUT CONTRAST TECHNIQUE: Multiplanar,  multiecho pulse sequences of the brain and surrounding structures were obtained without intravenous contrast. Angiographic images of the head were obtained using MRA technique without contrast. COMPARISON:  None. FINDINGS: MRI HEAD FINDINGS Brain: A punctate non hemorrhagic infarct is present within the posterior right temporal or occipital lobe. No other acute infarct is present. Mild generalized atrophy and white matter changes are present bilaterally. The ventricles are proportionate to the degree of atrophy. No significant extra-axial fluid collection is present. Vascular: Flow is present in the major intracranial arteries. Skull and upper cervical spine: The skullbase is within normal limits. Craniocervical junction is normal. Midline sagittal structures are unremarkable. Sinuses/Orbits: The paranasal sinuses and mastoid air cells are clear. The globes and orbits are within normal limits. MRA HEAD FINDINGS Internal carotid arteries are within normal limits from the high cervical segments through the ICA termini bilaterally. The A1 and M1 segments are normal. The MCA bifurcations are intact. There is mild attenuation of distal ACA and MCA branch vessels. The vertebral arteries are codominant. The basilar artery is within normal limits. Both posterior cerebral arteries originate from basilar tip. The PCA branch vessels are within normal limits bilaterally. IMPRESSION: 1. 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 2. No other acute intracranial abnormality. 3. Mild diffuse atrophy and white matter disease, somewhat advanced for age. 4. Normal variant MRA circle of Willis. No significant proximal stenosis, aneurysm, or branch vessel occlusion. Electronically Signed   By: San Morelle M.D.   On: 06/07/2017 13:22     Labs:   Basic Metabolic Panel:  Recent Labs Lab 06/06/17 1358 06/06/17 1430 06/09/17 0347  NA 140 141 139  K 3.2* 3.2* 4.2  CL  99* 96* 106  CO2 31  --  27  GLUCOSE 101* 101* 106*  BUN 10 11 11   CREATININE 1.05 0.90 1.02  CALCIUM 9.1  --  8.5*   GFR Estimated Creatinine Clearance: 72.6 mL/min (by C-G formula based on SCr of 1.02 mg/dL). Liver Function Tests:  Recent Labs Lab 06/06/17 1358  AST 95*  ALT 28  ALKPHOS 57  BILITOT 1.3*  PROT 6.7  ALBUMIN 3.6   Coagulation profile  Recent Labs Lab 06/06/17 1358  INR 1.28    CBC:  Recent Labs Lab 06/06/17 1358 06/06/17 1430  WBC 6.2  --   NEUTROABS 4.4  --   HGB 13.5 15.3  HCT 42.7 45.0  MCV 84.6  --   PLT 216  --    CBG:  Recent Labs Lab 06/06/17 1919  GLUCAP 91     Discharge Instructions:   Discharge Instructions    Call MD for:  difficulty breathing, headache or visual disturbances    Complete by:  As directed    Call MD for:  extreme fatigue    Complete by:  As directed    Call MD for:  persistant dizziness or light-headedness    Complete by:  As directed    Diet - low sodium heart healthy    Complete by:  As directed    Increase activity slowly    Complete by:  As directed      Allergies as of 06/11/2017   No Known Allergies     Medication List    STOP taking these medications   furosemide 80 MG tablet Commonly known as:  LASIX   pantoprazole  40 MG tablet Commonly known as:  PROTONIX   traMADol 50 MG tablet Commonly known as:  ULTRAM     TAKE these medications   amLODipine 5 MG tablet Commonly known as:  NORVASC Take 1 tablet (5 mg total) by mouth daily.   aspirin 325 MG tablet Take 1 tablet (325 mg total) by mouth daily.   docusate sodium 100 MG capsule Commonly known as:  COLACE Take 1 capsule (100 mg total) by mouth 2 (two) times daily as needed for moderate constipation.   hydrochlorothiazide 12.5 MG capsule Commonly known as:  MICROZIDE Take 1 capsule (12.5 mg total) by mouth daily.   polyethylene glycol packet Commonly known as:  MIRALAX / GLYCOLAX Take 17 g by mouth daily as needed for  mild constipation.   QUEtiapine 50 MG tablet Commonly known as:  SEROQUEL Take 1-2 tablets (50-100 mg total) by mouth at bedtime.   senna 8.6 MG Tabs tablet Commonly known as:  SENOKOT Take 1 tablet (8.6 mg total) by mouth at bedtime.      Follow-up Information    Dennie Bible, NP. Schedule an appointment as soon as possible for a visit in 6 week(s).   Specialty:  Family Medicine Why:  stroke follow up Contact information: [address] [place] [address] [facility] [place] Schedule an appointment as soon as possible for a visit.   Why:  1 week after you are discharged from rehab for hospital follow up. Contact information: [place] [address] [phone]            Time coordinating discharge: 35 minutes.  Signed:  Chez Bulnes  Pager [phone] [facility] 06/11/2017, 11:15 AM

## 2017-06-13 ENCOUNTER — Non-Acute Institutional Stay (SKILLED_NURSING_FACILITY): Payer: Medicare Other | Admitting: Internal Medicine

## 2017-06-13 ENCOUNTER — Encounter: Payer: Self-pay | Admitting: Internal Medicine

## 2017-06-13 DIAGNOSIS — R600 Localized edema: Secondary | ICD-10-CM

## 2017-06-13 DIAGNOSIS — R627 Adult failure to thrive: Secondary | ICD-10-CM

## 2017-06-13 DIAGNOSIS — I63331 Cerebral infarction due to thrombosis of right posterior cerebral artery: Secondary | ICD-10-CM | POA: Diagnosis not present

## 2017-06-13 DIAGNOSIS — I10 Essential (primary) hypertension: Secondary | ICD-10-CM | POA: Diagnosis not present

## 2017-06-13 NOTE — Assessment & Plan Note (Addendum)
BP controlled; but amlodipine may be causing edema warranting change in antihypertensive medication to low dose beta blocker

## 2017-06-13 NOTE — Assessment & Plan Note (Signed)
PT/OT at SNF Continue full dose aspirin

## 2017-06-13 NOTE — Assessment & Plan Note (Signed)
This is chronic issue which has been evaluated including venous Dopplers  continue low-dose diuretic Change amlodipine to metoprolol to see if edema is related to the amlodipine

## 2017-06-13 NOTE — Progress Notes (Signed)
NURSING HOME LOCATION:  [facility] ROOM NUMBER:  [id number]  CODE STATUS:  Full Code  PCP:  [facility])  [place] [id number]   This is a comprehensive admission note to [facility] performed on this date less than 30 days from date of admission. Included are preadmission medical/surgical history;reconciled medication list; family history; social history and comprehensive review of systems.  Corrections and additions to the records were documented . Comprehensive physical exam was also performed. Additionally a clinical summary was entered for each active diagnosis pertinent to this admission in the Problem List to enhance continuity of care.  HPI: Hospitalized 7/26-7/31/18 with stroke in the context of adult failure to thrive. The patient presented with left-sided weakness after suffering a fall in his home several days prior to admission. MRI revealed a punctate nonhemorrhagic white matter infarct in the posterior right temporal occipital lobe and diffuse atrophy/white matter disease beyond that expected for his age. MRA was negative for proximal stenosis. Carotid Dopplers revealed less than 39% internal carotid artery stenosis bilaterally. ECHO revealed ejection fraction 60-65% with grade 1 diastolic dysfunction. There were no valvular lesions found. Accelerated hypertension was addressed by resuming his anti-hypertensive medications. LDL was 33 and his A1c 6%, prediabetic. He is not on statin. Recurrent falls have been an issue. The patient does have spinal stenosis without neurogenic claudication and degenerative disease of the LS spine with chronic back pain. He had been admitted 6/21-6/25 having been found on floor with urinary incontinence. Citrobacter UTI was treated & he was discharged home prior to this most recent admission. SNF placement had been recommended in the past but declined by the patient. Psychiatric  evaluation has found him to have capacity ; but the patient has intermittent hallucinations for which he is on Seroquel. The patient has agreed to SNF placement for rehabilitation as he realizes cannot adequately care for himself at home at this time Labs prior to discharge 7/29 revealed a calcium of 8.5, ionized calcium was slightly low at 1.11. Albumin has been low normal.  Past medical and surgical history: Includes rhabdomyolysis in 2017, history of prostate cancer, peripheral neuropathy, essential hypertension, GERD, spinal stenosis of the lumbar region without associated neurogenic claudication, prediabetes and dementia. He's had a laminectomy, knee arthroscopy, and prostatectomy.  Social history: Nondrinker, former smoker.  Family history: Reviewed, cancer in a brother and maternal grandmother. He denies family history of stroke, diabetes, heart attack.  Review of systems: He describes swelling of his legs for > year. His back pain is chronic ,not associated with urinary or stool incontinence. He does have numbness of the left hand and forearm up to the elbow. He's had occasional nocturnal cough with thick sputum. He describes exertional dyspnea. He describes frequency and dysuria. He describes "air" in his stools. There is no diarrhea.  Constitutional: No fever,significant weight change, fatigue  Eyes: No redness, discharge, pain, vision change ENT/mouth: No nasal congestion,  purulent discharge, earache,change in hearing ,sore throat  Cardiovascular: No chest pain, palpitations,paroxysmal nocturnal dyspnea, claudication  Respiratory: No hemoptysis,significant snoring,apnea  Gastrointestinal: No heartburn,dysphagia,abdominal pain, nausea / vomiting,rectal bleeding, melena Genitourinary: No hematuria, pyuria,  incontinence, nocturia Musculoskeletal: No joint stiffness, joint swelling, weakness Dermatologic: No rash, pruritus, change in appearance of skin Neurologic: No  dizziness,headache,syncope, seizures Psychiatric: No significant anxiety , depression, insomnia, anorexia Endocrine: No change in hair/skin/ nails, excessive thirst, excessive hunger, excessive urination  Hematologic/lymphatic: No significant bruising, lymphadenopathy,abnormal bleeding Allergy/immunology: No itchy/ watery eyes,  significant sneezing, urticaria, angioedema  Physical exam:  Pertinent or positive findings: Pattern alopecia is present. He has a Engineering geologist. He has extremely poor dentition of the upper teeth. An S4 is noted. Heart sounds are somewhat distant. He has 1+ edema left leg and 3/4+ edema on the right. Pedal pulses are decreased. There is decreased grip of the left hand.  General appearance:Adequately nourished; no acute distress , increased work of breathing is present.   Lymphatic: No lymphadenopathy about the head, neck, axilla . Eyes: No conjunctival inflammation or lid edema is present. There is no scleral icterus. Ears:  External ear exam shows no significant lesions or deformities.   Nose:  External nasal examination shows no deformity or inflammation. Nasal mucosa are pink and moist without lesions ,exudates Oral exam: lips and gums are healthy appearing.There is no oropharyngeal erythema or exudate . Neck:  No thyromegaly, masses, tenderness noted.    Heart:  Normal rate and regular rhythm. S1 and S2 normal without gallop, murmur, click, rub .  Lungs:Chest clear to auscultation without wheezes, rhonchi,rales , rubs. Abdomen:Bowel sounds are normal. Abdomen is soft and nontender with no organomegaly, hernias,masses. GU: deferred  Extremities:  No cyanosis, clubbing  Neurologic exam : Balance,Rhomberg,finger to nose testing could not be completed due to clinical state Deep tendon reflexes are equal Skin: Warm & dry w/o tenting. No significant lesions or rash.  See clinical summary under each active problem in the Problem List with associated updated therapeutic  plan

## 2017-06-14 NOTE — Assessment & Plan Note (Signed)
PT/OT Nutrition assessment

## 2017-06-14 NOTE — Patient Instructions (Signed)
See Current Assessment & Plan in Problem List under specific Diagnosis 

## 2017-07-16 ENCOUNTER — Encounter: Payer: Self-pay | Admitting: Internal Medicine

## 2017-07-16 ENCOUNTER — Other Ambulatory Visit: Payer: Self-pay

## 2017-07-16 ENCOUNTER — Non-Acute Institutional Stay (SKILLED_NURSING_FACILITY): Payer: Medicare Other | Admitting: Internal Medicine

## 2017-07-16 DIAGNOSIS — I63331 Cerebral infarction due to thrombosis of right posterior cerebral artery: Secondary | ICD-10-CM

## 2017-07-16 DIAGNOSIS — I10 Essential (primary) hypertension: Secondary | ICD-10-CM | POA: Diagnosis not present

## 2017-07-16 DIAGNOSIS — R600 Localized edema: Secondary | ICD-10-CM | POA: Diagnosis not present

## 2017-07-16 MED ORDER — HYDROCHLOROTHIAZIDE 12.5 MG PO CAPS: 12.5000 mg | ORAL_CAPSULE | Freq: Every day | ORAL | 0 refills | Status: DC

## 2017-07-16 MED ORDER — DIVALPROEX SODIUM 250 MG PO DR TAB: 250.0000 mg | DELAYED_RELEASE_TABLET | Freq: Every day | ORAL | 0 refills | Status: AC

## 2017-07-16 MED ORDER — QUETIAPINE FUMARATE 50 MG PO TABS: 50.0000 mg | ORAL_TABLET | Freq: Every day | ORAL | 0 refills | Status: DC

## 2017-07-16 MED ORDER — METOPROLOL TARTRATE 25 MG PO TABS: 25.0000 mg | ORAL_TABLET | Freq: Two times a day (BID) | ORAL | 0 refills | Status: AC

## 2017-07-16 NOTE — Assessment & Plan Note (Signed)
06/13/17 amlodipine 5 mg was discontinued 07/16/17 edema persists despite discontinuing the vasodilating calcium channel blocker

## 2017-07-16 NOTE — Patient Instructions (Addendum)
See assessment and plan under each diagnosis in the problem list and acutely for this visit Patient was given a handwritten prescription for tramadol 50 mg #30, one every 8 hours as needed. Refills were to come from the [facility]. He was asked to make an appointment @ the [facility] as soon as possible. The other four active medications were faxed to the [facility] for refill.

## 2017-07-16 NOTE — Telephone Encounter (Signed)
Medications transmitted to [facility] on [address].

## 2017-07-16 NOTE — Addendum Note (Signed)
Addended byUnice Cobble F on: 07/16/2017 02:23 PM   Modules accepted: Level of Service

## 2017-07-16 NOTE — Assessment & Plan Note (Signed)
BP controlled; no change in antihypertensive medications  

## 2017-07-16 NOTE — Assessment & Plan Note (Addendum)
07/16/17 PT/OT feels that he is unsafe to return home due to balance issues and potential med noncompliance. They have recommended assisted living. At a minimum home PT/OT evaluation and treatment and home health social work consult recommended. Adult Protective Services also has been suggested. Psychiatry has stated that he is competent. Repeating this evaluation as an option.  I agree with discharge from the SNF by [facility] with initiation of recommendations above. 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

## 2017-07-16 NOTE — Progress Notes (Addendum)
NURSING HOME LOCATION:  [facility] ROOM NUMBER:  [id number]  CODE STATUS:  Full Code  PCP:  [facility]  [address] [place] [address]   This is a nursing facility follow up for specific acute issue of mental status evaluation as prelude to possible discharge.  Interim medical record and care since last [place] visit was updated with review of diagnostic studies and change in clinical status since last visit were documented.  [facility] has designated the patient no longer qualifies for SNF PT/OT. Discharge home was being discussed. The patient does want to return home, but PT/OT questions ability to care for himself. Their concern is home safety.SLUMS 8/18 revealed a value of 13/30 indicating significant dementia. They have recommended assisted living due to his poor balance issues and possible med noncompliance potential. At a minimum the recommended PT/OT home evaluation and treatment and home health social worker consultation  Also possible APS (adult protective services) referral was recommended. The patient was admitted to the [facility] 06/13/17 after being hospitalized 7/26-7/31/18 with stroke in the context adult failure to thrive. The patient did not present for several days after falling at home. MRI revealed punctate nonhemorrhagic white matter infarct in the posterior right temporal occipital lobe and diffuse atrophy/white matter disease. This was more extensive than would expected for age. Recurrent falls have been an issue by history. 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 Prior to the 7/26 admission he been admitted 6/21-6/25 with urinary incontinence. Citrobacter UTI was diagnosed and treated. Placement was recommended at that time but declined by the patient. Psychiatry evaluation found him to have capacity although the patient  has intermittent hallucinations for which he is on Seroquel. After the 7/26 admission he did agree to SNF placement for rehabilitation as he realized he was unable to adequately care for himself at home.  Review of systems:  Date given as 07/17/2017. He denies any active symptoms and his only concern is to return home. He does validate that balance is an issue. Edema is chronic and stable.  Constitutional: No fever,significant weight change, fatigue  Cardiovascular: No chest pain, palpitations,paroxysmal nocturnal dyspnea, claudication  Respiratory: No cough, sputum production,hemoptysis, DOE , significant snoring,apnea  Gastrointestinal: No heartburn,dysphagia,abdominal pain, nausea / vomiting,rectal bleeding, melena,change in bowels Genitourinary: No dysuria,hematuria, pyuria,  incontinence, nocturia Musculoskeletal: No joint stiffness, joint swelling Neurologic: No dizziness,headache,syncope, seizures Psychiatric: No significant anxiety , depression, insomnia, anorexia Endocrine: No change in hair/skin/ nails, excessive thirst, excessive hunger, excessive urination  Hematologic/lymphatic: No significant bruising, lymphadenopathy,abnormal bleeding Allergy/immunology: No itchy/ watery eyes, significant sneezing, urticaria, angioedema  Physical exam:  Pertinent or positive findings: Pattern alopecia is present. Dentition is fair with multiple missing teeth. Breath sounds are decreased. He has minor rales at the bases. There is decreased strength in the left lower extremity. One plus pedal edema. Pedal pulses are decreased. General appearance:Adequately nourished; no acute distress , increased work of breathing is present.   Lymphatic: No lymphadenopathy about the head, neck, axilla . Eyes: No conjunctival inflammation or lid edema is present. There is no scleral icterus. Ears:  External ear exam shows no significant lesions or deformities.   Nose:  External nasal examination shows no deformity  or inflammation. Nasal mucosa are pink and moist without lesions ,exudates Oral exam: lips and gums are healthy appearing.There is no oropharyngeal erythema or exudate . Neck:  No thyromegaly, masses,  tenderness noted.    Heart:  Normal rate and regular rhythm. S1 and S2 normal without gallop, murmur, click, rub .  Lungs:Chest clear to auscultation without wheezes, rhonchi,rales , rubs. Abdomen:Bowel sounds are normal. Abdomen is soft and nontender with no organomegaly, hernias,masses. GU: deferred  Extremities:  No cyanosis, clubbing Neurologic exam : Employing walker Balance,Rhomberg,finger to nose testing could not be completed due to clinical state Deep tendon reflexes are equal Skin: Warm & dry w/o tenting. No significant lesions or rash.  See summary under each active problem in the Problem List with associated updated therapeutic plan  07/16/17 2:18 pm patient is adamant about leaving the facility. As he has competence, he will be discharged with the services outlined above. He's been requested to make an appointment to see hs PCP @ [facility] as soon as possible. As he is unclear about his present supply of medications, all active medicines were faxed to the [facility] except for a handwritten prescription for tramadol 50 mg #30 one every 8 hours as needed. Refills to come from the [facility] [place].

## 2017-07-18 NOTE — Progress Notes (Signed)
This encounter was created in error - please disregard.

## 2017-07-30 NOTE — Progress Notes (Deleted)
[facility]  PATIENT: Evan Charles DOB: 11-23-1945   REASON FOR VISIT: *** HISTORY FROM:    HISTORY OF PRESENT ILLNESS:(per record) Evan Carriere Austinis an 71 y.o.malewith a history of chronic mental illness/dementia, frequent falls, seizures, spinal stenosis, and hypertension, who presented to [facility] 06/06/2017 with a 3 day history of left upper and lower extremity weakness. He has also had 2 falls over the past 3 days, without injuries noted by family or EMS. He stated thathe "can't walk right" and was dropping things when he triedto pick them up with his left hand. The subtype of the dementia has not been diagnosed. Of note, he has waxing and waning levels of cognition with "good days and bad days" per his relative. He also has formed visual hallucinations of people, which has become less responsive to antipsychotics over time. His relative is unsure if the patient thrashes around in his sleep or acts out his dreams. The patient has had shuffling gait and slow movements which have progressed gradually over the last 2 years - his relative had less contact with the patient before then and is unsure when the dementia or movement problems first started. BP 182/107.  Head CT obtained in the ED revealed no acute abnormality. Chronic atrophic and ischemic changes are noted.  Patient was not administered IV t-PA secondary to arriving outside of the treatment window. He was admitted to General Neurology for further evaluation and treatment.   SUBJECTIVE (INTERVAL HISTORY) He is alone in the room.  The patient is awake, orientated but forgetful, follow commands appropriately.  The patient's left hand and forearm are still weak.   REVIEW OF SYSTEMS: Full 14 system review of systems performed and notable only for those listed, all others are neg:  Constitutional: neg  Cardiovascular: neg Ear/Nose/Throat: neg  Skin: neg Eyes: neg Respiratory: neg Gastroitestinal: neg    Hematology/Lymphatic: neg  Endocrine: neg Musculoskeletal:neg Allergy/Immunology: neg Neurological: neg Psychiatric: neg Sleep : neg   ALLERGIES: No Known Allergies  HOME MEDICATIONS: Outpatient Medications Prior to Visit  Medication Sig Dispense Refill  . aspirin 325 MG tablet Take 1 tablet (325 mg total) by mouth daily.    . divalproex (DEPAKOTE) 250 MG DR tablet Take 1 tablet (250 mg total) by mouth at bedtime. 30 tablet 0  . docusate sodium (COLACE) 100 MG capsule Take 1 capsule (100 mg total) by mouth 2 (two) times daily as needed for moderate constipation.    . hydrochlorothiazide (MICROZIDE) 12.5 MG capsule Take 1 capsule (12.5 mg total) by mouth daily. 30 capsule 0  . metoprolol tartrate (LOPRESSOR) 25 MG tablet Take 1 tablet (25 mg total) by mouth 2 (two) times daily. 60 tablet 0  . polyethylene glycol (MIRALAX / GLYCOLAX) packet Take 17 g by mouth daily as needed for mild constipation. 14 each 0  . QUEtiapine (SEROQUEL) 50 MG tablet Take 1 tablet (50 mg total) by mouth at bedtime. 30 tablet 0  . senna (SENOKOT) 8.6 MG TABS tablet Take 1 tablet (8.6 mg total) by mouth at bedtime. 120 each 0  . traMADol (ULTRAM) 50 MG tablet Take 50 mg by mouth every 8 (eight) hours as needed (for pain).     No facility-administered medications prior to visit.     PAST MEDICAL HISTORY: Past Medical History:  Diagnosis Date  . Bilateral lower extremity edema 04/2017  . Chronic mental illness    Seroquel for intermittent hallucinations, psychiatry states he has capacity  . Colitis   .  Dementia   . GERD (gastroesophageal reflux disease)   . Hypertension   . Neuropathy    " MY HANDS "  . Prostate cancer ([place])   . Rhabdomyolysis 09/08/2016  . Spinal stenosis    DDD , no neurogenic claudication  . UTI (urinary tract infection) 04/2017    PAST SURGICAL HISTORY: Past Surgical History:  Procedure Laterality Date  . KNEE ARTHROSCOPY    . LAMINECTOMY    . PROSTATECTOMY       FAMILY HISTORY: Family History  Problem Relation Age of Onset  . Cancer Brother   . Cancer Maternal Grandmother     SOCIAL HISTORY: Social History   Social History  . Marital status: Single    Spouse name: N/A  . Number of children: N/A  . Years of education: N/A   Occupational History  . Retired    Social History Main Topics  . Smoking status: Former Smoker    Types: Cigarettes  . Smokeless tobacco: Never Used  . Alcohol use No  . Drug use: No  . Sexual activity: No   Other Topics Concern  . Not on file   Social History Narrative   Cares for self at home. Has friends to help and uses meals on wheels.      PHYSICAL EXAM  There were no vitals filed for this visit. There is no height or weight on file to calculate BMI.  Generalized: Well developed, in no acute distress  Head: normocephalic and atraumatic,. Oropharynx benign  Neck: Supple, no carotid bruits  Cardiac: Regular rate rhythm, no murmur  Musculoskeletal: No deformity   Neurological examination   Mentation: Alert oriented to time, place, history taking. Attention span and concentration appropriate. Recent and remote memory intact.  Follows all commands speech and language fluent.   Cranial nerve II-XII: Fundoscopic exam reveals sharp disc margins.Pupils were equal round reactive to light extraocular movements were full, visual field were full on confrontational test. Facial sensation and strength were normal. hearing was intact to finger rubbing bilaterally. Uvula tongue midline. head turning and shoulder shrug were normal and symmetric.Tongue protrusion into cheek strength was normal. Motor: normal bulk and tone, full strength in the BUE, BLE, fine finger movements normal, no pronator drift. No focal weakness Sensory: normal and symmetric to light touch, pinprick, and  Vibration, proprioception  Coordination: finger-nose-finger, heel-to-shin bilaterally, no dysmetria Reflexes: Brachioradialis 2/2,  biceps 2/2, triceps 2/2, patellar 2/2, Achilles 2/2, plantar responses were flexor bilaterally. Gait and Station: Rising up from seated position without assistance, normal stance,  moderate stride, good arm swing, smooth turning, able to perform tiptoe, and heel walking without difficulty. Tandem gait is steady  DIAGNOSTIC DATA (LABS, IMAGING, TESTING) - I reviewed patient records, labs, notes, testing and imaging myself where available.  Lab Results  Component Value Date   WBC 6.2 06/06/2017   HGB 15.3 06/06/2017   HCT 45.0 06/06/2017   MCV 84.6 06/06/2017   PLT 216 06/06/2017      Component Value Date/Time   NA 139 06/09/2017 0347   NA 144 03/20/2016   K 4.2 06/09/2017 0347   CL 106 06/09/2017 0347   CO2 27 06/09/2017 0347   GLUCOSE 106 (H) 06/09/2017 0347   BUN 11 06/09/2017 0347   BUN 21 03/20/2016   CREATININE 1.02 06/09/2017 0347   CALCIUM 8.5 (L) 06/09/2017 0347   PROT 6.7 06/06/2017 1358   ALBUMIN 3.6 06/06/2017 1358   AST 95 (H) 06/06/2017 1358   ALT 28 06/06/2017  1358   ALKPHOS 57 06/06/2017 1358   BILITOT 1.3 (H) 06/06/2017 1358   GFRNONAA >60 06/09/2017 0347   GFRAA >60 06/09/2017 0347   Lab Results  Component Value Date   CHOL 87 06/07/2017   HDL 45 06/07/2017   LDLCALC 33 06/07/2017   TRIG 44 06/07/2017   CHOLHDL 1.9 06/07/2017   Lab Results  Component Value Date   HGBA1C 6.0 (H) 06/07/2017   Lab Results  Component Value Date   VITAMINB12 274 09/08/2016   Lab Results  Component Value Date   TSH 3.056 09/08/2016    ***  ASSESSMENT AND PLAN  71 y.o. year old male  has a past medical history of Bilateral lower extremity edema (04/2017); Chronic mental illness; Colitis; Dementia; GERD (gastroesophageal reflux disease); Hypertension; Neuropathy; Prostate cancer ([place]); Rhabdomyolysis (09/08/2016); Spinal stenosis; and UTI (urinary tract infection) (04/2017). here with ***    Rayburn Ma, [facility], APRN  [facility] [address], [place], [place] [address] [phone]

## 2017-07-31 ENCOUNTER — Ambulatory Visit: Payer: Medicare Other | Admitting: Nurse Practitioner

## 2017-08-01 ENCOUNTER — Encounter: Payer: Self-pay | Admitting: Nurse Practitioner

## 2017-08-27 ENCOUNTER — Emergency Department (HOSPITAL_COMMUNITY): Payer: Medicare Other

## 2017-08-27 ENCOUNTER — Encounter (HOSPITAL_COMMUNITY): Payer: Self-pay | Admitting: *Deleted

## 2017-08-27 ENCOUNTER — Inpatient Hospital Stay (HOSPITAL_COMMUNITY)
Admission: EM | Admit: 2017-08-27 | Discharge: 2017-09-03 | DRG: 557 | Disposition: A | Discharge status: Home Health | Payer: Medicare Other | Attending: Nephrology | Admitting: Nephrology

## 2017-08-27 DIAGNOSIS — M6282 Rhabdomyolysis: Secondary | ICD-10-CM | POA: Diagnosis not present

## 2017-08-27 DIAGNOSIS — Z87891 Personal history of nicotine dependence: Secondary | ICD-10-CM | POA: Diagnosis not present

## 2017-08-27 DIAGNOSIS — R410 Disorientation, unspecified: Secondary | ICD-10-CM | POA: Diagnosis not present

## 2017-08-27 DIAGNOSIS — Z8673 Personal history of transient ischemic attack (TIA), and cerebral infarction without residual deficits: Secondary | ICD-10-CM

## 2017-08-27 DIAGNOSIS — E876 Hypokalemia: Secondary | ICD-10-CM | POA: Diagnosis present

## 2017-08-27 DIAGNOSIS — W19XXXD Unspecified fall, subsequent encounter: Secondary | ICD-10-CM

## 2017-08-27 DIAGNOSIS — F0391 Unspecified dementia with behavioral disturbance: Secondary | ICD-10-CM | POA: Diagnosis present

## 2017-08-27 DIAGNOSIS — R51 Headache: Secondary | ICD-10-CM | POA: Diagnosis not present

## 2017-08-27 DIAGNOSIS — M65222 Calcific tendinitis, left upper arm: Secondary | ICD-10-CM | POA: Diagnosis present

## 2017-08-27 DIAGNOSIS — K219 Gastro-esophageal reflux disease without esophagitis: Secondary | ICD-10-CM | POA: Diagnosis present

## 2017-08-27 DIAGNOSIS — Z7982 Long term (current) use of aspirin: Secondary | ICD-10-CM

## 2017-08-27 DIAGNOSIS — S4992XA Unspecified injury of left shoulder and upper arm, initial encounter: Secondary | ICD-10-CM | POA: Diagnosis not present

## 2017-08-27 DIAGNOSIS — G9341 Metabolic encephalopathy: Secondary | ICD-10-CM | POA: Diagnosis not present

## 2017-08-27 DIAGNOSIS — R404 Transient alteration of awareness: Secondary | ICD-10-CM | POA: Diagnosis not present

## 2017-08-27 DIAGNOSIS — G3183 Dementia with Lewy bodies: Secondary | ICD-10-CM | POA: Diagnosis present

## 2017-08-27 DIAGNOSIS — F329 Major depressive disorder, single episode, unspecified: Secondary | ICD-10-CM | POA: Diagnosis present

## 2017-08-27 DIAGNOSIS — F29 Unspecified psychosis not due to a substance or known physiological condition: Secondary | ICD-10-CM | POA: Diagnosis present

## 2017-08-27 DIAGNOSIS — Y92009 Unspecified place in unspecified non-institutional (private) residence as the place of occurrence of the external cause: Secondary | ICD-10-CM

## 2017-08-27 DIAGNOSIS — I639 Cerebral infarction, unspecified: Secondary | ICD-10-CM | POA: Diagnosis present

## 2017-08-27 DIAGNOSIS — R531 Weakness: Secondary | ICD-10-CM | POA: Diagnosis not present

## 2017-08-27 DIAGNOSIS — W19XXXA Unspecified fall, initial encounter: Secondary | ICD-10-CM | POA: Diagnosis present

## 2017-08-27 DIAGNOSIS — G629 Polyneuropathy, unspecified: Secondary | ICD-10-CM | POA: Diagnosis not present

## 2017-08-27 DIAGNOSIS — R748 Abnormal levels of other serum enzymes: Secondary | ICD-10-CM | POA: Diagnosis not present

## 2017-08-27 DIAGNOSIS — F0392 Unspecified dementia, unspecified severity, with psychotic disturbance: Secondary | ICD-10-CM | POA: Diagnosis present

## 2017-08-27 DIAGNOSIS — I10 Essential (primary) hypertension: Secondary | ICD-10-CM | POA: Diagnosis not present

## 2017-08-27 DIAGNOSIS — F0630 Mood disorder due to known physiological condition, unspecified: Secondary | ICD-10-CM | POA: Diagnosis present

## 2017-08-27 DIAGNOSIS — F0280 Dementia in other diseases classified elsewhere without behavioral disturbance: Secondary | ICD-10-CM | POA: Diagnosis present

## 2017-08-27 DIAGNOSIS — M25512 Pain in left shoulder: Secondary | ICD-10-CM | POA: Diagnosis not present

## 2017-08-27 DIAGNOSIS — Z79899 Other long term (current) drug therapy: Secondary | ICD-10-CM | POA: Diagnosis not present

## 2017-08-27 DIAGNOSIS — Z8546 Personal history of malignant neoplasm of prostate: Secondary | ICD-10-CM

## 2017-08-27 LAB — CBC WITH DIFFERENTIAL/PLATELET
Basophils Absolute: 0 10*3/uL (ref 0.0–0.1)
Basophils Relative: 0 %
Eosinophils Absolute: 0.1 10*3/uL (ref 0.0–0.7)
Eosinophils Relative: 1 %
HCT: 40.5 % (ref 39.0–52.0)
Hemoglobin: 13.3 g/dL (ref 13.0–17.0)
Lymphocytes Relative: 18 %
Lymphs Abs: 1.1 10*3/uL (ref 0.7–4.0)
MCH: 28.2 pg (ref 26.0–34.0)
MCHC: 32.8 g/dL (ref 30.0–36.0)
MCV: 86 fL (ref 78.0–100.0)
Monocytes Absolute: 0.6 10*3/uL (ref 0.1–1.0)
Monocytes Relative: 9 %
Neutro Abs: 4.3 10*3/uL (ref 1.7–7.7)
Neutrophils Relative %: 72 %
Platelets: 271 10*3/uL (ref 150–400)
RBC: 4.71 MIL/uL (ref 4.22–5.81)
RDW: 14.5 % (ref 11.5–15.5)
WBC: 6 10*3/uL (ref 4.0–10.5)

## 2017-08-27 LAB — CK: Total CK: 3599 U/L — ABNORMAL HIGH (ref 49–397)

## 2017-08-27 LAB — BASIC METABOLIC PANEL
Anion gap: 11 (ref 5–15)
BUN: 16 mg/dL (ref 6–20)
CO2: 26 mmol/L (ref 22–32)
Calcium: 9.2 mg/dL (ref 8.9–10.3)
Chloride: 104 mmol/L (ref 101–111)
Creatinine, Ser: 0.76 mg/dL (ref 0.61–1.24)
GFR calc Af Amer: >60 mL/min (ref >60)
GFR calc non Af Amer: >60 mL/min (ref >60)
Glucose, Bld: 90 mg/dL (ref 65–99)
Potassium: 3.1 mmol/L — ABNORMAL LOW (ref 3.5–5.1)
Sodium: 141 mmol/L (ref 135–145)

## 2017-08-27 LAB — CG4 I-STAT (LACTIC ACID): LACTIC ACID, VENOUS: 1.17 mmol/L (ref 0.5–1.9)

## 2017-08-27 MED ORDER — MORPHINE SULFATE (PF) 4 MG/ML IV SOLN: 4.0000 mg | Freq: Once | INTRAVENOUS | Status: DC

## 2017-08-27 NOTE — ED Notes (Addendum)
Pt refused the in-out and the nurse was informed

## 2017-08-27 NOTE — ED Triage Notes (Signed)
Pt bib EMS and coming from home.  Pt had a fall yesterday and was picked up off the ground today.  Pt has complaints.  No obvious deformity by EMS.  Pt denies hitting his head.  Pt lives at home by himself.   Pt reports chronic pain.   EMS: VS: BP: 158/86 HR: 70, CBG: 87

## 2017-08-27 NOTE — ED Notes (Signed)
Tried to get Pt's blood work and Pt drew back arm, failed attempt. Tried to get Pt's EKG but Pt is constantly shaking throughout body and arms, could not get clear image. Pt transported to xray. Will re attempt upon return.

## 2017-08-27 NOTE — ED Notes (Signed)
Gave Pt Sandwich and drink after okay from PA.

## 2017-08-27 NOTE — ED Notes (Signed)
Offered Pt a sandwich and drink. Asked Pt if he could eat it himself, Pt replied yes. Pt then proceeded to bring his hands up to his face as it he was pretend eating, then he took a bite of his blanket. Pt proceeded to do this several times. Told Pt that was not his sandwich he then stated that he might be confused. Pt has then done this several times after.

## 2017-08-28 ENCOUNTER — Other Ambulatory Visit: Payer: Self-pay

## 2017-08-28 ENCOUNTER — Encounter (HOSPITAL_COMMUNITY): Payer: Self-pay | Admitting: Internal Medicine

## 2017-08-28 DIAGNOSIS — F0391 Unspecified dementia with behavioral disturbance: Secondary | ICD-10-CM

## 2017-08-28 DIAGNOSIS — M6282 Rhabdomyolysis: Principal | ICD-10-CM

## 2017-08-28 DIAGNOSIS — F0280 Dementia in other diseases classified elsewhere without behavioral disturbance: Secondary | ICD-10-CM | POA: Diagnosis present

## 2017-08-28 DIAGNOSIS — Z8673 Personal history of transient ischemic attack (TIA), and cerebral infarction without residual deficits: Secondary | ICD-10-CM | POA: Diagnosis not present

## 2017-08-28 DIAGNOSIS — Y92009 Unspecified place in unspecified non-institutional (private) residence as the place of occurrence of the external cause: Secondary | ICD-10-CM | POA: Diagnosis not present

## 2017-08-28 DIAGNOSIS — G9341 Metabolic encephalopathy: Secondary | ICD-10-CM | POA: Diagnosis not present

## 2017-08-28 DIAGNOSIS — E876 Hypokalemia: Secondary | ICD-10-CM | POA: Diagnosis not present

## 2017-08-28 DIAGNOSIS — I10 Essential (primary) hypertension: Secondary | ICD-10-CM | POA: Diagnosis present

## 2017-08-28 DIAGNOSIS — G629 Polyneuropathy, unspecified: Secondary | ICD-10-CM | POA: Diagnosis present

## 2017-08-28 DIAGNOSIS — K219 Gastro-esophageal reflux disease without esophagitis: Secondary | ICD-10-CM | POA: Diagnosis present

## 2017-08-28 DIAGNOSIS — R410 Disorientation, unspecified: Secondary | ICD-10-CM | POA: Diagnosis present

## 2017-08-28 DIAGNOSIS — G3183 Dementia with Lewy bodies: Secondary | ICD-10-CM | POA: Diagnosis present

## 2017-08-28 DIAGNOSIS — W19XXXA Unspecified fall, initial encounter: Secondary | ICD-10-CM

## 2017-08-28 DIAGNOSIS — M65222 Calcific tendinitis, left upper arm: Secondary | ICD-10-CM | POA: Diagnosis present

## 2017-08-28 DIAGNOSIS — F0630 Mood disorder due to known physiological condition, unspecified: Secondary | ICD-10-CM | POA: Diagnosis present

## 2017-08-28 DIAGNOSIS — Z8546 Personal history of malignant neoplasm of prostate: Secondary | ICD-10-CM | POA: Diagnosis not present

## 2017-08-28 DIAGNOSIS — Z79899 Other long term (current) drug therapy: Secondary | ICD-10-CM | POA: Diagnosis not present

## 2017-08-28 DIAGNOSIS — F329 Major depressive disorder, single episode, unspecified: Secondary | ICD-10-CM | POA: Diagnosis present

## 2017-08-28 DIAGNOSIS — F29 Unspecified psychosis not due to a substance or known physiological condition: Secondary | ICD-10-CM | POA: Diagnosis present

## 2017-08-28 DIAGNOSIS — Z7982 Long term (current) use of aspirin: Secondary | ICD-10-CM | POA: Diagnosis not present

## 2017-08-28 DIAGNOSIS — G8190 Hemiplegia, unspecified affecting unspecified side: Secondary | ICD-10-CM | POA: Diagnosis not present

## 2017-08-28 DIAGNOSIS — Z87891 Personal history of nicotine dependence: Secondary | ICD-10-CM | POA: Diagnosis not present

## 2017-08-28 LAB — BASIC METABOLIC PANEL
Anion gap: 12 (ref 5–15)
BUN: 15 mg/dL (ref 6–20)
CO2: 26 mmol/L (ref 22–32)
CREATININE: 0.76 mg/dL (ref 0.61–1.24)
Calcium: 8.3 mg/dL — ABNORMAL LOW (ref 8.9–10.3)
Chloride: 103 mmol/L (ref 101–111)
GFR calc Af Amer: >60 mL/min (ref >60)
Glucose, Bld: 86 mg/dL (ref 65–99)
Potassium: 3 mmol/L — ABNORMAL LOW (ref 3.5–5.1)
SODIUM: 141 mmol/L (ref 135–145)

## 2017-08-28 LAB — URINALYSIS, ROUTINE W REFLEX MICROSCOPIC
Bacteria, UA: NONE SEEN
Bilirubin Urine: NEGATIVE
GLUCOSE, UA: NEGATIVE mg/dL
Hgb urine dipstick: NEGATIVE
Ketones, ur: 80 mg/dL — AB
Leukocytes, UA: NEGATIVE
Nitrite: NEGATIVE
PH: 5 (ref 5.0–8.0)
Protein, ur: 30 mg/dL — AB
SPECIFIC GRAVITY, URINE: 1.029 (ref 1.005–1.030)

## 2017-08-28 LAB — CBC
HCT: 34.3 % — ABNORMAL LOW (ref 39.0–52.0)
Hemoglobin: 11.4 g/dL — ABNORMAL LOW (ref 13.0–17.0)
MCH: 28.6 pg (ref 26.0–34.0)
MCHC: 33.2 g/dL (ref 30.0–36.0)
MCV: 86.2 fL (ref 78.0–100.0)
PLATELETS: 254 10*3/uL (ref 150–400)
RBC: 3.98 MIL/uL — ABNORMAL LOW (ref 4.22–5.81)
RDW: 15 % (ref 11.5–15.5)
WBC: 4.9 10*3/uL (ref 4.0–10.5)

## 2017-08-28 LAB — MAGNESIUM: MAGNESIUM: 1.8 mg/dL (ref 1.7–2.4)

## 2017-08-28 LAB — CK
CK TOTAL: 3062 U/L — AB (ref 49–397)
Total CK: 2907 U/L — ABNORMAL HIGH (ref 49–397)

## 2017-08-28 LAB — CBG MONITORING, ED: GLUCOSE-CAPILLARY: 90 mg/dL (ref 65–99)

## 2017-08-28 LAB — AMMONIA: Ammonia: 29 umol/L (ref 9–35)

## 2017-08-28 MED ORDER — HYDRALAZINE HCL 20 MG/ML IJ SOLN: 10.0000 mg | INTRAMUSCULAR | Status: DC | PRN

## 2017-08-28 MED ORDER — ONDANSETRON HCL 4 MG PO TABS: 4.0000 mg | ORAL_TABLET | Freq: Four times a day (QID) | ORAL | Status: DC | PRN

## 2017-08-28 MED ORDER — SENNA 8.6 MG PO TABS
1.0000 | ORAL_TABLET | Freq: Every day | ORAL | Status: DC
  Administered 2017-08-28 – 2017-09-02 (×6): 8.6 mg via ORAL
  Filled 2017-08-28 (×6): qty 1

## 2017-08-28 MED ORDER — TRAMADOL HCL 50 MG PO TABS
50.0000 mg | ORAL_TABLET | Freq: Three times a day (TID) | ORAL | Status: DC | PRN
  Administered 2017-08-28 – 2017-09-02 (×6): 50 mg via ORAL
  Filled 2017-08-28 (×7): qty 1

## 2017-08-28 MED ORDER — ACETAMINOPHEN 650 MG RE SUPP: 650.0000 mg | Freq: Four times a day (QID) | RECTAL | Status: DC | PRN

## 2017-08-28 MED ORDER — POLYETHYLENE GLYCOL 3350 17 G PO PACK: 17.0000 g | PACK | Freq: Every day | ORAL | Status: DC | PRN

## 2017-08-28 MED ORDER — HYDROCHLOROTHIAZIDE 12.5 MG PO CAPS
12.5000 mg | ORAL_CAPSULE | Freq: Every day | ORAL | Status: DC
  Administered 2017-08-28 – 2017-09-02 (×5): 12.5 mg via ORAL
  Filled 2017-08-28 (×6): qty 1

## 2017-08-28 MED ORDER — ASPIRIN 325 MG PO TABS
325.0000 mg | ORAL_TABLET | Freq: Every day | ORAL | Status: DC
  Administered 2017-08-28 – 2017-09-03 (×6): 325 mg via ORAL
  Filled 2017-08-28 (×7): qty 1

## 2017-08-28 MED ORDER — DIVALPROEX SODIUM 250 MG PO DR TAB
250.0000 mg | DELAYED_RELEASE_TABLET | Freq: Every day | ORAL | Status: DC
  Administered 2017-08-28 – 2017-09-02 (×6): 250 mg via ORAL
  Filled 2017-08-28 (×6): qty 1

## 2017-08-28 MED ORDER — METOPROLOL TARTRATE 25 MG PO TABS
25.0000 mg | ORAL_TABLET | Freq: Two times a day (BID) | ORAL | Status: DC
  Administered 2017-08-28 – 2017-09-03 (×12): 25 mg via ORAL
  Filled 2017-08-28 (×13): qty 1

## 2017-08-28 MED ORDER — POTASSIUM CHLORIDE 20 MEQ/15ML (10%) PO SOLN
40.0000 meq | Freq: Once | ORAL | Status: DC
  Filled 2017-08-28: qty 30

## 2017-08-28 MED ORDER — ONDANSETRON HCL 4 MG/2ML IJ SOLN
4.0000 mg | Freq: Four times a day (QID) | INTRAMUSCULAR | Status: DC | PRN
Start: 2017-08-28 — End: 2017-09-03

## 2017-08-28 MED ORDER — DOCUSATE SODIUM 100 MG PO CAPS: 100.0000 mg | ORAL_CAPSULE | Freq: Two times a day (BID) | ORAL | Status: DC | PRN

## 2017-08-28 MED ORDER — HYDRALAZINE HCL 20 MG/ML IJ SOLN: 5.0000 mg | INTRAMUSCULAR | Status: DC | PRN

## 2017-08-28 MED ORDER — ACETAMINOPHEN 325 MG PO TABS: 650.0000 mg | ORAL_TABLET | Freq: Four times a day (QID) | ORAL | Status: DC | PRN

## 2017-08-28 MED ORDER — ENOXAPARIN SODIUM 40 MG/0.4ML ~~LOC~~ SOLN
40.0000 mg | SUBCUTANEOUS | Status: DC
  Administered 2017-08-28 – 2017-09-02 (×6): 40 mg via SUBCUTANEOUS
  Filled 2017-08-28 (×6): qty 0.4

## 2017-08-28 MED ORDER — SODIUM CHLORIDE 0.9 % IV SOLN
INTRAVENOUS | Status: DC
  Administered 2017-08-28 (×3): via INTRAVENOUS

## 2017-08-28 MED ORDER — QUETIAPINE FUMARATE 50 MG PO TABS
50.0000 mg | ORAL_TABLET | Freq: Every day | ORAL | Status: DC
  Administered 2017-08-28 – 2017-08-29 (×2): 50 mg via ORAL
  Filled 2017-08-28 (×2): qty 1

## 2017-08-28 MED ORDER — ZOLPIDEM TARTRATE 5 MG PO TABS
5.0000 mg | ORAL_TABLET | Freq: Every evening | ORAL | Status: DC | PRN
  Administered 2017-09-01 – 2017-09-02 (×2): 5 mg via ORAL
  Filled 2017-08-28 (×2): qty 1

## 2017-08-28 NOTE — ED Notes (Signed)
Pt resting comfortably.  Vitals WDL.  No urine noted in condom cath drainage bag.

## 2017-08-28 NOTE — ED Notes (Signed)
Call report to Caryl Pina [phone] @ 1245.Stacey Drain

## 2017-08-28 NOTE — Progress Notes (Signed)
Patient seen and evaluated today. He denies any acute complaints or concerns. He has been admitted with rhabdomyolysis secondary to a fall and did not have significantly diminishing CK levels noted initially on IV normal saline 125 mL per hour, but this was causing the levels to fall to slowly and therefore I have increased the rate to 200 mL per hour at this time which the patient appears to be tolerating well. He does have some blood pressure elevations for which we have IV hydralazine increased to 10 mg IV every 2 hours as needed. PT has evaluated the patient today and I anticipate DC to SNF in AM if improved. Repeat labs in AM.

## 2017-08-28 NOTE — ED Notes (Signed)
Pt alert to verbal stimuli, but only oriented to name.  Pt sts "it was a hell of a night."  When asked for further details, the Pt mumbled nonsensically.  Pt unable to follow commands for neuro check.

## 2017-08-28 NOTE — Evaluation (Signed)
Physical Therapy Evaluation Patient Details Name: Evan Charles MRN: [id number] DOB: 07/02/1946 Today's Date: 08/28/2017   History of Present Illness  Pt is a 71 y.o. male with a history of spinal stenosis, seizures, visual hallucinations, and dementia.  Pt admitted from home s/p fall with shoulder pain and UTI   Clinical Impression  Pt admitted as above and presenting with functional mobility limitations 2* generalized weakness, dementia related cognitive deficits and significant ambulatory balance deficits.  This date, pt requiring assist of 2 for safe performance of all mobility tasks.  Pt inconsistently following cues and only intermittently answering questions appropriately - for example described his home situation exactly as on previous admit.  Unless significant progress made during acute stay, pt would benefit from follow up rehab at SNF level to maximize IND and safety.    Follow Up Recommendations SNF    Equipment Recommendations  None recommended by PT    Recommendations for Other Services OT consult     Precautions / Restrictions Precautions Precautions: Fall Restrictions Weight Bearing Restrictions: No Other Position/Activity Restrictions: WBAT      Mobility  Bed Mobility Overal bed mobility: Needs Assistance Bed Mobility: Supine to Sit;Sit to Supine     Supine to sit: Min assist;+2 for physical assistance Sit to supine: Min assist;+2 for physical assistance   General bed mobility comments: Increased time with cues for sequence with assist to manage LEs and bring trunk to upright  Transfers Overall transfer level: Needs assistance Equipment used: 2 person hand held assist Transfers: Sit to/from Stand Sit to Stand: Min assist;Mod assist;+2 physical assistance;+2 safety/equipment;From elevated surface         General transfer comment: cues for use of UEs and assist to bring weight up and fwd and to correct for posterior drift in  standing  Ambulation/Gait Ambulation/Gait assistance: Min assist;+2 physical assistance;+2 safety/equipment Ambulation Distance (Feet): 2 Feet Assistive device: 2 person hand held assist Gait Pattern/deviations: Step-to pattern;Decreased step length - right;Decreased step length - left;Shuffle;Leaning posteriorly Gait velocity: decr Gait velocity interpretation: Below normal speed for age/gender General Gait Details: Pt able to take short shuffling steps bk to bedside and side step up side of bed with HHA of 2 for balance/support.  Stairs            Wheelchair Mobility    Modified Rankin (Stroke Patients Only)       Balance Overall balance assessment: Needs assistance Sitting-balance support: Feet supported;No upper extremity supported Sitting balance-Leahy Scale: Fair     Standing balance support: Bilateral upper extremity supported Standing balance-Leahy Scale: Poor                               Pertinent Vitals/Pain Pain Assessment: No/denies pain    Home Living Family/patient expects to be discharged to:: Private residence Living Arrangements: Alone Available Help at Discharge: Family;Friend(s);Available PRN/intermittently Type of Home: House Home Access: Stairs to enter Entrance Stairs-Rails: Psychiatric nurse of Steps: 6 Home Layout: One level Home Equipment: Walker - 2 wheels;Cane - single point;Bedside commode;Shower seat;Wheelchair - manual Additional Comments: information from previous admission and verified by pt    Prior Function Level of Independence: Needs assistance   Gait / Transfers Assistance Needed: RW for gait at times  ADL's / Homemaking Assistance Needed: nephew helps with errands and appts, food from Meals on Wheels  Comments: pt states he uses RW, assist from nephew for bathing, meals on wheels  Hand Dominance   Dominant Hand: Right    Extremity/Trunk Assessment   Upper Extremity Assessment Upper  Extremity Assessment: Generalized weakness    Lower Extremity Assessment Lower Extremity Assessment: Generalized weakness    Cervical / Trunk Assessment Cervical / Trunk Assessment: Normal  Communication   Communication: No difficulties  Cognition Arousal/Alertness: Awake/alert Behavior During Therapy: Flat affect Overall Cognitive Status: History of cognitive impairments - at baseline                                        General Comments      Exercises     Assessment/Plan    PT Assessment Patient needs continued PT services  PT Problem List Decreased strength;Decreased activity tolerance;Decreased balance;Decreased mobility;Decreased cognition;Decreased knowledge of use of DME;Decreased safety awareness       PT Treatment Interventions DME instruction;Gait training;Functional mobility training;Therapeutic activities;Therapeutic exercise;Patient/family education;Balance training    PT Goals (Current goals can be found in the Care Plan section)  Acute Rehab PT Goals Patient Stated Goal: No goals stated PT Goal Formulation: Patient unable to participate in goal setting Time For Goal Achievement: 09/11/17 Potential to Achieve Goals: Fair    Frequency Min 3X/week   Barriers to discharge        Co-evaluation               AM-PAC PT "6 Clicks" Daily Activity  Outcome Measure Difficulty turning over in bed (including adjusting bedclothes, sheets and blankets)?: A Lot Difficulty moving from lying on back to sitting on the side of the bed? : Unable Difficulty sitting down on and standing up from a chair with arms (e.g., wheelchair, bedside commode, etc,.)?: Unable Help needed moving to and from a bed to chair (including a wheelchair)?: A Lot Help needed walking in hospital room?: A Lot Help needed climbing 3-5 steps with a railing? : Total 6 Click Score: 9    End of Session Equipment Utilized During Treatment: Gait belt Activity Tolerance:  Patient limited by fatigue Patient left: in bed;with call bell/phone within reach Nurse Communication: Mobility status PT Visit Diagnosis: Unsteadiness on feet (R26.81);History of falling (Z91.81);Difficulty in walking, not elsewhere classified (R26.2)    Time: 6283-1517 PT Time Calculation (min) (ACUTE ONLY): 24 min   Charges:   PT Evaluation $PT Eval Moderate Complexity: 1 Mod PT Treatments $Therapeutic Activity: 8-22 mins   PT G Codes:        Pg [phone]   Mayerli Kirst 08/28/2017, 12:44 PM

## 2017-08-28 NOTE — ED Provider Notes (Signed)
[place] DEPT Provider Note   CSN: [id number] Arrival date & time: 08/27/17  1421     History   Chief Complaint Chief Complaint  Patient presents with  . Fall  . Shoulder Pain    HPI Evan Charles is a 71 y.o. male.  HPI   Patient presents to the emergency department following a fall where the patient states he was on the floor for about 15 hours.  The patient is somewhat confused but can answer some questions appropriately but then others seems to not be able to answer correctly.  He can tell me the year but not the day of the week or month. Patient is a very disjointed story about what happened.  He states that he is not sure why he fell.  He states she has had multiple falls in the past.  Past Medical History:  Diagnosis Date  . Bilateral lower extremity edema 04/2017  . Chronic mental illness    Seroquel for intermittent hallucinations, psychiatry states he has capacity  . Colitis   . Dementia   . GERD (gastroesophageal reflux disease)   . Hypertension   . Neuropathy    " MY HANDS "  . Prostate cancer ([place])   . Rhabdomyolysis 09/08/2016  . Spinal stenosis    DDD , no neurogenic claudication  . UTI (urinary tract infection) 04/2017    Patient Active Problem List   Diagnosis Date Noted  . Prediabetes 06/08/2017  . Stroke (cerebrum) ([place])   . 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 Dystonia 06/07/2017  . Weakness 06/06/2017  . Accelerated hypertension 06/06/2017  . Leg weakness 04/28/2017  . 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 Impaired mobility and ADLs   . Hypokalemia 11/05/2016  . Dementia with psychosis 09/12/2016  . Acute delirium 09/08/2016  . Normocytic anemia 09/08/2016  . Altered mental status 09/08/2016  . Fall at home   . Mild cognitive impairment 07/25/2016  . Immobility 03/20/2016  . Generalized weakness 03/20/2016  . Failure to thrive in adult  03/20/2016  . Neuropathy   . Essential hypertension   . Physical deconditioning   . 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 GERD without esophagitis 12/19/2015  . Bilateral lower extremity edema 12/19/2015  . Intractable low back pain 02/14/2015  . Difficulty walking 02/14/2015  . H/O: upper GI bleed   . Visual hallucinations   . Psychoses [facility])     Past Surgical History:  Procedure Laterality Date  . KNEE ARTHROSCOPY    . LAMINECTOMY    . PROSTATECTOMY         Home Medications    Prior to Admission medications   Medication Sig Start Date End Date Taking? Authorizing Provider  aspirin 325 MG tablet Take 1 tablet (325 mg total) by mouth daily. 06/12/17  Yes Rama, Venetia Maxon, MD  divalproex (DEPAKOTE) 250 MG DR tablet Take 1 tablet (250 mg total) by mouth at bedtime. 07/16/17  Yes Hendricks Limes, MD  docusate sodium (COLACE) 100 MG capsule Take 1 capsule (100 mg total) by mouth 2 (two) times daily as needed for moderate constipation. 04/30/17  Yes Zada Finders, MD  hydrochlorothiazide (MICROZIDE) 12.5 MG capsule Take 1 capsule (12.5 mg total) by mouth daily. 07/16/17  Yes Hendricks Limes, MD  metoprolol tartrate (LOPRESSOR) 25 MG tablet Take 1 tablet (25 mg total) by mouth 2 (two) times daily. 07/16/17  Yes Hendricks Limes, MD  polyethylene glycol (MIRALAX / GLYCOLAX) packet Take 17 g by mouth daily as needed for mild constipation. 06/11/17  Yes Rama, Venetia Maxon, MD  QUEtiapine (SEROQUEL) 50 MG tablet Take 1 tablet (50 mg total) by mouth at bedtime. 07/16/17  Yes Hendricks Limes, MD  senna (SENOKOT) 8.6 MG TABS tablet Take 1 tablet (8.6 mg total) by mouth at bedtime. 06/11/17  Yes Rama, Venetia Maxon, MD  traMADol (ULTRAM) 50 MG tablet Take 50 mg by mouth every 8 (eight) hours as needed (for pain).   Yes [provider]    Family History Family History  Problem Relation Age of Onset  . Cancer Brother   . Cancer Maternal Grandmother     Social  History Social History  Substance Use Topics  . Smoking status: Former Smoker    Types: Cigarettes  . Smokeless tobacco: Never Used  . Alcohol use No     Allergies   Patient has no known allergies.   Review of Systems Review of Systems Level V caveat due to altered mental status  Physical Exam Updated Vital Signs BP 108/77 (BP Location: Left Arm)   Pulse 99   Temp 97.8 F (36.6 C) (Rectal)   Resp 18   SpO2 97%   Physical Exam  Constitutional: He appears well-developed and well-nourished. No distress.  HENT:  Head: Normocephalic and atraumatic.  Mouth/Throat: Oropharynx is clear and moist.  Eyes: Pupils are equal, round, and reactive to light.  Neck: Normal range of motion. Neck supple.  Cardiovascular: Normal rate, regular rhythm and normal heart sounds.  Exam reveals no gallop and no friction rub.   No murmur heard. Pulmonary/Chest: Effort normal and breath sounds normal. No respiratory distress. He has no wheezes.  Abdominal: Soft. Bowel sounds are normal. He exhibits no distension. There is no tenderness.  Neurological: He is alert. He exhibits normal muscle tone. Coordination normal.  Skin: Skin is warm and dry. Capillary refill takes less than 2 seconds. No rash noted. No erythema.  Psychiatric: He has a normal mood and affect. His behavior is normal.  Nursing note and vitals reviewed.    ED Treatments / Results  Labs (all labs ordered are listed, but only abnormal results are displayed) Labs Reviewed  BASIC METABOLIC PANEL - Abnormal; Notable for the following:       Result Value   Potassium 3.1 (*)    All other components within normal limits  CK - Abnormal; Notable for the following:    Total CK 3,599 (*)    All other components within normal limits  CBC WITH DIFFERENTIAL/PLATELET  URINALYSIS, ROUTINE W REFLEX MICROSCOPIC  AMMONIA  I-STAT CG4 LACTIC ACID, ED  CG4 I-STAT (LACTIC ACID)    EKG  EKG Interpretation None       Radiology Ct Head  Wo Contrast  Result Date: 08/27/2017 CLINICAL DATA:  Headache after fall yesterday.  Initial encounter. EXAM: CT HEAD WITHOUT CONTRAST TECHNIQUE: Contiguous axial images were obtained from the base of the skull through the vertex without intravenous contrast. COMPARISON:  06/07/2017 MRI FINDINGS: Brain: Mild involutional changes of brain, consistent with patient age. No acute intracranial hemorrhage, midline shift or edema. Mild small vessel ischemic disease of periventricular and subcortical white matter. No intra-axial mass nor extra-axial collections. Vascular: No hyperdense vessels. No mild atherosclerosis of carotid siphons bilaterally. Skull: No skull fracture. Sinuses/Orbits: No acute sinus disease. Intact orbits and globes. Clear mastoids. Other: None IMPRESSION: Chronic appearing small vessel ischemic disease. No acute intracranial abnormality. Electronically  Signed   By: Ashley Royalty M.D.   On: 08/27/2017 22:44   Dg Shoulder Left  Result Date: 08/27/2017 CLINICAL DATA:  Fall with diffuse shoulder pain EXAM: LEFT SHOULDER - 2+ VIEW COMPARISON:  05/02/2017 FINDINGS: No fracture or dislocation. [facility] joint appears intact. Punctate calcification at the left humeral head. Probable elevated left diaphragm. IMPRESSION: 1. No acute osseous abnormality 2. Small amount of calcific tendinitis Electronically Signed   By: Donavan Foil M.D.   On: 08/27/2017 15:35    Procedures Procedures (including critical care time)  Medications Ordered in ED Medications - No data to display   Initial Impression / Assessment and Plan / ED Course  I have reviewed the triage vital signs and the nursing notes.  Pertinent labs & imaging results that were available during my care of the patient were reviewed by me and considered in my medical decision making (see chart for details).     Patient will need admission to the hospital for further evaluation of his altered mental status and he most likely has rhabdo.    Final Clinical Impressions(s) / ED Diagnoses   Final diagnoses:  None    New Prescriptions New Prescriptions   No medications on file     Dalia Heading, Hershal Coria 08/28/17 0026    Isla Pence, MD 08/30/17 [phone]

## 2017-08-28 NOTE — H&P (Addendum)
History and Physical    Evan Charles KGU:[id number] DOB: November 30, 1945 DOA: 08/27/2017  Referring MD/NP/PA:   PCP: [facility]   Patient coming from:  The patient is coming from home.  At baseline, pt is partially dependent for most of ADL.  Chief Complaint: fall, AMS  HPI: STPEHEN Charles is a 71 y.o. male with medical history significant of hypertension, stroke, GERD, depression, spinal stenosis, prostate cancer, dementia with psychosis, colitis, rhabdomyolysis, [name], multiple fall, who presents with fall and altered mental status.  Patient has dementia and AMS,  and is unable to provide accurate medical history, therefore, most of the history is obtained by discussing the case with ED physician, per EMS report, and with the nursing staff.   Per report, pt lives alone at home. He had a fall yesterday, and he was on the floor for about 15 hours. He is confused, and can only answer a few questions appropriately. Not sure about his baseline mental status. Pt denies hitting his head or neck. He has pain in left shoulder, but no swelling or deformity. No active cough, shortness of breath, nausea, vomiting or diarrhea noted. Patient denies abdominal pain, chest pain or symptoms of UTI. He moves his right arm freely, little bit of right leg, but not moving left arm or left leg. Chart review revealed that pt had left sided weakness which was documented by Dr. Quincy Sheehan HPI on 06/06/17.   ED Course: pt was found to have WBC 6.0, lactic acid 1.17, CK 3599, ammonia level 29, potassium 3.1, creatinine normal, heart rate in 90s, no tachypnea, temperature normal, oxygen saturation 97% on room air, CT head is negative for acute intracranial abnormalities. X-ray of her left shoulder is negative for acute bony fracture. Patient is admitted to telemetry bed as inpatient.  Review of Systems: could not be reviewed accurately due to dementia and altered mental status.  Allergy: No Known  Allergies  Past Medical History:  Diagnosis Date  . Bilateral lower extremity edema 04/2017  . Chronic mental illness    Seroquel for intermittent hallucinations, psychiatry states he has capacity  . Colitis   . Dementia   . GERD (gastroesophageal reflux disease)   . Hypertension   . Neuropathy    " MY HANDS "  . Prostate cancer ([place])   . Rhabdomyolysis 09/08/2016  . Spinal stenosis    DDD , no neurogenic claudication  . UTI (urinary tract infection) 04/2017    Past Surgical History:  Procedure Laterality Date  . KNEE ARTHROSCOPY    . LAMINECTOMY    . PROSTATECTOMY      Social History:  reports that he has quit smoking. His smoking use included Cigarettes. He has never used smokeless tobacco. He reports that he does not drink alcohol or use drugs.  Family History:  Family History  Problem Relation Age of Onset  . Cancer Brother   . Cancer Maternal Grandmother      Prior to Admission medications   Medication Sig Start Date End Date Taking? Authorizing Provider  aspirin 325 MG tablet Take 1 tablet (325 mg total) by mouth daily. 06/12/17  Yes Rama, Venetia Maxon, MD  divalproex (DEPAKOTE) 250 MG DR tablet Take 1 tablet (250 mg total) by mouth at bedtime. 07/16/17  Yes Hendricks Limes, MD  docusate sodium (COLACE) 100 MG capsule Take 1 capsule (100 mg total) by mouth 2 (two) times daily as needed for moderate constipation. 04/30/17  Yes Zada Finders, MD  hydrochlorothiazide (  MICROZIDE) 12.5 MG capsule Take 1 capsule (12.5 mg total) by mouth daily. 07/16/17  Yes Hendricks Limes, MD  metoprolol tartrate (LOPRESSOR) 25 MG tablet Take 1 tablet (25 mg total) by mouth 2 (two) times daily. 07/16/17  Yes Hendricks Limes, MD  polyethylene glycol [facility]) packet Take 17 g by mouth daily as needed for mild constipation. 06/11/17  Yes Rama, Venetia Maxon, MD  QUEtiapine (SEROQUEL) 50 MG tablet Take 1 tablet (50 mg total) by mouth at bedtime. 07/16/17  Yes Hendricks Limes, MD  senna  (SENOKOT) 8.6 MG TABS tablet Take 1 tablet (8.6 mg total) by mouth at bedtime. 06/11/17  Yes Rama, Venetia Maxon, MD  traMADol (ULTRAM) 50 MG tablet Take 50 mg by mouth every 8 (eight) hours as needed (for pain).   Yes [provider]    Physical Exam: Vitals:   08/27/17 2201 08/27/17 2252 08/28/17 0121 08/28/17 0209  BP: 108/77   (!) 151/75  Pulse: 99   83  Resp: 18   17  Temp: 97.8 F (36.6 C) 97.8 F (36.6 C) 98.7 F (37.1 C)   TempSrc: Oral Rectal    SpO2: 97%   96%   General: Not in acute distress HEENT:       Eyes: PERRL, EOMI, no scleral icterus.       ENT: No discharge from the ears and nose, no pharynx injection, no tonsillar enlargement.        Neck: No JVD, no bruit, no mass felt. Heme: No neck lymph node enlargement. Cardiac: S1/S2, RRR, No murmurs, No gallops or rubs. Respiratory:  No rales, wheezing, rhonchi or rubs. GI: Soft, nondistended, nontender, no rebound pain, no organomegaly, BS present. GU: No hematuria Ext: has 1+ pitting leg edema bilaterally. 2+DP/PT pulse bilaterally. Musculoskeletal: No joint deformities, No joint redness or warmth, no limitation of ROM in spin. Skin: No rashes.  Neuro: confused, knows his own name and place, not oriented to time. Cranial nerves II-XII grossly intact, has left-sided weakness. Psych: Patient is not psychotic, no suicidal or hemocidal ideation.  Labs on Admission: I have personally reviewed following labs and imaging studies  CBC:  Recent Labs Lab 08/27/17 2241  WBC 6.0  NEUTROABS 4.3  HGB 13.3  HCT 40.5  MCV 86.0  PLT 384   Basic Metabolic Panel:  Recent Labs Lab 08/27/17 2241  NA 141  K 3.1*  CL 104  CO2 26  GLUCOSE 90  BUN 16  CREATININE 0.76  CALCIUM 9.2   GFR: CrCl cannot be calculated (Unknown ideal weight.). Liver Function Tests: No results for input(s): AST, ALT, ALKPHOS, BILITOT, PROT, ALBUMIN in the last 168 hours. No results for input(s): LIPASE, AMYLASE in the last 168  hours.  Recent Labs Lab 08/27/17 2345  AMMONIA 29   Coagulation Profile: No results for input(s): INR, PROTIME in the last 168 hours. Cardiac Enzymes:  Recent Labs Lab 08/27/17 2241  CKTOTAL 3,599*   BNP (last 3 results) No results for input(s): PROBNP in the last 8760 hours. HbA1C: No results for input(s): HGBA1C in the last 72 hours. CBG: No results for input(s): GLUCAP in the last 168 hours. Lipid Profile: No results for input(s): CHOL, HDL, LDLCALC, TRIG, CHOLHDL, LDLDIRECT in the last 72 hours. Thyroid Function Tests: No results for input(s): TSH, T4TOTAL, FREET4, T3FREE, THYROIDAB in the last 72 hours. Anemia Panel: No results for input(s): VITAMINB12, FOLATE, FERRITIN, TIBC, IRON, RETICCTPCT in the last 72 hours. Urine analysis:  Component Value Date/Time   COLORURINE YELLOW 06/06/2017 1628   APPEARANCEUR CLEAR 06/06/2017 1628   LABSPEC 1.024 06/06/2017 1628   PHURINE 5.0 06/06/2017 1628   GLUCOSEU NEGATIVE 06/06/2017 1628   HGBUR MODERATE (A) 06/06/2017 1628   BILIRUBINUR NEGATIVE 06/06/2017 1628   KETONESUR NEGATIVE 06/06/2017 1628   PROTEINUR 30 (A) 06/06/2017 1628   UROBILINOGEN 0.2 02/13/2015 1957   NITRITE NEGATIVE 06/06/2017 1628   LEUKOCYTESUR NEGATIVE 06/06/2017 1628   Sepsis Labs: @LABRCNTIP (procalcitonin:4,lacticidven:4) )No results found for this or any previous visit (from the past 240 hour(s)).   Radiological Exams on Admission: Ct Head Wo Contrast  Result Date: 08/27/2017 CLINICAL DATA:  Headache after fall yesterday.  Initial encounter. EXAM: CT HEAD WITHOUT CONTRAST TECHNIQUE: Contiguous axial images were obtained from the base of the skull through the vertex without intravenous contrast. COMPARISON:  06/07/2017 MRI FINDINGS: Brain: Mild involutional changes of brain, consistent with patient age. No acute intracranial hemorrhage, midline shift or edema. Mild small vessel ischemic disease of periventricular and subcortical white matter. No  intra-axial mass nor extra-axial collections. Vascular: No hyperdense vessels. No mild atherosclerosis of carotid siphons bilaterally. Skull: No skull fracture. Sinuses/Orbits: No acute sinus disease. Intact orbits and globes. Clear mastoids. Other: None IMPRESSION: Chronic appearing small vessel ischemic disease. No acute intracranial abnormality. Electronically Signed   By: Ashley Royalty M.D.   On: 08/27/2017 22:44   Dg Shoulder Left  Result Date: 08/27/2017 CLINICAL DATA:  Fall with diffuse shoulder pain EXAM: LEFT SHOULDER - 2+ VIEW COMPARISON:  05/02/2017 FINDINGS: No fracture or dislocation. The [facility] joint appears intact. Punctate calcification at the left humeral head. Probable elevated left diaphragm. IMPRESSION: 1. No acute osseous abnormality 2. Small amount of calcific tendinitis Electronically Signed   By: Donavan Foil M.D.   On: 08/27/2017 15:35     EKG: Not done in ED, will get one.   Assessment/Plan Principal Problem:   Rhabdomyolysis Active Problems:   Essential hypertension   Fall   Acute metabolic encephalopathy   Fall at home   Dementia with psychosis   Hypokalemia   Stroke (cerebrum) (HCC)   Rhabdomyolysis: CK 3599, this is due to fall and immobilization. Renal function normal.  -will admit to tele bed as inpt -IVF: 125 cc/h of NS -repeat CK in AM -f/u renal fx by BMP  Fall: CT-head is negative for acute intracranial abnormalities. Patient has history of left-sided weakness, which may be the potential reason why patient has frequent fall. He lives alone and dose not seem to be able to take care of himself. -pt/ot -will consult to CM and SW for possible SNF placement  Essential hypertension: -continue metoprolol, HCTZ -IV hydralazine when necessary  Dementia with psychosis: -continue depakote  Depression: Stable, no suicidal or homicidal ideations. -Continue home medications: Seroquel  Left shoulder pain: x-ray showed small amount of calcific tendinitis,  but not bony fracture. -continue prn tramadol  Hypokalemia: K= 3.1on admission. - Repleted - Check Mg level  Hx of Stroke (cerebrum) ([place]): -continue aspirin  Acute metabolic encephalopathy: etiology is not clear. Not sure about his baseline mental status. CT head is negative for acute intracranial abnormalities. May be due to delirium and dementia -Frequent neuro check -bed side swallowing screening -f/u UA to r/o UTI   DVT ppx: SQ Lovenox Code Status: Full code Family Communication: None at bed side.     Disposition Plan: to be determined Consults called:  None Admission status:  Inpatient/tele     Date of Service 08/28/2017  Ivor Costa [facility] Pager [phone]  If 7PM-7AM, please contact night-coverage [email or web address] Password [email or web address] 08/28/2017, 3:03 AM

## 2017-08-28 NOTE — ED Notes (Signed)
Holding all PO meds at this time, Pt failed stroke swallow screen, she still is somewhat confused bordering obtunded.

## 2017-08-29 LAB — CBC
HCT: 32.7 % — ABNORMAL LOW (ref 39.0–52.0)
Hemoglobin: 10.6 g/dL — ABNORMAL LOW (ref 13.0–17.0)
MCH: 28.3 pg (ref 26.0–34.0)
MCHC: 32.4 g/dL (ref 30.0–36.0)
MCV: 87.4 fL (ref 78.0–100.0)
PLATELETS: 218 10*3/uL (ref 150–400)
RBC: 3.74 MIL/uL — ABNORMAL LOW (ref 4.22–5.81)
RDW: 14.8 % (ref 11.5–15.5)
WBC: 3.6 10*3/uL — AB (ref 4.0–10.5)

## 2017-08-29 LAB — BASIC METABOLIC PANEL
ANION GAP: 8 (ref 5–15)
Anion gap: 8 (ref 5–15)
BUN: 8 mg/dL (ref 6–20)
BUN: 7 mg/dL (ref 6–20)
CALCIUM: 7.9 mg/dL — AB (ref 8.9–10.3)
CHLORIDE: 102 mmol/L (ref 101–111)
CO2: 28 mmol/L (ref 22–32)
CO2: 28 mmol/L (ref 22–32)
CREATININE: 0.75 mg/dL (ref 0.61–1.24)
CREATININE: 0.85 mg/dL (ref 0.61–1.24)
Calcium: 8.3 mg/dL — ABNORMAL LOW (ref 8.9–10.3)
Chloride: 105 mmol/L (ref 101–111)
GFR calc Af Amer: >60 mL/min (ref >60)
GFR calc Af Amer: >60 mL/min (ref >60)
GFR calc non Af Amer: >60 mL/min (ref >60)
GLUCOSE: 108 mg/dL — AB (ref 65–99)
Glucose, Bld: 88 mg/dL (ref 65–99)
Potassium: 2.7 mmol/L — CL (ref 3.5–5.1)
Potassium: 3.1 mmol/L — ABNORMAL LOW (ref 3.5–5.1)
SODIUM: 138 mmol/L (ref 135–145)
Sodium: 141 mmol/L (ref 135–145)

## 2017-08-29 LAB — GLUCOSE, CAPILLARY: Glucose-Capillary: 81 mg/dL (ref 65–99)

## 2017-08-29 LAB — CK: Total CK: 1843 U/L — ABNORMAL HIGH (ref 49–397)

## 2017-08-29 MED ORDER — POTASSIUM CHLORIDE ER 10 MEQ PO TBCR: 10.0000 meq | EXTENDED_RELEASE_TABLET | Freq: Every day | ORAL | 0 refills | Status: DC

## 2017-08-29 MED ORDER — POTASSIUM CHLORIDE 10 MEQ/100ML IV SOLN
10.0000 meq | INTRAVENOUS | Status: AC
  Administered 2017-08-29 (×4): 10 meq via INTRAVENOUS
  Filled 2017-08-29 (×4): qty 100

## 2017-08-29 MED ORDER — POTASSIUM CHLORIDE CRYS ER 20 MEQ PO TBCR
40.0000 meq | EXTENDED_RELEASE_TABLET | Freq: Two times a day (BID) | ORAL | Status: AC
  Administered 2017-08-29: 40 meq via ORAL
  Filled 2017-08-29 (×2): qty 2

## 2017-08-29 MED ORDER — POTASSIUM CHLORIDE CRYS ER 20 MEQ PO TBCR
40.0000 meq | EXTENDED_RELEASE_TABLET | Freq: Once | ORAL | Status: AC
  Administered 2017-08-29: 40 meq via ORAL
  Filled 2017-08-29: qty 2

## 2017-08-29 NOTE — Discharge Summary (Signed)
Physician Discharge Summary  Evan Charles YPP:[id number] DOB: 31-Jan-1946 DOA: 08/27/2017  PCP: [facility]  Admit date: 08/27/2017 Discharge date: 08/29/2017  Admitted From: Home Disposition: SNF  Recommendations for Outpatient Follow-up:  1. Follow up with PCP in 1-2 weeks 2. Please obtain BMP repeat in 3 days  Home Health:No Equipment/Devices:None  Discharge Condition:Stable CODE STATUS:Full Diet recommendation: Heart Healthy  Brief/Interim Summary:  Evan Charles is a 71 y.o. male with medical history significant of hypertension, stroke, GERD, depression, spinal stenosis, prostate cancer, dementia with psychosis, colitis, rhabdomyolysis, [name], multiple fall, who presents with fall and altered mental status.  Patient has dementia and AMS,  and is unable to provide accurate medical history, therefore, most of the history is obtained by discussing the case with ED physician, per EMS report, and with the nursing staff.   Patient was admitted with rhabdomyolysis and was noted to have an initial CK level of 3599 and this is now decreased 1843 after aggressive IV fluid administration. He denies any symptomatic complaints or concerns at this time and was noted to have some hypokalemia this morning with a potassium of 2.7 for which he is receiving oral and IV supplementation with a total of over 100 mEq. He will be discharged on 10 mEq of potassium daily and BMP needs to be performed in 1-2 days.   Discharge Diagnoses:  Principal Problem:   Rhabdomyolysis Active Problems:   Essential hypertension   Fall   Acute metabolic encephalopathy   Fall at home   Dementia with psychosis   Hypokalemia   Stroke (cerebrum) [facility])    Discharge Instructions  Discharge Instructions    Diet - low sodium heart healthy    Complete by:  As directed    Increase activity slowly    Complete by:  As directed      Allergies as of 08/29/2017   No Known Allergies      Medication List    TAKE these medications   aspirin 325 MG tablet Take 1 tablet (325 mg total) by mouth daily.   divalproex 250 MG DR tablet Commonly known as:  DEPAKOTE Take 1 tablet (250 mg total) by mouth at bedtime.   docusate sodium 100 MG capsule Commonly known as:  COLACE Take 1 capsule (100 mg total) by mouth 2 (two) times daily as needed for moderate constipation.   hydrochlorothiazide 12.5 MG capsule Commonly known as:  MICROZIDE Take 1 capsule (12.5 mg total) by mouth daily.   metoprolol tartrate 25 MG tablet Commonly known as:  LOPRESSOR Take 1 tablet (25 mg total) by mouth 2 (two) times daily.   polyethylene glycol packet Commonly known as:  MIRALAX / GLYCOLAX Take 17 g by mouth daily as needed for mild constipation.   potassium chloride 10 MEQ tablet Commonly known as:  K-DUR Take 1 tablet (10 mEq total) by mouth daily.   QUEtiapine 50 MG tablet Commonly known as:  SEROQUEL Take 1 tablet (50 mg total) by mouth at bedtime.   senna 8.6 MG Tabs tablet Commonly known as:  SENOKOT Take 1 tablet (8.6 mg total) by mouth at bedtime.   traMADol 50 MG tablet Commonly known as:  ULTRAM Take 50 mg by mouth every 8 (eight) hours as needed (for pain).      Contact information for after-discharge care    Destination    [facility] .   Specialty:  [place] information: [address]  [address] [phone]             No Known Allergies  Consultations:  None   Procedures/Studies: Ct Head Wo Contrast  Result Date: 08/27/2017 CLINICAL DATA:  Headache after fall yesterday.  Initial encounter. EXAM: CT HEAD WITHOUT CONTRAST TECHNIQUE: Contiguous axial images were obtained from the base of the skull through the vertex without intravenous contrast. COMPARISON:  06/07/2017 MRI FINDINGS: Brain: Mild involutional changes of brain, consistent with patient age. No acute intracranial  hemorrhage, midline shift or edema. Mild small vessel ischemic disease of periventricular and subcortical white matter. No intra-axial mass nor extra-axial collections. Vascular: No hyperdense vessels. No mild atherosclerosis of carotid siphons bilaterally. Skull: No skull fracture. Sinuses/Orbits: No acute sinus disease. Intact orbits and globes. Clear mastoids. Other: None IMPRESSION: Chronic appearing small vessel ischemic disease. No acute intracranial abnormality. Electronically Signed   By: Ashley Royalty M.D.   On: 08/27/2017 22:44   Dg Shoulder Left  Result Date: 08/27/2017 CLINICAL DATA:  Fall with diffuse shoulder pain EXAM: LEFT SHOULDER - 2+ VIEW COMPARISON:  05/02/2017 FINDINGS: No fracture or dislocation. The [facility] joint appears intact. Punctate calcification at the left humeral head. Probable elevated left diaphragm. IMPRESSION: 1. No acute osseous abnormality 2. Small amount of calcific tendinitis Electronically Signed   By: Donavan Foil M.D.   On: 08/27/2017 15:35    (Echo, Carotid, EGD, Colonoscopy, ERCP)    Subjective:   Discharge Exam: Vitals:   08/29/17 1030 08/29/17 1208  BP: 135/74 (!) 144/92  Pulse: 70 72  Resp:  16  Temp:  98.4 F (36.9 C)  SpO2:  98%   Vitals:   08/28/17 2201 08/29/17 0526 08/29/17 1030 08/29/17 1208  BP: 127/84 132/76 135/74 (!) 144/92  Pulse: 80 71 70 72  Resp: 18 18  16   Temp: 98 F (36.7 C) 98 F (36.7 C)  98.4 F (36.9 C)  TempSrc: Oral Oral  Axillary  SpO2: 94% 92%  98%  Weight:      Height:        General: Pt is alert, awake, not in acute distress Cardiovascular: RRR, S1/S2 +, no rubs, no gallops Respiratory: CTA bilaterally, no wheezing, no rhonchi Abdominal: Soft, NT, ND, bowel sounds + Extremities: no edema, no cyanosis    The results of significant diagnostics from this hospitalization (including imaging, microbiology, ancillary and laboratory) are listed below for reference.     Microbiology: No results found for  this or any previous visit (from the past 240 hour(s)).   Labs: BNP (last 3 results)  Recent Labs  04/28/17 1231 05/29/17 1411 05/31/17 1742  BNP 7.8 10.7 66.0   Basic Metabolic Panel:  Recent Labs Lab 08/27/17 2241 08/28/17 0521 08/29/17 0503  NA 141 141 141  K 3.1* 3.0* 2.7*  CL 104 103 105  CO2 26 26 28   GLUCOSE 90 86 88  BUN 16 15 8   CREATININE 0.76 0.76 0.75  CALCIUM 9.2 8.3* 7.9*  MG  --  1.8  --    Liver Function Tests: No results for input(s): AST, ALT, ALKPHOS, BILITOT, PROT, ALBUMIN in the last 168 hours. No results for input(s): LIPASE, AMYLASE in the last 168 hours.  Recent Labs Lab 08/27/17 2345  AMMONIA 29   CBC:  Recent Labs Lab 08/27/17 2241 08/28/17 0521 08/29/17 0503  WBC 6.0 4.9 3.6*  NEUTROABS 4.3  --   --   HGB 13.3 11.4* 10.6*  HCT 40.5 34.3* 32.7*  MCV 86.0 86.2 87.4  PLT 271 254 218   Cardiac Enzymes:  Recent Labs Lab 08/27/17 2241 08/28/17 0521 08/28/17 1250 08/29/17 0503  CKTOTAL 3,599* 3,062* 2,907* 1,843*   BNP: Invalid input(s): POCBNP CBG:  Recent Labs Lab 08/28/17 0929 08/29/17 0800  GLUCAP 90 81   D-Dimer No results for input(s): DDIMER in the last 72 hours. Hgb A1c No results for input(s): HGBA1C in the last 72 hours. Lipid Profile No results for input(s): CHOL, HDL, LDLCALC, TRIG, CHOLHDL, LDLDIRECT in the last 72 hours. Thyroid function studies No results for input(s): TSH, T4TOTAL, T3FREE, THYROIDAB in the last 72 hours.  Invalid input(s): FREET3 Anemia work up No results for input(s): VITAMINB12, FOLATE, FERRITIN, TIBC, IRON, RETICCTPCT in the last 72 hours. Urinalysis    Component Value Date/Time   COLORURINE YELLOW 08/28/2017 0900   APPEARANCEUR CLEAR 08/28/2017 0900   LABSPEC 1.029 08/28/2017 0900   PHURINE 5.0 08/28/2017 0900   GLUCOSEU NEGATIVE 08/28/2017 0900   HGBUR NEGATIVE 08/28/2017 0900   BILIRUBINUR NEGATIVE 08/28/2017 0900   KETONESUR 80 (A) 08/28/2017 0900   PROTEINUR 30  (A) 08/28/2017 0900   UROBILINOGEN 0.2 02/13/2015 1957   NITRITE NEGATIVE 08/28/2017 0900   LEUKOCYTESUR NEGATIVE 08/28/2017 0900   Sepsis Labs Invalid input(s): PROCALCITONIN,  WBC,  LACTICIDVEN Microbiology No results found for this or any previous visit (from the past 240 hour(s)).   Time coordinating discharge: Over 30 minutes  SIGNED:   Rodena Goldmann, DO  [facility] 08/29/2017, 3:24 PM Pager [phone]  If 7PM-7AM, please contact night-coverage [email or web address] Password [email or web address]

## 2017-08-29 NOTE — Progress Notes (Signed)
CSW received consult for assistance with SNF placement. Met with pt however he is oriented to person and place only. (Dementia per chart). Unable to reach pt's wife- left voicemail. Spoke with pt's daughter who states she and family would be agreeable to have pt re-admitted to a SNF (was at [facility] over the summer this year). States she will try to get in contact with wife as well but states she feels family would prefer [facility] again or [facility].  States pt "went home too early from rehab we think. He fell again, he's not getting around well and his wife isn't able to help him as much as he needs." Completed FL2 and made referrals. Will follow up with bed offers.   Sharren Bridge, MSW, LCSW Clinical Social Work 08/29/2017 [phone]

## 2017-08-29 NOTE — Progress Notes (Signed)
CRITICAL VALUE ALERT  Critical Value:  K 2.7  Date & Time Notied:  10/18 0625  Provider Notified: Hilbert Bible, NP  Orders Received/Actions taken: pending

## 2017-08-29 NOTE — Progress Notes (Signed)
CSW following for disposition. Note: pt's first contact in chart is Evan Charles- spouse. Pt and his daughter explain they are now divorced.  Pt states his nephew Evan Charles and daughter Evan Charles are his contacts. Numbers in demographics. Pt was earlier today referred to SNF for rehab at [place]. Pt/family both agreeable at that time, however, CSW received information from [facility] (pt's choice for SNF) that he owes approximately $1800 from his last stay there. Also is in co-pay days for his [facility] benefit, therefore to admit to any SNF would be billed $160 per day for 29 days (after that pt would again have no copay if he continued to qualify for skilled rehab). Relayed all of the above information to pt and his family. Pt states he is not agreeable to admitting to SNF as he cannot pay copay. Family confirms this. State pt "manages his own finances and makes his own decisions." Nephew states, "He has done this before. He always wants to go home. We can't stop him. But he is alone there." CSW explored possibility of hiring 24/7 care givers and pt/ family refuse this also.  Pt/nephew/CSW discussed pt's care needs at length. Family maintains no family can offer pt assistance at home. Pt noted to have needed maximum assistance with ambulating during therapy sessions in house. Pt states, "They caught me on a bad day, some days I can walk, you all just haven't seen it."  Pt adamant his plan is to return home.  CSW discussed above with team (RN, attending). Concern for capacity to make medical decisions exist at pt demonstrates fluctuating orientation and does have documented hx of dementia. Per attending, psychiatry consulted for capacity to make medical decisions evaluation.  CSW will continue following.   Evan Charles, MSW, LCSW Clinical Social Work 08/29/2017 [phone]

## 2017-08-29 NOTE — Progress Notes (Signed)
OT Cancellation Note  Patient Details Name: Evan Charles MRN: [id number] DOB: 08-14-1946   Cancelled Treatment:    Reason Eval/Treat Not Completed: Other (comment)  Per chart pt has dementia and is going to SNF- will defer to SNF [facility] [place] Payton Mccallum D 08/29/2017, 3:14 PM

## 2017-08-29 NOTE — Progress Notes (Signed)
PROGRESS NOTE    Evan Charles  QQP:[id number] DOB: August 24, 1946 DOA: 08/27/2017 PCP: [facility]   Brief Narrative:   Evan Charles a 71 y.o.malewith medical history significant of hypertension, stroke, GERD, depression, spinal stenosis, prostate cancer, dementia with psychosis, colitis, rhabdomyolysis, [name], multiple fall, who presents with fall and altered mental status.  Patient has dementia and AMS, and is unable to provide accurate medical history, therefore, most of the history is obtained by discussing the case with ED physician, per EMS report, and with the nursing staff.   Patient was admitted with rhabdomyolysis and was noted to have an initial CK level of 3599 and this is now decreased 1843 after aggressive IV fluid administration. He denies any symptomatic complaints or concerns at this time and was noted to have some hypokalemia this morning with a potassium of 2.7 for which he is receiving oral and IV supplementation with a total of over 100 mEq.   He was being evaluated for discharge to skilled nursing facility, but there are financial issues involved and the patient still appears to be quite confused and does not appear to be capable of making his own decisions. Therefore, we will proceed with psychiatry evaluation and further replete his potassium levels as his repeat potassium is now 3.1.   Assessment & Plan:   Principal Problem:   Rhabdomyolysis Active Problems:   Essential hypertension   Fall   Acute metabolic encephalopathy   Fall at home   Dementia with psychosis   Hypokalemia   Stroke (cerebrum) (HCC)   Repeat AM labs with CK Repeat oral potassium 74meq Psych consult for evaluation of decision making capacity   DVT ppx: SQ Lovenox Code Status: Full code Family Communication: None at bed side.     Disposition Plan: to be determined Consults called:  None Admission status:  Inpatient/tele     Consultants:    psychiatry   Procedures: None  Antimicrobials:None  Objective: Vitals:   08/28/17 2201 08/29/17 0526 08/29/17 1030 08/29/17 1208  BP: 127/84 132/76 135/74 (!) 144/92  Pulse: 80 71 70 72  Resp: 18 18  16   Temp: 98 F (36.7 C) 98 F (36.7 C)  98.4 F (36.9 C)  TempSrc: Oral Oral  Axillary  SpO2: 94% 92%  98%  Weight:      Height:        Intake/Output Summary (Last 24 hours) at 08/29/17 1833 Last data filed at 08/29/17 1724  Gross per 24 hour  Intake          3064.58 ml  Output             2300 ml  Net           764.58 ml   Filed Weights   08/28/17 1300  Weight: 81.9 kg (180 lb 8.9 oz)    Examination:  General exam: Appears calm and comfortable ; confused Respiratory system: Clear to auscultation. Respiratory effort normal. Cardiovascular system: S1 & S2 heard, RRR. No JVD, murmurs, rubs, gallops or clicks. No pedal edema. Gastrointestinal system: Abdomen is nondistended, soft and nontender. No organomegaly or masses felt. Normal bowel sounds heard. Central nervous system: Alert and oriented. No focal neurological deficits. Extremities: Symmetric 5 x 5 power. Skin: No rashes, lesions or ulcers Psychiatry: Judgement and insight appear normal. Mood & affect appropriate.     Data Reviewed: I have personally reviewed following labs and imaging studies  CBC:  Recent Labs Lab 08/27/17 2241 08/28/17 0521 08/29/17 0503  WBC 6.0 4.9 3.6*  NEUTROABS 4.3  --   --   HGB 13.3 11.4* 10.6*  HCT 40.5 34.3* 32.7*  MCV 86.0 86.2 87.4  PLT 271 254 063   Basic Metabolic Panel:  Recent Labs Lab 08/27/17 2241 08/28/17 0521 08/29/17 0503 08/29/17 1446  NA 141 141 141 138  K 3.1* 3.0* 2.7* 3.1*  CL 104 103 105 102  CO2 26 26 28 28   GLUCOSE 90 86 88 108*  BUN 16 15 8 7   CREATININE 0.76 0.76 0.75 0.85  CALCIUM 9.2 8.3* 7.9* 8.3*  MG  --  1.8  --   --    GFR: Estimated Creatinine Clearance: 82.3 mL/min (by C-G formula based on SCr of 0.85 mg/dL). Liver  Function Tests: No results for input(s): AST, ALT, ALKPHOS, BILITOT, PROT, ALBUMIN in the last 168 hours. No results for input(s): LIPASE, AMYLASE in the last 168 hours.  Recent Labs Lab 08/27/17 2345  AMMONIA 29   Coagulation Profile: No results for input(s): INR, PROTIME in the last 168 hours. Cardiac Enzymes:  Recent Labs Lab 08/27/17 2241 08/28/17 0521 08/28/17 1250 08/29/17 0503  CKTOTAL 3,599* 3,062* 2,907* 1,843*   BNP (last 3 results) No results for input(s): PROBNP in the last 8760 hours. HbA1C: No results for input(s): HGBA1C in the last 72 hours. CBG:  Recent Labs Lab 08/28/17 0929 08/29/17 0800  GLUCAP 90 81   Lipid Profile: No results for input(s): CHOL, HDL, LDLCALC, TRIG, CHOLHDL, LDLDIRECT in the last 72 hours. Thyroid Function Tests: No results for input(s): TSH, T4TOTAL, FREET4, T3FREE, THYROIDAB in the last 72 hours. Anemia Panel: No results for input(s): VITAMINB12, FOLATE, FERRITIN, TIBC, IRON, RETICCTPCT in the last 72 hours. Sepsis Labs:  Recent Labs Lab 08/27/17 2253  LATICACIDVEN 1.17    No results found for this or any previous visit (from the past 240 hour(s)).       Radiology Studies: Ct Head Wo Contrast  Result Date: 08/27/2017 CLINICAL DATA:  Headache after fall yesterday.  Initial encounter. EXAM: CT HEAD WITHOUT CONTRAST TECHNIQUE: Contiguous axial images were obtained from the base of the skull through the vertex without intravenous contrast. COMPARISON:  06/07/2017 MRI FINDINGS: Brain: Mild involutional changes of brain, consistent with patient age. No acute intracranial hemorrhage, midline shift or edema. Mild small vessel ischemic disease of periventricular and subcortical white matter. No intra-axial mass nor extra-axial collections. Vascular: No hyperdense vessels. No mild atherosclerosis of carotid siphons bilaterally. Skull: No skull fracture. Sinuses/Orbits: No acute sinus disease. Intact orbits and globes. Clear  mastoids. Other: None IMPRESSION: Chronic appearing small vessel ischemic disease. No acute intracranial abnormality. Electronically Signed   By: Ashley Royalty M.D.   On: 08/27/2017 22:44        Scheduled Meds: . aspirin  325 mg Oral Daily  . divalproex  250 mg Oral QHS  . enoxaparin (LOVENOX) injection  40 mg Subcutaneous Q24H  . hydrochlorothiazide  12.5 mg Oral Daily  . metoprolol tartrate  25 mg Oral BID  . potassium chloride  40 mEq Oral Once  . potassium chloride  40 mEq Oral BID  . QUEtiapine  50 mg Oral QHS  . senna  1 tablet Oral QHS   Continuous Infusions:   LOS: 1 day    Time spent: 30 minutes    Roylene Heaton Darleen Crocker, DO [facility] Pager [phone]  If 7PM-7AM, please contact night-coverage [email or web address] Password [facility] 08/29/2017, 6:33 PM

## 2017-08-29 NOTE — NC FL2 (Signed)
[place] LEVEL OF CARE SCREENING TOOL     IDENTIFICATION  Patient Name: Evan Charles Birthdate: [birth year] Sex: male Admission Date (Current Location): 08/27/2017  [facility] and [place] Number:  Herbalist and Address:  [facility],  [place], [place]      Provider Number: [id number]  Attending Physician Name and Address:  Rodena Goldmann, DO  Relative Name and Phone Number:       Current Level of Care: Hospital Recommended Level of Care: [place] Prior Approval Number:    Date Approved/Denied:   PASRR Number: [id number]  Discharge Plan: SNF    Current Diagnoses: Patient Active Problem List   Diagnosis Date Noted  . Prediabetes 06/08/2017  . Stroke (cerebrum) ([place])   . 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 Dystonia 06/07/2017  . Weakness 06/06/2017  . Accelerated hypertension 06/06/2017  . Leg weakness 04/28/2017  . 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 Impaired mobility and ADLs   . Hypokalemia 11/05/2016  . Dementia with psychosis 09/12/2016  . Acute delirium 09/08/2016  . Normocytic anemia 09/08/2016  . Acute metabolic encephalopathy 74/94/4967  . Rhabdomyolysis 09/08/2016  . Fall at home   . Fall 07/25/2016  . Mild cognitive impairment 07/25/2016  . Immobility 03/20/2016  . Generalized weakness 03/20/2016  . Failure to thrive in adult 03/20/2016  . Neuropathy   . Essential hypertension   . Physical deconditioning   . 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 GERD without esophagitis 12/19/2015  . Bilateral lower extremity edema 12/19/2015  . Intractable low back pain 02/14/2015  . Difficulty walking 02/14/2015  . H/O: upper GI bleed   . Visual hallucinations   . Psychoses ([place])     Orientation RESPIRATION BLADDER Height & Weight     Self, Place  Normal Incontinent, External catheter Weight: 180  lb 8.9 oz (81.9 kg) Height:  5\' 10"  (177.8 cm)  BEHAVIORAL SYMPTOMS/MOOD NEUROLOGICAL BOWEL NUTRITION STATUS      Continent Diet  AMBULATORY STATUS COMMUNICATION OF NEEDS Skin   Extensive Assist Verbally Normal                       Personal Care Assistance Level of Assistance  Bathing, Feeding, Dressing Bathing Assistance: Limited assistance Feeding assistance: Limited assistance Dressing Assistance: Limited assistance     Functional Limitations Info  Sight, Hearing, Speech Sight Info: Adequate Hearing Info: Adequate Speech Info: Adequate    SPECIAL CARE FACTORS FREQUENCY  PT (By licensed PT), OT (By licensed OT)     PT Frequency: 5x OT Frequency: 5x            Contractures Contractures Info: Not present    Additional Factors Info  Code Status, Allergies Code Status Info: full code Allergies Info: nka           Current Medications (08/29/2017):  This is the current hospital active medication list Current Facility-Administered Medications  Medication Dose Route Frequency Provider Last Rate Last Dose  . acetaminophen (TYLENOL) tablet 650 mg  650 mg Oral Q6H PRN Ivor Costa, MD       Or  . acetaminophen (TYLENOL) suppository 650 mg  650 mg Rectal Q6H PRN Ivor Costa, MD      . aspirin tablet 325 mg  325 mg Oral Daily Ivor Costa, MD   325 mg at 08/29/17 1030  . divalproex (DEPAKOTE) DR tablet 250 mg  250 mg Oral QHS Ivor Costa, MD   250 mg at 08/28/17 2204  . docusate sodium (COLACE) capsule 100 mg  100 mg Oral BID PRN Ivor Costa, MD      . enoxaparin (LOVENOX) injection 40 mg  40 mg Subcutaneous Q24H Ivor Costa, MD   40 mg at 08/28/17 2205  . hydrALAZINE (APRESOLINE) injection 10 mg  10 mg Intravenous Q2H PRN Manuella Ghazi, Pratik D, DO      . hydrochlorothiazide (MICROZIDE) capsule 12.5 mg  12.5 mg Oral Daily Ivor Costa, MD   12.5 mg at 08/29/17 1030  . metoprolol tartrate (LOPRESSOR) tablet 25 mg  25 mg Oral BID Ivor Costa, MD   25 mg at 08/29/17 1030  . ondansetron  (ZOFRAN) tablet 4 mg  4 mg Oral Q6H PRN Ivor Costa, MD       Or  . ondansetron [facility]) injection 4 mg  4 mg Intravenous Q6H PRN Ivor Costa, MD      . polyethylene glycol (MIRALAX / GLYCOLAX) packet 17 g  17 g Oral Daily PRN Ivor Costa, MD      . potassium chloride 10 mEq in 100 mL IVPB  10 mEq Intravenous Q1 Hr x 4 Shah, Pratik D, DO 100 mL/hr at 08/29/17 1219 10 mEq at 08/29/17 1219  . potassium chloride 20 MEQ/15ML (10%) solution 40 mEq  40 mEq Oral Once Ivor Costa, MD   Stopped at 08/28/17 0248  . potassium chloride SA (K-DUR,KLOR-CON) CR tablet 40 mEq  40 mEq Oral BID Schorr, Rhetta Mura, NP      . QUEtiapine (SEROQUEL) tablet 50 mg  50 mg Oral QHS Ivor Costa, MD   50 mg at 08/28/17 2204  . senna (SENOKOT) tablet 8.6 mg  1 tablet Oral QHS Ivor Costa, MD   8.6 mg at 08/28/17 2204  . traMADol (ULTRAM) tablet 50 mg  50 mg Oral Q8H PRN Ivor Costa, MD   50 mg at 08/28/17 2204  . zolpidem (AMBIEN) tablet 5 mg  5 mg Oral QHS PRN Ivor Costa, MD         Discharge Medications: Please see discharge summary for a list of discharge medications.  Relevant Imaging Results:  Relevant Lab Results:   Additional Information SSN [id number]  Nila Nephew, LCSW

## 2017-08-30 LAB — BASIC METABOLIC PANEL
Anion gap: 7 (ref 5–15)
BUN: 7 mg/dL (ref 6–20)
CALCIUM: 8.6 mg/dL — AB (ref 8.9–10.3)
CHLORIDE: 104 mmol/L (ref 101–111)
CO2: 30 mmol/L (ref 22–32)
CREATININE: 0.78 mg/dL (ref 0.61–1.24)
Glucose, Bld: 104 mg/dL — ABNORMAL HIGH (ref 65–99)
Potassium: 3.5 mmol/L (ref 3.5–5.1)
SODIUM: 141 mmol/L (ref 135–145)

## 2017-08-30 LAB — GLUCOSE, CAPILLARY: GLUCOSE-CAPILLARY: 109 mg/dL — AB (ref 65–99)

## 2017-08-30 LAB — CK: CK TOTAL: 795 U/L — AB (ref 49–397)

## 2017-08-30 MED ORDER — SODIUM CHLORIDE 0.9 % IV SOLN
INTRAVENOUS | Status: DC
  Administered 2017-08-30 – 2017-09-02 (×4): via INTRAVENOUS

## 2017-08-30 MED ORDER — SODIUM CHLORIDE 0.9 % IV SOLN
INTRAVENOUS | Status: AC
  Administered 2017-08-30: 15:00:00 via INTRAVENOUS

## 2017-08-30 MED ORDER — QUETIAPINE FUMARATE 25 MG PO TABS
25.0000 mg | ORAL_TABLET | Freq: Every day | ORAL | Status: DC
  Administered 2017-08-30 – 2017-09-02 (×4): 25 mg via ORAL
  Filled 2017-08-30 (×4): qty 1

## 2017-08-30 NOTE — Progress Notes (Signed)
Spoke with pt concerning HH needs, pt agreed with HHRN/PT/OT/NA/SW. Referral given to [facility] to follow at home.

## 2017-08-30 NOTE — Progress Notes (Signed)
Physical Therapy Treatment Patient Details Name: Evan Charles MRN: [id number] DOB: 1946/06/14 Today's Date: 08/30/2017    History of Present Illness Pt is a 71 y.o. male with a history of spinal stenosis, seizures, visual hallucinations, and dementia.  Pt admitted from home s/p fall with shoulder pain and UTI     PT Comments    Limited participation. Patient perseverating on wanting something to eat.     Follow Up Recommendations  SNF     Equipment Recommendations  None recommended by PT    Recommendations for Other Services       Precautions / Restrictions Precautions Precautions: Fall Precaution Comments: can be agitated    Mobility  Bed Mobility Overal bed mobility: Needs Assistance Bed Mobility: Supine to Sit;Sit to Supine           General bed mobility comments: attempted to assist the patient to sitting at the bed edge, became more confused and difficult to redirect from his wanting to eat.  assisted legs onto the bed and bed alarm placed.  Transfers                    Ambulation/Gait                 Stairs            Wheelchair Mobility    Modified Rankin (Stroke Patients Only)       Balance                                            Cognition Arousal/Alertness: Awake/alert Behavior During Therapy: Agitated Overall Cognitive Status: No family/caregiver present to determine baseline cognitive functioning                                 General Comments: patient repors that he has not eaten in 2 days. became more agitated with attempts  for mobility.      Exercises      General Comments        Pertinent Vitals/Pain Pain Assessment: No/denies pain    Home Living                      Prior Function            PT Goals (current goals can now be found in the care plan section) Progress towards PT goals: Not progressing toward goals - comment (unable to participate due  to MS)    Frequency    Min 2X/week      PT Plan Current plan remains appropriate;Frequency needs to be updated    Co-evaluation              AM-PAC PT "6 Clicks" Daily Activity  Outcome Measure  Difficulty turning over in bed (including adjusting bedclothes, sheets and blankets)?: Unable Difficulty moving from lying on back to sitting on the side of the bed? : Unable Difficulty sitting down on and standing up from a chair with arms (e.g., wheelchair, bedside commode, etc,.)?: Unable Help needed moving to and from a bed to chair (including a wheelchair)?: Total Help needed walking in hospital room?: Total Help needed climbing 3-5 steps with a railing? : Total 6 Click Score: 6    End of Session   Activity Tolerance: Treatment limited secondary  to agitation Patient left: in bed;with call bell/phone within reach;with bed alarm set Nurse Communication: Mobility status PT Visit Diagnosis: Unsteadiness on feet (R26.81);History of falling (Z91.81);Difficulty in walking, not elsewhere classified (R26.2)     Time: 6924-9324 PT Time Calculation (min) (ACUTE ONLY): 10 min  Charges:  $Therapeutic Activity: 8-22 mins                    G CodesTresa Endo PT [phone]    [name] 08/30/2017, 4:41 PM

## 2017-08-30 NOTE — Progress Notes (Addendum)
Unable to discharge patient home safely at this time.  Patient is to go home by himself, yet is unable to perform any ADLs on his own.  Dr. Manuella Ghazi aware.  See previous RN note from [facility].

## 2017-08-30 NOTE — Consult Note (Signed)
[facility] Face-to-Face Psychiatry Consult   Reason for Consult:  Dementia with rhabdomyolysis and capacity evaluation. Referring Physician:  Dr. Manuella Ghazi Patient Identification: Evan Charles MRN:  [id number] Principal Diagnosis: Rhabdomyolysis Diagnosis:   Patient Active Problem List   Diagnosis Date Noted  . Prediabetes [R73.03] 06/08/2017  . Stroke (cerebrum) ([place]) [I63.9]   . Cerebral thrombosis with cerebral infarction [I63.30] 06/07/2017  . Dystonia [G24.9] 06/07/2017  . Weakness [R53.1] 06/06/2017  . Accelerated hypertension [I10] 06/06/2017  . Leg weakness [R29.898] 04/28/2017  . Spinal stenosis of lumbar region without neurogenic claudication [M48.061] 04/28/2017  . Impaired mobility and ADLs [Z74.09]   . Hypokalemia [E87.6] 11/05/2016  . 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 Acute delirium [R41.0] 09/08/2016  . Normocytic anemia [D64.9] 09/08/2016  . Acute metabolic encephalopathy [M57.84] 09/08/2016  . Rhabdomyolysis [M62.82] 09/08/2016  . Fall at home [W19.XXXA, Y92.009]   . Fall [W19.XXXA] 07/25/2016  . Mild cognitive impairment [G31.84] 07/25/2016  . Immobility [Z74.09] 03/20/2016  . Generalized weakness [R53.1] 03/20/2016  . Failure to thrive in adult [R62.7] 03/20/2016  . Neuropathy [G62.9]   . Essential hypertension [I10]   . Physical deconditioning [R53.81]   . 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 GERD without esophagitis [K21.9] 12/19/2015  . Bilateral lower extremity edema [R60.0] 12/19/2015  . Intractable low back pain [M54.5] 02/14/2015  . Difficulty walking [R26.2] 02/14/2015  . H/O: upper GI bleed [Z87.19]   . Visual hallucinations [R44.1]   . Psychoses ([place]) [F29]     Total Time spent with patient: 1 hour  Subjective:   Evan Charles is a 71 y.o. male patient admitted with rhabdomyolysis secondary to fall at home.  HPI:  Evan Charles a 71 y.o.malewith medical history significant of hypertension,  stroke, GERD, depression, spinal stenosis, prostate cancer, dementia with psychosis, colitis, rhabdomyolysis, [name], multiple fall, who presents with fall and altered mental status. Patient was admitted with rhabdomyolysis and was noted to have an initial CK level of 3599 and this is now decreased 1843 after aggressive IV fluid administration. He denies any symptomatic complaints or concerns at this time and was noted to have some hypokalemia this morning with a potassium of 2.7 for which he is receiving oral and IV supplementation with a total of over 100 mEq. He was being evaluated for discharge to skilled nursing facility, but there are financial issues involved and the patient still appears to be quite confused and does not appear to be capable of making his own decisions. Therefore, we will proceed with psychiatry evaluation and further replete his potassium levels as his repeat potassium is now 3.1.  Patient was seen, chart reviewed and case discussed with the staff RN for this face-to-face psychiatry consultation and evaluation of capacity to make his own medical decisions and living arrangements. Patient appeared poor historian and is oriented to his name and name of the hospital only. Patient reported his nephew takes care of him at home. Reportedly patient has been deteriorated since his last admission May 2018 at [facility]. Patient MMSE score 22 out of 30 which seems to be less than that during my evaluation, mostly secondary to noncompliance and non-responding to the questions. Patient also seems to be mumbling and not able to understand his responses.   Past Psychiatric History: Lewy body Dementia, behavioral disturbance with psychosis and recent admission to [facility] in May 2018. Patient scored 22/30 on MMSE. Rivastigmine was started at 1.58m BID on 03/18/2017 and was  on quetiapine 37.5 mg at bed time.   Risk to Self: Is patient at risk for suicide?: No Risk to  Others:   Prior Inpatient Therapy:   Prior Outpatient Therapy:    Past Medical History:  Past Medical History:  Diagnosis Date  . Bilateral lower extremity edema 04/2017  . Chronic mental illness    Seroquel for intermittent hallucinations, psychiatry states he has capacity  . Colitis   . Dementia   . GERD (gastroesophageal reflux disease)   . Hypertension   . Neuropathy    " MY HANDS "  . Prostate cancer ([place])   . Rhabdomyolysis 09/08/2016  . Spinal stenosis    DDD , no neurogenic claudication  . UTI (urinary tract infection) 04/2017    Past Surgical History:  Procedure Laterality Date  . KNEE ARTHROSCOPY    . LAMINECTOMY    . PROSTATECTOMY     Family History:  Family History  Problem Relation Age of Onset  . Cancer Brother   . Cancer Maternal Grandmother    Family Psychiatric  History: unknown Social History:  History  Alcohol Use No     History  Drug Use No    Social History   Social History  . Marital status: Single    Spouse name: N/A  . Number of children: N/A  . Years of education: N/A   Occupational History  . Retired    Social History Main Topics  . Smoking status: Former Smoker    Types: Cigarettes  . Smokeless tobacco: Never Used  . Alcohol use No  . Drug use: No  . Sexual activity: No   Other Topics Concern  . None   Social History Narrative   Cares for self at home. Has friends to help and uses meals on wheels.    Additional Social History:    Allergies:  No Known Allergies  Labs:  Results for orders placed or performed during the hospital encounter of 08/27/17 (from the past 48 hour(s))  CK     Status: Abnormal   Collection Time: 08/28/17 12:50 PM  Result Value Ref Range   Total CK 2,907 (H) 49 - 397 U/L  CBC     Status: Abnormal   Collection Time: 08/29/17  5:03 AM  Result Value Ref Range   WBC 3.6 (L) 4.0 - 10.5 K/uL   RBC 3.74 (L) 4.22 - 5.81 MIL/uL   Hemoglobin 10.6 (L) 13.0 - 17.0 g/dL   HCT 32.7 (L) 39.0 - 52.0 %    MCV 87.4 78.0 - 100.0 fL   MCH 28.3 26.0 - 34.0 pg   MCHC 32.4 30.0 - 36.0 g/dL   RDW 14.8 11.5 - 15.5 %   Platelets 218 150 - 400 K/uL  Basic metabolic panel     Status: Abnormal   Collection Time: 08/29/17  5:03 AM  Result Value Ref Range   Sodium 141 135 - 145 mmol/L   Potassium 2.7 (LL) 3.5 - 5.1 mmol/L    Comment: CRITICAL RESULT CALLED TO, READ BACK BY AND VERIFIED WITHJacinto Reap [facility] RN 1937 08/29/17 A NAVARRO    Chloride 105 101 - 111 mmol/L   CO2 28 22 - 32 mmol/L   Glucose, Bld 88 65 - 99 mg/dL   BUN 8 6 - 20 mg/dL   Creatinine, Ser 0.75 0.61 - 1.24 mg/dL   Calcium 7.9 (L) 8.9 - 10.3 mg/dL   GFR calc non Af Amer >60 >60 mL/min   GFR calc Af Amer >  60 >60 mL/min    Comment: (NOTE) The eGFR has been calculated using the CKD EPI equation. This calculation has not been validated in all clinical situations. eGFR's persistently <60 mL/min signify possible Chronic Kidney Disease.    Anion gap 8 5 - 15  CK     Status: Abnormal   Collection Time: 08/29/17  5:03 AM  Result Value Ref Range   Total CK 1,843 (H) 49 - 397 U/L  Glucose, capillary     Status: None   Collection Time: 08/29/17  8:00 AM  Result Value Ref Range   Glucose-Capillary 81 65 - 99 mg/dL  Basic metabolic panel     Status: Abnormal   Collection Time: 08/29/17  2:46 PM  Result Value Ref Range   Sodium 138 135 - 145 mmol/L   Potassium 3.1 (L) 3.5 - 5.1 mmol/L   Chloride 102 101 - 111 mmol/L   CO2 28 22 - 32 mmol/L   Glucose, Bld 108 (H) 65 - 99 mg/dL   BUN 7 6 - 20 mg/dL   Creatinine, Ser 0.85 0.61 - 1.24 mg/dL   Calcium 8.3 (L) 8.9 - 10.3 mg/dL   GFR calc non Af Amer >60 >60 mL/min   GFR calc Af Amer >60 >60 mL/min    Comment: (NOTE) The eGFR has been calculated using the CKD EPI equation. This calculation has not been validated in all clinical situations. eGFR's persistently <60 mL/min signify possible Chronic Kidney Disease.    Anion gap 8 5 - 15  Glucose, capillary     Status: Abnormal    Collection Time: 08/30/17  8:09 AM  Result Value Ref Range   Glucose-Capillary 109 (H) 65 - 99 mg/dL    Current Facility-Administered Medications  Medication Dose Route Frequency Provider Last Rate Last Dose  . 0.9 %  sodium chloride infusion   Intravenous Continuous Heath Lark D, DO 75 mL/hr at 08/30/17 1010    . acetaminophen (TYLENOL) tablet 650 mg  650 mg Oral Q6H PRN Ivor Costa, MD       Or  . acetaminophen (TYLENOL) suppository 650 mg  650 mg Rectal Q6H PRN Ivor Costa, MD      . aspirin tablet 325 mg  325 mg Oral Daily Ivor Costa, MD   325 mg at 08/29/17 1030  . divalproex (DEPAKOTE) DR tablet 250 mg  250 mg Oral QHS Ivor Costa, MD   250 mg at 08/29/17 2104  . docusate sodium (COLACE) capsule 100 mg  100 mg Oral BID PRN Ivor Costa, MD      . enoxaparin (LOVENOX) injection 40 mg  40 mg Subcutaneous Q24H Ivor Costa, MD   40 mg at 08/29/17 2103  . hydrALAZINE (APRESOLINE) injection 10 mg  10 mg Intravenous Q2H PRN Manuella Ghazi, Pratik D, DO      . hydrochlorothiazide (MICROZIDE) capsule 12.5 mg  12.5 mg Oral Daily Ivor Costa, MD   12.5 mg at 08/29/17 1030  . metoprolol tartrate (LOPRESSOR) tablet 25 mg  25 mg Oral BID Ivor Costa, MD   25 mg at 08/29/17 2104  . ondansetron (ZOFRAN) tablet 4 mg  4 mg Oral Q6H PRN Ivor Costa, MD       Or  . ondansetron (ZOFRAN) injection 4 mg  4 mg Intravenous Q6H PRN Ivor Costa, MD      . polyethylene glycol (MIRALAX / GLYCOLAX) packet 17 g  17 g Oral Daily PRN Ivor Costa, MD      . QUEtiapine (SEROQUEL) tablet 50  mg  50 mg Oral Gordan Payment, MD   50 mg at 08/29/17 2104  . senna (SENOKOT) tablet 8.6 mg  1 tablet Oral QHS Ivor Costa, MD   8.6 mg at 08/29/17 2104  . traMADol (ULTRAM) tablet 50 mg  50 mg Oral Q8H PRN Ivor Costa, MD   50 mg at 08/29/17 2104  . zolpidem (AMBIEN) tablet 5 mg  5 mg Oral QHS PRN Ivor Costa, MD        Musculoskeletal: Strength & Muscle Tone: decreased Gait & Station: unable to stand Patient leans: N/A  Psychiatric Specialty  Exam: Physical Exam as per history and physical  ROS unable to obtain secondary to current mental status  Blood pressure (!) 149/92, pulse 81, temperature 97.9 F (36.6 C), temperature source Oral, resp. rate 16, height 5' 10"  (1.778 m), weight 81.9 kg (180 lb 8.9 oz), SpO2 100 %.Body mass index is 25.91 kg/m.  General Appearance: Bizarre, Disheveled and Guarded  Eye Contact:  Poor  Speech:  Blocked and Slow  Volume:  Decreased  Mood:  Depressed  Affect:  Constricted and Depressed  Thought Process:  Disorganized and Irrelevant  Orientation:  Full (Time, Place, and Person)  Thought Content:  oriented to his name and being in hospital for a fall only.  Suicidal Thoughts:  No  Homicidal Thoughts:  No  Memory:  Immediate;   Fair Recent;   Poor Remote;   Poor  Judgement:  Impaired  Insight:  Shallow  Psychomotor Activity:  Psychomotor Retardation  Concentration:  Concentration: Poor and Attention Span: Poor  Recall:  Poor  Fund of Knowledge:  Fair  Language:  Good  Akathisia:  Negative  Handed:  Right  AIMS (if indicated):     Assets:  Others:  TBD  ADL's:  Impaired  Cognition:  Impaired,  Severe  Sleep:        Treatment Plan Summary: 71 years old male with the Lewy body dementia, dementia with behavioral problems and psychosis. Patient presented with rhabdomyolysis and altered mental status. Patient is a poor historian and difficult to understand his mumbling responses and poor eye contact.   Based on my evaluation patient does not meet criteria for capacity to make his own medical decisions and has altered mental status.   Patient has elevated QTC prolongation at 540 so we will reduce his Seroquel to 25 mg at bedtime only and continues to Depakote 250 mg at bedtime.   Recommend to avoid benzodiazepines and other sedating medication  Disposition: Patient does not meet criteria for psychiatric inpatient admission. Supportive therapy provided about ongoing  stressors.  Ambrose Finland, MD 08/30/2017 10:29 AM

## 2017-08-30 NOTE — Progress Notes (Signed)
PROGRESS NOTE    AUTHOR HATLESTAD  LNL:[id number] DOB: 1946/01/24 DOA: 08/27/2017 PCP: [facility]   Brief Narrative:   Evan Charles a 71 y.o.malewith medical history significant of hypertension, stroke, GERD, depression, spinal stenosis, prostate cancer, dementia with psychosis, colitis, rhabdomyolysis, [name], multiple fall, who presents with fall and altered mental status.  Patient has dementia and AMS, and is unable to provide accurate medical history, therefore, most of the history is obtained by discussing the case with ED physician, per EMS report, and with the nursing staff.   Patient was admitted with rhabdomyolysis and was noted to have an initial CK level of 3599 and this is now decreased 1843 after aggressive IV fluid administration. He denies any symptomatic complaints or concerns at this time and was noted to have some hypokalemia this morning with a potassium of 2.7 for which he is receiving oral and IV supplementation with a total of over 100 mEq.   He was being evaluated for discharge to skilled nursing facility, but there are financial issues involved and the patient still appears to be quite confused and does not appear to be capable of making his own decisions. Therefore, psychiatry has evaluated the patient and feels that he does not have decision-making capacity. The patient was going to be sent home, but unfortunately it appears that he really cannot ambulate and take care of himself and therefore will stay here for aggressive PT.   Assessment & Plan:   Principal Problem:   Rhabdomyolysis Active Problems:   Essential hypertension   Fall   Acute metabolic encephalopathy   Fall at home   Dementia with psychosis   Hypokalemia   Stroke (cerebrum) ([place])   Repeat AM labs Continue with PT Continue to work with placement IVF started SLP evaluation; NPO until eval completed    DVT ppx: SQ Lovenox Code Status: Full code Family  Communication: None at bed side.     Disposition Plan: to be determined Consults called:  None Admission status:  Inpatient/tele     Consultants:   psychiatry   Procedures: None  Antimicrobials:None  Objective: Vitals:   08/29/17 1208 08/29/17 2113 08/30/17 0602 08/30/17 1434  BP: (!) 144/92 127/73 (!) 149/92 (!) 158/95  Pulse: 72 86 81 77  Resp: 16 16 16 16   Temp: 98.4 F (36.9 C) 98.4 F (36.9 C) 97.9 F (36.6 C) 97.7 F (36.5 C)  TempSrc: Axillary Oral Oral Oral  SpO2: 98% 96% 100% 100%  Weight:      Height:        Intake/Output Summary (Last 24 hours) at 08/30/17 1913 Last data filed at 08/30/17 1900  Gross per 24 hour  Intake                0 ml  Output             4140 ml  Net            -4140 ml   Filed Weights   08/28/17 1300  Weight: 81.9 kg (180 lb 8.9 oz)    Examination:  General exam: Appears calm and comfortable ; confused Respiratory system: Clear to auscultation. Respiratory effort normal. Cardiovascular system: S1 & S2 heard, RRR. No JVD, murmurs, rubs, gallops or clicks. No pedal edema. Gastrointestinal system: Abdomen is nondistended, soft and nontender. No organomegaly or masses felt. Normal bowel sounds heard. Central nervous system: Alert and oriented. No focal neurological deficits. Extremities: Symmetric 5 x 5 power. Skin: No rashes,  lesions or ulcers Psychiatry: Judgement and insight appear normal. Mood & affect appropriate.     Data Reviewed: I have personally reviewed following labs and imaging studies  CBC:  Recent Labs Lab 08/27/17 2241 08/28/17 0521 08/29/17 0503  WBC 6.0 4.9 3.6*  NEUTROABS 4.3  --   --   HGB 13.3 11.4* 10.6*  HCT 40.5 34.3* 32.7*  MCV 86.0 86.2 87.4  PLT 271 254 825   Basic Metabolic Panel:  Recent Labs Lab 08/27/17 2241 08/28/17 0521 08/29/17 0503 08/29/17 1446 08/30/17 1144  NA 141 141 141 138 141  K 3.1* 3.0* 2.7* 3.1* 3.5  CL 104 103 105 102 104  CO2 26 26 28 28 30   GLUCOSE 90 86  88 108* 104*  BUN 16 15 8 7 7   CREATININE 0.76 0.76 0.75 0.85 0.78  CALCIUM 9.2 8.3* 7.9* 8.3* 8.6*  MG  --  1.8  --   --   --    GFR: Estimated Creatinine Clearance: 87.4 mL/min (by C-G formula based on SCr of 0.78 mg/dL). Liver Function Tests: No results for input(s): AST, ALT, ALKPHOS, BILITOT, PROT, ALBUMIN in the last 168 hours. No results for input(s): LIPASE, AMYLASE in the last 168 hours.  Recent Labs Lab 08/27/17 2345  AMMONIA 29   Coagulation Profile: No results for input(s): INR, PROTIME in the last 168 hours. Cardiac Enzymes:  Recent Labs Lab 08/27/17 2241 08/28/17 0521 08/28/17 1250 08/29/17 0503 08/30/17 1144  CKTOTAL 3,599* 3,062* 2,907* 1,843* 795*   BNP (last 3 results) No results for input(s): PROBNP in the last 8760 hours. HbA1C: No results for input(s): HGBA1C in the last 72 hours. CBG:  Recent Labs Lab 08/28/17 0929 08/29/17 0800 08/30/17 0809  GLUCAP 90 81 109*   Lipid Profile: No results for input(s): CHOL, HDL, LDLCALC, TRIG, CHOLHDL, LDLDIRECT in the last 72 hours. Thyroid Function Tests: No results for input(s): TSH, T4TOTAL, FREET4, T3FREE, THYROIDAB in the last 72 hours. Anemia Panel: No results for input(s): VITAMINB12, FOLATE, FERRITIN, TIBC, IRON, RETICCTPCT in the last 72 hours. Sepsis Labs:  Recent Labs Lab 08/27/17 2253  LATICACIDVEN 1.17    No results found for this or any previous visit (from the past 240 hour(s)).    Radiology Studies: No results found.    Scheduled Meds: . aspirin  325 mg Oral Daily  . divalproex  250 mg Oral QHS  . enoxaparin (LOVENOX) injection  40 mg Subcutaneous Q24H  . hydrochlorothiazide  12.5 mg Oral Daily  . metoprolol tartrate  25 mg Oral BID  . QUEtiapine  25 mg Oral QHS  . senna  1 tablet Oral QHS   Continuous Infusions: . sodium chloride 75 mL/hr at 08/30/17 1010  . sodium chloride 75 mL/hr at 08/30/17 1516     LOS: 2 days    Time spent: 30 minutes    Evan Heatherly Darleen Crocker,  DO [facility] Pager [phone]  If 7PM-7AM, please contact night-coverage [email or web address] Password [email or web address] 08/30/2017, 7:13 PM

## 2017-08-30 NOTE — Evaluation (Signed)
SLP Cancellation Note  Patient Details Name: Evan Charles MRN: [id number] DOB: [birth year]   Cancelled treatment:       Reason Eval/Treat Not Completed: Other (comment) (received order for swallow evaluation, will see pt as ordered over the weekend.  Luanna Salk, [facility] SLP [phone]    Macario Golds

## 2017-08-30 NOTE — Progress Notes (Addendum)
CSW following for disposition. Pt was determined to not have capacity to make medical decisions.  Pt states his next of kin is his daughter Evan Charles (involved in conversations with CSW yesterday). CSW spoke with daughter today, she agreed to be point of contact and make decisions. She states she wants pt admitted to facility but states neither he nor family will pay for SNF or other residential placement.  Discussed pt's situation at length with pt and daughter via phone while CSW in room with pt. Daughter states that while she wants pt admitted to SNF, she will defer to pt's wishes to return home. CSW made clear that this is not the optimal recommendation for aftercare by hospital staff. Daughter understands.  Pt's nephew Evan Charles also involved in care discussion. Daughter agrees to have Evan Charles transport pt home today. Evan Charles states, "We have been going through this with him for 2 years." Updated attending, RN, CM. All aware pt's family is declining SNF and declining any home health services. CSW advised daughter to reach out to DSS to see if their are any supportive resources pt would be agreeable to. Daughter accepting and agrees to follow up. CSW also advised family stay with pt at home upon DC. States they "will check on him." Informed CSW administrator of case as well.  CSW signing off  Evan Charles, MSW, [place] [employer] 08/30/2017 [phone]

## 2017-08-30 NOTE — Progress Notes (Signed)
Patient incoherent; alert, disoriented this AM.  Patient is talking to me and very easily arousable, but refuses to open his eyes or take his medicine this AM. Dr. Manuella Ghazi aware; patient has psych eval pending. Will continue to monitor.

## 2017-08-30 NOTE — Progress Notes (Signed)
Reviewed discharge instructions with patient and [name].  Patient unable to verbalize understanding of discharge instructions.  Patient is unable to drive and has no transportation for doctor's appointments or to pick up prescriptions after discharge.  Patient's nephew is unable to provide this assistance.  With 2 person assist patient unable to stand, gait continued to fall backwards, legs were trembling while standing.  Still encouraged patient to walk. Patient began to fall after 1 step forward.  Patient assisted back to bed to prevent injury.   Jacqulynn Cadet at bedside and stated that there will be not anyone to assist pt with any ADL's.   Bedside RN and CN assess d/c disposition to be highly unsafe with risk for injury and readmission to hospital.  At this current time pt is demonstrating zero ability to meet ADL's.  [name] stated that this is the worst he has ever seen patient.   CN paged MD to advocate for appropriate disposition. MD agrees with RN assessment and cancelled d/c at this time in order to prevent further harm by sending patient home to unsafe environment. [name] made aware and stated that he will not come back to hospital to pick pt up due to patient becoming violent and unable to stand. Donterius Filley A

## 2017-08-31 LAB — BASIC METABOLIC PANEL
ANION GAP: 8 (ref 5–15)
BUN: 8 mg/dL (ref 6–20)
CALCIUM: 8.7 mg/dL — AB (ref 8.9–10.3)
CO2: 29 mmol/L (ref 22–32)
CREATININE: 0.8 mg/dL (ref 0.61–1.24)
Chloride: 104 mmol/L (ref 101–111)
GFR calc Af Amer: >60 mL/min (ref >60)
GLUCOSE: 140 mg/dL — AB (ref 65–99)
Potassium: 3.4 mmol/L — ABNORMAL LOW (ref 3.5–5.1)
Sodium: 141 mmol/L (ref 135–145)

## 2017-08-31 LAB — CBC
HEMATOCRIT: 38.5 % — AB (ref 39.0–52.0)
Hemoglobin: 12.3 g/dL — ABNORMAL LOW (ref 13.0–17.0)
MCH: 27.6 pg (ref 26.0–34.0)
MCHC: 31.9 g/dL (ref 30.0–36.0)
MCV: 86.3 fL (ref 78.0–100.0)
Platelets: 265 10*3/uL (ref 150–400)
RBC: 4.46 MIL/uL (ref 4.22–5.81)
RDW: 14.5 % (ref 11.5–15.5)
WBC: 3.9 10*3/uL — ABNORMAL LOW (ref 4.0–10.5)

## 2017-08-31 LAB — GLUCOSE, CAPILLARY: GLUCOSE-CAPILLARY: 111 mg/dL — AB (ref 65–99)

## 2017-08-31 MED ORDER — POTASSIUM CHLORIDE 10 MEQ/100ML IV SOLN
10.0000 meq | INTRAVENOUS | Status: AC
  Administered 2017-08-31 (×4): 10 meq via INTRAVENOUS
  Filled 2017-08-31 (×4): qty 100

## 2017-08-31 NOTE — Evaluation (Addendum)
Occupational Therapy Evaluation Patient Details Name: Evan Charles MRN: [id number] DOB: December 26, 1945 Today's Date: 08/31/2017    History of Present Illness Pt is a 71 y.o. male with a history of spinal stenosis, seizures, visual hallucinations, and dementia.  Pt admitted from home s/p fall with shoulder pain and UTI    Clinical Impression   Pt was admitted for the above.  Per chart, he lives alone and has support from nephew for bathing and dressing.  Pt was able to sit EOB with cues for posture and occasional min A during task. Did not stand for safety with +1 based on PT and nursing notes. Will follow in acute setting with min +1-2 level goals.  If pt doesn't d/c to SNF, he will need 24/7 for safety    Follow Up Recommendations  SNF.  Noted pt may not d/c to SNF.  He needs 24/7 if not and continued OT   Equipment Recommendations  None recommended by OT    Recommendations for Other Services       Precautions / Restrictions Precautions Precautions: Fall Precaution Comments: can be agitated Restrictions Other Position/Activity Restrictions: WBAT      Mobility Bed Mobility         Supine to sit: Min assist Sit to supine: Min assist   General bed mobility comments: steadying assistance, extra time  Transfers                 General transfer comment: not attempted    Balance   Sitting-balance support: Feet supported Sitting balance-Leahy Scale: Poor Sitting balance - Comments: during functional task. Decreased attention to keeping feet on floor and shifting forward when he started to go back.  Occasional min A and mod cues                                   ADL either performed or assessed with clinical judgement   ADL Overall ADL's : Needs assistance/impaired   Eating/Feeding Details (indicate cue type and reason): pt fed himself but A LOT of food was in bed  Grooming: Oral care;Minimal assistance;Sitting Grooming Details (indicate cue  type and reason): mod cues for balance when sitting; tended to drift posteriorly.  assistance hold emesis basin; pt did not control speed/force of spitting water out Upper Body Bathing: Minimal assistance;Bed level   Lower Body Bathing: Moderate assistance;Bed level   Upper Body Dressing : Bed level;Moderate assistance   Lower Body Dressing: Maximal assistance;Sit to/from stand                 General ADL Comments: sat EOB for grooming.  Pt had tendency to lean posteriorly. Did not stand as I didn't have +2 assist.  Pt coooperative during session.     Vision   Additional Comments: not tested     Perception     Praxis Praxis Praxis-Other Comments: when pt attempted to scoot up from sitting EOB, able to power up but not scoot laterally.  Unable to assist with +1    Pertinent Vitals/Pain Pain Assessment: No/denies pain     Hand Dominance Right   Extremity/Trunk Assessment Upper Extremity Assessment Upper Extremity Assessment: Overall WFL for tasks assessed (noted a lot of spillage in bed from breakfast)           Communication Communication Communication: No difficulties   Cognition Arousal/Alertness: Awake/alert Behavior During Therapy: WFL for tasks assessed/performed Overall Cognitive Status: No  family/caregiver present to determine baseline cognitive functioning Area of Impairment: Following commands;Safety/judgement;Problem solving                   Current Attention Level: Selective   Following Commands: Follows one step commands consistently Safety/Judgement: Decreased awareness of safety;Decreased awareness of deficits   Problem Solving: Requires verbal cues General Comments: pt had difficulty problem solving how to move gown out from under him   General Comments       Exercises     Shoulder Instructions      Home Living Family/patient expects to be discharged to:: Private residence Living Arrangements: Alone                  Bathroom Shower/Tub: Walk-in shower;Tub/shower unit   Bathroom Toilet: Standard     Home Equipment: Environmental consultant - 2 wheels;Cane - single point;Bedside commode;Shower seat;Wheelchair - manual   Additional Comments: information from previous admission      Prior Functioning/Environment      ADL's / Homemaking Assistance Needed: nephew helps with errands and appts, food from Meals on Wheels   Comments: pt states he uses RW, assist from nephew for bathing, meals on wheels        OT Problem List: Decreased strength;Decreased activity tolerance;Impaired balance (sitting and/or standing);Decreased cognition;Decreased safety awareness      OT Treatment/Interventions: Self-care/ADL training;DME and/or AE instruction;Patient/family education;Balance training;Therapeutic activities;Cognitive remediation/compensation    OT Goals(Current goals can be found in the care plan section) Acute Rehab OT Goals Patient Stated Goal: No goals stated OT Goal Formulation: With patient Time For Goal Achievement: 09/07/17 Potential to Achieve Goals: Fair ADL Goals Pt Will Transfer to Toilet: with min assist;with +2 assist;stand pivot transfer;bedside commode Additional ADL Goal #1: pt will perform UB adls from sitting with supervision and set up (min cues) Additional ADL Goal #2: pt will go from sit to stand with min +2 assistance and maintain for 2 minutes with min guard for ADLs Additional ADL Goal #3: Pt will self feed in quiet environment with supervision and min spillage  OT Frequency: Min 2X/week   Barriers to D/C:            Co-evaluation              AM-PAC PT "6 Clicks" Daily Activity     Outcome Measure Help from another person eating meals?: A Little Help from another person taking care of personal grooming?: A Little Help from another person toileting, which includes using toliet, bedpan, or urinal?: A Lot Help from another person bathing (including washing, rinsing, drying)?: A  Lot Help from another person to put on and taking off regular upper body clothing?: A Little Help from another person to put on and taking off regular lower body clothing?: A Lot 6 Click Score: 15   End of Session    Activity Tolerance: Patient tolerated treatment well Patient left: in bed;with call bell/phone within reach;with bed alarm set  OT Visit Diagnosis: Unsteadiness on feet (R26.81)                Time: 8676-7209 OT Time Calculation (min): 17 min Charges:  OT General Charges $OT Visit: 1 Visit OT Evaluation $OT Eval Moderate Complexity: 1 Mod G-Codes:     [place], OTR/L [phone] 08/31/2017  Eusebia Grulke 08/31/2017, 11:51 AM

## 2017-08-31 NOTE — Evaluation (Signed)
Clinical/Bedside Swallow Evaluation Patient Details  Name: Evan Charles MRN: [id number] Date of Birth: 1946/10/28  Today's Date: 08/31/2017 Time: SLP Start Time (ACUTE ONLY): 8466 SLP Stop Time (ACUTE ONLY): 1000 SLP Time Calculation (min) (ACUTE ONLY): 35 min  Past Medical History:  Past Medical History:  Diagnosis Date  . Bilateral lower extremity edema 04/2017  . Chronic mental illness    Seroquel for intermittent hallucinations, psychiatry states he has capacity  . Colitis   . Dementia   . GERD (gastroesophageal reflux disease)   . Hypertension   . Neuropathy    " MY HANDS "  . Prostate cancer ([facility])   . Rhabdomyolysis 09/08/2016  . Spinal stenosis    DDD , no neurogenic claudication  . UTI (urinary tract infection) 04/2017   Past Surgical History:  Past Surgical History:  Procedure Laterality Date  . KNEE ARTHROSCOPY    . LAMINECTOMY    . PROSTATECTOMY     HPI:  Pt is a 71 y.o. male with a history of spinal stenosis, seizures, visual hallucinations, and dementia.  Pt admitted from home s/p fall with shoulder pain and UTI    Assessment / Plan / Recommendation Clinical Impression  Patient presents with a mild oropharyngeal dysphagia characterized by decreased mastication and oral manipulation of solids, but no overt s/s of aspiration or penetration. Patient also with cognitive impairment with impacts his swallow function and safety because of poor awareness to bolus. Patient with significant difficulty in feeding himself, with observed decreased function of hands. (OT informed) SLP Visit Diagnosis: Dysphagia, oropharyngeal phase (R13.12)    Aspiration Risk  No limitations    Diet Recommendation Thin liquid;Regular   Liquid Administration via: Cup;Straw Medication Administration: Whole meds with liquid Supervision: Staff to assist with self feeding;Intermittent supervision to cue for compensatory strategies Compensations: Minimize environmental distractions;Slow  rate;Small sips/bites Postural Changes: Seated upright at 90 degrees    Other  Recommendations Oral Care Recommendations: Oral care BID   Follow up Recommendations Skilled Nursing facility      Frequency and Duration min 2x/week  1 week       Prognosis Prognosis for Safe Diet Advancement: Good Barriers to Reach Goals: Cognitive deficits      Swallow Study   General Date of Onset: 08/27/17 HPI: Pt is a 71 y.o. male with a history of spinal stenosis, seizures, visual hallucinations, and dementia.  Pt admitted from home s/p fall with shoulder pain and UTI  Type of Study: Bedside Swallow Evaluation Previous Swallow Assessment: N/A Diet Prior to this Study: Regular;Thin liquids Temperature Spikes Noted: No Respiratory Status: Room air History of Recent Intubation: No Behavior/Cognition: Cooperative;Alert;Pleasant mood;Confused Oral Cavity Assessment: Within Functional Limits Oral Care Completed by SLP: No Oral Cavity - Dentition: Adequate natural dentition Vision: Functional for self-feeding Self-Feeding Abilities: Needs assist;Needs set up Patient Positioning: Upright in bed Baseline Vocal Quality: Normal Volitional Cough: Strong Volitional Swallow: Unable to elicit    Oral/Motor/Sensory Function Overall Oral Motor/Sensory Function: Within functional limits   Ice Chips Ice chips: Not tested   Thin Liquid Thin Liquid: Within functional limits Presentation: Cup;Straw    Nectar Thick Nectar Thick Liquid: Not tested   Honey Thick Honey Thick Liquid: Not tested   Puree Puree: Not tested   Solid   GO   Solid: Impaired Oral Phase Functional Implications: Impaired mastication;Prolonged oral transit Pharyngeal Phase Impairments: Suspected delayed [place], [place], CCC-SLP 08/31/17 12:47 PM

## 2017-08-31 NOTE — Progress Notes (Signed)
PROGRESS NOTE    Evan Charles  SEG:[id number] DOB: May 26, 1946 DOA: 08/27/2017 PCP: [facility]   Brief Narrative:   Evan Knuckles Austinis a 71 y.o.malewith medical history significant of hypertension, stroke, GERD, depression, spinal stenosis, prostate cancer, dementia with psychosis, colitis, rhabdomyolysis, [name], multiple fall, who presents with fall and altered mental status.  Patient has dementia and AMS, and is unable to provide accurate medical history, therefore, most of the history is obtained by discussing the case with ED physician, per EMS report, and with the nursing staff.   Patient was admitted with rhabdomyolysis and was noted to have an initial CK level of 3599 and this is now decreased 1843 after aggressive IV fluid administration. He denies any symptomatic complaints or concerns at this time and was noted to have some hypokalemia this morning with a potassium of 2.7 for which he is receiving oral and IV supplementation with a total of over 100 mEq.   He was being evaluated for discharge to skilled nursing facility, but there are financial issues involved and the patient still appears to be quite confused and does not appear to be capable of making his own decisions. Therefore, psychiatry has evaluated the patient on 10/19 and feels that he does not have decision-making capacity. The patient was going to be sent home, but unfortunately it appears that he really cannot ambulate and take care of himself and therefore will stay here for aggressive PT.   No further acute events noted overnight. Pt is mildly hypokalemic this am.   Assessment & Plan:   Principal Problem:   Rhabdomyolysis Active Problems:   Essential hypertension   Fall   Acute metabolic encephalopathy   Fall at home   Dementia with psychosis   Hypokalemia   Stroke (cerebrum) (HCC)   Repeat AM labs with CK, BMP, CBC, Mg IV potassium today Continue with PT Continue to work with  placement IVF as ordered SLP evaluation pending; NPO until eval completed   DVT ppx: SQ Lovenox Code Status: Full code Family Communication: None at bed side.     Disposition Plan: to be determined Consults called:  None Admission status:  Inpatient/tele     Consultants:   psychiatry   Procedures: None  Antimicrobials:None  Objective: Vitals:   08/30/17 1434 08/30/17 2145 08/31/17 0224 08/31/17 0446  BP: (!) 158/95 (!) 141/83 139/79 136/74  Pulse: 77 85 85 78  Resp: 16 18 18 18   Temp: 97.7 F (36.5 C) 98.4 F (36.9 C) 98.7 F (37.1 C) 98 F (36.7 C)  TempSrc: Oral Oral Oral Oral  SpO2: 100% 98% 98% 99%  Weight:   80 kg (176 lb 5.9 oz)   Height:   5\' 10"  (1.778 m)     Intake/Output Summary (Last 24 hours) at 08/31/17 1247 Last data filed at 08/31/17 1200  Gross per 24 hour  Intake           3972.5 ml  Output             3650 ml  Net            322.5 ml   Filed Weights   08/28/17 1300 08/31/17 0224  Weight: 81.9 kg (180 lb 8.9 oz) 80 kg (176 lb 5.9 oz)    Examination:  General exam: Appears calm and comfortable ; confused Respiratory system: Clear to auscultation. Respiratory effort normal. Cardiovascular system: S1 & S2 heard, RRR. No JVD, murmurs, rubs, gallops or clicks. No pedal edema. Gastrointestinal  system: Abdomen is nondistended, soft and nontender. No organomegaly or masses felt. Normal bowel sounds heard. Central nervous system: Alert and oriented. No focal neurological deficits. Extremities: Symmetric 5 x 5 power. Skin: No rashes, lesions or ulcers Psychiatry: Judgement and insight appear normal. Mood & affect appropriate.     Data Reviewed: I have personally reviewed following labs and imaging studies  CBC:  Recent Labs Lab 08/27/17 2241 08/28/17 0521 08/29/17 0503 08/31/17 0352  WBC 6.0 4.9 3.6* 3.9*  NEUTROABS 4.3  --   --   --   HGB 13.3 11.4* 10.6* 12.3*  HCT 40.5 34.3* 32.7* 38.5*  MCV 86.0 86.2 87.4 86.3  PLT 271 254 218  253   Basic Metabolic Panel:  Recent Labs Lab 08/28/17 0521 08/29/17 0503 08/29/17 1446 08/30/17 1144 08/31/17 0352  NA 141 141 138 141 141  K 3.0* 2.7* 3.1* 3.5 3.4*  CL 103 105 102 104 104  CO2 26 28 28 30 29   GLUCOSE 86 88 108* 104* 140*  BUN 15 8 7 7 8   CREATININE 0.76 0.75 0.85 0.78 0.80  CALCIUM 8.3* 7.9* 8.3* 8.6* 8.7*  MG 1.8  --   --   --   --    GFR: Estimated Creatinine Clearance: 87.4 mL/min (by C-G formula based on SCr of 0.8 mg/dL). Liver Function Tests: No results for input(s): AST, ALT, ALKPHOS, BILITOT, PROT, ALBUMIN in the last 168 hours. No results for input(s): LIPASE, AMYLASE in the last 168 hours.  Recent Labs Lab 08/27/17 2345  AMMONIA 29   Coagulation Profile: No results for input(s): INR, PROTIME in the last 168 hours. Cardiac Enzymes:  Recent Labs Lab 08/27/17 2241 08/28/17 0521 08/28/17 1250 08/29/17 0503 08/30/17 1144  CKTOTAL 3,599* 3,062* 2,907* 1,843* 795*   BNP (last 3 results) No results for input(s): PROBNP in the last 8760 hours. HbA1C: No results for input(s): HGBA1C in the last 72 hours. CBG:  Recent Labs Lab 08/28/17 0929 08/29/17 0800 08/30/17 0809 08/31/17 0745  GLUCAP 90 81 109* 111*   Lipid Profile: No results for input(s): CHOL, HDL, LDLCALC, TRIG, CHOLHDL, LDLDIRECT in the last 72 hours. Thyroid Function Tests: No results for input(s): TSH, T4TOTAL, FREET4, T3FREE, THYROIDAB in the last 72 hours. Anemia Panel: No results for input(s): VITAMINB12, FOLATE, FERRITIN, TIBC, IRON, RETICCTPCT in the last 72 hours. Sepsis Labs:  Recent Labs Lab 08/27/17 2253  LATICACIDVEN 1.17    No results found for this or any previous visit (from the past 240 hour(s)).    Radiology Studies: No results found.    Scheduled Meds: . aspirin  325 mg Oral Daily  . divalproex  250 mg Oral QHS  . enoxaparin (LOVENOX) injection  40 mg Subcutaneous Q24H  . hydrochlorothiazide  12.5 mg Oral Daily  . metoprolol tartrate   25 mg Oral BID  . QUEtiapine  25 mg Oral QHS  . senna  1 tablet Oral QHS   Continuous Infusions: . sodium chloride 75 mL/hr at 08/30/17 1010  . sodium chloride 75 mL/hr at 08/30/17 1516  . potassium chloride       LOS: 3 days   Time spent: 30 minutes   Alanta Scobey Darleen Crocker, DO [facility] Pager [phone]  If 7PM-7AM, please contact night-coverage [email or web address] Password [email or web address] 08/31/2017, 12:47 PM

## 2017-08-31 NOTE — Progress Notes (Signed)
OT Note:  OT had previously deferred OT evaluation to SNF.  SLP notified me that pt may not d/c to SNF.  Evaluation performed and filed. Pt is not safe to d/c alone.  Recommend SNF.  If pt/family choose home, strongly recommend 24/7 and continued OT.  Lesle Chris, OTR/L [phone] 08/31/2017

## 2017-08-31 NOTE — Progress Notes (Addendum)
CSW following for pt's disposition. See previous notes for full hx- in brief pt attempted to DC home yesterday and nephew unable to safely transport pt home. Both pt's nephew and daughter state there is no one able to assist pt at home.  During psychiatry evaluation yesterday pt was determined to not have capacity to make medical decisions. Nex of kin, daughter [name], willing to make decisions for pt. Family agrees SNF is best option for pt, however pt is in copay days of SNF benefit and neither pt nor family are willing to pay cost of care.  CSW discussed this at length with pt, daughter, and CSW AD today. CSW inquired if daughter willing to sign pt in to facility and assume responsibility for cost of care and pursue legal guardianship of her father. (family does not know what pt's financial situation is- only pt has access to his monthly income and bank accounts/credt and debit cards)  Daughter asks that she be given time to discuss this with her spouse and family prior to [facility] moving forward. Understands that if she or other family unable to assume responsibility of pt, CSW will need to pursue APS involvement.  Daughter agrees to call CSW back this afternoon with family's decision. Updated CSW AD.  Sharren Bridge, MSW, LCSW Clinical Social Work 08/31/2017 [phone] weekend coverage  16:37- Pt's daughter informed CSW that neither she nor any other family member willing to assume care for pt at this time. Are not willing to sign him into facility nor make other care arrangements for him.   Note that pt was determined to not have capacity to make medical decisions 08/30/17. Having this evaluated again may be beneficial as case progresses as pt's orientation is very fluctuating.  Discussed with CSW AD. Contacted [facility]. Intake worker took information and will have on call social worker call back to take report.

## 2017-09-01 LAB — BASIC METABOLIC PANEL
Anion gap: 9 (ref 5–15)
BUN: 8 mg/dL (ref 6–20)
CALCIUM: 8.9 mg/dL (ref 8.9–10.3)
CO2: 30 mmol/L (ref 22–32)
Chloride: 99 mmol/L — ABNORMAL LOW (ref 101–111)
Creatinine, Ser: 0.83 mg/dL (ref 0.61–1.24)
Glucose, Bld: 97 mg/dL (ref 65–99)
Potassium: 4.3 mmol/L (ref 3.5–5.1)
SODIUM: 138 mmol/L (ref 135–145)

## 2017-09-01 LAB — CBC
HCT: 40.9 % (ref 39.0–52.0)
HEMOGLOBIN: 13.4 g/dL (ref 13.0–17.0)
MCH: 28.6 pg (ref 26.0–34.0)
MCHC: 32.8 g/dL (ref 30.0–36.0)
MCV: 87.4 fL (ref 78.0–100.0)
PLATELETS: 261 10*3/uL (ref 150–400)
RBC: 4.68 MIL/uL (ref 4.22–5.81)
RDW: 14.7 % (ref 11.5–15.5)
WBC: 4.5 10*3/uL (ref 4.0–10.5)

## 2017-09-01 LAB — GLUCOSE, CAPILLARY: Glucose-Capillary: 114 mg/dL — ABNORMAL HIGH (ref 65–99)

## 2017-09-01 LAB — CK: CK TOTAL: 370 U/L (ref 49–397)

## 2017-09-01 LAB — MAGNESIUM: MAGNESIUM: 1.8 mg/dL (ref 1.7–2.4)

## 2017-09-01 NOTE — Progress Notes (Signed)
PROGRESS NOTE    Evan Charles  TKW:[id number] DOB: Jun 21, 1946 DOA: 08/27/2017 PCP: [facility]   Brief Narrative:   Evan Charles a 71 y.o.malewith medical history significant of hypertension, stroke, GERD, depression, spinal stenosis, prostate cancer, dementia with psychosis, colitis, rhabdomyolysis, [name], multiple fall, who presents with fall and altered mental status.  Patient has dementia and AMS, and is unable to provide accurate medical history, therefore, most of the history is obtained by discussing the case with ED physician, per EMS report, and with the nursing staff.   Patient was admitted with rhabdomyolysis and was noted to have an initial CK level of 3599 and this is now decreased 1843 after aggressive IV fluid administration. He denies any symptomatic complaints or concerns at this time and was noted to have some hypokalemia this morning with a potassium of 2.7 for which he is receiving oral and IV supplementation with a total of over 100 mEq.   He was being evaluated for discharge to skilled nursing facility, but there are financial issues involved and the patient still appears to be quite confused and does not appear to be capable of making his own decisions. Therefore, psychiatry has evaluated the patient and feels that he does not have decision-making capacity. The patient was going to be sent home, but unfortunately it appears that he really cannot ambulate and take care of himself and therefore will stay here for aggressive PT and SW working on adult protective services evaluation.   Assessment & Plan:   Principal Problem:   Rhabdomyolysis Active Problems:   Essential hypertension   Fall   Acute metabolic encephalopathy   Fall at home   Dementia with psychosis   Hypokalemia   Stroke (cerebrum) (HCC)  Rhabdomyolysis -Resolved -CK 370 on recent labwork -Continue gentle IVF -No sign of AKI   Repeat AM labs Continue with PT;  severe hemiparesis Continue to work with placement Continue IVF due to poor oral intake; could likely DC in AM if eating SLP eval on 10/20 with some supervision for feeds approved with thin liquids    DVT ppx: SQ Lovenox Code Status: Full code Family Communication: None at bed side.     Disposition Plan: to be determined Consults called:  None Admission status:  Inpatient/tele     Consultants:   psychiatry   Procedures: None  Antimicrobials:None  Objective: Vitals:   08/31/17 0446 08/31/17 1438 08/31/17 2224 09/01/17 0650  BP: 136/74 127/74 135/87 140/81  Pulse: 78 86 76 68  Resp: 18 18 18 18   Temp: 98 F (36.7 C) 97.6 F (36.4 C) 98.4 F (36.9 C) 98 F (36.7 C)  TempSrc: Oral Oral Oral Oral  SpO2: 99% 96% 99% 100%  Weight:      Height:        Intake/Output Summary (Last 24 hours) at 09/01/17 0827 Last data filed at 09/01/17 9242  Gross per 24 hour  Intake          4573.75 ml  Output              900 ml  Net          3673.75 ml   Filed Weights   08/28/17 1300 08/31/17 0224  Weight: 81.9 kg (180 lb 8.9 oz) 80 kg (176 lb 5.9 oz)    Examination:  General exam: Appears calm and comfortable ; confused Respiratory system: Clear to auscultation. Respiratory effort normal. Cardiovascular system: S1 & S2 heard, RRR. No JVD, murmurs, rubs,  gallops or clicks. No pedal edema. Gastrointestinal system: Abdomen is nondistended, soft and nontender. No organomegaly or masses felt. Normal bowel sounds heard. Central nervous system: Alert and oriented. No focal neurological deficits. Extremities: Symmetric 5 x 5 power. Skin: No rashes, lesions or ulcers Psychiatry: Judgement and insight appear normal. Mood & affect appropriate.     Data Reviewed: I have personally reviewed following labs and imaging studies  CBC:  Recent Labs Lab 08/27/17 2241 08/28/17 0521 08/29/17 0503 08/31/17 0352 09/01/17 0441  WBC 6.0 4.9 3.6* 3.9* 4.5  NEUTROABS 4.3  --   --   --   --    HGB 13.3 11.4* 10.6* 12.3* 13.4  HCT 40.5 34.3* 32.7* 38.5* 40.9  MCV 86.0 86.2 87.4 86.3 87.4  PLT 271 254 218 265 798   Basic Metabolic Panel:  Recent Labs Lab 08/28/17 0521 08/29/17 0503 08/29/17 1446 08/30/17 1144 08/31/17 0352 09/01/17 0441  NA 141 141 138 141 141 138  K 3.0* 2.7* 3.1* 3.5 3.4* 4.3  CL 103 105 102 104 104 99*  CO2 26 28 28 30 29 30   GLUCOSE 86 88 108* 104* 140* 97  BUN 15 8 7 7 8 8   CREATININE 0.76 0.75 0.85 0.78 0.80 0.83  CALCIUM 8.3* 7.9* 8.3* 8.6* 8.7* 8.9  MG 1.8  --   --   --   --  1.8   GFR: Estimated Creatinine Clearance: 84.3 mL/min (by C-G formula based on SCr of 0.83 mg/dL). Liver Function Tests: No results for input(s): AST, ALT, ALKPHOS, BILITOT, PROT, ALBUMIN in the last 168 hours. No results for input(s): LIPASE, AMYLASE in the last 168 hours.  Recent Labs Lab 08/27/17 2345  AMMONIA 29   Coagulation Profile: No results for input(s): INR, PROTIME in the last 168 hours. Cardiac Enzymes:  Recent Labs Lab 08/28/17 0521 08/28/17 1250 08/29/17 0503 08/30/17 1144 09/01/17 0441  CKTOTAL 3,062* 2,907* 1,843* 795* 370   BNP (last 3 results) No results for input(s): PROBNP in the last 8760 hours. HbA1C: No results for input(s): HGBA1C in the last 72 hours. CBG:  Recent Labs Lab 08/28/17 0929 08/29/17 0800 08/30/17 0809 08/31/17 0745 09/01/17 0735  GLUCAP 90 81 109* 111* 114*   Lipid Profile: No results for input(s): CHOL, HDL, LDLCALC, TRIG, CHOLHDL, LDLDIRECT in the last 72 hours. Thyroid Function Tests: No results for input(s): TSH, T4TOTAL, FREET4, T3FREE, THYROIDAB in the last 72 hours. Anemia Panel: No results for input(s): VITAMINB12, FOLATE, FERRITIN, TIBC, IRON, RETICCTPCT in the last 72 hours. Sepsis Labs:  Recent Labs Lab 08/27/17 2253  LATICACIDVEN 1.17    No results found for this or any previous visit (from the past 240 hour(s)).    Radiology Studies: No results found.    Scheduled Meds: .  aspirin  325 mg Oral Daily  . divalproex  250 mg Oral QHS  . enoxaparin (LOVENOX) injection  40 mg Subcutaneous Q24H  . hydrochlorothiazide  12.5 mg Oral Daily  . metoprolol tartrate  25 mg Oral BID  . QUEtiapine  25 mg Oral QHS  . senna  1 tablet Oral QHS   Continuous Infusions: . sodium chloride 75 mL/hr at 09/01/17 0735     LOS: 4 days    Time spent: 30 minutes    Pratik Darleen Crocker, DO [facility] Pager [phone]  If 7PM-7AM, please contact night-coverage [email or web address] Password [email or web address] 09/01/2017, 8:27 AM

## 2017-09-01 NOTE — Progress Notes (Addendum)
Contacted APS after hours [phone]- intake worker took pt's name/brief reason for seeking report. States she will give information to on-call APS social worker and call this CSW back to take report.  Sharren Bridge, MSW, LCSW Clinical Social Work 09/01/2017 [phone]  14:58- received message to call on-call APS social worker at [phone]. Called x2, no voicemail option.

## 2017-09-02 LAB — BASIC METABOLIC PANEL
Anion gap: 8 (ref 5–15)
BUN: 11 mg/dL (ref 6–20)
CALCIUM: 8.8 mg/dL — AB (ref 8.9–10.3)
CO2: 30 mmol/L (ref 22–32)
CREATININE: 0.73 mg/dL (ref 0.61–1.24)
Chloride: 101 mmol/L (ref 101–111)
GFR calc Af Amer: >60 mL/min (ref >60)
GFR calc non Af Amer: >60 mL/min (ref >60)
Glucose, Bld: 104 mg/dL — ABNORMAL HIGH (ref 65–99)
Potassium: 4.2 mmol/L (ref 3.5–5.1)
SODIUM: 139 mmol/L (ref 135–145)

## 2017-09-02 LAB — GLUCOSE, CAPILLARY: GLUCOSE-CAPILLARY: 102 mg/dL — AB (ref 65–99)

## 2017-09-02 NOTE — Care Management Important Message (Signed)
Important Message  Patient Details  Name: Evan Charles MRN: [id number] Date of Birth: 07-09-1946   Medicare Important Message Given:  Yes    Kerin Salen 09/02/2017, 1:08 PMImportant Message  Patient Details  Name: Evan Charles MRN: [id number] Date of Birth: 1946/02/04   Medicare Important Message Given:  Yes    Kerin Salen 09/02/2017, 1:08 PM

## 2017-09-02 NOTE — Progress Notes (Addendum)
1:43 PM APS has called back and reports that patient already had a case opened with [facility]. Case worker:  Kamaree Wheatley:  [phone] Supervisor: not in office Next level supervisor called:  Angie Polieto:  [phone]  Awaiting call back from APS regarding next steps from leadership from APS.   LCSW following for dispositon which remains TBD  APS report completed this morning with [facility]. Awaiting review and if patient meets all three criteria for acceptance APS to call and follow up with the discharge plan if patient's accepted or declined.  Plan:  Pending APS involvement. LCSW discussed at length with APS supervisor plan for patient and next steps.  Patient's case has been opened in the community for a couple of weeks.  Patient owns his own home and pays his mortgage which is most likely the reason he is refusing to pay out of pocket for placement.  APS at this time will not petition as they feel he may have competency before the clerk as he understands why he does not want to use his funds to pay for SNF.  APS is not making any recommendations, but if patient is ready for discharge and not willing to pay and he is his own guardian he should go home. If APS files for guardianship (which they can do even out of the hospital) they would not be able to make decisions for patient until hearing which would not be scheduled until middle of November.  Patient's daughter and nephew are available, but not willing to make decisions for him as established and stated on Friday.  At this time as APS is not willing to file for guardianship and patient is his own guardian and not willing to give up funds, he will have to go home with home health. Referral for [facility]. Will discuss patient in LOS tomorrow 10/23 and see if patient can be HRI to follow to to decrease odds of readmission. LCSW discussed with CM who is aware and in  agreement. Will follow up on Tuesday with final plans for discharge.  Lane Hacker, MSW Clinical Social Work: [employer] Coverage for :  [phone]

## 2017-09-02 NOTE — Progress Notes (Signed)
Physical Therapy Treatment Patient Details Name: Evan Charles MRN: [id number] DOB: [birth year] Today's Date: 09/02/2017    History of Present Illness Pt is a 71 y.o. male with a history of spinal stenosis, seizures, visual hallucinations, and dementia.  Pt admitted from home s/p fall with shoulder pain and UTI     PT Comments     The patient is  Able to participate in  Therapy today, stood and took a few steps.  Continue PT   Follow Up Recommendations  SNF     Equipment Recommendations  None recommended by PT    Recommendations for Other Services       Precautions / Restrictions Precautions Precautions: Fall Precaution Comments: can be agitated    Mobility  Bed Mobility Overal bed mobility: Needs Assistance Bed Mobility: Supine to Sit;Sit to Supine     Supine to sit: Mod assist Sit to supine: Mod assist   General bed mobility comments: assist with trunk and legs  to sit up and return to supine.   Transfers Overall transfer level: Needs assistance Equipment used: Rolling walker (2 wheeled) Transfers: Sit to/from Stand Sit to Stand: Mod assist;+2 physical assistance;+2 safety/equipment         General transfer comment: Assist to stand  at RW, leaning posteriorly, small base  Ambulation/Gait Ambulation/Gait assistance: Max assist;+2 physical assistance;+2 safety/equipment Ambulation Distance (Feet): 3 Feet Assistive device: Rolling walker (2 wheeled)       General Gait Details: side steps x 4, 4 steps forward, then backward.   Stairs            Wheelchair Mobility    Modified Rankin (Stroke Patients Only)       Balance Overall balance assessment: Needs assistance Sitting-balance support: Feet supported Sitting balance-Leahy Scale: Poor Sitting balance - Comments: during functional task. Decreased attention to keeping right foot on floor . Frequent cues for trunk control Postural control: Posterior lean Standing balance support:  Bilateral upper extremity supported Standing balance-Leahy Scale: Poor                              Cognition Arousal/Alertness: Awake/alert Behavior During Therapy: WFL for tasks assessed/performed Overall Cognitive Status: No family/caregiver present to determine baseline cognitive functioning Area of Impairment: Orientation                 Orientation Level: Place;Time;Situation Current Attention Level: Selective   Following Commands: Follows one step commands inconsistently       General Comments: able to participate in standing today and take afew steps      Exercises      General Comments        Pertinent Vitals/Pain Pain Assessment: No/denies pain    Home Living                      Prior Function            PT Goals (current goals can now be found in the care plan section) Progress towards PT goals: Progressing toward goals    Frequency    Min 2X/week      PT Plan Current plan remains appropriate;Frequency needs to be updated    Co-evaluation              AM-PAC PT "6 Clicks" Daily Activity  Outcome Measure  Difficulty turning over in bed (including adjusting bedclothes, sheets and blankets)?: Unable Difficulty moving from lying on back  to sitting on the side of the bed? : Unable Difficulty sitting down on and standing up from a chair with arms (e.g., wheelchair, bedside commode, etc,.)?: Unable Help needed moving to and from a bed to chair (including a wheelchair)?: Total Help needed walking in hospital room?: Total Help needed climbing 3-5 steps with a railing? : Total 6 Click Score: 6    End of Session Equipment Utilized During Treatment: Gait belt Activity Tolerance: Treatment limited secondary to agitation Patient left: in bed;with call bell/phone within reach;with bed alarm set Nurse Communication: Mobility status PT Visit Diagnosis: Unsteadiness on feet (R26.81);History of falling (Z91.81);Difficulty  in walking, not elsewhere classified (R26.2)     Time: 1610-9604 PT Time Calculation (min) (ACUTE ONLY): 13 min  Charges:  $Therapeutic Activity: 8-22 mins                    G CodesTresa Endo PT [phone]    [name] 09/02/2017, 1:05 PM

## 2017-09-02 NOTE — Progress Notes (Signed)
PROGRESS NOTE    Evan Charles  GMW:[id number] DOB: 06-15-1946 DOA: 08/27/2017 PCP: [facility]   Brief Narrative: 71 y.o.malewith medical history significant of hypertension, stroke, GERD, depression, spinal stenosis, prostate cancer, dementia with psychosis, colitis, rhabdomyolysis, [name], multiple fall, who presents with fall and altered mental status. Patient was admitted with rhabdomyolysis and was noted to have an initial CK level of 3599, improved now.   He was being evaluated for discharge to skilled nursing facility, but there are financial issues involved and the patient still appears to be quite confused and does not appear to be capable of making his own decisions. Therefore, psychiatry has evaluated the patient and feels that he does not have decision-making capacity. The patient was going to be sent home, but unfortunately it appears that he really cannot ambulate and take care of himself and therefore will stay here for aggressive PT and SW working on adult protective services evaluation.  Assessment & Plan:   #fall/ Nontraumatic  Rhabdomyolysis: Improved with IV fluid. Renal function normal. Discontinue IV fluid. Encourage oral intake.  # Essential hypertension: Blood pressure acceptable. Continue to monitor. Hold hydrochlorothiazide    #  Acute metabolic encephalopathy/ Dementia with psychosis: PT/OT evaluation. Likely needs to go to a skilled nursing facility. Continue Depakote, Seroquel.  #  Hypokalemia: Improved  # h/o  Stroke (cerebrum) ([place]): Continue aspirin  DVT prophylaxis: Lovenox Code Status: Full code Family Communication: No family at bedside Disposition Plan: Currently admitted   Consultants:   None  Procedures: None Antimicrobials: None  Subjective: Seen and examined at bedside. Patient was alert awake. Denies nausea vomiting chest pain or shortness of breath.  Objective: Vitals:   09/01/17 1024 09/01/17 1336 09/01/17  2255 09/02/17 0522  BP: (!) 130/95 (!) 142/91 (!) 148/89 (!) 114/96  Pulse: 98 77 83 70  Resp:  16 18 18   Temp:  97.8 F (36.6 C) 98 F (36.7 C) 98.2 F (36.8 C)  TempSrc:  Oral Oral Oral  SpO2:  98% 100% 100%  Weight:      Height:        Intake/Output Summary (Last 24 hours) at 09/02/17 1255 Last data filed at 09/02/17 1200  Gross per 24 hour  Intake          3791.25 ml  Output             2200 ml  Net          1591.25 ml   Filed Weights   08/28/17 1300 08/31/17 0224  Weight: 81.9 kg (180 lb 8.9 oz) 80 kg (176 lb 5.9 oz)    Examination:  General exam: Appears calm and comfortable  Respiratory system: Clear to auscultation. Respiratory effort normal. No wheezing or crackle Cardiovascular system: S1 & S2 heard, RRR.  No pedal edema. Gastrointestinal system: Abdomen is  soft and nontender. Normal bowel sounds heard. Central nervous system: Alert awake and following very simple commands Skin: No rashes, lesions or ulcers Psychiatry: Judgement and insight appear impaired.     Data Reviewed: I have personally reviewed following labs and imaging studies  CBC:  Recent Labs Lab 08/27/17 2241 08/28/17 0521 08/29/17 0503 08/31/17 0352 09/01/17 0441  WBC 6.0 4.9 3.6* 3.9* 4.5  NEUTROABS 4.3  --   --   --   --   HGB 13.3 11.4* 10.6* 12.3* 13.4  HCT 40.5 34.3* 32.7* 38.5* 40.9  MCV 86.0 86.2 87.4 86.3 87.4  PLT 271 254 218 265 261  Basic Metabolic Panel:  Recent Labs Lab 08/28/17 0521  08/29/17 1446 08/30/17 1144 08/31/17 0352 09/01/17 0441 09/02/17 0425  NA 141  < > 138 141 141 138 139  K 3.0*  < > 3.1* 3.5 3.4* 4.3 4.2  CL 103  < > 102 104 104 99* 101  CO2 26  < > 28 30 29 30 30   GLUCOSE 86  < > 108* 104* 140* 97 104*  BUN 15  < > 7 7 8 8 11   CREATININE 0.76  < > 0.85 0.78 0.80 0.83 0.73  CALCIUM 8.3*  < > 8.3* 8.6* 8.7* 8.9 8.8*  MG 1.8  --   --   --   --  1.8  --   < > = values in this interval not displayed. GFR: Estimated Creatinine Clearance:  87.4 mL/min (by C-G formula based on SCr of 0.73 mg/dL). Liver Function Tests: No results for input(s): AST, ALT, ALKPHOS, BILITOT, PROT, ALBUMIN in the last 168 hours. No results for input(s): LIPASE, AMYLASE in the last 168 hours.  Recent Labs Lab 08/27/17 2345  AMMONIA 29   Coagulation Profile: No results for input(s): INR, PROTIME in the last 168 hours. Cardiac Enzymes:  Recent Labs Lab 08/28/17 0521 08/28/17 1250 08/29/17 0503 08/30/17 1144 09/01/17 0441  CKTOTAL 3,062* 2,907* 1,843* 795* 370   BNP (last 3 results) No results for input(s): PROBNP in the last 8760 hours. HbA1C: No results for input(s): HGBA1C in the last 72 hours. CBG:  Recent Labs Lab 08/29/17 0800 08/30/17 0809 08/31/17 0745 09/01/17 0735 09/02/17 0739  GLUCAP 81 109* 111* 114* 102*   Lipid Profile: No results for input(s): CHOL, HDL, LDLCALC, TRIG, CHOLHDL, LDLDIRECT in the last 72 hours. Thyroid Function Tests: No results for input(s): TSH, T4TOTAL, FREET4, T3FREE, THYROIDAB in the last 72 hours. Anemia Panel: No results for input(s): VITAMINB12, FOLATE, FERRITIN, TIBC, IRON, RETICCTPCT in the last 72 hours. Sepsis Labs:  Recent Labs Lab 08/27/17 2253  LATICACIDVEN 1.17    No results found for this or any previous visit (from the past 240 hour(s)).       Radiology Studies: No results found.      Scheduled Meds: . aspirin  325 mg Oral Daily  . divalproex  250 mg Oral QHS  . enoxaparin (LOVENOX) injection  40 mg Subcutaneous Q24H  . hydrochlorothiazide  12.5 mg Oral Daily  . metoprolol tartrate  25 mg Oral BID  . QUEtiapine  25 mg Oral QHS  . senna  1 tablet Oral QHS   Continuous Infusions: . sodium chloride 75 mL/hr at 09/02/17 1200     LOS: 5 days    Dron Tanna Furry, MD [facility] Pager [phone]  If 7PM-7AM, please contact night-coverage [email or web address] Password [email or web address] 09/02/2017, 12:55 PM

## 2017-09-03 LAB — GLUCOSE, CAPILLARY: Glucose-Capillary: 95 mg/dL (ref 65–99)

## 2017-09-03 NOTE — Progress Notes (Signed)
Patient placed on stretcher after receiving a bath.  Voided per urinal.  Ambulance service understands where to take patient to side entrance of home.  Deserae Jennings Roselie Awkward RN

## 2017-09-03 NOTE — Progress Notes (Signed)
Reviewed the discharge instructions with the patient.  Called the patient's daughter who said she would check on him intermittantly.  The patient understands that he is going home and home health will see him at his home.  Notified Dr. Carolin Sicks of concern about sending the patient home.  Concern about how patient will care for himself at home.  Notified ambulance service to come pick up patient to take patient home.  Shanina Kepple Roselie Awkward RN

## 2017-09-03 NOTE — Progress Notes (Addendum)
LCSW following for discharge plan:  Home with [facility]  LCSW followed up with APS worker:  Maxmilian Trostel:  [phone] Regarding discharge plan and APS interventions.   Patient at this time does not have a payor source to pay for co-pay days if admitted into a facility, as well as lacking medicaid for a long term payer.  APS plans to follow up in the community as they have been doing and aware of current living situation and circumstances.  Patient has been set up with home health per CM.  At this point, LCSW cannot place patient due to circumstances regarding finances and patient being his own legal guardian/decision maker. He is not willing to pay out of pocket co-pay days. He will discharge home at this time.  CSW signing off as interventions have been exhausted and will defer to APS to follow up with placement from the home. APS sent 2 way consent, thus all information sent to APS for continued needs/resources.  LCSW discussed with leadership regarding LOS and barriers to discharge/APS involvement.  Leadership supports current plan for discharge.   Lane Hacker, MSW Clinical Social Work: [employer] Coverage for :  [phone]  Informed pt's daughter of pt's pending DC home. Daughter aware and states, "I'll check on him." States she is following up with APS to work together toward care plan for pt. Attempted to reach pt's nephew also as he a support for pt, however unable to reach him and no voicemail set up.   Sharren Bridge, MSW, LCSW Clinical Social Work 09/03/2017 [phone]

## 2017-09-03 NOTE — Discharge Summary (Addendum)
Physician Discharge Summary  Evan Charles UXN:[id number] DOB: March 03, 1946 DOA: 08/27/2017  PCP: [facility]  Admit date: 08/27/2017 Discharge date: 09/03/2017  Admitted From:home Disposition:home with homecare services  Recommendations for Outpatient Follow-up:  1. Follow up with PCP in 1-2 weeks 2. Please obtain BMP/CBC in one week   Home Health:yes Equipment/Devices:none Discharge Condition:stable CODE STATUS:full code Diet recommendation:heart healthy  Brief/Interim Summary:71 y.o.malewith medical history significant of hypertension, stroke, GERD, depression, spinal stenosis, prostate cancer, dementia with psychosis, colitis, rhabdomyolysis, [name], multiple fall, who presents with fall and altered mental status. Patient was admitted with rhabdomyolysis and was noted to have an initial CK level of 3599, improved now.   He was being evaluated for discharge to skilled nursing facility, but there are financial issues involved and the patient still appears to be quite confused and does not appear to be capable of making his own decisions. Therefore, psychiatry has evaluated the patient and feels that he does not have decision-making capacity.  #fall/ Nontraumatic  Rhabdomyolysis: Improved with IV fluid. Renal function normal.  Encourage oral intake.  # Essential hypertension: Blood pressure acceptable. Continue to monitor.     #  Acute metabolic encephalopathy/ Dementia with psychosis: PT/OT evaluation. Continue Depakote, Seroquel. Discussed with the social worker regarding discharge planning. As per Education officer, museum, patient cannot go to a skilled facility at this time and we have to discharge to home with home care services. I maximized the home care services on discharge. Recommended follow up with PCP. Mental status is improving. -I have discussed with the patient regarding discharge planning. He does not want to go to any skilled facility however wants to go  home. He said he has a nephew who comes and helps him out. As per Education officer, museum, there is no other option than going home with home care services on discharge. The adult protective service will follow the patient after discharge.  I have discussed this with the patient's nurse, social worker about the discharge plan. Everyone is in agreement for current discharge plan.  #  Hypokalemia: Improved  # h/o  Stroke (cerebrum) ([place]): Continue aspirin  Discharge Diagnoses:  Principal Problem:   Rhabdomyolysis Active Problems:   Essential hypertension   Fall   Acute metabolic encephalopathy   Fall at home   Dementia with psychosis   Hypokalemia   Stroke (cerebrum) [facility])    Discharge Instructions  Discharge Instructions    Call MD for:  difficulty breathing, headache or visual disturbances    Complete by:  As directed    Call MD for:  extreme fatigue    Complete by:  As directed    Call MD for:  hives    Complete by:  As directed    Call MD for:  persistant dizziness or light-headedness    Complete by:  As directed    Call MD for:  persistant nausea and vomiting    Complete by:  As directed    Call MD for:  severe uncontrolled pain    Complete by:  As directed    Call MD for:  temperature >100.4    Complete by:  As directed    Diet - low sodium heart healthy    Complete by:  As directed    Diet - low sodium heart healthy    Complete by:  As directed    Diet - low sodium heart healthy    Complete by:  As directed    Face-to-face encounter (required for Medicare/Medicaid  patients)    Complete by:  As directed    I Dron Evan Charles certify that this patient is under my care and that I, or a nurse practitioner or physician's assistant working with me, had a face-to-face encounter that meets the physician face-to-face encounter requirements with this patient on 09/03/2017. The encounter with the patient was in whole, or in part for the following medical condition(s) which is  the primary reason for home health care (List medical condition): weakness, fall, confusion   The encounter with the patient was in whole, or in part, for the following medical condition, which is the primary reason for home health care:  weakness, fall   I certify that, based on my findings, the following services are medically necessary home health services:   Nursing Physical therapy     Reason for Medically Necessary Home Health Services:  Skilled Nursing- Skilled Assessment/Observation   My clinical findings support the need for the above services:  Unable to leave home safely without assistance and/or assistive device   Further, I certify that my clinical findings support that this patient is homebound due to:  Mental confusion   Home Health    Complete by:  As directed    To provide the following care/treatments:   PT OT [place] work     Increase activity slowly    Complete by:  As directed    Increase activity slowly    Complete by:  As directed    Increase activity slowly    Complete by:  As directed      Allergies as of 09/03/2017   No Known Allergies     Medication List    TAKE these medications   aspirin 325 MG tablet Take 1 tablet (325 mg total) by mouth daily.   divalproex 250 MG DR tablet Commonly known as:  DEPAKOTE Take 1 tablet (250 mg total) by mouth at bedtime.   docusate sodium 100 MG capsule Commonly known as:  COLACE Take 1 capsule (100 mg total) by mouth 2 (two) times daily as needed for moderate constipation.   hydrochlorothiazide 12.5 MG capsule Commonly known as:  MICROZIDE Take 1 capsule (12.5 mg total) by mouth daily.   metoprolol tartrate 25 MG tablet Commonly known as:  LOPRESSOR Take 1 tablet (25 mg total) by mouth 2 (two) times daily.   polyethylene glycol packet Commonly known as:  MIRALAX / GLYCOLAX Take 17 g by mouth daily as needed for mild constipation.   potassium chloride 10 MEQ tablet Commonly known as:   K-DUR Take 1 tablet (10 mEq total) by mouth daily.   QUEtiapine 50 MG tablet Commonly known as:  SEROQUEL Take 1 tablet (50 mg total) by mouth at bedtime.   senna 8.6 MG Tabs tablet Commonly known as:  SENOKOT Take 1 tablet (8.6 mg total) by mouth at bedtime.   traMADol 50 MG tablet Commonly known as:  ULTRAM Take 50 mg by mouth every 8 (eight) hours as needed (for pain).      Follow-up Information    Clinic, [facility]. Schedule an appointment as soon as possible for a visit in 1 week(s).   Contact information: [place] [address] [phone]          No Known Allergies  Consultations: None  Procedures/Studies: None  Subjective: Seen and examined at bedside. Denied headache, dizziness, nausea vomiting or shortness of breath.  Discharge Exam: Vitals:   09/03/17 0446 09/03/17 0900  BP: 132/68 103/67  Pulse: 88 86  Resp: 18 14  Temp: 98.4 F (36.9 C) 98.2 F (36.8 C)  SpO2: 98% 99%   Vitals:   09/02/17 1531 09/02/17 2056 09/03/17 0446 09/03/17 0900  BP: 117/82 126/76 132/68 103/67  Pulse: 81 92 88 86  Resp: 16 18 18 14   Temp: 98 F (36.7 C) 98.6 F (37 C) 98.4 F (36.9 C) 98.2 F (36.8 C)  TempSrc: Oral Oral Oral   SpO2: 98% 98% 98% 99%  Weight:      Height:        General: Pt is alert, awake, not in acute distress Cardiovascular: RRR, S1/S2 +, no rubs, no gallops Respiratory: CTA bilaterally, no wheezing, no rhonchi Abdominal: Soft, NT, ND, bowel sounds + Extremities: no edema, no cyanosis    The results of significant diagnostics from this hospitalization (including imaging, microbiology, ancillary and laboratory) are listed below for reference.     Microbiology: No results found for this or any previous visit (from the past 240 hour(s)).   Labs: BNP (last 3 results)  Recent Labs  04/28/17 1231 05/29/17 1411 05/31/17 1742  BNP 7.8 10.7 62.2   Basic Metabolic Panel:  Recent Labs Lab  08/28/17 0521  08/29/17 1446 08/30/17 1144 08/31/17 0352 09/01/17 0441 09/02/17 0425  NA 141  < > 138 141 141 138 139  K 3.0*  < > 3.1* 3.5 3.4* 4.3 4.2  CL 103  < > 102 104 104 99* 101  CO2 26  < > 28 30 29 30 30   GLUCOSE 86  < > 108* 104* 140* 97 104*  BUN 15  < > 7 7 8 8 11   CREATININE 0.76  < > 0.85 0.78 0.80 0.83 0.73  CALCIUM 8.3*  < > 8.3* 8.6* 8.7* 8.9 8.8*  MG 1.8  --   --   --   --  1.8  --   < > = values in this interval not displayed. Liver Function Tests: No results for input(s): AST, ALT, ALKPHOS, BILITOT, PROT, ALBUMIN in the last 168 hours. No results for input(s): LIPASE, AMYLASE in the last 168 hours.  Recent Labs Lab 08/27/17 2345  AMMONIA 29   CBC:  Recent Labs Lab 08/27/17 2241 08/28/17 0521 08/29/17 0503 08/31/17 0352 09/01/17 0441  WBC 6.0 4.9 3.6* 3.9* 4.5  NEUTROABS 4.3  --   --   --   --   HGB 13.3 11.4* 10.6* 12.3* 13.4  HCT 40.5 34.3* 32.7* 38.5* 40.9  MCV 86.0 86.2 87.4 86.3 87.4  PLT 271 254 218 265 261   Cardiac Enzymes:  Recent Labs Lab 08/28/17 0521 08/28/17 1250 08/29/17 0503 08/30/17 1144 09/01/17 0441  CKTOTAL 3,062* 2,907* 1,843* 795* 370   BNP: Invalid input(s): POCBNP CBG:  Recent Labs Lab 08/30/17 0809 08/31/17 0745 09/01/17 0735 09/02/17 0739 09/03/17 0734  GLUCAP 109* 111* 114* 102* 95   D-Dimer No results for input(s): DDIMER in the last 72 hours. Hgb A1c No results for input(s): HGBA1C in the last 72 hours. Lipid Profile No results for input(s): CHOL, HDL, LDLCALC, TRIG, CHOLHDL, LDLDIRECT in the last 72 hours. Thyroid function studies No results for input(s): TSH, T4TOTAL, T3FREE, THYROIDAB in the last 72 hours.  Invalid input(s): FREET3 Anemia work up No results for input(s): VITAMINB12, FOLATE, FERRITIN, TIBC, IRON, RETICCTPCT in the last 72 hours. Urinalysis    Component Value Date/Time   COLORURINE YELLOW 08/28/2017 0900   APPEARANCEUR CLEAR 08/28/2017 0900   LABSPEC  1.029 08/28/2017  0900   PHURINE 5.0 08/28/2017 0900   GLUCOSEU NEGATIVE 08/28/2017 0900   HGBUR NEGATIVE 08/28/2017 0900   BILIRUBINUR NEGATIVE 08/28/2017 0900   KETONESUR 80 (A) 08/28/2017 0900   PROTEINUR 30 (A) 08/28/2017 0900   UROBILINOGEN 0.2 02/13/2015 1957   NITRITE NEGATIVE 08/28/2017 0900   LEUKOCYTESUR NEGATIVE 08/28/2017 0900   Sepsis Labs Invalid input(s): PROCALCITONIN,  WBC,  LACTICIDVEN Microbiology No results found for this or any previous visit (from the past 240 hour(s)).   Time coordinating discharge: 26 minutes  SIGNED:   Rosita Fire, MD  [facility] 09/03/2017, 11:23 AM  If 7PM-7AM, please contact night-coverage [email or web address] Password [email or web address]

## 2017-09-05 ENCOUNTER — Emergency Department (HOSPITAL_COMMUNITY)
Admission: EM | Admit: 2017-09-05 | Discharge: 2017-09-05 | Disposition: A | Discharge status: Home / Self Care | Payer: Medicare Other | Source: Home / Self Care | Attending: Emergency Medicine | Admitting: Emergency Medicine

## 2017-09-05 ENCOUNTER — Encounter (HOSPITAL_COMMUNITY): Payer: Self-pay | Admitting: *Deleted

## 2017-09-05 DIAGNOSIS — Z5321 Procedure and treatment not carried out due to patient leaving prior to being seen by health care provider: Secondary | ICD-10-CM | POA: Insufficient documentation

## 2017-09-05 DIAGNOSIS — R7881 Bacteremia: Secondary | ICD-10-CM | POA: Diagnosis not present

## 2017-09-05 DIAGNOSIS — Z022 Encounter for examination for admission to residential institution: Secondary | ICD-10-CM | POA: Insufficient documentation

## 2017-09-05 DIAGNOSIS — S0003XA Contusion of scalp, initial encounter: Secondary | ICD-10-CM | POA: Diagnosis not present

## 2017-09-05 DIAGNOSIS — E860 Dehydration: Secondary | ICD-10-CM | POA: Diagnosis not present

## 2017-09-05 DIAGNOSIS — Z7982 Long term (current) use of aspirin: Secondary | ICD-10-CM | POA: Insufficient documentation

## 2017-09-05 DIAGNOSIS — S0990XA Unspecified injury of head, initial encounter: Secondary | ICD-10-CM | POA: Diagnosis not present

## 2017-09-05 DIAGNOSIS — Z5320 Procedure and treatment not carried out because of patient's decision for unspecified reasons: Principal | ICD-10-CM

## 2017-09-05 DIAGNOSIS — R627 Adult failure to thrive: Secondary | ICD-10-CM | POA: Diagnosis not present

## 2017-09-05 DIAGNOSIS — G629 Polyneuropathy, unspecified: Secondary | ICD-10-CM | POA: Diagnosis not present

## 2017-09-05 DIAGNOSIS — T1490XA Injury, unspecified, initial encounter: Secondary | ICD-10-CM | POA: Diagnosis not present

## 2017-09-05 DIAGNOSIS — E7220 Disorder of urea cycle metabolism, unspecified: Secondary | ICD-10-CM | POA: Diagnosis not present

## 2017-09-05 DIAGNOSIS — Z8673 Personal history of transient ischemic attack (TIA), and cerebral infarction without residual deficits: Secondary | ICD-10-CM

## 2017-09-05 DIAGNOSIS — Z8546 Personal history of malignant neoplasm of prostate: Secondary | ICD-10-CM

## 2017-09-05 DIAGNOSIS — G9341 Metabolic encephalopathy: Secondary | ICD-10-CM | POA: Diagnosis not present

## 2017-09-05 DIAGNOSIS — Z79899 Other long term (current) drug therapy: Secondary | ICD-10-CM | POA: Insufficient documentation

## 2017-09-05 DIAGNOSIS — Z0000 Encounter for general adult medical examination without abnormal findings: Secondary | ICD-10-CM | POA: Diagnosis not present

## 2017-09-05 DIAGNOSIS — I10 Essential (primary) hypertension: Secondary | ICD-10-CM

## 2017-09-05 DIAGNOSIS — M6282 Rhabdomyolysis: Secondary | ICD-10-CM | POA: Diagnosis not present

## 2017-09-05 DIAGNOSIS — G3183 Dementia with Lewy bodies: Secondary | ICD-10-CM | POA: Diagnosis not present

## 2017-09-05 DIAGNOSIS — I69354 Hemiplegia and hemiparesis following cerebral infarction affecting left non-dominant side: Secondary | ICD-10-CM | POA: Diagnosis not present

## 2017-09-05 DIAGNOSIS — Z87891 Personal history of nicotine dependence: Secondary | ICD-10-CM | POA: Insufficient documentation

## 2017-09-05 DIAGNOSIS — M4800 Spinal stenosis, site unspecified: Secondary | ICD-10-CM | POA: Diagnosis not present

## 2017-09-05 DIAGNOSIS — D72829 Elevated white blood cell count, unspecified: Secondary | ICD-10-CM | POA: Diagnosis not present

## 2017-09-05 DIAGNOSIS — R296 Repeated falls: Secondary | ICD-10-CM | POA: Diagnosis not present

## 2017-09-05 DIAGNOSIS — R410 Disorientation, unspecified: Secondary | ICD-10-CM | POA: Diagnosis not present

## 2017-09-05 DIAGNOSIS — B9689 Other specified bacterial agents as the cause of diseases classified elsewhere: Secondary | ICD-10-CM | POA: Diagnosis not present

## 2017-09-05 DIAGNOSIS — K219 Gastro-esophageal reflux disease without esophagitis: Secondary | ICD-10-CM | POA: Diagnosis not present

## 2017-09-05 DIAGNOSIS — M549 Dorsalgia, unspecified: Secondary | ICD-10-CM | POA: Diagnosis not present

## 2017-09-05 DIAGNOSIS — R05 Cough: Secondary | ICD-10-CM | POA: Diagnosis not present

## 2017-09-05 DIAGNOSIS — Z5329 Procedure and treatment not carried out because of patient's decision for other reasons: Secondary | ICD-10-CM

## 2017-09-05 DIAGNOSIS — F0390 Unspecified dementia without behavioral disturbance: Secondary | ICD-10-CM | POA: Diagnosis not present

## 2017-09-05 DIAGNOSIS — F0280 Dementia in other diseases classified elsewhere without behavioral disturbance: Secondary | ICD-10-CM | POA: Diagnosis not present

## 2017-09-05 LAB — CBG MONITORING, ED: Glucose-Capillary: 97 mg/dL (ref 65–99)

## 2017-09-05 NOTE — ED Provider Notes (Signed)
[facility] DEPT Provider Note   CSN: [id number] Arrival date & time: 09/05/17  1240     History   Chief Complaint Chief Complaint  Patient presents with  . Needs Nursing home placement    HPI Evan Charles is a 71 y.o. male who presents to the ED today for nursing home placement. The patient was recently discharged from the hospital on 09/03/2017.  At this time the patient denied going to a skilled nursing facility.  Social work's note says that there is no other option other than going home if he is refusing this care. Note says they will provide home services and adult protective services will follow.  Patient was found today by [employer] while trying to perform welfare check with patient on floor, with report of soiling himself. Patient denies this. He denies any fever, chills, abdominal pain or falls. The patient nephew checks on him but the patient is his own guardian. He has poor living situation. Social work note says the patient is his own guardian and refusing to give him money for nursing home placement. LCSW cannot pursue placement until APS has applied for guardianship and has control over finances to force payment to care for patient.   HPI  Past Medical History:  Diagnosis Date  . Bilateral lower extremity edema 04/2017  . Chronic mental illness    Seroquel for intermittent hallucinations, psychiatry states he has capacity  . Colitis   . Dementia   . GERD (gastroesophageal reflux disease)   . Hypertension   . Neuropathy    " MY HANDS "  . Prostate cancer ([place])   . Rhabdomyolysis 09/08/2016  . Spinal stenosis    DDD , no neurogenic claudication  . UTI (urinary tract infection) 04/2017    Patient Active Problem List   Diagnosis Date Noted  . Prediabetes 06/08/2017  . Stroke (cerebrum) ([place])   . 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 Dystonia 06/07/2017  . Weakness 06/06/2017  . Accelerated hypertension  06/06/2017  . Leg weakness 04/28/2017  . 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 Impaired mobility and ADLs   . Hypokalemia 11/05/2016  . Dementia with psychosis 09/12/2016  . Acute delirium 09/08/2016  . Normocytic anemia 09/08/2016  . Acute metabolic encephalopathy 42/70/6237  . Rhabdomyolysis 09/08/2016  . Fall at home   . Fall 07/25/2016  . Mild cognitive impairment 07/25/2016  . Immobility 03/20/2016  . Generalized weakness 03/20/2016  . Failure to thrive in adult 03/20/2016  . Neuropathy   . Essential hypertension   . Physical deconditioning   . 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 GERD without esophagitis 12/19/2015  . Bilateral lower extremity edema 12/19/2015  . Intractable low back pain 02/14/2015  . Difficulty walking 02/14/2015  . H/O: upper GI bleed   . Visual hallucinations   . Psychoses [facility])     Past Surgical History:  Procedure Laterality Date  . KNEE ARTHROSCOPY    . LAMINECTOMY    . PROSTATECTOMY         Home Medications    Prior to Admission medications   Medication Sig Start Date End Date Taking? Authorizing Provider  aspirin 325 MG tablet Take 1 tablet (325 mg total) by mouth daily. 06/12/17   Rama, Venetia Maxon, MD  divalproex (DEPAKOTE) 250 MG DR tablet Take 1 tablet (250 mg total) by mouth at bedtime. 07/16/17   Hendricks Limes, MD  docusate sodium (COLACE)  100 MG capsule Take 1 capsule (100 mg total) by mouth 2 (two) times daily as needed for moderate constipation. 04/30/17   Zada Finders, MD  hydrochlorothiazide (MICROZIDE) 12.5 MG capsule Take 1 capsule (12.5 mg total) by mouth daily. 07/16/17   Hendricks Limes, MD  metoprolol tartrate (LOPRESSOR) 25 MG tablet Take 1 tablet (25 mg total) by mouth 2 (two) times daily. 07/16/17   Hendricks Limes, MD  polyethylene glycol [facility] Floria Raveling) packet Take 17 g by mouth daily as needed for mild constipation. 06/11/17   Rama, Venetia Maxon, MD    potassium chloride (K-DUR) 10 MEQ tablet Take 1 tablet (10 mEq total) by mouth daily. 08/29/17   Manuella Ghazi, Pratik D, DO  QUEtiapine (SEROQUEL) 50 MG tablet Take 1 tablet (50 mg total) by mouth at bedtime. 07/16/17   Hendricks Limes, MD  senna (SENOKOT) 8.6 MG TABS tablet Take 1 tablet (8.6 mg total) by mouth at bedtime. 06/11/17   Rama, Venetia Maxon, MD  traMADol (ULTRAM) 50 MG tablet Take 50 mg by mouth every 8 (eight) hours as needed (for pain).    [provider]    Family History Family History  Problem Relation Age of Onset  . Cancer Brother   . Cancer Maternal Grandmother     Social History Social History  Substance Use Topics  . Smoking status: Former Smoker    Types: Cigarettes  . Smokeless tobacco: Never Used  . Alcohol use No     Allergies   Patient has no known allergies.   Review of Systems Review of Systems  All other systems reviewed and are negative.   Physical Exam Updated Vital Signs BP (!) 108/92 (BP Location: Right Arm)   Pulse 82   Temp 97.7 F (36.5 C) (Oral)   Resp 14   Ht 5\' 9"  (1.753 m)   Wt 83.9 kg (185 lb)   SpO2 97%   BMI 27.32 kg/m   Physical Exam  Constitutional: He is oriented to person, place, and time. He appears well-developed and well-nourished.  HENT:  Head: Normocephalic and atraumatic.  Right Ear: External ear normal.  Left Ear: External ear normal.  Nose: Nose normal.  Mouth/Throat: Uvula is midline, oropharynx is clear and moist and mucous membranes are normal. No tonsillar exudate.  Eyes: Pupils are equal, round, and reactive to light. Right eye exhibits no discharge. Left eye exhibits no discharge. No scleral icterus.  Neck: Trachea normal. Neck supple. No spinous process tenderness present. No neck rigidity. Normal range of motion present.  Cardiovascular: Normal rate, regular rhythm and intact distal pulses.   No murmur heard. Pulses:      Radial pulses are 2+ on the right side, and 2+ on the left side.        Dorsalis pedis pulses are 2+ on the right side, and 2+ on the left side.       Posterior tibial pulses are 2+ on the right side, and 2+ on the left side.  Pulmonary/Chest: Effort normal and breath sounds normal. He exhibits no tenderness.  Abdominal: Soft. Bowel sounds are normal. There is no tenderness. There is no rebound and no guarding.  Musculoskeletal: He exhibits no edema.  Lymphadenopathy:    He has no cervical adenopathy.  Neurological: He is alert and oriented to person, place, and time. He exhibits normal muscle tone.  Skin: Skin is warm and dry. No rash noted. He is not diaphoretic.  Psychiatric: He has a normal mood and affect.  Nursing note and vitals reviewed.   ED Treatments / Results  Labs (all labs ordered are listed, but only abnormal results are displayed) Labs Reviewed  CBG MONITORING, ED    EKG  EKG Interpretation None       Radiology No results found.  Procedures Procedures (including critical care time)  Medications Ordered in ED Medications - No data to display   Initial Impression / Assessment and Plan / ED Course  I have reviewed the triage vital signs and the nursing notes.  Pertinent labs & imaging results that were available during my care of the patient were reviewed by me and considered in my medical decision making (see chart for details).     Patient with poor home situation. Advised patient be placed in nursing facility but he refused.  Also advised that he stay for basic tests and workup. Social work note says the patient is his own guardian and refusing to give him money for nursing home placement. LCSW cannot pursue placement until APS has applied for guardianship and has control over finances to force payment to care for patient. Patient has decided to leave against medical advice without further workup.  We discussed the nature and purpose, risks and benefits, as well as, the alternatives of treatment. Time was given to allow the  opportunity to ask questions and consider their options, and after the discussion, the patient decided to refuse further testing and/or offered treatment. The patient was informed that refusal could lead to, but was not limited to, death, permanent disability, severe pain, worsening symptoms, disease progression. If present, I asked friends/family to dissuade them without success. Prior to refusing, I determined that the patient had the capacity to make their decision, does not appear clinically intoxicated and understood the consequences of that decision. Outpatient follow up has been encouraged and provided as needed.  Recommended patient return to the hospital if symptoms worsen, change or they would like further treatment and evaluation.  Patient case discussed with Dr. Alvino Chapel who is in agreement with plan.  Final Clinical Impressions(s) / ED Diagnoses   Final diagnoses:  Left against medical advice    New Prescriptions New Prescriptions   No medications on file     Ajeet, Casas[place] 09/05/17 1453    Davonna Belling, MD 09/05/17 [phone]

## 2017-09-05 NOTE — ED Triage Notes (Signed)
[employer] went to perform a wellfare check and found pt on floor, unclothed (laying up against his wheelchair).  He had soiled himself.  [employer] officers got him dressed and called EMS.  Pt denies any distress.  [facility] and [facility], his nephew by marriage goes to check on him.  No obvious injuries noted.  Per [employer], DSS is working on getting guardianship of pt.  His Social worker is Evan Charles [phone]. His family member is Evan Charles [phone].

## 2017-09-05 NOTE — ED Notes (Signed)
Notified pt nephew that he is going home.  Nephew states that he will go unlock door so that ambulance can take him in

## 2017-09-05 NOTE — Progress Notes (Addendum)
LCSW is aware of consult  Patient found at home after welfare check. APS worker has been called and message left  Webber Michiels [phone].  This Probation officer is very familiar with case (please see most recent discharge post 36 hours ago) and notes.  Barrier with placement: Patient is his own guardian and refusing to give up money to pay for placement. He was worked up for SNF, but has no payer source thus no bed offers or facilities would offer to him. APS is involved, did not feel the need to petition for guardianship, thus he was discharged him with APS to follow up along with home health. Pt reports that he gets meals on wheels and is able to feed himself and also eats mostly canned foods at home.  Pt reports that he wants to go to a nursing home in 2 years.  He states that he can't go until in 2 years because he has to finish paying his house off.  LCSW cannot pursue placement until APS has applied for guardianship and has control over finances to force payment to care for patient.  APS will continue to follow in the community. Case remains open.   LCSW will follow up with APS once called returned. All records sent to APS at time of discharge.  APS is aware of current situation and patient's inability to care for self.  Family involved, but not willing to accept responsibility for taking control of assets/decisions. 2:45 PM Spoke with Dr. Reynaldo Minium regarding case. Recommendations: If [facility] agency is not wiling to accept patient back for community services, then patient discharges without home health. Patient does not meet criteria for skilled home health need. Patient has a right to make bad decisions. Will defer case to APS, who is familiar with patient and will follow up in the community. RN CM was with LCSW at time of call and clarification of needs and next steps.  DC home.   Lane Hacker, MSW Clinical Social Work: [employer] Coverage for :  [phone]

## 2017-09-05 NOTE — ED Notes (Signed)
BLOOD SUGAR 97

## 2017-09-05 NOTE — ED Notes (Signed)
[facility] states that they need someone at the house when they bring pt.  Called him and he agrees to meet them there

## 2017-09-05 NOTE — ED Notes (Signed)
Brought pt a regular tray as he reports that he is hungry.  I cut up his food and set it in front of him.  He reports that he can feed himself.  He does not seem to be able to feed himself at this time. Pt reports that he gets meals on wheels and is able to feed himself and also eats mostly canned foods at home.  Pt reports that he wants to go to a nursing home in 2 years.  He states that he can't go until in 2 years because he has to finish paying his house off.

## 2017-09-05 NOTE — ED Notes (Signed)
Pt had soiled himself.  CLeaned him up and helped him back into his clothes and informed him of the [facility] discharge, pt realizes that he is responsible for going home and that we do not recommend it and do not feel that he is safe at home.  Pt tells me that he wants to be at home until his house is paid off and signs AMA form.  D/c paperwork given to EMS

## 2017-09-05 NOTE — Discharge Instructions (Signed)
You have left against medical advise. I advise that you stay you go to a nursing facility for better care. If you change your mind please return.   Follow-up instructions: Please follow-up with your primary care provider   Return instructions:  Please return to the Emergency Department if you do not get better, if you get worse, or new symptoms OR  - Fever (temperature greater than 101.39F)  - Bleeding that does not stop with holding pressure to the area    -Severe pain (please note that you may be more sore the day after your accident)  - Chest Pain  - Difficulty breathing  - Severe nausea or vomiting  - Inability to tolerate food and liquids  - Passing out  - Skin becoming red around your wounds  - Change in mental status (confusion or lethargy)  - New numbness or weakness    Please return if you have any other emergent concerns.  Additional Information:  Your vital signs today were: BP (!) 108/92 (BP Location: Right Arm)    Pulse 82    Temp 97.7 F (36.5 C) (Oral)    Resp 14    Ht 5\' 9"  (1.753 m)    Wt 83.9 kg (185 lb)    SpO2 97%    BMI 27.32 kg/m  If your blood pressure (BP) was elevated above 135/85 this visit, please have this repeated by your doctor within one month.

## 2017-09-05 NOTE — ED Notes (Signed)
Bed: WA01 Expected date:  Expected time:  Means of arrival:  Comments: 71 yo m strange home situation

## 2017-09-05 NOTE — ED Notes (Addendum)
Notified his social worker Printice Hellmer that he is going home

## 2017-09-05 NOTE — ED Notes (Signed)
I had left pt to finish his food.  He was not able to feed himself any of it.  When I returned I found him sucking on his pulse ox cable, he tells me "there is nothing in there".  It does not appear that he can feed himself.  Helped feed him the rest of his tray

## 2017-09-06 ENCOUNTER — Emergency Department (HOSPITAL_COMMUNITY): Payer: Medicare Other

## 2017-09-06 ENCOUNTER — Encounter (HOSPITAL_COMMUNITY): Payer: Self-pay | Admitting: Emergency Medicine

## 2017-09-06 ENCOUNTER — Inpatient Hospital Stay (HOSPITAL_COMMUNITY)
Admission: EM | Admit: 2017-09-06 | Discharge: 2017-09-17 | DRG: 871 | Disposition: A | Discharge status: Skilled Nursing Facility | Payer: Medicare Other | Attending: Internal Medicine | Admitting: Internal Medicine

## 2017-09-06 ENCOUNTER — Other Ambulatory Visit: Payer: Self-pay

## 2017-09-06 DIAGNOSIS — Z9189 Other specified personal risk factors, not elsewhere classified: Secondary | ICD-10-CM

## 2017-09-06 DIAGNOSIS — S0003XA Contusion of scalp, initial encounter: Secondary | ICD-10-CM | POA: Diagnosis present

## 2017-09-06 DIAGNOSIS — Z7982 Long term (current) use of aspirin: Secondary | ICD-10-CM

## 2017-09-06 DIAGNOSIS — F0280 Dementia in other diseases classified elsewhere without behavioral disturbance: Secondary | ICD-10-CM | POA: Diagnosis present

## 2017-09-06 DIAGNOSIS — W050XXA Fall from non-moving wheelchair, initial encounter: Secondary | ICD-10-CM | POA: Diagnosis present

## 2017-09-06 DIAGNOSIS — R296 Repeated falls: Secondary | ICD-10-CM

## 2017-09-06 DIAGNOSIS — E7220 Disorder of urea cycle metabolism, unspecified: Secondary | ICD-10-CM | POA: Diagnosis present

## 2017-09-06 DIAGNOSIS — G629 Polyneuropathy, unspecified: Secondary | ICD-10-CM | POA: Diagnosis present

## 2017-09-06 DIAGNOSIS — M4800 Spinal stenosis, site unspecified: Secondary | ICD-10-CM | POA: Diagnosis present

## 2017-09-06 DIAGNOSIS — Z87891 Personal history of nicotine dependence: Secondary | ICD-10-CM

## 2017-09-06 DIAGNOSIS — S0083XA Contusion of other part of head, initial encounter: Secondary | ICD-10-CM

## 2017-09-06 DIAGNOSIS — F419 Anxiety disorder, unspecified: Secondary | ICD-10-CM | POA: Diagnosis not present

## 2017-09-06 DIAGNOSIS — G9341 Metabolic encephalopathy: Secondary | ICD-10-CM | POA: Diagnosis present

## 2017-09-06 DIAGNOSIS — B9689 Other specified bacterial agents as the cause of diseases classified elsewhere: Secondary | ICD-10-CM | POA: Diagnosis present

## 2017-09-06 DIAGNOSIS — I69354 Hemiplegia and hemiparesis following cerebral infarction affecting left non-dominant side: Secondary | ICD-10-CM

## 2017-09-06 DIAGNOSIS — I10 Essential (primary) hypertension: Secondary | ICD-10-CM | POA: Diagnosis present

## 2017-09-06 DIAGNOSIS — D72829 Elevated white blood cell count, unspecified: Secondary | ICD-10-CM | POA: Diagnosis present

## 2017-09-06 DIAGNOSIS — R627 Adult failure to thrive: Secondary | ICD-10-CM | POA: Diagnosis present

## 2017-09-06 DIAGNOSIS — Z591 Inadequate housing: Secondary | ICD-10-CM

## 2017-09-06 DIAGNOSIS — E860 Dehydration: Secondary | ICD-10-CM | POA: Diagnosis present

## 2017-09-06 DIAGNOSIS — G3183 Dementia with Lewy bodies: Secondary | ICD-10-CM | POA: Diagnosis present

## 2017-09-06 DIAGNOSIS — R6251 Failure to thrive (child): Secondary | ICD-10-CM

## 2017-09-06 DIAGNOSIS — F0390 Unspecified dementia without behavioral disturbance: Secondary | ICD-10-CM | POA: Diagnosis present

## 2017-09-06 DIAGNOSIS — Z8546 Personal history of malignant neoplasm of prostate: Secondary | ICD-10-CM

## 2017-09-06 DIAGNOSIS — R7881 Bacteremia: Principal | ICD-10-CM | POA: Diagnosis present

## 2017-09-06 DIAGNOSIS — M6282 Rhabdomyolysis: Secondary | ICD-10-CM | POA: Diagnosis present

## 2017-09-06 DIAGNOSIS — R410 Disorientation, unspecified: Secondary | ICD-10-CM

## 2017-09-06 DIAGNOSIS — F05 Delirium due to known physiological condition: Secondary | ICD-10-CM | POA: Diagnosis present

## 2017-09-06 DIAGNOSIS — K219 Gastro-esophageal reflux disease without esophagitis: Secondary | ICD-10-CM | POA: Diagnosis present

## 2017-09-06 LAB — RAPID URINE DRUG SCREEN, HOSP PERFORMED
AMPHETAMINES: NOT DETECTED
BENZODIAZEPINES: NOT DETECTED
Barbiturates: NOT DETECTED
COCAINE: NOT DETECTED
OPIATES: NOT DETECTED
TETRAHYDROCANNABINOL: NOT DETECTED

## 2017-09-06 LAB — CBC WITH DIFFERENTIAL/PLATELET
BASOS ABS: 0 10*3/uL (ref 0.0–0.1)
Basophils Relative: 1 %
EOS ABS: 0.1 10*3/uL (ref 0.0–0.7)
EOS PCT: 2 %
HCT: 41.6 % (ref 39.0–52.0)
Hemoglobin: 13.4 g/dL (ref 13.0–17.0)
LYMPHS ABS: 1.6 10*3/uL (ref 0.7–4.0)
Lymphocytes Relative: 30 %
MCH: 28.2 pg (ref 26.0–34.0)
MCHC: 32.2 g/dL (ref 30.0–36.0)
MCV: 87.6 fL (ref 78.0–100.0)
MONO ABS: 0.5 10*3/uL (ref 0.1–1.0)
Monocytes Relative: 9 %
NEUTROS ABS: 3.3 10*3/uL (ref 1.7–7.7)
NEUTROS PCT: 60 %
Platelets: 311 10*3/uL (ref 150–400)
RBC: 4.75 MIL/uL (ref 4.22–5.81)
RDW: 14.6 % (ref 11.5–15.5)
WBC: 5.5 10*3/uL (ref 4.0–10.5)

## 2017-09-06 LAB — POC OCCULT BLOOD, ED: Fecal Occult Bld: NEGATIVE

## 2017-09-06 LAB — URINALYSIS, ROUTINE W REFLEX MICROSCOPIC
Bilirubin Urine: NEGATIVE
Glucose, UA: NEGATIVE mg/dL
Hgb urine dipstick: NEGATIVE
Ketones, ur: 20 mg/dL — AB
Leukocytes, UA: NEGATIVE
Nitrite: NEGATIVE
PH: 5 (ref 5.0–8.0)
Protein, ur: 30 mg/dL — AB
SPECIFIC GRAVITY, URINE: 1.03 (ref 1.005–1.030)

## 2017-09-06 LAB — COMPREHENSIVE METABOLIC PANEL
ALK PHOS: 46 U/L (ref 38–126)
ALT: 24 U/L (ref 17–63)
AST: 29 U/L (ref 15–41)
Albumin: 3.8 g/dL (ref 3.5–5.0)
Anion gap: 8 (ref 5–15)
BUN: 16 mg/dL (ref 6–20)
CALCIUM: 9.2 mg/dL (ref 8.9–10.3)
CHLORIDE: 105 mmol/L (ref 101–111)
CO2: 27 mmol/L (ref 22–32)
Creatinine, Ser: 0.9 mg/dL (ref 0.61–1.24)
GFR calc non Af Amer: >60 mL/min (ref >60)
Glucose, Bld: 97 mg/dL (ref 65–99)
Potassium: 4.4 mmol/L (ref 3.5–5.1)
SODIUM: 140 mmol/L (ref 135–145)
Total Bilirubin: 0.9 mg/dL (ref 0.3–1.2)
Total Protein: 7.2 g/dL (ref 6.5–8.1)

## 2017-09-06 LAB — I-STAT CG4 LACTIC ACID, ED: LACTIC ACID, VENOUS: 1.76 mmol/L (ref 0.5–1.9)

## 2017-09-06 LAB — I-STAT CHEM 8, ED
BUN: 24 mg/dL — AB (ref 6–20)
CHLORIDE: 104 mmol/L (ref 101–111)
Calcium, Ion: 1.15 mmol/L (ref 1.15–1.40)
Creatinine, Ser: 0.8 mg/dL (ref 0.61–1.24)
Glucose, Bld: 95 mg/dL (ref 65–99)
HEMATOCRIT: 42 % (ref 39.0–52.0)
Hemoglobin: 14.3 g/dL (ref 13.0–17.0)
POTASSIUM: 4.6 mmol/L (ref 3.5–5.1)
SODIUM: 142 mmol/L (ref 135–145)
TCO2: 31 mmol/L (ref 22–32)

## 2017-09-06 LAB — AMMONIA: Ammonia: 39 umol/L — ABNORMAL HIGH (ref 9–35)

## 2017-09-06 LAB — CK: Total CK: 404 U/L — ABNORMAL HIGH (ref 49–397)

## 2017-09-06 LAB — VALPROIC ACID LEVEL

## 2017-09-06 LAB — ETHANOL: Alcohol, Ethyl (B): <10 mg/dL (ref <10)

## 2017-09-06 MED ORDER — ASPIRIN 325 MG PO TABS
325.0000 mg | ORAL_TABLET | Freq: Every day | ORAL | Status: DC
  Administered 2017-09-07 – 2017-09-17 (×11): 325 mg via ORAL
  Filled 2017-09-06 (×11): qty 1

## 2017-09-06 MED ORDER — HYDROCHLOROTHIAZIDE 12.5 MG PO CAPS: 12.5000 mg | ORAL_CAPSULE | Freq: Every day | ORAL | Status: DC

## 2017-09-06 MED ORDER — QUETIAPINE FUMARATE 50 MG PO TABS
50.0000 mg | ORAL_TABLET | Freq: Every day | ORAL | Status: DC
  Filled 2017-09-06: qty 1

## 2017-09-06 MED ORDER — METOPROLOL TARTRATE 25 MG PO TABS
25.0000 mg | ORAL_TABLET | Freq: Two times a day (BID) | ORAL | Status: DC
  Administered 2017-09-07 – 2017-09-17 (×20): 25 mg via ORAL
  Filled 2017-09-06 (×22): qty 1

## 2017-09-06 MED ORDER — TRAMADOL HCL 50 MG PO TABS: 50.0000 mg | ORAL_TABLET | Freq: Three times a day (TID) | ORAL | Status: DC | PRN

## 2017-09-06 MED ORDER — POTASSIUM CHLORIDE CRYS ER 10 MEQ PO TBCR
10.0000 meq | EXTENDED_RELEASE_TABLET | Freq: Every day | ORAL | Status: DC
  Administered 2017-09-06: 10 meq via ORAL
  Filled 2017-09-06: qty 1

## 2017-09-06 MED ORDER — DIVALPROEX SODIUM 250 MG PO DR TAB
250.0000 mg | DELAYED_RELEASE_TABLET | Freq: Every day | ORAL | Status: DC
  Administered 2017-09-06 – 2017-09-16 (×11): 250 mg via ORAL
  Filled 2017-09-06 (×14): qty 1

## 2017-09-06 NOTE — ED Notes (Signed)
telesitter at bedside

## 2017-09-06 NOTE — ED Triage Notes (Signed)
Pt arrives via EMS from home with complaints of falling out of wheelchair. Pt reports hitting his head, but denies LOC. Pt also states he wants helping getting into a nursing home.

## 2017-09-06 NOTE — Progress Notes (Signed)
[facility] ED CSW covering [facility] received a call from pt's PA stating pt had returned after being D/C's on 10/25.  CSW consulted with CM familiar with the pt and reviewed chart.  Per notes and CM pt was at the ED yesterday (10/25) after being D/c'd from inpatient.    CSW will continue to follow.  Alphonse Guild. Charlotte Fidalgo, LCSW, LCAS, CSI Clinical Social Worker Ph: [phone]

## 2017-09-06 NOTE — ED Notes (Signed)
Pt roomed in hallway. RN and this tech took Pt to Pod C to clean feces that had been dried on buttock and legs. Pt placed in gown with warm blankets and placed back in hallway.

## 2017-09-06 NOTE — Care Management (Signed)
ED CM received call from [facility] ED CM concerning patient. She reports this patient was seen in the [facility] ED yesterday and discharge home with APS to follow up with case of obtaining guardianship. Patient lives alone and  is unable to care for self, but refused to be placed from hospital upon discharge.  Patient was apparently discharged home from inpatient on 10/23 with [place] services. [place] nurse went out yesterday after unable to reach patient by phone and called [employer] for safety check patient was on the floor at home covered with feces in squalid conditions. [place] nurse deemed patient was inappropriate for [facility] services patient needs increase level of care, ED CSW consulted, as per [facility] ED CM.  Reviewed  ED CSW note from yesterday APS involved with trying to obtained guardianship. Dr. Reynaldo Minium Medical Advisor was consulted by [facility] concerning patient (see CSW progress note 10/25 )  Spoke with  A. Harris PA-C on Pod A who states, patient appears to be confused answered questions inappropriately. This ED evaluation as per has not revealed any significant findings, ED CSW was updated. ED CSW states, he will follow up with [place] CSW concerning patient. No CM needs identified.

## 2017-09-06 NOTE — Discharge Instructions (Signed)
Contact a health care provider if: You are unable to feed yourself or hydrate yourself. You need help at home. You start to feel clumsy. You start to have tremors or weakness. Get help right away if: You have a seizure. You lose consciousness. You have trouble breathing. You do not feel able to care for yourself at home. You have a fever. You become disoriented at home.

## 2017-09-06 NOTE — Progress Notes (Signed)
CSW received a call from pt's ED CM who stated hospital will not admit the pt due to the pt being seen by hospitalist as a social work issue, unless Market researcher states otherwise.  Per CM HH has "signed off" and will not care for the pt due to [place] feeling that pt needing a higher level of care, per ED CM.  CSW reviewed notes and per CSW's note from 10/25:   "Patient found at home after welfare check. APS worker has been called and message left Zekiah Caruth [phone].  This Probation officer is very familiar with case (please see most recent discharge post 36 hours ago) and notes.  Barrier with placement: Patient is his own guardian and refusing to give up money to pay for placement. He was worked up for SNF, but has no payer source thus no bed offers or facilities would offer to him. APS is involved, did not feel the need to petition for guardianship, thus he was discharged him with APS to follow up along with home health. Pt reports that he gets meals on wheels and is able to feed himself and also eats mostly canned foods at home. Pt reports that he wants to go to a nursing home in 2 years. He states that he can't go until in 2 years because he has to finish paying his house off.  LCSW cannot pursue placement until APS has applied for guardianship and has control over finances to force payment to care for patient.  APS will continue to follow in the community. Case remains open.   LCSW will follow up with APS once called returned. All records sent to APS at time of discharge.  APS is aware of current situation and patient's inability to care for self.  Family involved, but not willing to accept responsibility for taking control of assets/decisions. 2:45 PM Spoke with Dr. Reynaldo Minium regarding case. Recommendations: If [facility] agency is not wiling to accept patient back for community services, then patient discharges without home health. Patient does not meet criteria  for skilled home health need. Patient has a right to make bad decisions. Will defer case to APS, who is familiar with patient and will follow up in the community. RN CM was with LCSW at time of call and clarification of needs and next steps.  DC home. "

## 2017-09-06 NOTE — Progress Notes (Addendum)
CSW spoke to EDP who stated there are no changes with the pt since admission to the ED today (10/26).  CSW attempted to contact pt's spouse but phone was disconnected.  CSW called other two relatives on facesheet (pt's son and daughter) and left HIPPA-compliant VM.  CSW staffed with CSW Asst Director and will Magazine features editor, per suggestion from [place] Asst Director.  CSW updated the CSW Asst Mudlogger and the Market researcher.  Medical Director stated unless there is a medical need to be treated and pt still has capacity it is the pt's choice to not pay for a SNF when pt has the money.  Medical Director is calling the EDP.  CSW will continue to follow.  10:19 PM CSW spoke with EDP and there has been no change medically and pt does not meet inpatient criteria at this time, thus pt is ready for D/C.  CSW will call family and leave a HIPPA-compliant VM stating pt is being D/C'd and CSW will suggest pt's family attempt to obtain POA.  CSW will continue to follow.  10:32 PM CSW left a VM with the pt's daughter [name] and suggested POA be obtaine dby family via VM.  CSW then called and spoke to pt's nephew Livingston Diones at [phone] and was told pt's dughter who did not answer the CSW's phone calls likely know the CSW is trying to call, but "doesn't want to be bothered with it" per pt's nephew.  Pt's nephew stated he understood and would pass along suggestion daughter obtain POA, that pt was being D/C'd and also stated he (the [name]) would arrive to the pt's house in the morning to check on the pt.  CSW updated EDP a relative would be by the pt's home in the morning to check on the pt's welfare.  Please reconsult if future social work needs arise.  CSW signing off, as social work intervention is no longer needed.  Alphonse Guild. Kacy Conely, LCSW, LCAS, CSI Clinical Social Worker Ph: [phone]

## 2017-09-06 NOTE — ED Notes (Signed)
Meal given, RN had to feed pt, he was attempting to eat his own fingers and unable to get food to mouth when place in his hand

## 2017-09-06 NOTE — ED Notes (Signed)
Lab at bedside collecting blood

## 2017-09-06 NOTE — ED Provider Notes (Signed)
[facility] EMERGENCY DEPARTMENT Provider Note   CSN: [id number] Arrival date & time: 09/06/17  1425     History   Chief Complaint Chief Complaint  Patient presents with  . Fall    HPI Evan Charles is a 71 y.o. male past medical history of  Lewy Body dementia with psychiatric overtones, history of stroke with left-sided hemiparesis, chronic mental illness, peripheral edema, history of urinary tract infections and rhabdomyolysis.  There is a level 5 caveat. According To EMS run sheet the patient called out for fall and asked to be placed in a nursing home.    According to the patient hes been falling recently at home and feeling off balance. He says that he is normally ambulatory and that his nephew lives with him and helps him.  The patient states he has been having black stool it is unable to hold his bowels to get to the bathroom.  According to review of the chart this does not appear to be the case as there was a recent well check by [employer] yesterday.   The patient was discharged from the hospital on 09/03/2017 after being admitted for rhabdomyolysis.  Yesterday he was found on the floor by police officers and has squalid living conditions.  Patient was brought in by EMS again today for fall.  I am on sure of how they were contacted.  The patient appears to be confused answering questions inappropriately.  He is also unkempt with black stool on his fingers.  HPI  Past Medical History:  Diagnosis Date  . Bilateral lower extremity edema 04/2017  . Chronic mental illness    Seroquel for intermittent hallucinations, psychiatry states he has capacity  . Colitis   . Dementia   . GERD (gastroesophageal reflux disease)   . Hypertension   . Neuropathy    " MY HANDS "  . Prostate cancer ([place])   . Rhabdomyolysis 09/08/2016  . Spinal stenosis    DDD , no neurogenic claudication  . UTI (urinary tract infection) 04/2017    Patient Active Problem List   Diagnosis Date  Noted  . Prediabetes 06/08/2017  . Stroke (cerebrum) ([place])   . 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 Dystonia 06/07/2017  . Weakness 06/06/2017  . Accelerated hypertension 06/06/2017  . Leg weakness 04/28/2017  . 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 Impaired mobility and ADLs   . Hypokalemia 11/05/2016  . Dementia with psychosis 09/12/2016  . Acute delirium 09/08/2016  . Normocytic anemia 09/08/2016  . Acute metabolic encephalopathy 93/71/6967  . Rhabdomyolysis 09/08/2016  . Fall at home   . Fall 07/25/2016  . Mild cognitive impairment 07/25/2016  . Immobility 03/20/2016  . Generalized weakness 03/20/2016  . Failure to thrive in adult 03/20/2016  . Neuropathy   . Essential hypertension   . Physical deconditioning   . 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 GERD without esophagitis 12/19/2015  . Bilateral lower extremity edema 12/19/2015  . Intractable low back pain 02/14/2015  . Difficulty walking 02/14/2015  . H/O: upper GI bleed   . Visual hallucinations   . Psychoses [facility])     Past Surgical History:  Procedure Laterality Date  . KNEE ARTHROSCOPY    . LAMINECTOMY    . PROSTATECTOMY         Home Medications    Prior to Admission medications   Medication Sig Start Date End Date Taking? Authorizing Provider  aspirin  325 MG tablet Take 1 tablet (325 mg total) by mouth daily. 06/12/17   Rama, Venetia Maxon, MD  divalproex (DEPAKOTE) 250 MG DR tablet Take 1 tablet (250 mg total) by mouth at bedtime. 07/16/17   Hendricks Limes, MD  docusate sodium (COLACE) 100 MG capsule Take 1 capsule (100 mg total) by mouth 2 (two) times daily as needed for moderate constipation. 04/30/17   Zada Finders, MD  hydrochlorothiazide (MICROZIDE) 12.5 MG capsule Take 1 capsule (12.5 mg total) by mouth daily. 07/16/17   Hendricks Limes, MD  metoprolol tartrate (LOPRESSOR) 25 MG tablet Take 1 tablet (25 mg total) by  mouth 2 (two) times daily. 07/16/17   Hendricks Limes, MD  polyethylene glycol [facility] Floria Raveling) packet Take 17 g by mouth daily as needed for mild constipation. 06/11/17   Rama, Venetia Maxon, MD  potassium chloride (K-DUR) 10 MEQ tablet Take 1 tablet (10 mEq total) by mouth daily. 08/29/17   Manuella Ghazi, Pratik D, DO  QUEtiapine (SEROQUEL) 50 MG tablet Take 1 tablet (50 mg total) by mouth at bedtime. 07/16/17   Hendricks Limes, MD  senna (SENOKOT) 8.6 MG TABS tablet Take 1 tablet (8.6 mg total) by mouth at bedtime. 06/11/17   Rama, Venetia Maxon, MD  traMADol (ULTRAM) 50 MG tablet Take 50 mg by mouth every 8 (eight) hours as needed (for pain).    [provider]    Family History Family History  Problem Relation Age of Onset  . Cancer Brother   . Cancer Maternal Grandmother     Social History Social History  Substance Use Topics  . Smoking status: Former Smoker    Types: Cigarettes  . Smokeless tobacco: Never Used  . Alcohol use No     Allergies   Patient has no known allergies.   Review of Systems Review of Systems  Unable to perform ROS: Mental status change     Physical Exam Updated Vital Signs BP 121/65 (BP Location: Right Arm)   Pulse 87   Temp 97.6 F (36.4 C) (Oral)   Resp 18   Ht 5\' 9"  (1.753 m)   Wt 83.9 kg (185 lb)   SpO2 98%   BMI 27.32 kg/m   Physical Exam  Constitutional: He appears well-developed and well-nourished. No distress.  HENT:  Head: Normocephalic and atraumatic.  Eyes: Pupils are equal, round, and reactive to light. Conjunctivae and EOM are normal. No scleral icterus.  Neck: Normal range of motion. Neck supple.  Cardiovascular: Normal rate, regular rhythm and normal heart sounds.   Bilateral lower extremity pitting edema worse on the right  Pulmonary/Chest: Effort normal and breath sounds normal. No respiratory distress.  Abdominal: Soft. He exhibits no distension. There is no tenderness. There is no guarding.  Neurological: He is  alert. No cranial nerve deficit.  Left-sided spastic hemiparesis   Skin: Skin is warm and dry. He is not diaphoretic.  Multiple bruises of the hands and arms Black stool on the hands bilaterally  Psychiatric: His behavior is normal.  Nursing note and vitals reviewed.    ED Treatments / Results  Labs (all labs ordered are listed, but only abnormal results are displayed) Labs Reviewed - No data to display  EKG  EKG Interpretation None       Radiology No results found.  Procedures Procedures (including critical care time)  Medications Ordered in ED Medications - No data to display   Initial Impression / Assessment and Plan / ED Course  I  have reviewed the triage vital signs and the nursing notes.  Pertinent labs & imaging results that were available during my care of the patient were reviewed by me and considered in my medical decision making (see chart for details).  Clinical Course as of Sep 06 2322  Fri Sep 06, 2017  1732 CK Total: (!) 404 [AH]  0867 I spoke with Dr. Shanon Brow over the phone for admission. She does not feel he meets admission criteria. And asks that I try case management and social work.  [AH]  2005 Pulse Rate: (!) 101 [AH]  2221 BUN: (!) 24 [AH]  2221 CK Total: (!) 404 [AH]    Clinical Course User Index [AH] Margarita Mail, PA-C    This patient has been under my care for 9 hours.  I have tried to advocate for this patient who is pitiful and unable to care for himself. At baseline he is confused. His case is extremely complicated.  I tried twice to admit the patient to Dr. Shanon Brow who declines admission based on the fact that he does not meet criteria for admission.   I spoke extensively with Hulan Amato (social work) and Building surveyor (case management) both of whom try to exhaust all of their possible resources.  The patient unfortunately has been dismissed by home health and does not qualify.  Although Dr. Abran Richard (psychiatry) wrote that he  does not appear to have the capacity to make medical decisions on his note from 08/30/2017, as far as I can tell Adult Protective Services has also refused to assume care.  Hulan Amato has spoken with the patient's family and his children do not want to assume care for him.  He does have a nephew who states that he will go by the house to check on him in the morning.  I have grave concern discharging this patient  given the patient's confusion and inability to make logical decisions or properly care for himself.  I did speak with our medical director Dr. Joya Salm extensively who is well informed of this patient's case.  He states the patient has the monetary funds to pay a bill he goes at the skilled nursing facility but has refused however he does appear to pay his rent and utilities.  He does not want to live his home.  I have concern for this patient and expressed my concern for my risks discharging the patient however I feel I have exhausted my ability to help this patient at this time.  I suspect he will return to the ER shortly as he is unable to feed himself, walk, or bathe himself.  This patient has been seen and shared visit with Dr. Reather Converse who feels the patient is high risk for discharge as well however we are unable to keep the patient at this time.   12:26 AM BP (!) 142/96   Pulse (!) 102   Temp 97.6 F (36.4 C) (Oral)   Resp 18   Ht 5\' 9"  (1.753 m)   Wt 83.9 kg (185 lb)   SpO2 99%   BMI 27.32 kg/m  At this time I tried to discharge the patient . Charge Nurse Mingo Amber has seen the patient and agrees that he should board and be reevaluated in the morning because he is unsafe for discharge. At this point I feel all further consults should be face to face. I have given sign out to Dr. Tyrone Nine . He will need AM Re-evaluation and face to  face consults with  CSW/ CM and with Psychiatrist for evaluation of capacity. Final Clinical Impressions(s) / ED Diagnoses   Final  diagnoses:  Hematoma of parietal scalp  Hematoma of occipital surface of head, initial encounter  Confusion  Dirty living conditions  Frequent falls  Requires supervision due to deficit in self-care  Failure to thrive (2-62)  Acute metabolic encephalopathy  Hyperammonemia [facility])    New Prescriptions New Prescriptions   No medications on file     Margarita Mail, PA-C 09/06/17 [place], [place], PA-C 09/07/17 0030    Elnora Morrison, MD 09/07/17 2332

## 2017-09-07 DIAGNOSIS — D72829 Elevated white blood cell count, unspecified: Secondary | ICD-10-CM | POA: Diagnosis present

## 2017-09-07 DIAGNOSIS — F0390 Unspecified dementia without behavioral disturbance: Secondary | ICD-10-CM | POA: Diagnosis present

## 2017-09-07 DIAGNOSIS — F0280 Dementia in other diseases classified elsewhere without behavioral disturbance: Secondary | ICD-10-CM | POA: Diagnosis present

## 2017-09-07 DIAGNOSIS — E7220 Disorder of urea cycle metabolism, unspecified: Secondary | ICD-10-CM | POA: Diagnosis present

## 2017-09-07 DIAGNOSIS — R296 Repeated falls: Secondary | ICD-10-CM | POA: Diagnosis present

## 2017-09-07 DIAGNOSIS — W050XXA Fall from non-moving wheelchair, initial encounter: Secondary | ICD-10-CM | POA: Diagnosis present

## 2017-09-07 DIAGNOSIS — I10 Essential (primary) hypertension: Secondary | ICD-10-CM | POA: Diagnosis not present

## 2017-09-07 DIAGNOSIS — G3183 Dementia with Lewy bodies: Secondary | ICD-10-CM | POA: Diagnosis not present

## 2017-09-07 DIAGNOSIS — M4800 Spinal stenosis, site unspecified: Secondary | ICD-10-CM | POA: Diagnosis present

## 2017-09-07 DIAGNOSIS — R7881 Bacteremia: Secondary | ICD-10-CM | POA: Diagnosis not present

## 2017-09-07 DIAGNOSIS — M6282 Rhabdomyolysis: Secondary | ICD-10-CM | POA: Diagnosis present

## 2017-09-07 DIAGNOSIS — K219 Gastro-esophageal reflux disease without esophagitis: Secondary | ICD-10-CM | POA: Diagnosis not present

## 2017-09-07 DIAGNOSIS — E860 Dehydration: Secondary | ICD-10-CM | POA: Diagnosis present

## 2017-09-07 DIAGNOSIS — R627 Adult failure to thrive: Secondary | ICD-10-CM | POA: Diagnosis present

## 2017-09-07 DIAGNOSIS — G9341 Metabolic encephalopathy: Secondary | ICD-10-CM | POA: Diagnosis present

## 2017-09-07 DIAGNOSIS — M6281 Muscle weakness (generalized): Secondary | ICD-10-CM | POA: Diagnosis not present

## 2017-09-07 DIAGNOSIS — B9689 Other specified bacterial agents as the cause of diseases classified elsewhere: Secondary | ICD-10-CM | POA: Diagnosis present

## 2017-09-07 DIAGNOSIS — Z87891 Personal history of nicotine dependence: Secondary | ICD-10-CM | POA: Diagnosis not present

## 2017-09-07 DIAGNOSIS — G3184 Mild cognitive impairment, so stated: Secondary | ICD-10-CM | POA: Diagnosis not present

## 2017-09-07 DIAGNOSIS — R262 Difficulty in walking, not elsewhere classified: Secondary | ICD-10-CM | POA: Diagnosis not present

## 2017-09-07 DIAGNOSIS — F05 Delirium due to known physiological condition: Secondary | ICD-10-CM | POA: Diagnosis present

## 2017-09-07 DIAGNOSIS — Z7982 Long term (current) use of aspirin: Secondary | ICD-10-CM | POA: Diagnosis not present

## 2017-09-07 DIAGNOSIS — F419 Anxiety disorder, unspecified: Secondary | ICD-10-CM | POA: Diagnosis not present

## 2017-09-07 DIAGNOSIS — I69354 Hemiplegia and hemiparesis following cerebral infarction affecting left non-dominant side: Secondary | ICD-10-CM | POA: Diagnosis not present

## 2017-09-07 DIAGNOSIS — G629 Polyneuropathy, unspecified: Secondary | ICD-10-CM | POA: Diagnosis present

## 2017-09-07 DIAGNOSIS — R410 Disorientation, unspecified: Secondary | ICD-10-CM | POA: Diagnosis not present

## 2017-09-07 DIAGNOSIS — Z8546 Personal history of malignant neoplasm of prostate: Secondary | ICD-10-CM | POA: Diagnosis not present

## 2017-09-07 DIAGNOSIS — S0003XA Contusion of scalp, initial encounter: Secondary | ICD-10-CM | POA: Diagnosis present

## 2017-09-07 DIAGNOSIS — M5440 Lumbago with sciatica, unspecified side: Secondary | ICD-10-CM | POA: Diagnosis not present

## 2017-09-07 DIAGNOSIS — B999 Unspecified infectious disease: Secondary | ICD-10-CM | POA: Diagnosis not present

## 2017-09-07 LAB — BLOOD CULTURE ID PANEL (REFLEXED)
ACINETOBACTER BAUMANNII: NOT DETECTED
CANDIDA ALBICANS: NOT DETECTED
CANDIDA TROPICALIS: NOT DETECTED
Candida glabrata: NOT DETECTED
Candida krusei: NOT DETECTED
Candida parapsilosis: NOT DETECTED
ENTEROBACTERIACEAE SPECIES: NOT DETECTED
ENTEROCOCCUS SPECIES: NOT DETECTED
Enterobacter cloacae complex: NOT DETECTED
Escherichia coli: NOT DETECTED
HAEMOPHILUS INFLUENZAE: NOT DETECTED
KLEBSIELLA PNEUMONIAE: NOT DETECTED
Klebsiella oxytoca: NOT DETECTED
Listeria monocytogenes: NOT DETECTED
NEISSERIA MENINGITIDIS: NOT DETECTED
Proteus species: NOT DETECTED
Pseudomonas aeruginosa: NOT DETECTED
STAPHYLOCOCCUS AUREUS BCID: NOT DETECTED
STAPHYLOCOCCUS SPECIES: NOT DETECTED
STREPTOCOCCUS AGALACTIAE: NOT DETECTED
STREPTOCOCCUS SPECIES: NOT DETECTED
Serratia marcescens: NOT DETECTED
Streptococcus pneumoniae: NOT DETECTED
Streptococcus pyogenes: NOT DETECTED

## 2017-09-07 MED ORDER — ONDANSETRON HCL 4 MG PO TABS: 4.0000 mg | ORAL_TABLET | Freq: Four times a day (QID) | ORAL | Status: DC | PRN

## 2017-09-07 MED ORDER — DEXTROSE 5 % IV SOLN
1.0000 g | Freq: Once | INTRAVENOUS | Status: AC
  Administered 2017-09-07: 1 g via INTRAVENOUS
  Filled 2017-09-07: qty 10

## 2017-09-07 MED ORDER — SODIUM CHLORIDE 0.9% FLUSH
3.0000 mL | Freq: Two times a day (BID) | INTRAVENOUS | Status: DC
  Administered 2017-09-08 – 2017-09-17 (×10): 3 mL via INTRAVENOUS

## 2017-09-07 MED ORDER — ONDANSETRON HCL 4 MG/2ML IJ SOLN: 4.0000 mg | Freq: Four times a day (QID) | INTRAMUSCULAR | Status: DC | PRN

## 2017-09-07 MED ORDER — ACETAMINOPHEN 325 MG PO TABS
650.0000 mg | ORAL_TABLET | Freq: Four times a day (QID) | ORAL | Status: DC | PRN
  Administered 2017-09-07 – 2017-09-14 (×7): 650 mg via ORAL
  Filled 2017-09-07 (×7): qty 2

## 2017-09-07 MED ORDER — SODIUM CHLORIDE 0.9 % IV SOLN
INTRAVENOUS | Status: DC
  Administered 2017-09-07 – 2017-09-08 (×3): via INTRAVENOUS

## 2017-09-07 MED ORDER — DEXTROSE 5 % IV SOLN
1.0000 g | INTRAVENOUS | Status: DC
  Administered 2017-09-07 – 2017-09-11 (×5): 1 g via INTRAVENOUS
  Filled 2017-09-07 (×5): qty 10

## 2017-09-07 MED ORDER — AMLODIPINE BESYLATE 10 MG PO TABS
10.0000 mg | ORAL_TABLET | Freq: Every day | ORAL | Status: DC
  Administered 2017-09-07 – 2017-09-17 (×9): 10 mg via ORAL
  Filled 2017-09-07: qty 1
  Filled 2017-09-07: qty 2
  Filled 2017-09-07 (×9): qty 1

## 2017-09-07 MED ORDER — QUETIAPINE FUMARATE 50 MG PO TABS
25.0000 mg | ORAL_TABLET | Freq: Every day | ORAL | Status: DC
  Administered 2017-09-07 – 2017-09-16 (×10): 25 mg via ORAL
  Filled 2017-09-07 (×10): qty 1

## 2017-09-07 MED ORDER — ACETAMINOPHEN 650 MG RE SUPP: 650.0000 mg | Freq: Four times a day (QID) | RECTAL | Status: DC | PRN

## 2017-09-07 MED ORDER — ENOXAPARIN SODIUM 40 MG/0.4ML ~~LOC~~ SOLN
40.0000 mg | Freq: Every day | SUBCUTANEOUS | Status: DC
  Administered 2017-09-07 – 2017-09-17 (×11): 40 mg via SUBCUTANEOUS
  Filled 2017-09-07 (×11): qty 0.4

## 2017-09-07 MED ORDER — KETOROLAC TROMETHAMINE 15 MG/ML IJ SOLN
15.0000 mg | Freq: Four times a day (QID) | INTRAMUSCULAR | Status: AC | PRN
  Administered 2017-09-07 – 2017-09-12 (×3): 15 mg via INTRAVENOUS
  Filled 2017-09-07 (×4): qty 1

## 2017-09-07 MED ORDER — PANTOPRAZOLE SODIUM 40 MG PO TBEC
40.0000 mg | DELAYED_RELEASE_TABLET | Freq: Every day | ORAL | Status: DC
  Administered 2017-09-07 – 2017-09-17 (×11): 40 mg via ORAL
  Filled 2017-09-07 (×11): qty 1

## 2017-09-07 NOTE — ED Notes (Signed)
Patient was given Catheryn Bacon.

## 2017-09-07 NOTE — ED Notes (Signed)
Patient now awake, this RN spoke with patient about why he was here, pt speaking incoherently, unable to make sense. Pt was able to tell this RN his name correctly but was not able to answer any other questions appropriately. Pt continued to ramble incoherently, despite direct questions asked by this RN. resp e/u, nad noted.

## 2017-09-07 NOTE — ED Notes (Signed)
Pt was brought over to pod for observation and to be watched over night. Pt has a camera/ tele. in room to watch pt and keep him safe.

## 2017-09-07 NOTE — H&P (Signed)
History and Physical    LEVEN HOEL ONG:[id number] DOB: 09-Jul-1946 DOA: 09/06/2017   PCP: [facility]   Attending physician: Denton Brick  Patient coming from/Resides with: Private residence/lives alone  Chief Complaint: Fall from wheelchair, abnormal blood culture  HPI: Evan Charles is a 71 y.o. male with medical history significant for dementia failure to thrive and physical deconditioning, history of prostate cancer, GERD, hypertension and spinal stenosis.  The patient was recently discharged on 10/24 after an admission for rhabdomyolysis with an initial CK level of 3599.  During that previous admission he was noted with altered mental status and was evaluated by psychiatry who determined that the patient did not have decision-making capacity. SNF was advised but apparently the patient refused to go to nursing facility because he wanted to go home and did not want to spend the money required to go to a nursing facility.  Unfortunately there is no guardianship in place and no family or POA available to enforce patient placement into nursing facility.  Sent home with home health services and Adult Protective Services following after discharge.  Returned back to the ER on 10/25 after [employer] went to perform a welfare check and found the patient on the floor, unclothed laying against his wheelchair and he had been incontinent of urine and/or stool.  The police officers subsequently dressed the patient and called EMS.  No acute issues were found in the ER and he did not meet requirements for admission.  Social work was consulted to assist with nursing facility placement but the patient once again refused and left the ER AMA.  He returned back to the ER on 10/26 after once again falling.  This time he was the one that called EMS to the home because after falling he hit his head; he denied loss of consciousness.  He also stated to the triage nurse he wanted help in getting into a nursing home.   While in the ER he was evaluated by social work and after further investigation it was documented that unless there was a medical need to be treated and patient still has capacity (according to previous discharge summary he does not have capacity) it is the patient's choice to not pay for skilled nursing facility when the patient has money.  At the time of the last social work evaluation on the evening of 10/26 patient did not have a medical need for admission i.e. inpatient criteria just were left with the patient's family to attempt to obtain POA status.  Additional contacts were made with the family including a nephew and messages were left with the daughter.  The nephew stated that daughter likely knows that SW is attempting to contact her but that she "does not want to be bothered with it".  Nephew stated he would notify daughter of request for her to obtain POA.  Plan was to check on the patient in the morning on 10/27.  Since those initial attempts at discharge disposition patient has had 1 of 2 blood cultures returned positive for GNR's although the [facility] ID demonstrated no organisms.  Patient remains confused and conflicted over whether he wants to return home or go to a skilled nursing facility.  He told me that "I just want to go somewhere nice to not somewhere they just want to send me".  As documented in previous discharge summary patient does not have capacity to make decisions and will either require family to obtain POA to assist with medical decision making  or state appointed guardianship will need to be pursued.  ED Course:  Vital Signs: BP (!) 144/107 (BP Location: Right Arm)   Pulse 79   Temp 98.1 F (36.7 C) (Oral)   Resp 18   Ht 5\' 9"  (1.753 m)   Wt 83.9 kg (185 lb)   SpO2 99%   BMI 27.32 kg/m  CT head without contrast: Minimal left posterior parietal scalp hematoma otherwise no acute abnormalities 2 view CXR: Neg Lab data: Sodium 140, potassium 4.4, chloride 105, CO2 27, glucose  97, BUN 16 with repeat BUN 24, creatinine 0.9, anion gap of 8, LFTs normal except for slightly elevated ammonia at 39, CK 404, lactic acid 1.76, white count 5500 with normal differential, hemoglobin 13.4, platelets 311,000, Depakote level <10, urinalysis abnormal with 20 ketones, 30 protein, few bacteria, FOB neg, alcohol <10, UDS negative, blood cultures 1/2+ for GNR's Medications and treatments: Spent 325 mg x1, Depakote 250 mg x1, K-Dur 10 milliequivalents x1, patient refused hydrochlorothiazide, Lopressor and Seroquel  Review of Systems:  **Unable to obtain accurate history due to patient's underlying altered mentation   Past Medical History:  Diagnosis Date  . Bilateral lower extremity edema 04/2017  . Chronic mental illness    Seroquel for intermittent hallucinations, psychiatry states he has capacity  . Colitis   . Dementia   . GERD (gastroesophageal reflux disease)   . Hypertension   . Neuropathy    " MY HANDS "  . Prostate cancer ([place])   . Rhabdomyolysis 09/08/2016  . Spinal stenosis    DDD , no neurogenic claudication  . UTI (urinary tract infection) 04/2017    Past Surgical History:  Procedure Laterality Date  . KNEE ARTHROSCOPY    . LAMINECTOMY    . PROSTATECTOMY      Social History   Social History  . Marital status: Single    Spouse name: N/A  . Number of children: N/A  . Years of education: N/A   Occupational History  . Retired    Social History Main Topics  . Smoking status: Former Smoker    Types: Cigarettes  . Smokeless tobacco: Never Used  . Alcohol use No  . Drug use: No  . Sexual activity: No   Other Topics Concern  . Not on file   Social History Narrative   Cares for self at home. Has friends to help and uses meals on wheels.     Mobility: Wheelchair; occasionally able to ambulate with walker Work history: Not obtained; patient did serve in the [employer] during the [place] War Era   No Known Allergies  Family History  Problem Relation  Age of Onset  . Cancer Brother   . Cancer Maternal Grandmother      Prior to Admission medications   Medication Sig Start Date End Date Taking? Authorizing Provider  divalproex (DEPAKOTE) 250 MG DR tablet Take 1 tablet (250 mg total) by mouth at bedtime. 07/16/17  Yes Hendricks Limes, MD  hydrochlorothiazide (MICROZIDE) 12.5 MG capsule Take 1 capsule (12.5 mg total) by mouth daily. 07/16/17  Yes Hendricks Limes, MD  metoprolol tartrate (LOPRESSOR) 25 MG tablet Take 1 tablet (25 mg total) by mouth 2 (two) times daily. 07/16/17  Yes Hendricks Limes, MD  potassium chloride (K-DUR) 10 MEQ tablet Take 1 tablet (10 mEq total) by mouth daily. 08/29/17  Yes Shah, Pratik D, DO  QUEtiapine (SEROQUEL) 50 MG tablet Take 1 tablet (50 mg total) by mouth at bedtime. 07/16/17  Yes  Hendricks Limes, MD  traMADol (ULTRAM) 50 MG tablet Take 50 mg by mouth every 8 (eight) hours as needed (for pain).   Yes [provider]  aspirin 325 MG tablet Take 1 tablet (325 mg total) by mouth daily. 06/12/17   Rama, Venetia Maxon, MD  docusate sodium (COLACE) 100 MG capsule Take 1 capsule (100 mg total) by mouth 2 (two) times daily as needed for moderate constipation. 04/30/17   Zada Finders, MD  polyethylene glycol (MIRALAX / Floria Raveling) packet Take 17 g by mouth daily as needed for mild constipation. 06/11/17   Rama, Venetia Maxon, MD  senna (SENOKOT) 8.6 MG TABS tablet Take 1 tablet (8.6 mg total) by mouth at bedtime. 06/11/17   Rama, Venetia Maxon, MD    Physical Exam: Vitals:   09/06/17 2230 09/06/17 2330 09/07/17 0000 09/07/17 0624  BP: (!) 141/86 (!) 138/96 (!) 142/96 (!) 144/107  Pulse:    79  Resp:      Temp:    98.1 F (36.7 C)  TempSrc:    Oral  SpO2:      Weight:      Height:          Constitutional: NAD, somewhat restless, comfortable Eyes: PERRL, lids and conjunctivae normal ENMT: Mucous membranes are dry. Posterior pharynx clear of any exudate or lesions. Neck: normal, supple, no masses, no  thyromegaly Respiratory: clear to auscultation bilaterally, no wheezing, no crackles. Normal respiratory effort. No accessory muscle use.  Cardiovascular: Regular rate and rhythm, no murmurs / rubs / gallops.  Mild, soft nonpitting bilateral lower extremity edema. 2+ pedal pulses. No carotid bruits.  Abdomen: no tenderness, no masses palpated. No hepatosplenomegaly. Bowel sounds positive.  Musculoskeletal: no clubbing / cyanosis. No joint deformity upper and lower extremities. Good ROM, no contractures. Normal muscle tone.  Skin: no rashes, lesions, ulcers. No induration Neurologic: CN 2-12 grossly intact. Sensation intact, DTR normal. Strength 5/5 x all 4 extremities.  Psychiatric: Alert and oriented x name only.  Mildly anxious, occasionally paranoid mood.  Patient has difficulty regarding use of appropriate words.  During conversation when asked regarding mobility with wheelchair patient called a wheelchair vacuum cleaner but then was able to state that sometimes he does better walking around the house with a rolling walker.  He has no insight into the severity of his current medical and social situation.  He is also conflicted stating to me he wanted to return home but when he initially presented to the ER stated he wanted to be placed.   Labs on Admission: I have personally reviewed following labs and imaging studies  CBC:  Recent Labs Lab 09/01/17 0441 09/06/17 1615 09/06/17 1631  WBC 4.5 5.5  --   NEUTROABS  --  3.3  --   HGB 13.4 13.4 14.3  HCT 40.9 41.6 42.0  MCV 87.4 87.6  --   PLT 261 311  --    Basic Metabolic Panel:  Recent Labs Lab 09/01/17 0441 09/02/17 0425 09/06/17 1615 09/06/17 1631  NA 138 139 140 142  K 4.3 4.2 4.4 4.6  CL 99* 101 105 104  CO2 30 30 27   --   GLUCOSE 97 104* 97 95  BUN 8 11 16  24*  CREATININE 0.83 0.73 0.90 0.80  CALCIUM 8.9 8.8* 9.2  --   MG 1.8  --   --   --    GFR: Estimated Creatinine Clearance: 84.7 mL/min (by C-G formula based on  SCr of 0.8 mg/dL). Liver Function  Tests:  Recent Labs Lab 09/06/17 1615  AST 29  ALT 24  ALKPHOS 46  BILITOT 0.9  PROT 7.2  ALBUMIN 3.8   No results for input(s): LIPASE, AMYLASE in the last 168 hours.  Recent Labs Lab 09/06/17 1615  AMMONIA 39*   Coagulation Profile: No results for input(s): INR, PROTIME in the last 168 hours. Cardiac Enzymes:  Recent Labs Lab 09/01/17 0441 09/06/17 1615  CKTOTAL 370 404*   BNP (last 3 results) No results for input(s): PROBNP in the last 8760 hours. HbA1C: No results for input(s): HGBA1C in the last 72 hours. CBG:  Recent Labs Lab 09/01/17 0735 09/02/17 0739 09/03/17 0734 09/05/17 1254  GLUCAP 114* 102* 95 97   Lipid Profile: No results for input(s): CHOL, HDL, LDLCALC, TRIG, CHOLHDL, LDLDIRECT in the last 72 hours. Thyroid Function Tests: No results for input(s): TSH, T4TOTAL, FREET4, T3FREE, THYROIDAB in the last 72 hours. Anemia Panel: No results for input(s): VITAMINB12, FOLATE, FERRITIN, TIBC, IRON, RETICCTPCT in the last 72 hours. Urine analysis:    Component Value Date/Time   COLORURINE YELLOW 09/06/2017 1620   APPEARANCEUR CLEAR 09/06/2017 1620   LABSPEC 1.030 09/06/2017 1620   PHURINE 5.0 09/06/2017 1620   GLUCOSEU NEGATIVE 09/06/2017 1620   HGBUR NEGATIVE 09/06/2017 1620   BILIRUBINUR NEGATIVE 09/06/2017 1620   KETONESUR 20 (A) 09/06/2017 1620   PROTEINUR 30 (A) 09/06/2017 1620   UROBILINOGEN 0.2 02/13/2015 1957   NITRITE NEGATIVE 09/06/2017 1620   LEUKOCYTESUR NEGATIVE 09/06/2017 1620   Sepsis Labs: @LABRCNTIP (procalcitonin:4,lacticidven:4) ) Recent Results (from the past 240 hour(s))  Blood Culture (routine x 2)     Status: None (Preliminary result)   Collection Time: 09/06/17  4:00 PM  Result Value Ref Range Status   Specimen Description BLOOD LEFT FOREARM  Final   Special Requests   Final    BOTTLES DRAWN AEROBIC AND ANAEROBIC Blood Culture adequate volume   Culture NO GROWTH < 24 HOURS  Final    Report Status PENDING  Incomplete  Blood Culture (routine x 2)     Status: None (Preliminary result)   Collection Time: 09/06/17  4:15 PM  Result Value Ref Range Status   Specimen Description BLOOD LEFT ANTECUBITAL  Final   Special Requests   Final    BOTTLES DRAWN AEROBIC AND ANAEROBIC Blood Culture adequate volume   Culture  Setup Time   Final    ANAEROBIC BOTTLE ONLY GRAM NEGATIVE RODS CRITICAL RESULT CALLED TO, READ BACK BY AND VERIFIED WITH: N GAZDA,PHARMD AT 0707 09/07/17 BY L BENFIELD    Culture GRAM NEGATIVE RODS  Final   Report Status PENDING  Incomplete  Blood Culture ID Panel (Reflexed)     Status: None   Collection Time: 09/06/17  4:15 PM  Result Value Ref Range Status   Enterococcus species NOT DETECTED NOT DETECTED Final   Listeria monocytogenes NOT DETECTED NOT DETECTED Final   Staphylococcus species NOT DETECTED NOT DETECTED Final   Staphylococcus aureus NOT DETECTED NOT DETECTED Final   Streptococcus species NOT DETECTED NOT DETECTED Final   Streptococcus agalactiae NOT DETECTED NOT DETECTED Final   Streptococcus pneumoniae NOT DETECTED NOT DETECTED Final   Streptococcus pyogenes NOT DETECTED NOT DETECTED Final   Acinetobacter baumannii NOT DETECTED NOT DETECTED Final   Enterobacteriaceae species NOT DETECTED NOT DETECTED Final   Enterobacter cloacae complex NOT DETECTED NOT DETECTED Final   Escherichia coli NOT DETECTED NOT DETECTED Final   Klebsiella oxytoca NOT DETECTED NOT DETECTED Final   Klebsiella pneumoniae  NOT DETECTED NOT DETECTED Final   Proteus species NOT DETECTED NOT DETECTED Final   Serratia marcescens NOT DETECTED NOT DETECTED Final   Haemophilus influenzae NOT DETECTED NOT DETECTED Final   Neisseria meningitidis NOT DETECTED NOT DETECTED Final   Pseudomonas aeruginosa NOT DETECTED NOT DETECTED Final   Candida albicans NOT DETECTED NOT DETECTED Final   Candida glabrata NOT DETECTED NOT DETECTED Final   Candida krusei NOT DETECTED NOT DETECTED  Final   Candida parapsilosis NOT DETECTED NOT DETECTED Final   Candida tropicalis NOT DETECTED NOT DETECTED Final     Radiological Exams on Admission: Dg Chest 2 View  Result Date: 09/06/2017 CLINICAL DATA:  Cough and confusion. EXAM: CHEST  2 VIEW COMPARISON:  05/31/2017 FINDINGS: The heart size and mediastinal contours are within normal limits. Stable mild elevation of the left hemidiaphragm. There is no evidence of pulmonary edema, consolidation, pneumothorax, nodule or pleural fluid. The visualized skeletal structures are unremarkable. IMPRESSION: No active cardiopulmonary disease. Electronically Signed   By: Aletta Edouard M.D.   On: 09/06/2017 17:08   Ct Head Wo Contrast  Result Date: 09/06/2017 CLINICAL DATA:  [address] out of a wheelchair and hit his head today. EXAM: CT HEAD WITHOUT CONTRAST TECHNIQUE: Contiguous axial images were obtained from the base of the skull through the vertex without intravenous contrast. COMPARISON:  08/27/2017. FINDINGS: Brain: Diffusely enlarged ventricles and subarachnoid spaces. Patchy white matter low density in both cerebral hemispheres. No intracranial hemorrhage, mass lesion or CT evidence of acute infarction. Vascular: No hyperdense vessel or unexpected calcification. Skull: Normal. Negative for fracture or focal lesion. Sinuses/Orbits: Small right maxillary sinus retention cysts. Small left anterior ethmoid sinus with retention cysts. Unremarkable orbits. Other: Minimal left posterior parietal scalp hematoma. IMPRESSION: 1. Minimal left posterior parietal scalp hematoma. 2. No skull fracture or intracranial hemorrhage. 3. Stable mild diffuse cerebral and cerebellar atrophy and minimal chronic small vessel white matter ischemic changes in both cerebral hemispheres. Electronically Signed   By: Claudie Revering M.D.   On: 09/06/2017 17:02    EKG: (Independently reviewed) sinus rhythm with ventricular rate 90 bpm, QTC 503 ms in the context of underlying right bundle  branch block, normal R wave rotation, no acute ischemic changes  Assessment/Plan Principal Problem:   Gram-negative bacteremia -Patient presents with recurrent fall out of wheelchair and altered mentation -Concerns of a possible infectious etiology prompted EDP to pursue detailed workup (including blood cultures) with 1/2+ for GNR's although the [facility] ID was negative for organisms -Repeat blood cultures -Urinalysis unremarkable but obtain urine culture for completeness of evaluation -History of prostate cancer and given patient's inability to correctly identify/remember physical symptoms will obtain bladder scan to see if patient retaining urine which could possibly contribute to above findings -Empiric Rocephin -No signs of sepsis  Active Problems:   Metabolic encephalopathy -Secondary to dehydration and possible infection in the context of underlying dementia -Treat underlying causes -CK and ammonia level slightly elevated -Neurological checks every 4 hours    Dehydration, moderate -Recent admission for rhabdomyolysis and markedly elevated CK -CK only mildly elevated -Would permanently discontinue hydrochlorothiazide and potassium supplementation for hypertension -IV fluids at 100/hr -Suspect patient's disc mobility and inability to safely care for himself at home including preparing food and drink also contributing -OVS -Stopped Ultram in favor of low-dose Toradol prn for pain with daily PPI    Dementia -Patient evaluated by psychiatry during previous admission and deemed to not have capacity to make decisions but unfortunately does not have established  POA for guardianship in place -Was discharged with home health as well as APS following -Patient has returned to the ER twice on successive days since discharged on 10/25-initial presentation was after [employer] was called to patient's home for well check and patient was found incapacitated on the floor -Patient does not appear safe to  return home regardless of his assertations that he desires to return to the home environment and that he does not want to pay for skilled facility services -If family unwilling to take on POA need to pursue establishing state appointed guardianship for the welfare of this patient -See below    Frequent falls/FTT (failure to thrive) in adult -Previously accepted into SNF but it was documented patient refused because he did not want to pay for skilled care thus was sent home with home health -Patient is a veteran-have asked SW to determine if he is eligible for LTAC services through the [place] -PT/OT -Mobilize with assistance    HTN (hypertension) -Currently not well controlled and patient refused antihypertensive medications earlier -We will discontinue thiazide diuretic and potassium given repeat presentations with dehydration and falls -Begin Norvasc 10 mg daily continue Lopressor    GERD (gastroesophageal reflux disease) -PPI -Remote history of upper [name] monitor closely while on Toradol       DVT prophylaxis: Lovenox Code Status: Full Family Communication: No family at bedside at time of admission Disposition Plan: To be determined Consults called: None    ELLIS,ALLISON L. ANP-BC [facility] Pager [phone]   If 7PM-7AM, please contact night-coverage [email or web address] Password [email or web address]  09/07/2017, 8:18 AM

## 2017-09-07 NOTE — Progress Notes (Signed)
CSW checked in with Jacqlyn Larsen RN to discuss social work needs for this patient, nurse stated that patient is being admitted and that there were no current needs to address.  CSW to follow up with additional needs once patient is settled upstairs.  Madilyn Fireman, MSW, LCSW-A Weekend Clinical Social Worker [phone]

## 2017-09-07 NOTE — ED Notes (Signed)
Breakfast tray ordered 

## 2017-09-07 NOTE — ED Notes (Signed)
Patient was given Sprite and Cookies.

## 2017-09-07 NOTE — ED Notes (Signed)
Orange juice given as requested.

## 2017-09-07 NOTE — ED Notes (Signed)
Pt aware attempted to call [name], nephew, unable to leave voicemail d/t not set up. Pt being transported via hospital bed w/Telesitter Monitor to 6N.ALL of pt's belongings - 2 belongings bags of pt's belongings w/pt.

## 2017-09-07 NOTE — ED Notes (Signed)
Patient has a tele monitor sitter at bedside.

## 2017-09-07 NOTE — Progress Notes (Signed)
PHARMACY - PHYSICIAN COMMUNICATION CRITICAL VALUE ALERT - BLOOD CULTURE IDENTIFICATION (BCID)  Results for orders placed or performed during the hospital encounter of 09/06/17  Blood Culture ID Panel (Reflexed) (Collected: 09/06/2017  4:15 PM)  Result Value Ref Range   Enterococcus species NOT DETECTED NOT DETECTED   Listeria monocytogenes NOT DETECTED NOT DETECTED   Staphylococcus species NOT DETECTED NOT DETECTED   Staphylococcus aureus NOT DETECTED NOT DETECTED   Streptococcus species NOT DETECTED NOT DETECTED   Streptococcus agalactiae NOT DETECTED NOT DETECTED   Streptococcus pneumoniae NOT DETECTED NOT DETECTED   Streptococcus pyogenes NOT DETECTED NOT DETECTED   Acinetobacter baumannii NOT DETECTED NOT DETECTED   Enterobacteriaceae species NOT DETECTED NOT DETECTED   Enterobacter cloacae complex NOT DETECTED NOT DETECTED   Escherichia coli NOT DETECTED NOT DETECTED   Klebsiella oxytoca NOT DETECTED NOT DETECTED   Klebsiella pneumoniae NOT DETECTED NOT DETECTED   Proteus species NOT DETECTED NOT DETECTED   Serratia marcescens NOT DETECTED NOT DETECTED   Haemophilus influenzae NOT DETECTED NOT DETECTED   Neisseria meningitidis NOT DETECTED NOT DETECTED   Pseudomonas aeruginosa NOT DETECTED NOT DETECTED   Candida albicans NOT DETECTED NOT DETECTED   Candida glabrata NOT DETECTED NOT DETECTED   Candida krusei NOT DETECTED NOT DETECTED   Candida parapsilosis NOT DETECTED NOT DETECTED   Candida tropicalis NOT DETECTED NOT DETECTED    Name of physician (or Provider) Contacted: Dr. Alvino Chapel  1/2 blood culture with gram neg rods. BCID shows no organisms detected. MD advised.   Angela Burke, PharmD, MS, BCPS Pharmacist 09/07/2017  7:22 AM

## 2017-09-07 NOTE — ED Notes (Signed)
Regular Diet has been ordered for Lunch. 

## 2017-09-07 NOTE — ED Notes (Signed)
Pt provided apple juice, made aware that breakfast had been ordered and should be here soon.

## 2017-09-07 NOTE — ED Notes (Signed)
Pt noted to be alert and oriented to self, place, and time. Pt noted to be disoriented at times. Pt able to feed self breakfast. Pt noted to be incont. States his nephew helps him and he receives Meals on  Wheels. States otherwise he lives by himself w/no help from his 3 daughters.

## 2017-09-07 NOTE — ED Notes (Signed)
Phlebotomist in w/pt.

## 2017-09-08 LAB — URINE CULTURE: Culture: NO GROWTH

## 2017-09-08 LAB — COMPREHENSIVE METABOLIC PANEL
ALK PHOS: 34 U/L — AB (ref 38–126)
ALT: 19 U/L (ref 17–63)
AST: 19 U/L (ref 15–41)
Albumin: 2.9 g/dL — ABNORMAL LOW (ref 3.5–5.0)
Anion gap: 8 (ref 5–15)
BILIRUBIN TOTAL: 0.6 mg/dL (ref 0.3–1.2)
BUN: 13 mg/dL (ref 6–20)
CO2: 25 mmol/L (ref 22–32)
CREATININE: 0.93 mg/dL (ref 0.61–1.24)
Calcium: 8.4 mg/dL — ABNORMAL LOW (ref 8.9–10.3)
Chloride: 104 mmol/L (ref 101–111)
GFR calc Af Amer: >60 mL/min (ref >60)
Glucose, Bld: 102 mg/dL — ABNORMAL HIGH (ref 65–99)
Potassium: 3.7 mmol/L (ref 3.5–5.1)
Sodium: 137 mmol/L (ref 135–145)
TOTAL PROTEIN: 5.3 g/dL — AB (ref 6.5–8.1)

## 2017-09-08 LAB — CBC
HCT: 35.5 % — ABNORMAL LOW (ref 39.0–52.0)
Hemoglobin: 11.7 g/dL — ABNORMAL LOW (ref 13.0–17.0)
MCH: 29.4 pg (ref 26.0–34.0)
MCHC: 33 g/dL (ref 30.0–36.0)
MCV: 89.2 fL (ref 78.0–100.0)
PLATELETS: 264 10*3/uL (ref 150–400)
RBC: 3.98 MIL/uL — ABNORMAL LOW (ref 4.22–5.81)
RDW: 14.9 % (ref 11.5–15.5)
WBC: 2.3 10*3/uL — AB (ref 4.0–10.5)

## 2017-09-08 NOTE — Evaluation (Signed)
Physical Therapy Evaluation Patient Details Name: Evan Charles MRN: [id number] DOB: [birth year] Today's Date: 09/08/2017   History of Present Illness  71 y.o. male with a history of spinal stenosis, seizures, visual hallucinations, and dementia. Pt discharged home 10/25 after admission s/p fall with shoulder pain and UTI. He was found to not have capacity to make his own decisions, yet refused SNF and returned home. Came to ED 10/26, 10/27 due to falls. Adm with bacteremia  Clinical Impression  Pt admitted with above diagnosis. Pt currently with functional limitations due to the deficits listed below (see PT Problem List). Patient very cooperative. Recalls recent falls and lying on the floor unable to get up. Agrees to work with PT and patient will benefit from skilled PT to increase their independence and safety with mobility to allow discharge to the venue listed below.       Follow Up Recommendations SNF (pt reports [facility] benefits )    Equipment Recommendations  None recommended by PT    Recommendations for Other Services OT consult     Precautions / Restrictions Precautions Precautions: Fall Restrictions Weight Bearing Restrictions: No      Mobility  Bed Mobility                  Transfers Overall transfer level: Needs assistance Equipment used: 1 person hand held assist   Sit to Stand: Mod assist         General transfer comment: no RW available and stood with PT assist; leans posteriorly due to tight heel cords and lack of DF; after ~60 seconds can attain full upright position with only minguard  Ambulation/Gait             General Gait Details: deferred due to lack of RW and poor standing balance.  Stairs            Wheelchair Mobility    Modified Rankin (Stroke Patients Only)       Balance   Sitting-balance support: No upper extremity supported;Feet supported Sitting balance-Leahy Scale: Good     Standing balance support: No upper  extremity supported Standing balance-Leahy Scale: Poor                   Standardized Balance Assessment Standardized Balance Assessment : Berg Balance Test Berg Balance Test Sit to Stand: Needs moderate or maximal assist to stand Standing Unsupported: Unable to stand 30 seconds unassisted Sitting with Back Unsupported but Feet Supported on Floor or Stool: Able to sit safely and securely 2 minutes Stand to Sit: Needs assistance to sit Transfers: Needs one person to assist Standing Unsupported with Eyes Closed: Needs help to keep from falling Standing Ubsupported with Feet Together: Needs help to attain position and unable to hold for 15 seconds From Standing, Reach Forward with Outstretched Arm: Loses balance while trying/requires external support From Standing Position, Pick up Object from Floor: Unable to try/needs assist to keep balance From Standing Position, Turn to Look Behind Over each Shoulder: Needs assist to keep from losing balance and falling Turn 360 Degrees: Needs assistance while turning Standing Unsupported, Alternately Place Feet on Step/Stool: Needs assistance to keep from falling or unable to try Standing Unsupported, One Foot in Front: Loses balance while stepping or standing Standing on One Leg: Unable to try or needs assist to prevent fall Total Score: 5         Pertinent Vitals/Pain Pain Assessment: No/denies pain    Home Living Family/patient expects to be discharged  to:: Private residence Living Arrangements: Alone Available Help at Discharge: Family;Friend(s);Available PRN/intermittently Type of Home: House Home Access: Stairs to enter Entrance Stairs-Rails: Psychiatric nurse of Steps: 6 Home Layout: One level Home Equipment: Walker - 2 wheels;Cane - single point;Bedside commode;Shower seat;Wheelchair - manual Additional Comments: information from previous admission    Prior Function Level of Independence: Needs assistance    Gait / Transfers Assistance Needed: RW for gait; wheelchair  ADL's / Homemaking Assistance Needed: nephew helps with errands and appts, food from Meals on Wheels  Comments: from prior admission and pt confirms     Hand Dominance   Dominant Hand: Right    Extremity/Trunk Assessment        Lower Extremity Assessment Lower Extremity Assessment: Generalized weakness;RLE deficits/detail RLE Deficits / Details: Knee flexion limited to ~70 degrees, full extension    Cervical / Trunk Assessment Cervical / Trunk Assessment: Normal  Communication   Communication: No difficulties  Cognition Arousal/Alertness: Awake/alert Behavior During Therapy: WFL for tasks assessed/performed Overall Cognitive Status: No family/caregiver present to determine baseline cognitive functioning                                 General Comments: able to follow one step commands; > one step unable      General Comments      Exercises     Assessment/Plan    PT Assessment Patient needs continued PT services  PT Problem List Decreased strength;Decreased range of motion;Decreased balance;Decreased mobility;Decreased cognition;Decreased knowledge of use of DME       PT Treatment Interventions DME instruction;Gait training;Functional mobility training;Therapeutic activities;Therapeutic exercise;Patient/family education;Balance training    PT Goals (Current goals can be found in the Care Plan section)  Acute Rehab PT Goals Patient Stated Goal: agrees to moving around without falling PT Goal Formulation: With patient Time For Goal Achievement: 09/22/17 Potential to Achieve Goals: Fair    Frequency Min 2X/week   Barriers to discharge Decreased caregiver support      Co-evaluation               AM-PAC PT "6 Clicks" Daily Activity  Outcome Measure Difficulty turning over in bed (including adjusting bedclothes, sheets and blankets)?: A Lot Difficulty moving from lying on  back to sitting on the side of the bed? : A Lot Difficulty sitting down on and standing up from a chair with arms (e.g., wheelchair, bedside commode, etc,.)?: A Lot Help needed moving to and from a bed to chair (including a wheelchair)?: A Lot Help needed walking in hospital room?: Total Help needed climbing 3-5 steps with a railing? : Total 6 Click Score: 10    End of Session Equipment Utilized During Treatment: Gait belt Activity Tolerance: Patient tolerated treatment well Patient left: in chair;with nursing/sitter in room (telesitter)   PT Visit Diagnosis: Unsteadiness on feet (R26.81);Repeated falls (R29.6);Muscle weakness (generalized) (M62.81);Difficulty in walking, not elsewhere classified (R26.2)    Time: 3790-2409 PT Time Calculation (min) (ACUTE ONLY): 22 min   Charges:   PT Evaluation $PT Eval Moderate Complexity: 1 Mod     PT G Codes:          [employer], PT 09/08/2017, 10:02 AM

## 2017-09-08 NOTE — Progress Notes (Signed)
PROGRESS NOTE    Evan Charles  WUJ:[id number] DOB: 1946/04/23 DOA: 09/06/2017 PCP: [facility]   Brief Narrative:   71 y.o. male with medical history significant for dementia failure to thrive and physical deconditioning, history of prostate cancer, GERD, hypertension and spinal stenosis.  The patient was recently discharged on 10/24 after an admission for rhabdomyolysis with an initial CK level of 3599.  During that previous admission he was noted with altered mental status and was evaluated by psychiatry who determined that the patient did not have decision-making capacity.   Pt presented after fall from wheelchair and had abnormal blood culture   Assessment & Plan:   Principal Problem:   Gram-negative bacteremia - repeated, question is whether last one was contaminant or not. - continue antibiotic regimen - f/u with cultures which remain negative.  Active Problems:   Dementia - pt on seroquel - prior reports of patient not having capacity, will need to establish someone who will make decisions on patient's behalf    GERD (gastroesophageal reflux disease) - stable on ppi    Frequent falls - place on fall precautions - Physical therapy    Dehydration, moderate - resolved after ehydration with IV fluids, will saline lock    FTT (failure to thrive) in adult   HTN (hypertension) - pt is on amlodipine, metoprolol  DVT prophylaxis: Lovenox Code Status: Full Family Communication: none at bedside. Disposition Plan: once we get blood culture results will plan on disposition recommendations.   Consultants:   none   Procedures: none   Antimicrobials: rocephin   Subjective: Pt has no new complaints, no acute issues overnight.  Objective: Vitals:   09/07/17 1418 09/07/17 2016 09/08/17 0545 09/08/17 1323  BP: 126/74 (!) 102/58 118/66 (!) 91/55  Pulse: 87 81 76 80  Resp: 20 (!) 21 19 18   Temp: 97.7 F (36.5 C) 97.8 F (36.6 C) 98.2 F (36.8 C)  98.4 F (36.9 C)  TempSrc: Oral Oral  Oral  SpO2: 100%  100% 100%  Weight:   80.5 kg (177 lb 8 oz)   Height:        Intake/Output Summary (Last 24 hours) at 09/08/17 1704 Last data filed at 09/08/17 1643  Gross per 24 hour  Intake          4378.67 ml  Output              975 ml  Net          3403.67 ml   Filed Weights   09/06/17 1427 09/08/17 0545  Weight: 83.9 kg (185 lb) 80.5 kg (177 lb 8 oz)    Examination:  General exam: Appears calm and comfortable, in nad. Respiratory system: Clear to auscultation. Respiratory effort normal. Equal chest rise Cardiovascular system: S1 & S2 heard, RRR. No JVD, murmurs, rubs, . Gastrointestinal system: Abdomen is nondistended, soft and nontender. No organomegaly or masses felt. Normal bowel sounds heard. Central nervous system: Alert and awake, no facial asymmetry Extremities: Symmetric 5 x 5 power. Skin: No rashes, lesions or ulcers, on limited exam. Psychiatry: pt pleasantly confused as such unable to assess well    Data Reviewed: I have personally reviewed following labs and imaging studies  CBC:  Recent Labs Lab 09/06/17 1615 09/06/17 1631 09/08/17 0233  WBC 5.5  --  2.3*  NEUTROABS 3.3  --   --   HGB 13.4 14.3 11.7*  HCT 41.6 42.0 35.5*  MCV 87.6  --  89.2  PLT 311  --  818   Basic Metabolic Panel:  Recent Labs Lab 09/02/17 0425 09/06/17 1615 09/06/17 1631 09/08/17 0233  NA 139 140 142 137  K 4.2 4.4 4.6 3.7  CL 101 105 104 104  CO2 30 27  --  25  GLUCOSE 104* 97 95 102*  BUN 11 16 24* 13  CREATININE 0.73 0.90 0.80 0.93  CALCIUM 8.8* 9.2  --  8.4*   GFR: Estimated Creatinine Clearance: 72.9 mL/min (by C-G formula based on SCr of 0.93 mg/dL). Liver Function Tests:  Recent Labs Lab 09/06/17 1615 09/08/17 0233  AST 29 19  ALT 24 19  ALKPHOS 46 34*  BILITOT 0.9 0.6  PROT 7.2 5.3*  ALBUMIN 3.8 2.9*   No results for input(s): LIPASE, AMYLASE in the last 168 hours.  Recent Labs Lab 09/06/17 1615    AMMONIA 39*   Coagulation Profile: No results for input(s): INR, PROTIME in the last 168 hours. Cardiac Enzymes:  Recent Labs Lab 09/06/17 1615  CKTOTAL 404*   BNP (last 3 results) No results for input(s): PROBNP in the last 8760 hours. HbA1C: No results for input(s): HGBA1C in the last 72 hours. CBG:  Recent Labs Lab 09/02/17 0739 09/03/17 0734 09/05/17 1254  GLUCAP 102* 95 97   Lipid Profile: No results for input(s): CHOL, HDL, LDLCALC, TRIG, CHOLHDL, LDLDIRECT in the last 72 hours. Thyroid Function Tests: No results for input(s): TSH, T4TOTAL, FREET4, T3FREE, THYROIDAB in the last 72 hours. Anemia Panel: No results for input(s): VITAMINB12, FOLATE, FERRITIN, TIBC, IRON, RETICCTPCT in the last 72 hours. Sepsis Labs:  Recent Labs Lab 09/06/17 1631  LATICACIDVEN 1.76    Recent Results (from the past 240 hour(s))  Blood Culture (routine x 2)     Status: None (Preliminary result)   Collection Time: 09/06/17  4:00 PM  Result Value Ref Range Status   Specimen Description BLOOD LEFT FOREARM  Final   Special Requests   Final    BOTTLES DRAWN AEROBIC AND ANAEROBIC Blood Culture adequate volume   Culture NO GROWTH 2 DAYS  Final   Report Status PENDING  Incomplete  Blood Culture (routine x 2)     Status: None (Preliminary result)   Collection Time: 09/06/17  4:15 PM  Result Value Ref Range Status   Specimen Description BLOOD LEFT ANTECUBITAL  Final   Special Requests   Final    BOTTLES DRAWN AEROBIC AND ANAEROBIC Blood Culture adequate volume   Culture  Setup Time   Final    ANAEROBIC BOTTLE ONLY GRAM NEGATIVE RODS CRITICAL RESULT CALLED TO, READ BACK BY AND VERIFIED WITH: N GAZDA,PHARMD AT 0707 09/07/17 BY L BENFIELD    Culture GRAM NEGATIVE RODS  Final   Report Status PENDING  Incomplete  Blood Culture ID Panel (Reflexed)     Status: None   Collection Time: 09/06/17  4:15 PM  Result Value Ref Range Status   Enterococcus species NOT DETECTED NOT DETECTED Final    Listeria monocytogenes NOT DETECTED NOT DETECTED Final   Staphylococcus species NOT DETECTED NOT DETECTED Final   Staphylococcus aureus NOT DETECTED NOT DETECTED Final   Streptococcus species NOT DETECTED NOT DETECTED Final   Streptococcus agalactiae NOT DETECTED NOT DETECTED Final   Streptococcus pneumoniae NOT DETECTED NOT DETECTED Final   Streptococcus pyogenes NOT DETECTED NOT DETECTED Final   Acinetobacter baumannii NOT DETECTED NOT DETECTED Final   Enterobacteriaceae species NOT DETECTED NOT DETECTED Final   Enterobacter cloacae complex NOT DETECTED NOT DETECTED Final   Escherichia coli  NOT DETECTED NOT DETECTED Final   Klebsiella oxytoca NOT DETECTED NOT DETECTED Final   Klebsiella pneumoniae NOT DETECTED NOT DETECTED Final   Proteus species NOT DETECTED NOT DETECTED Final   Serratia marcescens NOT DETECTED NOT DETECTED Final   Haemophilus influenzae NOT DETECTED NOT DETECTED Final   Neisseria meningitidis NOT DETECTED NOT DETECTED Final   Pseudomonas aeruginosa NOT DETECTED NOT DETECTED Final   Candida albicans NOT DETECTED NOT DETECTED Final   Candida glabrata NOT DETECTED NOT DETECTED Final   Candida krusei NOT DETECTED NOT DETECTED Final   Candida parapsilosis NOT DETECTED NOT DETECTED Final   Candida tropicalis NOT DETECTED NOT DETECTED Final  Culture, Urine     Status: None   Collection Time: 09/07/17  8:58 AM  Result Value Ref Range Status   Specimen Description URINE, RANDOM  Final   Special Requests NONE  Final   Culture NO GROWTH  Final   Report Status 09/08/2017 FINAL  Final  Culture, blood (Routine X 2) w Reflex to ID Panel     Status: None (Preliminary result)   Collection Time: 09/07/17  9:40 AM  Result Value Ref Range Status   Specimen Description BLOOD RIGHT HAND  Final   Special Requests   Final    BOTTLES DRAWN AEROBIC AND ANAEROBIC Blood Culture results may not be optimal due to an excessive volume of blood received in culture bottles   Culture NO  GROWTH 1 DAY  Final   Report Status PENDING  Incomplete  Culture, blood (Routine X 2) w Reflex to ID Panel     Status: None (Preliminary result)   Collection Time: 09/07/17  9:50 AM  Result Value Ref Range Status   Specimen Description BLOOD LEFT HAND  Final   Special Requests   Final    BOTTLES DRAWN AEROBIC AND ANAEROBIC Blood Culture results may not be optimal due to an excessive volume of blood received in culture bottles   Culture NO GROWTH 1 DAY  Final   Report Status PENDING  Incomplete         Radiology Studies: Dg Chest 2 View  Result Date: 09/06/2017 CLINICAL DATA:  Cough and confusion. EXAM: CHEST  2 VIEW COMPARISON:  05/31/2017 FINDINGS: The heart size and mediastinal contours are within normal limits. Stable mild elevation of the left hemidiaphragm. There is no evidence of pulmonary edema, consolidation, pneumothorax, nodule or pleural fluid. The visualized skeletal structures are unremarkable. IMPRESSION: No active cardiopulmonary disease. Electronically Signed   By: Aletta Edouard M.D.   On: 09/06/2017 17:08        Scheduled Meds: . amLODipine  10 mg Oral Daily  . aspirin  325 mg Oral Daily  . divalproex  250 mg Oral QHS  . enoxaparin (LOVENOX) injection  40 mg Subcutaneous Daily  . metoprolol tartrate  25 mg Oral BID  . pantoprazole  40 mg Oral Daily  . QUEtiapine  25 mg Oral QHS  . sodium chloride flush  3 mL Intravenous Q12H   Continuous Infusions: . sodium chloride 100 mL/hr at 09/08/17 1613  . cefTRIAXone (ROCEPHIN)  IV Stopped (09/08/17 0013)     LOS: 1 day    Time spent: > 35 minutes    Velvet Bathe, MD [facility] Pager [phone]  If 7PM-7AM, please contact night-coverage [email or web address] Password [email or web address] 09/08/2017, 5:04 PM

## 2017-09-09 NOTE — Evaluation (Signed)
Occupational Therapy Evaluation Patient Details Name: Evan Charles MRN: [id number] DOB: May 07, 1946 Today's Date: 09/09/2017    History of Present Illness 71 y.o. male with a history of spinal stenosis, seizures, visual hallucinations, and dementia. Pt discharged home 10/25 after admission s/p fall with shoulder pain and UTI. He was found to not have capacity to make his own decisions, yet refused SNF and returned home. Came to ED 10/26, 10/27 due to falls. Adm with bacteremia   Clinical Impression   This 71 yo male admitted with above presents to acute OT with decreased balance and cognitive deficits affecting his ability to be safe and manage for himself at home without 24 hour S/prn A. He will benefit from acute OT with follow up OT at SNF.    Follow Up Recommendations  SNF;Supervision/Assistance - 24 hour    Equipment Recommendations  None recommended by OT       Precautions / Restrictions Precautions Precautions: Fall Restrictions Weight Bearing Restrictions: No Other Position/Activity Restrictions: WBAT      Mobility Bed Mobility Overal bed mobility: Needs Assistance Bed Mobility: Supine to Sit     Supine to sit: Supervision (HOB up and use of rail)        Transfers Overall transfer level: Needs assistance Equipment used: Rolling walker (2 wheeled) Transfers: Sit to/from Stand           General transfer comment: from bed Mod A with rocking motion with one hand on RW and hand on bed--tendency for posterior push; from recliner Min A with pushing up with two arm rests    Balance Overall balance assessment: Needs assistance Sitting-balance support: No upper extremity supported;Feet supported Sitting balance-Leahy Scale: Good     Standing balance support: Bilateral upper extremity supported Standing balance-Leahy Scale: Poor Standing balance comment: reliant on RW                           ADL either performed or assessed with clinical  judgement   ADL Overall ADL's : Needs assistance/impaired Eating/Feeding: Set up;Sitting   Grooming: Set up;Sitting;Supervision/safety   Upper Body Bathing: Set up;Supervision/ safety   Lower Body Bathing: Minimal assistance;Sit to/from stand   Upper Body Dressing : Set up;Supervision/safety;Sitting   Lower Body Dressing: Minimal assistance;Sit to/from stand   Toilet Transfer: Minimal assistance;Ambulation;RW Toilet Transfer Details (indicate cue type and reason): bed>around to recliner Toileting- Clothing Manipulation and Hygiene: Minimal assistance;Sit to/from stand               Vision Patient Visual Report: No change from baseline              Pertinent Vitals/Pain Pain Assessment: No/denies pain     Hand Dominance Right   Extremity/Trunk Assessment Upper Extremity Assessment Upper Extremity Assessment: Overall WFL for tasks assessed           Communication Communication Communication: No difficulties   Cognition Arousal/Alertness: Awake/alert Behavior During Therapy: WFL for tasks assessed/performed Overall Cognitive Status: History of cognitive impairments - at baseline (h/o dementia)                                                Home Living Family/patient expects to be discharged to:: Skilled nursing facility Living Arrangements: Alone Available Help at Discharge: Family;Friend(s);Available PRN/intermittently (reports nephew checks on him at least once  a day) Type of Home: House Home Access: Stairs to enter [employer] of Steps: 6 Entrance Stairs-Rails: Right;Left Home Layout: One level     Bathroom Shower/Tub: Walk-in shower;Tub/shower unit   Bathroom Toilet: Standard     Home Equipment: Environmental consultant - 2 wheels;Cane - single point;Bedside commode;Shower seat;Wheelchair - manual          Prior Functioning/Environment Level of Independence: Needs assistance  Gait / Transfers Assistance Needed: RW for gait;  wheelchair ADL's / Homemaking Assistance Needed: nephew helps with errands and appts, food from Meals on Wheels            OT Problem List: Impaired balance (sitting and/or standing);Decreased cognition;Decreased safety awareness      OT Treatment/Interventions: Self-care/ADL training;DME and/or AE instruction;Patient/family education;Balance training    OT Goals(Current goals can be found in the care plan section) Acute Rehab OT Goals Patient Stated Goal: I think rehab short term would be good--then maybe assisted living, I have been thinking about it OT Goal Formulation: With patient Time For Goal Achievement: 09/23/17 Potential to Achieve Goals: Good  OT Frequency: Min 2X/week   Barriers to D/C: Decreased caregiver support             AM-PAC PT "6 Clicks" Daily Activity     Outcome Measure Help from another person eating meals?: A Little Help from another person taking care of personal grooming?: A Little Help from another person toileting, which includes using toliet, bedpan, or urinal?: A Little Help from another person bathing (including washing, rinsing, drying)?: A Little Help from another person to put on and taking off regular upper body clothing?: A Little Help from another person to put on and taking off regular lower body clothing?: A Little 6 Click Score: 18   End of Session Equipment Utilized During Treatment: Gait belt;Rolling walker Nurse Communication: Mobility status (NT)  Activity Tolerance: Patient tolerated treatment well Patient left: in chair;with call bell/phone within reach;with chair alarm set  OT Visit Diagnosis: Unsteadiness on feet (R26.81)                Time: 2585-2778 OT Time Calculation (min): 36 min Charges:  OT General Charges $OT Visit: 1 Visit OT Evaluation $OT Eval Moderate Complexity: 1 Mod OT Treatments $Self Care/Home Management : 8-22 mins [place], OTR/L [phone] 09/09/2017

## 2017-09-09 NOTE — Progress Notes (Signed)
PROGRESS NOTE    Evan Charles  VEL:[id number] DOB: [birth year] DOA: 09/06/2017 PCP: [facility]   Brief Narrative:   72 y.o. male with medical history significant for dementia failure to thrive and physical deconditioning, history of prostate cancer, GERD, hypertension and spinal stenosis.  The patient was recently discharged on 10/24 after an admission for rhabdomyolysis with an initial CK level of 3599.  During that previous admission he was noted with altered mental status and was evaluated by psychiatry who determined that the patient did not have decision-making capacity.   Pt presented after fall from wheelchair and had abnormal blood culture   Assessment & Plan:   Principal Problem:   Gram-negative bacteremia - repeated, question is whether last one was contaminant or not. - continue antibiotic regimen - Cultures remain negative.  Active Problems:   Dementia - pt on seroquel - prior reports of patient not having capacity, will need to establish someone who will make decisions on patient's behalf. Social worker consulted.    GERD (gastroesophageal reflux disease) - stable on ppi    Frequent falls - place on fall precautions - Physical therapy    Dehydration, moderate - resolved after ehydration with IV fluids, will saline lock    FTT (failure to thrive) in adult   HTN (hypertension) - pt is on amlodipine, metoprolol  DVT prophylaxis: Lovenox Code Status: Full Family Communication: none at bedside. Disposition Plan: once we get blood culture results will plan on disposition recommendations. Pt nearing discharge as cultures remain negative.   Consultants:   none   Procedures: none   Antimicrobials: rocephin   Subjective: Pt has no new complaints, no acute issues overnight.  Objective: Vitals:   09/08/17 2010 09/09/17 0503 09/09/17 1009 09/09/17 1352  BP: 109/70 112/68 (!) 110/55 120/81  Pulse: 88 82 84 79  Resp: 18 17  16   Temp: (!)  97.5 F (36.4 C) 98 F (36.7 C)  97.9 F (36.6 C)  TempSrc: Oral   Oral  SpO2: 100% 99%  100%  Weight:  76.3 kg (168 lb 3.4 oz)    Height:        Intake/Output Summary (Last 24 hours) at 09/09/17 1602 Last data filed at 09/09/17 1554  Gross per 24 hour  Intake          2626.67 ml  Output             1126 ml  Net          1500.67 ml   Filed Weights   09/06/17 1427 09/08/17 0545 09/09/17 0503  Weight: 83.9 kg (185 lb) 80.5 kg (177 lb 8 oz) 76.3 kg (168 lb 3.4 oz)    Examination: Exam unchanged when compared to 09/08/2017  General exam: Appears calm and comfortable, in nad. Respiratory system: Clear to auscultation. Respiratory effort normal. Equal chest rise Cardiovascular system: S1 & S2 heard, RRR. No JVD, murmurs, rubs, . Gastrointestinal system: Abdomen is nondistended, soft and nontender. No organomegaly or masses felt. Normal bowel sounds heard. Central nervous system: Alert and awake, no facial asymmetry Extremities: Symmetric 5 x 5 power. Skin: No rashes, lesions or ulcers, on limited exam. Psychiatry: pt pleasantly confused as such unable to assess well    Data Reviewed: I have personally reviewed following labs and imaging studies  CBC:  Recent Labs Lab 09/06/17 1615 09/06/17 1631 09/08/17 0233  WBC 5.5  --  2.3*  NEUTROABS 3.3  --   --   HGB 13.4 14.3  11.7*  HCT 41.6 42.0 35.5*  MCV 87.6  --  89.2  PLT 311  --  614   Basic Metabolic Panel:  Recent Labs Lab 09/06/17 1615 09/06/17 1631 09/08/17 0233  NA 140 142 137  K 4.4 4.6 3.7  CL 105 104 104  CO2 27  --  25  GLUCOSE 97 95 102*  BUN 16 24* 13  CREATININE 0.90 0.80 0.93  CALCIUM 9.2  --  8.4*   GFR: Estimated Creatinine Clearance: 72.9 mL/min (by C-G formula based on SCr of 0.93 mg/dL). Liver Function Tests:  Recent Labs Lab 09/06/17 1615 09/08/17 0233  AST 29 19  ALT 24 19  ALKPHOS 46 34*  BILITOT 0.9 0.6  PROT 7.2 5.3*  ALBUMIN 3.8 2.9*   No results for input(s): LIPASE,  AMYLASE in the last 168 hours.  Recent Labs Lab 09/06/17 1615  AMMONIA 39*   Coagulation Profile: No results for input(s): INR, PROTIME in the last 168 hours. Cardiac Enzymes:  Recent Labs Lab 09/06/17 1615  CKTOTAL 404*   BNP (last 3 results) No results for input(s): PROBNP in the last 8760 hours. HbA1C: No results for input(s): HGBA1C in the last 72 hours. CBG:  Recent Labs Lab 09/03/17 0734 09/05/17 1254  GLUCAP 95 97   Lipid Profile: No results for input(s): CHOL, HDL, LDLCALC, TRIG, CHOLHDL, LDLDIRECT in the last 72 hours. Thyroid Function Tests: No results for input(s): TSH, T4TOTAL, FREET4, T3FREE, THYROIDAB in the last 72 hours. Anemia Panel: No results for input(s): VITAMINB12, FOLATE, FERRITIN, TIBC, IRON, RETICCTPCT in the last 72 hours. Sepsis Labs:  Recent Labs Lab 09/06/17 1631  LATICACIDVEN 1.76    Recent Results (from the past 240 hour(s))  Blood Culture (routine x 2)     Status: None (Preliminary result)   Collection Time: 09/06/17  4:00 PM  Result Value Ref Range Status   Specimen Description BLOOD LEFT FOREARM  Final   Special Requests   Final    BOTTLES DRAWN AEROBIC AND ANAEROBIC Blood Culture adequate volume   Culture NO GROWTH 3 DAYS  Final   Report Status PENDING  Incomplete  Blood Culture (routine x 2)     Status: None (Preliminary result)   Collection Time: 09/06/17  4:15 PM  Result Value Ref Range Status   Specimen Description BLOOD LEFT ANTECUBITAL  Final   Special Requests   Final    BOTTLES DRAWN AEROBIC AND ANAEROBIC Blood Culture adequate volume   Culture  Setup Time   Final    ANAEROBIC BOTTLE ONLY GRAM NEGATIVE RODS CRITICAL RESULT CALLED TO, READ BACK BY AND VERIFIED WITH: N GAZDA,PHARMD AT 0707 09/07/17 BY L BENFIELD    Culture GRAM NEGATIVE RODS  Final   Report Status PENDING  Incomplete  Blood Culture ID Panel (Reflexed)     Status: None   Collection Time: 09/06/17  4:15 PM  Result Value Ref Range Status    Enterococcus species NOT DETECTED NOT DETECTED Final   Listeria monocytogenes NOT DETECTED NOT DETECTED Final   Staphylococcus species NOT DETECTED NOT DETECTED Final   Staphylococcus aureus NOT DETECTED NOT DETECTED Final   Streptococcus species NOT DETECTED NOT DETECTED Final   Streptococcus agalactiae NOT DETECTED NOT DETECTED Final   Streptococcus pneumoniae NOT DETECTED NOT DETECTED Final   Streptococcus pyogenes NOT DETECTED NOT DETECTED Final   Acinetobacter baumannii NOT DETECTED NOT DETECTED Final   Enterobacteriaceae species NOT DETECTED NOT DETECTED Final   Enterobacter cloacae complex NOT DETECTED NOT DETECTED  Final   Escherichia coli NOT DETECTED NOT DETECTED Final   Klebsiella oxytoca NOT DETECTED NOT DETECTED Final   Klebsiella pneumoniae NOT DETECTED NOT DETECTED Final   Proteus species NOT DETECTED NOT DETECTED Final   Serratia marcescens NOT DETECTED NOT DETECTED Final   Haemophilus influenzae NOT DETECTED NOT DETECTED Final   Neisseria meningitidis NOT DETECTED NOT DETECTED Final   Pseudomonas aeruginosa NOT DETECTED NOT DETECTED Final   Candida albicans NOT DETECTED NOT DETECTED Final   Candida glabrata NOT DETECTED NOT DETECTED Final   Candida krusei NOT DETECTED NOT DETECTED Final   Candida parapsilosis NOT DETECTED NOT DETECTED Final   Candida tropicalis NOT DETECTED NOT DETECTED Final  Culture, Urine     Status: None   Collection Time: 09/07/17  8:58 AM  Result Value Ref Range Status   Specimen Description URINE, RANDOM  Final   Special Requests NONE  Final   Culture NO GROWTH  Final   Report Status 09/08/2017 FINAL  Final  Culture, blood (Routine X 2) w Reflex to ID Panel     Status: None (Preliminary result)   Collection Time: 09/07/17  9:40 AM  Result Value Ref Range Status   Specimen Description BLOOD RIGHT HAND  Final   Special Requests   Final    BOTTLES DRAWN AEROBIC AND ANAEROBIC Blood Culture results may not be optimal due to an excessive volume of  blood received in culture bottles   Culture NO GROWTH 2 DAYS  Final   Report Status PENDING  Incomplete  Culture, blood (Routine X 2) w Reflex to ID Panel     Status: None (Preliminary result)   Collection Time: 09/07/17  9:50 AM  Result Value Ref Range Status   Specimen Description BLOOD LEFT HAND  Final   Special Requests   Final    BOTTLES DRAWN AEROBIC AND ANAEROBIC Blood Culture results may not be optimal due to an excessive volume of blood received in culture bottles   Culture NO GROWTH 2 DAYS  Final   Report Status PENDING  Incomplete         Radiology Studies: No results found.      Scheduled Meds: . amLODipine  10 mg Oral Daily  . aspirin  325 mg Oral Daily  . divalproex  250 mg Oral QHS  . enoxaparin (LOVENOX) injection  40 mg Subcutaneous Daily  . metoprolol tartrate  25 mg Oral BID  . pantoprazole  40 mg Oral Daily  . QUEtiapine  25 mg Oral QHS  . sodium chloride flush  3 mL Intravenous Q12H   Continuous Infusions: . cefTRIAXone (ROCEPHIN)  IV Stopped (09/09/17 0038)     LOS: 2 days    Time spent: > 35 minutes    Velvet Bathe, MD [facility] Pager [phone]  If 7PM-7AM, please contact night-coverage [email or web address] Password [email or web address] 09/09/2017, 4:02 PM

## 2017-09-10 MED ORDER — LORAZEPAM 2 MG/ML IJ SOLN
1.0000 mg | Freq: Four times a day (QID) | INTRAMUSCULAR | Status: DC | PRN
  Administered 2017-09-10: 2 mg via INTRAVENOUS
  Administered 2017-09-10 – 2017-09-11 (×3): 1 mg via INTRAVENOUS
  Administered 2017-09-12 – 2017-09-15 (×5): 2 mg via INTRAVENOUS
  Filled 2017-09-10 (×8): qty 1

## 2017-09-10 MED ORDER — LORAZEPAM 2 MG/ML IJ SOLN
INTRAMUSCULAR | Status: AC
  Filled 2017-09-10: qty 1

## 2017-09-10 NOTE — Progress Notes (Signed)
PT Cancellation Note  Patient Details Name: OTTIE TILLERY MRN: [id number] DOB: 1946/09/26   Cancelled Treatment:    Reason Eval/Treat Not Completed: Medical issues which prohibited therapy. Patient combative today, Ox1.  Will return at later time for PT session.   Despina Pole 09/10/2017, 6:14 PM Carita Pian. Sanjuana Kava, [place] Pager [phone]

## 2017-09-10 NOTE — Progress Notes (Signed)
Pt very aggressive, uncooperative, trying to get OOB. Oriented to self. Trying to leave to find radio. MD notified

## 2017-09-10 NOTE — Progress Notes (Signed)
CSW met with patient to provide him with information obtained from the previous day, on what his costs would be for rehab and working on a payment plan for SNF. Patient was unable to engage in conversation; he was talking about finding his name on a list for asbestos and indicated that he couldn't remember what he had talked about the previous day. Patient's thoughts appeared scattered and he was unable to engage in coherent conversation about discharge plan.   CSW contacted by MD to discuss, and provided information about patient appearing to have increased confusion today as compared to discussion yesterday. CSW will continue to follow.  Laveda Abbe, [place] Clinical Social Worker [phone]

## 2017-09-10 NOTE — Progress Notes (Signed)
PROGRESS NOTE    Evan Charles  GYI:[id number] DOB: 1946/09/06 DOA: 09/06/2017 PCP: [facility]   Brief Narrative:   71 y.o. male with medical history significant for dementia failure to thrive and physical deconditioning, history of prostate cancer, GERD, hypertension and spinal stenosis.  The patient was recently discharged on 10/24 after an admission for rhabdomyolysis with an initial CK level of 3599.  During that previous admission he was noted with altered mental status and was evaluated by psychiatry who determined that the patient did not have decision-making capacity.   Pt presented after fall from wheelchair and had abnormal blood culture. Awaiting further cultures to see if last one was real vs contaminant. Also there is concern about capacitiy. Psychiatry consulted as this will affect discharge plannign.    Assessment & Plan:   Principal Problem:   Gram-negative bacteremia - repeated, question is whether last one was contaminant or not. - continue antibiotic regimen - Cultures remain negative as of date.  Active Problems:   Dementia - pt on seroquel - prior reports of patient not having capacity, will need to establish someone who will make decisions on patient's behalf. Social worker consulted and question is whether patient has capacity or not. Psychiatry consulted.    GERD (gastroesophageal reflux disease) - stable on ppi    Frequent falls - place on fall precautions - Physical therapy    Dehydration, moderate - resolved after ehydration with IV fluids, will saline lock    FTT (failure to thrive) in adult   HTN (hypertension) - pt is on amlodipine, metoprolol  DVT prophylaxis: Lovenox Code Status: Full Family Communication: none at bedside. Disposition Plan: pending psych consult to assess for capacity.    Consultants:   none   Procedures: none   Antimicrobials: rocephin   Subjective: Pt has no new complaints. Pt developed  agitation and nursing called for assistance  Objective: Vitals:   09/10/17 0500 09/10/17 0508 09/10/17 1415 09/10/17 1503  BP:  126/67 (!) 129/111   Pulse:  95 74   Resp:  18 20 15   Temp:  98.7 F (37.1 C) 98.3 F (36.8 C)   TempSrc:  Oral Oral   SpO2:  98% 100%   Weight: 75.6 kg (166 lb 10.7 oz)     Height:        Intake/Output Summary (Last 24 hours) at 09/10/17 1909 Last data filed at 09/10/17 1400  Gross per 24 hour  Intake              840 ml  Output              800 ml  Net               40 ml   Filed Weights   09/08/17 0545 09/09/17 0503 09/10/17 0500  Weight: 80.5 kg (177 lb 8 oz) 76.3 kg (168 lb 3.4 oz) 75.6 kg (166 lb 10.7 oz)    Examination: Exam unchanged when compared to 09/09/2017  General exam: Appears calm and comfortable, in nad. Respiratory system: Clear to auscultation. Respiratory effort normal. Equal chest rise Cardiovascular system: S1 & S2 heard, RRR. No JVD, murmurs, rubs, . Gastrointestinal system: Abdomen is nondistended, soft and nontender. No organomegaly or masses felt. Normal bowel sounds heard. Central nervous system: Alert and awake, no facial asymmetry Extremities: Symmetric 5 x 5 power. Skin: No rashes, lesions or ulcers, on limited exam. Psychiatry: pt pleasantly confused as such unable to assess well  Data Reviewed:  I have personally reviewed following labs and imaging studies  CBC:  Recent Labs Lab 09/06/17 1615 09/06/17 1631 09/08/17 0233  WBC 5.5  --  2.3*  NEUTROABS 3.3  --   --   HGB 13.4 14.3 11.7*  HCT 41.6 42.0 35.5*  MCV 87.6  --  89.2  PLT 311  --  026   Basic Metabolic Panel:  Recent Labs Lab 09/06/17 1615 09/06/17 1631 09/08/17 0233  NA 140 142 137  K 4.4 4.6 3.7  CL 105 104 104  CO2 27  --  25  GLUCOSE 97 95 102*  BUN 16 24* 13  CREATININE 0.90 0.80 0.93  CALCIUM 9.2  --  8.4*   GFR: Estimated Creatinine Clearance: 72.9 mL/min (by C-G formula based on SCr of 0.93 mg/dL). Liver Function  Tests:  Recent Labs Lab 09/06/17 1615 09/08/17 0233  AST 29 19  ALT 24 19  ALKPHOS 46 34*  BILITOT 0.9 0.6  PROT 7.2 5.3*  ALBUMIN 3.8 2.9*   No results for input(s): LIPASE, AMYLASE in the last 168 hours.  Recent Labs Lab 09/06/17 1615  AMMONIA 39*   Coagulation Profile: No results for input(s): INR, PROTIME in the last 168 hours. Cardiac Enzymes:  Recent Labs Lab 09/06/17 1615  CKTOTAL 404*   BNP (last 3 results) No results for input(s): PROBNP in the last 8760 hours. HbA1C: No results for input(s): HGBA1C in the last 72 hours. CBG:  Recent Labs Lab 09/05/17 1254  GLUCAP 97   Lipid Profile: No results for input(s): CHOL, HDL, LDLCALC, TRIG, CHOLHDL, LDLDIRECT in the last 72 hours. Thyroid Function Tests: No results for input(s): TSH, T4TOTAL, FREET4, T3FREE, THYROIDAB in the last 72 hours. Anemia Panel: No results for input(s): VITAMINB12, FOLATE, FERRITIN, TIBC, IRON, RETICCTPCT in the last 72 hours. Sepsis Labs:  Recent Labs Lab 09/06/17 1631  LATICACIDVEN 1.76    Recent Results (from the past 240 hour(s))  Blood Culture (routine x 2)     Status: None (Preliminary result)   Collection Time: 09/06/17  4:00 PM  Result Value Ref Range Status   Specimen Description BLOOD LEFT FOREARM  Final   Special Requests   Final    BOTTLES DRAWN AEROBIC AND ANAEROBIC Blood Culture adequate volume   Culture NO GROWTH 4 DAYS  Final   Report Status PENDING  Incomplete  Blood Culture (routine x 2)     Status: None (Preliminary result)   Collection Time: 09/06/17  4:15 PM  Result Value Ref Range Status   Specimen Description BLOOD LEFT ANTECUBITAL  Final   Special Requests   Final    BOTTLES DRAWN AEROBIC AND ANAEROBIC Blood Culture adequate volume   Culture  Setup Time   Final    ANAEROBIC BOTTLE ONLY GRAM NEGATIVE RODS CRITICAL RESULT CALLED TO, READ BACK BY AND VERIFIED WITH: N GAZDA,PHARMD AT 0707 09/07/17 BY L BENFIELD    Culture GRAM NEGATIVE RODS  Final    Report Status PENDING  Incomplete  Blood Culture ID Panel (Reflexed)     Status: None   Collection Time: 09/06/17  4:15 PM  Result Value Ref Range Status   Enterococcus species NOT DETECTED NOT DETECTED Final   Listeria monocytogenes NOT DETECTED NOT DETECTED Final   Staphylococcus species NOT DETECTED NOT DETECTED Final   Staphylococcus aureus NOT DETECTED NOT DETECTED Final   Streptococcus species NOT DETECTED NOT DETECTED Final   Streptococcus agalactiae NOT DETECTED NOT DETECTED Final   Streptococcus pneumoniae NOT DETECTED NOT  DETECTED Final   Streptococcus pyogenes NOT DETECTED NOT DETECTED Final   Acinetobacter baumannii NOT DETECTED NOT DETECTED Final   Enterobacteriaceae species NOT DETECTED NOT DETECTED Final   Enterobacter cloacae complex NOT DETECTED NOT DETECTED Final   Escherichia coli NOT DETECTED NOT DETECTED Final   Klebsiella oxytoca NOT DETECTED NOT DETECTED Final   Klebsiella pneumoniae NOT DETECTED NOT DETECTED Final   Proteus species NOT DETECTED NOT DETECTED Final   Serratia marcescens NOT DETECTED NOT DETECTED Final   Haemophilus influenzae NOT DETECTED NOT DETECTED Final   Neisseria meningitidis NOT DETECTED NOT DETECTED Final   Pseudomonas aeruginosa NOT DETECTED NOT DETECTED Final   Candida albicans NOT DETECTED NOT DETECTED Final   Candida glabrata NOT DETECTED NOT DETECTED Final   Candida krusei NOT DETECTED NOT DETECTED Final   Candida parapsilosis NOT DETECTED NOT DETECTED Final   Candida tropicalis NOT DETECTED NOT DETECTED Final  Culture, Urine     Status: None   Collection Time: 09/07/17  8:58 AM  Result Value Ref Range Status   Specimen Description URINE, RANDOM  Final   Special Requests NONE  Final   Culture NO GROWTH  Final   Report Status 09/08/2017 FINAL  Final  Culture, blood (Routine X 2) w Reflex to ID Panel     Status: None (Preliminary result)   Collection Time: 09/07/17  9:40 AM  Result Value Ref Range Status   Specimen Description  BLOOD RIGHT HAND  Final   Special Requests   Final    BOTTLES DRAWN AEROBIC AND ANAEROBIC Blood Culture results may not be optimal due to an excessive volume of blood received in culture bottles   Culture NO GROWTH 3 DAYS  Final   Report Status PENDING  Incomplete  Culture, blood (Routine X 2) w Reflex to ID Panel     Status: None (Preliminary result)   Collection Time: 09/07/17  9:50 AM  Result Value Ref Range Status   Specimen Description BLOOD LEFT HAND  Final   Special Requests   Final    BOTTLES DRAWN AEROBIC AND ANAEROBIC Blood Culture results may not be optimal due to an excessive volume of blood received in culture bottles   Culture NO GROWTH 3 DAYS  Final   Report Status PENDING  Incomplete     Radiology Studies: No results found.   Scheduled Meds: . amLODipine  10 mg Oral Daily  . aspirin  325 mg Oral Daily  . divalproex  250 mg Oral QHS  . enoxaparin (LOVENOX) injection  40 mg Subcutaneous Daily  . metoprolol tartrate  25 mg Oral BID  . pantoprazole  40 mg Oral Daily  . QUEtiapine  25 mg Oral QHS  . sodium chloride flush  3 mL Intravenous Q12H   Continuous Infusions: . cefTRIAXone (ROCEPHIN)  IV Stopped (09/10/17 0018)     LOS: 3 days    Time spent: > 35 minutes  Velvet Bathe, MD [facility] Pager [phone]  If 7PM-7AM, please contact night-coverage [email or web address] Password [email or web address] 09/10/2017, 7:09 PM

## 2017-09-10 NOTE — Progress Notes (Signed)
CSW following to facilitate discharge planning.  CSW completed information gathering yesterday to obtain information on patient's status and previous history, as patient has multiple readmissions and is well known within [place] department. Patient had recent placement at [name], and is currently in his copay days with his insurance but is refusing to pay because he wants to be able to pay off his house. Patient has an active APS case open, but APS is not applying for guardianship at this time, and there is no family that is willing to step up and be POA for him. Patient has a nephew, [name], who helps sometimes but is not willing to do everything that is needed to provide continuous care for the patient. Patient has VA, but is not benefit connected, therefore does not qualify for rehab services through the [place] at this time. Per previous discussions with Medical Director, patient has capacity to make own decisions (in psych consult dated 10/19, patient scored 22/30 on MMSE and was fully oriented), and is alert and oriented currently.   CSW spoke with patient's nephew yesterday and spoke with patient, who said that he was agreeable to going to SNF but would not pay. CSW provided information on what the copay costs would be, and that facility could work with him on setting up a payment plan. Patient indicated that he would not be able to offer much money for setting up a payment plan because he wanted to pay off his house. CSW contacted patient's previous facility, [facility], to discuss the patient and what payment plan options were available. [facility] admissions rep indicated that it would not be an option for the patient to return, because he already has a balance with them and he has been refusing to pay; they would not admit him back.   CSW spoke with Medical Director again today to verify that the patient was able to make bad decisions and would be returning home, if the patient chooses to do so. Medical  Director is in agreement that patient should be discharged home, if that is the patient's choice.  CSW to update patient and then medical team with the patient's discharge decision.  Laveda Abbe, [place] Clinical Social Worker [phone]

## 2017-09-11 DIAGNOSIS — R627 Adult failure to thrive: Secondary | ICD-10-CM

## 2017-09-11 DIAGNOSIS — Z87891 Personal history of nicotine dependence: Secondary | ICD-10-CM

## 2017-09-11 DIAGNOSIS — F0280 Dementia in other diseases classified elsewhere without behavioral disturbance: Secondary | ICD-10-CM

## 2017-09-11 DIAGNOSIS — F0390 Unspecified dementia without behavioral disturbance: Secondary | ICD-10-CM

## 2017-09-11 DIAGNOSIS — I10 Essential (primary) hypertension: Secondary | ICD-10-CM

## 2017-09-11 DIAGNOSIS — K219 Gastro-esophageal reflux disease without esophagitis: Secondary | ICD-10-CM

## 2017-09-11 DIAGNOSIS — R410 Disorientation, unspecified: Secondary | ICD-10-CM

## 2017-09-11 DIAGNOSIS — G9341 Metabolic encephalopathy: Secondary | ICD-10-CM

## 2017-09-11 DIAGNOSIS — G3183 Dementia with Lewy bodies: Secondary | ICD-10-CM

## 2017-09-11 DIAGNOSIS — R296 Repeated falls: Secondary | ICD-10-CM

## 2017-09-11 LAB — CULTURE, BLOOD (ROUTINE X 2)
Culture: NO GROWTH
Special Requests: ADEQUATE
Special Requests: ADEQUATE

## 2017-09-11 NOTE — Care Management Important Message (Signed)
Important Message  Patient Details  Name: Evan Charles MRN: [id number] Date of Birth: [birth year]   Medicare Important Message Given:  Yes    Orbie Pyo 09/11/2017, 12:33 PM

## 2017-09-11 NOTE — Progress Notes (Signed)
PROGRESS NOTE    Evan Charles  WUX:[id number] DOB: 01-21-1946 DOA: 09/06/2017 PCP: [facility]   Brief Narrative:   Evan Charles is a 71 y.o. male with medical history significant for dementia failure to thrive and physical deconditioning, history of prostate cancer, GERD, hypertension and spinal stenosis.  The patient was recently discharged on 10/24 after an admission for rhabdomyolysis, during that previous admission he was noted with altered mental status and was evaluated by psychiatry who determined that the patient did not have decision-making capacity. SNF was advised but apparently the patient refused to go to nursing facility because he wanted to go home and did not want to spend the money required to go to a nursing facility.  Unfortunately there is no guardianship in place and no family or POA available to enforce patient placement into nursing facility.  Sent home with home health services and Adult Protective Services following after discharge.  Returned back to the ER on 10/25 after [employer] went to perform a welfare check and found the patient on the floor, unclothed laying against his wheelchair and he had been incontinent of urine and/or stool.  The police officers subsequently dressed the patient and called EMS.  No acute issues were found in the ER and he did not meet requirements for admission.  Social work was consulted to assist with nursing facility placement but the patient once again refused and left the ER AMA.  He returned back to the ER on 10/26 after once again falling.  CSW was consulted for Dispo options, while this was ongoing, patient had 1 of 2 blood cultures returned positive for GNR's although the BCID demonstrated no organisms, admitted 10/27 and started Abx  Assessment & Plan:    1/2  Gram-negative bacteremia -from blood cultures drawn 10/26 in the emergency room, one out of 2 with negative BCR ID -Urine cultures negative -No evidence of fevers  or leukocytosis -Clinically I suspect this is a contaminant, however will continue IV ceftriaxone until final ID and sensitivities back, we will discuss with infectious disease then   Dementia -patient does not have capacity to make decisions on his behalf and unfortunately does not have a power of attorney/estranged from daughter -continue Seroquel per home regimen -Psychiatry consulted, agrees that patient does not have capacity    GERD (gastroesophageal reflux disease) - stable on ppi    Frequent falls/gait disorder/debility/spinal stenosis - Physical therapy following, SNF recommended    Dehydration, moderate - resolved after rehydration    FTT (failure to thrive) in adult    HTN (hypertension) -stable on amlodipine, metoprolol  DVT prophylaxis: Lovenox Code Status: Full Family Communication: none at bedside. Disposition Plan: pending psych consult to assess for capacity.    Consultants:   none   Procedures: none   Antimicrobials: rocephin   Subjective: -you no issues overnight, remains confused about rehabilitation  Objective: Vitals:   09/10/17 1415 09/10/17 1503 09/10/17 2048 09/11/17 0620  BP: (!) 129/111  (!) 116/91 122/88  Pulse: 74  95 92  Resp: 20 15 19 18   Temp: 98.3 F (36.8 C)  98.1 F (36.7 C) 97.8 F (36.6 C)  TempSrc: Oral  Oral Oral  SpO2: 100%  100% 100%  Weight:      Height:        Intake/Output Summary (Last 24 hours) at 09/11/17 1336 Last data filed at 09/11/17 1232  Gross per 24 hour  Intake  823 ml  Output             2150 ml  Net            -1327 ml   Filed Weights   09/08/17 0545 09/09/17 0503 09/10/17 0500  Weight: 80.5 kg (177 lb 8 oz) 76.3 kg (168 lb 3.4 oz) 75.6 kg (166 lb 10.7 oz)    Gen: alert, awake, oriented to self and only partly to place HEENT: PERRLA, Neck supple, no JVD Lungs: Good air movement bilaterally, CTAB CVS: RRR,No Gallops,Rubs or new Murmurs Abd: soft, Non tender, non distended, BS  present Extremities: No Cyanosis, Clubbing or edema Skin: no new rashes  Data Reviewed: I have personally reviewed following labs and imaging studies  CBC:  Recent Labs Lab 09/06/17 1615 09/06/17 1631 09/08/17 0233  WBC 5.5  --  2.3*  NEUTROABS 3.3  --   --   HGB 13.4 14.3 11.7*  HCT 41.6 42.0 35.5*  MCV 87.6  --  89.2  PLT 311  --  660   Basic Metabolic Panel:  Recent Labs Lab 09/06/17 1615 09/06/17 1631 09/08/17 0233  NA 140 142 137  K 4.4 4.6 3.7  CL 105 104 104  CO2 27  --  25  GLUCOSE 97 95 102*  BUN 16 24* 13  CREATININE 0.90 0.80 0.93  CALCIUM 9.2  --  8.4*   GFR: Estimated Creatinine Clearance: 72.9 mL/min (by C-G formula based on SCr of 0.93 mg/dL). Liver Function Tests:  Recent Labs Lab 09/06/17 1615 09/08/17 0233  AST 29 19  ALT 24 19  ALKPHOS 46 34*  BILITOT 0.9 0.6  PROT 7.2 5.3*  ALBUMIN 3.8 2.9*   No results for input(s): LIPASE, AMYLASE in the last 168 hours.  Recent Labs Lab 09/06/17 1615  AMMONIA 39*   Coagulation Profile: No results for input(s): INR, PROTIME in the last 168 hours. Cardiac Enzymes:  Recent Labs Lab 09/06/17 1615  CKTOTAL 404*   BNP (last 3 results) No results for input(s): PROBNP in the last 8760 hours. HbA1C: No results for input(s): HGBA1C in the last 72 hours. CBG:  Recent Labs Lab 09/05/17 1254  GLUCAP 97   Lipid Profile: No results for input(s): CHOL, HDL, LDLCALC, TRIG, CHOLHDL, LDLDIRECT in the last 72 hours. Thyroid Function Tests: No results for input(s): TSH, T4TOTAL, FREET4, T3FREE, THYROIDAB in the last 72 hours. Anemia Panel: No results for input(s): VITAMINB12, FOLATE, FERRITIN, TIBC, IRON, RETICCTPCT in the last 72 hours. Sepsis Labs:  Recent Labs Lab 09/06/17 1631  LATICACIDVEN 1.76    Recent Results (from the past 240 hour(s))  Blood Culture (routine x 2)     Status: None   Collection Time: 09/06/17  4:00 PM  Result Value Ref Range Status   Specimen Description BLOOD  LEFT FOREARM  Final   Special Requests   Final    BOTTLES DRAWN AEROBIC AND ANAEROBIC Blood Culture adequate volume   Culture NO GROWTH 5 DAYS  Final   Report Status 09/11/2017 FINAL  Final  Blood Culture (routine x 2)     Status: None (Preliminary result)   Collection Time: 09/06/17  4:15 PM  Result Value Ref Range Status   Specimen Description BLOOD LEFT ANTECUBITAL  Final   Special Requests   Final    BOTTLES DRAWN AEROBIC AND ANAEROBIC Blood Culture adequate volume   Culture  Setup Time   Final    ANAEROBIC BOTTLE ONLY GRAM NEGATIVE RODS CRITICAL RESULT CALLED TO,  READ BACK BY AND VERIFIED WITH: N GAZDA,PHARMD AT 0707 09/07/17 BY L BENFIELD    Culture GRAM NEGATIVE RODS  Final   Report Status PENDING  Incomplete  Blood Culture ID Panel (Reflexed)     Status: None   Collection Time: 09/06/17  4:15 PM  Result Value Ref Range Status   Enterococcus species NOT DETECTED NOT DETECTED Final   Listeria monocytogenes NOT DETECTED NOT DETECTED Final   Staphylococcus species NOT DETECTED NOT DETECTED Final   Staphylococcus aureus NOT DETECTED NOT DETECTED Final   Streptococcus species NOT DETECTED NOT DETECTED Final   Streptococcus agalactiae NOT DETECTED NOT DETECTED Final   Streptococcus pneumoniae NOT DETECTED NOT DETECTED Final   Streptococcus pyogenes NOT DETECTED NOT DETECTED Final   Acinetobacter baumannii NOT DETECTED NOT DETECTED Final   Enterobacteriaceae species NOT DETECTED NOT DETECTED Final   Enterobacter cloacae complex NOT DETECTED NOT DETECTED Final   Escherichia coli NOT DETECTED NOT DETECTED Final   Klebsiella oxytoca NOT DETECTED NOT DETECTED Final   Klebsiella pneumoniae NOT DETECTED NOT DETECTED Final   Proteus species NOT DETECTED NOT DETECTED Final   Serratia marcescens NOT DETECTED NOT DETECTED Final   Haemophilus influenzae NOT DETECTED NOT DETECTED Final   Neisseria meningitidis NOT DETECTED NOT DETECTED Final   Pseudomonas aeruginosa NOT DETECTED NOT  DETECTED Final   Candida albicans NOT DETECTED NOT DETECTED Final   Candida glabrata NOT DETECTED NOT DETECTED Final   Candida krusei NOT DETECTED NOT DETECTED Final   Candida parapsilosis NOT DETECTED NOT DETECTED Final   Candida tropicalis NOT DETECTED NOT DETECTED Final  Culture, Urine     Status: None   Collection Time: 09/07/17  8:58 AM  Result Value Ref Range Status   Specimen Description URINE, RANDOM  Final   Special Requests NONE  Final   Culture NO GROWTH  Final   Report Status 09/08/2017 FINAL  Final  Culture, blood (Routine X 2) w Reflex to ID Panel     Status: None (Preliminary result)   Collection Time: 09/07/17  9:40 AM  Result Value Ref Range Status   Specimen Description BLOOD RIGHT HAND  Final   Special Requests   Final    BOTTLES DRAWN AEROBIC AND ANAEROBIC Blood Culture results may not be optimal due to an excessive volume of blood received in culture bottles   Culture NO GROWTH 4 DAYS  Final   Report Status PENDING  Incomplete  Culture, blood (Routine X 2) w Reflex to ID Panel     Status: None (Preliminary result)   Collection Time: 09/07/17  9:50 AM  Result Value Ref Range Status   Specimen Description BLOOD LEFT HAND  Final   Special Requests   Final    BOTTLES DRAWN AEROBIC AND ANAEROBIC Blood Culture results may not be optimal due to an excessive volume of blood received in culture bottles   Culture NO GROWTH 4 DAYS  Final   Report Status PENDING  Incomplete     Radiology Studies: No results found.   Scheduled Meds: . amLODipine  10 mg Oral Daily  . aspirin  325 mg Oral Daily  . divalproex  250 mg Oral QHS  . enoxaparin (LOVENOX) injection  40 mg Subcutaneous Daily  . metoprolol tartrate  25 mg Oral BID  . pantoprazole  40 mg Oral Daily  . QUEtiapine  25 mg Oral QHS  . sodium chloride flush  3 mL Intravenous Q12H   Continuous Infusions: . cefTRIAXone (ROCEPHIN)  IV 1  g (09/10/17 2318)     LOS: 4 days    Time spent: > 35  minutes  Domenic Polite, MD [facility] Page via [email or web address], password [email or web address]  If 7PM-7AM, please contact night-coverage [email or web address] Password [email or web address] 09/11/2017, 1:36 PM

## 2017-09-11 NOTE — Progress Notes (Signed)
Physical Therapy Treatment Patient Details Name: Evan Charles MRN: [id number] DOB: [birth year] Today's Date: 09/11/2017    History of Present Illness 71 y.o. male with a history of spinal stenosis, seizures, visual hallucinations, and dementia. Pt discharged home 10/25 after admission s/p fall with shoulder pain and UTI. He was found to not have capacity to make his own decisions, yet refused SNF and returned home. Came to ED 10/26, 10/27 due to falls. Adm with bacteremia    PT Comments    Patient progressing with ambulation distance and stability this session, but remains at close to 100% risk of falls due to significant LE tone, poor safety awareness, and balance deficits.  Continue to recommend SNF level rehab though with speaking with pt today continues to be resistant.  PT to follow acutely.   Follow Up Recommendations  SNF     Equipment Recommendations  None recommended by PT    Recommendations for Other Services       Precautions / Restrictions Precautions Precautions: Fall Restrictions Weight Bearing Restrictions: No    Mobility  Bed Mobility               General bed mobility comments: up walking with mobility tech initially  Transfers Overall transfer level: Needs assistance Equipment used: Rolling walker (2 wheeled) Transfers: Sit to/from Stand Sit to Stand: Max assist;Mod assist;+2 physical assistance         General transfer comment: heavy lowering assist to sit due to decreased trunk flexion, poor eccentric lowering and decreased R Knee flexion; sit to stand +2 mod A with R foot sliding forward and lifting help needed  Ambulation/Gait Ambulation/Gait assistance: Mod assist;Min assist;+2 safety/equipment Ambulation Distance (Feet): 100 Feet (x 2) Assistive device: Rolling walker (2 wheeled) Gait Pattern/deviations: Step-to pattern;Narrow base of support;Decreased dorsiflexion - right;Decreased dorsiflexion - left;Decreased stride length;Leaning  posteriorly Gait velocity: decreased   General Gait Details: walks on outside of L foot and note increased adductor tone with decreased ankle DF and tendency to lean back, assist for anterior weight shift better with increased speed of gait   Stairs            Wheelchair Mobility    Modified Rankin (Stroke Patients Only)       Balance Overall balance assessment: Needs assistance Sitting-balance support: No upper extremity supported;Feet supported Sitting balance-Leahy Scale: Fair   Postural control: Posterior lean Standing balance support: Bilateral upper extremity supported Standing balance-Leahy Scale: Poor Standing balance comment: assist even with RW with static standing                            Cognition Arousal/Alertness: Awake/alert Behavior During Therapy: WFL for tasks assessed/performed Overall Cognitive Status: History of cognitive impairments - at baseline Area of Impairment: Orientation                 Orientation Level: Place;Time;Situation     Following Commands: Follows one step commands with increased time;Follows one step commands inconsistently Safety/Judgement: Decreased awareness of safety;Decreased awareness of deficits   Problem Solving: Slow processing;Requires verbal cues;Difficulty sequencing General Comments: states he is at "[facility]", difficulty with sitting down in chair that was behind him even with multimodal cues of sequence/technique      Exercises Other Exercises Other Exercises: AAROM/PROM bilateral ankle DF and toe DF esp on L 20 sec hold x 2    General Comments        Pertinent Vitals/Pain Pain Assessment:  No/denies pain    Home Living                      Prior Function            PT Goals (current goals can now be found in the care plan section) Progress towards PT goals: Progressing toward goals    Frequency    Min 2X/week      PT Plan Current plan remains appropriate     Co-evaluation              AM-PAC PT "6 Clicks" Daily Activity  Outcome Measure  Difficulty turning over in bed (including adjusting bedclothes, sheets and blankets)?: Unable Difficulty moving from lying on back to sitting on the side of the bed? : Unable Difficulty sitting down on and standing up from a chair with arms (e.g., wheelchair, bedside commode, etc,.)?: Unable Help needed moving to and from a bed to chair (including a wheelchair)?: A Lot Help needed walking in hospital room?: A Lot Help needed climbing 3-5 steps with a railing? : Total 6 Click Score: 8    End of Session Equipment Utilized During Treatment: Gait belt Activity Tolerance: Patient tolerated treatment well Patient left: in chair;with call bell/phone within reach;with nursing/sitter in room;with chair alarm set   PT Visit Diagnosis: Unsteadiness on feet (R26.81);Repeated falls (R29.6);Muscle weakness (generalized) (M62.81);Difficulty in walking, not elsewhere classified (R26.2)     Time: 1660-6301 PT Time Calculation (min) (ACUTE ONLY): 33 min  Charges:  $Gait Training: 23-37 mins                    G CodesMagda Charles, [place] [phone] 09/11/2017'   Evan Charles 09/11/2017, 12:34 PM

## 2017-09-11 NOTE — Consult Note (Signed)
[facility] Consult   Reason for Consult:  Capacity evaluation Referring Physician:  Dr. Durenda Hurt Patient Identification: Evan Charles MRN:  [id number] Principal Diagnosis: Gram-negative bacteremia Diagnosis:   Patient Active Problem List   Diagnosis Date Noted  . Gram-negative bacteremia [R78.81] 09/07/2017  . Dementia [F03.90] 09/07/2017  . GERD (gastroesophageal reflux disease) [K21.9] 09/07/2017  . Frequent falls [R29.6] 09/07/2017  . Metabolic encephalopathy [M08.67] 09/07/2017  . Dehydration, moderate [E86.0] 09/07/2017  . FTT (failure to thrive) in adult [R62.7] 09/07/2017  . HTN (hypertension) [I10] 09/07/2017  . Prediabetes [R73.03] 06/08/2017  . Stroke (cerebrum) ([place]) [I63.9]   . Cerebral thrombosis with cerebral infarction [I63.30] 06/07/2017  . Dystonia [G24.9] 06/07/2017  . Weakness [R53.1] 06/06/2017  . Accelerated hypertension [I10] 06/06/2017  . Leg weakness [R29.898] 04/28/2017  . Spinal stenosis of lumbar region without neurogenic claudication [M48.061] 04/28/2017  . Impaired mobility and ADLs [Z74.09]   . Hypokalemia [E87.6] 11/05/2016  . 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 Acute delirium [R41.0] 09/08/2016  . Normocytic anemia [D64.9] 09/08/2016  . Acute metabolic encephalopathy [Y19.50] 09/08/2016  . Rhabdomyolysis [M62.82] 09/08/2016  . Fall at home [W19.XXXA, Y92.009]   . Fall [W19.XXXA] 07/25/2016  . Mild cognitive impairment [G31.84] 07/25/2016  . Immobility [Z74.09] 03/20/2016  . Generalized weakness [R53.1] 03/20/2016  . Failure to thrive in adult [R62.7] 03/20/2016  . Neuropathy [G62.9]   . Essential hypertension [I10]   . Physical deconditioning [R53.81]   . 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 GERD without esophagitis [K21.9] 12/19/2015  . Bilateral lower extremity edema [R60.0] 12/19/2015  . Intractable low back pain [M54.5] 02/14/2015  . Difficulty walking [R26.2] 02/14/2015  .  H/O: upper GI bleed [Z87.19]   . Visual hallucinations [R44.1]   . Psychoses ([place]) [F29]     Total Time spent with patient: 45 minutes  Subjective:   Evan Charles is a 71 y.o. male patient admitted with dementia and frequent falls and fall from a wheel chair.  HPI: Evan Charles is a 71 years old male with a history significant for dementia, failure to thrive and physical deconditioning and had a history of prostate cancer, GERD hypertension spinal stenosis.  Patient was recently seen for psychiatric consultation encounter and deemed incompetent to make his own medical decisions and living arrangements.  He appeared sitting in chair next to the bed and appeared with altered mental status not able to converse appropriately.  Patient is awake, alert and talking with a lot of slowness and inappropriately.  Patient was repaired talking to himself when no questions were asked.  He is also closing his eyes when he is talking.  Patient seems to be somewhat deteriorated since last evaluation few months ago.  Patient seems to be suffering with not only dementia also delirium at this time.  Patient knows he is in [facility], in [place] and his first name and last name but could not answer the rest of the questions.  Patient is not able to understand his physical or mental health conditions that require treatment at this time.  Past Psychiatric History:  Lewy body Dementia, behavioral disturbance with psychosis and recent admission to [facility] center in May 2018. Patient scored 22/30 on MMSE. Rivastigmine was started at 1.5mg  BID on 03/18/2017 and was on quetiapine 37.5 mg at bed time  Risk to Self: Is patient at risk for suicide?: No Risk to Others:   Prior Inpatient Therapy:   Prior Outpatient Therapy:  Past Medical History:  Past Medical History:  Diagnosis Date  . Bilateral lower extremity edema 04/2017  . Chronic mental illness    Seroquel for intermittent  hallucinations, psychiatry states he has capacity  . Colitis   . Dementia   . GERD (gastroesophageal reflux disease)   . Hypertension   . Neuropathy    " MY HANDS "  . Prostate cancer ([place])   . Rhabdomyolysis 09/08/2016  . Spinal stenosis    DDD , no neurogenic claudication  . UTI (urinary tract infection) 04/2017    Past Surgical History:  Procedure Laterality Date  . KNEE ARTHROSCOPY    . LAMINECTOMY    . PROSTATECTOMY     Family History:  Family History  Problem Relation Age of Onset  . Cancer Brother   . Cancer Maternal Grandmother    Family Psychiatric  History: She was not able to provide history of family psychiatric illness. Social History:  History  Alcohol Use No     History  Drug Use No    Social History   Social History  . Marital status: Single    Spouse name: N/A  . Number of children: N/A  . Years of education: N/A   Occupational History  . Retired    Social History Main Topics  . Smoking status: Former Smoker    Types: Cigarettes  . Smokeless tobacco: Never Used  . Alcohol use No  . Drug use: No  . Sexual activity: No   Other Topics Concern  . None   Social History Narrative   Cares for self at home. Has friends to help and uses meals on wheels.    Additional Social History:    Allergies:   Allergies  Allergen Reactions  . Tomato Hives and Itching    Labs: No results found for this or any previous visit (from the past 48 hour(s)).  Current Facility-Administered Medications  Medication Dose Route Frequency Provider Last Rate Last Dose  . acetaminophen (TYLENOL) tablet 650 mg  650 mg Oral Q6H PRN Samella Parr, NP   650 mg at 09/09/17 1019   Or  . acetaminophen (TYLENOL) suppository 650 mg  650 mg Rectal Q6H PRN Samella Parr, NP      . amLODipine (NORVASC) tablet 10 mg  10 mg Oral Daily Samella Parr, NP   10 mg at 09/11/17 0932  . aspirin tablet 325 mg  325 mg Oral Daily Margarita Mail, PA-C   Stopped at 09/11/17  1000  . cefTRIAXone (ROCEPHIN) 1 g in dextrose 5 % 50 mL IVPB  1 g Intravenous Q24H Samella Parr, NP 100 mL/hr at 09/10/17 2318 1 g at 09/10/17 2318  . divalproex (DEPAKOTE) DR tablet 250 mg  250 mg Oral QHS Harris, Abigail, PA-C   250 mg at 09/10/17 2114  . enoxaparin (LOVENOX) injection 40 mg  40 mg Subcutaneous Daily Samella Parr, NP   40 mg at 09/11/17 0932  . ketorolac (TORADOL) 15 MG/ML injection 15 mg  15 mg Intravenous Q6H PRN Samella Parr, NP   15 mg at 09/08/17 0557  . LORazepam (ATIVAN) injection 1-2 mg  1-2 mg Intravenous Q6H PRN Velvet Bathe, MD   1 mg at 09/10/17 1838  . metoprolol tartrate (LOPRESSOR) tablet 25 mg  25 mg Oral BID Margarita Mail, PA-C   25 mg at 09/11/17 0932  . ondansetron (ZOFRAN) tablet 4 mg  4 mg Oral Q6H PRN Samella Parr, NP  Or  . ondansetron (ZOFRAN) injection 4 mg  4 mg Intravenous Q6H PRN Samella Parr, NP      . pantoprazole (PROTONIX) EC tablet 40 mg  40 mg Oral Daily Samella Parr, NP   40 mg at 09/11/17 0932  . QUEtiapine (SEROQUEL) tablet 25 mg  25 mg Oral QHS Samella Parr, NP   25 mg at 09/10/17 2114  . sodium chloride flush (NS) 0.9 % injection 3 mL  3 mL Intravenous Q12H Samella Parr, NP   3 mL at 09/08/17 1005    Musculoskeletal: Strength & Muscle Tone: decreased Gait & Station: unable to stand Patient leans: N/A  Psychiatric Specialty Exam: Physical Exam  ROS  Blood pressure 122/88, pulse 92, temperature 97.8 F (36.6 C), temperature source Oral, resp. rate 18, height 5\' 9"  (1.753 m), weight 75.6 kg (166 lb 10.7 oz), SpO2 100 %.Body mass index is 24.61 kg/m.  General Appearance: Bizarre and Guarded  Eye Contact:  Minimal  Speech:  Garbled, Slow and Slurred  Volume:  Decreased  Mood:  Anxious  Affect:  Constricted and Depressed  Thought Process:  Irrelevant  Orientation:  Full (Time, Place, and Person)  Thought Content:  Illogical and Rumination  Suicidal Thoughts:  No  Homicidal Thoughts:  No   Memory:  Immediate;   Fair Recent;   Poor Remote;   Poor  Judgement:  Impaired  Insight:  Lacking  Psychomotor Activity:  Decreased  Concentration:  Concentration: Poor and Attention Span: Poor  Recall:  Poor  Fund of Knowledge:  Fair  Language:  Fair  Akathisia:  Negative  Handed:  Right  AIMS (if indicated):     Assets:  Catering manager Housing Leisure Time Resilience  ADL's:  Impaired  Cognition:  WNL and Impaired,  Severe  Sleep:        Treatment Plan Summary: 71 years old male with dementia, frequent falls presented with metabolic encephalopathy and gram-negative bacteremia.  Patient seems to be in delirious state during my evaluation unable to provide meaningful conversation during my assessment.  Patient does not seems to be understanding both physical and emotional condition and required treatment needs.  Lewy body dementia Delirium secondary to bacteremia  Recommendation: Based on my evaluation today patient does not meet criteria for capacity to make his own medical decisions or living arrangements. Patient will be better served at skilled nursing facility with rehabilitation or memory unit based on his physical needs Please contact family for medical care for a nephrectomy for the department of social service for guardianship. Appreciate psychiatric consultation and we sign off as of today Please contact [phone] or [phone] if needs further assistance  Disposition: Patient cannot live by himself any longer in his own place patient will be Better served in a skilled nursing facility when medically stable. Supportive therapy provided about ongoing stressors.  Ambrose Finland, MD 09/11/2017 11:42 AM

## 2017-09-12 DIAGNOSIS — E860 Dehydration: Secondary | ICD-10-CM

## 2017-09-12 LAB — CULTURE, BLOOD (ROUTINE X 2)
Culture: NO GROWTH
Culture: NO GROWTH

## 2017-09-12 NOTE — Social Work (Signed)
CSW spoke with daughter about health care POA. CSW explained to her that she could come to hospital and spiritual care can assist if patient is willing to sign. With HCPOA, she would still run into barriers of paying co-pay daily for SNF placement. Daughter indicated that patient does not like anyone to deal with his finances or home. She does not know what to do with her dad and feels overwhelmed as her husband has surgery, she works and she has to find transport to get to hospital for [facility].  CSW will f/u and provide support.  CSW will f/u with clinical supervisor.  Elissa Hefty, LCSW Clinical Social Worker [phone]

## 2017-09-12 NOTE — Social Work (Signed)
CSW to spoke nurse on floor who indicated that patient does not have capacity to sign HCPOA form. CSW called daughter and advised that patient cannot sign HCPOA form with spiritual care if he does not have capacity to do so. She understands but is unsure what to do at this point. Daughter and husband indicated that they have  been here before with patient and he declined to sign anything when he had capacity and would not allow anyone to have access to his funds or his home. She indicated that his mental state has been altered in the past at different points and she does not know what to do. CSW validated their feelings. CSW encouraged them to visit patient as they may have a positive impact on him. Daughter confirmed that Adult Protective Services was involved and patient would not allow them to enter his home and was alert at that time.  CSW will continue to follow for safe discharge plan.  Elissa Hefty, LCSW Clinical Social Worker [phone]

## 2017-09-12 NOTE — Social Work (Addendum)
CSW sent out bed offers to see who can offer SNF bed.  CSW contacted Jayvin Hurrell [phone], Adult Protective Service Social Worker and left a message requesting a call back to discuss case.   CSW will continue to follow.  Elissa Hefty, LCSW Clinical Social Worker [phone]

## 2017-09-12 NOTE — NC FL2 (Signed)
[place] LEVEL OF CARE SCREENING TOOL     IDENTIFICATION  Patient Name: Evan Charles Birthdate: 12/31/1945 Sex: male Admission Date (Current Location): 09/06/2017  [facility] and [place] Number:  Herbalist and Address:  [facility], [place] [address] [place], [place] [address]      Provider Number: [id number]  Attending Physician Name and Address:  Domenic Polite, MD  Relative Name and Phone Number:  Leonia Corona, daughter, [phone]    Current Level of Care: Hospital Recommended Level of Care: [place] Prior Approval Number:    Date Approved/Denied:   PASRR Number: [id number]  Discharge Plan: SNF    Current Diagnoses: Patient Active Problem List   Diagnosis Date Noted  . Gram-negative bacteremia 09/07/2017  . Dementia 09/07/2017  . GERD (gastroesophageal reflux disease) 09/07/2017  . Frequent falls 09/07/2017  . Metabolic encephalopathy 53/29/9242  . Dehydration, moderate 09/07/2017  . FTT (failure to thrive) in adult 09/07/2017  . HTN (hypertension) 09/07/2017  . Prediabetes 06/08/2017  . Stroke (cerebrum) ([place])   . 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 Dystonia 06/07/2017  . Weakness 06/06/2017  . Accelerated hypertension 06/06/2017  . Leg weakness 04/28/2017  . 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 Impaired mobility and ADLs   . Hypokalemia 11/05/2016  . Dementia with psychosis 09/12/2016  . Acute delirium 09/08/2016  . Normocytic anemia 09/08/2016  . Acute metabolic encephalopathy 68/34/1962  . Rhabdomyolysis 09/08/2016  . Fall at home   . Fall 07/25/2016  . Mild cognitive impairment 07/25/2016  . Immobility 03/20/2016  . Generalized weakness 03/20/2016  . Failure to thrive in adult 03/20/2016  . Neuropathy   . Essential hypertension   . Physical deconditioning   . Insomnia related to another mental disorder  12/25/2015  . GERD without esophagitis 12/19/2015  . Bilateral lower extremity edema 12/19/2015  . Intractable low back pain 02/14/2015  . Difficulty walking 02/14/2015  . H/O: upper GI bleed   . Visual hallucinations   . Psychoses ([place])     Orientation RESPIRATION BLADDER Height & Weight     Self, Place, Time  Normal Incontinent, External catheter Weight: 163 lb 12.8 oz (74.3 kg) Height:  5\' 9"  (175.3 cm)  BEHAVIORAL SYMPTOMS/MOOD NEUROLOGICAL BOWEL NUTRITION STATUS      Continent Diet  AMBULATORY STATUS COMMUNICATION OF NEEDS Skin   Extensive Assist Verbally Normal                       Personal Care Assistance Level of Assistance  Bathing, Feeding, Dressing Bathing Assistance: Limited assistance Feeding assistance: Limited assistance Dressing Assistance: Limited assistance     Functional Limitations Info  Sight, Hearing, Speech Sight Info: Adequate Hearing Info: Adequate Speech Info: Adequate    SPECIAL CARE FACTORS FREQUENCY  PT (By licensed PT), OT (By licensed OT)     PT Frequency: 5x week OT Frequency: 5x week            Contractures Contractures Info: Not present    Additional Factors Info  Code Status, Allergies, Psychotropic Code Status Info: Full Code Allergies Info: Tomato Psychotropic Info: Depakote and Seroquel         Current Medications (09/12/2017):  This is the current hospital active medication list Current Facility-Administered Medications  Medication Dose Route Frequency Provider Last Rate Last Dose  . acetaminophen (TYLENOL) tablet 650 mg  650 mg Oral Q6H PRN Samella Parr,  NP   650 mg at 09/11/17 2114   Or  . acetaminophen (TYLENOL) suppository 650 mg  650 mg Rectal Q6H PRN Samella Parr, NP      . amLODipine (NORVASC) tablet 10 mg  10 mg Oral Daily Samella Parr, NP   10 mg at 09/11/17 0932  . aspirin tablet 325 mg  325 mg Oral Daily Margarita Mail, PA-C   325 mg at 09/12/17 1100  . divalproex (DEPAKOTE) DR tablet  250 mg  250 mg Oral QHS Harris, Abigail, PA-C   250 mg at 09/11/17 2215  . enoxaparin (LOVENOX) injection 40 mg  40 mg Subcutaneous Daily Samella Parr, NP   40 mg at 09/12/17 1059  . LORazepam (ATIVAN) injection 1-2 mg  1-2 mg Intravenous Q6H PRN Velvet Bathe, MD   1 mg at 09/11/17 1405  . metoprolol tartrate (LOPRESSOR) tablet 25 mg  25 mg Oral BID Margarita Mail, PA-C   25 mg at 09/11/17 2114  . ondansetron (ZOFRAN) tablet 4 mg  4 mg Oral Q6H PRN Samella Parr, NP       Or  . ondansetron [facility]) injection 4 mg  4 mg Intravenous Q6H PRN Samella Parr, NP      . pantoprazole (PROTONIX) EC tablet 40 mg  40 mg Oral Daily Erin Hearing L, NP   40 mg at 09/12/17 1100  . QUEtiapine (SEROQUEL) tablet 25 mg  25 mg Oral QHS Samella Parr, NP   25 mg at 09/11/17 2114  . sodium chloride flush (NS) 0.9 % injection 3 mL  3 mL Intravenous Q12H Samella Parr, NP   3 mL at 09/12/17 1000     Discharge Medications: Please see discharge summary for a list of discharge medications.  Relevant Imaging Results:  Relevant Lab Results:   Additional Information SSN [id number]  Normajean Baxter, LCSW

## 2017-09-12 NOTE — Progress Notes (Signed)
PROGRESS NOTE    Evan Charles  VQQ:[id number] DOB: 04-Aug-1946 DOA: 09/06/2017 PCP: [facility]   Brief Narrative:   Evan Charles is a 71 y.o. male with medical history significant for dementia failure to thrive and physical deconditioning, history of prostate cancer, GERD, hypertension and spinal stenosis.  The patient was recently discharged on 10/24 after an admission for rhabdomyolysis, during that previous admission he was noted with altered mental status and was evaluated by psychiatry who determined that the patient did not have decision-making capacity. SNF was advised but apparently the patient refused to go to nursing facility because he wanted to go home and did not want to spend the money required to go to a nursing facility.  Unfortunately there is no guardianship in place and no family or POA available to enforce patient placement into nursing facility.  Sent home with home health services and Adult Protective Services following after discharge.  Returned back to the ER on 10/25 after [employer] went to perform a welfare check and found the patient on the floor, unclothed laying against his wheelchair and he had been incontinent of urine and/or stool.  The police officers subsequently dressed the patient and called EMS.  No acute issues were found in the ER and he did not meet requirements for admission.  Social work was consulted to assist with nursing facility placement but the patient once again refused and left the ER AMA.  He returned back to the ER on 10/26 after once again falling.  CSW was consulted for Dispo options, while this was ongoing, patient had 1 of 2 blood cultures returned positive for GNR's although the BCID demonstrated no organisms, admitted 10/27 and started Abx  Assessment & Plan:    1/2  Gram-negative bacteremia -from blood cultures drawn 10/26 in the emergency room, one out of 2 with negative BCR ID -Urine cultures negative -No evidence of fevers  or leukocytosis -1/2 blood cultures with Clostridium perfringens, in the absence of fever, leukocytosis or any clinical evidence of sepsis or active infection I suspect this is a contamination. Called and discussed case with infectious disease on-call Dr. Graylon Good who agreed, he was on IV ceftriaxone which I stopped today   Dementia -patient does not have capacity to make decisions on his behalf and unfortunately does not have a power of attorney/somewhat estranged from daughter -continue Seroquel per home regimen -Psychiatry consulted, agrees that patient does not have capacity  -Social work consulted, I called daughter this morning and advised that she get in touch with the Education officer, museum and look at obtaining power of attorney   GERD (gastroesophageal reflux disease) - stable on ppi    Frequent falls/gait disorder/debility/spinal stenosis - Physical therapy following, SNF recommended    Dehydration, moderate - resolved after rehydration    FTT (failure to thrive) in adult    HTN (hypertension) -stable on amlodipine, metoprolol  DVT prophylaxis: Lovenox Code Status: Full Family Communication: none at bedside, called and discussed with daughter Disposition Plan: SNF   Consultants:   none   Procedures: none   Antimicrobials: rocephin   Subjective: -No issues overnight, remains intermittently confused  Objective: Vitals:   09/11/17 2023 09/12/17 0500 09/12/17 0955 09/12/17 1106  BP: 126/90 122/88 94/60 (!) 84/77  Pulse: 84 72 91 94  Resp: 19 18 18    Temp: 98.3 F (36.8 C) 98.2 F (36.8 C) 97.6 F (36.4 C)   TempSrc:  Oral Oral   SpO2: 97% 96% 100%  Weight:  74.3 kg (163 lb 12.8 oz)    Height:        Intake/Output Summary (Last 24 hours) at 09/12/17 1356 Last data filed at 09/12/17 1100  Gross per 24 hour  Intake             1520 ml  Output             1900 ml  Net             -380 ml   Filed Weights   09/09/17 0503 09/10/17 0500 09/12/17 0500  Weight:  76.3 kg (168 lb 3.4 oz) 75.6 kg (166 lb 10.7 oz) 74.3 kg (163 lb 12.8 oz)    Gen: Awake, Alert, oriented to self and partly to place HEENT: PERRLA, Neck supple, no JVD Lungs: Good air movement bilaterally, CTAB CVS: RRR,No Gallops,Rubs or new Murmurs Abd: soft, Non tender, non distended, BS present Extremities: No Cyanosis, Clubbing or edema Skin: no new rashes  Data Reviewed: I have personally reviewed following labs and imaging studies  CBC:  Recent Labs Lab 09/06/17 1615 09/06/17 1631 09/08/17 0233  WBC 5.5  --  2.3*  NEUTROABS 3.3  --   --   HGB 13.4 14.3 11.7*  HCT 41.6 42.0 35.5*  MCV 87.6  --  89.2  PLT 311  --  267   Basic Metabolic Panel:  Recent Labs Lab 09/06/17 1615 09/06/17 1631 09/08/17 0233  NA 140 142 137  K 4.4 4.6 3.7  CL 105 104 104  CO2 27  --  25  GLUCOSE 97 95 102*  BUN 16 24* 13  CREATININE 0.90 0.80 0.93  CALCIUM 9.2  --  8.4*   GFR: Estimated Creatinine Clearance: 72.9 mL/min (by C-G formula based on SCr of 0.93 mg/dL). Liver Function Tests:  Recent Labs Lab 09/06/17 1615 09/08/17 0233  AST 29 19  ALT 24 19  ALKPHOS 46 34*  BILITOT 0.9 0.6  PROT 7.2 5.3*  ALBUMIN 3.8 2.9*   No results for input(s): LIPASE, AMYLASE in the last 168 hours.  Recent Labs Lab 09/06/17 1615  AMMONIA 39*   Coagulation Profile: No results for input(s): INR, PROTIME in the last 168 hours. Cardiac Enzymes:  Recent Labs Lab 09/06/17 1615  CKTOTAL 404*   BNP (last 3 results) No results for input(s): PROBNP in the last 8760 hours. HbA1C: No results for input(s): HGBA1C in the last 72 hours. CBG: No results for input(s): GLUCAP in the last 168 hours. Lipid Profile: No results for input(s): CHOL, HDL, LDLCALC, TRIG, CHOLHDL, LDLDIRECT in the last 72 hours. Thyroid Function Tests: No results for input(s): TSH, T4TOTAL, FREET4, T3FREE, THYROIDAB in the last 72 hours. Anemia Panel: No results for input(s): VITAMINB12, FOLATE, FERRITIN, TIBC,  IRON, RETICCTPCT in the last 72 hours. Sepsis Labs:  Recent Labs Lab 09/06/17 1631  LATICACIDVEN 1.76    Recent Results (from the past 240 hour(s))  Blood Culture (routine x 2)     Status: None   Collection Time: 09/06/17  4:00 PM  Result Value Ref Range Status   Specimen Description BLOOD LEFT FOREARM  Final   Special Requests   Final    BOTTLES DRAWN AEROBIC AND ANAEROBIC Blood Culture adequate volume   Culture NO GROWTH 5 DAYS  Final   Report Status 09/11/2017 FINAL  Final  Blood Culture (routine x 2)     Status: Abnormal   Collection Time: 09/06/17  4:15 PM  Result Value Ref Range Status  Specimen Description BLOOD LEFT ANTECUBITAL  Final   Special Requests   Final    BOTTLES DRAWN AEROBIC AND ANAEROBIC Blood Culture adequate volume   Culture  Setup Time   Final    ANAEROBIC BOTTLE ONLY GRAM NEGATIVE RODS CRITICAL RESULT CALLED TO, READ BACK BY AND VERIFIED WITH: N GAZDA,PHARMD AT 0707 09/07/17 BY L BENFIELD    Culture CLOSTRIDIUM PERFRINGENS (A)  Final   Report Status 09/11/2017 FINAL  Final  Blood Culture ID Panel (Reflexed)     Status: None   Collection Time: 09/06/17  4:15 PM  Result Value Ref Range Status   Enterococcus species NOT DETECTED NOT DETECTED Final   Listeria monocytogenes NOT DETECTED NOT DETECTED Final   Staphylococcus species NOT DETECTED NOT DETECTED Final   Staphylococcus aureus NOT DETECTED NOT DETECTED Final   Streptococcus species NOT DETECTED NOT DETECTED Final   Streptococcus agalactiae NOT DETECTED NOT DETECTED Final   Streptococcus pneumoniae NOT DETECTED NOT DETECTED Final   Streptococcus pyogenes NOT DETECTED NOT DETECTED Final   Acinetobacter baumannii NOT DETECTED NOT DETECTED Final   Enterobacteriaceae species NOT DETECTED NOT DETECTED Final   Enterobacter cloacae complex NOT DETECTED NOT DETECTED Final   Escherichia coli NOT DETECTED NOT DETECTED Final   Klebsiella oxytoca NOT DETECTED NOT DETECTED Final   Klebsiella pneumoniae NOT  DETECTED NOT DETECTED Final   Proteus species NOT DETECTED NOT DETECTED Final   Serratia marcescens NOT DETECTED NOT DETECTED Final   Haemophilus influenzae NOT DETECTED NOT DETECTED Final   Neisseria meningitidis NOT DETECTED NOT DETECTED Final   Pseudomonas aeruginosa NOT DETECTED NOT DETECTED Final   Candida albicans NOT DETECTED NOT DETECTED Final   Candida glabrata NOT DETECTED NOT DETECTED Final   Candida krusei NOT DETECTED NOT DETECTED Final   Candida parapsilosis NOT DETECTED NOT DETECTED Final   Candida tropicalis NOT DETECTED NOT DETECTED Final  Culture, Urine     Status: None   Collection Time: 09/07/17  8:58 AM  Result Value Ref Range Status   Specimen Description URINE, RANDOM  Final   Special Requests NONE  Final   Culture NO GROWTH  Final   Report Status 09/08/2017 FINAL  Final  Culture, blood (Routine X 2) w Reflex to ID Panel     Status: None (Preliminary result)   Collection Time: 09/07/17  9:40 AM  Result Value Ref Range Status   Specimen Description BLOOD RIGHT HAND  Final   Special Requests   Final    BOTTLES DRAWN AEROBIC AND ANAEROBIC Blood Culture results may not be optimal due to an excessive volume of blood received in culture bottles   Culture NO GROWTH 4 DAYS  Final   Report Status PENDING  Incomplete  Culture, blood (Routine X 2) w Reflex to ID Panel     Status: None (Preliminary result)   Collection Time: 09/07/17  9:50 AM  Result Value Ref Range Status   Specimen Description BLOOD LEFT HAND  Final   Special Requests   Final    BOTTLES DRAWN AEROBIC AND ANAEROBIC Blood Culture results may not be optimal due to an excessive volume of blood received in culture bottles   Culture NO GROWTH 4 DAYS  Final   Report Status PENDING  Incomplete     Radiology Studies: No results found.   Scheduled Meds: . amLODipine  10 mg Oral Daily  . aspirin  325 mg Oral Daily  . divalproex  250 mg Oral QHS  . enoxaparin (LOVENOX) injection  40 mg Subcutaneous Daily    . metoprolol tartrate  25 mg Oral BID  . pantoprazole  40 mg Oral Daily  . QUEtiapine  25 mg Oral QHS  . sodium chloride flush  3 mL Intravenous Q12H   Continuous Infusions:    LOS: 5 days    Time spent: > 35 minutes  Domenic Polite, MD [facility] Page via [email or web address], password [email or web address]  If 7PM-7AM, please contact night-coverage [email or web address] Password [email or web address] 09/12/2017, 1:56 PM

## 2017-09-13 NOTE — Progress Notes (Signed)
Physical Therapy Treatment Patient Details Name: Evan Charles MRN: [id number] DOB: 11/23/1945 Today's Date: 09/13/2017    History of Present Illness 71 y.o. male with a history of spinal stenosis, seizures, visual hallucinations, and dementia. Pt discharged home 10/25 after admission s/p fall with shoulder pain and UTI. He was found to not have capacity to make his own decisions, yet refused SNF and returned home. Came to ED 10/26, 10/27 due to falls. Adm with bacteremia    PT Comments    Patient was OOB in chair upon arrival and sliding toward end of chair (chair alarm was on). Pt was combative when attempting to stand and confused. Slide transfer from recliner to bed with +3 assist required. Pt given Ativan prior to session. RN aware of pt being combative.    Follow Up Recommendations  SNF     Equipment Recommendations  None recommended by PT    Recommendations for Other Services OT consult     Precautions / Restrictions Precautions Precautions: Fall Precaution Comments: can be agitated Restrictions Weight Bearing Restrictions: No Other Position/Activity Restrictions: WBAT    Mobility  Bed Mobility Overal bed mobility: Needs Assistance Bed Mobility: Rolling Rolling: Mod assist;+2 for physical assistance         General bed mobility comments: multimodal cues; use of bed rail  Transfers Overall transfer level: Needs assistance   Transfers:  (slide transfer) Sit to Stand: Total assist (+3 assist)         General transfer comment: pt OOB in chair upon arrival and sliding toward end of chair; pt combative when attempting to stand; slide transfer with use of blanket from recliner to bed with assist of 3 required  Ambulation/Gait                 Stairs            Wheelchair Mobility    Modified Rankin (Stroke Patients Only)       Balance Overall balance assessment: Needs assistance Sitting-balance support: No upper extremity supported;Feet  supported Sitting balance-Leahy Scale: Poor   Postural control: Posterior lean   Standing balance-Leahy Scale: Poor                              Cognition Arousal/Alertness: Awake/alert Behavior During Therapy: Agitated Overall Cognitive Status: History of cognitive impairments - at baseline Area of Impairment: Orientation                 Orientation Level: Place;Time;Situation;Disoriented to Current Attention Level: Focused   Following Commands: Follows one step commands inconsistently Safety/Judgement: Decreased awareness of safety;Decreased awareness of deficits   Problem Solving: Requires verbal cues;Difficulty sequencing;Decreased initiation;Slow processing;Requires tactile cues General Comments: pt reported "i am not tired, I'm playing basketball"; pt given Ativan prior to therapist's arrival      Exercises      General Comments        Pertinent Vitals/Pain Pain Assessment: Faces Faces Pain Scale: No hurt    Home Living                      Prior Function            PT Goals (current goals can now be found in the care plan section) Acute Rehab PT Goals PT Goal Formulation: With patient Time For Goal Achievement: 09/22/17 Potential to Achieve Goals: Fair Progress towards PT goals: Not progressing toward goals - comment  Frequency    Min 2X/week      PT Plan Current plan remains appropriate    Co-evaluation              AM-PAC PT "6 Clicks" Daily Activity  Outcome Measure  Difficulty turning over in bed (including adjusting bedclothes, sheets and blankets)?: Unable Difficulty moving from lying on back to sitting on the side of the bed? : Unable Difficulty sitting down on and standing up from a chair with arms (e.g., wheelchair, bedside commode, etc,.)?: Unable Help needed moving to and from a bed to chair (including a wheelchair)?: Total Help needed walking in hospital room?: Total Help needed climbing 3-5  steps with a railing? : Total 6 Click Score: 6    End of Session   Activity Tolerance: Treatment limited secondary to agitation Patient left: with call bell/phone within reach;with nursing/sitter in room;in bed Nurse Communication: Mobility status PT Visit Diagnosis: Unsteadiness on feet (R26.81);Repeated falls (R29.6);Muscle weakness (generalized) (M62.81);Difficulty in walking, not elsewhere classified (R26.2)     Time: 2641-5830 PT Time Calculation (min) (ACUTE ONLY): 25 min  Charges:  $Therapeutic Activity: 23-37 mins                    G Codes:       Earney Navy, PTA Pager: [phone]     Darliss Cheney 09/13/2017, 4:10 PM

## 2017-09-13 NOTE — Progress Notes (Signed)
PROGRESS NOTE    Evan Charles  Evan Charles:[id number] DOB: 30-Apr-1946 DOA: 09/06/2017 PCP: [facility]   Brief Narrative:   Evan Charles is a 71 y.o. male with medical history significant for dementia failure to thrive and physical deconditioning, history of prostate cancer, GERD, hypertension and spinal stenosis.  The patient was recently discharged on 10/24 after an admission for rhabdomyolysis, during that previous admission he was noted with altered mental status and was evaluated by psychiatry who determined that the patient did not have decision-making capacity. SNF was advised but apparently the patient refused to go to nursing facility because he wanted to go home and did not want to spend the money required to go to a nursing facility.  Unfortunately there is no guardianship in place and no family or POA available to enforce patient placement into nursing facility.  Sent home with home health services and Adult Protective Services following after discharge.  Returned back to the ER on 10/25 after [employer] went to perform a welfare check and found the patient on the floor, unclothed laying against his wheelchair and he had been incontinent of urine and/or stool.  The police officers subsequently dressed the patient and called EMS.  No acute issues were found in the ER and he did not meet requirements for admission.  Social work was consulted to assist with nursing facility placement but the patient once again refused and left the ER AMA.  He returned back to the ER on 10/26 after once again falling.  CSW was consulted for Dispo options, while this was ongoing, patient had 1 of 2 blood cultures returned positive for GNR's although the BCID demonstrated no organisms, admitted 10/27 and started Abx  Assessment & Plan:   1/2  Clostridium perfirngens bacteremia -from blood cultures drawn 10/26 in the emergency room, one out of 2 with negative BCR ID -Urine cultures negative -No evidence of  fevers or leukocytosis -1/2 blood cultures with Clostridium perfringens, in the absence of fever, leukocytosis or any clinical evidence of sepsis or active infection I suspect this is a contamination. Called and discussed case with infectious disease on-call Dr. Graylon Good who agreed, he was on IV ceftriaxone which I stopped 11/1 -remains stable -disposition remains a challenge due to [place] and reluctance to SNF/no HCPoA, CSW following   Dementia -patient does not have capacity to make decisions on his behalf and unfortunately does not have a power of attorney/somewhat estranged from daughter -continue Seroquel per home regimen -Psychiatry consulted, agrees that patient does not have capacity  -Social work consulted, I called daughter 11/1 morning and advised that she get in touch with the social worker and look at obtaining power of attorney, pt doesn't want to give PoA/control of finances to family -[place] working on this, APS involved in case   GERD (gastroesophageal reflux disease) - stable on ppi    Frequent falls/gait disorder/debility/spinal stenosis - Physical therapy following, SNF recommended    Dehydration, moderate - resolved after rehydration    FTT (failure to thrive) in adult    HTN (hypertension) -stable on amlodipine, metoprolol  DVT prophylaxis: Lovenox Code Status: Full Family Communication: none at bedside, called and discussed with daughter 11/1 Disposition Plan: SNF per CSW, medically stable   Consultants:   none   Procedures: none   Antimicrobials: rocephin   Subjective: -no issues, intermittently confused  Objective: Vitals:   09/12/17 1403 09/12/17 2133 09/13/17 0457 09/13/17 0640  BP: 102/60 120/74  131/84  Pulse: 96 98  75  Resp: 18 18  18   Temp: (!) 97.3 F (36.3 C) 99 F (37.2 C)  97.6 F (36.4 C)  TempSrc: Oral Oral  Axillary  SpO2: 100% 99%  98%  Weight:   78.5 kg (173 lb)   Height:        Intake/Output Summary (Last 24  hours) at 09/13/17 1608 Last data filed at 09/13/17 0600  Gross per 24 hour  Intake              470 ml  Output              650 ml  Net             -180 ml   Filed Weights   09/10/17 0500 09/12/17 0500 09/13/17 0457  Weight: 75.6 kg (166 lb 10.7 oz) 74.3 kg (163 lb 12.8 oz) 78.5 kg (173 lb)    Gen: Awake, Alert, oriented to self and partly to place only HEENT: PERRLA, Neck supple, no JVD Lungs: Good air movement bilaterally, CTAB CVS: RRR,No Gallops,Rubs or new Murmurs Abd: soft, Non tender, non distended, BS present Extremities: No Cyanosis, Clubbing or edema Skin: no new rashes  Data Reviewed: I have personally reviewed following labs and imaging studies  CBC:  Recent Labs Lab 09/06/17 1615 09/06/17 1631 09/08/17 0233  WBC 5.5  --  2.3*  NEUTROABS 3.3  --   --   HGB 13.4 14.3 11.7*  HCT 41.6 42.0 35.5*  MCV 87.6  --  89.2  PLT 311  --  725   Basic Metabolic Panel:  Recent Labs Lab 09/06/17 1615 09/06/17 1631 09/08/17 0233  NA 140 142 137  K 4.4 4.6 3.7  CL 105 104 104  CO2 27  --  25  GLUCOSE 97 95 102*  BUN 16 24* 13  CREATININE 0.90 0.80 0.93  CALCIUM 9.2  --  8.4*   GFR: Estimated Creatinine Clearance: 72.9 mL/min (by C-G formula based on SCr of 0.93 mg/dL). Liver Function Tests:  Recent Labs Lab 09/06/17 1615 09/08/17 0233  AST 29 19  ALT 24 19  ALKPHOS 46 34*  BILITOT 0.9 0.6  PROT 7.2 5.3*  ALBUMIN 3.8 2.9*   No results for input(s): LIPASE, AMYLASE in the last 168 hours.  Recent Labs Lab 09/06/17 1615  AMMONIA 39*   Coagulation Profile: No results for input(s): INR, PROTIME in the last 168 hours. Cardiac Enzymes:  Recent Labs Lab 09/06/17 1615  CKTOTAL 404*   BNP (last 3 results) No results for input(s): PROBNP in the last 8760 hours. HbA1C: No results for input(s): HGBA1C in the last 72 hours. CBG: No results for input(s): GLUCAP in the last 168 hours. Lipid Profile: No results for input(s): CHOL, HDL, LDLCALC, TRIG,  CHOLHDL, LDLDIRECT in the last 72 hours. Thyroid Function Tests: No results for input(s): TSH, T4TOTAL, FREET4, T3FREE, THYROIDAB in the last 72 hours. Anemia Panel: No results for input(s): VITAMINB12, FOLATE, FERRITIN, TIBC, IRON, RETICCTPCT in the last 72 hours. Sepsis Labs:  Recent Labs Lab 09/06/17 1631  LATICACIDVEN 1.76    Recent Results (from the past 240 hour(s))  Blood Culture (routine x 2)     Status: None   Collection Time: 09/06/17  4:00 PM  Result Value Ref Range Status   Specimen Description BLOOD LEFT FOREARM  Final   Special Requests   Final    BOTTLES DRAWN AEROBIC AND ANAEROBIC Blood Culture adequate volume   Culture NO GROWTH 5 DAYS  Final  Report Status 09/11/2017 FINAL  Final  Blood Culture (routine x 2)     Status: Abnormal   Collection Time: 09/06/17  4:15 PM  Result Value Ref Range Status   Specimen Description BLOOD LEFT ANTECUBITAL  Final   Special Requests   Final    BOTTLES DRAWN AEROBIC AND ANAEROBIC Blood Culture adequate volume   Culture  Setup Time   Final    ANAEROBIC BOTTLE ONLY GRAM NEGATIVE RODS CRITICAL RESULT CALLED TO, READ BACK BY AND VERIFIED WITH: N GAZDA,PHARMD AT 0707 09/07/17 BY L BENFIELD    Culture CLOSTRIDIUM PERFRINGENS (A)  Final   Report Status 09/11/2017 FINAL  Final  Blood Culture ID Panel (Reflexed)     Status: None   Collection Time: 09/06/17  4:15 PM  Result Value Ref Range Status   Enterococcus species NOT DETECTED NOT DETECTED Final   Listeria monocytogenes NOT DETECTED NOT DETECTED Final   Staphylococcus species NOT DETECTED NOT DETECTED Final   Staphylococcus aureus NOT DETECTED NOT DETECTED Final   Streptococcus species NOT DETECTED NOT DETECTED Final   Streptococcus agalactiae NOT DETECTED NOT DETECTED Final   Streptococcus pneumoniae NOT DETECTED NOT DETECTED Final   Streptococcus pyogenes NOT DETECTED NOT DETECTED Final   Acinetobacter baumannii NOT DETECTED NOT DETECTED Final   Enterobacteriaceae species  NOT DETECTED NOT DETECTED Final   Enterobacter cloacae complex NOT DETECTED NOT DETECTED Final   Escherichia coli NOT DETECTED NOT DETECTED Final   Klebsiella oxytoca NOT DETECTED NOT DETECTED Final   Klebsiella pneumoniae NOT DETECTED NOT DETECTED Final   Proteus species NOT DETECTED NOT DETECTED Final   Serratia marcescens NOT DETECTED NOT DETECTED Final   Haemophilus influenzae NOT DETECTED NOT DETECTED Final   Neisseria meningitidis NOT DETECTED NOT DETECTED Final   Pseudomonas aeruginosa NOT DETECTED NOT DETECTED Final   Candida albicans NOT DETECTED NOT DETECTED Final   Candida glabrata NOT DETECTED NOT DETECTED Final   Candida krusei NOT DETECTED NOT DETECTED Final   Candida parapsilosis NOT DETECTED NOT DETECTED Final   Candida tropicalis NOT DETECTED NOT DETECTED Final  Culture, Urine     Status: None   Collection Time: 09/07/17  8:58 AM  Result Value Ref Range Status   Specimen Description URINE, RANDOM  Final   Special Requests NONE  Final   Culture NO GROWTH  Final   Report Status 09/08/2017 FINAL  Final  Culture, blood (Routine X 2) w Reflex to ID Panel     Status: None   Collection Time: 09/07/17  9:40 AM  Result Value Ref Range Status   Specimen Description BLOOD RIGHT HAND  Final   Special Requests   Final    BOTTLES DRAWN AEROBIC AND ANAEROBIC Blood Culture results may not be optimal due to an excessive volume of blood received in culture bottles   Culture NO GROWTH 5 DAYS  Final   Report Status 09/12/2017 FINAL  Final  Culture, blood (Routine X 2) w Reflex to ID Panel     Status: None   Collection Time: 09/07/17  9:50 AM  Result Value Ref Range Status   Specimen Description BLOOD LEFT HAND  Final   Special Requests   Final    BOTTLES DRAWN AEROBIC AND ANAEROBIC Blood Culture results may not be optimal due to an excessive volume of blood received in culture bottles   Culture NO GROWTH 5 DAYS  Final   Report Status 09/12/2017 FINAL  Final     Radiology  Studies: No results found.  Scheduled Meds: . amLODipine  10 mg Oral Daily  . aspirin  325 mg Oral Daily  . divalproex  250 mg Oral QHS  . enoxaparin (LOVENOX) injection  40 mg Subcutaneous Daily  . metoprolol tartrate  25 mg Oral BID  . pantoprazole  40 mg Oral Daily  . QUEtiapine  25 mg Oral QHS  . sodium chloride flush  3 mL Intravenous Q12H   Continuous Infusions:    LOS: 6 days    Time spent: > 35 minutes  Domenic Polite, MD [facility] Page via [email or web address], password [email or web address]  If 7PM-7AM, please contact night-coverage [email or web address] Password [email or web address] 09/13/2017, 4:08 PM

## 2017-09-13 NOTE — Social Work (Addendum)
CSW following up on bed offers for patient. Many declines by SNf's.  CSW f/u with [facility], [facility] and [facility] on bed offers.   CSW will f/u.  Elissa Hefty, LCSW Clinical Social Worker [phone]

## 2017-09-14 LAB — CREATININE, SERUM
Creatinine, Ser: 0.83 mg/dL (ref 0.61–1.24)
GFR calc non Af Amer: >60 mL/min (ref >60)

## 2017-09-14 NOTE — Progress Notes (Signed)
No changes from yesterday, now seems open to considering SNF, [place] picked by daughter -Social work note reviewed -DC Tourist information centre manager, discussed with RN -Hopefully SNF in 24-48 hours  Domenic Polite, MD

## 2017-09-14 NOTE — Progress Notes (Signed)
Pt very agitated and restless at this time. Pt was trying to get out of bed wanting to go home. Pt not cooperating and can not be redirected. PRN Ativan given for safety.

## 2017-09-14 NOTE — Clinical Social Work Note (Addendum)
Patient not fully oriented. No supports at bedside. CSW handoff said to call daughter for SNF decision. CSW spoke with patient's daughter on the phone and provided bed offers: [facility] and [facility]. She has chosen [facility] because the patient has been there before. Patient still has a Corporate investment banker. This will have to be discontinued for 24 hours prior to discharge to SNF. Hospital liaison for [facility] notified.  Dayton Scrape, [place] [phone]  2:42 pm CSW notified hospital liaison for SNF that telesitter was discontinued at 11:52 am today. She stated that he has to be non-combative for at least 48 hours prior to admission as well as not chemically/physically restrained for this amount of time.   Dayton Scrape, [place]

## 2017-09-15 NOTE — Progress Notes (Signed)
This morning pt was anxious, shouting, restless wants to go home, given Ativan as PRN and slept good, he drunk ensure breakfast and ate lunch more than 50% need feeding assistance.

## 2017-09-15 NOTE — Progress Notes (Signed)
No changes from progress note 11/3 -Patient seen and examined -Agitated last night, received IV Ativan for anxiety and safety per staff, sleeping now -discharge planning per social work  Domenic Polite, MD

## 2017-09-16 NOTE — Progress Notes (Signed)
PROGRESS NOTE    Evan Charles  YJE:[id number] DOB: Nov 01, 1946 DOA: 09/06/2017 PCP: [facility]   Brief Narrative:   Evan Charles is a 71 y.o. male with medical history significant for dementia failure to thrive and physical deconditioning, history of prostate cancer, GERD, hypertension and spinal stenosis.  The patient was recently discharged on 10/24 after an admission for rhabdomyolysis, during that previous admission he was noted with altered mental status and was evaluated by psychiatry who determined that the patient did not have decision-making capacity. SNF was advised but apparently the patient refused to go to nursing facility because he wanted to go home and did not want to spend the money required to go to a nursing facility.  Unfortunately there is no guardianship in place and no family or POA available to enforce patient placement into nursing facility.  Sent home with home health services and Adult Protective Services following after discharge.  Returned back to the ER on 10/25 after [employer] went to perform a welfare check and found the patient on the floor, unclothed laying against his wheelchair and he had been incontinent of urine and/or stool.  The police officers subsequently dressed the patient and called EMS.  No acute issues were found in the ER and he did not meet requirements for admission.  Social work was consulted to assist with nursing facility placement but the patient once again refused and left the ER AMA.  He returned back to the ER on 10/26 after once again falling.  CSW was consulted for Dispo options, while this was ongoing, patient had 1 of 2 blood cultures returned positive for GNR's although the BCID demonstrated no organisms, admitted 10/27 and started Abx  Assessment & Plan:   1/2  Clostridium perfringens bacteremia-consistent with contamination -from blood cultures drawn 10/26 in the emergency room, one out of 2 with negative BCR ID -Urine  cultures negative -No evidence of fevers or leukocytosis -1/2 blood cultures with Clostridium perfringens, in the absence of fever, leukocytosis or any clinical evidence of sepsis or active infection I suspect this is a contamination. Called and discussed case with infectious disease on-call Dr. Baxter Flattery who agreed, he was on IV ceftriaxone which I stopped 11/1 -remains stable, off Abx -disposition remains a challenge due to [place] and reluctance to SNF/no HCPoA, CSW following -finally agreed to SNF and dtr picked a facility, needs to be without sitter or chemical restraints for 48h prior to discharge there-> hopefully tomorrow   Dementia -patient does not have capacity to make decisions on his behalf and unfortunately does not have a power of attorney/somewhat estranged from daughter -continue Seroquel per home regimen -Psychiatry consulted, agrees that patient does not have capacity  -Social work consulted, I called daughter 11/1 morning and advised that she get in touch with the social worker and look at obtaining power of attorney, pt doesn't want to give PoA/control of finances to family -[place] working on this, APS involved in case   GERD (gastroesophageal reflux disease) - stable on ppi    Frequent falls/gait disorder/debility/spinal stenosis - Physical therapy following, SNF recommended    Dehydration, moderate - resolved after rehydration    FTT (failure to thrive) in adult    HTN (hypertension) -stable on amlodipine, metoprolol  DVT prophylaxis: Lovenox Code Status: Full Family Communication: none at bedside, called and discussed with daughter 11/1 Disposition Plan: SNF per CSW, medically stable   Consultants:   none   Procedures: none   Antimicrobials: rocephin  Subjective: -no issues, intermittently confused, was more calm last night, off tele sitter for 48hours  Objective: Vitals:   09/15/17 0452 09/15/17 1328 09/15/17 2113 09/16/17 0401  BP:  133/72 115/74 119/89 107/66  Pulse: 80 84 94 84  Resp: 18 16 18 16   Temp: 97.9 F (36.6 C) 97.8 F (36.6 C) (!) 97.5 F (36.4 C) 97.7 F (36.5 C)  TempSrc: Axillary Oral Oral Axillary  SpO2: 100% 100% 100% 98%  Weight:    72.1 kg (159 lb)  Height:        Intake/Output Summary (Last 24 hours) at 09/16/2017 1148 Last data filed at 09/16/2017 0932 Gross per 24 hour  Intake 1233 ml  Output 1350 ml  Net -117 ml   Filed Weights   09/14/17 0500 09/15/17 0433 09/16/17 0401  Weight: 74.8 kg (165 lb) 74.8 kg (164 lb 14.5 oz) 72.1 kg (159 lb)    Gen: Awake, Alert, Oriented X  To self and place only, chronically ill male HEENT: PERRLA, Neck supple, no JVD Lungs: Good air movement bilaterally, CTAB CVS: RRR,No Gallops,Rubs or new Murmurs Abd: soft, Non tender, non distended, BS present Extremities: No Cyanosis, Clubbing or edema Skin: no new rashes  Data Reviewed: I have personally reviewed following labs and imaging studies  CBC: No results for input(s): WBC, NEUTROABS, HGB, HCT, MCV, PLT in the last 168 hours. Basic Metabolic Panel: Recent Labs  Lab 09/14/17 0545  CREATININE 0.83   GFR: Estimated Creatinine Clearance: 81.6 mL/min (by C-G formula based on SCr of 0.83 mg/dL). Liver Function Tests: No results for input(s): AST, ALT, ALKPHOS, BILITOT, PROT, ALBUMIN in the last 168 hours. No results for input(s): LIPASE, AMYLASE in the last 168 hours. No results for input(s): AMMONIA in the last 168 hours. Coagulation Profile: No results for input(s): INR, PROTIME in the last 168 hours. Cardiac Enzymes: No results for input(s): CKTOTAL, CKMB, CKMBINDEX, TROPONINI in the last 168 hours. BNP (last 3 results) No results for input(s): PROBNP in the last 8760 hours. HbA1C: No results for input(s): HGBA1C in the last 72 hours. CBG: No results for input(s): GLUCAP in the last 168 hours. Lipid Profile: No results for input(s): CHOL, HDL, LDLCALC, TRIG, CHOLHDL, LDLDIRECT in the  last 72 hours. Thyroid Function Tests: No results for input(s): TSH, T4TOTAL, FREET4, T3FREE, THYROIDAB in the last 72 hours. Anemia Panel: No results for input(s): VITAMINB12, FOLATE, FERRITIN, TIBC, IRON, RETICCTPCT in the last 72 hours. Sepsis Labs: No results for input(s): PROCALCITON, LATICACIDVEN in the last 168 hours.  Recent Results (from the past 240 hour(s))  Blood Culture (routine x 2)     Status: None   Collection Time: 09/06/17  4:00 PM  Result Value Ref Range Status   Specimen Description BLOOD LEFT FOREARM  Final   Special Requests   Final    BOTTLES DRAWN AEROBIC AND ANAEROBIC Blood Culture adequate volume   Culture NO GROWTH 5 DAYS  Final   Report Status 09/11/2017 FINAL  Final  Blood Culture (routine x 2)     Status: Abnormal   Collection Time: 09/06/17  4:15 PM  Result Value Ref Range Status   Specimen Description BLOOD LEFT ANTECUBITAL  Final   Special Requests   Final    BOTTLES DRAWN AEROBIC AND ANAEROBIC Blood Culture adequate volume   Culture  Setup Time   Final    ANAEROBIC BOTTLE ONLY GRAM NEGATIVE RODS CRITICAL RESULT CALLED TO, READ BACK BY AND VERIFIED WITH: N GAZDA,PHARMD AT 0707 09/07/17 BY  L BENFIELD    Culture CLOSTRIDIUM PERFRINGENS (A)  Final   Report Status 09/11/2017 FINAL  Final  Blood Culture ID Panel (Reflexed)     Status: None   Collection Time: 09/06/17  4:15 PM  Result Value Ref Range Status   Enterococcus species NOT DETECTED NOT DETECTED Final   Listeria monocytogenes NOT DETECTED NOT DETECTED Final   Staphylococcus species NOT DETECTED NOT DETECTED Final   Staphylococcus aureus NOT DETECTED NOT DETECTED Final   Streptococcus species NOT DETECTED NOT DETECTED Final   Streptococcus agalactiae NOT DETECTED NOT DETECTED Final   Streptococcus pneumoniae NOT DETECTED NOT DETECTED Final   Streptococcus pyogenes NOT DETECTED NOT DETECTED Final   Acinetobacter baumannii NOT DETECTED NOT DETECTED Final   Enterobacteriaceae species NOT  DETECTED NOT DETECTED Final   Enterobacter cloacae complex NOT DETECTED NOT DETECTED Final   Escherichia coli NOT DETECTED NOT DETECTED Final   Klebsiella oxytoca NOT DETECTED NOT DETECTED Final   Klebsiella pneumoniae NOT DETECTED NOT DETECTED Final   Proteus species NOT DETECTED NOT DETECTED Final   Serratia marcescens NOT DETECTED NOT DETECTED Final   Haemophilus influenzae NOT DETECTED NOT DETECTED Final   Neisseria meningitidis NOT DETECTED NOT DETECTED Final   Pseudomonas aeruginosa NOT DETECTED NOT DETECTED Final   Candida albicans NOT DETECTED NOT DETECTED Final   Candida glabrata NOT DETECTED NOT DETECTED Final   Candida krusei NOT DETECTED NOT DETECTED Final   Candida parapsilosis NOT DETECTED NOT DETECTED Final   Candida tropicalis NOT DETECTED NOT DETECTED Final  Culture, Urine     Status: None   Collection Time: 09/07/17  8:58 AM  Result Value Ref Range Status   Specimen Description URINE, RANDOM  Final   Special Requests NONE  Final   Culture NO GROWTH  Final   Report Status 09/08/2017 FINAL  Final  Culture, blood (Routine X 2) w Reflex to ID Panel     Status: None   Collection Time: 09/07/17  9:40 AM  Result Value Ref Range Status   Specimen Description BLOOD RIGHT HAND  Final   Special Requests   Final    BOTTLES DRAWN AEROBIC AND ANAEROBIC Blood Culture results may not be optimal due to an excessive volume of blood received in culture bottles   Culture NO GROWTH 5 DAYS  Final   Report Status 09/12/2017 FINAL  Final  Culture, blood (Routine X 2) w Reflex to ID Panel     Status: None   Collection Time: 09/07/17  9:50 AM  Result Value Ref Range Status   Specimen Description BLOOD LEFT HAND  Final   Special Requests   Final    BOTTLES DRAWN AEROBIC AND ANAEROBIC Blood Culture results may not be optimal due to an excessive volume of blood received in culture bottles   Culture NO GROWTH 5 DAYS  Final   Report Status 09/12/2017 FINAL  Final     Radiology Studies: No  results found.   Scheduled Meds: . amLODipine  10 mg Oral Daily  . aspirin  325 mg Oral Daily  . divalproex  250 mg Oral QHS  . enoxaparin (LOVENOX) injection  40 mg Subcutaneous Daily  . metoprolol tartrate  25 mg Oral BID  . pantoprazole  40 mg Oral Daily  . QUEtiapine  25 mg Oral QHS  . sodium chloride flush  3 mL Intravenous Q12H   Continuous Infusions:    LOS: 9 days    Time spent: > 35 minutes  Domenic Polite, MD [facility] Page via [email or web address], password [email or web address]  If 7PM-7AM, please contact night-coverage [email or web address] Password [email or web address] 09/16/2017, 11:48 AM

## 2017-09-16 NOTE — Progress Notes (Signed)
Occupational Therapy Treatment Patient Details Name: Evan Charles MRN: [id number] DOB: Jun 04, 1946 Today's Date: 09/16/2017    History of present illness 71 y.o. male with a history of spinal stenosis, seizures, visual hallucinations, and dementia. Pt discharged home 10/25 after admission s/p fall with shoulder pain and UTI. He was found to not have capacity to make his own decisions, yet refused SNF and returned home. Came to ED 10/26, 10/27 due to falls. Adm with bacteremia   OT comments  Pt is agreeable and not agitated this session. Pt able to follow 1 step commands consistently with distractions limited in room with direct cues given. Pt required increased time for initiation of tasks. Pt standing and ambulating with use of RW and mod multimodal cues for functional mobility and advancement of RW by therapist. Pt returning to supine with all needs within reach. NT present at time of therapist exit. Pt continues to benefit from acute OT intervention.   Follow Up Recommendations  SNF;Supervision/Assistance - 24 hour    Equipment Recommendations  None recommended by OT    Recommendations for Other Services      Precautions / Restrictions Precautions Precautions: Fall       Mobility Bed Mobility Overal bed mobility: Needs Assistance Bed Mobility: Rolling Rolling: Min assist     Sit to supine: Mod assist   General bed mobility comments: mod A for trunk and R LE  Transfers   Equipment used: Rolling walker (2 wheeled) Transfers: Stand Pivot Transfers Sit to Stand: Mod assist Stand pivot transfers: Mod assist            Balance Overall balance assessment: Needs assistance Sitting-balance support: No upper extremity supported;Feet supported Sitting balance-Leahy Scale: Poor     Standing balance support: Bilateral upper extremity supported Standing balance-Leahy Scale: Poor Standing balance comment: posterior lean with RW          ADL either performed or  assessed with clinical judgement   ADL   Functional mobility during ADLs: Moderate assistance;Cueing for safety;Cueing for sequencing;Rolling walker General ADL Comments: Pt seated in recliner chair and agreeable to transfer to bed. Pt standing with mod lifting assistance. Posterior lean noted. Pt ambulating 5' with RW and assistance to advance RW as well as with balance. Pt following commands for hand placement with increased time.               Cognition Arousal/Alertness: Awake/alert Behavior During Therapy: WFL for tasks assessed/performed Overall Cognitive Status: History of cognitive impairments - at baseline       Orientation Level: Place;Time;Situation;Disoriented to Current Attention Level: Focused   Following Commands: Follows one step commands consistently Safety/Judgement: Decreased awareness of safety;Decreased awareness of deficits   Problem Solving: Requires verbal cues;Difficulty sequencing;Decreased initiation;Slow processing;Requires tactile cues                     Pertinent Vitals/ Pain       Pain Assessment: Faces Faces Pain Scale: No hurt         Frequency  Min 2X/week        Progress Toward Goals  OT Goals(current goals can now be found in the care plan section)  Progress towards OT goals: Progressing toward goals     Plan Discharge plan remains appropriate       AM-PAC PT "6 Clicks" Daily Activity     Outcome Measure   Help from another person eating meals?: A Little Help from another person taking care of personal grooming?:  A Little Help from another person toileting, which includes using toliet, bedpan, or urinal?: A Little Help from another person bathing (including washing, rinsing, drying)?: A Little Help from another person to put on and taking off regular upper body clothing?: A Little Help from another person to put on and taking off regular lower body clothing?: A Little 6 Click Score: 18    End of Session Equipment  Utilized During Treatment: Rolling walker  OT Visit Diagnosis: Unsteadiness on feet (R26.81);Repeated falls (R29.6);Muscle weakness (generalized) (M62.81)   Activity Tolerance Patient tolerated treatment well   Patient Left in bed;with call bell/phone within reach;with bed alarm set;Other (comment)(mats on floor, bed lowered to floor)   Nurse Communication Mobility status;Other (comment)(communicated with NT)        Time: 1440-1505 OT Time Calculation (min): 25 min  Charges: OT General Charges $OT Visit: 1 Visit OT Treatments $Therapeutic Activity: 23-37 mins     Gypsy Decant 09/16/2017, 3:16 PM

## 2017-09-17 DIAGNOSIS — K219 Gastro-esophageal reflux disease without esophagitis: Secondary | ICD-10-CM | POA: Diagnosis not present

## 2017-09-17 DIAGNOSIS — E860 Dehydration: Secondary | ICD-10-CM | POA: Diagnosis not present

## 2017-09-17 DIAGNOSIS — R7881 Bacteremia: Secondary | ICD-10-CM | POA: Diagnosis not present

## 2017-09-17 DIAGNOSIS — F0390 Unspecified dementia without behavioral disturbance: Secondary | ICD-10-CM | POA: Diagnosis not present

## 2017-09-17 DIAGNOSIS — B999 Unspecified infectious disease: Secondary | ICD-10-CM | POA: Diagnosis not present

## 2017-09-17 DIAGNOSIS — B967 Clostridium perfringens [C. perfringens] as the cause of diseases classified elsewhere: Secondary | ICD-10-CM | POA: Diagnosis not present

## 2017-09-17 DIAGNOSIS — G3184 Mild cognitive impairment, so stated: Secondary | ICD-10-CM | POA: Diagnosis not present

## 2017-09-17 DIAGNOSIS — I10 Essential (primary) hypertension: Secondary | ICD-10-CM | POA: Diagnosis not present

## 2017-09-17 DIAGNOSIS — R262 Difficulty in walking, not elsewhere classified: Secondary | ICD-10-CM | POA: Diagnosis not present

## 2017-09-17 DIAGNOSIS — M48061 Spinal stenosis, lumbar region without neurogenic claudication: Secondary | ICD-10-CM | POA: Diagnosis not present

## 2017-09-17 DIAGNOSIS — G9341 Metabolic encephalopathy: Secondary | ICD-10-CM | POA: Diagnosis not present

## 2017-09-17 DIAGNOSIS — R627 Adult failure to thrive: Secondary | ICD-10-CM | POA: Diagnosis not present

## 2017-09-17 DIAGNOSIS — M6281 Muscle weakness (generalized): Secondary | ICD-10-CM | POA: Diagnosis not present

## 2017-09-17 DIAGNOSIS — M5440 Lumbago with sciatica, unspecified side: Secondary | ICD-10-CM | POA: Diagnosis not present

## 2017-09-17 DIAGNOSIS — R296 Repeated falls: Secondary | ICD-10-CM | POA: Diagnosis not present

## 2017-09-17 MED ORDER — ASPIRIN 325 MG PO TABS: 325.0000 mg | ORAL_TABLET | Freq: Every day | ORAL | Status: DC

## 2017-09-17 MED ORDER — AMLODIPINE BESYLATE 10 MG PO TABS: 10.0000 mg | ORAL_TABLET | Freq: Every day | ORAL | Status: DC

## 2017-09-17 MED ORDER — TRAMADOL HCL 50 MG PO TABS: 50.0000 mg | ORAL_TABLET | Freq: Four times a day (QID) | ORAL | 0 refills | Status: DC | PRN

## 2017-09-17 NOTE — Progress Notes (Signed)
Report given to nurse at [facility].

## 2017-09-17 NOTE — Plan of Care (Signed)
  Safety: Ability to remain free from injury will improve 09/17/2017 0155 - Adequate for Discharge by Irish Lack, RN   Pain Managment: General experience of comfort will improve 09/17/2017 0155 - Adequate for Discharge by Irish Lack, RN   Activity: Risk for activity intolerance will decrease 09/17/2017 0155 - Adequate for Discharge by Irish Lack, RN

## 2017-09-17 NOTE — Progress Notes (Signed)
Physical Therapy Treatment Patient Details Name: Evan Charles MRN: [id number] DOB: 1946/09/30 Today's Date: 09/17/2017    History of Present Illness 71 y.o. male with a history of spinal stenosis, seizures, visual hallucinations, and dementia. Pt discharged home 10/25 after admission s/p fall with shoulder pain and UTI. He was found to not have capacity to make his own decisions, yet refused SNF and returned home. Came to ED 10/26, 10/27 due to falls. Adm with bacteremia    PT Comments    Session focused on gait training. Pt mod A with sit<>stand transfers with repetitive cues for safe hand placement and safety awareness with RW. Pt demonstrating improved activity tolerance from prior session. Pt ambulated 75 feet min A with narrow base of support short step to gait pattern with decreased velocity.  Cues for mechanics with intermittent progress. Per RN, pt d/cing to SNF this afternoon. Feel this is appropriate venue of care to address mobility deficits.    Follow Up Recommendations  SNF     Equipment Recommendations  None recommended by PT    Recommendations for Other Services       Precautions / Restrictions Precautions Precautions: Fall Precaution Comments: can be agitated Restrictions Weight Bearing Restrictions: No Other Position/Activity Restrictions: WBAT    Mobility  Bed Mobility               General bed mobility comments: in chair upon entry  Transfers Overall transfer level: Needs assistance Equipment used: Rolling walker (2 wheeled)   Sit to Stand: Mod assist         General transfer comment: mod A to power up into walker. cues for safe hand placement with RW  Ambulation/Gait Ambulation/Gait assistance: Min assist;Min guard Ambulation Distance (Feet): 75 Feet Assistive device: Rolling walker (2 wheeled) Gait Pattern/deviations: Step-to pattern;Narrow base of support;Decreased dorsiflexion - right;Decreased dorsiflexion - left;Decreased stride  length;Leaning posteriorly         Stairs            Wheelchair Mobility    Modified Rankin (Stroke Patients Only)       Balance Overall balance assessment: Needs assistance Sitting-balance support: No upper extremity supported;Feet supported Sitting balance-Leahy Scale: Poor     Standing balance support: Bilateral upper extremity supported Standing balance-Leahy Scale: Poor Standing balance comment: posterior lean with RW                            Cognition Arousal/Alertness: Awake/alert Behavior During Therapy: Flat affect Overall Cognitive Status: History of cognitive impairments - at baseline Area of Impairment: Orientation                 Orientation Level: Person Current Attention Level: Focused   Following Commands: Follows one step commands consistently Safety/Judgement: Decreased awareness of safety;Decreased awareness of deficits   Problem Solving: Requires verbal cues;Difficulty sequencing;Decreased initiation;Slow processing;Requires tactile cues General Comments: pt reported "i am not tired, I'm playing basketball"; pt given Ativan prior to therapist's arrival      Exercises      General Comments General comments (skin integrity, edema, etc.): VSS throughout session. Cues for base of support and RW use during gait, pt unable to adapt gait for further than 1-2 steps      Pertinent Vitals/Pain Pain Assessment: No/denies pain    Home Living                      Prior Function  PT Goals (current goals can now be found in the care plan section) Acute Rehab PT Goals Patient Stated Goal: no goats stated PT Goal Formulation: With patient Time For Goal Achievement: 09/22/17 Potential to Achieve Goals: Fair Progress towards PT goals: Progressing toward goals    Frequency    Min 2X/week      PT Plan Current plan remains appropriate    Co-evaluation              AM-PAC PT "6 Clicks" Daily  Activity  Outcome Measure  Difficulty turning over in bed (including adjusting bedclothes, sheets and blankets)?: A Lot Difficulty moving from lying on back to sitting on the side of the bed? : A Lot Difficulty sitting down on and standing up from a chair with arms (e.g., wheelchair, bedside commode, etc,.)?: Unable Help needed moving to and from a bed to chair (including a wheelchair)?: A Lot Help needed walking in hospital room?: A Lot Help needed climbing 3-5 steps with a railing? : A Lot 6 Click Score: 11    End of Session Equipment Utilized During Treatment: Gait belt Activity Tolerance: Patient tolerated treatment well     PT Visit Diagnosis: Unsteadiness on feet (R26.81);Repeated falls (R29.6);Muscle weakness (generalized) (M62.81);Difficulty in walking, not elsewhere classified (R26.2)     Time: 6222-9798 PT Time Calculation (min) (ACUTE ONLY): 17 min  Charges:  $Gait Training: 8-22 mins                    G Codes:       Reinaldo Berber, PT, DPT Acute Rehab Services Pager: [phone]     Reinaldo Berber 09/17/2017, 1:18 PM

## 2017-09-17 NOTE — Progress Notes (Addendum)
Chucky May discharged per MD order.   VSS, Skin clean, dry and intact without evidence of skin break down, no evidence of skin tears noted.  IV catheter discontinued intact. Site without signs and symptoms of complications. Dressing and pressure applied.  An After Visit Summary was printed and given to the [facility].  Patient is going to [facility].    Attempted to call report and got transferred to answering machine.  Left message with return number.  Will try to call again.

## 2017-09-17 NOTE — Clinical Social Work Placement (Signed)
Nurse to call report to [phone], Room [place]  NOTE  Date:  09/17/2017  Patient Details  Name: Evan Charles MRN: [id number] Date of Birth: 04/04/1946  Clinical Social Work is seeking post-discharge placement for this patient at the [place] level of care (*CSW will initial, date and re-position this form in  chart as items are completed):  Yes   Patient/family provided with [place] Work Department's list of facilities offering this level of care within the geographic area requested by the patient (or if unable, by the patient's family).  Yes   Patient/family informed of their freedom to choose among providers that offer the needed level of care, that participate in Medicare, Medicaid or managed care program needed by the patient, have an available bed and are willing to accept the patient.  Yes   Patient/family informed of [place]'s ownership interest in [facility] and [facility], as well as of the fact that they are under no obligation to receive care at these facilities.  PASRR submitted to EDS on 09/12/17     PASRR number received on       Existing PASRR number confirmed on 09/12/17     FL2 transmitted to all facilities in geographic area requested by pt/family on       FL2 transmitted to all facilities within larger geographic area on 09/12/17     Patient informed that his/her managed care company has contracts with or will negotiate with certain facilities, including the following:        Yes   Patient/family informed of bed offers received.  Patient chooses bed at [facility]     Physician recommends and patient chooses bed at      Patient to be transferred to [facility] on 09/17/17.  Patient to be transferred to facility by PTAR     Patient family notified on 09/17/17 of transfer.  Name of family member notified:   [name]     PHYSICIAN       Additional Comment:    _______________________________________________ Geralynn Ochs, LCSW 09/17/2017, 1:22 PM

## 2017-09-17 NOTE — Progress Notes (Signed)
PTAR here to transport patient to [facility].  VS taken and B/P noted at 86/65.  PTAR unable to transport.  Contacted MD Broadus John with values and reported that patient is asymptomatic and has been walking the unit today with mobility tech and PT.  MD Broadus John requests to check vitals again at 4 and transport then if possible.  Will continue to monitor.

## 2017-09-17 NOTE — Progress Notes (Signed)
VS rechecked and B/P at 100/61.  Reported to MD Broadus John.   Will call PTAR to transport when possible.  Will continue to monitor patient.

## 2017-09-17 NOTE — Discharge Summary (Signed)
Physician Discharge Summary  Evan Charles YPP:[id number] DOB: 01/24/1946 DOA: 09/06/2017  PCP: [facility]  Admit date: 09/06/2017 Discharge date: 09/17/2017  Time spent: 45 minutes  Recommendations for Outpatient Follow-up:  1. Discharged to SNF for short-term rehabilitation 2. PCP at [facility] in 1-2 weeks   Discharge Diagnoses:    Clostridium perfringens bacteremia secondary to contamination   Dementia   GERD (gastroesophageal reflux disease)   Frequent falls   Metabolic encephalopathy   Dehydration, moderate   FTT (failure to thrive) in adult   HTN (hypertension)   Discharge Condition: stable  Diet recommendation: heart healthy  Filed Weights   09/15/17 0433 09/16/17 0401 09/17/17 0534  Weight: 74.8 kg (164 lb 14.5 oz) 72.1 kg (159 lb) 73.1 kg (161 lb 4.1 oz)    History of present illness:  Evan Mccravy Austinis a 71 y.o.malewith medical history significant for dementia failure to thrive and physical deconditioning, history of prostate cancer, GERD, hypertension and spinal stenosis. The patient was recently discharged on 10/24 after an admission for rhabdomyolysis, during that previous admission he was noted with altered mental status and was evaluated by psychiatry who determined that the patient did not have decision-making capacity. SNFwas advised but apparently the patient refused to go to nursing facility because he wanted to go home and did not want to spend the money required to go to a nursing facility. Unfortunately there is no guardianship in place and no family or POA available, sent home with home health services and Adult Protective Services following after discharge. Returned back to the ER on 10/25 after [employer] went to perform a welfare check and found the patient on the floor, unclothed laying against his wheelchair and he had been incontinent of urine and/or stool. The police officers subsequently dressed the patient and called EMS. No acute issues  were found in the ER and he did not meet requirements for admission. Social work was consulted to assist with nursing facility placement but the patient once again refused and left the ER AMA. He returned back to the ER on 10/26 after once again falling.CSW was consulted for Dispo options, while this was ongoing, patient had 1 of 2 blood cultures returned positive for GNR's although the BCID demonstrated no organisms, admitted 10/27 and started Abx    Hospital Course:   1/2  Clostridium perfringens bacteremia-consistent with contamination -Pt had blood cultures drawn 10/26 in the emergency room for mild leukocytosis, one out of 2 grew Clostridium perfringens, with negative BCR ID - in the absence of fever, leukocytosis or any clinical evidence of sepsis or active infection I suspect this is a contamination. Called and discussed case with infectious disease on-call Dr. Baxter Flattery who agreed, he was on IV ceftriaxone which I stopped 11/1 -Urine cultures negative -No evidence of fevers or leukocytosis -remains stable, off Abx   Dementia -patient does not have capacity to make decisions on his behalf and unfortunately does not have a power of attorney -Psychiatry consulted, agrees that patient does not have capacity  -CSW had been working on placement, APS involved in case -is alert and awake, appropriate for the most part, intermittently confused -Physically has significant deconditioning and weakness, finally agreed to short-term rehabilitation and daughter picked a short-term rehabilitation facility -continue Seroquel per home regimen   GERD (gastroesophageal reflux disease) - stable on ppi    Frequent falls/gait disorder/debility/spinal stenosis - Physical therapy following, SNF recommended    Dehydration, moderate - resolved after rehydration    FTT (failure  to thrive) in adult    HTN (hypertension) -stable on amlodipine, metoprolol    Consultations:  D/w  ID  Psychiatry  Discharge Exam: Vitals:   09/16/17 2043 09/17/17 0534  BP: 128/73 110/73  Pulse: 97 84  Resp:  16  Temp: 98.1 F (36.7 C) 98.2 F (36.8 C)  SpO2: 97% 98%    General: AAOx3 Cardiovascular: S1s2/RRR Respiratory: CTAB  Discharge Instructions   Discharge Instructions    Diet - low sodium heart healthy   Complete by:  As directed    Increase activity slowly   Complete by:  As directed      Current Discharge Medication List    START taking these medications   Details  amLODipine (NORVASC) 10 MG tablet Take 1 tablet (10 mg total) daily by mouth.      CONTINUE these medications which have CHANGED   Details  aspirin 325 MG tablet Take 1 tablet (325 mg total) daily by mouth.    traMADol (ULTRAM) 50 MG tablet Take 1 tablet (50 mg total) every 6 (six) hours as needed by mouth (for pain). Qty: 30 tablet, Refills: 0      CONTINUE these medications which have NOT CHANGED   Details  Cholecalciferol 1000 units capsule Take 1,000 Units by mouth daily.    QUEtiapine (SEROQUEL) 50 MG tablet Take 1 tablet (50 mg total) by mouth at bedtime. Qty: 30 tablet, Refills: 0    divalproex (DEPAKOTE) 250 MG DR tablet Take 1 tablet (250 mg total) by mouth at bedtime. Qty: 30 tablet, Refills: 0    docusate sodium (COLACE) 100 MG capsule Take 1 capsule (100 mg total) by mouth 2 (two) times daily as needed for moderate constipation.    metoprolol tartrate (LOPRESSOR) 25 MG tablet Take 1 tablet (25 mg total) by mouth 2 (two) times daily. Qty: 60 tablet, Refills: 0    senna (SENOKOT) 8.6 MG TABS tablet Take 1 tablet (8.6 mg total) by mouth at bedtime. Qty: 120 each, Refills: 0      STOP taking these medications     hydrochlorothiazide (MICROZIDE) 12.5 MG capsule      potassium chloride (K-DUR) 10 MEQ tablet        Allergies  Allergen Reactions  . Tomato Hives and Itching    Contact information for follow-up providers    Schedule an appointment as soon as  possible for a visit  with [facility]   Contact information: [place] [address] [phone]            Contact information for after-discharge care    Destination    [facility] .   Service:  Skilled Nursing Contact information: [address] [place] [phone]                   The results of significant diagnostics from this hospitalization (including imaging, microbiology, ancillary and laboratory) are listed below for reference.    Significant Diagnostic Studies: Dg Chest 2 View  Result Date: 09/06/2017 CLINICAL DATA:  Cough and confusion. EXAM: CHEST  2 VIEW COMPARISON:  05/31/2017 FINDINGS: The heart size and mediastinal contours are within normal limits. Stable mild elevation of the left hemidiaphragm. There is no evidence of pulmonary edema, consolidation, pneumothorax, nodule or pleural fluid. The visualized skeletal structures are unremarkable. IMPRESSION: No active cardiopulmonary disease. Electronically Signed   By: Aletta Edouard M.D.   On: 09/06/2017 17:08   Ct  Head Wo Contrast  Result Date: 09/06/2017 CLINICAL DATA:  [place] out of a wheelchair and hit his head today. EXAM: CT HEAD WITHOUT CONTRAST TECHNIQUE: Contiguous axial images were obtained from the base of the skull through the vertex without intravenous contrast. COMPARISON:  08/27/2017. FINDINGS: Brain: Diffusely enlarged ventricles and subarachnoid spaces. Patchy white matter low density in both cerebral hemispheres. No intracranial hemorrhage, mass lesion or CT evidence of acute infarction. Vascular: No hyperdense vessel or unexpected calcification. Skull: Normal. Negative for fracture or focal lesion. Sinuses/Orbits: Small right maxillary sinus retention cysts. Small left anterior ethmoid sinus with retention cysts. Unremarkable orbits. Other: Minimal left posterior parietal scalp hematoma.  IMPRESSION: 1. Minimal left posterior parietal scalp hematoma. 2. No skull fracture or intracranial hemorrhage. 3. Stable mild diffuse cerebral and cerebellar atrophy and minimal chronic small vessel white matter ischemic changes in both cerebral hemispheres. Electronically Signed   By: Claudie Revering M.D.   On: 09/06/2017 17:02   Ct Head Wo Contrast  Result Date: 08/27/2017 CLINICAL DATA:  Headache after fall yesterday.  Initial encounter. EXAM: CT HEAD WITHOUT CONTRAST TECHNIQUE: Contiguous axial images were obtained from the base of the skull through the vertex without intravenous contrast. COMPARISON:  06/07/2017 MRI FINDINGS: Brain: Mild involutional changes of brain, consistent with patient age. No acute intracranial hemorrhage, midline shift or edema. Mild small vessel ischemic disease of periventricular and subcortical white matter. No intra-axial mass nor extra-axial collections. Vascular: No hyperdense vessels. No mild atherosclerosis of carotid siphons bilaterally. Skull: No skull fracture. Sinuses/Orbits: No acute sinus disease. Intact orbits and globes. Clear mastoids. Other: None IMPRESSION: Chronic appearing small vessel ischemic disease. No acute intracranial abnormality. Electronically Signed   By: Ashley Royalty M.D.   On: 08/27/2017 22:44   Dg Shoulder Left  Result Date: 08/27/2017 CLINICAL DATA:  Fall with diffuse shoulder pain EXAM: LEFT SHOULDER - 2+ VIEW COMPARISON:  05/02/2017 FINDINGS: No fracture or dislocation. The [facility] joint appears intact. Punctate calcification at the left humeral head. Probable elevated left diaphragm. IMPRESSION: 1. No acute osseous abnormality 2. Small amount of calcific tendinitis Electronically Signed   By: Donavan Foil M.D.   On: 08/27/2017 15:35    Microbiology: No results found for this or any previous visit (from the past 240 hour(s)).   Labs: Basic Metabolic Panel: Recent Labs  Lab 09/14/17 0545  CREATININE 0.83   Liver Function Tests: No  results for input(s): AST, ALT, ALKPHOS, BILITOT, PROT, ALBUMIN in the last 168 hours. No results for input(s): LIPASE, AMYLASE in the last 168 hours. No results for input(s): AMMONIA in the last 168 hours. CBC: No results for input(s): WBC, NEUTROABS, HGB, HCT, MCV, PLT in the last 168 hours. Cardiac Enzymes: No results for input(s): CKTOTAL, CKMB, CKMBINDEX, TROPONINI in the last 168 hours. BNP: BNP (last 3 results) Recent Labs    04/28/17 1231 05/29/17 1411 05/31/17 1742  BNP 7.8 10.7 17.3    ProBNP (last 3 results) No results for input(s): PROBNP in the last 8760 hours.  CBG: No results for input(s): GLUCAP in the last 168 hours.     SignedDomenic Polite MD.  [facility] 09/17/2017, 11:16 AM

## 2017-09-18 DIAGNOSIS — M48061 Spinal stenosis, lumbar region without neurogenic claudication: Secondary | ICD-10-CM | POA: Diagnosis not present

## 2017-09-18 DIAGNOSIS — R627 Adult failure to thrive: Secondary | ICD-10-CM | POA: Diagnosis not present

## 2017-09-18 DIAGNOSIS — R296 Repeated falls: Secondary | ICD-10-CM | POA: Diagnosis not present

## 2017-09-18 DIAGNOSIS — B967 Clostridium perfringens [C. perfringens] as the cause of diseases classified elsewhere: Secondary | ICD-10-CM | POA: Diagnosis not present

## 2017-09-25 DIAGNOSIS — I10 Essential (primary) hypertension: Secondary | ICD-10-CM | POA: Diagnosis not present

## 2017-09-25 DIAGNOSIS — R296 Repeated falls: Secondary | ICD-10-CM | POA: Diagnosis not present

## 2017-09-25 DIAGNOSIS — M48061 Spinal stenosis, lumbar region without neurogenic claudication: Secondary | ICD-10-CM | POA: Diagnosis not present

## 2017-09-30 DIAGNOSIS — I10 Essential (primary) hypertension: Secondary | ICD-10-CM | POA: Diagnosis not present

## 2017-09-30 DIAGNOSIS — R627 Adult failure to thrive: Secondary | ICD-10-CM | POA: Diagnosis not present

## 2017-09-30 DIAGNOSIS — R296 Repeated falls: Secondary | ICD-10-CM | POA: Diagnosis not present

## 2017-09-30 DIAGNOSIS — M48061 Spinal stenosis, lumbar region without neurogenic claudication: Secondary | ICD-10-CM | POA: Diagnosis not present

## 2017-10-04 ENCOUNTER — Emergency Department (HOSPITAL_COMMUNITY): Payer: Medicare Other

## 2017-10-04 ENCOUNTER — Other Ambulatory Visit: Payer: Self-pay

## 2017-10-04 ENCOUNTER — Inpatient Hospital Stay (HOSPITAL_COMMUNITY)
Admission: EM | Admit: 2017-10-04 | Discharge: 2017-10-25 | DRG: 884 | Disposition: A | Discharge status: Skilled Nursing Facility | Payer: Medicare Other | Attending: Internal Medicine | Admitting: Internal Medicine

## 2017-10-04 DIAGNOSIS — Z8673 Personal history of transient ischemic attack (TIA), and cerebral infarction without residual deficits: Secondary | ICD-10-CM | POA: Diagnosis not present

## 2017-10-04 DIAGNOSIS — R633 Feeding difficulties: Secondary | ICD-10-CM | POA: Diagnosis present

## 2017-10-04 DIAGNOSIS — G629 Polyneuropathy, unspecified: Secondary | ICD-10-CM | POA: Diagnosis present

## 2017-10-04 DIAGNOSIS — I10 Essential (primary) hypertension: Secondary | ICD-10-CM | POA: Diagnosis not present

## 2017-10-04 DIAGNOSIS — G8929 Other chronic pain: Secondary | ICD-10-CM | POA: Diagnosis present

## 2017-10-04 DIAGNOSIS — R451 Restlessness and agitation: Secondary | ICD-10-CM | POA: Diagnosis present

## 2017-10-04 DIAGNOSIS — G9340 Encephalopathy, unspecified: Secondary | ICD-10-CM | POA: Diagnosis present

## 2017-10-04 DIAGNOSIS — F0391 Unspecified dementia with behavioral disturbance: Secondary | ICD-10-CM

## 2017-10-04 DIAGNOSIS — R5381 Other malaise: Secondary | ICD-10-CM | POA: Diagnosis not present

## 2017-10-04 DIAGNOSIS — Z91018 Allergy to other foods: Secondary | ICD-10-CM

## 2017-10-04 DIAGNOSIS — Z87891 Personal history of nicotine dependence: Secondary | ICD-10-CM | POA: Diagnosis not present

## 2017-10-04 DIAGNOSIS — Y92009 Unspecified place in unspecified non-institutional (private) residence as the place of occurrence of the external cause: Secondary | ICD-10-CM

## 2017-10-04 DIAGNOSIS — R627 Adult failure to thrive: Secondary | ICD-10-CM | POA: Diagnosis not present

## 2017-10-04 DIAGNOSIS — R402414 Glasgow coma scale score 13-15, 24 hours or more after hospital admission: Secondary | ICD-10-CM | POA: Diagnosis not present

## 2017-10-04 DIAGNOSIS — G3183 Dementia with Lewy bodies: Secondary | ICD-10-CM | POA: Diagnosis present

## 2017-10-04 DIAGNOSIS — Z008 Encounter for other general examination: Secondary | ICD-10-CM

## 2017-10-04 DIAGNOSIS — F0281 Dementia in other diseases classified elsewhere with behavioral disturbance: Secondary | ICD-10-CM | POA: Diagnosis present

## 2017-10-04 DIAGNOSIS — R443 Hallucinations, unspecified: Secondary | ICD-10-CM | POA: Diagnosis not present

## 2017-10-04 DIAGNOSIS — R4182 Altered mental status, unspecified: Secondary | ICD-10-CM | POA: Diagnosis not present

## 2017-10-04 DIAGNOSIS — W19XXXA Unspecified fall, initial encounter: Secondary | ICD-10-CM | POA: Diagnosis present

## 2017-10-04 DIAGNOSIS — R251 Tremor, unspecified: Secondary | ICD-10-CM | POA: Diagnosis not present

## 2017-10-04 DIAGNOSIS — M549 Dorsalgia, unspecified: Secondary | ICD-10-CM | POA: Diagnosis present

## 2017-10-04 DIAGNOSIS — Z591 Inadequate housing: Secondary | ICD-10-CM

## 2017-10-04 DIAGNOSIS — F0390 Unspecified dementia without behavioral disturbance: Secondary | ICD-10-CM | POA: Diagnosis present

## 2017-10-04 DIAGNOSIS — Z653 Problems related to other legal circumstances: Secondary | ICD-10-CM

## 2017-10-04 DIAGNOSIS — Z79899 Other long term (current) drug therapy: Secondary | ICD-10-CM | POA: Diagnosis not present

## 2017-10-04 DIAGNOSIS — R410 Disorientation, unspecified: Secondary | ICD-10-CM | POA: Diagnosis not present

## 2017-10-04 DIAGNOSIS — Z7982 Long term (current) use of aspirin: Secondary | ICD-10-CM | POA: Diagnosis not present

## 2017-10-04 DIAGNOSIS — R4020 Unspecified coma: Secondary | ICD-10-CM | POA: Diagnosis not present

## 2017-10-04 DIAGNOSIS — R4189 Other symptoms and signs involving cognitive functions and awareness: Secondary | ICD-10-CM | POA: Diagnosis present

## 2017-10-04 DIAGNOSIS — R0600 Dyspnea, unspecified: Secondary | ICD-10-CM

## 2017-10-04 DIAGNOSIS — R441 Visual hallucinations: Secondary | ICD-10-CM | POA: Diagnosis present

## 2017-10-04 DIAGNOSIS — I4510 Unspecified right bundle-branch block: Secondary | ICD-10-CM | POA: Diagnosis present

## 2017-10-04 DIAGNOSIS — I639 Cerebral infarction, unspecified: Secondary | ICD-10-CM | POA: Diagnosis present

## 2017-10-04 DIAGNOSIS — M4800 Spinal stenosis, site unspecified: Secondary | ICD-10-CM | POA: Diagnosis present

## 2017-10-04 DIAGNOSIS — F0630 Mood disorder due to known physiological condition, unspecified: Secondary | ICD-10-CM | POA: Diagnosis present

## 2017-10-04 DIAGNOSIS — F0151 Vascular dementia with behavioral disturbance: Secondary | ICD-10-CM | POA: Diagnosis not present

## 2017-10-04 DIAGNOSIS — F0392 Unspecified dementia, unspecified severity, with psychotic disturbance: Secondary | ICD-10-CM | POA: Diagnosis present

## 2017-10-04 DIAGNOSIS — I6789 Other cerebrovascular disease: Secondary | ICD-10-CM | POA: Diagnosis not present

## 2017-10-04 DIAGNOSIS — F29 Unspecified psychosis not due to a substance or known physiological condition: Secondary | ICD-10-CM | POA: Diagnosis present

## 2017-10-04 DIAGNOSIS — R413 Other amnesia: Secondary | ICD-10-CM | POA: Diagnosis not present

## 2017-10-04 LAB — CBC
HEMATOCRIT: 41.2 % (ref 39.0–52.0)
HEMOGLOBIN: 13.6 g/dL (ref 13.0–17.0)
MCH: 29.2 pg (ref 26.0–34.0)
MCHC: 33 g/dL (ref 30.0–36.0)
MCV: 88.6 fL (ref 78.0–100.0)
Platelets: 285 10*3/uL (ref 150–400)
RBC: 4.65 MIL/uL (ref 4.22–5.81)
RDW: 14.8 % (ref 11.5–15.5)
WBC: 6.1 10*3/uL (ref 4.0–10.5)

## 2017-10-04 LAB — URINALYSIS, ROUTINE W REFLEX MICROSCOPIC
BACTERIA UA: NONE SEEN
BILIRUBIN URINE: NEGATIVE
Glucose, UA: NEGATIVE mg/dL
HGB URINE DIPSTICK: NEGATIVE
KETONES UR: 80 mg/dL — AB
LEUKOCYTES UA: NEGATIVE
NITRITE: NEGATIVE
PROTEIN: NEGATIVE mg/dL
Specific Gravity, Urine: 1.024 (ref 1.005–1.030)
pH: 5 (ref 5.0–8.0)

## 2017-10-04 LAB — COMPREHENSIVE METABOLIC PANEL
ALT: 13 U/L — ABNORMAL LOW (ref 17–63)
ANION GAP: 11 (ref 5–15)
AST: 25 U/L (ref 15–41)
Albumin: 4.3 g/dL (ref 3.5–5.0)
Alkaline Phosphatase: 51 U/L (ref 38–126)
BUN: 23 mg/dL — AB (ref 6–20)
CHLORIDE: 107 mmol/L (ref 101–111)
CO2: 25 mmol/L (ref 22–32)
Calcium: 9.4 mg/dL (ref 8.9–10.3)
Creatinine, Ser: 0.86 mg/dL (ref 0.61–1.24)
GFR calc Af Amer: >60 mL/min (ref >60)
Glucose, Bld: 91 mg/dL (ref 65–99)
POTASSIUM: 4 mmol/L (ref 3.5–5.1)
Sodium: 143 mmol/L (ref 135–145)
Total Bilirubin: 0.9 mg/dL (ref 0.3–1.2)
Total Protein: 7.9 g/dL (ref 6.5–8.1)

## 2017-10-04 LAB — AMMONIA: AMMONIA: 15 umol/L (ref 9–35)

## 2017-10-04 LAB — LIPASE, BLOOD: LIPASE: 23 U/L (ref 11–51)

## 2017-10-04 MED ORDER — LORAZEPAM 2 MG/ML IJ SOLN: 0.5000 mg | Freq: Once | INTRAMUSCULAR | Status: DC

## 2017-10-04 MED ORDER — SODIUM CHLORIDE 0.9 % IV BOLUS (SEPSIS)
500.0000 mL | Freq: Once | INTRAVENOUS | Status: AC
  Administered 2017-10-04: 500 mL via INTRAVENOUS

## 2017-10-04 MED ORDER — LORAZEPAM 2 MG/ML IJ SOLN
0.5000 mg | Freq: Once | INTRAMUSCULAR | Status: AC
  Administered 2017-10-04: 0.5 mg via INTRAVENOUS
  Filled 2017-10-04: qty 1

## 2017-10-04 NOTE — ED Notes (Signed)
Not enough stool for a sample. Pt hasn't used his condom cath.

## 2017-10-04 NOTE — ED Notes (Signed)
Bed: BX03 Expected date:  Expected time:  Means of arrival:  Comments: Evan Charles

## 2017-10-04 NOTE — Progress Notes (Addendum)
CSW called APS and filed a report with DSS agent Carney Harder. APS is aware pt is D/C'ing and report will be reviewed by DSS.  CSW called pt's daughterand left HIPPA-compliant VN and called pt's nephew and to make him aware of pt's D/C and pt's nephew stated he is aware and will check on the pt in the morning at the pt's home.  Please reconsult if future social work needs arise.  CSW signing off, as social work intervention is no longer needed.  Alphonse Guild. Aahan Marques, LCSW, LCAS, CSI Clinical Social Worker Ph: [phone]

## 2017-10-04 NOTE — Care Management Note (Signed)
Case Management Note  CM consulted.  Pt is very well-known to CM.  Advised by Evan Mylar, PA that pt is more confused than normal and AOx1.  CM spoke with pt who is continuing to refuse facility placement.  He was AOx3 with this CM.  Disoriented to time and pt acknowledged he doesn't have a reason to keep up with a calender.  Pt states when the time is right he will be willing to go to a facility but that time is not now.  He thinks of his monthly check as retirement savings.  He states he wants to be able to leave something for his daughters and grandchildren.  Through conversation pt feels that safety in regards to self-care is "neither here nor there" and feels safe returning home.  CM is able to tell through conversaion the pt is still confused but clear on his wishes. Updated all providers.  CM is unable to assist pt with anything further at this time.  CSW has been consulted as well and has called APS.  Advised Dansie, PA not to admit for TLC placement, that pt has used all of SNF rehab days, and willingly left facility.    CM spoke with Dr. Reynaldo Charles in regards to pt who will speak with Evan Charles and Evan Charles , Evan Charles.  Per Dr Evan Charles, the only way pt can be placed in a LTC facilty is APS has to get guardianship or he has to willingly sign over his check to a LTC facility.    No further CM needs noted at this time.

## 2017-10-04 NOTE — Progress Notes (Addendum)
Consult request has been received. CSW attempting to follow up at present time.  Pt is well-known to this CSW due to the pt's multiple stays at the [facility] ED. Pt recently was D/C'd from the medical floor at [facility] and D/C'd to [facility] on 11/6 and left on 11/20 and his last covered day was the 11/18.  Per Althea Charon pt privately paid from the [facility] and then left of his own accord. CSW also talked to Wells Guiles in admission at [employer] who is calling the [place] to see if the pt, a veteran is connected with either [place], [place] or [place]. Wells Guiles is also checking to see if pt has definitely "used up" all of his insurance days for this month yet.  Per notes pt had a APS worker and this CSW has called and a message was left Jacarri Gesner [phone].  4:16 PM Wells Guiles called and [facility] is closed today, but Wells Guiles is following up under the [facility] Medicare website now to determine pt's insurance status.  4:29 PM Wells Guiles from [employer] stated [facility] is closed.  Previous note from 10/25 below:   10/25: Patient found at home after welfare check. APS worker has been called and message left Andree Golphin [phone].  This Probation officer is very familiar with case (please see most recent discharge post 36 hours ago) and notes.  Barrier with placement: Patient is his own guardian and refusing to give up money to pay for placement. He was worked up for SNF, but has no payer source thus no bed offers or facilities would offer to him. APS is involved, did not feel the need to petition for guardianship, thus he was discharged him with APS to follow up along with home health. Pt reports that he gets meals on wheels and is able to feed himself and also eats mostly canned foods at home. Pt reports that he wants to go to a nursing home in 2 years. He states that he can't go until in 2 years because he has to finish paying his house off.  LCSW cannot  pursue placement until APS has applied for guardianship and has control over finances to force payment to care for patient.  APS will continue to follow in the community. Case remains open.   LCSW will follow up with APS once called returned. All records sent to APS at time of discharge.  APS is aware of current situation and patient's inability to care for self.  Family involved, but not willing to accept responsibility for taking control of assets/decisions. 2:45 PM 10/25 Spoke with Dr. Reynaldo Minium regarding case. Recommendations: If [facility] agency is not wiling to accept patient back for community services, then patient discharges without home health. Patient does not meet criteria for skilled home health need. Patient has a right to make bad decisions. Will defer case to APS, who is familiar with patient and will follow up in the community. RN CM was with LCSW at time of call and clarification of needs and next steps.  DC home.  CSW will continue to follow.  Alphonse Guild. Urvi Imes, LCSW, LCAS, CSI Clinical Social Worker Ph: [phone]

## 2017-10-04 NOTE — ED Triage Notes (Addendum)
Per EMS, Pt from home c/o diarrhea x 2  Weeks. Pt lives alone without aid and was found in his own feces in horrible living conditions. Unable to feed himself or perform basic ADLs. Suggesting consult to social work based on report from EMS. Hx of dementia. Alert, but only oriented to self.

## 2017-10-04 NOTE — ED Notes (Signed)
Patient wearing condom cath

## 2017-10-04 NOTE — ED Notes (Signed)
Bed: GS81 Expected date:  Expected time:  Means of arrival:  Comments: 71 yr old, diarrhea

## 2017-10-04 NOTE — ED Provider Notes (Signed)
[place] DEPT Provider Note   CSN: [id number] Arrival date & time: 10/04/17  1415     History   Chief Complaint Chief Complaint  Patient presents with  . Diarrhea    HPI Evan Charles is a 71 y.o. male.  Level V Caveat: Dementia   Evan Charles is a 71 y.o. Male who presents to the ED via EMS after being found laying in his feces at his home today. According to EMS personnel the patient's nephew checked on him today.  Apparently the nephew checks on him intermittently.  He was found laying in his feces in a home that was very disheveled.  Is clear according to EMS personnel, that he is not been taking care of himself. He has a history of dementia. RN staff who have cared for him before report that he is usually not this demented. Patient tells me he has not been feeling very well lately, but cannot give me further details about what is going on or if he has pain anywhere in particular. He denies any specific complaints when asked.    The history is provided by the patient, medical records and the EMS personnel. No language interpreter was used.  Diarrhea      Past Medical History:  Diagnosis Date  . Bilateral lower extremity edema 04/2017  . Chronic mental illness    Seroquel for intermittent hallucinations, psychiatry states he has capacity  . Colitis   . Dementia   . GERD (gastroesophageal reflux disease)   . Hypertension   . Neuropathy    " MY HANDS "  . Prostate cancer ([place])   . Rhabdomyolysis 09/08/2016  . Spinal stenosis    DDD , no neurogenic claudication  . UTI (urinary tract infection) 04/2017    Patient Active Problem List   Diagnosis Date Noted  . Gram-negative bacteremia 09/07/2017  . Dementia 09/07/2017  . GERD (gastroesophageal reflux disease) 09/07/2017  . Frequent falls 09/07/2017  . Metabolic encephalopathy 76/22/6333  . Dehydration, moderate 09/07/2017  . FTT (failure to thrive) in adult 09/07/2017  .  HTN (hypertension) 09/07/2017  . Prediabetes 06/08/2017  . 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 Dystonia 06/07/2017  . Weakness 06/06/2017  . Accelerated hypertension 06/06/2017  . Leg weakness 04/28/2017  . 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 Impaired mobility and ADLs   . Hypokalemia 11/05/2016  . Dementia with psychosis 09/12/2016  . Acute delirium 09/08/2016  . Normocytic anemia 09/08/2016  . Acute metabolic encephalopathy 54/56/2563  . Rhabdomyolysis 09/08/2016  . Fall at home   . Fall 07/25/2016  . Mild cognitive impairment 07/25/2016  . Immobility 03/20/2016  . Generalized weakness 03/20/2016  . Failure to thrive in adult 03/20/2016  . Neuropathy   . Essential hypertension   . Physical deconditioning   . 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 GERD without esophagitis 12/19/2015  . Bilateral lower extremity edema 12/19/2015  . Intractable low back pain 02/14/2015  . Difficulty walking 02/14/2015  . H/O: upper GI bleed   . Visual hallucinations   . Psychoses [facility])     Past Surgical History:  Procedure Laterality Date  . KNEE ARTHROSCOPY    . LAMINECTOMY    . PROSTATECTOMY         Home Medications    Prior to Admission medications   Medication Sig Start Date End Date Taking? Authorizing Provider  amLODipine (  NORVASC) 10 MG tablet Take 1 tablet (10 mg total) daily by mouth. 09/18/17  Yes Domenic Polite, MD  Cholecalciferol 1000 units capsule Take 1,000 Units by mouth daily.   Yes [provider]  divalproex (DEPAKOTE) 250 MG DR tablet Take 1 tablet (250 mg total) by mouth at bedtime. 07/16/17  Yes Hendricks Limes, MD  furosemide (LASIX) 80 MG tablet Take 80 mg by mouth daily.   Yes [provider]  metoprolol tartrate (LOPRESSOR) 25 MG tablet Take 1 tablet (25 mg total) by mouth 2 (two) times daily. 07/16/17  Yes Hendricks Limes, MD    QUEtiapine (SEROQUEL) 50 MG tablet Take 1 tablet (50 mg total) by mouth at bedtime. 07/16/17  Yes Hendricks Limes, MD  aspirin 325 MG tablet Take 1 tablet (325 mg total) daily by mouth. 09/17/17   Domenic Polite, MD  docusate sodium (COLACE) 100 MG capsule Take 1 capsule (100 mg total) by mouth 2 (two) times daily as needed for moderate constipation. Patient not taking: Reported on 10/04/2017 04/30/17   Zada Finders, MD  hydrochlorothiazide (MICROZIDE) 12.5 MG capsule Take 12.5 mg by mouth daily.  07/16/17   [provider]  KLOR-CON 10 10 MEQ tablet Take 10 mEq by mouth daily.  08/29/17   [provider]  senna (SENOKOT) 8.6 MG TABS tablet Take 1 tablet (8.6 mg total) by mouth at bedtime. Patient not taking: Reported on 10/04/2017 06/11/17   Rama, Venetia Maxon, MD  traMADol (ULTRAM) 50 MG tablet Take 1 tablet (50 mg total) every 6 (six) hours as needed by mouth (for pain). 09/17/17   Domenic Polite, MD    Family History Family History  Problem Relation Age of Onset  . Cancer Brother   . Cancer Maternal Grandmother     Social History Social History   Tobacco Use  . Smoking status: Former Smoker    Types: Cigarettes  . Smokeless tobacco: Never Used  Substance Use Topics  . Alcohol use: No  . Drug use: No     Allergies   Tomato   Review of Systems Review of Systems  Unable to perform ROS: Dementia  Gastrointestinal: Positive for diarrhea.     Physical Exam Updated Vital Signs BP 111/75 (BP Location: Right Arm)   Pulse (!) 103   Temp 98 F (36.7 C) (Oral)   Resp 18   SpO2 100%   Physical Exam  Constitutional: He appears well-developed and well-nourished. No distress.  HENT:  Head: Normocephalic and atraumatic.  Mouth/Throat: Oropharynx is clear and moist.  Eyes: Conjunctivae are normal. Pupils are equal, round, and reactive to light. Right eye exhibits no discharge. Left eye exhibits no discharge.  Neck: Neck supple.  Cardiovascular: Normal rate,  regular rhythm, normal heart sounds and intact distal pulses.  Pulmonary/Chest: Effort normal and breath sounds normal. No stridor. No respiratory distress. He has no wheezes. He has no rales.  Abdominal: Soft. Bowel sounds are normal. He exhibits no distension. There is no tenderness. There is no guarding.  Musculoskeletal: Normal range of motion. He exhibits no deformity.  Patient is spontaneously moving all extremities in a coordinated fashion exhibiting good strength.   Lymphadenopathy:    He has no cervical adenopathy.  Neurological: He is alert. Coordination normal.  Patient is alert and oriented to person only. His she is clear and coherent.  No facial droop.  He follows all commands appropriately.  He is demented and does not answer questions with appropriate answers.  He tells  me he has not been enjoying the same which is that he has been eating.  He also talks about living in [place]. No focal weakness noted.   Skin: Skin is warm and dry. Capillary refill takes less than 2 seconds. No rash noted. He is not diaphoretic. No erythema. No pallor.  Psychiatric: He has a normal mood and affect. His behavior is normal.  Nursing note and vitals reviewed.    ED Treatments / Results  Labs (all labs ordered are listed, but only abnormal results are displayed) Labs Reviewed  COMPREHENSIVE METABOLIC PANEL - Abnormal; Notable for the following components:      Result Value   BUN 23 (*)    ALT 13 (*)    All other components within normal limits  URINALYSIS, ROUTINE W REFLEX MICROSCOPIC - Abnormal; Notable for the following components:   Ketones, ur 80 (*)    Squamous Epithelial / LPF 0-5 (*)    All other components within normal limits  GASTROINTESTINAL PANEL BY PCR, STOOL (REPLACES STOOL CULTURE)  LIPASE, BLOOD  CBC  AMMONIA  CK    EKG  EKG Interpretation  Date/Time:  Friday October 04 2017 16:00:58 EST Ventricular Rate:  91 PR Interval:    QRS Duration: 152 QT  Interval:  392 QTC Calculation: 483 R Axis:   98 Text Interpretation:  Sinus rhythm RBBB and LPFB No significant change since last tracing Confirmed by Dorie Rank [id number]) on 10/04/2017 4:08:34 PM Also confirmed by Dorie Rank [id number]), editor Lynder Parents [phone])  on 10/04/2017 4:19:51 PM       Radiology Ct Head Wo Contrast  Result Date: 10/04/2017 CLINICAL DATA:  Altered level of consciousness. History of dementia. EXAM: CT HEAD WITHOUT CONTRAST TECHNIQUE: Contiguous axial images were obtained from the base of the skull through the vertex without intravenous contrast. COMPARISON:  09/06/2017 FINDINGS: Brain: There is no evidence of acute infarct, intracranial hemorrhage, mass, midline shift, or extra-axial fluid collection. Mild generalized cerebral atrophy is again noted. Periventricular white matter hypodensities are unchanged and nonspecific but compatible with mild chronic small vessel ischemic disease. Vascular: Mild calcified atherosclerosis at the skullbase. No hyperdense vessel. Skull: No fracture or focal osseous lesion. Sinuses/Orbits: The visualized paranasal sinuses and mastoid air cells are clear. Unremarkable orbits. Other: None. IMPRESSION: 1. No evidence of acute intracranial abnormality. 2. Mild chronic small vessel ischemic disease. Electronically Signed   By: Logan Bores M.D.   On: 10/04/2017 18:32    Procedures Procedures (including critical care time)  Medications Ordered in ED Medications  sodium chloride 0.9 % bolus 500 mL (0 mLs Intravenous Stopped 10/04/17 1707)  sodium chloride 0.9 % bolus 500 mL (500 mLs Intravenous New Bag/Given 10/04/17 2230)  LORazepam (ATIVAN) injection 0.5 mg (0.5 mg Intravenous Given 10/04/17 2330)     Initial Impression / Assessment and Plan / ED Course  I have reviewed the triage vital signs and the nursing notes.  Pertinent labs & imaging results that were available during my care of the patient were reviewed by me and considered  in my medical decision making (see chart for details).    This is a 71 y.o. Male who presents to the ED via EMS after being found laying in his feces at his home today. According to EMS personnel the patient's nephew checked on him today.  Apparently the nephew checks on him intermittently.  He was found laying in his feces in a home that was very disheveled.  Is clear according to EMS  personnel, that he is not been taking care of himself. He has a history of dementia. RN staff who have cared for him before report that he is usually not this demented. Patient tells me he has not been feeling very well lately, but cannot give me further details about what is going on or if he has pain anywhere in particular. He denies any specific complaints when asked.  On exam the patient is afebrile and non-toxic appearing. No visible signs of injury or trauma. He is alert and oriented to person only during my exam.  His speech seems somewhat tangential. He talks about eating sandwiches and then about living in [place] and about his fingers getting in the way all in one sentence. This was his response when I asked if anything was bothering him. His speech is clear. No facial droop. No focal weakness noted. He follows commands.  Blood work here is unremarkable.  CT scan of his head is also unremarkable. Social work and care management were consulted to help with placement with this patient.  They report they know him extensively and report that he seems to go back and forth from being oriented to not.  Social worker reports that he is oriented x3 on her evaluation. I also spoke with Dr. Reynaldo Minium who reports psychiatry says he does not have functional capacity, but APS will not say he does not have legal capacity. It is APS that evaluates for legal capacity and they know this patient well. He also reports he has been placed in facility before and he walks out.  During ED stay I never saw the patient oriented more than to  person only. Nursing staff report he has been combative and stating that he is at his house and swinging at staff. He is oriented to person only. Charge nurse tells me she does not feel comfortable discharging this patient. Dr. Tomi Bamberger and I agree. He is certainly only oriented to person during my evaluation and clearly has not been caring for himself. APS certainly needs to be involved. Question psychiatric illness as well due to his tangential speech. Likely needs long term placement. He is delirious and will admit to medicine. I spoke with [facility] hospitalist service who accepted the patient for admission.   This patient was discussed with and evaluated by Dr. Tomi Bamberger who agrees with assessment and plan.   Final Clinical Impressions(s) / ED Diagnoses   Final diagnoses:  Delirium  Dementia with behavioral disturbance, unspecified dementia type  Dirty living conditions    ED Discharge Orders    None       Sharmaine Base 10/05/17 Alvira Philips, MD 10/05/17 [phone]

## 2017-10-05 ENCOUNTER — Observation Stay (HOSPITAL_COMMUNITY): Payer: Medicare Other

## 2017-10-05 DIAGNOSIS — G9340 Encephalopathy, unspecified: Secondary | ICD-10-CM | POA: Diagnosis present

## 2017-10-05 DIAGNOSIS — Z87891 Personal history of nicotine dependence: Secondary | ICD-10-CM | POA: Diagnosis not present

## 2017-10-05 DIAGNOSIS — Z91018 Allergy to other foods: Secondary | ICD-10-CM | POA: Diagnosis not present

## 2017-10-05 DIAGNOSIS — R410 Disorientation, unspecified: Secondary | ICD-10-CM | POA: Diagnosis not present

## 2017-10-05 DIAGNOSIS — G629 Polyneuropathy, unspecified: Secondary | ICD-10-CM | POA: Diagnosis present

## 2017-10-05 DIAGNOSIS — I4510 Unspecified right bundle-branch block: Secondary | ICD-10-CM | POA: Diagnosis present

## 2017-10-05 DIAGNOSIS — R633 Feeding difficulties: Secondary | ICD-10-CM | POA: Diagnosis present

## 2017-10-05 DIAGNOSIS — F0281 Dementia in other diseases classified elsewhere with behavioral disturbance: Secondary | ICD-10-CM | POA: Diagnosis present

## 2017-10-05 DIAGNOSIS — R262 Difficulty in walking, not elsewhere classified: Secondary | ICD-10-CM | POA: Diagnosis not present

## 2017-10-05 DIAGNOSIS — R451 Restlessness and agitation: Secondary | ICD-10-CM | POA: Diagnosis present

## 2017-10-05 DIAGNOSIS — F0630 Mood disorder due to known physiological condition, unspecified: Secondary | ICD-10-CM | POA: Diagnosis present

## 2017-10-05 DIAGNOSIS — R278 Other lack of coordination: Secondary | ICD-10-CM | POA: Diagnosis not present

## 2017-10-05 DIAGNOSIS — G8929 Other chronic pain: Secondary | ICD-10-CM | POA: Diagnosis present

## 2017-10-05 DIAGNOSIS — I10 Essential (primary) hypertension: Secondary | ICD-10-CM | POA: Diagnosis not present

## 2017-10-05 DIAGNOSIS — M2681 Anterior soft tissue impingement: Secondary | ICD-10-CM | POA: Diagnosis not present

## 2017-10-05 DIAGNOSIS — R296 Repeated falls: Secondary | ICD-10-CM | POA: Diagnosis not present

## 2017-10-05 DIAGNOSIS — R441 Visual hallucinations: Secondary | ICD-10-CM | POA: Diagnosis present

## 2017-10-05 DIAGNOSIS — R413 Other amnesia: Secondary | ICD-10-CM | POA: Diagnosis not present

## 2017-10-05 DIAGNOSIS — M549 Dorsalgia, unspecified: Secondary | ICD-10-CM | POA: Diagnosis present

## 2017-10-05 DIAGNOSIS — G3183 Dementia with Lewy bodies: Secondary | ICD-10-CM | POA: Diagnosis present

## 2017-10-05 DIAGNOSIS — F0150 Vascular dementia without behavioral disturbance: Secondary | ICD-10-CM | POA: Diagnosis not present

## 2017-10-05 DIAGNOSIS — R443 Hallucinations, unspecified: Secondary | ICD-10-CM | POA: Diagnosis not present

## 2017-10-05 DIAGNOSIS — F0391 Unspecified dementia with behavioral disturbance: Secondary | ICD-10-CM

## 2017-10-05 DIAGNOSIS — Z7982 Long term (current) use of aspirin: Secondary | ICD-10-CM | POA: Diagnosis not present

## 2017-10-05 DIAGNOSIS — Z653 Problems related to other legal circumstances: Secondary | ICD-10-CM | POA: Diagnosis not present

## 2017-10-05 DIAGNOSIS — Z79899 Other long term (current) drug therapy: Secondary | ICD-10-CM | POA: Diagnosis not present

## 2017-10-05 DIAGNOSIS — Y92009 Unspecified place in unspecified non-institutional (private) residence as the place of occurrence of the external cause: Secondary | ICD-10-CM | POA: Diagnosis not present

## 2017-10-05 DIAGNOSIS — Z008 Encounter for other general examination: Secondary | ICD-10-CM | POA: Diagnosis not present

## 2017-10-05 DIAGNOSIS — M48061 Spinal stenosis, lumbar region without neurogenic claudication: Secondary | ICD-10-CM | POA: Diagnosis not present

## 2017-10-05 DIAGNOSIS — W19XXXA Unspecified fall, initial encounter: Secondary | ICD-10-CM | POA: Diagnosis present

## 2017-10-05 DIAGNOSIS — F0151 Vascular dementia with behavioral disturbance: Secondary | ICD-10-CM | POA: Diagnosis present

## 2017-10-05 DIAGNOSIS — R0600 Dyspnea, unspecified: Secondary | ICD-10-CM | POA: Diagnosis not present

## 2017-10-05 DIAGNOSIS — Z8673 Personal history of transient ischemic attack (TIA), and cerebral infarction without residual deficits: Secondary | ICD-10-CM | POA: Diagnosis not present

## 2017-10-05 DIAGNOSIS — R4182 Altered mental status, unspecified: Secondary | ICD-10-CM | POA: Diagnosis not present

## 2017-10-05 DIAGNOSIS — R627 Adult failure to thrive: Secondary | ICD-10-CM | POA: Diagnosis not present

## 2017-10-05 DIAGNOSIS — R402414 Glasgow coma scale score 13-15, 24 hours or more after hospital admission: Secondary | ICD-10-CM | POA: Diagnosis not present

## 2017-10-05 DIAGNOSIS — M4800 Spinal stenosis, site unspecified: Secondary | ICD-10-CM | POA: Diagnosis present

## 2017-10-05 DIAGNOSIS — R4189 Other symptoms and signs involving cognitive functions and awareness: Secondary | ICD-10-CM | POA: Diagnosis present

## 2017-10-05 DIAGNOSIS — M6281 Muscle weakness (generalized): Secondary | ICD-10-CM | POA: Diagnosis not present

## 2017-10-05 DIAGNOSIS — R5381 Other malaise: Secondary | ICD-10-CM | POA: Diagnosis not present

## 2017-10-05 LAB — CREATININE, SERUM: CREATININE: 0.85 mg/dL (ref 0.61–1.24)

## 2017-10-05 LAB — CBC
HEMATOCRIT: 38.5 % — AB (ref 39.0–52.0)
HEMOGLOBIN: 12.5 g/dL — AB (ref 13.0–17.0)
MCH: 29.1 pg (ref 26.0–34.0)
MCHC: 32.5 g/dL (ref 30.0–36.0)
MCV: 89.7 fL (ref 78.0–100.0)
Platelets: 244 10*3/uL (ref 150–400)
RBC: 4.29 MIL/uL (ref 4.22–5.81)
RDW: 15 % (ref 11.5–15.5)
WBC: 6.2 10*3/uL (ref 4.0–10.5)

## 2017-10-05 MED ORDER — ASPIRIN EC 81 MG PO TBEC
81.0000 mg | DELAYED_RELEASE_TABLET | Freq: Every day | ORAL | Status: DC
  Administered 2017-10-07 – 2017-10-25 (×19): 81 mg via ORAL
  Filled 2017-10-05 (×20): qty 1

## 2017-10-05 MED ORDER — FUROSEMIDE 40 MG PO TABS
80.0000 mg | ORAL_TABLET | Freq: Every day | ORAL | Status: DC
  Administered 2017-10-07 – 2017-10-14 (×8): 80 mg via ORAL
  Filled 2017-10-05 (×10): qty 2

## 2017-10-05 MED ORDER — QUETIAPINE FUMARATE 50 MG PO TABS
50.0000 mg | ORAL_TABLET | Freq: Every day | ORAL | Status: DC
  Administered 2017-10-05 – 2017-10-24 (×20): 50 mg via ORAL
  Filled 2017-10-05 (×20): qty 1

## 2017-10-05 MED ORDER — SENNA 8.6 MG PO TABS
1.0000 | ORAL_TABLET | Freq: Every day | ORAL | Status: DC
  Administered 2017-10-06 – 2017-10-24 (×19): 8.6 mg via ORAL
  Filled 2017-10-05 (×19): qty 1

## 2017-10-05 MED ORDER — IPRATROPIUM-ALBUTEROL 0.5-2.5 (3) MG/3ML IN SOLN
3.0000 mL | Freq: Four times a day (QID) | RESPIRATORY_TRACT | Status: DC
  Administered 2017-10-05: 3 mL via RESPIRATORY_TRACT
  Filled 2017-10-05: qty 3

## 2017-10-05 MED ORDER — AMLODIPINE BESYLATE 10 MG PO TABS
10.0000 mg | ORAL_TABLET | Freq: Every day | ORAL | Status: DC
  Administered 2017-10-07 – 2017-10-11 (×4): 10 mg via ORAL
  Filled 2017-10-05 (×6): qty 1

## 2017-10-05 MED ORDER — IPRATROPIUM-ALBUTEROL 0.5-2.5 (3) MG/3ML IN SOLN: 3.0000 mL | RESPIRATORY_TRACT | Status: DC | PRN

## 2017-10-05 MED ORDER — LORAZEPAM 2 MG/ML IJ SOLN
0.5000 mg | Freq: Four times a day (QID) | INTRAMUSCULAR | Status: DC | PRN
  Administered 2017-10-05 – 2017-10-25 (×7): 0.5 mg via INTRAVENOUS
  Filled 2017-10-05 (×9): qty 1

## 2017-10-05 MED ORDER — DIVALPROEX SODIUM 250 MG PO DR TAB
250.0000 mg | DELAYED_RELEASE_TABLET | Freq: Every day | ORAL | Status: DC
  Administered 2017-10-06 – 2017-10-24 (×19): 250 mg via ORAL
  Filled 2017-10-05 (×19): qty 1

## 2017-10-05 MED ORDER — ENOXAPARIN SODIUM 40 MG/0.4ML ~~LOC~~ SOLN
40.0000 mg | SUBCUTANEOUS | Status: DC
  Administered 2017-10-05 – 2017-10-25 (×20): 40 mg via SUBCUTANEOUS
  Filled 2017-10-05 (×21): qty 0.4

## 2017-10-05 MED ORDER — HYDROCHLOROTHIAZIDE 12.5 MG PO CAPS
12.5000 mg | ORAL_CAPSULE | Freq: Every day | ORAL | Status: DC
  Administered 2017-10-07 – 2017-10-11 (×5): 12.5 mg via ORAL
  Filled 2017-10-05 (×5): qty 1

## 2017-10-05 MED ORDER — METOPROLOL TARTRATE 25 MG PO TABS
25.0000 mg | ORAL_TABLET | Freq: Two times a day (BID) | ORAL | Status: DC
  Administered 2017-10-05 – 2017-10-25 (×38): 25 mg via ORAL
  Filled 2017-10-05 (×39): qty 1

## 2017-10-05 MED ORDER — FOLIC ACID 1 MG PO TABS
1.0000 mg | ORAL_TABLET | Freq: Every day | ORAL | Status: DC
  Administered 2017-10-07 – 2017-10-25 (×19): 1 mg via ORAL
  Filled 2017-10-05 (×20): qty 1

## 2017-10-05 MED ORDER — THIAMINE HCL 100 MG/ML IJ SOLN
100.0000 mg | Freq: Every day | INTRAMUSCULAR | Status: DC
  Administered 2017-10-05 – 2017-10-09 (×5): 100 mg via INTRAVENOUS
  Filled 2017-10-05 (×5): qty 2

## 2017-10-05 NOTE — Progress Notes (Signed)
[name], new admission to room 1345 is confused at this time. This patient with medical history significant of dementia is a poor historian. Unable at this time to complete H&P of Evan Charles. Will continue to monitor and assess this patient needs for comfort and safety.

## 2017-10-05 NOTE — H&P (Signed)
History and Physical    Evan Charles XNA:[id number] DOB: 1946/04/24 DOA: 10/04/2017  PCP: [facility]  Patient coming from: home   Chief Complaint: family concern  HPI: Evan Charles is a 71 y.o. male with medical history significant of dementia, cva, anemia, failure to thrive, who is brought via ems after being found by nephew who went to check on him. Found down at home incoherent, sitting in own feces. Multiple recent hospitalizations for similar episodes. Once found to have rhabdomyolysis. At recent hospitalizations discharge to SNF recommended but apparently patient refuses, though does also appear he has been found to not be competent and has been referred to APS.   Patient examined after being given ativan and while did respond to my voice by moving did not wake up. I attempted to call the nephew who called EMS but no answer.  ED Course: labs, CT head, ativan, fluids  Review of Systems: As per HPI otherwise 10 point review of systems negative.    Past Medical History:  Diagnosis Date  . Bilateral lower extremity edema 04/2017  . Chronic mental illness    Seroquel for intermittent hallucinations, psychiatry states he has capacity  . Colitis   . Dementia   . GERD (gastroesophageal reflux disease)   . Hypertension   . Neuropathy    " MY HANDS "  . Prostate cancer ([place])   . Rhabdomyolysis 09/08/2016  . Spinal stenosis    DDD , no neurogenic claudication  . UTI (urinary tract infection) 04/2017    Past Surgical History:  Procedure Laterality Date  . KNEE ARTHROSCOPY    . LAMINECTOMY    . PROSTATECTOMY       reports that he has quit smoking. His smoking use included cigarettes. he has never used smokeless tobacco. He reports that he does not drink alcohol or use drugs.  Allergies  Allergen Reactions  . Tomato Hives and Itching    Family History  Problem Relation Age of Onset  . Cancer Brother   . Cancer Maternal Grandmother     Prior to  Admission medications   Medication Sig Start Date End Date Taking? Authorizing Provider  amLODipine (NORVASC) 10 MG tablet Take 1 tablet (10 mg total) daily by mouth. 09/18/17  Yes Domenic Polite, MD  Cholecalciferol 1000 units capsule Take 1,000 Units by mouth daily.   Yes [provider]  divalproex (DEPAKOTE) 250 MG DR tablet Take 1 tablet (250 mg total) by mouth at bedtime. 07/16/17  Yes Hendricks Limes, MD  furosemide (LASIX) 80 MG tablet Take 80 mg by mouth daily.   Yes [provider]  metoprolol tartrate (LOPRESSOR) 25 MG tablet Take 1 tablet (25 mg total) by mouth 2 (two) times daily. 07/16/17  Yes Hendricks Limes, MD  QUEtiapine (SEROQUEL) 50 MG tablet Take 1 tablet (50 mg total) by mouth at bedtime. 07/16/17  Yes Hendricks Limes, MD  aspirin 325 MG tablet Take 1 tablet (325 mg total) daily by mouth. 09/17/17   Domenic Polite, MD  docusate sodium (COLACE) 100 MG capsule Take 1 capsule (100 mg total) by mouth 2 (two) times daily as needed for moderate constipation. Patient not taking: Reported on 10/04/2017 04/30/17   Zada Finders, MD  hydrochlorothiazide (MICROZIDE) 12.5 MG capsule Take 12.5 mg by mouth daily.  07/16/17   [provider]  KLOR-CON 10 10 MEQ tablet Take 10 mEq by mouth daily.  08/29/17   [provider]  senna (SENOKOT) 8.6 MG  TABS tablet Take 1 tablet (8.6 mg total) by mouth at bedtime. Patient not taking: Reported on 10/04/2017 06/11/17   Rama, Venetia Maxon, MD  traMADol (ULTRAM) 50 MG tablet Take 1 tablet (50 mg total) every 6 (six) hours as needed by mouth (for pain). 09/17/17   Domenic Polite, MD    Physical Exam: Vitals:   10/04/17 1645 10/04/17 1700 10/04/17 1924 10/04/17 2225  BP:  137/86 (!) 143/109 111/75  Pulse: (!) 110 95 (!) 102 (!) 103  Resp: (!) 22 18 18 18   Temp:   97.9 F (36.6 C) 98 F (36.7 C)  TempSrc:   Oral Oral  SpO2: 99% 98% 98% 100%    Constitutional: NAD, calm, comfortable Vitals:   10/04/17 1645  10/04/17 1700 10/04/17 1924 10/04/17 2225  BP:  137/86 (!) 143/109 111/75  Pulse: (!) 110 95 (!) 102 (!) 103  Resp: (!) 22 18 18 18   Temp:   97.9 F (36.6 C) 98 F (36.7 C)  TempSrc:   Oral Oral  SpO2: 99% 98% 98% 100%   General: asleep ENMT: Mucous membranes are moist.  Neck: normal, supple,  Respiratory: clear to auscultation bilaterally, no wheezing, no crackles. Normal respiratory effort. No accessory muscle use.  Cardiovascular: Regular rate and rhythm, no murmurs / rubs / gallops. Moderate LE pitting edema.. 2+ pedal pulses. No carotid bruits.  Abdomen: no tenderness, no masses palpated. No hepatosplenomegaly. Bowel sounds positive.  Musculoskeletal: no clubbing / cyanosis.  Skin: no rashes, lesions, ulcers. No induration Neurologic: responds to loud noise, does not wake up   Labs on Admission: I have personally reviewed following labs and imaging studies  CBC: Recent Labs  Lab 10/04/17 1455  WBC 6.1  HGB 13.6  HCT 41.2  MCV 88.6  PLT 109   Basic Metabolic Panel: Recent Labs  Lab 10/04/17 1455  NA 143  K 4.0  CL 107  CO2 25  GLUCOSE 91  BUN 23*  CREATININE 0.86  CALCIUM 9.4   GFR: CrCl cannot be calculated (Unknown ideal weight.). Liver Function Tests: Recent Labs  Lab 10/04/17 1455  AST 25  ALT 13*  ALKPHOS 51  BILITOT 0.9  PROT 7.9  ALBUMIN 4.3   Recent Labs  Lab 10/04/17 1455  LIPASE 23   Recent Labs  Lab 10/04/17 1820  AMMONIA 15   Coagulation Profile: No results for input(s): INR, PROTIME in the last 168 hours. Cardiac Enzymes: No results for input(s): CKTOTAL, CKMB, CKMBINDEX, TROPONINI in the last 168 hours. BNP (last 3 results) No results for input(s): PROBNP in the last 8760 hours. HbA1C: No results for input(s): HGBA1C in the last 72 hours. CBG: No results for input(s): GLUCAP in the last 168 hours. Lipid Profile: No results for input(s): CHOL, HDL, LDLCALC, TRIG, CHOLHDL, LDLDIRECT in the last 72 hours. Thyroid Function  Tests: No results for input(s): TSH, T4TOTAL, FREET4, T3FREE, THYROIDAB in the last 72 hours. Anemia Panel: No results for input(s): VITAMINB12, FOLATE, FERRITIN, TIBC, IRON, RETICCTPCT in the last 72 hours. Urine analysis:    Component Value Date/Time   COLORURINE YELLOW 10/04/2017 2145   APPEARANCEUR CLEAR 10/04/2017 2145   LABSPEC 1.024 10/04/2017 2145   PHURINE 5.0 10/04/2017 2145   GLUCOSEU NEGATIVE 10/04/2017 2145   HGBUR NEGATIVE 10/04/2017 2145   BILIRUBINUR NEGATIVE 10/04/2017 2145   KETONESUR 80 (A) 10/04/2017 2145   PROTEINUR NEGATIVE 10/04/2017 2145   UROBILINOGEN 0.2 02/13/2015 1957   NITRITE NEGATIVE 10/04/2017 2145   LEUKOCYTESUR NEGATIVE 10/04/2017 2145  Radiological Exams on Admission: Ct Head Wo Contrast  Result Date: 10/04/2017 CLINICAL DATA:  Altered level of consciousness. History of dementia. EXAM: CT HEAD WITHOUT CONTRAST TECHNIQUE: Contiguous axial images were obtained from the base of the skull through the vertex without intravenous contrast. COMPARISON:  09/06/2017 FINDINGS: Brain: There is no evidence of acute infarct, intracranial hemorrhage, mass, midline shift, or extra-axial fluid collection. Mild generalized cerebral atrophy is again noted. Periventricular white matter hypodensities are unchanged and nonspecific but compatible with mild chronic small vessel ischemic disease. Vascular: Mild calcified atherosclerosis at the skullbase. No hyperdense vessel. Skull: No fracture or focal osseous lesion. Sinuses/Orbits: The visualized paranasal sinuses and mastoid air cells are clear. Unremarkable orbits. Other: None. IMPRESSION: 1. No evidence of acute intracranial abnormality. 2. Mild chronic small vessel ischemic disease. Electronically Signed   By: Logan Bores M.D.   On: 10/04/2017 18:32    EKG: Independently reviewed. RBB  Assessment/Plan Active Problems:   Psychoses ([place])   Visual hallucinations   Failure to thrive in adult   Essential  hypertension   Physical deconditioning   Dementia with psychosis   Stroke (cerebrum) ([place])   Dementia  # Dementia - appears to lack capacity and is unable to care for self at home. Unable to adequately question, but per what I can gather patient had no specific complaints. CT head unremarkable, CBC wnl, CMP wnl, UA not suggestive of infection. - will require SW assistance for placement, does not appear safe to return to community - continue sitter - continue depakote and seroquel for now - f/u [facility] (recent admit for rhabdo, though cr wnl and so is K) - holding on IV fluids as kidney function appears at baseline and increased IV lines contribute to encephalopathy - have ordered thiamine/folate, though does not appear to have a recent alcohol history  # HTN  - continue home hctz, metoprolol, amlodipine, lasix   DVT prophylaxis: lovenox  Code Status: full (need to confirm with family when able)  Family Communication: main contact appears to be nephew, contact information in pt demographics  Disposition Plan: likely snf Consults called: none Admission status: med/surg obs    Desma Maxim MD [facility] Pager [phone]  If 7PM-7AM, please contact night-coverage [email or web address] Password [email or web address]  10/05/2017, 1:15 AM
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
## 2017-10-06 DIAGNOSIS — R5381 Other malaise: Secondary | ICD-10-CM

## 2017-10-06 NOTE — Progress Notes (Signed)
PROGRESS NOTE  Evan Charles HYW:[id number] DOB: 01-Nov-1946 DOA: 10/04/2017 [facility]: [facility]   LOS: 1 day   Brief Narrative / Interim history: Evan Charles is a 71 y.o. male with medical history significant of dementia, cva, anemia, failure to thrive, who is brought via ems after being found by nephew who went to check on him. Found down at home incoherent, sitting in own feces. Multiple recent hospitalizations for similar episodes. Once found to have rhabdomyolysis. At recent hospitalizations discharge to SNF recommended but apparently patient refuses, though does also appear he has been found to not be competent and has been referred to APS.   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[place])   Visual hallucinations   Failure to thrive in adult   Essential hypertension   Physical deconditioning   Dementia with psychosis   Stroke (cerebrum) ([facility])   Dementia   Dementia  -patient with dementia unable to live at home by himself in unsafe conditions, admission he was noncommunicative and aggressive towards these MD as well as nursing staff -Bit improved today, he is talking  -SW consulted -not sure how he left AMA from SNF if he has no capacity -No clear precipitant for his mental status changes on admission, chest x-ray without clear-cut infiltrate, UA is normal, he is afebrile and his white count is normal  HTN -continue Norvasc, Lasix, HCTZ, Metoprolol, intermittently refusing p.o. meds  Agitation -continue seroquel, ativan PRN   FTT (failure to thrive) in adult  Prior CVA -continue Aspirin    DVT prophylaxis: Lovenox Code Status: Full code Family Communication: Family at bedside, tried calling nephew unsuccessfully Disposition Plan: TBD  Consultants:   none  Procedures:   None   Antimicrobials:  None    Subjective: -Confused, does not answer questions directly  Objective: Vitals:   10/05/17 1330 10/05/17 2018 10/06/17 0557  10/06/17 1049  BP: (!) 129/93 131/82 129/83 (!) 122/107  Pulse: 76 95 92 95  Resp: 20 18 16 17   Temp: 97.7 F (36.5 C) 98.1 F (36.7 C) 98.5 F (36.9 C)   TempSrc: Axillary Axillary Axillary   SpO2: 100% 99% 100% 97%  Weight:      Height:        Intake/Output Summary (Last 24 hours) at 10/06/2017 1138 Last data filed at 10/06/2017 1100 Gross per 24 hour  Intake 290 ml  Output 400 ml  Net -110 ml   Filed Weights   10/05/17 0217  Weight: 80.2 kg (176 lb 12.9 oz)    Examination:  Constitutional: NAD Respiratory: clear to auscultation bilaterally, no wheezing, no crackles.  Cardiovascular: Regular rate and rhythm, no murmurs / rubs / gallops. No LE edema.  Abdomen: no tenderness. Bowel sounds positive.   Data Reviewed: I have independently reviewed following labs and imaging studies   CBC: Recent Labs  Lab 10/04/17 1455 10/05/17 0404  WBC 6.1 6.2  HGB 13.6 12.5*  HCT 41.2 38.5*  MCV 88.6 89.7  PLT 285 485   Basic Metabolic Panel: Recent Labs  Lab 10/04/17 1455 10/05/17 0404  NA 143  --   K 4.0  --   CL 107  --   CO2 25  --   GLUCOSE 91  --   BUN 23*  --   CREATININE 0.86 0.85  CALCIUM 9.4  --    GFR: Estimated Creatinine Clearance: 79.7 mL/min (by C-G formula based on SCr of 0.85 mg/dL). Liver Function Tests: Recent Labs  Lab 10/04/17 1455  AST 25  ALT 13*  ALKPHOS 51  BILITOT 0.9  PROT 7.9  ALBUMIN 4.3   Recent Labs  Lab 10/04/17 1455  LIPASE 23   Recent Labs  Lab 10/04/17 1820  AMMONIA 15   Coagulation Profile: No results for input(s): INR, PROTIME in the last 168 hours. Cardiac Enzymes: No results for input(s): CKTOTAL, CKMB, CKMBINDEX, TROPONINI in the last 168 hours. BNP (last 3 results) No results for input(s): PROBNP in the last 8760 hours. HbA1C: No results for input(s): HGBA1C in the last 72 hours. CBG: No results for input(s): GLUCAP in the last 168 hours. Lipid Profile: No results for input(s): CHOL, HDL, LDLCALC,  TRIG, CHOLHDL, LDLDIRECT in the last 72 hours. Thyroid Function Tests: No results for input(s): TSH, T4TOTAL, FREET4, T3FREE, THYROIDAB in the last 72 hours. Anemia Panel: No results for input(s): VITAMINB12, FOLATE, FERRITIN, TIBC, IRON, RETICCTPCT in the last 72 hours. Urine analysis:    Component Value Date/Time   COLORURINE YELLOW 10/04/2017 2145   APPEARANCEUR CLEAR 10/04/2017 2145   LABSPEC 1.024 10/04/2017 2145   PHURINE 5.0 10/04/2017 2145   GLUCOSEU NEGATIVE 10/04/2017 2145   HGBUR NEGATIVE 10/04/2017 2145   BILIRUBINUR NEGATIVE 10/04/2017 2145   KETONESUR 80 (A) 10/04/2017 2145   PROTEINUR NEGATIVE 10/04/2017 2145   UROBILINOGEN 0.2 02/13/2015 1957   NITRITE NEGATIVE 10/04/2017 2145   LEUKOCYTESUR NEGATIVE 10/04/2017 2145   Sepsis Labs: Invalid input(s): PROCALCITONIN, LACTICIDVEN  No results found for this or any previous visit (from the past 240 hour(s)).    Radiology Studies: Ct Head Wo Contrast  Result Date: 10/04/2017 CLINICAL DATA:  Altered level of consciousness. History of dementia. EXAM: CT HEAD WITHOUT CONTRAST TECHNIQUE: Contiguous axial images were obtained from the base of the skull through the vertex without intravenous contrast. COMPARISON:  09/06/2017 FINDINGS: Brain: There is no evidence of acute infarct, intracranial hemorrhage, mass, midline shift, or extra-axial fluid collection. Mild generalized cerebral atrophy is again noted. Periventricular white matter hypodensities are unchanged and nonspecific but compatible with mild chronic small vessel ischemic disease. Vascular: Mild calcified atherosclerosis at the skullbase. No hyperdense vessel. Skull: No fracture or focal osseous lesion. Sinuses/Orbits: The visualized paranasal sinuses and mastoid air cells are clear. Unremarkable orbits. Other: None. IMPRESSION: 1. No evidence of acute intracranial abnormality. 2. Mild chronic small vessel ischemic disease. Electronically Signed   By: Logan Bores M.D.    On: 10/04/2017 18:32   Dg Chest Port 1 View  Result Date: 10/05/2017 CLINICAL DATA:  Dyspnea. EXAM: PORTABLE CHEST 1 VIEW COMPARISON:  Radiographs of September 06, 2017. FINDINGS: The heart size and mediastinal contours are within normal limits. No pneumothorax is noted. Right lung is clear. Mild left basilar atelectasis or inflammation is noted. No significant pleural effusion is noted. The visualized skeletal structures are unremarkable. IMPRESSION: Mild left basilar subsegmental atelectasis or inflammation. Electronically Signed   By: Marijo Conception, M.D.   On: 10/05/2017 10:08     Scheduled Meds: . amLODipine  10 mg Oral Daily  . aspirin EC  81 mg Oral Daily  . divalproex  250 mg Oral QHS  . enoxaparin (LOVENOX) injection  40 mg Subcutaneous Q24H  . folic acid  1 mg Oral Daily  . furosemide  80 mg Oral Daily  . hydrochlorothiazide  12.5 mg Oral Daily  . metoprolol tartrate  25 mg Oral BID  . QUEtiapine  50 mg Oral QHS  . senna  1 tablet Oral QHS  . thiamine injection  100 mg  Intravenous Daily   Continuous Infusions:  Marzetta Board, MD, PhD [facility] Pager [phone]  If 7PM-7AM, please contact night-coverage [email or web address] Password [facility] 10/06/2017, 11:38 AM

## 2017-10-06 NOTE — Progress Notes (Signed)
CSW consulted to assist with disposition. Pt very well known to [facility] and CM team - multiple past admissions and ED visits under similar circumstances. During pt's last encounter he was followed by APS, but APS was not pursuing guardianship. CSW left voicemail for APS worker [phone].   CSW noted that the SNF pt was last in, [facility], was attempting to contact [facility] for assistance, however it was reported during previous encounter pt does not have service benefits that would assist in placement (see notes from previous encounter). CSW left voicemail with [facility] in attempt to investigate if this report is still accurate.   Pt has refused SNF placemet due to "wanting to finish paying off his home." Has daughter and nephew who "check on him" but cannot provide 24/7 supervision or care.   Pt has had multiple evaluations for capacity in the last few months with differing results. Most recent 09/11/17 at [facility] determined he does not have capacity to make medical decisions.   Attempted to assess pt today- pt sleeping. Will follow up with full assessment and continue gathering information.  Sharren Bridge, MSW, LCSW Clinical Social Work 10/06/2017 [phone]

## 2017-10-06 NOTE — Progress Notes (Signed)
Pt trying to get out of bed a few times, he's been confused, mumbling a lot,not making any sense. Low bed and pads on the floor, with telemonitor  in use for the pt. Pt oozing soft stool.

## 2017-10-06 NOTE — Progress Notes (Signed)
Dr. Cruzita Lederer texted about pt not taking medicines and his BP 122/107. No orders given.

## 2017-10-07 ENCOUNTER — Other Ambulatory Visit: Payer: Self-pay

## 2017-10-07 ENCOUNTER — Encounter (HOSPITAL_COMMUNITY): Payer: Self-pay

## 2017-10-07 MED ORDER — ENSURE ENLIVE PO LIQD
237.0000 mL | Freq: Two times a day (BID) | ORAL | Status: DC
  Administered 2017-10-07 – 2017-10-25 (×23): 237 mL via ORAL

## 2017-10-07 MED ORDER — HALOPERIDOL LACTATE 5 MG/ML IJ SOLN: 2.0000 mg | Freq: Four times a day (QID) | INTRAMUSCULAR | Status: DC | PRN

## 2017-10-07 NOTE — Plan of Care (Signed)
Pt ate some ice cream this shift.

## 2017-10-07 NOTE — Plan of Care (Signed)
  Nutrition: Adequate nutrition will be maintained 10/07/2017 2300 - Completed/Met by Mickie Kay, RN

## 2017-10-07 NOTE — Progress Notes (Signed)
PROGRESS NOTE    Evan Charles  YQM:[id number] DOB: 1946/10/14 DOA: 10/04/2017 PCP: [facility]   Brief Narrative:  Evan Charles a 71 y.o.malewith medical history significant ofdementia, cva, anemia, failure to thrive, who is brought via ems after being found by nephew who went to check on him. Found down at home incoherent, sitting in own feces. Multiple recent hospitalizations for similar episodes. Once found to have rhabdomyolysis. At recent hospitalizations discharge to SNF recommended but apparently patient refuses, though does also appear he has been found to not be competent and has been referred to APS.   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  Per social work, APS currently not pursuing guardianship, but may pending more information about pt's current mental status (per CM, they have been unable to justify petitioning for guardianship up until know). -No clear precipitant for his mental status changes on admission, head CT without acute change, chest x-ray without clear-cut infiltrate, UA is normal, he is afebrile and his white count is normal  HTN -continue Norvasc, Lasix, HCTZ, Metoprolol, intermittently refusing p.o. meds  Agitation -continue seroquel, ativan PRN  FTT (failure to thrive) in adult  Prior CVA -continue Aspirin   DVT prophylaxis: lovenox Code Status: full  Family Communication:  none at bedside Disposition Plan: pending safe discharge plan   Consultants:   Psychiatry  Procedures: (Don't include imaging studies which can be auto populated. Include things that cannot be auto populated i.e. Echo, Carotid and venous dopplers, Foley, Bipap, HD, tubes/drains, wound vac, central lines etc)  none  Antimicrobials: (specify start and planned stop date. Auto populated tables are space occupying and do not give end dates)  none    Subjective: Feels terrible. Feels like he's lost his dignity. A&Ox1, notes he's here b/c of rproblems with supplements.   Objective: Vitals:   10/06/17 1049 10/06/17 1351 10/06/17 2240 10/07/17 0636  BP: (!) 122/107 (!) 137/94 (!) 110/96 120/79  Pulse: 95 100 99 76  Resp: 17 16 18 14   Temp:  98.6 F (37 C) 98.5 F (36.9 C) 97.7 F (36.5 C)  TempSrc:  Axillary Axillary Axillary  SpO2: 97% 99% 99% 95%  Weight:      Height:        Intake/Output Summary (Last 24 hours) at 10/07/2017 1307 Last data filed at 10/07/2017 1036 Gross per 24 hour  Intake 470 ml  Output -  Net 470 ml   Filed Weights   10/05/17 0217  Weight: 80.2 kg (176 lb 12.9 oz)    Examination:  General exam: Appears calm and comfortable  Respiratory system: Clear to auscultation. Respiratory effort normal. Cardiovascular system: S1 & S2 heard, RRR. No JVD, murmurs, rubs, gallops or clicks. No pedal edema. Gastrointestinal system: Abdomen is nondistended, soft and nontender. No organomegaly or masses felt. Normal bowel sounds heard. Central nervous system: disoriented. No focal neurological deficits. Extremities: Symmetric 5 x 5 power. Skin:  No rashes, lesions or ulcers Psychiatry: Judgement and insight appear normal. Mood & affect appropriate.     Data Reviewed: I have personally reviewed following labs and imaging studies  CBC: Recent Labs  Lab 10/04/17 1455 10/05/17 0404  WBC 6.1 6.2  HGB 13.6 12.5*  HCT 41.2 38.5*  MCV 88.6 89.7  PLT 285 191    Basic Metabolic Panel: Recent Labs  Lab 10/04/17 1455 10/05/17 0404  NA 143  --   K 4.0  --   CL 107  --   CO2 25  --   GLUCOSE 91  --   BUN 23*  --   CREATININE 0.86 0.85  CALCIUM 9.4  --    GFR: Estimated Creatinine Clearance: 79.7 mL/min (by C-G formula based on SCr of 0.85 mg/dL). Liver Function Tests: Recent Labs  Lab 10/04/17 1455  AST 25  ALT 13*  ALKPHOS 51  BILITOT 0.9  PROT 7.9  ALBUMIN 4.3   Recent Labs  Lab 10/04/17 1455  LIPASE 23   Recent Labs  Lab 10/04/17 1820  AMMONIA 15   Coagulation Profile: No results for input(s): INR, PROTIME in the last 168 hours. Cardiac Enzymes: No results for input(s): CKTOTAL, CKMB, CKMBINDEX, TROPONINI in the last 168 hours. BNP (last 3 results) No results for input(s): PROBNP in the last 8760 hours. HbA1C: No results for input(s): HGBA1C in the last 72 hours. CBG: No results for input(s): GLUCAP in the last 168 hours. Lipid Profile: No results for input(s): CHOL, HDL, LDLCALC, TRIG, CHOLHDL, LDLDIRECT in the last 72 hours. Thyroid Function Tests: No results for input(s): TSH, T4TOTAL, FREET4, T3FREE, THYROIDAB in the last 72 hours. Anemia Panel: No results for input(s): VITAMINB12, FOLATE, FERRITIN, TIBC, IRON, RETICCTPCT in the last 72 hours. Sepsis Labs: No results for input(s): PROCALCITON, LATICACIDVEN in the last 168 hours.  No results found for this or any previous visit (from the past 240 hour(s)).       Radiology Studies: No results found.      Scheduled Meds: . amLODipine  10 mg Oral Daily  . aspirin EC  81 mg Oral Daily  . divalproex  250 mg Oral QHS  . enoxaparin (LOVENOX) injection  40 mg Subcutaneous Q24H  . folic acid  1 mg Oral Daily  . furosemide  80 mg Oral Daily  . hydrochlorothiazide  12.5 mg Oral Daily  . metoprolol tartrate  25 mg Oral BID  . QUEtiapine  50 mg Oral QHS  . senna  1 tablet Oral QHS  . thiamine injection  100 mg Intravenous Daily   Continuous  Infusions:   LOS: 2 days    Time spent: over 30 minutes    Fayrene Helper, MD [facility] Pager [phone]  If 7PM-7AM, please contact night-coverage [email or web address] Password [email or web address] 10/07/2017, 1:07 PM

## 2017-10-07 NOTE — Clinical Social Work Note (Signed)
Clinical Social Work Assessment  Patient Details  Name: Evan Charles MRN: [id number] Date of Birth: 1946-03-10  Date of referral:  10/07/17               Reason for consult:  Frequent Admissions / ED Visits, Discharge Planning                Permission sought to share information with:  Family Supports, Case Manager Permission granted to share information::  Yes, Verbal Permission Granted  Name::     [name], daughter [name]  Agency::  [place] worker [facility] [phone]  Relationship::     Contact Information:     Housing/Transportation Living arrangements for the past 2 months:  Single Family Home, Skilled Nursing Facility(was at SNF for 3 weeks, returned home 10/01/17) Source of Information:  Patient, Adult Children, Other (Comment Required)(APS, other family member nephew) Patient Interpreter Needed:  None Criminal Activity/Legal Involvement Pertinent to Current Situation/Hospitalization:  No - Comment as needed Significant Relationships:  Warehouse manager, Adult Children, Other Family Members Lives with:  Self Do you feel safe going back to the place where you live?  Yes Need for family participation in patient care:  Yes (Comment)(pt has fluctuating capacity per psychiatry evaluations)  Care giving concerns:  Pt from home where he resides alone. Pt well known to [facility] hospital system and CSW/CM team. Last psychiatry evaluation 09/11/17 indicates pt does not have capacity to make medical decisions. Pt presents to ED/hospital after being found in his home stuck on the floor and reportedly having defecated on himself (per nephew). Pt has presented under similar/same circumstances multiple other encounters. Pt refuses SNF placement due to "if I move into a nursing home, I'll lose his house." Family and community supports (meals on wheels, APS, Home health) concerned about pt's well-being at home, however, up to this time, pt remains his own legal guardian therefore is legally  making own decisions despite concern re: his lack of medical capacity per [facility] psychiatry evaluations.    Social Worker assessment / plan:  CSW consulted to assist with pt's disposition. See previous notes for detailed history- pt well known to care teams due to multiple past encounters under similar circumstances.  Pt continues to reside alone at home. Gets assistance from [employer]. Family (nephew) check on pt periodically but are unable to offer more assistance.  Pt agreed to SNF placement early November and was at [facility] until 10/01/17 when he opted to discharge home. (Used the remainder of his Medicare [facility] co-pay days and did not want to pay out of pocket for continued care- placement is custodial and not related to rehabilitation needs at this point, therefore insurance will not cover.)  Pt's nephew went to check on pt 10/04/17 and "found him on the floor, had feces on the floor too." Nephew is pt's primary family support (however daughter [name] is next of kin) and states, "I go check on him every day, but it's bad. He sometimes knows what's going on and is okay sitting there in the house but other times he doesn't know where he is and thinks his ex-wife is there and asks me to get him home, he doesn't get it that he already is at home."   APS case is open for pt for some time- Spoke with worker today who advises APS may pursue guardianship pending more information about pt's current mental status from psychiatry- psychiatry evaluation pending at this time. Will follow up with APS.  (  Up until now APS has not been able to justify petitioning for guardianship due to multiple factors- see past notes)  Also of note: Pt is a veteran but is not service connected- his clinical home is [facility] but he does not qualify for any kind of residential placement. ([facility] this with [facility] today)  Pt has had multiple past evaluations for capacity to make medical decisions.  Note that capacity is a fluctuating factor and legal incompetence is needed in order to have legal decision making reassigned.   Family involved but not able to provide much daily support and no family willing to be responsible for pt's care decisions.   During assessment today: Pt pleasant at times and irritable at others. Able to state name and DOB, place, and able to demonstrate teachback re: the nature of CSW's conversation with pt. However, at times he answers questions irrelevantly. Is fixated on discussing his "ex-wife trying to get people to rent rooms out of his house," however ex-wife has not been involved for several months per family. Pt tells CSW ex-wife lives in his house currently but family denies this, states pt is confused. Pt clear about his wishes to return home so that "I can keep my house, my one goal was to pay off and own my house," but does not respond in a meaningful way when CSW asks pt to describe what his living conditions have been there. Also unable to engage in conversation about what needs he has at home (responds "I don't know why anyone is concerned about that, there's nothing I can do to change it") Affect is flat and demonstrated poor eye contact.   Staffed case with Engineer, agricultural, also discussed with attending and will with psychiatry as well. Will follow up with APS re: outcome of psychiatry evaluation in order to determine course of action.   Employment status:  Retired Nurse, adult PT Recommendations:  Not assessed at this time Information / Referral to community resources:     Patient/Family's Response to care:  Pt dismissive of care. Also reports he wants to be in his home  Patient/Family's Understanding of and Emotional Response to Diagnosis, Current Treatment, and Prognosis:  Pt unable to demonstrate understanding of events leading to this encounter. Orientation very fluctuating. Is able to clearly state his wishes and reasoning  for them but unable to have meaningful conversations about his care needs and barriers to being successful alone at home.   Emotional Assessment Appearance:  Appears stated age Attitude/Demeanor/Rapport:  (fluctuating- is engaged but irritable) Affect (typically observed):  Flat Orientation:  Oriented to Self, Oriented to Place(fluctuating) Alcohol / Substance use:  Not Applicable Psych involvement (Current and /or in the community):  Yes (Comment)(in -hospital consults for capacity to make decisions)  Discharge Needs  Concerns to be addressed:  Discharge Planning Concerns, Home Safety Concerns, Care Coordination Readmission within the last 30 days:  Yes Current discharge risk:  Lack of support system, Lives alone, Dependent with Mobility Barriers to Discharge:  (still assessing)   Nila Nephew, LCSW 10/07/2017, 5:05 PM  [phone]

## 2017-10-08 DIAGNOSIS — Z87891 Personal history of nicotine dependence: Secondary | ICD-10-CM

## 2017-10-08 DIAGNOSIS — R443 Hallucinations, unspecified: Secondary | ICD-10-CM

## 2017-10-08 DIAGNOSIS — R413 Other amnesia: Secondary | ICD-10-CM

## 2017-10-08 DIAGNOSIS — R4182 Altered mental status, unspecified: Secondary | ICD-10-CM

## 2017-10-08 DIAGNOSIS — Z008 Encounter for other general examination: Secondary | ICD-10-CM

## 2017-10-08 NOTE — Progress Notes (Signed)
CSW met with Tia from [place]. She provided release of information paperwork to place on the patient chart. CSW placed on patient chart.  She requested clinical documentation about patient mental capacity. CSW provided the patient psychiatric evaluation dated for 11/27.  She reports she will continue to update CSW about Guardianship process.   CSW will continue to assist with patient disposition.
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
## 2017-10-08 NOTE — Progress Notes (Signed)
PROGRESS NOTE    Evan Charles  BDZ:[id number] DOB: 15-Nov-1945 DOA: 10/04/2017 PCP: [facility]   Brief Narrative:  Evan Charles 71 y.o.malewith medical history significant ofdementia, cva, anemia, failure to thrive, who is brought via ems after being found by nephew who went to check on him. Found down at home incoherent, sitting in own feces. Multiple recent hospitalizations for similar episodes. Once found to have rhabdomyolysis. At recent hospitalizations discharge to SNF recommended but apparently patient refuses, though does also appear he has been found to not be competent and has been referred to APS.   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  Per social work, APS currently not pursuing guardianship, but may pending more information about pt's current mental status (per CM, they have been unable to justify petitioning for guardianship up until know). -No clear precipitant for his mental status changes on admission, head CT without acute change, chest x-ray without clear-cut infiltrate, UA is normal, he is afebrile and his white count is normal  HTN -continue Norvasc, Lasix, HCTZ, Metoprolol, intermittently refusing p.o. meds  Agitation -continue seroquel, ativan PRN  FTT (failure to thrive) in adult  Prior CVA -continue Aspirin   DVT  prophylaxis: lovenox Code Status: full  Family Communication: none at bedside Disposition Plan: pending safe discharge plan   Consultants:   Psychiatry  Procedures: (Don't include imaging studies which can be auto populated. Include things that cannot be auto populated i.e. Echo, Carotid and venous dopplers, Foley, Bipap, HD, tubes/drains, wound vac, central lines etc)  none  Antimicrobials: (specify start and planned stop date. Auto populated tables are space occupying and do not give end dates)  none    Subjective: Doesn't understand why he's still here. Not consistently answering questions. No pain other than chroncic back pain.  Objective: Vitals:   10/07/17 1622 10/07/17 1623 10/07/17 2132 10/08/17 0650  BP: (!) 144/95 (!) 144/95 122/88 (!) 88/63  Pulse:  96 96 92  Resp:   18 17  Temp:   98 F (36.7 C) 98.2 F (36.8 C)  TempSrc:   Oral Oral  SpO2:   99% 96%  Weight:      Height:        Intake/Output Summary (Last 24 hours) at 10/08/2017 1433 Last data filed at 10/07/2017 2030 Gross per 24 hour  Intake 1307 ml  Output -  Net 1307 ml   Filed Weights   10/05/17 0217  Weight: 80.2 kg (176 lb 12.9 oz)    Examination:  General: No acute distress. Cardiovascular: Heart sounds show Keriana Sarsfield regular rate, and rhythm. No gallops or rubs. No murmurs. No JVD. Lungs: Clear to auscultation bilaterally with good air movement. No rales, rhonchi or wheezes. Abdomen: Soft, nontender, nondistended with normal active bowel sounds. No masses. No hepatosplenomegaly. Neurological: Disoriented.  Cranial nerves II through XII grossly intact. Skin:  Warm and dry. No rashes or lesions. Extremities: No clubbing or cyanosis. No edema.   Data Reviewed: I have personally reviewed following labs and imaging studies  CBC: Recent Labs  Lab 10/04/17 1455 10/05/17 0404  WBC 6.1 6.2  HGB 13.6 12.5*  HCT 41.2 38.5*  MCV 88.6 89.7  PLT 285 956   Basic Metabolic Panel: Recent Labs    Lab 10/04/17 1455 10/05/17 0404  NA 143  --   K 4.0  --   CL 107  --   CO2 25  --   GLUCOSE 91  --   BUN 23*  --   CREATININE 0.86 0.85  CALCIUM 9.4  --    GFR: Estimated Creatinine Clearance: 79.7 mL/min (by C-G formula based on SCr of 0.85 mg/dL). Liver Function Tests: Recent Labs  Lab 10/04/17 1455  AST 25  ALT 13*  ALKPHOS 51  BILITOT 0.9  PROT 7.9  ALBUMIN 4.3   Recent Labs  Lab 10/04/17 1455  LIPASE 23   Recent Labs  Lab 10/04/17 1820  AMMONIA 15   Coagulation Profile: No results for input(s): INR, PROTIME in the last 168 hours. Cardiac Enzymes: No results for input(s): CKTOTAL, CKMB, CKMBINDEX, TROPONINI in the last 168 hours. BNP (last 3 results) No results for input(s): PROBNP in the last 8760 hours. HbA1C: No results for input(s): HGBA1C in the last 72 hours. CBG: No results for input(s): GLUCAP in the last 168 hours. Lipid Profile: No results for input(s): CHOL, HDL, LDLCALC, TRIG, CHOLHDL, LDLDIRECT in the last 72 hours. Thyroid Function Tests: No results for input(s): TSH, T4TOTAL, FREET4, T3FREE, THYROIDAB in the last 72 hours. Anemia Panel: No results for input(s): VITAMINB12, FOLATE, FERRITIN, TIBC, IRON, RETICCTPCT in the last 72 hours. Sepsis Labs: No results for input(s): PROCALCITON, LATICACIDVEN in the last 168 hours.  No results found for this or any previous visit (from the past 240 hour(s)).       Radiology Studies: No results found.      Scheduled Meds: . amLODipine  10 mg Oral Daily  . aspirin EC  81 mg Oral Daily  . divalproex  250 mg Oral QHS  . enoxaparin (LOVENOX) injection  40 mg Subcutaneous Q24H  . feeding supplement (ENSURE ENLIVE)  237 mL Oral BID BM  . folic acid  1 mg Oral Daily  . furosemide  80 mg Oral Daily  . hydrochlorothiazide  12.5 mg Oral Daily  . metoprolol tartrate  25 mg Oral BID  . QUEtiapine  50 mg Oral QHS  . senna  1 tablet Oral QHS  . thiamine injection  100 mg Intravenous Daily    Continuous Infusions:   LOS: 3 days    Time spent: over 15 minutes    Fayrene Helper, MD [facility] Pager [phone]  If 7PM-7AM, please contact night-coverage [email or web address] Password [facility] 10/08/2017, 2:33 PM

## 2017-10-08 NOTE — Progress Notes (Signed)
Nutrition Brief Note  Patient identified on the Malnutrition Screening Tool (MST) Report  Patient with fluctuating weights per chart review, now back what he weighed in June 2018. Pt eating very well, 100% of >3 meals/day. Pt is receiving Ensure supplements and drinking those. Pt with dementia which may be affecting eating at home.   Wt Readings from Last 15 Encounters:  10/05/17 176 lb 12.9 oz (80.2 kg)  09/17/17 161 lb 4.1 oz (73.1 kg)  09/05/17 185 lb (83.9 kg)  08/31/17 176 lb 5.9 oz (80 kg)  06/13/17 192 lb 0.3 oz (87.1 kg)  06/06/17 192 lb 1.6 oz (87.1 kg)  05/31/17 190 lb (86.2 kg)  05/29/17 190 lb (86.2 kg)  05/06/17 179 lb 12.5 oz (81.5 kg)  05/01/17 175 lb 3.2 oz (79.5 kg)  03/16/17 185 lb (83.9 kg)  11/02/16 188 lb (85.3 kg)  10/23/16 188 lb (85.3 kg)  09/12/16 190 lb 4.1 oz (86.3 kg)  07/25/16 187 lb (84.8 kg)    Body mass index is 26.11 kg/m. Patient meets criteria for overweight based on current BMI.   Current diet order is Heart Healthy, patient is consuming approximately 100% of meals at this time. Labs and medications reviewed.   No nutrition interventions warranted at this time. If nutrition issues arise, please consult RD.   Evan Bibles, MS, RD, [place] Dietitian Pager: [phone] After Hours Pager: [phone]

## 2017-10-09 NOTE — Plan of Care (Signed)
  Education: Knowledge of General Education information will improve 10/09/2017 1708 - Progressing by Dorene Sorrow, RN   Elimination: Will not experience complications related to bowel motility 10/09/2017 1708 - Progressing by Dorene Sorrow, RN

## 2017-10-09 NOTE — Progress Notes (Signed)
PROGRESS NOTE    Evan Charles  GYI:[id number] DOB: 08-02-1946 DOA: 10/04/2017 PCP: [facility]   Brief Narrative:  Evan Charles Austinis a 71 y.o.malewith medical history significant ofdementia, cva, anemia, failure to thrive, who is brought via ems after being found by nephew who went to check on him. Found down at home incoherent, sitting in own feces. Multiple recent hospitalizations for similar episodes. Once found to have rhabdomyolysis. At recent hospitalizations discharge to SNF recommended but apparently patient refuses, though does also appear he has been found to not be competent and has been referred to APS.   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  Recommended authorized representative to help make medical decisions.  APS currently petitioning for guardianship.  -No clear precipitant for his mental status changes on admission, head CT without acute change, chest x-ray without clear-cut infiltrate, UA is normal, he is afebrile and his white count is normal  HTN -continue Norvasc, Lasix, HCTZ, Metoprolol  Agitation -continue seroquel, ativan PRN  FTT (failure to thrive) in adult  Prior CVA -continue Aspirin   DVT prophylaxis: lovenox Code Status: full  Family Communication: none at bedside Disposition Plan: pending safe discharge plan   Consultants:   Psychiatry  Procedures: (Don't include imaging studies which can be auto populated.  Include things that cannot be auto populated i.e. Echo, Carotid and venous dopplers, Foley, Bipap, HD, tubes/drains, wound vac, central lines etc)  none  Antimicrobials: (specify start and planned stop date. Auto populated tables are space occupying and do not give end dates)  none    Subjective: Upset that we're keeping him here.  Objective: Vitals:   10/08/17 2025 10/09/17 0453 10/09/17 1220 10/09/17 1438  BP: 106/66 (!) 97/55 114/76 (!) 155/77  Pulse: 98 79 85 74  Resp: 20 16 16 16   Temp: 98 F (36.7 C) 98.4 F (36.9 C)  98.6 F (37 C)  TempSrc: Oral Oral  Oral  SpO2: 99% 95% 96% 98%  Weight:      Height:        Intake/Output Summary (Last 24 hours) at 10/09/2017 2134 Last data filed at 10/09/2017 1900 Gross per 24 hour  Intake 240 ml  Output 1500 ml  Net -1260 ml   Filed Weights   10/05/17 0217  Weight: 80.2 kg (176 lb 12.9 oz)    Examination:  General: No acute distress. Cardiovascular: Heart sounds show a regular rate, and rhythm. No gallops or rubs. No murmurs. No JVD. Lungs: Clear to auscultation bilaterally with good air movement. No rales, rhonchi or wheezes. Abdomen: Soft, nontender, nondistended with normal active bowel sounds. No masses. No hepatosplenomegaly. Neurological: Alert and oriented 3. Moves all extremities 4 with equal strength. Cranial nerves II through XII grossly intact. Skin: Warm and dry. No rashes or lesions. Extremities: No clubbing or cyanosis. No edema. Pedal pulses 2+. Psychiatric: Mood and affect are down. Insight and judgment are impaired.  Data Reviewed: I have personally reviewed following labs and imaging  studies  CBC: Recent Labs  Lab 10/04/17 1455 10/05/17 0404  WBC 6.1 6.2  HGB 13.6 12.5*  HCT 41.2 38.5*  MCV 88.6 89.7  PLT 285 086   Basic Metabolic Panel: Recent Labs  Lab 10/04/17 1455 10/05/17 0404  NA 143  --   K 4.0  --   CL 107  --   CO2 25  --   GLUCOSE 91  --   BUN 23*  --   CREATININE 0.86  0.85  CALCIUM 9.4  --    GFR: Estimated Creatinine Clearance: 79.7 mL/min (by C-G formula based on SCr of 0.85 mg/dL). Liver Function Tests: Recent Labs  Lab 10/04/17 1455  AST 25  ALT 13*  ALKPHOS 51  BILITOT 0.9  PROT 7.9  ALBUMIN 4.3   Recent Labs  Lab 10/04/17 1455  LIPASE 23   Recent Labs  Lab 10/04/17 1820  AMMONIA 15   Coagulation Profile: No results for input(s): INR, PROTIME in the last 168 hours. Cardiac Enzymes: No results for input(s): CKTOTAL, CKMB, CKMBINDEX, TROPONINI in the last 168 hours. BNP (last 3 results) No results for input(s): PROBNP in the last 8760 hours. HbA1C: No results for input(s): HGBA1C in the last 72 hours. CBG: No results for input(s): GLUCAP in the last 168 hours. Lipid Profile: No results for input(s): CHOL, HDL, LDLCALC, TRIG, CHOLHDL, LDLDIRECT in the last 72 hours. Thyroid Function Tests: No results for input(s): TSH, T4TOTAL, FREET4, T3FREE, THYROIDAB in the last 72 hours. Anemia Panel: No results for input(s): VITAMINB12, FOLATE, FERRITIN, TIBC, IRON, RETICCTPCT in the last 72 hours. Sepsis Labs: No results for input(s): PROCALCITON, LATICACIDVEN in the last 168 hours.  No results found for this or any previous visit (from the past 240 hour(s)).       Radiology Studies: No results found.      Scheduled Meds: . amLODipine  10 mg Oral Daily  . aspirin EC  81 mg Oral Daily  . divalproex  250 mg Oral QHS  . enoxaparin (LOVENOX) injection  40 mg Subcutaneous Q24H  . feeding supplement (ENSURE ENLIVE)  237 mL Oral BID BM  . folic acid  1 mg Oral Daily  . furosemide  80 mg Oral Daily  . hydrochlorothiazide  12.5 mg Oral Daily  . metoprolol tartrate  25 mg Oral BID  . QUEtiapine  50 mg Oral QHS  . senna  1 tablet Oral QHS  . thiamine injection  100 mg Intravenous Daily   Continuous Infusions:   LOS: 4 days    Time spent: over 15 minutes    Fayrene Helper, MD [facility] Pager  [phone]  If 7PM-7AM, please contact night-coverage [email or web address] Password [facility] 10/09/2017, 9:34 PM

## 2017-10-09 NOTE — Care Management Important Message (Signed)
Important Message  Patient Details  Name: Evan Charles MRN: [id number] Date of Birth: 16-Jan-1946   Medicare Important Message Given:  Yes    Kerin Salen 10/09/2017, 10:27 AMImportant Message  Patient Details  Name: Evan Charles MRN: [id number] Date of Birth: 16-Oct-1946   Medicare Important Message Given:  Yes    Kerin Salen 10/09/2017, 10:26 AM

## 2017-10-09 NOTE — Progress Notes (Signed)
CSW following to assist with disposition. Complex case, see past notes.  Spoke with APS worker today who confirms APS is petitioning for guardianship. States that CSW/hospital will be kept informed re: status, and that guardianship petition can be filed Tuesday 10/15/17.  Spoke with pt's [name] to update him re: APS plan- states he is available as needed for assistance/support if CSW or APS needs. States he spoke with pt "yesterday and keep trying to talk him into agreeing to a nursing home." Updated CSW director.  Will continue following.   Sharren Bridge, MSW, LCSW Clinical Social Work 10/09/2017 [phone]

## 2017-10-10 MED ORDER — VITAMIN B-1 100 MG PO TABS
100.0000 mg | ORAL_TABLET | Freq: Every day | ORAL | Status: DC
  Administered 2017-10-10 – 2017-10-25 (×16): 100 mg via ORAL
  Filled 2017-10-10 (×16): qty 1

## 2017-10-10 NOTE — Consult Note (Signed)
   [facility] Inpatient Consult   10/10/2017  HARACE MCCLUNEY 04-13-1946 [id number]    Screened for potential [facility] Care Management services due to frequent hospitalizations.  However, patient is not eligible for [facility] Care Management at this time due to his Primary Care Provider, [facility], not being a [facility] Provider.   Left voicemail for inpatient RNCM to make aware that patient is not eligible for [facility] Care Management services at this time due to Primary Care Provider not being a [facility] Provider.    Marthenia Rolling, MSN-Ed, RN,BSN [facility] Liaison [phone]

## 2017-10-10 NOTE — Progress Notes (Signed)
CSW following for disposition- complex case, see past notes.  Left voicemail for APS worker today requesting updates re: date for guardianship hearing. Staffed with CSW Print production planner- facility referrals pending and will be able to progress once hearing date known.  Attempted to reach pt's nephew to update him - voicemail box full.  Sharren Bridge, MSW, LCSW Clinical Social Work 10/10/2017 [phone]

## 2017-10-10 NOTE — Progress Notes (Signed)
PHARMACIST - PHYSICIAN COMMUNICATION  DR:  Renne Crigler  CONCERNING: IV to Oral Route Change Policy  RECOMMENDATION: This patient is receiving Thiamine by the intravenous route.  Based on criteria approved by the Pharmacy and Therapeutics Committee, the intravenous medication(s) is/are being converted to the equivalent oral dose form(s).   DESCRIPTION: These criteria include:  The patient is eating (either orally or via tube) and/or has been taking other orally administered medications for a least 24 hours  The patient has no evidence of active gastrointestinal bleeding or impaired GI absorption (gastrectomy, short bowel, patient on TNA or NPO).  If you have questions about this conversion, please contact the Pharmacy Department  []   [phone] )  [facility] []   [phone] )  [facility] []   [phone] )  [facility] []   [phone] )  [facility] []   [phone] )  [place], [place], [name] 10/10/2017 8:19 AM

## 2017-10-10 NOTE — Progress Notes (Signed)
PROGRESS NOTE  Evan Charles LDJ:[id number] DOB: [birth year] DOA: 10/04/2017 [facility]: [facility]   LOS: 5 days   Brief Narrative / Interim history: Evan Charles is a 71 y.o. male with medical history significant of dementia, cva, anemia, failure to thrive, who is brought via ems after being found by nephew who went to check on him. Found down at home incoherent, sitting in own feces. Multiple recent hospitalizations for similar episodes. Once found to have rhabdomyolysis. At recent hospitalizations discharge to SNF recommended but apparently patient refuses, though does also appear he has been found to not be competent and has been referred to APS.   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[place])   Visual hallucinations   Failure to thrive in adult   Essential hypertension   Physical deconditioning   Dementia with psychosis   Stroke (cerebrum) ([place])   Dementia   Dementia  -patient with dementia unable to live at home by himself in unsafe conditions, admission he was noncommunicative and aggressive towards this MD as well as nursing staff -Social worker consulted -Cannot make medical decisions, lacks capacity -No clear precipitant for his mental status changes on admission, chest x-ray without clear-cut infiltrate, UA is normal, he is afebrile and his white count is normal  HTN -continue Norvasc, Lasix, HCTZ  Agitation -continue seroquel, ativan PRN   FTT (failure to thrive) in adult  Prior CVA -continue Aspirin    DVT prophylaxis: Lovenox Code Status: Full code Family Communication: no family at bedside Disposition Plan: TBD  Consultants:   none  Procedures:   None   Antimicrobials:  None    Subjective: -Very upset that we're keeping him here  Objective: Vitals:   10/09/17 2200 10/10/17 0645 10/10/17 1206 10/10/17 1307  BP: 115/75 (!) 104/55 103/66 101/67  Pulse:  85 73 81 86  Resp:  20  20  Temp:  97.6 F (36.4 C)  98 F (36.7 C)  TempSrc:  Oral  Axillary  SpO2:  97%  98%  Weight:      Height:        Intake/Output Summary (Last 24 hours) at 10/10/2017 1525 Last data filed at 10/10/2017 1309 Gross per 24 hour  Intake 720 ml  Output 1200 ml  Net -480 ml   Filed Weights   10/05/17 0217  Weight: 80.2 kg (176 lb 12.9 oz)    Examination:  Constitutional: NAD Respiratory: no wheezing  Cardiovascular: RRR  Data Reviewed: I have independently reviewed following labs and imaging studies   CBC: Recent Labs  Lab 10/04/17 1455 10/05/17 0404  WBC 6.1 6.2  HGB 13.6 12.5*  HCT 41.2 38.5*  MCV 88.6 89.7  PLT 285 030   Basic Metabolic Panel: Recent Labs  Lab 10/04/17 1455 10/05/17 0404  NA 143  --   K 4.0  --   CL 107  --   CO2 25  --   GLUCOSE 91  --   BUN 23*  --   CREATININE 0.86 0.85  CALCIUM 9.4  --    GFR: Estimated Creatinine Clearance: 79.7 mL/min (by C-G formula based on SCr of 0.85 mg/dL). Liver Function Tests: Recent Labs  Lab 10/04/17 1455  AST 25  ALT 13*  ALKPHOS 51  BILITOT 0.9  PROT 7.9  ALBUMIN 4.3   Recent Labs  Lab 10/04/17 1455  LIPASE 23   Recent Labs  Lab 10/04/17 1820  AMMONIA 15  Coagulation Profile: No results for input(s): INR, PROTIME in the last 168 hours. Cardiac Enzymes: No results for input(s): CKTOTAL, CKMB, CKMBINDEX, TROPONINI in the last 168 hours. BNP (last 3 results) No results for input(s): PROBNP in the last 8760 hours. HbA1C: No results for input(s): HGBA1C in the last 72 hours. CBG: No results for input(s): GLUCAP in the last 168 hours. Lipid Profile: No results for input(s): CHOL, HDL, LDLCALC, TRIG, CHOLHDL, LDLDIRECT in the last 72 hours. Thyroid Function Tests: No results for input(s): TSH, T4TOTAL, FREET4, T3FREE, THYROIDAB in the last 72 hours. Anemia Panel: No results for input(s): VITAMINB12, FOLATE, FERRITIN, TIBC, IRON, RETICCTPCT in the last 72  hours. Urine analysis:    Component Value Date/Time   COLORURINE YELLOW 10/04/2017 2145   APPEARANCEUR CLEAR 10/04/2017 2145   LABSPEC 1.024 10/04/2017 2145   PHURINE 5.0 10/04/2017 2145   GLUCOSEU NEGATIVE 10/04/2017 2145   HGBUR NEGATIVE 10/04/2017 2145   BILIRUBINUR NEGATIVE 10/04/2017 2145   KETONESUR 80 (A) 10/04/2017 2145   PROTEINUR NEGATIVE 10/04/2017 2145   UROBILINOGEN 0.2 02/13/2015 1957   NITRITE NEGATIVE 10/04/2017 2145   LEUKOCYTESUR NEGATIVE 10/04/2017 2145   Sepsis Labs: Invalid input(s): PROCALCITONIN, LACTICIDVEN  No results found for this or any previous visit (from the past 240 hour(s)).    Radiology Studies: No results found.   Scheduled Meds: . amLODipine  10 mg Oral Daily  . aspirin EC  81 mg Oral Daily  . divalproex  250 mg Oral QHS  . enoxaparin (LOVENOX) injection  40 mg Subcutaneous Q24H  . feeding supplement (ENSURE ENLIVE)  237 mL Oral BID BM  . folic acid  1 mg Oral Daily  . furosemide  80 mg Oral Daily  . hydrochlorothiazide  12.5 mg Oral Daily  . metoprolol tartrate  25 mg Oral BID  . QUEtiapine  50 mg Oral QHS  . senna  1 tablet Oral QHS  . thiamine  100 mg Oral Daily   Continuous Infusions:  Marzetta Board, MD, PhD [facility] Pager [phone]  If 7PM-7AM, please contact night-coverage [email or web address] Password [email or web address] 10/10/2017, 3:25 PM

## 2017-10-10 NOTE — NC FL2 (Signed)
[place] LEVEL OF CARE SCREENING TOOL     IDENTIFICATION  Patient Name: Evan Charles Birthdate: 07/09/1946 Sex: male Admission Date (Current Location): 10/04/2017  [facility] [place] Number:  Herbalist and Address:  [facility],  [place], [place]      Provider Number: [id number]  Attending Physician Name and Address:  Caren Griffins, MD  Relative Name and Phone Number:  Leonia Corona, daughter, [phone]    Current Level of Care: Hospital Recommended Level of Care: [place] Prior Approval Number:    Date Approved/Denied:   PASRR Number: [id number]  Discharge Plan: SNF    Current Diagnoses: Patient Active Problem List   Diagnosis Date Noted  . Evaluation by psychiatric service required 10/08/2017  . Delirium   . Gram-negative bacteremia 09/07/2017  . Dementia 09/07/2017  . GERD (gastroesophageal reflux disease) 09/07/2017  . Frequent falls 09/07/2017  . Metabolic encephalopathy 23/55/7322  . Dehydration, moderate 09/07/2017  . FTT (failure to thrive) in adult 09/07/2017  . HTN (hypertension) 09/07/2017  . Prediabetes 06/08/2017  . Stroke (cerebrum) ([place])   . 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 Dystonia 06/07/2017  . Weakness 06/06/2017  . Accelerated hypertension 06/06/2017  . Leg weakness 04/28/2017  . 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 Impaired mobility and ADLs   . Hypokalemia 11/05/2016  . Dementia with psychosis 09/12/2016  . Acute delirium 09/08/2016  . Normocytic anemia 09/08/2016  . Acute metabolic encephalopathy 02/54/2706  . Rhabdomyolysis 09/08/2016  . Fall at home   . Fall 07/25/2016  . Mild cognitive impairment 07/25/2016  . Immobility 03/20/2016  . Generalized weakness 03/20/2016  . Failure to thrive in adult 03/20/2016  . Neuropathy   . Essential hypertension   . Physical  deconditioning   . 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 GERD without esophagitis 12/19/2015  . Bilateral lower extremity edema 12/19/2015  . Intractable low back pain 02/14/2015  . Difficulty walking 02/14/2015  . H/O: upper GI bleed   . Visual hallucinations   . 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[place])     Orientation RESPIRATION BLADDER Height & Weight     Self, Place  Normal Incontinent, External catheter Weight: 176 lb 12.9 oz (80.2 kg) Height:  5\' 9"  (175.3 cm)  BEHAVIORAL SYMPTOMS/MOOD NEUROLOGICAL BOWEL NUTRITION STATUS      Incontinent Diet(heart healthy)  AMBULATORY STATUS COMMUNICATION OF NEEDS Skin   Extensive Assist Verbally Normal                       Personal Care Assistance Level of Assistance    Bathing Assistance: Maximum assistance Feeding assistance: Independent Dressing Assistance: Limited assistance     Functional Limitations Info  Sight, Hearing, Speech Sight Info: Adequate Hearing Info: Adequate Speech Info: Adequate    SPECIAL CARE FACTORS FREQUENCY  PT (By licensed PT), OT (By licensed OT)     PT Frequency: 5x OT Frequency: 5x            Contractures Contractures Info: Not present    Additional Factors Info  Code Status, Allergies, Psychotropic Code Status Info: full code Allergies Info: tomato Psychotropic Info: Depakote, Seroquel         Current Medications (10/10/2017):  This is the current hospital active medication list Current Facility-Administered Medications  Medication Dose Route Frequency Provider Last Rate Last Dose  . amLODipine (NORVASC) tablet 10 mg  10 mg Oral  Daily Wouk, Ailene Rud, MD   10 mg at 10/09/17 1223  . aspirin EC tablet 81 mg  81 mg Oral Daily Caren Griffins, MD   81 mg at 10/09/17 1223  . divalproex (DEPAKOTE) DR tablet 250 mg  250 mg Oral QHS Wouk, Ailene Rud, MD   250 mg at 10/09/17 2200  . enoxaparin (LOVENOX) injection 40 mg  40 mg Subcutaneous Q24H Gwynne Edinger, MD   40 mg at  10/09/17 1222  . feeding supplement (ENSURE ENLIVE) (ENSURE ENLIVE) liquid 237 mL  237 mL Oral BID BM Elodia Florence., MD   237 mL at 10/09/17 1618  . folic acid (FOLVITE) tablet 1 mg  1 mg Oral Daily Wouk, Ailene Rud, MD   1 mg at 10/09/17 1222  . furosemide (LASIX) tablet 80 mg  80 mg Oral Daily Gwynne Edinger, MD   80 mg at 10/09/17 1221  . haloperidol lactate (HALDOL) injection 2 mg  2 mg Intravenous Q6H PRN Elodia Florence., MD      . hydrochlorothiazide (MICROZIDE) capsule 12.5 mg  12.5 mg Oral Daily Gwynne Edinger, MD   12.5 mg at 10/09/17 1223  . ipratropium-albuterol (DUONEB) 0.5-2.5 (3) MG/3ML nebulizer solution 3 mL  3 mL Nebulization Q4H PRN Caren Griffins, MD      . LORazepam (ATIVAN) injection 0.5 mg  0.5 mg Intravenous Q6H PRN Caren Griffins, MD   0.5 mg at 10/06/17 1813  . metoprolol tartrate (LOPRESSOR) tablet 25 mg  25 mg Oral BID Gwynne Edinger, MD   25 mg at 10/09/17 2201  . QUEtiapine (SEROQUEL) tablet 50 mg  50 mg Oral QHS Gwynne Edinger, MD   50 mg at 10/09/17 2155  . senna (SENOKOT) tablet 8.6 mg  1 tablet Oral QHS Gwynne Edinger, MD   8.6 mg at 10/09/17 2153  . thiamine (VITAMIN B-1) tablet 100 mg  100 mg Oral Daily Minda Ditto, [facility]         Discharge Medications: Please see discharge summary for a list of discharge medications.  Relevant Imaging Results:  Relevant Lab Results:   Additional Information SSN [id number]  Nila Nephew, LCSW

## 2017-10-11 MED ORDER — AMLODIPINE BESYLATE 5 MG PO TABS
5.0000 mg | ORAL_TABLET | Freq: Every day | ORAL | Status: DC
  Administered 2017-10-12 – 2017-10-14 (×3): 5 mg via ORAL
  Filled 2017-10-11 (×3): qty 1

## 2017-10-11 NOTE — Progress Notes (Signed)
PROGRESS NOTE    KRISTA SOM  KDX:[id number] DOB: June 16, 1946 DOA: 10/04/2017 PCP: [facility]   Brief Narrative: 71 y.o.malewith medical history significant ofdementia, cva, anemia, failure to thrive, who is brought via ems after being found by nephew who went to check on him. Found down at home incoherent, sitting in own feces. Multiple recent hospitalizations for similar episodes. Once found to have rhabdomyolysis.  Patient has no capacity to make decision and has been referred to APS.  Social worker following.  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  Lacks capacity to make clinical decision.  Patient likely has chronic gradual decline in his mental status.  No sign of infection.  The social worker following for safe discharge planning.  Waiting guardianship hearing. -Continue Seroquel and Ativan as needed for the management of agitation  #Hypertension: Blood pressure borderline low.  I will reduce the dose of Norvasc to 5 mg and discontinue hydrochlorothiazide.  Continue Lasix.  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  Denies headache, dizziness, nausea vomiting chest pain shortness of breath.  Objective: Vitals:   10/10/17 1307 10/10/17 2012 10/11/17 0429 10/11/17 1314  BP: 101/67 108/71 99/62 95/61   Pulse: 86 84 81 80  Resp: 20 18 16  16   Temp: 98 F (36.7 C) 98.9 F (37.2 C) 98.8 F (37.1 C) (!) 97.5 F (36.4 C)  TempSrc: Axillary Oral Oral Oral  SpO2: 98% 98% 96% 97%  Weight:      Height:        Intake/Output Summary (Last 24 hours) at 10/11/2017 1538 Last data filed at 10/11/2017 1030 Gross per 24 hour  Intake 360 ml  Output 2200 ml  Net -1840 ml   Filed Weights   10/05/17 0217  Weight: 80.2 kg (176 lb 12.9 oz)    Examination:  General exam: Appears calm and comfortable  Respiratory system: Clear to auscultation. Respiratory effort normal. No wheezing or crackle Cardiovascular system: S1 & S2 heard, RRR.  No pedal edema. Gastrointestinal system: Abdomen is nondistended, soft and nontender. Normal bowel sounds heard. Central nervous system: Alert awake and following commands Skin: No rashes, lesions or ulcers Psychiatry: Judgement and insight appear impaired    Data Reviewed: I have personally reviewed following labs and imaging studies  CBC: Recent Labs  Lab 10/05/17 0404  WBC 6.2  HGB 12.5*  HCT 38.5*  MCV 89.7  PLT 976   Basic Metabolic Panel: Recent Labs  Lab 10/05/17 0404  CREATININE 0.85   GFR: Estimated Creatinine Clearance: 79.7 mL/min (by C-G formula based on SCr of 0.85 mg/dL). Liver Function Tests: No results for input(s): AST, ALT, ALKPHOS, BILITOT, PROT, ALBUMIN in the last 168 hours. No results for input(s): LIPASE, AMYLASE in the last 168 hours. Recent Labs  Lab 10/04/17 1820  AMMONIA 15   Coagulation Profile: No results for input(s): INR, PROTIME in the last 168 hours. Cardiac Enzymes: No  results for input(s): CKTOTAL, CKMB, CKMBINDEX, TROPONINI in the last 168 hours. BNP (last 3 results) No results for input(s): PROBNP in the last 8760 hours. HbA1C: No results for input(s): HGBA1C in the last 72 hours. CBG: No results for input(s): GLUCAP in the last 168 hours. Lipid Profile: No results for input(s): CHOL, HDL, LDLCALC, TRIG, CHOLHDL, LDLDIRECT in the  last 72 hours. Thyroid Function Tests: No results for input(s): TSH, T4TOTAL, FREET4, T3FREE, THYROIDAB in the last 72 hours. Anemia Panel: No results for input(s): VITAMINB12, FOLATE, FERRITIN, TIBC, IRON, RETICCTPCT in the last 72 hours. Sepsis Labs: No results for input(s): PROCALCITON, LATICACIDVEN in the last 168 hours.  No results found for this or any previous visit (from the past 240 hour(s)).       Radiology Studies: No results found.      Scheduled Meds: . amLODipine  10 mg Oral Daily  . aspirin EC  81 mg Oral Daily  . divalproex  250 mg Oral QHS  . enoxaparin (LOVENOX) injection  40 mg Subcutaneous Q24H  . feeding supplement (ENSURE ENLIVE)  237 mL Oral BID BM  . folic acid  1 mg Oral Daily  . furosemide  80 mg Oral Daily  . hydrochlorothiazide  12.5 mg Oral Daily  . metoprolol tartrate  25 mg Oral BID  . QUEtiapine  50 mg Oral QHS  . senna  1 tablet Oral QHS  . thiamine  100 mg Oral Daily   Continuous Infusions:   LOS: 6 days    Bricen Victory Tanna Furry, MD [facility] Pager [phone]  If 7PM-7AM, please contact night-coverage [email or web address] Password [email or web address] 10/11/2017, 3:38 PM

## 2017-10-11 NOTE — Evaluation (Signed)
Physical Therapy Evaluation Patient Details Name: Evan Charles MRN: [id number] DOB: 01-27-1946 Today's Date: 10/11/2017   History of Present Illness  71 yo male admitted with FTT, fall at home, weakness. Hx of Sz, spinal stenosis, dementia, CVA, neuropathy, anemia  Clinical Impression  On eval, pt required Mod assist +2 for mobility. He was able to stand at bedside for at least 20- seconds before taking a few lateral steps towards the [facility] with a RW. Pt presents with general weakness, decreased activity tolerance,and impaired gait and balance. No family present during session. Recommend SNF. Will follow during hospital stay.     Follow Up Recommendations SNF    Equipment Recommendations       Recommendations for Other Services OT consult     Precautions / Restrictions Precautions Precautions: Fall Precaution Comments: can be agitated Restrictions Weight Bearing Restrictions: No      Mobility  Bed Mobility Overal bed mobility: Needs Assistance Bed Mobility: Supine to Sit;Sit to Supine     Supine to sit: Mod assist Sit to supine: Mod assist   General bed mobility comments: Assist for trunk and LEs. Increased time. Multimodal cueing required.   Transfers Overall transfer level: Needs assistance Equipment used: Rolling walker (2 wheeled) Transfers: Sit to/from Stand Sit to Stand: Mod assist;+2 physical assistance;+2 safety/equipment;From elevated surface         General transfer comment: Assist to position LEs, rise, stabilize, control descent. Multimodal cueing required.   Ambulation/Gait Ambulation/Gait assistance: Mod assist           General Gait Details: Side steps along side of bed with RW.   Stairs            Wheelchair Mobility    Modified Rankin (Stroke Patients Only)       Balance Overall balance assessment: Needs assistance;History of Falls   Sitting balance-Leahy Scale: Poor   Postural control: Posterior lean   Standing  balance-Leahy Scale: Poor                               Pertinent Vitals/Pain Pain Assessment: Faces Faces Pain Scale: Hurts little more Pain Location: generalized soreness Pain Descriptors / Indicators: Sore;Aching Pain Intervention(s): Monitored during session;Repositioned    Home Living Family/patient expects to be discharged to:: Skilled nursing facility                      Prior Function     Gait / Transfers Assistance Needed: RW for gait; wheelchair  ADL's / Homemaking Assistance Needed: nephew helps with errands and appts, food from [employer]        Hand Dominance        Extremity/Trunk Assessment   Upper Extremity Assessment Upper Extremity Assessment: Generalized weakness    Lower Extremity Assessment Lower Extremity Assessment: Generalized weakness(very stiff) RLE Deficits / Details: Knee flexion limited to ~70 degrees, full extension.     Cervical / Trunk Assessment Cervical / Trunk Assessment: Normal  Communication   Communication: No difficulties  Cognition Arousal/Alertness: Awake/alert Behavior During Therapy: WFL for tasks assessed/performed Overall Cognitive Status: History of cognitive impairments - at baseline                                        General Comments      Exercises  Assessment/Plan    PT Assessment Patient needs continued PT services  PT Problem List Decreased strength;Decreased mobility;Decreased activity tolerance;Decreased balance;Decreased range of motion;Decreased safety awareness;Pain;Decreased knowledge of use of DME;Decreased cognition       PT Treatment Interventions DME instruction;Therapeutic activities;Gait training;Therapeutic exercise;Patient/family education;Balance training;Functional mobility training    PT Goals (Current goals can be found in the Care Plan section)  Acute Rehab PT Goals Patient Stated Goal: no goats stated PT Goal Formulation: With  patient Time For Goal Achievement: 10/25/17 Potential to Achieve Goals: Fair    Frequency Min 2X/week   Barriers to discharge        Co-evaluation               AM-PAC PT "6 Clicks" Daily Activity  Outcome Measure Difficulty turning over in bed (including adjusting bedclothes, sheets and blankets)?: Unable Difficulty moving from lying on back to sitting on the side of the bed? : Unable Difficulty sitting down on and standing up from a chair with arms (e.g., wheelchair, bedside commode, etc,.)?: Unable Help needed moving to and from a bed to chair (including a wheelchair)?: A Lot Help needed walking in hospital room?: A Lot Help needed climbing 3-5 steps with a railing? : Total 6 Click Score: 8    End of Session Equipment Utilized During Treatment: Gait belt Activity Tolerance: Patient tolerated treatment well Patient left: in bed;with call bell/phone within reach;with bed alarm set;Other (comment)(telesitter)   PT Visit Diagnosis: Muscle weakness (generalized) (M62.81);Difficulty in walking, not elsewhere classified (R26.2);History of falling (Z91.81)    Time: 3419-3790 PT Time Calculation (min) (ACUTE ONLY): 24 min   Charges:   PT Evaluation $PT Eval Moderate Complexity: 1 Mod PT Treatments $Therapeutic Activity: 8-22 mins   PT G Codes:          [facility] 10/11/2017, 3:18 PM

## 2017-10-12 LAB — CREATININE, SERUM: CREATININE: 1.1 mg/dL (ref 0.61–1.24)

## 2017-10-12 NOTE — Progress Notes (Signed)
PROGRESS NOTE    Evan Charles  [id number]:[id number] DOB: Feb 18, 1946 DOA: 10/04/2017 PCP: [facility]   Brief Narrative: 71 y.o.malewith medical history significant ofdementia, cva, anemia, failure to thrive, who is brought via ems after being found by nephew who went to check on him. Found down at home incoherent, sitting in own feces. Multiple recent hospitalizations for similar episodes. Once found to have rhabdomyolysis.  Patient has no capacity to make decision and has been referred to APS.  Social worker following.  No change in clinical condition.  Continue current management.  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  Lacks capacity to make clinical decision.  Patient likely has chronic gradual decline in his mental status.  No sign of infection.  The social worker following for safe discharge planning.  Waiting guardianship hearing. -Continue Seroquel and Ativan as needed for the management of agitation  #Hypertension: Blood pressure borderline low.  I will reduce the dose of Norvasc to 5 mg and discontinue hydrochlorothiazide.  Continue Lasix.  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  No new event, lying in bed comfortable. Objective: Vitals:   10/11/17 0429 10/11/17 1314 10/11/17 2007 10/12/17 0532  BP: 99/62 95/61 97/64  125/63  Pulse: 81  80 79 82  Resp: 16 16 14 16   Temp: 98.8 F (37.1 C) (!) 97.5 F (36.4 C) 97.8 F (36.6 C) 98 F (36.7 C)  TempSrc: Oral Oral Oral Axillary  SpO2: 96% 97% 98% 99%  Weight:      Height:        Intake/Output Summary (Last 24 hours) at 10/12/2017 1149 Last data filed at 10/12/2017 0532 Gross per 24 hour  Intake 480 ml  Output 2050 ml  Net -1570 ml   Filed Weights   10/05/17 0217  Weight: 80.2 kg (176 lb 12.9 oz)    Examination:  General exam: not in distress Respiratory system: Clear to auscultation. Respiratory effort normal. No wheezing or crackle Cardiovascular system: S1 & S2 heard, RRR.  No pedal edema. Gastrointestinal system: Abdomen is nondistended, soft and nontender. Normal bowel sounds heard. Central nervous system: Alert awake and following commands Skin: No rashes, lesions or ulcers Psychiatry: Judgement and insight appear impaired    Data Reviewed: I have personally reviewed following labs and imaging studies  CBC: No results for input(s): WBC, NEUTROABS, HGB, HCT, MCV, PLT in the last 168 hours. Basic Metabolic Panel: Recent Labs  Lab 10/12/17 0426  CREATININE 1.10   GFR: Estimated Creatinine Clearance: 61.6 mL/min (by C-G formula based on SCr of 1.1 mg/dL). Liver Function Tests: No results for input(s): AST, ALT, ALKPHOS, BILITOT, PROT, ALBUMIN in the last 168 hours. No results for input(s): LIPASE, AMYLASE in the last 168 hours. No results for input(s): AMMONIA in the last 168 hours. Coagulation Profile: No results for input(s): INR, PROTIME in the last 168 hours. Cardiac Enzymes: No results for input(s): CKTOTAL, CKMB,  CKMBINDEX, TROPONINI in the last 168 hours. BNP (last 3 results) No results for input(s): PROBNP in the last 8760 hours. HbA1C: No results for input(s): HGBA1C in the last 72 hours. CBG: No results for input(s): GLUCAP in the last 168 hours. Lipid Profile: No results for input(s): CHOL, HDL, LDLCALC, TRIG, CHOLHDL, LDLDIRECT  in the last 72 hours. Thyroid Function Tests: No results for input(s): TSH, T4TOTAL, FREET4, T3FREE, THYROIDAB in the last 72 hours. Anemia Panel: No results for input(s): VITAMINB12, FOLATE, FERRITIN, TIBC, IRON, RETICCTPCT in the last 72 hours. Sepsis Labs: No results for input(s): PROCALCITON, LATICACIDVEN in the last 168 hours.  No results found for this or any previous visit (from the past 240 hour(s)).       Radiology Studies: No results found.      Scheduled Meds: . amLODipine  5 mg Oral Daily  . aspirin EC  81 mg Oral Daily  . divalproex  250 mg Oral QHS  . enoxaparin (LOVENOX) injection  40 mg Subcutaneous Q24H  . feeding supplement (ENSURE ENLIVE)  237 mL Oral BID BM  . folic acid  1 mg Oral Daily  . furosemide  80 mg Oral Daily  . metoprolol tartrate  25 mg Oral BID  . QUEtiapine  50 mg Oral QHS  . senna  1 tablet Oral QHS  . thiamine  100 mg Oral Daily   Continuous Infusions:   LOS: 7 days    Dron Tanna Furry, MD [facility] Pager [phone]  If 7PM-7AM, please contact night-coverage [email or web address] Password [email or web address] 10/12/2017, 11:49 AM

## 2017-10-13 MED ORDER — ACETAMINOPHEN 325 MG PO TABS
650.0000 mg | ORAL_TABLET | Freq: Four times a day (QID) | ORAL | Status: DC | PRN
  Administered 2017-10-13 – 2017-10-17 (×3): 650 mg via ORAL
  Filled 2017-10-13 (×3): qty 2

## 2017-10-13 NOTE — Progress Notes (Signed)
PROGRESS NOTE    Evan Charles  AOZ:[id number] DOB: 02-05-1946 DOA: 10/04/2017 PCP: [facility]   Brief Narrative: 71 y.o.malewith medical history significant ofdementia, cva, anemia, failure to thrive, who is brought via ems after being found by nephew who went to check on him. Found down at home incoherent, sitting in own feces. Multiple recent hospitalizations for similar episodes. Once found to have rhabdomyolysis.  Patient has no capacity to make decision and has been referred to APS.  Social worker following.  Patient is comfortable.  No change in clinical condition.  Follow-up social worker for safe discharge plan arrangement.  Now waiting for guardianship hearing.  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  Lacks capacity to make clinical decision.  Patient likely has chronic gradual decline in his mental status.  No sign of infection.  The social worker following for safe discharge planning.  Waiting guardianship hearing. -Continue Seroquel and Ativan as needed for the management of agitation  #Hypertension: Blood pressure acceptable now after reducing the dose of Norvasc to 5 mg and discontinued hydrochlorothiazide.  Continue Lasix.  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  No new event.  Denied any symptoms. Objective: Vitals:   10/12/17 0532 10/12/17 1331 10/12/17 2209 10/13/17 0531  BP: 125/63 96/60 112/72 104/70  Pulse: 82 74 84 80  Resp: 16 16 16 16   Temp: 98 F (36.7 C) 97.9 F (36.6 C) 98.1 F (36.7 C) 97.8 F (36.6 C)  TempSrc: Axillary Oral Oral Oral  SpO2: 99% 97% 99% 94%  Weight:      Height:        Intake/Output Summary (Last 24 hours) at 10/13/2017 1125 Last data filed at 10/13/2017 0532 Gross per 24 hour  Intake 480 ml  Output 950 ml  Net -470 ml   Filed Weights   10/05/17 0217  Weight: 80.2 kg (176 lb 12.9 oz)    Examination:  General exam: Lying in bed comfortable, not in distress Respiratory system: Clear bilateral, respiratory effort normal. Cardiovascular system: S1 & S2 heard, RRR.  No pedal edema. Gastrointestinal system: Abdomen is nondistended, soft and nontender. Normal bowel sounds heard. Central nervous system: Alert awake and following commands Skin: No rashes, lesions or ulcers Psychiatry: Judgement and insight appear impaired    Data Reviewed: I have personally reviewed following labs and imaging studies  CBC: No results for input(s): WBC, NEUTROABS, HGB, HCT, MCV, PLT in the last 168 hours. Basic Metabolic Panel: Recent Labs  Lab 10/12/17 0426  CREATININE 1.10   GFR: Estimated Creatinine Clearance: 61.6 mL/min (by C-G formula based on SCr of 1.1 mg/dL). Liver Function Tests: No results for input(s): AST, ALT, ALKPHOS, BILITOT, PROT, ALBUMIN in the last 168 hours. No results for input(s): LIPASE, AMYLASE in the last 168 hours. No results for input(s): AMMONIA in the last 168 hours. Coagulation Profile: No results for input(s): INR, PROTIME in the last  168 hours. Cardiac Enzymes: No results for input(s): CKTOTAL, CKMB, CKMBINDEX, TROPONINI in the last 168 hours. BNP (last 3 results) No results for input(s): PROBNP in the last 8760 hours. HbA1C: No results for input(s): HGBA1C in the last 72 hours. CBG: No results for input(s): GLUCAP in the last 168  hours. Lipid Profile: No results for input(s): CHOL, HDL, LDLCALC, TRIG, CHOLHDL, LDLDIRECT in the last 72 hours. Thyroid Function Tests: No results for input(s): TSH, T4TOTAL, FREET4, T3FREE, THYROIDAB in the last 72 hours. Anemia Panel: No results for input(s): VITAMINB12, FOLATE, FERRITIN, TIBC, IRON, RETICCTPCT in the last 72 hours. Sepsis Labs: No results for input(s): PROCALCITON, LATICACIDVEN in the last 168 hours.  No results found for this or any previous visit (from the past 240 hour(s)).       Radiology Studies: No results found.      Scheduled Meds: . amLODipine  5 mg Oral Daily  . aspirin EC  81 mg Oral Daily  . divalproex  250 mg Oral QHS  . enoxaparin (LOVENOX) injection  40 mg Subcutaneous Q24H  . feeding supplement (ENSURE ENLIVE)  237 mL Oral BID BM  . folic acid  1 mg Oral Daily  . furosemide  80 mg Oral Daily  . metoprolol tartrate  25 mg Oral BID  . QUEtiapine  50 mg Oral QHS  . senna  1 tablet Oral QHS  . thiamine  100 mg Oral Daily   Continuous Infusions:   LOS: 8 days    Ieisha Gao Tanna Furry, MD [facility] Pager [phone]  If 7PM-7AM, please contact night-coverage [email or web address] Password [facility] 10/13/2017, 11:25 AM

## 2017-10-14 DIAGNOSIS — F0150 Vascular dementia without behavioral disturbance: Secondary | ICD-10-CM

## 2017-10-14 LAB — VALPROIC ACID LEVEL: Valproic Acid Lvl: 22 ug/mL — ABNORMAL LOW (ref 50.0–100.0)

## 2017-10-14 MED ORDER — OXYCODONE HCL 5 MG PO TABS
2.5000 mg | ORAL_TABLET | ORAL | Status: DC | PRN
  Administered 2017-10-14 – 2017-10-24 (×5): 2.5 mg via ORAL
  Filled 2017-10-14 (×5): qty 1

## 2017-10-14 NOTE — Progress Notes (Signed)
PROGRESS NOTE    Evan Charles  JQB:[id number] DOB: [birth year] DOA: 10/04/2017 PCP: [facility]   Brief Narrative: 71 y.o.malewith medical history significant ofdementia, cva, anemia, failure to thrive, who is brought via ems after being found by nephew who went to check on him. Found down at home incoherent, sitting in own feces. Multiple recent hospitalizations for similar episodes. Once found to have rhabdomyolysis.  Patient has no capacity to make decision and has been referred to APS.  Social worker following.  Patient is comfortable.  No change in clinical condition.  Follow-up social worker for safe discharge plan arrangement.  Now waiting for guardianship hearing.    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  Lacks capacity to make clinical decision.  Patient likely has chronic gradual decline in his mental status.  No sign of infection.  The social worker following for safe discharge planning.  Waiting guardianship hearing. -Continue Seroquel and Ativan , prn haldol as needed for the management of agitation  #Hypertension: Blood pressure running low  now after reducing the dose of Norvasc to 5 mg and discontinued hydrochlorothiazide.  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[place])   Visual hallucinations   Failure to thrive in adult   Essential hypertension   Physical deconditioning   Dementia with psychosis   Stroke (cerebrum) ([place])   Dementia  DVT prophylaxis: Lovenox subcutaneous Code Status: Full code Family Communication: No family at bedside Disposition Plan: Unknown dispo plan    Consultants:   None  Procedures: None Antimicrobials: None  Subjective: Seen and examined at bedside , confused , complains of a HA     Objective: Vitals:   10/13/17 0531 10/13/17 1500 10/13/17 2007 10/14/17 0528  BP: 104/70 100/61 101/60 99/63  Pulse: 80 75 80 64  Resp: 16 16 14 12   Temp: 97.8 F (36.6 C) 98.1 F (36.7 C) 98.7 F (37.1 C) (!) 97.5 F (36.4 C)  TempSrc: Oral Oral Oral Oral  SpO2: 94% 96% 99% 98%  Weight:      Height:        Intake/Output Summary (Last 24 hours) at 10/14/2017 1040 Last data filed at 10/14/2017 0529 Gross per 24 hour  Intake 480 ml  Output 1700 ml  Net -1220 ml   Filed Weights   10/05/17 0217  Weight: 80.2 kg (176 lb 12.9 oz)    Examination:  General exam: Lying in bed comfortable, not in distress Respiratory system: Clear bilateral, respiratory effort normal. Cardiovascular system: S1 & S2 heard, RRR.  No pedal edema. Gastrointestinal system: Abdomen is nondistended, soft and nontender. Normal bowel sounds heard. Central nervous system: Alert awake and following commands Skin: No rashes, lesions or ulcers Psychiatry: Judgement and insight appear impaired    Data Reviewed: I have personally reviewed following labs and imaging studies  CBC: No results for input(s): WBC, NEUTROABS, HGB, HCT, MCV, PLT in the last 168 hours. Basic Metabolic Panel: Recent Labs  Lab 10/12/17 0426  CREATININE 1.10   GFR: Estimated Creatinine Clearance: 61.6 mL/min (by C-G formula based on SCr of 1.1 mg/dL). Liver Function Tests: No results for input(s): AST, ALT, ALKPHOS, BILITOT, PROT, ALBUMIN in the last 168 hours. No results for input(s): LIPASE, AMYLASE in the last 168 hours. No results for input(s): AMMONIA in the last 168  hours. Coagulation Profile: No results for input(s): INR, PROTIME in the last 168 hours. Cardiac Enzymes: No results for input(s): CKTOTAL, CKMB, CKMBINDEX, TROPONINI in the last 168 hours. BNP (last 3 results) No results for input(s): PROBNP in the last 8760 hours. HbA1C: No results for input(s): HGBA1C in the last 72 hours. CBG: No results for  input(s): GLUCAP in the last 168 hours. Lipid Profile: No results for input(s): CHOL, HDL, LDLCALC, TRIG, CHOLHDL, LDLDIRECT in the last 72 hours. Thyroid Function Tests: No results for input(s): TSH, T4TOTAL, FREET4, T3FREE, THYROIDAB in the last 72 hours. Anemia Panel: No results for input(s): VITAMINB12, FOLATE, FERRITIN, TIBC, IRON, RETICCTPCT in the last 72 hours. Sepsis Labs: No results for input(s): PROCALCITON, LATICACIDVEN in the last 168 hours.  No results found for this or any previous visit (from the past 240 hour(s)).       Radiology Studies: No results found.      Scheduled Meds: . amLODipine  5 mg Oral Daily  . aspirin EC  81 mg Oral Daily  . divalproex  250 mg Oral QHS  . enoxaparin (LOVENOX) injection  40 mg Subcutaneous Q24H  . feeding supplement (ENSURE ENLIVE)  237 mL Oral BID BM  . folic acid  1 mg Oral Daily  . furosemide  80 mg Oral Daily  . metoprolol tartrate  25 mg Oral BID  . QUEtiapine  50 mg Oral QHS  . senna  1 tablet Oral QHS  . thiamine  100 mg Oral Daily   Continuous Infusions:   LOS: 9 days    Reyne Dumas, MD [facility] Pager [phone]  If 7PM-7AM, please contact night-coverage [email or web address] Password [email or web address] 10/14/2017, 10:40 AM

## 2017-10-14 NOTE — Care Management Important Message (Signed)
Important Message  Patient Details  Name: Evan Charles MRN: [id number] Date of Birth: 12/09/1945   Medicare Important Message Given:  Yes    Kerin Salen 10/14/2017, 12:05 [place] Message  Patient Details  Name: Evan Charles MRN: [id number] Date of Birth: 11/21/1945   Medicare Important Message Given:  Yes    Kerin Salen 10/14/2017, 12:05 PM

## 2017-10-15 MED ORDER — FUROSEMIDE 40 MG PO TABS
40.0000 mg | ORAL_TABLET | Freq: Every day | ORAL | Status: DC
  Administered 2017-10-15 – 2017-10-24 (×10): 40 mg via ORAL
  Filled 2017-10-15 (×9): qty 1

## 2017-10-15 NOTE — Progress Notes (Signed)
Physical Therapy Treatment Patient Details Name: Evan Charles MRN: [id number] DOB: Jun 06, 1946 Today's Date: 10/15/2017    History of Present Illness 71 yo male admitted with FTT, fall at home, weakness. Hx of Sz, spinal stenosis, dementia, CVA, neuropathy, anemia    PT Comments    Patient improved over last session with ability to stand from bed and to walk with walker and assist.  Feel patient remains appropriate for SNF level rehab at d/c.  PT to follow acutely.    Follow Up Recommendations  SNF     Equipment Recommendations  None recommended by PT    Recommendations for Other Services       Precautions / Restrictions Precautions Precautions: Fall    Mobility  Bed Mobility Overal bed mobility: Needs Assistance   Rolling: Supervision   Supine to sit: Supervision;HOB elevated     General bed mobility comments: cues for technique, assist for safety  Transfers Overall transfer level: Needs assistance Equipment used: Rolling walker (2 wheeled) Transfers: Sit to/from Stand Sit to Stand: Mod assist;+2 safety/equipment Stand pivot transfers: +2 safety/equipment       General transfer comment: assist for knee flexion, to lift into standng and for anterior weight shift to prevent LOB posterior  Ambulation/Gait Ambulation/Gait assistance: Mod assist;+2 safety/equipment Ambulation Distance (Feet): 70 Feet Assistive device: Rolling walker (2 wheeled) Gait Pattern/deviations: Decreased stride length;Narrow base of support;Trunk flexed     General Gait Details: mod cues and facilitation for upright posture, slow pace and shuffling steps   Stairs            Wheelchair Mobility    Modified Rankin (Stroke Patients Only)       Balance Overall balance assessment: Needs assistance;History of Falls Sitting-balance support: No upper extremity supported;Feet supported Sitting balance-Leahy Scale: Fair     Standing balance support: Bilateral upper extremity  supported Standing balance-Leahy Scale: Poor Standing balance comment: posterior lean with RW                            Cognition Arousal/Alertness: Awake/alert Behavior During Therapy: WFL for tasks assessed/performed Overall Cognitive Status: History of cognitive impairments - at baseline                   Orientation Level: Disoriented to;Situation Current Attention Level: Focused   Following Commands: Follows one step commands consistently Safety/Judgement: Decreased awareness of safety;Decreased awareness of deficits   Problem Solving: Slow processing;Requires verbal cues        Exercises General Exercises - Lower Extremity Ankle Circles/Pumps: AROM;10 reps;Both;Supine Heel Slides: AROM;Supine;Both;10 reps    General Comments        Pertinent Vitals/Pain Faces Pain Scale: Hurts little more Pain Location: head, neck and back Pain Descriptors / Indicators: Aching Pain Intervention(s): Repositioned;Monitored during session    Home Living                      Prior Function            PT Goals (current goals can now be found in the care plan section) Progress towards PT goals: Progressing toward goals    Frequency           PT Plan Current plan remains appropriate    Co-evaluation              AM-PAC PT "6 Clicks" Daily Activity  Outcome Measure  Difficulty turning over in bed (including adjusting bedclothes,  sheets and blankets)?: Unable Difficulty moving from lying on back to sitting on the side of the bed? : Unable Difficulty sitting down on and standing up from a chair with arms (e.g., wheelchair, bedside commode, etc,.)?: Unable Help needed moving to and from a bed to chair (including a wheelchair)?: A Lot Help needed walking in hospital room?: A Lot Help needed climbing 3-5 steps with a railing? : Total 6 Click Score: 8    End of Session Equipment Utilized During Treatment: Gait belt Activity Tolerance:  Patient tolerated treatment well Patient left: in chair;with chair alarm set;with nursing/sitter in room;with call bell/phone within reach   PT Visit Diagnosis: Muscle weakness (generalized) (M62.81);Difficulty in walking, not elsewhere classified (R26.2);History of falling (Z91.81)     Time: 1412-1440 PT Time Calculation (min) (ACUTE ONLY): 28 min  Charges:  $Gait Training: 8-22 mins $Therapeutic Activity: 8-22 mins                    G CodesMagda Kiel, [place] [phone] 10/15/2017    Reginia Naas 10/15/2017, 2:57 PM

## 2017-10-15 NOTE — Progress Notes (Signed)
CSW spoke with Tia w/ APS. She reports the court date has not been scheduled for Guardianship hearing. She will discuss with her Leadership about filing approval paperwork- for a court petition today.  She will inform CSW later today.  Patient cannot d/c until a court date has been scheduled.   Kathrin Greathouse, Latanya Presser, MSW Clinical Social Worker  [phone] 10/15/2017  8:57 AM

## 2017-10-15 NOTE — Progress Notes (Signed)
PROGRESS NOTE    Evan Charles  HEN:[id number] DOB: July 18, 1946 DOA: 10/04/2017 PCP: [facility]   Brief Narrative: 71 y.o.malewith medical history significant ofdementia, cva, anemia, failure to thrive, who is brought via ems after being found by nephew who went to check on him. Found down at home incoherent, sitting in own feces. Multiple recent hospitalizations for similar episodes. Once found to have rhabdomyolysis.  Patient has no capacity to make decision and has been referred to APS.  Social worker following.  Patient is comfortable.  No change in clinical condition.  Follow-up social worker for safe discharge plan arrangement.  Now waiting for guardianship hearing.    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  Lacks capacity to make clinical decision.  Patient likely has chronic gradual decline in his mental status.  No sign of infection.  The social worker following for safe discharge planning.  Waiting guardianship hearing. According to the social worker patient does not have a court date. -Continue Seroquel and Ativan , prn haldol as needed for the management of agitation  #Hypertension: Blood pressure running low  now after reducing the dose of Norvasc to 5 mg and stopping hydrochlorothiazide.  Discontinued Norvasc . 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[place])   Visual hallucinations   Failure to thrive in adult   Essential hypertension   Physical deconditioning   Dementia with psychosis   Stroke (cerebrum) ([place])   Dementia  DVT prophylaxis: Lovenox subcutaneous Code Status: Full code Family Communication: No family at bedside Disposition Plan: Unknown dispo plan    Consultants:   None  Procedures:  None Antimicrobials: None  Subjective: Patient with a telemetry setting of the room, does not answer questions   Objective: Vitals:   10/14/17 0528 10/14/17 1427 10/14/17 2209 10/15/17 0630  BP: 99/63 121/72 99/63 135/74  Pulse: 64 77 77 72  Resp: 12 16 17 18   Temp: (!) 97.5 F (36.4 C) 98.1 F (36.7 C) 98.1 F (36.7 C) (!) 97.4 F (36.3 C)  TempSrc: Oral Oral Oral Oral  SpO2: 98% 97% 100% 98%  Weight:      Height:        Intake/Output Summary (Last 24 hours) at 10/15/2017 1157 Last data filed at 10/15/2017 1036 Gross per 24 hour  Intake 1410 ml  Output 645 ml  Net 765 ml   Filed Weights   10/05/17 0217  Weight: 80.2 kg (176 lb 12.9 oz)    Examination:  General exam: Lying in bed comfortable, not in distress Respiratory system: Clear bilateral, respiratory effort normal. Cardiovascular system: S1 & S2 heard, RRR.  No pedal edema. Gastrointestinal system: Abdomen is nondistended, soft and nontender. Normal bowel sounds heard. Central nervous system: Alert awake and following commands Skin: No rashes, lesions or ulcers Psychiatry: Judgement and insight appear impaired    Data Reviewed: I have personally reviewed following labs and imaging studies  CBC: No results for input(s): WBC, NEUTROABS, HGB, HCT, MCV, PLT in the last 168 hours. Basic Metabolic Panel: Recent Labs  Lab 10/12/17 0426  CREATININE 1.10   GFR: Estimated Creatinine Clearance: 61.6 mL/min (by C-G formula based on SCr of 1.1 mg/dL). Liver Function Tests: No results for input(s): AST, ALT, ALKPHOS, BILITOT, PROT, ALBUMIN in the last 168  hours. No results for input(s): LIPASE, AMYLASE in the last 168 hours. No results for input(s): AMMONIA in the last 168 hours. Coagulation Profile: No results for input(s): INR, PROTIME in the last 168 hours. Cardiac Enzymes: No results for input(s): CKTOTAL, CKMB, CKMBINDEX, TROPONINI in the last 168 hours. BNP (last 3 results) No results for input(s):  PROBNP in the last 8760 hours. HbA1C: No results for input(s): HGBA1C in the last 72 hours. CBG: No results for input(s): GLUCAP in the last 168 hours. Lipid Profile: No results for input(s): CHOL, HDL, LDLCALC, TRIG, CHOLHDL, LDLDIRECT in the last 72 hours. Thyroid Function Tests: No results for input(s): TSH, T4TOTAL, FREET4, T3FREE, THYROIDAB in the last 72 hours. Anemia Panel: No results for input(s): VITAMINB12, FOLATE, FERRITIN, TIBC, IRON, RETICCTPCT in the last 72 hours. Sepsis Labs: No results for input(s): PROCALCITON, LATICACIDVEN in the last 168 hours.  No results found for this or any previous visit (from the past 240 hour(s)).       Radiology Studies: No results found.      Scheduled Meds: . aspirin EC  81 mg Oral Daily  . divalproex  250 mg Oral QHS  . enoxaparin (LOVENOX) injection  40 mg Subcutaneous Q24H  . feeding supplement (ENSURE ENLIVE)  237 mL Oral BID BM  . folic acid  1 mg Oral Daily  . furosemide  80 mg Oral Daily  . metoprolol tartrate  25 mg Oral BID  . QUEtiapine  50 mg Oral QHS  . senna  1 tablet Oral QHS  . thiamine  100 mg Oral Daily   Continuous Infusions:   LOS: 10 days    Reyne Dumas, MD [facility] Pager [phone]  If 7PM-7AM, please contact night-coverage [email or web address] Password [email or web address] 10/15/2017, 11:57 AM

## 2017-10-16 LAB — BASIC METABOLIC PANEL
ANION GAP: 11 (ref 5–15)
BUN: 28 mg/dL — AB (ref 6–20)
CALCIUM: 9 mg/dL (ref 8.9–10.3)
CO2: 34 mmol/L — AB (ref 22–32)
Chloride: 90 mmol/L — ABNORMAL LOW (ref 101–111)
Creatinine, Ser: 0.9 mg/dL (ref 0.61–1.24)
GFR calc Af Amer: >60 mL/min (ref >60)
GLUCOSE: 100 mg/dL — AB (ref 65–99)
Potassium: 3.9 mmol/L (ref 3.5–5.1)
Sodium: 135 mmol/L (ref 135–145)

## 2017-10-16 NOTE — Progress Notes (Signed)
PROGRESS NOTE    Evan Charles  FBP:[id number] DOB: Oct 14, 1946 DOA: 10/04/2017 PCP: [facility]   Brief Narrative: 71 y.o.malewith medical history significant ofdementia, cva, anemia, failure to thrive, who is brought via ems after being found by nephew who went to check on him. Found down at home incoherent, sitting in own feces. Multiple recent hospitalizations for similar episodes. Once found to have rhabdomyolysis.  Patient has no capacity to make decision and has been referred to APS.  Social worker following.  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  Lacks capacity to make clinical decision.  Patient likely has chronic gradual decline in his mental status.  No sign of infection.  The social worker following for safe discharge planning.  Waiting guardianship hearing. -Continue Seroquel and Ativan as needed for the management of agitation  #Hypertension: Blood pressure acceptable.  Norvasc discontinued.  Currently on metoprolol and Lasix 40 mg.  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  No new event. Objective: Vitals:   10/15/17 0630 10/15/17 1755 10/15/17 2200 10/16/17 0605  BP: 135/74 122/72 (!) 145/124 137/84  Pulse: 72 75 (!) 113 94  Resp: 18 17 19 18   Temp: (!) 97.4 F (36.3 C) 98.2 F (36.8 C) 98.1 F (36.7 C) 98 F (36.7 C)    TempSrc: Oral Oral Oral Oral  SpO2: 98% 100% 100% 100%  Weight:      Height:        Intake/Output Summary (Last 24 hours) at 10/16/2017 1533 Last data filed at 10/15/2017 2200 Gross per 24 hour  Intake 930 ml  Output 100 ml  Net 830 ml   Filed Weights   10/05/17 0217  Weight: 80.2 kg (176 lb 12.9 oz)    Examination:  General exam: Lying in bed comfortable, not in distress Respiratory system: Clear bilateral, respiratory for normal Cardiovascular system: Regular rate rhythm S1-S2 normal.  No pedal edema. Gastrointestinal system: Abdomen is nondistended, soft and nontender. Normal bowel sounds heard. Central nervous system: Alert awake and following commands Skin: No rashes, lesions or ulcers Psychiatry: Judgement and insight appear impaired    Data Reviewed: I have personally reviewed following labs and imaging studies  CBC: No results for input(s): WBC, NEUTROABS, HGB, HCT, MCV, PLT in the last 168 hours. Basic Metabolic Panel: Recent Labs  Lab 10/12/17 0426 10/16/17 0346  NA  --  135  K  --  3.9  CL  --  90*  CO2  --  34*  GLUCOSE  --  100*  BUN  --  28*  CREATININE 1.10 0.90  CALCIUM  --  9.0   GFR: Estimated Creatinine Clearance: 75.3 mL/min (by C-G formula based on SCr of 0.9 mg/dL). Liver Function Tests: No results for input(s): AST, ALT, ALKPHOS, BILITOT, PROT, ALBUMIN in the last 168 hours. No results for input(s): LIPASE, AMYLASE in the last 168 hours. No results for input(s): AMMONIA  in the last 168 hours. Coagulation Profile: No results for input(s): INR, PROTIME in the last 168 hours. Cardiac Enzymes: No results for input(s): CKTOTAL, CKMB, CKMBINDEX, TROPONINI in the last 168 hours. BNP (last 3 results) No results for input(s): PROBNP in the last 8760 hours. HbA1C: No results for input(s): HGBA1C in the last 72 hours. CBG: No results for input(s): GLUCAP in the last 168 hours. Lipid Profile: No results for input(s): CHOL, HDL, LDLCALC,  TRIG, CHOLHDL, LDLDIRECT in the last 72 hours. Thyroid Function Tests: No results for input(s): TSH, T4TOTAL, FREET4, T3FREE, THYROIDAB in the last 72 hours. Anemia Panel: No results for input(s): VITAMINB12, FOLATE, FERRITIN, TIBC, IRON, RETICCTPCT in the last 72 hours. Sepsis Labs: No results for input(s): PROCALCITON, LATICACIDVEN in the last 168 hours.  No results found for this or any previous visit (from the past 240 hour(s)).       Radiology Studies: No results found.      Scheduled Meds: . aspirin EC  81 mg Oral Daily  . divalproex  250 mg Oral QHS  . enoxaparin (LOVENOX) injection  40 mg Subcutaneous Q24H  . feeding supplement (ENSURE ENLIVE)  237 mL Oral BID BM  . folic acid  1 mg Oral Daily  . furosemide  40 mg Oral Daily  . metoprolol tartrate  25 mg Oral BID  . QUEtiapine  50 mg Oral QHS  . senna  1 tablet Oral QHS  . thiamine  100 mg Oral Daily   Continuous Infusions:   LOS: 11 days    Dron Tanna Furry, MD [facility] Pager [phone]  If 7PM-7AM, please contact night-coverage [email or web address] Password [facility] 10/16/2017, 3:33 PM

## 2017-10-17 NOTE — Care Management Important Message (Signed)
Important Message  Patient Details  Name: Evan Charles MRN: [id number] Date of Birth: August 04, 1946   Medicare Important Message Given:  Yes    Kerin Salen 10/17/2017, 11:28 AMImportant Message  Patient Details  Name: Evan Charles MRN: [id number] Date of Birth: Oct 26, 1946   Medicare Important Message Given:  Yes    Kerin Salen 10/17/2017, 11:28 AM

## 2017-10-17 NOTE — Progress Notes (Addendum)
Physical Therapy Treatment Patient Details Name: Evan Charles MRN: [id number] DOB: 10-Apr-1946 Today's Date: 10/17/2017    History of Present Illness 71 yo male admitted with FTT, fall at home, weakness. Hx of Sz, spinal stenosis, dementia, CVA, neuropathy, anemia    PT Comments    The patient  Was quite animated today. Ambulated x 120' x 2 with 2 assist for safety and balance.  Continue PT.   Follow Up Recommendations  SNF     Equipment Recommendations  None recommended by PT    Recommendations for Other Services       Precautions / Restrictions Precautions Precautions: Fall    Mobility  Bed Mobility               General bed mobility comments: in recliner  Transfers Overall transfer level: Needs assistance Equipment used: Rolling walker (2 wheeled) Transfers: Sit to/from Stand Sit to Stand: Mod assist;+2 safety/equipment         General transfer comment: assist to power up, did better second trial. Feet slide forward on socks  Ambulation/Gait Ambulation/Gait assistance: Mod assist;+2 safety/equipment Ambulation Distance (Feet): 120 Feet(x 2) Assistive device: Rolling walker (2 wheeled) Gait Pattern/deviations: Decreased stride length;Narrow base of support;Trunk flexed Gait velocity: decreased Gait velocity interpretation: <1.8 ft/sec, indicative of risk for recurrent falls General Gait Details: tends to weight bear on lateral left foot. States"I have a callous". Intermittent steady assist for balance. Cues for safe turning and backing up to recliner.   Stairs            Wheelchair Mobility    Modified Rankin (Stroke Patients Only)       Balance   Sitting-balance support: No upper extremity supported;Feet supported Sitting balance-Leahy Scale: Fair     Standing balance support: Bilateral upper extremity supported Standing balance-Leahy Scale: Poor Standing balance comment: posterior lean with RW                             Cognition Arousal/Alertness: Awake/alert Behavior During Therapy: WFL for tasks assessed/performed Overall Cognitive Status: History of cognitive impairments - at baseline                     Current Attention Level: Focused   Following Commands: Follows one step commands consistently Safety/Judgement: Decreased awareness of safety;Decreased awareness of deficits   Problem Solving: Slow processing;Requires verbal cues        Exercises  performed basic U/LE   active ROM to warm up.    General Comments        Pertinent Vitals/Pain Pain Score: 0-No pain    Home Living                      Prior Function            PT Goals (current goals can now be found in the care plan section) Progress towards PT goals: Progressing toward goals    Frequency    Min 2X/week      PT Plan Current plan remains appropriate    Co-evaluation              AM-PAC PT "6 Clicks" Daily Activity  Outcome Measure  Difficulty turning over in bed (including adjusting bedclothes, sheets and blankets)?: Unable Difficulty moving from lying on back to sitting on the side of the bed? : Unable Difficulty sitting down on and standing up from a chair with arms (  e.g., wheelchair, bedside commode, etc,.)?: Unable Help needed moving to and from a bed to chair (including a wheelchair)?: A Lot Help needed walking in hospital room?: A Lot Help needed climbing 3-5 steps with a railing? : Total 6 Click Score: 8    End of Session Equipment Utilized During Treatment: Gait belt Activity Tolerance: Patient tolerated treatment well Patient left: in chair;with chair alarm set;with call bell/phone within reach Nurse Communication: Mobility status PT Visit Diagnosis: Muscle weakness (generalized) (M62.81);Difficulty in walking, not elsewhere classified (R26.2);History of falling (Z91.81)     Time: 0071-2197 PT Time Calculation (min) (ACUTE ONLY): 26 min  Charges:  $Gait  Training: 23-37 mins                    G CodesTresa Endo PT [phone]    [name] 10/17/2017, 4:14 PM

## 2017-10-17 NOTE — Progress Notes (Signed)
PROGRESS NOTE    Evan Charles  DXA:[id number] DOB: 05-11-1946 DOA: 10/04/2017 PCP: [facility]   Brief Narrative: 71 y.o.malewith medical history significant ofdementia, cva, anemia, failure to thrive, who is brought via ems after being found by nephew who went to check on him. Found down at home incoherent, sitting in own feces. Multiple recent hospitalizations for similar episodes. Once found to have rhabdomyolysis.  Patient has no capacity to make decision and has been referred to APS.  Social worker following.  Clinically stable, no change in plan.  Continue to provide current treatment.  Social worker evaluation ongoing.  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  Lacks capacity to make clinical decision.  Patient likely has chronic gradual decline in his mental status.  No sign of infection.  The social worker following for safe discharge planning.  Waiting guardianship hearing. -Continue Seroquel and Ativan as needed for the management of agitation  #Hypertension: Blood pressure acceptable.  Norvasc discontinued.  Currently on metoprolol and Lasix 40 mg.  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  No new event.  Denies any needs. Objective: Vitals:   10/15/17 2200 10/16/17 0605 10/16/17 1946 10/17/17 0517  BP: (!) 145/124 137/84 99/70 98/67    Pulse: (!) 113 94 88 87  Resp: 19 18 16 16   Temp: 98.1 F (36.7 C) 98 F (36.7 C) 98.2 F (36.8 C) 98 F (36.7 C)  TempSrc: Oral Oral Oral Oral  SpO2: 100% 100% 97% 94%  Weight:      Height:        Intake/Output Summary (Last 24 hours) at 10/17/2017 1134 Last data filed at 10/17/2017 1000 Gross per 24 hour  Intake 1905 ml  Output 800 ml  Net 1105 ml   Filed Weights   10/05/17 0217  Weight: 80.2 kg (176 lb 12.9 oz)    Examination:  General exam: Lying on bed comfortable, not in distress Respiratory system: Clear bilateral, respiratory for normal Cardiovascular system: Regular rate rhythm S1-S2 normal.  No pedal edema. Gastrointestinal system: Abdomen is nondistended, soft and nontender. Normal bowel sounds heard. Central nervous system: Alert awake and following commands Skin: No rashes, lesions or ulcers Psychiatry: Judgement and insight appear impaired    Data Reviewed: I have personally reviewed following labs and imaging studies  CBC: No results for input(s): WBC, NEUTROABS, HGB, HCT, MCV, PLT in the last 168 hours. Basic Metabolic Panel: Recent Labs  Lab 10/12/17 0426 10/16/17 0346  NA  --  135  K  --  3.9  CL  --  90*  CO2  --  34*  GLUCOSE  --  100*  BUN  --  28*  CREATININE 1.10 0.90  CALCIUM  --  9.0   GFR: Estimated Creatinine Clearance: 75.3 mL/min (by C-G formula based on SCr of 0.9 mg/dL). Liver Function Tests: No results for input(s): AST, ALT, ALKPHOS, BILITOT, PROT, ALBUMIN in  the last 168 hours. No results for input(s): LIPASE, AMYLASE in the last 168 hours. No results for input(s): AMMONIA in the last 168 hours. Coagulation Profile: No results for input(s): INR, PROTIME in the last 168 hours. Cardiac Enzymes: No results for input(s): CKTOTAL, CKMB, CKMBINDEX, TROPONINI in the last 168 hours. BNP (last 3 results) No results for input(s): PROBNP in the last 8760 hours. HbA1C: No results for input(s): HGBA1C in the last 72  hours. CBG: No results for input(s): GLUCAP in the last 168 hours. Lipid Profile: No results for input(s): CHOL, HDL, LDLCALC, TRIG, CHOLHDL, LDLDIRECT in the last 72 hours. Thyroid Function Tests: No results for input(s): TSH, T4TOTAL, FREET4, T3FREE, THYROIDAB in the last 72 hours. Anemia Panel: No results for input(s): VITAMINB12, FOLATE, FERRITIN, TIBC, IRON, RETICCTPCT in the last 72 hours. Sepsis Labs: No results for input(s): PROCALCITON, LATICACIDVEN in the last 168 hours.  No results found for this or any previous visit (from the past 240 hour(s)).       Radiology Studies: No results found.      Scheduled Meds: . aspirin EC  81 mg Oral Daily  . divalproex  250 mg Oral QHS  . enoxaparin (LOVENOX) injection  40 mg Subcutaneous Q24H  . feeding supplement (ENSURE ENLIVE)  237 mL Oral BID BM  . folic acid  1 mg Oral Daily  . furosemide  40 mg Oral Daily  . metoprolol tartrate  25 mg Oral BID  . QUEtiapine  50 mg Oral QHS  . senna  1 tablet Oral QHS  . thiamine  100 mg Oral Daily   Continuous Infusions:   LOS: 12 days    Mardelle Pandolfi Tanna Furry, MD [facility] Pager [phone]  If 7PM-7AM, please contact night-coverage [email or web address] Password [email or web address] 10/17/2017, 11:34 AM

## 2017-10-17 NOTE — Progress Notes (Signed)
CSW following for disposition. Complex case- see past notes. Per pt's DSS worker, a court hearing for guardianship/competency should be scheduled later today and she will advise CSW.  Anticipating SNF placement for pt once guardianship issues resolved.  Sharren Bridge, MSW, LCSW Clinical Social Work 10/17/2017 [phone]

## 2017-10-18 NOTE — Progress Notes (Signed)
Evan Charles  OMV:[id number] DOB: Jan 06, 1946 DOA: 10/04/2017 PCP: [facility]   Brief Narrative: 71 y.o.malewith medical history significant ofdementia, cva, anemia, failure to thrive, who is brought via ems after being found by nephew who went to check on him. Found down at home incoherent, sitting in own feces. Multiple recent hospitalizations for similar episodes. Once found to have rhabdomyolysis.  Patient has no capacity to make decision and has been referred to APS.  Social worker following.  Clinically stable, no change in plan.  Continue current medical and supportive care.    Social worker evaluation ongoing.  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  Lacks capacity to make clinical decision.  Patient likely has chronic gradual decline in his mental status.  No sign of infection.  The social worker following for safe discharge planning.  Waiting guardianship hearing. -Continue Seroquel and Ativan as needed for the management of agitation  #Hypertension: Blood pressure acceptable.  Norvasc discontinued.  Currently on metoprolol and Lasix 40 mg.  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  New event, lying in bed comfortable, not in distress. Objective: Vitals:   10/17/17 0517 10/17/17 1441 10/17/17 2029 10/18/17 0443  BP:  98/67 113/77 115/82 127/69  Pulse: 87 77 84 79  Resp: 16 18 16 16   Temp: 98 F (36.7 C) 98.2 F (36.8 C) 98 F (36.7 C) 98 F (36.7 C)  TempSrc: Oral Oral Oral Oral  SpO2: 94% 100% 98% 98%  Weight:      Height:        Intake/Output Summary (Last 24 hours) at 10/18/2017 1251 Last data filed at 10/18/2017 1023 Gross per 24 hour  Intake 597 ml  Output 2300 ml  Net -1703 ml   Filed Weights   10/05/17 0217  Weight: 80.2 kg (176 lb 12.9 oz)    Examination:  General exam: Not in distress Respiratory system: Clear bilateral, respiratory for normal Cardiovascular system: Regular rate rhythm S1-S2 normal.  No pedal edema. Gastrointestinal system: Abdomen is nondistended, soft and nontender. Normal bowel sounds heard. Central nervous system: Alert awake and following commands Skin: No rashes, lesions or ulcers Psychiatry: Judgement and insight appear impaired    Data Reviewed: I have personally reviewed following labs and imaging studies  CBC: No results for input(s): WBC, NEUTROABS, HGB, HCT, MCV, PLT in the last 168 hours. Basic Metabolic Panel: Recent Labs  Lab 10/12/17 0426 10/16/17 0346  NA  --  135  K  --  3.9  CL  --  90*  CO2  --  34*  GLUCOSE  --  100*  BUN  --  28*  CREATININE 1.10 0.90  CALCIUM  --  9.0   GFR: Estimated Creatinine Clearance: 75.3 mL/min (by C-G formula based on SCr of 0.9 mg/dL). Liver Function Tests: No results for input(s): AST, ALT, ALKPHOS, BILITOT, PROT, ALBUMIN in the  last 168 hours. No results for input(s): LIPASE, AMYLASE in the last 168 hours. No results for input(s): AMMONIA in the last 168 hours. Coagulation Profile: No results for input(s): INR, PROTIME in the last 168 hours. Cardiac Enzymes: No results for input(s): CKTOTAL, CKMB, CKMBINDEX, TROPONINI in the last 168 hours. BNP (last 3 results) No results for input(s): PROBNP in the last 8760 hours. HbA1C: No results for input(s): HGBA1C in the last 72 hours. CBG: No  results for input(s): GLUCAP in the last 168 hours. Lipid Profile: No results for input(s): CHOL, HDL, LDLCALC, TRIG, CHOLHDL, LDLDIRECT in the last 72 hours. Thyroid Function Tests: No results for input(s): TSH, T4TOTAL, FREET4, T3FREE, THYROIDAB in the last 72 hours. Anemia Panel: No results for input(s): VITAMINB12, FOLATE, FERRITIN, TIBC, IRON, RETICCTPCT in the last 72 hours. Sepsis Labs: No results for input(s): PROCALCITON, LATICACIDVEN in the last 168 hours.  No results found for this or any previous visit (from the past 240 hour(s)).       Radiology Studies: No results found.      Scheduled Meds: . aspirin EC  81 mg Oral Daily  . divalproex  250 mg Oral QHS  . enoxaparin (LOVENOX) injection  40 mg Subcutaneous Q24H  . feeding supplement (ENSURE ENLIVE)  237 mL Oral BID BM  . folic acid  1 mg Oral Daily  . furosemide  40 mg Oral Daily  . metoprolol tartrate  25 mg Oral BID  . QUEtiapine  50 mg Oral QHS  . senna  1 tablet Oral QHS  . thiamine  100 mg Oral Daily   Continuous Infusions:   LOS: 13 days    Braydn Carneiro Tanna Furry, MD [facility] Pager [phone]  If 7PM-7AM, please contact night-coverage [email or web address] Password [email or web address] 10/18/2017, 12:51 PM

## 2017-10-19 LAB — CREATININE, SERUM
CREATININE: 0.93 mg/dL (ref 0.61–1.24)
GFR calc Af Amer: >60 mL/min (ref >60)

## 2017-10-19 NOTE — Progress Notes (Signed)
PROGRESS NOTE    Evan Charles  FYB:[id number] DOB: Jul 05, 1946 DOA: 10/04/2017 PCP: [facility]   Brief Narrative: 71 y.o.malewith medical history significant ofdementia, cva, anemia, failure to thrive, who is brought via ems after being found by nephew who went to check on him. Found down at home incoherent, sitting in own feces. Multiple recent hospitalizations for similar episodes. Once found to have rhabdomyolysis.  Patient has no capacity to make decision and has been referred to APS.  Social worker following.  Clinically stable, no change in plan.  Continue current medical and supportive care..   Social worker evaluation ongoing.  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  Lacks capacity to make clinical decision.  Patient likely has chronic gradual decline in his mental status.  No sign of infection.  The social worker following for safe discharge planning.  Waiting guardianship hearing. -Continue Seroquel and Ativan as needed for the management of agitation  #Hypertension: Blood pressure acceptable.  Norvasc discontinued.  Currently on metoprolol and Lasix 40 mg.  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  New event, lying in bed comfortable, not in distress. Objective: Vitals:   10/18/17 0443 10/18/17 1430 10/18/17 2000 10/19/17 0635  BP:  127/69 120/69 (!) 98/58 105/67  Pulse: 79 75 79 80  Resp: 16 18 16 14   Temp: 98 F (36.7 C) 98 F (36.7 C) 98 F (36.7 C) 98 F (36.7 C)  TempSrc: Oral Oral Oral Oral  SpO2: 98% 98% 97% 99%  Weight:      Height:        Intake/Output Summary (Last 24 hours) at 10/19/2017 1050 Last data filed at 10/19/2017 0843 Gross per 24 hour  Intake 1075 ml  Output 400 ml  Net 675 ml   Filed Weights   10/05/17 0217  Weight: 80.2 kg (176 lb 12.9 oz)    Examination:  General exam: Lying on bed not distress Respiratory system: Clear bilateral, respiratory for normal Cardiovascular system: Regular rate rhythm S1-S2 normal.  No pedal edema. Gastrointestinal system: Abdomen is nondistended, soft and nontender. Normal bowel sounds heard. Central nervous system: Alert awake and following commands Skin: No rashes, lesions or ulcers Psychiatry: Judgement and insight appear impaired    Data Reviewed: I have personally reviewed following labs and imaging studies  CBC: No results for input(s): WBC, NEUTROABS, HGB, HCT, MCV, PLT in the last 168 hours. Basic Metabolic Panel: Recent Labs  Lab 10/16/17 0346 10/19/17 0401  NA 135  --   K 3.9  --   CL 90*  --   CO2 34*  --   GLUCOSE 100*  --   BUN 28*  --   CREATININE 0.90 0.93  CALCIUM 9.0  --    GFR: Estimated Creatinine Clearance: 72.9 mL/min (by C-G formula based on SCr of 0.93 mg/dL). Liver Function Tests: No results for input(s): AST, ALT, ALKPHOS, BILITOT, PROT, ALBUMIN  in the last 168 hours. No results for input(s): LIPASE, AMYLASE in the last 168 hours. No results for input(s): AMMONIA in the last 168 hours. Coagulation Profile: No results for input(s): INR, PROTIME in the last 168 hours. Cardiac Enzymes: No results for input(s): CKTOTAL, CKMB, CKMBINDEX, TROPONINI in the last 168 hours. BNP (last 3 results) No results for input(s): PROBNP in the last 8760 hours. HbA1C: No results for input(s): HGBA1C in the last 72  hours. CBG: No results for input(s): GLUCAP in the last 168 hours. Lipid Profile: No results for input(s): CHOL, HDL, LDLCALC, TRIG, CHOLHDL, LDLDIRECT in the last 72 hours. Thyroid Function Tests: No results for input(s): TSH, T4TOTAL, FREET4, T3FREE, THYROIDAB in the last 72 hours. Anemia Panel: No results for input(s): VITAMINB12, FOLATE, FERRITIN, TIBC, IRON, RETICCTPCT in the last 72 hours. Sepsis Labs: No results for input(s): PROCALCITON, LATICACIDVEN in the last 168 hours.  No results found for this or any previous visit (from the past 240 hour(s)).       Radiology Studies: No results found.      Scheduled Meds: . aspirin EC  81 mg Oral Daily  . divalproex  250 mg Oral QHS  . enoxaparin (LOVENOX) injection  40 mg Subcutaneous Q24H  . feeding supplement (ENSURE ENLIVE)  237 mL Oral BID BM  . folic acid  1 mg Oral Daily  . furosemide  40 mg Oral Daily  . metoprolol tartrate  25 mg Oral BID  . QUEtiapine  50 mg Oral QHS  . senna  1 tablet Oral QHS  . thiamine  100 mg Oral Daily   Continuous Infusions:   LOS: 14 days    Marquez Ceesay Tanna Furry, MD [facility] Pager [phone]  If 7PM-7AM, please contact night-coverage [email or web address] Password [email or web address] 10/19/2017, 10:50 AM

## 2017-10-20 NOTE — Progress Notes (Signed)
PROGRESS NOTE    Evan Charles  KWI:[id number] DOB: May 31, 1946 DOA: 10/04/2017 PCP: [facility]   Brief Narrative: 71 y.o.malewith medical history significant ofdementia, cva, anemia, failure to thrive, who is brought via ems after being found by nephew who went to check on him. Found down at home incoherent, sitting in own feces. Multiple recent hospitalizations for similar episodes. Once found to have rhabdomyolysis.  Patient has no capacity to make decision and has been referred to APS.  Social worker following.  No new event.  Clinically stable.  Continue current medical and supportive care.  Social worker evaluation ongoing.  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  Lacks capacity to make clinical decision.  Patient likely has chronic gradual decline in his mental status.  No sign of infection.  The social worker following for safe discharge planning.  Waiting guardianship hearing. -Continue Seroquel and Ativan as needed for the management of agitation  #Hypertension: Blood pressure acceptable.  Norvasc discontinued.  Currently on metoprolol and Lasix 40 mg.  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  No new event Objective: Vitals:   10/19/17 0635 10/19/17 1250 10/19/17 1937 10/20/17 0557  BP: 105/67 116/71 110/65 101/66  Pulse: 80 66 83  79  Resp: 14 15 16 16   Temp: 98 F (36.7 C) 98.3 F (36.8 C) 98.1 F (36.7 C) 98 F (36.7 C)  TempSrc: Oral Oral Oral Oral  SpO2: 99% 99% 99% 99%  Weight:      Height:        Intake/Output Summary (Last 24 hours) at 10/20/2017 1206 Last data filed at 10/20/2017 2426 Gross per 24 hour  Intake 1075 ml  Output 1300 ml  Net -225 ml   Filed Weights   10/05/17 0217  Weight: 80.2 kg (176 lb 12.9 oz)    Examination:  General exam: Lying in bed comfortable, not in distress Respiratory system: Clear bilateral, respiratory for normal Cardiovascular system: Regular rate rhythm S1-S2 normal.  No pedal edema. Gastrointestinal system: Abdomen is nondistended, soft and nontender. Normal bowel sounds heard. Central nervous system: Alert awake and following commands Skin: No rashes, lesions or ulcers Psychiatry: Judgement and insight appear impaired    Data Reviewed: I have personally reviewed following labs and imaging studies  CBC: No results for input(s): WBC, NEUTROABS, HGB, HCT, MCV, PLT in the last 168 hours. Basic Metabolic Panel: Recent Labs  Lab 10/16/17 0346 10/19/17 0401  NA 135  --   K 3.9  --   CL 90*  --   CO2 34*  --   GLUCOSE 100*  --   BUN 28*  --   CREATININE 0.90 0.93  CALCIUM 9.0  --    GFR: Estimated Creatinine Clearance: 72.9 mL/min (by C-G formula based on SCr of 0.93 mg/dL). Liver Function Tests: No results for input(s): AST, ALT, ALKPHOS, BILITOT, PROT, ALBUMIN in the last 168 hours. No  results for input(s): LIPASE, AMYLASE in the last 168 hours. No results for input(s): AMMONIA in the last 168 hours. Coagulation Profile: No results for input(s): INR, PROTIME in the last 168 hours. Cardiac Enzymes: No results for input(s): CKTOTAL, CKMB, CKMBINDEX, TROPONINI in the last 168 hours. BNP (last 3 results) No results for input(s): PROBNP in the last 8760 hours. HbA1C: No results for input(s): HGBA1C in the last 72 hours. CBG: No results for  input(s): GLUCAP in the last 168 hours. Lipid Profile: No results for input(s): CHOL, HDL, LDLCALC, TRIG, CHOLHDL, LDLDIRECT in the last 72 hours. Thyroid Function Tests: No results for input(s): TSH, T4TOTAL, FREET4, T3FREE, THYROIDAB in the last 72 hours. Anemia Panel: No results for input(s): VITAMINB12, FOLATE, FERRITIN, TIBC, IRON, RETICCTPCT in the last 72 hours. Sepsis Labs: No results for input(s): PROCALCITON, LATICACIDVEN in the last 168 hours.  No results found for this or any previous visit (from the past 240 hour(s)).       Radiology Studies: No results found.      Scheduled Meds: . aspirin EC  81 mg Oral Daily  . divalproex  250 mg Oral QHS  . enoxaparin (LOVENOX) injection  40 mg Subcutaneous Q24H  . feeding supplement (ENSURE ENLIVE)  237 mL Oral BID BM  . folic acid  1 mg Oral Daily  . furosemide  40 mg Oral Daily  . metoprolol tartrate  25 mg Oral BID  . QUEtiapine  50 mg Oral QHS  . senna  1 tablet Oral QHS  . thiamine  100 mg Oral Daily   Continuous Infusions:   LOS: 15 days    Cherylanne Ardelean Tanna Furry, MD [facility] Pager [phone]  If 7PM-7AM, please contact night-coverage [email or web address] Password [email or web address] 10/20/2017, 12:06 PM

## 2017-10-21 NOTE — Progress Notes (Signed)
Pt ambulating in the hall with PT and using a walker, tolerated well. Sitting up in chair. Appetite good. Pt has confusion at times.

## 2017-10-21 NOTE — Progress Notes (Signed)
Physical Therapy Treatment Patient Details Name: Evan Charles MRN: [id number] DOB: October 01, 1946 Today's Date: 10/21/2017    History of Present Illness 71 yo male admitted with FTT, fall at home, weakness. Hx of Sz, spinal stenosis, dementia, CVA, neuropathy, anemia    PT Comments    Assisted OOB to amb to bathroom.  Assisted with hygiene after pt had a large formed BM.  Assisted with amb a greater distance in hallway with walker.  Pt progressing but will still need ST Rehab at SNF for gait instability.  See mobility details below.   Follow Up Recommendations  SNF     Equipment Recommendations  None recommended by PT    Recommendations for Other Services       Precautions / Restrictions Precautions Precautions: Fall Restrictions Weight Bearing Restrictions: No Other Position/Activity Restrictions: WBAT    Mobility  Bed Mobility Overal bed mobility: Needs Assistance Bed Mobility: Supine to Sit     Supine to sit: Min assist     General bed mobility comments: increased time  Transfers Overall transfer level: Needs assistance Equipment used: Rolling walker (2 wheeled) Transfers: Sit to/from Stand Sit to Stand: Mod assist;Max assist         General transfer comment: initial posterior lean/LOB      once upright demonstarted fair balance.    Ambulation/Gait Ambulation/Gait assistance: Mod assist Ambulation Distance (Feet): 225 Feet Assistive device: Rolling walker (2 wheeled) Gait Pattern/deviations: Decreased stride length;Narrow base of support;Trunk flexed Gait velocity: decreased   General Gait Details: shuffled short steps with tendancy to shift right.  Slow with poor forward flex posture hips and knees.  HIGH FALL RISK with delayed corrective reaction.  Did tolerate an increased distance.     Stairs            Wheelchair Mobility    Modified Rankin (Stroke Patients Only)       Balance                                             Cognition Arousal/Alertness: Awake/alert Behavior During Therapy: WFL for tasks assessed/performed Overall Cognitive Status: History of cognitive impairments - at baseline                                 General Comments: pleasant and playful.  following all commands      Exercises      General Comments        Pertinent Vitals/Pain Pain Assessment: No/denies pain    Home Living                      Prior Function            PT Goals (current goals can now be found in the care plan section) Progress towards PT goals: Progressing toward goals    Frequency    Min 2X/week      PT Plan Current plan remains appropriate    Co-evaluation              AM-PAC PT "6 Clicks" Daily Activity  Outcome Measure  Difficulty turning over in bed (including adjusting bedclothes, sheets and blankets)?: Unable Difficulty moving from lying on back to sitting on the side of the bed? : Unable Difficulty sitting down on and standing up from  a chair with arms (e.g., wheelchair, bedside commode, etc,.)?: Unable Help needed moving to and from a bed to chair (including a wheelchair)?: A Lot Help needed walking in hospital room?: A Lot Help needed climbing 3-5 steps with a railing? : Total 6 Click Score: 8    End of Session Equipment Utilized During Treatment: Gait belt Activity Tolerance: Patient tolerated treatment well Patient left: in chair;with chair alarm set;with call bell/phone within reach Nurse Communication: Mobility status PT Visit Diagnosis: Muscle weakness (generalized) (M62.81);Difficulty in walking, not elsewhere classified (R26.2);History of falling (Z91.81)     Time: 9937-1696 PT Time Calculation (min) (ACUTE ONLY): 32 min  Charges:  $Gait Training: 8-22 mins $Therapeutic Activity: 8-22 mins                    G Codes:       Rica Koyanagi  PTA [facility]  Acute  Rehab Pager      [phone]

## 2017-10-21 NOTE — Progress Notes (Signed)
PROGRESS NOTE    Evan Charles  GYF:[id number] DOB: [birth year] DOA: 10/04/2017 PCP: [facility]   Brief Narrative: 71 y.o.malewith medical history significant ofdementia, cva, anemia, failure to thrive, who is brought via ems after being found by nephew who went to check on him. Found down at home incoherent, sitting in own feces. Multiple recent hospitalizations for similar episodes. Once found to have rhabdomyolysis.  Patient has no capacity to make decision and has been referred to APS.  Social worker following.  No new event.  Continue current medical and supportive care.  Social worker evaluation ongoing.  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  Lacks capacity to make clinical decision.  Patient likely has chronic gradual decline in his mental status.  No sign of infection.  The social worker following for safe discharge planning.  Waiting guardianship hearing. -Continue Seroquel and Ativan as needed for the management of agitation  #Hypertension: Blood pressure acceptable.  Norvasc discontinued.  Currently on metoprolol and Lasix 40 mg.  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)   Visual hallucinations   Failure to thrive in adult   Essential hypertension   Physical deconditioning   Dementia with psychosis   Stroke (cerebrum) (HCC)   Dementia  DVT prophylaxis: Lovenox subcutaneous Code Status: Full code Family Communication: No family at bedside Disposition Plan: Unknown dispo plan    Consultants:   None  Procedures: None Antimicrobials: None  Subjective: Seen and examined at bedside.  Denied pain, nausea vomiting.  Wanted to go home.  Objective: Vitals:   10/20/17 1456 10/20/17 2115 10/21/17 0631 10/21/17 1006  BP: 110/73 111/61 109/73 109/73    Pulse: 73 83 80 80  Resp: 16 16 16    Temp: (!) 97.5 F (36.4 C) 97.9 F (36.6 C) 98 F (36.7 C)   TempSrc: Oral Oral Oral   SpO2: 100% 100% 97%   Weight:      Height:        Intake/Output Summary (Last 24 hours) at 10/21/2017 1226 Last data filed at 10/21/2017 1032 Gross per 24 hour  Intake 1074 ml  Output 700 ml  Net 374 ml   Filed Weights   10/05/17 0217  Weight: 80.2 kg (176 lb 12.9 oz)    Examination:  General exam: Lying in bed comfortable, not in distress Respiratory system: Clear bilateral, respiratory for normal Cardiovascular system: Regular rate rhythm S1-S2 normal.  No pedal edema. Gastrointestinal system: Abdomen is nondistended, soft and nontender. Normal bowel sounds heard. Central nervous system: Alert awake and following commands Skin: No rashes, lesions or ulcers Psychiatry: Judgement and insight appear impaired    Data Reviewed: I have personally reviewed following labs and imaging studies  CBC: No results for input(s): WBC, NEUTROABS, HGB, HCT, MCV, PLT in the last 168 hours. Basic Metabolic Panel: Recent Labs  Lab 10/16/17 0346 10/19/17 0401  NA 135  --   K 3.9  --   CL 90*  --   CO2 34*  --   GLUCOSE 100*  --   BUN 28*  --   CREATININE 0.90 0.93  CALCIUM 9.0  --    GFR: Estimated Creatinine Clearance: 72.9 mL/min (by C-G formula based on SCr of 0.93 mg/dL). Liver Function Tests: No results for input(s): AST, ALT, ALKPHOS, BILITOT, PROT, ALBUMIN in the last  168 hours. No results for input(s): LIPASE, AMYLASE in the last 168 hours. No results for input(s): AMMONIA in the last 168 hours. Coagulation Profile: No results for input(s): INR, PROTIME in the last 168 hours. Cardiac Enzymes: No results for input(s): CKTOTAL, CKMB, CKMBINDEX, TROPONINI in the last 168 hours. BNP (last 3 results) No results for input(s): PROBNP in the last 8760 hours. HbA1C: No results for input(s): HGBA1C in the last 72 hours. CBG: No results for  input(s): GLUCAP in the last 168 hours. Lipid Profile: No results for input(s): CHOL, HDL, LDLCALC, TRIG, CHOLHDL, LDLDIRECT in the last 72 hours. Thyroid Function Tests: No results for input(s): TSH, T4TOTAL, FREET4, T3FREE, THYROIDAB in the last 72 hours. Anemia Panel: No results for input(s): VITAMINB12, FOLATE, FERRITIN, TIBC, IRON, RETICCTPCT in the last 72 hours. Sepsis Labs: No results for input(s): PROCALCITON, LATICACIDVEN in the last 168 hours.  No results found for this or any previous visit (from the past 240 hour(s)).       Radiology Studies: No results found.      Scheduled Meds: . aspirin EC  81 mg Oral Daily  . divalproex  250 mg Oral QHS  . enoxaparin (LOVENOX) injection  40 mg Subcutaneous Q24H  . feeding supplement (ENSURE ENLIVE)  237 mL Oral BID BM  . folic acid  1 mg Oral Daily  . furosemide  40 mg Oral Daily  . metoprolol tartrate  25 mg Oral BID  . QUEtiapine  50 mg Oral QHS  . senna  1 tablet Oral QHS  . thiamine  100 mg Oral Daily   Continuous Infusions:   LOS: 16 days    Kerrilyn Azbill Tanna Furry, MD [facility] Pager [phone]  If 7PM-7AM, please contact night-coverage [email or web address] Password [email or web address] 10/21/2017, 12:26 PM

## 2017-10-22 NOTE — Care Management Important Message (Signed)
Important Message  Patient Details  Name: Evan Charles MRN: [id number] Date of Birth: August 01, 1946   Medicare Important Message Given:  Yes    Kerin Salen 10/22/2017, 2:30 [place] Message  Patient Details  Name: Evan Charles MRN: [id number] Date of Birth: 01-Aug-1946   Medicare Important Message Given:  Yes    Kerin Salen 10/22/2017, 2:30 PM

## 2017-10-22 NOTE — Plan of Care (Signed)
  Safety: Ability to remain free from injury will improve 10/22/2017 0318 - Progressing by Mickie Kay, RN

## 2017-10-22 NOTE — Progress Notes (Signed)
CSW following for disposition- complex case.  Pt served with notice of competency hearing 12/05/16, and hearing for interim guardian appointment scheduled 10/24/17. Spoke with pt's DSS worker T. Turner, advised pt will have guardian ad litem to represent him at hearing this week.  CSW left voicemail for guardian ad litem at [phone]. Spoke with [facility], who is considering accepting pt for admission once there is a legal guardian in place (or interim legal guardian). Would consider accepting LOG as currently pt does not have long term payor source and plan per DSS would be to have guardian move forward with accessing pt's funds and apply for medicaid when appropriate (Pt has [facility] Medicare but is in copay days for SNF which he has refused).  Will continue following and assist.   Sharren Bridge, MSW, LCSW Clinical Social Work 10/22/2017 [phone]

## 2017-10-22 NOTE — Progress Notes (Signed)
PROGRESS NOTE    Evan Charles  JQB:[id number] DOB: February 18, 1946 DOA: 10/04/2017 PCP: [facility]   Brief Narrative: 71 y.o.malewith medical history significant ofdementia, cva, anemia, failure to thrive, who is brought via ems after being found by nephew who went to check on him. Found down at home incoherent, sitting in own feces. Multiple recent hospitalizations for similar episodes. Once found to have rhabdomyolysis.  Patient has no capacity to make decision and has been referred to APS.  Social worker following.  No change in clinical condition.  Continue current supportive care.  Waiting for a court date for guardianship hearing.  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  Lacks capacity to make clinical decision.  Patient likely has chronic gradual decline in his mental status.  No sign of infection.  The social worker following for safe discharge planning.  Waiting guardianship hearing. -Continue Seroquel and Ativan as needed for the management of agitation  #Hypertension: Blood pressure acceptable.  Norvasc discontinued.  Currently on metoprolol and Lasix 40 mg.  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  Denies headache, dizziness, nausea vomiting chest pain or shortness of breath.  Objective: Vitals:   10/21/17 0631 10/21/17 1006 10/21/17  1941 10/22/17 0459  BP: 109/73 109/73 109/71 (!) 142/90  Pulse: 80 80 80 70  Resp: 16  14 16   Temp: 98 F (36.7 C)  97.6 F (36.4 C) (!) 97.4 F (36.3 C)  TempSrc: Oral  Oral Oral  SpO2: 97%  100% 98%  Weight:      Height:        Intake/Output Summary (Last 24 hours) at 10/22/2017 1449 Last data filed at 10/22/2017 0900 Gross per 24 hour  Intake 1432 ml  Output -  Net 1432 ml   Filed Weights   10/05/17 0217  Weight: 80.2 kg (176 lb 12.9 oz)    Examination:  General exam: Lying on bed comfortable, not in distress Respiratory system: Clear bilateral, no wheezing or crackle. Cardiovascular system: Regular rate rhythm S1-S2 normal.  No pedal edema. Gastrointestinal system: Abdomen is nondistended, soft and nontender. Normal bowel sounds heard. Central nervous system: Alert awake and following commands Skin: No rashes, lesions or ulcers Psychiatry: Judgement and insight appear impaired    Data Reviewed: I have personally reviewed following labs and imaging studies  CBC: No results for input(s): WBC, NEUTROABS, HGB, HCT, MCV, PLT in the last 168 hours. Basic Metabolic Panel: Recent Labs  Lab 10/16/17 0346 10/19/17 0401  NA 135  --   K 3.9  --   CL 90*  --   CO2 34*  --   GLUCOSE 100*  --   BUN 28*  --   CREATININE 0.90 0.93  CALCIUM 9.0  --    GFR: Estimated Creatinine Clearance: 72.9 mL/min (by C-G formula based on SCr of 0.93 mg/dL). Liver Function Tests: No results for input(s): AST, ALT, ALKPHOS,  BILITOT, PROT, ALBUMIN in the last 168 hours. No results for input(s): LIPASE, AMYLASE in the last 168 hours. No results for input(s): AMMONIA in the last 168 hours. Coagulation Profile: No results for input(s): INR, PROTIME in the last 168 hours. Cardiac Enzymes: No results for input(s): CKTOTAL, CKMB, CKMBINDEX, TROPONINI in the last 168 hours. BNP (last 3 results) No results for input(s): PROBNP in the last 8760 hours. HbA1C: No results for input(s):  HGBA1C in the last 72 hours. CBG: No results for input(s): GLUCAP in the last 168 hours. Lipid Profile: No results for input(s): CHOL, HDL, LDLCALC, TRIG, CHOLHDL, LDLDIRECT in the last 72 hours. Thyroid Function Tests: No results for input(s): TSH, T4TOTAL, FREET4, T3FREE, THYROIDAB in the last 72 hours. Anemia Panel: No results for input(s): VITAMINB12, FOLATE, FERRITIN, TIBC, IRON, RETICCTPCT in the last 72 hours. Sepsis Labs: No results for input(s): PROCALCITON, LATICACIDVEN in the last 168 hours.  No results found for this or any previous visit (from the past 240 hour(s)).       Radiology Studies: No results found.      Scheduled Meds: . aspirin EC  81 mg Oral Daily  . divalproex  250 mg Oral QHS  . enoxaparin (LOVENOX) injection  40 mg Subcutaneous Q24H  . feeding supplement (ENSURE ENLIVE)  237 mL Oral BID BM  . folic acid  1 mg Oral Daily  . furosemide  40 mg Oral Daily  . metoprolol tartrate  25 mg Oral BID  . QUEtiapine  50 mg Oral QHS  . senna  1 tablet Oral QHS  . thiamine  100 mg Oral Daily   Continuous Infusions:   LOS: 17 days    Dron Tanna Furry, MD [facility] Pager [phone]  If 7PM-7AM, please contact night-coverage [email or web address] Password [email or web address] 10/22/2017, 2:49 PM

## 2017-10-23 DIAGNOSIS — I10 Essential (primary) hypertension: Secondary | ICD-10-CM

## 2017-10-23 NOTE — Progress Notes (Signed)
[facility] PROGRESS NOTE  Evan Charles QJF:[id number] DOB: 12-03-1945 DOA: 10/04/2017  PCP: [facility]  Brief History/Interval Summary: 71 year old male with a past medical history of dementia, stroke, anemia was brought into the hospital by his nephew who found him incoherent at home sitting in his own feces.  Patient has had multiple hospitalizations for similar episodes.  Apparently he leaves from skilled nursing facility [place].  He has been found to have no capacity and has been referred to Adult Protective Services in the past.   Consultants: None  Procedures: None  Antibiotics: None  Subjective/Interval History: Patient remains distracted.   ROS: No nausea or vomiting  Objective:  Vital Signs  Vitals:   10/22/17 0459 10/22/17 1409 10/22/17 2018 10/23/17 0457  BP: (!) 142/90 99/72 109/76 134/87  Pulse: 70 78 76 68  Resp: 16 16  18   Temp: (!) 97.4 F (36.3 C) 98 F (36.7 C) 98.3 F (36.8 C) 98 F (36.7 C)  TempSrc: Oral Oral Oral Oral  SpO2: 98% 99% 98% 100%  Weight:      Height:        Intake/Output Summary (Last 24 hours) at 10/23/2017 1534 Last data filed at 10/23/2017 1127 Gross per 24 hour  Intake 480 ml  Output 1550 ml  Net -1070 ml   Filed Weights   10/05/17 0217  Weight: 80.2 kg (176 lb 12.9 oz)    General appearance: alert,  appears stated age and no distress Resp: clear to auscultation bilaterally Cardio: regular rate and rhythm, S1, S2 normal, no murmur, click, rub or gallop GI: soft, non-tender; bowel sounds normal; no masses,  no organomegaly Extremities: extremities normal, atraumatic, no cyanosis or edema Neurologic: No focal deficits.  Remains confused and distracted.  Lab Results:  Data Reviewed: I have personally reviewed following labs and imaging studies  Basic Metabolic Panel: Recent Labs  Lab 10/19/17 0401  CREATININE 0.93    GFR: Estimated Creatinine Clearance: 72.9 mL/min  (by C-G formula based on SCr of 0.93 mg/dL).   Radiology Studies: No results found.   Medications:  Scheduled: . aspirin EC  81 mg Oral Daily  . divalproex  250 mg Oral QHS  . enoxaparin (LOVENOX) injection  40 mg Subcutaneous Q24H  . feeding supplement (ENSURE ENLIVE)  237 mL Oral BID BM  . folic acid  1 mg Oral Daily  . furosemide  40 mg Oral Daily  . metoprolol tartrate  25 mg Oral BID  . QUEtiapine  50 mg Oral QHS  . senna  1 tablet Oral QHS  . 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 Lacks capacity to make clinical decision. Patient likely has chronic gradual decline in his mental status. No sign of infection. Social worker is following for safe discharge planning.  Guardianship is being pursued. Continue Seroquel and Ativan as needed for the management of agitation.  Essential hypertension Blood pressure acceptable.  Norvasc was discontinued.  Currently on metoprolol and Lasix 40 mg.  Check electrolytes.  Failure to thrive Prior history of  stroke: Continue aspirin  DVT Prophylaxis: Lovenox    Code Status: Full code Family Communication: Family at bedside Disposition Plan: Await placement    LOS: 54 days   [facility] Pager [phone] 10/23/2017, 3:34 PM  If 7PM-7AM, please contact night-coverage at [email or web address], password [facility]

## 2017-10-23 NOTE — Progress Notes (Signed)
Physical Therapy Treatment Patient Details Name: Evan Charles MRN: [id number] DOB: [birth year] Today's Date: 10/23/2017    History of Present Illness 71 yo male admitted with FTT, fall at home, weakness. Hx of Sz, spinal stenosis, dementia, CVA, neuropathy, anemia    PT Comments    Assisted OOB to amb in hallway(see mobility details below).  Pt easily agitated this session wanting to know why we are keeping him here and why he is not allowed to get up and leave the room without "that voice' yelling at him.  Pt currently has a Corporate investment banker.     Follow Up Recommendations  SNF     Equipment Recommendations  None recommended by PT    Recommendations for Other Services       Precautions / Restrictions Precautions Precautions: Fall Precaution Comments: can be agitated Restrictions Weight Bearing Restrictions: No Other Position/Activity Restrictions: WBAT    Mobility  Bed Mobility Overal bed mobility: Needs Assistance       Supine to sit: Supervision;Min guard     General bed mobility comments: increased time  Transfers Overall transfer level: Needs assistance Equipment used: Rolling walker (2 wheeled) Transfers: Sit to/from Stand Sit to Stand: Min guard;Min assist Stand pivot transfers: Min guard;Min assist       General transfer comment: initial posterior lean/LOB      once upright demonstarted fair balance.    Ambulation/Gait Ambulation/Gait assistance: Min assist Ambulation Distance (Feet): 135 Feet Assistive device: Rolling walker (2 wheeled)   Gait velocity: decreased   General Gait Details: VERY unsteady gait with shuffled steps and poor forward flex posture plus delayed coorective reaction.  HIGH FALL RISK.    Stairs            Wheelchair Mobility    Modified Rankin (Stroke Patients Only)       Balance                                            Cognition Arousal/Alertness: Awake/alert Behavior During Therapy: WFL  for tasks assessed/performed Overall Cognitive Status: History of cognitive impairments - at baseline                                 General Comments: pt wanted to know why people were keeping him here      Exercises      General Comments        Pertinent Vitals/Pain Pain Assessment: No/denies pain    Home Living                      Prior Function            PT Goals (current goals can now be found in the care plan section) Progress towards PT goals: Progressing toward goals    Frequency    Min 2X/week      PT Plan Current plan remains appropriate    Co-evaluation              AM-PAC PT "6 Clicks" Daily Activity  Outcome Measure  Difficulty turning over in bed (including adjusting bedclothes, sheets and blankets)?: Unable Difficulty moving from lying on back to sitting on the side of the bed? : Unable Difficulty sitting down on and standing up from a chair with arms (e.g., wheelchair, bedside commode,  etc,.)?: Unable Help needed moving to and from a bed to chair (including a wheelchair)?: A Lot Help needed walking in hospital room?: A Lot Help needed climbing 3-5 steps with a railing? : Total 6 Click Score: 8    End of Session Equipment Utilized During Treatment: Gait belt Activity Tolerance: Patient tolerated treatment well Patient left: in chair;with chair alarm set;with call bell/phone within reach Nurse Communication: Mobility status PT Visit Diagnosis: Muscle weakness (generalized) (M62.81);Difficulty in walking, not elsewhere classified (R26.2);History of falling (Z91.81)     Time: 7846-9629 PT Time Calculation (min) (ACUTE ONLY): 24 min  Charges:  $Gait Training: 8-22 mins $Therapeutic Activity: 8-22 mins                    G Codes:       Rica Koyanagi  PTA [facility]  Acute  Rehab Pager      [phone]

## 2017-10-23 NOTE — Plan of Care (Signed)
  Elimination: Will not experience complications related to bowel motility 10/23/2017 0211 - Progressing by Mickie Kay, RN

## 2017-10-24 LAB — BASIC METABOLIC PANEL
ANION GAP: 6 (ref 5–15)
BUN: 36 mg/dL — ABNORMAL HIGH (ref 6–20)
CHLORIDE: 103 mmol/L (ref 101–111)
CO2: 31 mmol/L (ref 22–32)
Calcium: 9.3 mg/dL (ref 8.9–10.3)
Creatinine, Ser: 1.02 mg/dL (ref 0.61–1.24)
GFR calc Af Amer: >60 mL/min (ref >60)
GFR calc non Af Amer: >60 mL/min (ref >60)
GLUCOSE: 104 mg/dL — AB (ref 65–99)
POTASSIUM: 4 mmol/L (ref 3.5–5.1)
Sodium: 140 mmol/L (ref 135–145)

## 2017-10-24 MED ORDER — FUROSEMIDE 20 MG PO TABS
20.0000 mg | ORAL_TABLET | Freq: Every day | ORAL | Status: DC
  Administered 2017-10-25: 20 mg via ORAL
  Filled 2017-10-24: qty 1

## 2017-10-24 NOTE — Progress Notes (Signed)
[facility] PROGRESS NOTE  COPPER BASNETT BOF:[id number] DOB: October 12, 1946 DOA: 10/04/2017  [facility]P: [facility]  Brief History/Interval Summary: 71 year old male with a past medical history of dementia, stroke, anemia was brought into the hospital by his nephew who found him incoherent at home sitting in his own feces.  Patient has had multiple hospitalizations for similar episodes.  Apparently he leaves from skilled nursing facility [place].  He has been found to have no capacity and has been referred to Adult Protective Services in the past.   Consultants: None  Procedures: None  Antibiotics: None  Subjective/Interval History: Patient complains of his usual aches and pains including chronic back pain.  No other complaints offered.  ROS: Denies any nausea or vomiting.  Has good appetite.  Objective:  Vital Signs  Vitals:   10/23/17 0457 10/23/17 1430 10/23/17 2006 10/24/17 0404  BP: 134/87 132/82 100/72 102/62  Pulse: 68 70 84 80  Resp: 18 18 16 18   Temp: 98 F (36.7 C) 98.6 F (37 C) 98.2 F (36.8 C) 98.7 F (37.1 C)  TempSrc: Oral Oral Oral Oral  SpO2: 100% 100% 100% 100%  Weight:      Height:        Intake/Output Summary (Last 24 hours) at 10/24/2017 1036 Last data filed at 10/24/2017 0405 Gross per 24 hour  Intake 600 ml  Output 2050 ml  Net -1450 ml   Filed Weights   10/05/17 0217  Weight: 80.2 kg (176 lb 12.9 oz)    General appearance: Awake alert.  In no distress Resp: Clear to auscultation bilaterally.  No wheezing rales or rhonchi Cardio: S1-S2 is normal regular.  No S3-S4.  No rubs murmurs or bruit GI: Abdomen is soft.  Nontender nondistended Extremities: No edema Neurologic: No focal deficits.  Remains confused and distracted.  Lab Results:  Data Reviewed: I have personally reviewed following labs and imaging studies  Basic Metabolic Panel: Recent Labs  Lab 10/19/17 0401 10/24/17 0351  NA  --  140  K   --  4.0  CL  --  103  CO2  --  31  GLUCOSE  --  104*  BUN  --  36*  CREATININE 0.93 1.02  CALCIUM  --  9.3    GFR: Estimated Creatinine Clearance: 66.4 mL/min (by C-G formula based on SCr of 1.02 mg/dL).   Radiology Studies: No results found.   Medications:  Scheduled: . aspirin EC  81 mg Oral Daily  . divalproex  250 mg Oral QHS  . enoxaparin (LOVENOX) injection  40 mg Subcutaneous Q24H  . feeding supplement (ENSURE ENLIVE)  237 mL Oral BID BM  . folic acid  1 mg Oral Daily  . furosemide  40 mg Oral Daily  . metoprolol tartrate  25 mg Oral BID  . QUEtiapine  50 mg Oral QHS  . senna  1 tablet Oral QHS  . 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 Lacks capacity to make clinical decision. Patient likely has had a gradual decline in his mental  status. No sign of infection. Social worker is following for safe discharge planning.  Guardianship is being pursued. Continue Seroquel and Ativan as needed for the management of agitation.  Mental status and behavior has been stable to improved over the last 48 hours.  Essential hypertension Blood pressure reasonably well controlled.  Last few readings have been borderline low normal.  Currently on metoprolol and Lasix.  May have to cut back on the dose of Lasix.  Electrolytes normal.    Failure to thrive Prior history of stroke: Continue aspirin  DVT Prophylaxis: Lovenox    Code Status: Full code Family Communication: Family at bedside Disposition Plan:  Await placement.  Being followed by physical therapy.    LOS: 19 days   [facility] Pager [phone] 10/24/2017, 10:36 AM  If 7PM-7AM, please contact night-coverage at [email or web address], password [facility]

## 2017-10-24 NOTE — Progress Notes (Signed)
CSW following for disposition- complex case  Pt had interim guardian appointment hearing scheduled for today. However, CSW received call from DSS case worker this morning advising that hearing may be postponed as "guardian ad litem did not know court was scheduled for today." States she is working to rectify this and will call CSW back.  [facility] has advised that while they can accept pt, they cannot admit him until there is an interim guardian to sign him in.  CSW staffed case with leadership- seeking SNF which would agree to have [place] leadership sign pt into a facility with anticipation of DSS guardianship being imminent.  Sharren Bridge, MSW, LCSW Clinical Social Work 10/24/2017 [phone]

## 2017-10-25 DIAGNOSIS — F0391 Unspecified dementia with behavioral disturbance: Secondary | ICD-10-CM | POA: Diagnosis not present

## 2017-10-25 DIAGNOSIS — R627 Adult failure to thrive: Secondary | ICD-10-CM | POA: Diagnosis not present

## 2017-10-25 DIAGNOSIS — M2681 Anterior soft tissue impingement: Secondary | ICD-10-CM | POA: Diagnosis not present

## 2017-10-25 DIAGNOSIS — I10 Essential (primary) hypertension: Secondary | ICD-10-CM | POA: Diagnosis not present

## 2017-10-25 DIAGNOSIS — R296 Repeated falls: Secondary | ICD-10-CM | POA: Diagnosis not present

## 2017-10-25 DIAGNOSIS — R6889 Other general symptoms and signs: Secondary | ICD-10-CM | POA: Diagnosis not present

## 2017-10-25 DIAGNOSIS — J449 Chronic obstructive pulmonary disease, unspecified: Secondary | ICD-10-CM | POA: Diagnosis not present

## 2017-10-25 DIAGNOSIS — Z79899 Other long term (current) drug therapy: Secondary | ICD-10-CM | POA: Diagnosis not present

## 2017-10-25 DIAGNOSIS — R262 Difficulty in walking, not elsewhere classified: Secondary | ICD-10-CM | POA: Diagnosis not present

## 2017-10-25 DIAGNOSIS — R05 Cough: Secondary | ICD-10-CM | POA: Diagnosis not present

## 2017-10-25 DIAGNOSIS — R600 Localized edema: Secondary | ICD-10-CM | POA: Diagnosis not present

## 2017-10-25 DIAGNOSIS — M6281 Muscle weakness (generalized): Secondary | ICD-10-CM | POA: Diagnosis not present

## 2017-10-25 DIAGNOSIS — R278 Other lack of coordination: Secondary | ICD-10-CM | POA: Diagnosis not present

## 2017-10-25 DIAGNOSIS — M48061 Spinal stenosis, lumbar region without neurogenic claudication: Secondary | ICD-10-CM | POA: Diagnosis not present

## 2017-10-25 MED ORDER — QUETIAPINE FUMARATE 50 MG PO TABS: 50.0000 mg | ORAL_TABLET | Freq: Every day | ORAL | 0 refills | Status: AC

## 2017-10-25 MED ORDER — THIAMINE HCL 100 MG PO TABS: 100.0000 mg | ORAL_TABLET | Freq: Every day | ORAL | Status: AC

## 2017-10-25 MED ORDER — TRAMADOL HCL 50 MG PO TABS: 50.0000 mg | ORAL_TABLET | Freq: Four times a day (QID) | ORAL | 0 refills | Status: AC | PRN

## 2017-10-25 MED ORDER — FUROSEMIDE 20 MG PO TABS: 20.0000 mg | ORAL_TABLET | Freq: Every day | ORAL | Status: AC

## 2017-10-25 MED ORDER — ENSURE ENLIVE PO LIQD: 237.0000 mL | Freq: Two times a day (BID) | ORAL | 12 refills | Status: AC

## 2017-10-25 MED ORDER — ACETAMINOPHEN 325 MG PO TABS: 650.0000 mg | ORAL_TABLET | Freq: Four times a day (QID) | ORAL | Status: AC | PRN

## 2017-10-25 MED ORDER — IPRATROPIUM-ALBUTEROL 0.5-2.5 (3) MG/3ML IN SOLN: 3.0000 mL | RESPIRATORY_TRACT | Status: AC | PRN

## 2017-10-25 MED ORDER — ASPIRIN 81 MG PO TBEC: 81.0000 mg | DELAYED_RELEASE_TABLET | Freq: Every day | ORAL | Status: AC

## 2017-10-25 MED ORDER — FOLIC ACID 1 MG PO TABS: 1.0000 mg | ORAL_TABLET | Freq: Every day | ORAL | Status: AC

## 2017-10-25 NOTE — Progress Notes (Signed)
Pt has been accepted to [facility] (SNF). Plan for DC today. Report [phone]- Room [phone]  Pt's interim guardian case was accepted and guardian is T. Earlene Plater [phone] [place] DSS. Guardian agreeable to SNF admission.  Competency court hearing 12/05/17.  DSS does not yet have guardianship paperwork- explained that qualification packet being sent to court today. As no paperwork yet available to show guardianship, SNF agreed to having CSW leadership sign pt in to SNF.  CSW informed pt's [name] of disposition. He is agreeable as well. States he will assist in informing other family members of plan.  Discussed transportation needs with medical team- advised that pt should be well-managed in wheelchair van. (CSW leadership will assist in payment for cost of transport)   Will arrange transportation once DC information complete and CSW receives confirmation that facility has received it and prepared for admission.  Sharren Bridge, MSW, LCSW Clinical Social Work 10/25/2017 [phone]

## 2017-10-25 NOTE — Discharge Summary (Signed)
[facility]  Physician Discharge Summary   Patient ID: Evan Charles MRN: [id number] DOB/AGE: 1945/12/02 71 y.o.  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 Check CBC and basic metabolic panel in 1 week   DISCHARGE CONDITION: fair  Diet recommendation: Heart healthy  Filed Weights   10/05/17 0217  Weight: 80.2 kg (176 lb 12.9 oz)    INITIAL HISTORY: 71 year old male with a past medical history of dementia, stroke, anemia was brought into the hospital by his nephew who found him incoherent at home sitting in his own feces.  Patient has had multiple hospitalizations for similar episodes.  Apparently he leaves from skilled nursing facility [facility].  He has been found to have no capacity and has been referred to Adult Protective Services in the past.   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 Lacks capacity to make clinical decision. Patient likely has had a gradual decline in his mental status.No sign of infection. Social worker was consulted.  Guardianship is being pursued.  Patient's behavior has been reasonably well controlled on Seroquel.  Has not required lorazepam in the past many days.  He has been accepted at a skilled nursing facility.  Medically stable for discharge.   Essential hypertension Blood pressure reasonably well controlled.  Last few readings have been borderline low normal.  Currently on metoprolol and Lasix.  Dose of Lasix was reduced.  Electrolytes were checked and  were normal.  Would recommend continuing to check electrolytes periodically.  Failure to thrive/Prior history of stroke Continue aspirin  Overall stable.  Okay for discharge today to skilled nursing facility.    PERTINENT LABS:  The results of significant diagnostics from this hospitalization (including imaging, microbiology, ancillary and laboratory) are listed below for reference.      Labs: Basic Metabolic Panel: Recent Labs  Lab 10/19/17 0401 10/24/17 0351  NA  --  140  K  --  4.0  CL  --  103  CO2  --  31  GLUCOSE  --  104*  BUN  --  36*  CREATININE 0.93 1.02  CALCIUM  --  9.3     IMAGING STUDIES Ct Head Wo Contrast  Result Date: 10/04/2017 CLINICAL DATA:  Altered level of consciousness. History of dementia. EXAM: CT HEAD WITHOUT CONTRAST TECHNIQUE: Contiguous axial images were obtained from the base of the skull through the vertex without intravenous contrast. COMPARISON:  09/06/2017 FINDINGS: Brain: There is no evidence of acute infarct, intracranial hemorrhage, mass, midline shift, or extra-axial fluid collection. Mild generalized cerebral atrophy is again noted. Periventricular white matter hypodensities are unchanged and nonspecific but compatible with mild chronic small vessel ischemic disease. Vascular: Mild calcified atherosclerosis at the skullbase. No hyperdense vessel. Skull: No fracture or focal osseous lesion. Sinuses/Orbits: The visualized paranasal sinuses and mastoid air cells are clear. Unremarkable orbits. Other: None. IMPRESSION: 1. No evidence of acute intracranial abnormality. 2. Mild chronic small vessel ischemic disease. Electronically Signed   By: Logan Bores M.D.   On: 10/04/2017 18:32   Dg Chest [facility]  Result Date: 10/05/2017 CLINICAL DATA:  Dyspnea. EXAM: PORTABLE CHEST 1 VIEW COMPARISON:  Radiographs of September 06, 2017. FINDINGS: The heart size and mediastinal contours are within normal limits. No pneumothorax is noted. Right lung is  clear. Mild left basilar atelectasis or inflammation is noted. No significant pleural effusion is noted. The visualized skeletal structures are unremarkable. IMPRESSION: Mild left basilar subsegmental atelectasis or inflammation. Electronically Signed   By: Marijo Conception, M.D.   On: 10/05/2017 10:08    DISCHARGE EXAMINATION: Vitals:   10/24/17 0404 10/24/17 1628 10/24/17 2009 10/25/17 0416  BP: 102/62 121/76 111/70 118/71  Pulse: 80 76 79 69  Resp: 18 18 16 14   Temp: 98.7 F (37.1 C) 98 F (36.7 C) 98.2 F (36.8 C) 98.4 F (36.9 C)  TempSrc: Oral Oral Oral Axillary  SpO2: 100% 100% 100% 100%  Weight:      Height:       General appearance: alert, distracted and no distress Resp: clear to auscultation bilaterally Cardio: regular rate and rhythm, S1, S2 normal, no murmur, click, rub or gallop GI: soft, non-tender; bowel sounds normal; no masses,  no organomegaly  DISPOSITION: SNF  Discharge Instructions    Call MD for:  extreme fatigue   Complete by:  As directed    Call MD for:  persistant dizziness or light-headedness   Complete by:  As directed    Call MD for:  persistant nausea and vomiting   Complete by:  As directed    Call MD for:  severe uncontrolled pain   Complete by:  As directed    Call MD for:  temperature >100.4   Complete by:  As directed    Discharge instructions   Complete by:  As directed    Please review instructions on the discharge summary.  You were cared for by a hospitalist during your hospital stay. If you have any questions about your discharge medications or the care you received while you were in the hospital after you are discharged, you can call the unit and asked to speak with the hospitalist on call if the hospitalist that took care of you is not available. Once you are discharged, your primary care physician will handle any further medical issues. Please note that NO REFILLS for any discharge medications will be authorized once you are  discharged, as it is imperative that you return to your primary care physician (or establish a relationship with a primary care physician if you do not have one) for your aftercare needs so that they can reassess your need for medications and monitor your lab values. If you do not have a primary care physician, you can call [phone] for a physician referral.   Increase activity slowly   Complete by:  As directed          Allergies as of 10/25/2017      Reactions   Tomato Hives, Itching      Medication List    STOP taking these medications   amLODipine 10 MG tablet Commonly known as:  NORVASC   aspirin 325 MG tablet Replaced by:  aspirin 81 MG EC tablet   hydrochlorothiazide 12.5 MG capsule Commonly known as:  MICROZIDE     TAKE these medications   acetaminophen 325 MG tablet Commonly known as:  TYLENOL Take 2 tablets (650 mg total) by mouth every 6 (six) hours as needed for mild pain.   aspirin 81 MG EC tablet Take 1 tablet (81 mg total) by mouth  daily. Replaces:  aspirin 325 MG tablet   Cholecalciferol 1000 units capsule Take 1,000 Units by mouth daily.   divalproex 250 MG DR tablet Commonly known as:  DEPAKOTE Take 1 tablet (250 mg total) by mouth at bedtime.   docusate sodium 100 MG capsule Commonly known as:  COLACE Take 1 capsule (100 mg total) by mouth 2 (two) times daily as needed for moderate constipation.   feeding supplement (ENSURE ENLIVE) Liqd Take 237 mLs by mouth 2 (two) times daily between meals.   folic acid 1 MG tablet Commonly known as:  FOLVITE Take 1 tablet (1 mg total) by mouth daily.   furosemide 20 MG tablet Commonly known as:  LASIX Take 1 tablet (20 mg total) by mouth daily. What changed:    medication strength  how much to take   ipratropium-albuterol 0.5-2.5 (3) MG/3ML Soln Commonly known as:  DUONEB Take 3 mLs by nebulization every 4 (four) hours as needed.   KLOR-CON 10 10 MEQ tablet Generic drug:  potassium  chloride Take 10 mEq by mouth daily.   metoprolol tartrate 25 MG tablet Commonly known as:  LOPRESSOR Take 1 tablet (25 mg total) by mouth 2 (two) times daily.   QUEtiapine 50 MG tablet Commonly known as:  SEROQUEL Take 1 tablet (50 mg total) by mouth at bedtime.   senna 8.6 MG Tabs tablet Commonly known as:  SENOKOT Take 1 tablet (8.6 mg total) by mouth at bedtime.   thiamine 100 MG tablet Take 1 tablet (100 mg total) by mouth daily.   traMADol 50 MG tablet Commonly known as:  ULTRAM Take 1 tablet (50 mg total) by mouth every 6 (six) hours as needed (for pain).        Follow-up Information    [facility], [place] [facility]. Schedule an appointment as soon as possible for a visit in 4 week(s).   Contact information: [place] [address] [phone]           TOTAL DISCHARGE TIME: 72 minutes  Bonnielee Haff  [facility] Pager [phone]  10/25/2017, 12:54 PM

## 2017-10-25 NOTE — Clinical Social Work Placement (Signed)
Patient received bed offer at [facility]. Patient to be transported via [employer]. Facility aware of patient's discharge and confirmed all paperwork received and patient's ability to arrive. CSW arranged transportation with [employer], [place] notified patient's family. Patient's RN provided number to call report and packet. CSW signing off, no other needs identified at this time.  CLINICAL SOCIAL WORK PLACEMENT  NOTE  Date:  10/25/2017  Patient Details  Name: Evan Charles MRN: [id number] Date of Birth: [birth year]  Clinical Social Work is seeking post-discharge placement for this patient at the [place] level of care (*CSW will initial, date and re-position this form in  chart as items are completed):  Yes   Patient/family provided with [facility] Work Department's list of facilities offering this level of care within the geographic area requested by the patient (or if unable, by the patient's family).  Yes   Patient/family informed of their freedom to choose among providers that offer the needed level of care, that participate in Medicare, Medicaid or managed care program needed by the patient, have an available bed and are willing to accept the patient.  Yes   Patient/family informed of [facility]'s ownership interest in [facility] and [facility], as well as of the fact that they are under no obligation to receive care at these facilities.  PASRR submitted to EDS on       PASRR number received on       Existing PASRR number confirmed on 10/10/17     FL2 transmitted to all facilities in geographic area requested by pt/family on 10/10/17     FL2 transmitted to all facilities within larger geographic area on 11/09/17     Patient informed that his/her managed care company has contracts with or will negotiate with certain facilities, including the following:        Yes   Patient/family informed of bed  offers received.  Patient chooses bed at [facility]     Physician recommends and patient chooses bed at      Patient to be transferred to [facility] on 10/25/17.  Patient to be transferred to facility by [facility]     Patient family notified on 10/25/17 of transfer.  Name of family member notified:  Leonia Corona     PHYSICIAN       Additional Comment:    _______________________________________________ Burnis Medin, LCSW 10/25/2017, 4:19 PM

## 2017-10-25 NOTE — Progress Notes (Signed)
Physical Therapy Treatment Patient Details Name: Evan Charles MRN: [id number] DOB: [birth year] Today's Date: 10/25/2017    History of Present Illness 71 yo male admitted with FTT, fall at home, weakness. Hx of Sz, spinal stenosis, dementia, CVA, neuropathy, anemia    PT Comments    Assisted out of recliner to amb a greater distance in hallway.  Pt remains unsteady and requires the use of a walker and instruction for safety esp navigating around furniture in room.    Follow Up Recommendations  SNF     Equipment Recommendations  None recommended by PT    Recommendations for Other Services       Precautions / Restrictions Precautions Precautions: Fall Precaution Comments: can be agitated Restrictions Weight Bearing Restrictions: No Other Position/Activity Restrictions: WBAT    Mobility  Bed Mobility               General bed mobility comments: OOB in recliner   Transfers Overall transfer level: Needs assistance Equipment used: Rolling walker (2 wheeled) Transfers: Sit to/from Stand Sit to Stand: Supervision;Min guard Stand pivot transfers: Supervision;Min guard       General transfer comment: initial posterior lean/LOB      once upright demonstarted fair balance.    Ambulation/Gait Ambulation/Gait assistance: Min assist Ambulation Distance (Feet): 155 Feet Assistive device: Rolling walker (2 wheeled)   Gait velocity: decreased   General Gait Details: VERY unsteady gait with shuffled steps and poor forward flex posture plus delayed coorective reaction.  HIGH FALL RISK.    Stairs            Wheelchair Mobility    Modified Rankin (Stroke Patients Only)       Balance                                            Cognition Arousal/Alertness: Awake/alert Behavior During Therapy: WFL for tasks assessed/performed Overall Cognitive Status: History of cognitive impairments - at baseline                                  General Comments: pt upset earlier when his lunch tray arrive and he did not order anything.  Then he wanted to know who was ordering "all this foo" and "whos paying for it cause I'm not".        Exercises      General Comments        Pertinent Vitals/Pain Pain Assessment: No/denies pain Pain Location: "I'm stiff all over" Pain Descriptors / Indicators: Aching Pain Intervention(s): Monitored during session    Home Living                      Prior Function            PT Goals (current goals can now be found in the care plan section) Progress towards PT goals: Progressing toward goals    Frequency    Min 2X/week      PT Plan Current plan remains appropriate    Co-evaluation              AM-PAC PT "6 Clicks" Daily Activity  Outcome Measure  Difficulty turning over in bed (including adjusting bedclothes, sheets and blankets)?: Unable   Difficulty sitting down on and standing up from a chair with arms (  e.g., wheelchair, bedside commode, etc,.)?: Unable Help needed moving to and from a bed to chair (including a wheelchair)?: A Lot Help needed walking in hospital room?: A Lot Help needed climbing 3-5 steps with a railing? : Total 6 Click Score: 7    End of Session Equipment Utilized During Treatment: Gait belt Activity Tolerance: Patient tolerated treatment well Patient left: in chair;with chair alarm set;with call bell/phone within reach Nurse Communication: Mobility status PT Visit Diagnosis: Muscle weakness (generalized) (M62.81);Difficulty in walking, not elsewhere classified (R26.2);History of falling (Z91.81)     Time: 3785-8850 PT Time Calculation (min) (ACUTE ONLY): 19 min  Charges:  $Gait Training: 8-22 mins                    G Codes:       Rica Koyanagi  PTA [facility]  Acute  Rehab Pager      [phone]

## 2017-10-29 DIAGNOSIS — I10 Essential (primary) hypertension: Secondary | ICD-10-CM | POA: Diagnosis not present

## 2017-10-29 DIAGNOSIS — R627 Adult failure to thrive: Secondary | ICD-10-CM | POA: Diagnosis not present

## 2017-10-31 DIAGNOSIS — J449 Chronic obstructive pulmonary disease, unspecified: Secondary | ICD-10-CM | POA: Diagnosis not present

## 2017-10-31 DIAGNOSIS — I10 Essential (primary) hypertension: Secondary | ICD-10-CM | POA: Diagnosis not present

## 2017-11-26 DIAGNOSIS — R600 Localized edema: Secondary | ICD-10-CM | POA: Diagnosis not present

## 2017-11-26 DIAGNOSIS — R05 Cough: Secondary | ICD-10-CM | POA: Diagnosis not present

## 2017-12-05 DIAGNOSIS — I10 Essential (primary) hypertension: Secondary | ICD-10-CM | POA: Diagnosis not present

## 2017-12-05 DIAGNOSIS — E559 Vitamin D deficiency, unspecified: Secondary | ICD-10-CM | POA: Diagnosis not present

## 2017-12-11 DIAGNOSIS — Z79899 Other long term (current) drug therapy: Secondary | ICD-10-CM | POA: Diagnosis not present

## 2017-12-13 DIAGNOSIS — R197 Diarrhea, unspecified: Secondary | ICD-10-CM | POA: Diagnosis not present

## 2017-12-16 DIAGNOSIS — K625 Hemorrhage of anus and rectum: Secondary | ICD-10-CM | POA: Diagnosis not present

## 2017-12-26 DIAGNOSIS — Z79899 Other long term (current) drug therapy: Secondary | ICD-10-CM | POA: Diagnosis not present

## 2018-01-02 DIAGNOSIS — Z79899 Other long term (current) drug therapy: Secondary | ICD-10-CM | POA: Diagnosis not present

## 2018-01-07 DIAGNOSIS — I10 Essential (primary) hypertension: Secondary | ICD-10-CM | POA: Diagnosis not present

## 2018-01-07 DIAGNOSIS — E559 Vitamin D deficiency, unspecified: Secondary | ICD-10-CM | POA: Diagnosis not present

## 2018-01-09 DIAGNOSIS — R197 Diarrhea, unspecified: Secondary | ICD-10-CM | POA: Diagnosis not present

## 2018-01-09 DIAGNOSIS — R194 Change in bowel habit: Secondary | ICD-10-CM | POA: Diagnosis not present

## 2018-01-09 DIAGNOSIS — Z79899 Other long term (current) drug therapy: Secondary | ICD-10-CM | POA: Diagnosis not present

## 2018-01-09 DIAGNOSIS — K625 Hemorrhage of anus and rectum: Secondary | ICD-10-CM | POA: Diagnosis not present

## 2018-01-09 DIAGNOSIS — R140 Abdominal distension (gaseous): Secondary | ICD-10-CM | POA: Diagnosis not present

## 2018-01-16 DIAGNOSIS — Z79899 Other long term (current) drug therapy: Secondary | ICD-10-CM | POA: Diagnosis not present

## 2018-01-20 DIAGNOSIS — K625 Hemorrhage of anus and rectum: Secondary | ICD-10-CM | POA: Diagnosis not present

## 2018-01-23 DIAGNOSIS — Z79899 Other long term (current) drug therapy: Secondary | ICD-10-CM | POA: Diagnosis not present

## 2018-02-04 IMAGING — CT CT HEAD W/O CM
3 of 6 series · 14 of 47 positions shown, 16 images · non-contrast
Comparison: Head CT 12/11/2015 and 12/13/2014

CLINICAL DATA: Visual hallucinations and falling x2 with dizziness.
Hit head and left shoulder on floor. History of prostate cancer.

EXAM:
CT HEAD WITHOUT CONTRAST
CT CERVICAL SPINE WITHOUT CONTRAST
TECHNIQUE: Multidetector CT imaging of the head and cervical spine was
performed following the standard protocol without intravenous
contrast. Multiplanar CT image reconstructions of the cervical spine
were also generated.

[Series 8: axial recon · axial · 0.23mm/px · z∈[-348,-178]mm · 8 of 112 slices shown, 10 images]
[im 11/112  brain]
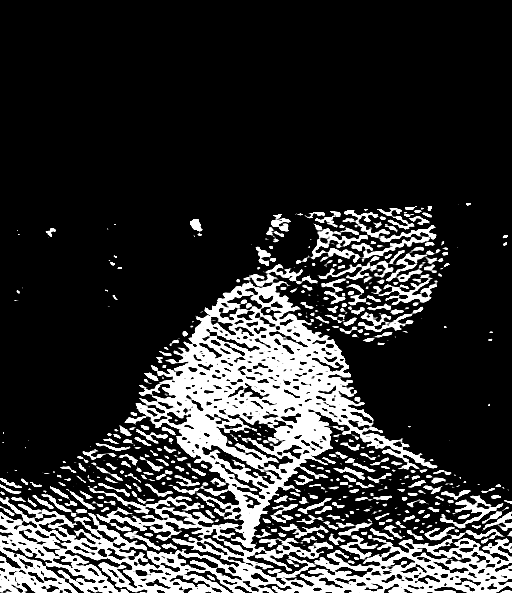
[im 11/112  bone]
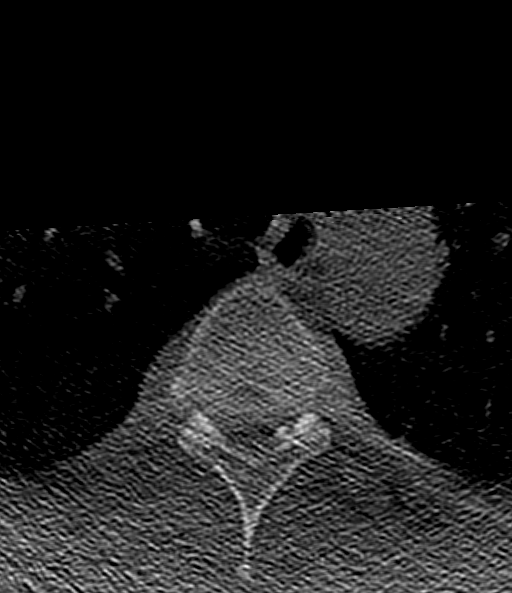
[im 21/112  brain]
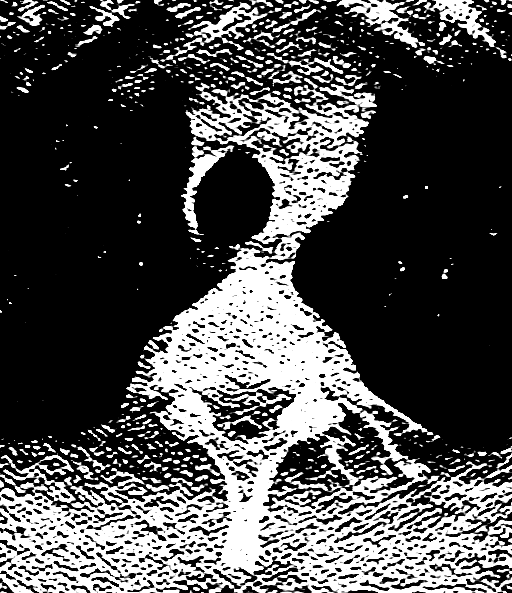
[im 41/112  brain]
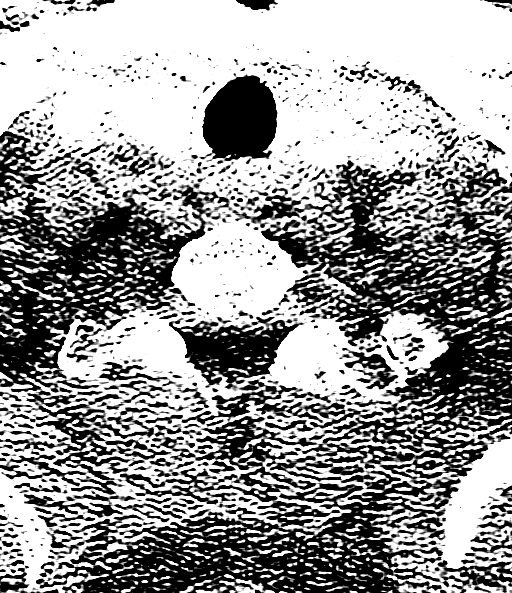
[im 51/112  brain]
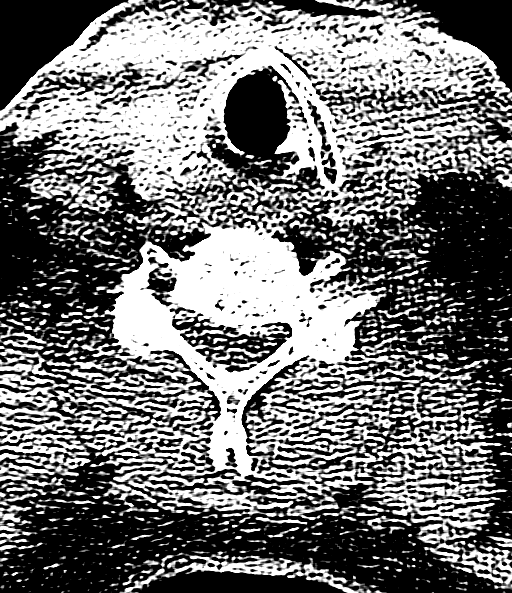
[im 61/112  brain]
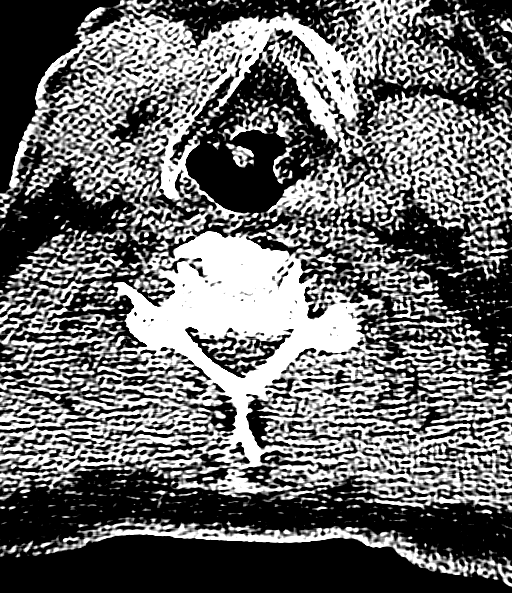
[im 61/112  bone]
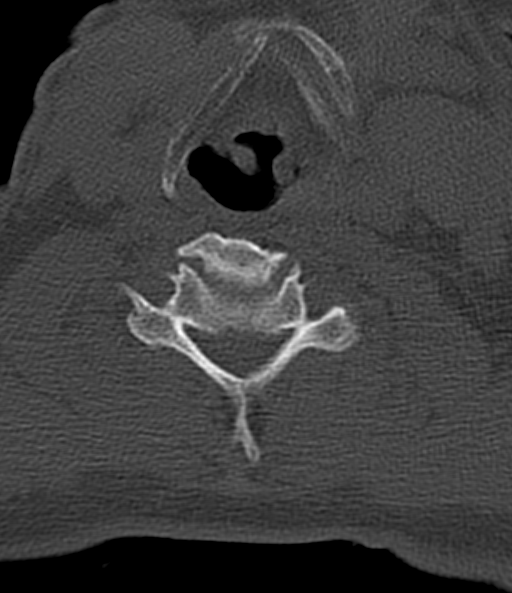
[im 71/112  brain]
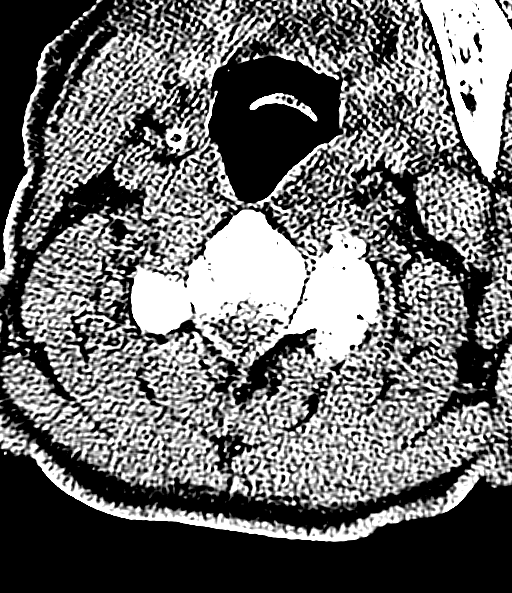
[im 91/112  brain]
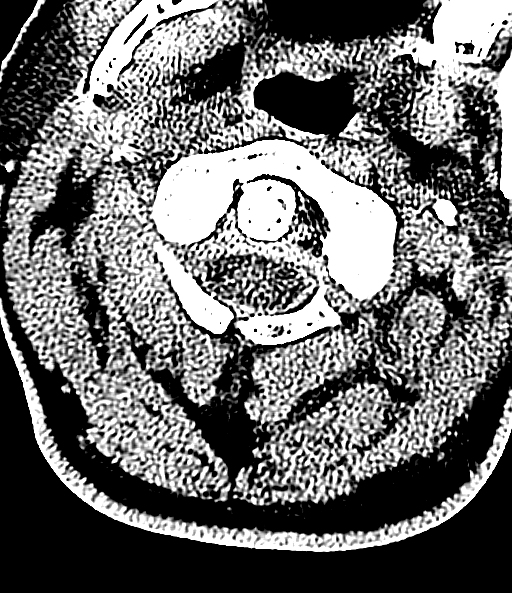
[im 101/112  brain]
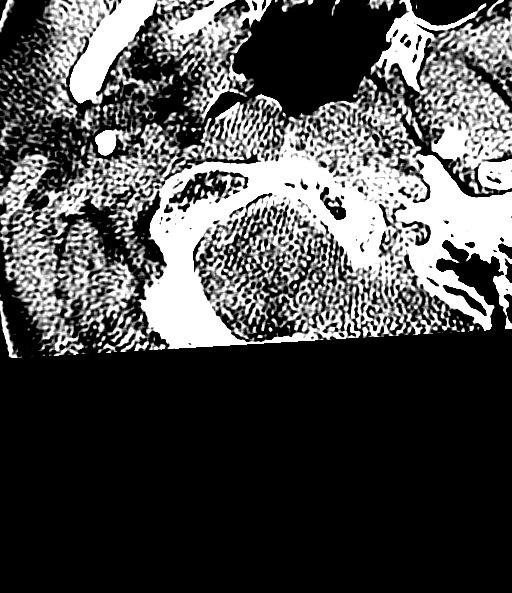

[Series 9: coronal · coronal · 0.33mm/px · 3 of 86 slices shown]
[im 29/86  brain]
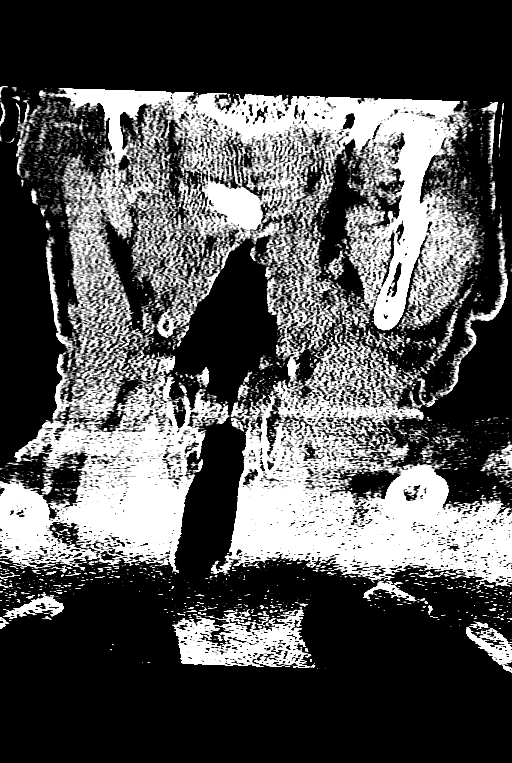
[im 38/86  brain]
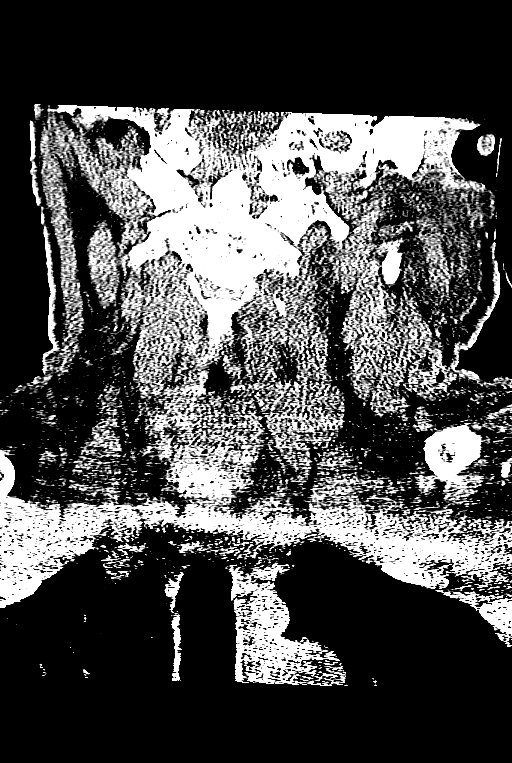
[im 48/86  brain]
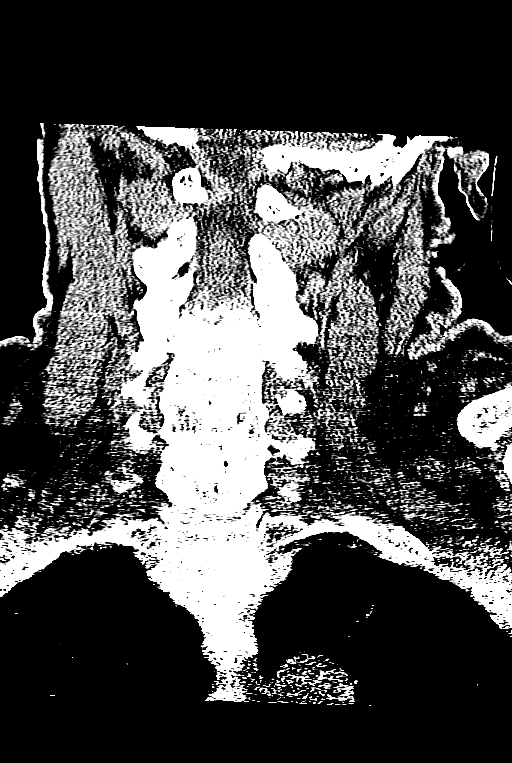

[Series 10: sagittal · sagittal · 0.42mm/px · 3 of 86 slices shown]
[im 29/86  brain]
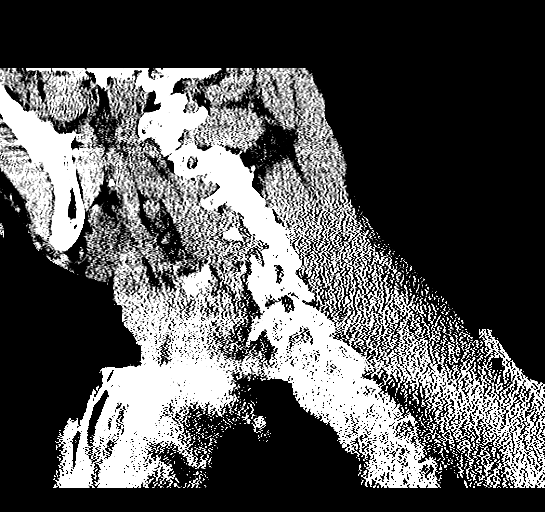
[im 43/86  brain]
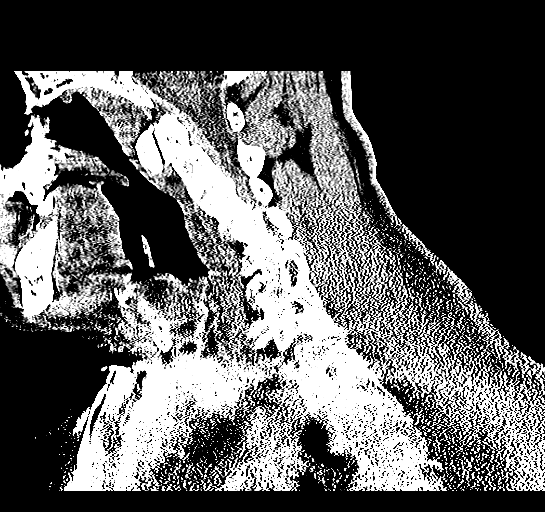
[im 57/86  brain]
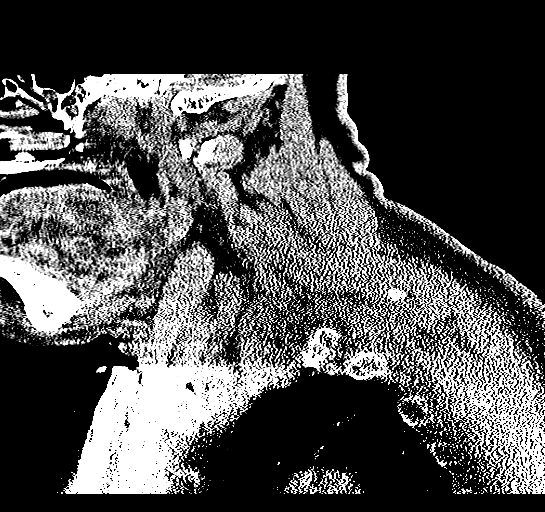

[14 of 47 positions shown; findings below may reference images not displayed]

FINDINGS: CT HEAD FINDINGS

Ventricles, cisterns and other CSF spaces are within normal. There
is no mass, mass effect, shift of midline structures or acute
hemorrhage. There is no evidence of acute infarction. Bones soft
tissues are within normal.

CT CERVICAL SPINE FINDINGS

There is mild reversal of the normal cervical lordosis. There is
fusion of the C5 and C6 vertebral bodies. There is mild to moderate
spondylosis throughout the cervical spine. Moderate disc space
narrowing at the C4-5 level and C6-7 levels. Prevertebral soft
tissues are within normal. There is moderate uncovertebral joint
spurring and facet arthropathy. Non fusion of the midline posterior
arch of C1. Moderate severe bilateral neural foraminal narrowing at
multiple levels due to adjacent bony spurring. There is no acute
fracture or subluxation. Remainder of the exam is unremarkable.
IMPRESSION: No acute intracranial findings.

No acute cervical spine injury.

Mild to moderate spondylosis of the cervical spinal with multilevel
disc disease and fusion of the C5 and C6 vertebral bodies. Moderate
to severe bilateral neural foraminal narrowing at multiple levels
due to adjacent bony spurring. Reversal of the normal cervical
lordosis.

## 2018-02-06 DIAGNOSIS — Z01812 Encounter for preprocedural laboratory examination: Secondary | ICD-10-CM | POA: Diagnosis not present

## 2018-02-06 DIAGNOSIS — Z01810 Encounter for preprocedural cardiovascular examination: Secondary | ICD-10-CM | POA: Diagnosis not present

## 2018-02-10 DIAGNOSIS — R627 Adult failure to thrive: Secondary | ICD-10-CM | POA: Diagnosis not present

## 2018-02-27 DIAGNOSIS — R600 Localized edema: Secondary | ICD-10-CM | POA: Diagnosis not present

## 2018-02-27 DIAGNOSIS — R627 Adult failure to thrive: Secondary | ICD-10-CM | POA: Diagnosis not present

## 2018-03-04 DIAGNOSIS — K5900 Constipation, unspecified: Secondary | ICD-10-CM | POA: Diagnosis not present

## 2018-03-04 DIAGNOSIS — R600 Localized edema: Secondary | ICD-10-CM | POA: Diagnosis not present

## 2018-03-11 DIAGNOSIS — D649 Anemia, unspecified: Secondary | ICD-10-CM | POA: Diagnosis not present

## 2018-03-11 DIAGNOSIS — R194 Change in bowel habit: Secondary | ICD-10-CM | POA: Diagnosis not present

## 2018-03-11 DIAGNOSIS — K625 Hemorrhage of anus and rectum: Secondary | ICD-10-CM | POA: Diagnosis not present

## 2018-03-11 DIAGNOSIS — K921 Melena: Secondary | ICD-10-CM | POA: Diagnosis not present

## 2018-03-12 DIAGNOSIS — Z79899 Other long term (current) drug therapy: Secondary | ICD-10-CM | POA: Diagnosis not present

## 2018-04-07 DIAGNOSIS — R627 Adult failure to thrive: Secondary | ICD-10-CM | POA: Diagnosis not present

## 2018-04-08 DIAGNOSIS — E876 Hypokalemia: Secondary | ICD-10-CM | POA: Diagnosis not present

## 2018-04-10 DIAGNOSIS — K219 Gastro-esophageal reflux disease without esophagitis: Secondary | ICD-10-CM | POA: Diagnosis not present

## 2018-04-14 DIAGNOSIS — I10 Essential (primary) hypertension: Secondary | ICD-10-CM | POA: Diagnosis not present

## 2018-04-14 DIAGNOSIS — R627 Adult failure to thrive: Secondary | ICD-10-CM | POA: Diagnosis not present

## 2018-05-08 DIAGNOSIS — E559 Vitamin D deficiency, unspecified: Secondary | ICD-10-CM | POA: Diagnosis not present

## 2018-05-08 DIAGNOSIS — I10 Essential (primary) hypertension: Secondary | ICD-10-CM | POA: Diagnosis not present

## 2018-05-08 DIAGNOSIS — E876 Hypokalemia: Secondary | ICD-10-CM | POA: Diagnosis not present

## 2018-05-19 DIAGNOSIS — Z79899 Other long term (current) drug therapy: Secondary | ICD-10-CM | POA: Diagnosis not present

## 2018-07-01 DIAGNOSIS — I10 Essential (primary) hypertension: Secondary | ICD-10-CM | POA: Diagnosis not present

## 2018-07-01 DIAGNOSIS — E876 Hypokalemia: Secondary | ICD-10-CM | POA: Diagnosis not present

## 2018-07-15 DIAGNOSIS — I10 Essential (primary) hypertension: Secondary | ICD-10-CM | POA: Diagnosis not present

## 2018-07-15 DIAGNOSIS — J449 Chronic obstructive pulmonary disease, unspecified: Secondary | ICD-10-CM | POA: Diagnosis not present

## 2018-07-26 DIAGNOSIS — R2681 Unsteadiness on feet: Secondary | ICD-10-CM | POA: Diagnosis not present

## 2018-07-26 DIAGNOSIS — R262 Difficulty in walking, not elsewhere classified: Secondary | ICD-10-CM | POA: Diagnosis not present

## 2018-07-26 DIAGNOSIS — M6281 Muscle weakness (generalized): Secondary | ICD-10-CM | POA: Diagnosis not present

## 2018-07-29 DIAGNOSIS — K5900 Constipation, unspecified: Secondary | ICD-10-CM | POA: Diagnosis not present

## 2018-07-29 DIAGNOSIS — R2681 Unsteadiness on feet: Secondary | ICD-10-CM | POA: Diagnosis not present

## 2018-07-29 DIAGNOSIS — M6281 Muscle weakness (generalized): Secondary | ICD-10-CM | POA: Diagnosis not present

## 2018-07-29 DIAGNOSIS — R262 Difficulty in walking, not elsewhere classified: Secondary | ICD-10-CM | POA: Diagnosis not present

## 2018-07-29 DIAGNOSIS — E559 Vitamin D deficiency, unspecified: Secondary | ICD-10-CM | POA: Diagnosis not present

## 2018-07-30 DIAGNOSIS — R2681 Unsteadiness on feet: Secondary | ICD-10-CM | POA: Diagnosis not present

## 2018-07-30 DIAGNOSIS — R262 Difficulty in walking, not elsewhere classified: Secondary | ICD-10-CM | POA: Diagnosis not present

## 2018-07-30 DIAGNOSIS — M6281 Muscle weakness (generalized): Secondary | ICD-10-CM | POA: Diagnosis not present

## 2018-07-31 DIAGNOSIS — R2681 Unsteadiness on feet: Secondary | ICD-10-CM | POA: Diagnosis not present

## 2018-07-31 DIAGNOSIS — R262 Difficulty in walking, not elsewhere classified: Secondary | ICD-10-CM | POA: Diagnosis not present

## 2018-07-31 DIAGNOSIS — M6281 Muscle weakness (generalized): Secondary | ICD-10-CM | POA: Diagnosis not present

## 2018-08-01 DIAGNOSIS — R262 Difficulty in walking, not elsewhere classified: Secondary | ICD-10-CM | POA: Diagnosis not present

## 2018-08-01 DIAGNOSIS — R2681 Unsteadiness on feet: Secondary | ICD-10-CM | POA: Diagnosis not present

## 2018-08-01 DIAGNOSIS — M6281 Muscle weakness (generalized): Secondary | ICD-10-CM | POA: Diagnosis not present

## 2018-08-04 DIAGNOSIS — R262 Difficulty in walking, not elsewhere classified: Secondary | ICD-10-CM | POA: Diagnosis not present

## 2018-08-04 DIAGNOSIS — R2681 Unsteadiness on feet: Secondary | ICD-10-CM | POA: Diagnosis not present

## 2018-08-04 DIAGNOSIS — M6281 Muscle weakness (generalized): Secondary | ICD-10-CM | POA: Diagnosis not present

## 2018-08-05 DIAGNOSIS — R2681 Unsteadiness on feet: Secondary | ICD-10-CM | POA: Diagnosis not present

## 2018-08-05 DIAGNOSIS — R262 Difficulty in walking, not elsewhere classified: Secondary | ICD-10-CM | POA: Diagnosis not present

## 2018-08-05 DIAGNOSIS — M6281 Muscle weakness (generalized): Secondary | ICD-10-CM | POA: Diagnosis not present

## 2018-08-06 DIAGNOSIS — R2681 Unsteadiness on feet: Secondary | ICD-10-CM | POA: Diagnosis not present

## 2018-08-06 DIAGNOSIS — R262 Difficulty in walking, not elsewhere classified: Secondary | ICD-10-CM | POA: Diagnosis not present

## 2018-08-06 DIAGNOSIS — M6281 Muscle weakness (generalized): Secondary | ICD-10-CM | POA: Diagnosis not present

## 2018-08-07 DIAGNOSIS — M6281 Muscle weakness (generalized): Secondary | ICD-10-CM | POA: Diagnosis not present

## 2018-08-07 DIAGNOSIS — R262 Difficulty in walking, not elsewhere classified: Secondary | ICD-10-CM | POA: Diagnosis not present

## 2018-08-07 DIAGNOSIS — R2681 Unsteadiness on feet: Secondary | ICD-10-CM | POA: Diagnosis not present

## 2018-08-08 DIAGNOSIS — R2681 Unsteadiness on feet: Secondary | ICD-10-CM | POA: Diagnosis not present

## 2018-08-08 DIAGNOSIS — R262 Difficulty in walking, not elsewhere classified: Secondary | ICD-10-CM | POA: Diagnosis not present

## 2018-08-08 DIAGNOSIS — M6281 Muscle weakness (generalized): Secondary | ICD-10-CM | POA: Diagnosis not present

## 2018-08-11 DIAGNOSIS — M6281 Muscle weakness (generalized): Secondary | ICD-10-CM | POA: Diagnosis not present

## 2018-08-11 DIAGNOSIS — R2681 Unsteadiness on feet: Secondary | ICD-10-CM | POA: Diagnosis not present

## 2018-08-11 DIAGNOSIS — R262 Difficulty in walking, not elsewhere classified: Secondary | ICD-10-CM | POA: Diagnosis not present

## 2018-08-12 DIAGNOSIS — M545 Low back pain: Secondary | ICD-10-CM | POA: Diagnosis not present

## 2018-08-12 DIAGNOSIS — R2681 Unsteadiness on feet: Secondary | ICD-10-CM | POA: Diagnosis not present

## 2018-08-12 DIAGNOSIS — D649 Anemia, unspecified: Secondary | ICD-10-CM | POA: Diagnosis not present

## 2018-08-12 DIAGNOSIS — M48061 Spinal stenosis, lumbar region without neurogenic claudication: Secondary | ICD-10-CM | POA: Diagnosis not present

## 2018-08-12 DIAGNOSIS — E876 Hypokalemia: Secondary | ICD-10-CM | POA: Diagnosis not present

## 2018-08-12 DIAGNOSIS — E559 Vitamin D deficiency, unspecified: Secondary | ICD-10-CM | POA: Diagnosis not present

## 2018-08-13 DIAGNOSIS — R2681 Unsteadiness on feet: Secondary | ICD-10-CM | POA: Diagnosis not present

## 2018-08-13 DIAGNOSIS — E876 Hypokalemia: Secondary | ICD-10-CM | POA: Diagnosis not present

## 2018-08-13 DIAGNOSIS — E559 Vitamin D deficiency, unspecified: Secondary | ICD-10-CM | POA: Diagnosis not present

## 2018-08-13 DIAGNOSIS — D649 Anemia, unspecified: Secondary | ICD-10-CM | POA: Diagnosis not present

## 2018-08-13 DIAGNOSIS — M545 Low back pain: Secondary | ICD-10-CM | POA: Diagnosis not present

## 2018-08-13 DIAGNOSIS — M48061 Spinal stenosis, lumbar region without neurogenic claudication: Secondary | ICD-10-CM | POA: Diagnosis not present

## 2018-08-14 DIAGNOSIS — R2681 Unsteadiness on feet: Secondary | ICD-10-CM | POA: Diagnosis not present

## 2018-08-14 DIAGNOSIS — M48061 Spinal stenosis, lumbar region without neurogenic claudication: Secondary | ICD-10-CM | POA: Diagnosis not present

## 2018-08-14 DIAGNOSIS — E559 Vitamin D deficiency, unspecified: Secondary | ICD-10-CM | POA: Diagnosis not present

## 2018-08-14 DIAGNOSIS — E876 Hypokalemia: Secondary | ICD-10-CM | POA: Diagnosis not present

## 2018-08-14 DIAGNOSIS — D649 Anemia, unspecified: Secondary | ICD-10-CM | POA: Diagnosis not present

## 2018-08-14 DIAGNOSIS — M545 Low back pain: Secondary | ICD-10-CM | POA: Diagnosis not present

## 2018-08-15 DIAGNOSIS — D649 Anemia, unspecified: Secondary | ICD-10-CM | POA: Diagnosis not present

## 2018-08-15 DIAGNOSIS — E876 Hypokalemia: Secondary | ICD-10-CM | POA: Diagnosis not present

## 2018-08-15 DIAGNOSIS — M545 Low back pain: Secondary | ICD-10-CM | POA: Diagnosis not present

## 2018-08-15 DIAGNOSIS — M48061 Spinal stenosis, lumbar region without neurogenic claudication: Secondary | ICD-10-CM | POA: Diagnosis not present

## 2018-08-15 DIAGNOSIS — R2681 Unsteadiness on feet: Secondary | ICD-10-CM | POA: Diagnosis not present

## 2018-08-15 DIAGNOSIS — E559 Vitamin D deficiency, unspecified: Secondary | ICD-10-CM | POA: Diagnosis not present

## 2018-08-18 DIAGNOSIS — E876 Hypokalemia: Secondary | ICD-10-CM | POA: Diagnosis not present

## 2018-08-18 DIAGNOSIS — M545 Low back pain: Secondary | ICD-10-CM | POA: Diagnosis not present

## 2018-08-18 DIAGNOSIS — R2681 Unsteadiness on feet: Secondary | ICD-10-CM | POA: Diagnosis not present

## 2018-08-18 DIAGNOSIS — E559 Vitamin D deficiency, unspecified: Secondary | ICD-10-CM | POA: Diagnosis not present

## 2018-08-18 DIAGNOSIS — M48061 Spinal stenosis, lumbar region without neurogenic claudication: Secondary | ICD-10-CM | POA: Diagnosis not present

## 2018-08-18 DIAGNOSIS — D649 Anemia, unspecified: Secondary | ICD-10-CM | POA: Diagnosis not present

## 2018-08-19 DIAGNOSIS — R2681 Unsteadiness on feet: Secondary | ICD-10-CM | POA: Diagnosis not present

## 2018-08-19 DIAGNOSIS — M545 Low back pain: Secondary | ICD-10-CM | POA: Diagnosis not present

## 2018-08-19 DIAGNOSIS — E559 Vitamin D deficiency, unspecified: Secondary | ICD-10-CM | POA: Diagnosis not present

## 2018-08-19 DIAGNOSIS — E876 Hypokalemia: Secondary | ICD-10-CM | POA: Diagnosis not present

## 2018-08-19 DIAGNOSIS — D649 Anemia, unspecified: Secondary | ICD-10-CM | POA: Diagnosis not present

## 2018-08-19 DIAGNOSIS — M48061 Spinal stenosis, lumbar region without neurogenic claudication: Secondary | ICD-10-CM | POA: Diagnosis not present

## 2018-08-20 DIAGNOSIS — E876 Hypokalemia: Secondary | ICD-10-CM | POA: Diagnosis not present

## 2018-08-20 DIAGNOSIS — D649 Anemia, unspecified: Secondary | ICD-10-CM | POA: Diagnosis not present

## 2018-08-20 DIAGNOSIS — E559 Vitamin D deficiency, unspecified: Secondary | ICD-10-CM | POA: Diagnosis not present

## 2018-08-20 DIAGNOSIS — M48061 Spinal stenosis, lumbar region without neurogenic claudication: Secondary | ICD-10-CM | POA: Diagnosis not present

## 2018-08-20 DIAGNOSIS — M545 Low back pain: Secondary | ICD-10-CM | POA: Diagnosis not present

## 2018-08-20 DIAGNOSIS — R2681 Unsteadiness on feet: Secondary | ICD-10-CM | POA: Diagnosis not present

## 2018-08-21 DIAGNOSIS — D649 Anemia, unspecified: Secondary | ICD-10-CM | POA: Diagnosis not present

## 2018-08-21 DIAGNOSIS — E876 Hypokalemia: Secondary | ICD-10-CM | POA: Diagnosis not present

## 2018-08-21 DIAGNOSIS — M545 Low back pain: Secondary | ICD-10-CM | POA: Diagnosis not present

## 2018-08-21 DIAGNOSIS — E559 Vitamin D deficiency, unspecified: Secondary | ICD-10-CM | POA: Diagnosis not present

## 2018-08-21 DIAGNOSIS — M48061 Spinal stenosis, lumbar region without neurogenic claudication: Secondary | ICD-10-CM | POA: Diagnosis not present

## 2018-08-21 DIAGNOSIS — R2681 Unsteadiness on feet: Secondary | ICD-10-CM | POA: Diagnosis not present

## 2018-08-22 DIAGNOSIS — R2681 Unsteadiness on feet: Secondary | ICD-10-CM | POA: Diagnosis not present

## 2018-08-22 DIAGNOSIS — D649 Anemia, unspecified: Secondary | ICD-10-CM | POA: Diagnosis not present

## 2018-08-22 DIAGNOSIS — E559 Vitamin D deficiency, unspecified: Secondary | ICD-10-CM | POA: Diagnosis not present

## 2018-08-22 DIAGNOSIS — E876 Hypokalemia: Secondary | ICD-10-CM | POA: Diagnosis not present

## 2018-08-22 DIAGNOSIS — M545 Low back pain: Secondary | ICD-10-CM | POA: Diagnosis not present

## 2018-08-22 DIAGNOSIS — M48061 Spinal stenosis, lumbar region without neurogenic claudication: Secondary | ICD-10-CM | POA: Diagnosis not present

## 2018-08-29 DIAGNOSIS — E559 Vitamin D deficiency, unspecified: Secondary | ICD-10-CM | POA: Diagnosis not present

## 2018-08-29 DIAGNOSIS — I10 Essential (primary) hypertension: Secondary | ICD-10-CM | POA: Diagnosis not present

## 2018-10-02 DIAGNOSIS — E559 Vitamin D deficiency, unspecified: Secondary | ICD-10-CM | POA: Diagnosis not present

## 2018-10-02 DIAGNOSIS — I10 Essential (primary) hypertension: Secondary | ICD-10-CM | POA: Diagnosis not present

## 2018-10-13 DIAGNOSIS — J449 Chronic obstructive pulmonary disease, unspecified: Secondary | ICD-10-CM | POA: Diagnosis not present

## 2018-10-13 DIAGNOSIS — I10 Essential (primary) hypertension: Secondary | ICD-10-CM | POA: Diagnosis not present

## 2018-10-16 DIAGNOSIS — Z79899 Other long term (current) drug therapy: Secondary | ICD-10-CM | POA: Diagnosis not present

## 2018-11-03 DIAGNOSIS — R600 Localized edema: Secondary | ICD-10-CM | POA: Diagnosis not present

## 2018-11-03 DIAGNOSIS — Z79899 Other long term (current) drug therapy: Secondary | ICD-10-CM | POA: Diagnosis not present

## 2018-11-03 DIAGNOSIS — E876 Hypokalemia: Secondary | ICD-10-CM | POA: Diagnosis not present

## 2018-11-03 DIAGNOSIS — E559 Vitamin D deficiency, unspecified: Secondary | ICD-10-CM | POA: Diagnosis not present

## 2018-11-20 DIAGNOSIS — Z79899 Other long term (current) drug therapy: Secondary | ICD-10-CM | POA: Diagnosis not present

## 2018-11-25 DIAGNOSIS — E559 Vitamin D deficiency, unspecified: Secondary | ICD-10-CM | POA: Diagnosis not present

## 2018-12-09 DIAGNOSIS — M2550 Pain in unspecified joint: Secondary | ICD-10-CM | POA: Diagnosis not present

## 2018-12-09 DIAGNOSIS — G4700 Insomnia, unspecified: Secondary | ICD-10-CM | POA: Diagnosis not present

## 2018-12-11 DIAGNOSIS — I509 Heart failure, unspecified: Secondary | ICD-10-CM | POA: Diagnosis not present

## 2018-12-11 DIAGNOSIS — E876 Hypokalemia: Secondary | ICD-10-CM | POA: Diagnosis not present

## 2018-12-19 DIAGNOSIS — R2681 Unsteadiness on feet: Secondary | ICD-10-CM | POA: Diagnosis not present

## 2018-12-19 DIAGNOSIS — R278 Other lack of coordination: Secondary | ICD-10-CM | POA: Diagnosis not present

## 2018-12-19 DIAGNOSIS — M6281 Muscle weakness (generalized): Secondary | ICD-10-CM | POA: Diagnosis not present

## 2018-12-19 DIAGNOSIS — I5020 Unspecified systolic (congestive) heart failure: Secondary | ICD-10-CM | POA: Diagnosis not present

## 2018-12-19 DIAGNOSIS — Z741 Need for assistance with personal care: Secondary | ICD-10-CM | POA: Diagnosis not present

## 2018-12-19 DIAGNOSIS — E559 Vitamin D deficiency, unspecified: Secondary | ICD-10-CM | POA: Diagnosis not present

## 2018-12-19 DIAGNOSIS — M545 Low back pain: Secondary | ICD-10-CM | POA: Diagnosis not present

## 2018-12-22 DIAGNOSIS — M545 Low back pain: Secondary | ICD-10-CM | POA: Diagnosis not present

## 2018-12-22 DIAGNOSIS — E559 Vitamin D deficiency, unspecified: Secondary | ICD-10-CM | POA: Diagnosis not present

## 2018-12-22 DIAGNOSIS — I5020 Unspecified systolic (congestive) heart failure: Secondary | ICD-10-CM | POA: Diagnosis not present

## 2018-12-22 DIAGNOSIS — R2681 Unsteadiness on feet: Secondary | ICD-10-CM | POA: Diagnosis not present

## 2018-12-22 DIAGNOSIS — Z741 Need for assistance with personal care: Secondary | ICD-10-CM | POA: Diagnosis not present

## 2018-12-22 DIAGNOSIS — R278 Other lack of coordination: Secondary | ICD-10-CM | POA: Diagnosis not present

## 2018-12-22 DIAGNOSIS — M6281 Muscle weakness (generalized): Secondary | ICD-10-CM | POA: Diagnosis not present

## 2018-12-23 DIAGNOSIS — I5020 Unspecified systolic (congestive) heart failure: Secondary | ICD-10-CM | POA: Diagnosis not present

## 2018-12-23 DIAGNOSIS — E559 Vitamin D deficiency, unspecified: Secondary | ICD-10-CM | POA: Diagnosis not present

## 2018-12-23 DIAGNOSIS — Z741 Need for assistance with personal care: Secondary | ICD-10-CM | POA: Diagnosis not present

## 2018-12-23 DIAGNOSIS — M545 Low back pain: Secondary | ICD-10-CM | POA: Diagnosis not present

## 2018-12-23 DIAGNOSIS — R2681 Unsteadiness on feet: Secondary | ICD-10-CM | POA: Diagnosis not present

## 2018-12-23 DIAGNOSIS — R278 Other lack of coordination: Secondary | ICD-10-CM | POA: Diagnosis not present

## 2018-12-23 DIAGNOSIS — M6281 Muscle weakness (generalized): Secondary | ICD-10-CM | POA: Diagnosis not present

## 2018-12-24 DIAGNOSIS — R2681 Unsteadiness on feet: Secondary | ICD-10-CM | POA: Diagnosis not present

## 2018-12-24 DIAGNOSIS — M545 Low back pain: Secondary | ICD-10-CM | POA: Diagnosis not present

## 2018-12-24 DIAGNOSIS — E559 Vitamin D deficiency, unspecified: Secondary | ICD-10-CM | POA: Diagnosis not present

## 2018-12-24 DIAGNOSIS — M6281 Muscle weakness (generalized): Secondary | ICD-10-CM | POA: Diagnosis not present

## 2018-12-24 DIAGNOSIS — I5020 Unspecified systolic (congestive) heart failure: Secondary | ICD-10-CM | POA: Diagnosis not present

## 2018-12-24 DIAGNOSIS — Z741 Need for assistance with personal care: Secondary | ICD-10-CM | POA: Diagnosis not present

## 2018-12-24 DIAGNOSIS — R278 Other lack of coordination: Secondary | ICD-10-CM | POA: Diagnosis not present

## 2018-12-25 DIAGNOSIS — I5020 Unspecified systolic (congestive) heart failure: Secondary | ICD-10-CM | POA: Diagnosis not present

## 2018-12-25 DIAGNOSIS — M545 Low back pain: Secondary | ICD-10-CM | POA: Diagnosis not present

## 2018-12-25 DIAGNOSIS — Z741 Need for assistance with personal care: Secondary | ICD-10-CM | POA: Diagnosis not present

## 2018-12-25 DIAGNOSIS — M6281 Muscle weakness (generalized): Secondary | ICD-10-CM | POA: Diagnosis not present

## 2018-12-25 DIAGNOSIS — R278 Other lack of coordination: Secondary | ICD-10-CM | POA: Diagnosis not present

## 2018-12-25 DIAGNOSIS — R2681 Unsteadiness on feet: Secondary | ICD-10-CM | POA: Diagnosis not present

## 2018-12-25 DIAGNOSIS — E559 Vitamin D deficiency, unspecified: Secondary | ICD-10-CM | POA: Diagnosis not present

## 2018-12-26 DIAGNOSIS — R278 Other lack of coordination: Secondary | ICD-10-CM | POA: Diagnosis not present

## 2018-12-26 DIAGNOSIS — I5020 Unspecified systolic (congestive) heart failure: Secondary | ICD-10-CM | POA: Diagnosis not present

## 2018-12-26 DIAGNOSIS — R2681 Unsteadiness on feet: Secondary | ICD-10-CM | POA: Diagnosis not present

## 2018-12-26 DIAGNOSIS — M6281 Muscle weakness (generalized): Secondary | ICD-10-CM | POA: Diagnosis not present

## 2018-12-26 DIAGNOSIS — M545 Low back pain: Secondary | ICD-10-CM | POA: Diagnosis not present

## 2018-12-26 DIAGNOSIS — E559 Vitamin D deficiency, unspecified: Secondary | ICD-10-CM | POA: Diagnosis not present

## 2018-12-26 DIAGNOSIS — Z741 Need for assistance with personal care: Secondary | ICD-10-CM | POA: Diagnosis not present

## 2018-12-29 DIAGNOSIS — R278 Other lack of coordination: Secondary | ICD-10-CM | POA: Diagnosis not present

## 2018-12-29 DIAGNOSIS — I5020 Unspecified systolic (congestive) heart failure: Secondary | ICD-10-CM | POA: Diagnosis not present

## 2018-12-29 DIAGNOSIS — R2681 Unsteadiness on feet: Secondary | ICD-10-CM | POA: Diagnosis not present

## 2018-12-29 DIAGNOSIS — M545 Low back pain: Secondary | ICD-10-CM | POA: Diagnosis not present

## 2018-12-29 DIAGNOSIS — E559 Vitamin D deficiency, unspecified: Secondary | ICD-10-CM | POA: Diagnosis not present

## 2018-12-29 DIAGNOSIS — J449 Chronic obstructive pulmonary disease, unspecified: Secondary | ICD-10-CM | POA: Diagnosis not present

## 2018-12-29 DIAGNOSIS — Z741 Need for assistance with personal care: Secondary | ICD-10-CM | POA: Diagnosis not present

## 2018-12-29 DIAGNOSIS — M6281 Muscle weakness (generalized): Secondary | ICD-10-CM | POA: Diagnosis not present

## 2018-12-30 DIAGNOSIS — M6281 Muscle weakness (generalized): Secondary | ICD-10-CM | POA: Diagnosis not present

## 2018-12-30 DIAGNOSIS — R278 Other lack of coordination: Secondary | ICD-10-CM | POA: Diagnosis not present

## 2018-12-30 DIAGNOSIS — E559 Vitamin D deficiency, unspecified: Secondary | ICD-10-CM | POA: Diagnosis not present

## 2018-12-30 DIAGNOSIS — I5020 Unspecified systolic (congestive) heart failure: Secondary | ICD-10-CM | POA: Diagnosis not present

## 2018-12-30 DIAGNOSIS — M545 Low back pain: Secondary | ICD-10-CM | POA: Diagnosis not present

## 2018-12-30 DIAGNOSIS — Z741 Need for assistance with personal care: Secondary | ICD-10-CM | POA: Diagnosis not present

## 2018-12-30 DIAGNOSIS — R2681 Unsteadiness on feet: Secondary | ICD-10-CM | POA: Diagnosis not present

## 2018-12-31 DIAGNOSIS — M6281 Muscle weakness (generalized): Secondary | ICD-10-CM | POA: Diagnosis not present

## 2018-12-31 DIAGNOSIS — E559 Vitamin D deficiency, unspecified: Secondary | ICD-10-CM | POA: Diagnosis not present

## 2018-12-31 DIAGNOSIS — M545 Low back pain: Secondary | ICD-10-CM | POA: Diagnosis not present

## 2018-12-31 DIAGNOSIS — I5020 Unspecified systolic (congestive) heart failure: Secondary | ICD-10-CM | POA: Diagnosis not present

## 2018-12-31 DIAGNOSIS — R278 Other lack of coordination: Secondary | ICD-10-CM | POA: Diagnosis not present

## 2018-12-31 DIAGNOSIS — Z741 Need for assistance with personal care: Secondary | ICD-10-CM | POA: Diagnosis not present

## 2018-12-31 DIAGNOSIS — R2681 Unsteadiness on feet: Secondary | ICD-10-CM | POA: Diagnosis not present

## 2019-01-01 DIAGNOSIS — I5020 Unspecified systolic (congestive) heart failure: Secondary | ICD-10-CM | POA: Diagnosis not present

## 2019-01-01 DIAGNOSIS — Z741 Need for assistance with personal care: Secondary | ICD-10-CM | POA: Diagnosis not present

## 2019-01-01 DIAGNOSIS — R278 Other lack of coordination: Secondary | ICD-10-CM | POA: Diagnosis not present

## 2019-01-01 DIAGNOSIS — M545 Low back pain: Secondary | ICD-10-CM | POA: Diagnosis not present

## 2019-01-01 DIAGNOSIS — M6281 Muscle weakness (generalized): Secondary | ICD-10-CM | POA: Diagnosis not present

## 2019-01-01 DIAGNOSIS — E559 Vitamin D deficiency, unspecified: Secondary | ICD-10-CM | POA: Diagnosis not present

## 2019-01-01 DIAGNOSIS — R2681 Unsteadiness on feet: Secondary | ICD-10-CM | POA: Diagnosis not present

## 2019-01-02 DIAGNOSIS — I5020 Unspecified systolic (congestive) heart failure: Secondary | ICD-10-CM | POA: Diagnosis not present

## 2019-01-02 DIAGNOSIS — R2681 Unsteadiness on feet: Secondary | ICD-10-CM | POA: Diagnosis not present

## 2019-01-02 DIAGNOSIS — R278 Other lack of coordination: Secondary | ICD-10-CM | POA: Diagnosis not present

## 2019-01-02 DIAGNOSIS — M6281 Muscle weakness (generalized): Secondary | ICD-10-CM | POA: Diagnosis not present

## 2019-01-02 DIAGNOSIS — M545 Low back pain: Secondary | ICD-10-CM | POA: Diagnosis not present

## 2019-01-02 DIAGNOSIS — E559 Vitamin D deficiency, unspecified: Secondary | ICD-10-CM | POA: Diagnosis not present

## 2019-01-02 DIAGNOSIS — Z741 Need for assistance with personal care: Secondary | ICD-10-CM | POA: Diagnosis not present

## 2019-01-03 DIAGNOSIS — I5020 Unspecified systolic (congestive) heart failure: Secondary | ICD-10-CM | POA: Diagnosis not present

## 2019-01-03 DIAGNOSIS — R2681 Unsteadiness on feet: Secondary | ICD-10-CM | POA: Diagnosis not present

## 2019-01-03 DIAGNOSIS — M545 Low back pain: Secondary | ICD-10-CM | POA: Diagnosis not present

## 2019-01-03 DIAGNOSIS — E559 Vitamin D deficiency, unspecified: Secondary | ICD-10-CM | POA: Diagnosis not present

## 2019-01-03 DIAGNOSIS — M6281 Muscle weakness (generalized): Secondary | ICD-10-CM | POA: Diagnosis not present

## 2019-01-03 DIAGNOSIS — R278 Other lack of coordination: Secondary | ICD-10-CM | POA: Diagnosis not present

## 2019-01-03 DIAGNOSIS — Z741 Need for assistance with personal care: Secondary | ICD-10-CM | POA: Diagnosis not present

## 2019-01-05 DIAGNOSIS — Z741 Need for assistance with personal care: Secondary | ICD-10-CM | POA: Diagnosis not present

## 2019-01-05 DIAGNOSIS — R2681 Unsteadiness on feet: Secondary | ICD-10-CM | POA: Diagnosis not present

## 2019-01-05 DIAGNOSIS — R278 Other lack of coordination: Secondary | ICD-10-CM | POA: Diagnosis not present

## 2019-01-05 DIAGNOSIS — M545 Low back pain: Secondary | ICD-10-CM | POA: Diagnosis not present

## 2019-01-05 DIAGNOSIS — I5020 Unspecified systolic (congestive) heart failure: Secondary | ICD-10-CM | POA: Diagnosis not present

## 2019-01-05 DIAGNOSIS — M6281 Muscle weakness (generalized): Secondary | ICD-10-CM | POA: Diagnosis not present

## 2019-01-05 DIAGNOSIS — E559 Vitamin D deficiency, unspecified: Secondary | ICD-10-CM | POA: Diagnosis not present

## 2019-01-06 DIAGNOSIS — I5020 Unspecified systolic (congestive) heart failure: Secondary | ICD-10-CM | POA: Diagnosis not present

## 2019-01-06 DIAGNOSIS — R2681 Unsteadiness on feet: Secondary | ICD-10-CM | POA: Diagnosis not present

## 2019-01-06 DIAGNOSIS — M6281 Muscle weakness (generalized): Secondary | ICD-10-CM | POA: Diagnosis not present

## 2019-01-06 DIAGNOSIS — M545 Low back pain: Secondary | ICD-10-CM | POA: Diagnosis not present

## 2019-01-06 DIAGNOSIS — E559 Vitamin D deficiency, unspecified: Secondary | ICD-10-CM | POA: Diagnosis not present

## 2019-01-06 DIAGNOSIS — Z741 Need for assistance with personal care: Secondary | ICD-10-CM | POA: Diagnosis not present

## 2019-01-06 DIAGNOSIS — R278 Other lack of coordination: Secondary | ICD-10-CM | POA: Diagnosis not present

## 2019-01-07 DIAGNOSIS — R278 Other lack of coordination: Secondary | ICD-10-CM | POA: Diagnosis not present

## 2019-01-07 DIAGNOSIS — I5020 Unspecified systolic (congestive) heart failure: Secondary | ICD-10-CM | POA: Diagnosis not present

## 2019-01-07 DIAGNOSIS — R2681 Unsteadiness on feet: Secondary | ICD-10-CM | POA: Diagnosis not present

## 2019-01-07 DIAGNOSIS — E559 Vitamin D deficiency, unspecified: Secondary | ICD-10-CM | POA: Diagnosis not present

## 2019-01-07 DIAGNOSIS — Z741 Need for assistance with personal care: Secondary | ICD-10-CM | POA: Diagnosis not present

## 2019-01-07 DIAGNOSIS — M6281 Muscle weakness (generalized): Secondary | ICD-10-CM | POA: Diagnosis not present

## 2019-01-07 DIAGNOSIS — M545 Low back pain: Secondary | ICD-10-CM | POA: Diagnosis not present

## 2019-01-08 DIAGNOSIS — I5020 Unspecified systolic (congestive) heart failure: Secondary | ICD-10-CM | POA: Diagnosis not present

## 2019-01-08 DIAGNOSIS — R2681 Unsteadiness on feet: Secondary | ICD-10-CM | POA: Diagnosis not present

## 2019-01-08 DIAGNOSIS — R278 Other lack of coordination: Secondary | ICD-10-CM | POA: Diagnosis not present

## 2019-01-08 DIAGNOSIS — M545 Low back pain: Secondary | ICD-10-CM | POA: Diagnosis not present

## 2019-01-08 DIAGNOSIS — Z741 Need for assistance with personal care: Secondary | ICD-10-CM | POA: Diagnosis not present

## 2019-01-08 DIAGNOSIS — E559 Vitamin D deficiency, unspecified: Secondary | ICD-10-CM | POA: Diagnosis not present

## 2019-01-08 DIAGNOSIS — M6281 Muscle weakness (generalized): Secondary | ICD-10-CM | POA: Diagnosis not present

## 2019-01-08 DIAGNOSIS — I509 Heart failure, unspecified: Secondary | ICD-10-CM | POA: Diagnosis not present

## 2019-01-09 DIAGNOSIS — M545 Low back pain: Secondary | ICD-10-CM | POA: Diagnosis not present

## 2019-01-09 DIAGNOSIS — I5020 Unspecified systolic (congestive) heart failure: Secondary | ICD-10-CM | POA: Diagnosis not present

## 2019-01-09 DIAGNOSIS — R278 Other lack of coordination: Secondary | ICD-10-CM | POA: Diagnosis not present

## 2019-01-09 DIAGNOSIS — Z741 Need for assistance with personal care: Secondary | ICD-10-CM | POA: Diagnosis not present

## 2019-01-09 DIAGNOSIS — M6281 Muscle weakness (generalized): Secondary | ICD-10-CM | POA: Diagnosis not present

## 2019-01-09 DIAGNOSIS — R2681 Unsteadiness on feet: Secondary | ICD-10-CM | POA: Diagnosis not present

## 2019-01-09 DIAGNOSIS — E559 Vitamin D deficiency, unspecified: Secondary | ICD-10-CM | POA: Diagnosis not present

## 2019-01-11 DIAGNOSIS — R2689 Other abnormalities of gait and mobility: Secondary | ICD-10-CM | POA: Diagnosis not present

## 2019-01-11 DIAGNOSIS — Z741 Need for assistance with personal care: Secondary | ICD-10-CM | POA: Diagnosis not present

## 2019-01-11 DIAGNOSIS — E559 Vitamin D deficiency, unspecified: Secondary | ICD-10-CM | POA: Diagnosis not present

## 2019-01-11 DIAGNOSIS — M545 Low back pain: Secondary | ICD-10-CM | POA: Diagnosis not present

## 2019-01-11 DIAGNOSIS — M2681 Anterior soft tissue impingement: Secondary | ICD-10-CM | POA: Diagnosis not present

## 2019-01-11 DIAGNOSIS — I5020 Unspecified systolic (congestive) heart failure: Secondary | ICD-10-CM | POA: Diagnosis not present

## 2019-01-11 DIAGNOSIS — R278 Other lack of coordination: Secondary | ICD-10-CM | POA: Diagnosis not present

## 2019-01-12 DIAGNOSIS — M2681 Anterior soft tissue impingement: Secondary | ICD-10-CM | POA: Diagnosis not present

## 2019-01-12 DIAGNOSIS — R2689 Other abnormalities of gait and mobility: Secondary | ICD-10-CM | POA: Diagnosis not present

## 2019-01-12 DIAGNOSIS — E559 Vitamin D deficiency, unspecified: Secondary | ICD-10-CM | POA: Diagnosis not present

## 2019-01-12 DIAGNOSIS — R278 Other lack of coordination: Secondary | ICD-10-CM | POA: Diagnosis not present

## 2019-01-12 DIAGNOSIS — I5020 Unspecified systolic (congestive) heart failure: Secondary | ICD-10-CM | POA: Diagnosis not present

## 2019-01-12 DIAGNOSIS — Z741 Need for assistance with personal care: Secondary | ICD-10-CM | POA: Diagnosis not present

## 2019-01-12 DIAGNOSIS — M545 Low back pain: Secondary | ICD-10-CM | POA: Diagnosis not present

## 2019-01-13 DIAGNOSIS — I5020 Unspecified systolic (congestive) heart failure: Secondary | ICD-10-CM | POA: Diagnosis not present

## 2019-01-13 DIAGNOSIS — E559 Vitamin D deficiency, unspecified: Secondary | ICD-10-CM | POA: Diagnosis not present

## 2019-01-13 DIAGNOSIS — R278 Other lack of coordination: Secondary | ICD-10-CM | POA: Diagnosis not present

## 2019-01-13 DIAGNOSIS — M545 Low back pain: Secondary | ICD-10-CM | POA: Diagnosis not present

## 2019-01-13 DIAGNOSIS — R2689 Other abnormalities of gait and mobility: Secondary | ICD-10-CM | POA: Diagnosis not present

## 2019-01-13 DIAGNOSIS — M2681 Anterior soft tissue impingement: Secondary | ICD-10-CM | POA: Diagnosis not present

## 2019-01-13 DIAGNOSIS — Z741 Need for assistance with personal care: Secondary | ICD-10-CM | POA: Diagnosis not present

## 2019-01-14 DIAGNOSIS — R278 Other lack of coordination: Secondary | ICD-10-CM | POA: Diagnosis not present

## 2019-01-14 DIAGNOSIS — J449 Chronic obstructive pulmonary disease, unspecified: Secondary | ICD-10-CM | POA: Diagnosis not present

## 2019-01-14 DIAGNOSIS — M2681 Anterior soft tissue impingement: Secondary | ICD-10-CM | POA: Diagnosis not present

## 2019-01-14 DIAGNOSIS — Z741 Need for assistance with personal care: Secondary | ICD-10-CM | POA: Diagnosis not present

## 2019-01-14 DIAGNOSIS — R2689 Other abnormalities of gait and mobility: Secondary | ICD-10-CM | POA: Diagnosis not present

## 2019-01-14 DIAGNOSIS — I5020 Unspecified systolic (congestive) heart failure: Secondary | ICD-10-CM | POA: Diagnosis not present

## 2019-01-14 DIAGNOSIS — M545 Low back pain: Secondary | ICD-10-CM | POA: Diagnosis not present

## 2019-01-14 DIAGNOSIS — E559 Vitamin D deficiency, unspecified: Secondary | ICD-10-CM | POA: Diagnosis not present

## 2019-01-15 DIAGNOSIS — R278 Other lack of coordination: Secondary | ICD-10-CM | POA: Diagnosis not present

## 2019-01-15 DIAGNOSIS — M2681 Anterior soft tissue impingement: Secondary | ICD-10-CM | POA: Diagnosis not present

## 2019-01-15 DIAGNOSIS — I5020 Unspecified systolic (congestive) heart failure: Secondary | ICD-10-CM | POA: Diagnosis not present

## 2019-01-15 DIAGNOSIS — R2689 Other abnormalities of gait and mobility: Secondary | ICD-10-CM | POA: Diagnosis not present

## 2019-01-15 DIAGNOSIS — Z741 Need for assistance with personal care: Secondary | ICD-10-CM | POA: Diagnosis not present

## 2019-01-15 DIAGNOSIS — E559 Vitamin D deficiency, unspecified: Secondary | ICD-10-CM | POA: Diagnosis not present

## 2019-01-15 DIAGNOSIS — M545 Low back pain: Secondary | ICD-10-CM | POA: Diagnosis not present

## 2019-01-16 DIAGNOSIS — I5020 Unspecified systolic (congestive) heart failure: Secondary | ICD-10-CM | POA: Diagnosis not present

## 2019-01-16 DIAGNOSIS — R2689 Other abnormalities of gait and mobility: Secondary | ICD-10-CM | POA: Diagnosis not present

## 2019-01-16 DIAGNOSIS — R278 Other lack of coordination: Secondary | ICD-10-CM | POA: Diagnosis not present

## 2019-01-16 DIAGNOSIS — Z741 Need for assistance with personal care: Secondary | ICD-10-CM | POA: Diagnosis not present

## 2019-01-16 DIAGNOSIS — E559 Vitamin D deficiency, unspecified: Secondary | ICD-10-CM | POA: Diagnosis not present

## 2019-01-16 DIAGNOSIS — M2681 Anterior soft tissue impingement: Secondary | ICD-10-CM | POA: Diagnosis not present

## 2019-01-16 DIAGNOSIS — M545 Low back pain: Secondary | ICD-10-CM | POA: Diagnosis not present

## 2019-01-17 DIAGNOSIS — M2681 Anterior soft tissue impingement: Secondary | ICD-10-CM | POA: Diagnosis not present

## 2019-01-17 DIAGNOSIS — Z741 Need for assistance with personal care: Secondary | ICD-10-CM | POA: Diagnosis not present

## 2019-01-17 DIAGNOSIS — E559 Vitamin D deficiency, unspecified: Secondary | ICD-10-CM | POA: Diagnosis not present

## 2019-01-17 DIAGNOSIS — I5020 Unspecified systolic (congestive) heart failure: Secondary | ICD-10-CM | POA: Diagnosis not present

## 2019-01-17 DIAGNOSIS — R278 Other lack of coordination: Secondary | ICD-10-CM | POA: Diagnosis not present

## 2019-01-17 DIAGNOSIS — M545 Low back pain: Secondary | ICD-10-CM | POA: Diagnosis not present

## 2019-01-17 DIAGNOSIS — R2689 Other abnormalities of gait and mobility: Secondary | ICD-10-CM | POA: Diagnosis not present

## 2019-01-19 DIAGNOSIS — M2681 Anterior soft tissue impingement: Secondary | ICD-10-CM | POA: Diagnosis not present

## 2019-01-19 DIAGNOSIS — Z741 Need for assistance with personal care: Secondary | ICD-10-CM | POA: Diagnosis not present

## 2019-01-19 DIAGNOSIS — I5020 Unspecified systolic (congestive) heart failure: Secondary | ICD-10-CM | POA: Diagnosis not present

## 2019-01-19 DIAGNOSIS — R278 Other lack of coordination: Secondary | ICD-10-CM | POA: Diagnosis not present

## 2019-01-19 DIAGNOSIS — M545 Low back pain: Secondary | ICD-10-CM | POA: Diagnosis not present

## 2019-01-19 DIAGNOSIS — R2689 Other abnormalities of gait and mobility: Secondary | ICD-10-CM | POA: Diagnosis not present

## 2019-01-19 DIAGNOSIS — E559 Vitamin D deficiency, unspecified: Secondary | ICD-10-CM | POA: Diagnosis not present

## 2019-01-20 DIAGNOSIS — M2681 Anterior soft tissue impingement: Secondary | ICD-10-CM | POA: Diagnosis not present

## 2019-01-20 DIAGNOSIS — R2689 Other abnormalities of gait and mobility: Secondary | ICD-10-CM | POA: Diagnosis not present

## 2019-01-20 DIAGNOSIS — I5020 Unspecified systolic (congestive) heart failure: Secondary | ICD-10-CM | POA: Diagnosis not present

## 2019-01-20 DIAGNOSIS — Z741 Need for assistance with personal care: Secondary | ICD-10-CM | POA: Diagnosis not present

## 2019-01-20 DIAGNOSIS — R278 Other lack of coordination: Secondary | ICD-10-CM | POA: Diagnosis not present

## 2019-01-20 DIAGNOSIS — M545 Low back pain: Secondary | ICD-10-CM | POA: Diagnosis not present

## 2019-01-20 DIAGNOSIS — E559 Vitamin D deficiency, unspecified: Secondary | ICD-10-CM | POA: Diagnosis not present

## 2019-01-21 DIAGNOSIS — E559 Vitamin D deficiency, unspecified: Secondary | ICD-10-CM | POA: Diagnosis not present

## 2019-01-21 DIAGNOSIS — I5020 Unspecified systolic (congestive) heart failure: Secondary | ICD-10-CM | POA: Diagnosis not present

## 2019-01-21 DIAGNOSIS — M2681 Anterior soft tissue impingement: Secondary | ICD-10-CM | POA: Diagnosis not present

## 2019-01-21 DIAGNOSIS — R278 Other lack of coordination: Secondary | ICD-10-CM | POA: Diagnosis not present

## 2019-01-21 DIAGNOSIS — M545 Low back pain: Secondary | ICD-10-CM | POA: Diagnosis not present

## 2019-01-21 DIAGNOSIS — R2689 Other abnormalities of gait and mobility: Secondary | ICD-10-CM | POA: Diagnosis not present

## 2019-01-21 DIAGNOSIS — Z741 Need for assistance with personal care: Secondary | ICD-10-CM | POA: Diagnosis not present

## 2019-01-22 DIAGNOSIS — E559 Vitamin D deficiency, unspecified: Secondary | ICD-10-CM | POA: Diagnosis not present

## 2019-01-22 DIAGNOSIS — I5020 Unspecified systolic (congestive) heart failure: Secondary | ICD-10-CM | POA: Diagnosis not present

## 2019-01-22 DIAGNOSIS — M545 Low back pain: Secondary | ICD-10-CM | POA: Diagnosis not present

## 2019-01-22 DIAGNOSIS — R2689 Other abnormalities of gait and mobility: Secondary | ICD-10-CM | POA: Diagnosis not present

## 2019-01-22 DIAGNOSIS — R278 Other lack of coordination: Secondary | ICD-10-CM | POA: Diagnosis not present

## 2019-01-22 DIAGNOSIS — M2681 Anterior soft tissue impingement: Secondary | ICD-10-CM | POA: Diagnosis not present

## 2019-01-22 DIAGNOSIS — Z741 Need for assistance with personal care: Secondary | ICD-10-CM | POA: Diagnosis not present

## 2019-01-23 DIAGNOSIS — I5020 Unspecified systolic (congestive) heart failure: Secondary | ICD-10-CM | POA: Diagnosis not present

## 2019-01-23 DIAGNOSIS — M545 Low back pain: Secondary | ICD-10-CM | POA: Diagnosis not present

## 2019-01-23 DIAGNOSIS — R278 Other lack of coordination: Secondary | ICD-10-CM | POA: Diagnosis not present

## 2019-01-23 DIAGNOSIS — E559 Vitamin D deficiency, unspecified: Secondary | ICD-10-CM | POA: Diagnosis not present

## 2019-01-23 DIAGNOSIS — Z741 Need for assistance with personal care: Secondary | ICD-10-CM | POA: Diagnosis not present

## 2019-01-23 DIAGNOSIS — R2689 Other abnormalities of gait and mobility: Secondary | ICD-10-CM | POA: Diagnosis not present

## 2019-01-23 DIAGNOSIS — M2681 Anterior soft tissue impingement: Secondary | ICD-10-CM | POA: Diagnosis not present

## 2019-01-26 DIAGNOSIS — M545 Low back pain: Secondary | ICD-10-CM | POA: Diagnosis not present

## 2019-01-26 DIAGNOSIS — Z741 Need for assistance with personal care: Secondary | ICD-10-CM | POA: Diagnosis not present

## 2019-01-26 DIAGNOSIS — R278 Other lack of coordination: Secondary | ICD-10-CM | POA: Diagnosis not present

## 2019-01-26 DIAGNOSIS — R2689 Other abnormalities of gait and mobility: Secondary | ICD-10-CM | POA: Diagnosis not present

## 2019-01-26 DIAGNOSIS — I5020 Unspecified systolic (congestive) heart failure: Secondary | ICD-10-CM | POA: Diagnosis not present

## 2019-01-26 DIAGNOSIS — M2681 Anterior soft tissue impingement: Secondary | ICD-10-CM | POA: Diagnosis not present

## 2019-01-26 DIAGNOSIS — E559 Vitamin D deficiency, unspecified: Secondary | ICD-10-CM | POA: Diagnosis not present

## 2019-01-27 DIAGNOSIS — R278 Other lack of coordination: Secondary | ICD-10-CM | POA: Diagnosis not present

## 2019-01-27 DIAGNOSIS — I5020 Unspecified systolic (congestive) heart failure: Secondary | ICD-10-CM | POA: Diagnosis not present

## 2019-01-27 DIAGNOSIS — R2689 Other abnormalities of gait and mobility: Secondary | ICD-10-CM | POA: Diagnosis not present

## 2019-01-27 DIAGNOSIS — Z741 Need for assistance with personal care: Secondary | ICD-10-CM | POA: Diagnosis not present

## 2019-01-27 DIAGNOSIS — M2681 Anterior soft tissue impingement: Secondary | ICD-10-CM | POA: Diagnosis not present

## 2019-01-27 DIAGNOSIS — M545 Low back pain: Secondary | ICD-10-CM | POA: Diagnosis not present

## 2019-01-27 DIAGNOSIS — E559 Vitamin D deficiency, unspecified: Secondary | ICD-10-CM | POA: Diagnosis not present

## 2019-01-28 DIAGNOSIS — R278 Other lack of coordination: Secondary | ICD-10-CM | POA: Diagnosis not present

## 2019-01-28 DIAGNOSIS — I5020 Unspecified systolic (congestive) heart failure: Secondary | ICD-10-CM | POA: Diagnosis not present

## 2019-01-28 DIAGNOSIS — E559 Vitamin D deficiency, unspecified: Secondary | ICD-10-CM | POA: Diagnosis not present

## 2019-01-28 DIAGNOSIS — R2689 Other abnormalities of gait and mobility: Secondary | ICD-10-CM | POA: Diagnosis not present

## 2019-01-28 DIAGNOSIS — Z741 Need for assistance with personal care: Secondary | ICD-10-CM | POA: Diagnosis not present

## 2019-01-28 DIAGNOSIS — M545 Low back pain: Secondary | ICD-10-CM | POA: Diagnosis not present

## 2019-01-28 DIAGNOSIS — M2681 Anterior soft tissue impingement: Secondary | ICD-10-CM | POA: Diagnosis not present

## 2019-01-29 DIAGNOSIS — R2689 Other abnormalities of gait and mobility: Secondary | ICD-10-CM | POA: Diagnosis not present

## 2019-01-29 DIAGNOSIS — E559 Vitamin D deficiency, unspecified: Secondary | ICD-10-CM | POA: Diagnosis not present

## 2019-01-29 DIAGNOSIS — R278 Other lack of coordination: Secondary | ICD-10-CM | POA: Diagnosis not present

## 2019-01-29 DIAGNOSIS — Z741 Need for assistance with personal care: Secondary | ICD-10-CM | POA: Diagnosis not present

## 2019-01-29 DIAGNOSIS — M545 Low back pain: Secondary | ICD-10-CM | POA: Diagnosis not present

## 2019-01-29 DIAGNOSIS — M2681 Anterior soft tissue impingement: Secondary | ICD-10-CM | POA: Diagnosis not present

## 2019-01-29 DIAGNOSIS — I5020 Unspecified systolic (congestive) heart failure: Secondary | ICD-10-CM | POA: Diagnosis not present

## 2019-01-30 DIAGNOSIS — M2681 Anterior soft tissue impingement: Secondary | ICD-10-CM | POA: Diagnosis not present

## 2019-01-30 DIAGNOSIS — I5020 Unspecified systolic (congestive) heart failure: Secondary | ICD-10-CM | POA: Diagnosis not present

## 2019-01-30 DIAGNOSIS — R2689 Other abnormalities of gait and mobility: Secondary | ICD-10-CM | POA: Diagnosis not present

## 2019-01-30 DIAGNOSIS — Z741 Need for assistance with personal care: Secondary | ICD-10-CM | POA: Diagnosis not present

## 2019-01-30 DIAGNOSIS — E559 Vitamin D deficiency, unspecified: Secondary | ICD-10-CM | POA: Diagnosis not present

## 2019-01-30 DIAGNOSIS — R278 Other lack of coordination: Secondary | ICD-10-CM | POA: Diagnosis not present

## 2019-01-30 DIAGNOSIS — M545 Low back pain: Secondary | ICD-10-CM | POA: Diagnosis not present

## 2019-02-02 DIAGNOSIS — J449 Chronic obstructive pulmonary disease, unspecified: Secondary | ICD-10-CM | POA: Diagnosis not present

## 2019-02-02 DIAGNOSIS — R627 Adult failure to thrive: Secondary | ICD-10-CM | POA: Diagnosis not present

## 2019-02-12 DIAGNOSIS — J449 Chronic obstructive pulmonary disease, unspecified: Secondary | ICD-10-CM | POA: Diagnosis not present

## 2019-03-09 DIAGNOSIS — R627 Adult failure to thrive: Secondary | ICD-10-CM | POA: Diagnosis not present

## 2019-03-09 DIAGNOSIS — I5020 Unspecified systolic (congestive) heart failure: Secondary | ICD-10-CM | POA: Diagnosis not present

## 2019-03-13 DIAGNOSIS — M545 Low back pain: Secondary | ICD-10-CM | POA: Diagnosis not present

## 2019-03-13 DIAGNOSIS — R2689 Other abnormalities of gait and mobility: Secondary | ICD-10-CM | POA: Diagnosis not present

## 2019-03-13 DIAGNOSIS — R296 Repeated falls: Secondary | ICD-10-CM | POA: Diagnosis not present

## 2019-03-13 DIAGNOSIS — M6281 Muscle weakness (generalized): Secondary | ICD-10-CM | POA: Diagnosis not present

## 2019-03-13 DIAGNOSIS — I639 Cerebral infarction, unspecified: Secondary | ICD-10-CM | POA: Diagnosis not present

## 2019-03-13 DIAGNOSIS — I5020 Unspecified systolic (congestive) heart failure: Secondary | ICD-10-CM | POA: Diagnosis not present

## 2019-03-13 DIAGNOSIS — I10 Essential (primary) hypertension: Secondary | ICD-10-CM | POA: Diagnosis not present

## 2019-03-16 DIAGNOSIS — I10 Essential (primary) hypertension: Secondary | ICD-10-CM | POA: Diagnosis not present

## 2019-03-16 DIAGNOSIS — R2689 Other abnormalities of gait and mobility: Secondary | ICD-10-CM | POA: Diagnosis not present

## 2019-03-16 DIAGNOSIS — I5020 Unspecified systolic (congestive) heart failure: Secondary | ICD-10-CM | POA: Diagnosis not present

## 2019-03-16 DIAGNOSIS — I639 Cerebral infarction, unspecified: Secondary | ICD-10-CM | POA: Diagnosis not present

## 2019-03-16 DIAGNOSIS — J449 Chronic obstructive pulmonary disease, unspecified: Secondary | ICD-10-CM | POA: Diagnosis not present

## 2019-03-16 DIAGNOSIS — M545 Low back pain: Secondary | ICD-10-CM | POA: Diagnosis not present

## 2019-03-16 DIAGNOSIS — R296 Repeated falls: Secondary | ICD-10-CM | POA: Diagnosis not present

## 2019-03-16 DIAGNOSIS — M6281 Muscle weakness (generalized): Secondary | ICD-10-CM | POA: Diagnosis not present

## 2019-03-17 DIAGNOSIS — M545 Low back pain: Secondary | ICD-10-CM | POA: Diagnosis not present

## 2019-03-17 DIAGNOSIS — M6281 Muscle weakness (generalized): Secondary | ICD-10-CM | POA: Diagnosis not present

## 2019-03-17 DIAGNOSIS — R2689 Other abnormalities of gait and mobility: Secondary | ICD-10-CM | POA: Diagnosis not present

## 2019-03-17 DIAGNOSIS — I639 Cerebral infarction, unspecified: Secondary | ICD-10-CM | POA: Diagnosis not present

## 2019-03-17 DIAGNOSIS — R296 Repeated falls: Secondary | ICD-10-CM | POA: Diagnosis not present

## 2019-03-17 DIAGNOSIS — I10 Essential (primary) hypertension: Secondary | ICD-10-CM | POA: Diagnosis not present

## 2019-03-17 DIAGNOSIS — I5020 Unspecified systolic (congestive) heart failure: Secondary | ICD-10-CM | POA: Diagnosis not present

## 2019-03-18 DIAGNOSIS — M6281 Muscle weakness (generalized): Secondary | ICD-10-CM | POA: Diagnosis not present

## 2019-03-18 DIAGNOSIS — I5020 Unspecified systolic (congestive) heart failure: Secondary | ICD-10-CM | POA: Diagnosis not present

## 2019-03-18 DIAGNOSIS — R2689 Other abnormalities of gait and mobility: Secondary | ICD-10-CM | POA: Diagnosis not present

## 2019-03-18 DIAGNOSIS — R296 Repeated falls: Secondary | ICD-10-CM | POA: Diagnosis not present

## 2019-03-18 DIAGNOSIS — I10 Essential (primary) hypertension: Secondary | ICD-10-CM | POA: Diagnosis not present

## 2019-03-18 DIAGNOSIS — I639 Cerebral infarction, unspecified: Secondary | ICD-10-CM | POA: Diagnosis not present

## 2019-03-18 DIAGNOSIS — M545 Low back pain: Secondary | ICD-10-CM | POA: Diagnosis not present

## 2019-03-19 DIAGNOSIS — I10 Essential (primary) hypertension: Secondary | ICD-10-CM | POA: Diagnosis not present

## 2019-03-19 DIAGNOSIS — I5020 Unspecified systolic (congestive) heart failure: Secondary | ICD-10-CM | POA: Diagnosis not present

## 2019-03-19 DIAGNOSIS — R296 Repeated falls: Secondary | ICD-10-CM | POA: Diagnosis not present

## 2019-03-19 DIAGNOSIS — I639 Cerebral infarction, unspecified: Secondary | ICD-10-CM | POA: Diagnosis not present

## 2019-03-19 DIAGNOSIS — M6281 Muscle weakness (generalized): Secondary | ICD-10-CM | POA: Diagnosis not present

## 2019-03-19 DIAGNOSIS — R2689 Other abnormalities of gait and mobility: Secondary | ICD-10-CM | POA: Diagnosis not present

## 2019-03-19 DIAGNOSIS — M545 Low back pain: Secondary | ICD-10-CM | POA: Diagnosis not present

## 2019-03-20 DIAGNOSIS — I5020 Unspecified systolic (congestive) heart failure: Secondary | ICD-10-CM | POA: Diagnosis not present

## 2019-03-20 DIAGNOSIS — I639 Cerebral infarction, unspecified: Secondary | ICD-10-CM | POA: Diagnosis not present

## 2019-03-20 DIAGNOSIS — I10 Essential (primary) hypertension: Secondary | ICD-10-CM | POA: Diagnosis not present

## 2019-03-20 DIAGNOSIS — R2689 Other abnormalities of gait and mobility: Secondary | ICD-10-CM | POA: Diagnosis not present

## 2019-03-20 DIAGNOSIS — R296 Repeated falls: Secondary | ICD-10-CM | POA: Diagnosis not present

## 2019-03-20 DIAGNOSIS — M545 Low back pain: Secondary | ICD-10-CM | POA: Diagnosis not present

## 2019-03-20 DIAGNOSIS — M6281 Muscle weakness (generalized): Secondary | ICD-10-CM | POA: Diagnosis not present

## 2019-03-23 DIAGNOSIS — I10 Essential (primary) hypertension: Secondary | ICD-10-CM | POA: Diagnosis not present

## 2019-03-23 DIAGNOSIS — M6281 Muscle weakness (generalized): Secondary | ICD-10-CM | POA: Diagnosis not present

## 2019-03-23 DIAGNOSIS — R296 Repeated falls: Secondary | ICD-10-CM | POA: Diagnosis not present

## 2019-03-23 DIAGNOSIS — M545 Low back pain: Secondary | ICD-10-CM | POA: Diagnosis not present

## 2019-03-23 DIAGNOSIS — R2689 Other abnormalities of gait and mobility: Secondary | ICD-10-CM | POA: Diagnosis not present

## 2019-03-23 DIAGNOSIS — I5020 Unspecified systolic (congestive) heart failure: Secondary | ICD-10-CM | POA: Diagnosis not present

## 2019-03-23 DIAGNOSIS — I639 Cerebral infarction, unspecified: Secondary | ICD-10-CM | POA: Diagnosis not present

## 2019-03-24 DIAGNOSIS — M545 Low back pain: Secondary | ICD-10-CM | POA: Diagnosis not present

## 2019-03-24 DIAGNOSIS — M6281 Muscle weakness (generalized): Secondary | ICD-10-CM | POA: Diagnosis not present

## 2019-03-24 DIAGNOSIS — R2689 Other abnormalities of gait and mobility: Secondary | ICD-10-CM | POA: Diagnosis not present

## 2019-03-24 DIAGNOSIS — I10 Essential (primary) hypertension: Secondary | ICD-10-CM | POA: Diagnosis not present

## 2019-03-24 DIAGNOSIS — R296 Repeated falls: Secondary | ICD-10-CM | POA: Diagnosis not present

## 2019-03-24 DIAGNOSIS — I5020 Unspecified systolic (congestive) heart failure: Secondary | ICD-10-CM | POA: Diagnosis not present

## 2019-03-24 DIAGNOSIS — I639 Cerebral infarction, unspecified: Secondary | ICD-10-CM | POA: Diagnosis not present

## 2019-03-25 DIAGNOSIS — I639 Cerebral infarction, unspecified: Secondary | ICD-10-CM | POA: Diagnosis not present

## 2019-03-25 DIAGNOSIS — R2689 Other abnormalities of gait and mobility: Secondary | ICD-10-CM | POA: Diagnosis not present

## 2019-03-25 DIAGNOSIS — I10 Essential (primary) hypertension: Secondary | ICD-10-CM | POA: Diagnosis not present

## 2019-03-25 DIAGNOSIS — I5020 Unspecified systolic (congestive) heart failure: Secondary | ICD-10-CM | POA: Diagnosis not present

## 2019-03-25 DIAGNOSIS — M545 Low back pain: Secondary | ICD-10-CM | POA: Diagnosis not present

## 2019-03-25 DIAGNOSIS — R296 Repeated falls: Secondary | ICD-10-CM | POA: Diagnosis not present

## 2019-03-25 DIAGNOSIS — M6281 Muscle weakness (generalized): Secondary | ICD-10-CM | POA: Diagnosis not present

## 2019-03-26 DIAGNOSIS — R2689 Other abnormalities of gait and mobility: Secondary | ICD-10-CM | POA: Diagnosis not present

## 2019-03-26 DIAGNOSIS — M545 Low back pain: Secondary | ICD-10-CM | POA: Diagnosis not present

## 2019-03-26 DIAGNOSIS — I5020 Unspecified systolic (congestive) heart failure: Secondary | ICD-10-CM | POA: Diagnosis not present

## 2019-03-26 DIAGNOSIS — R296 Repeated falls: Secondary | ICD-10-CM | POA: Diagnosis not present

## 2019-03-26 DIAGNOSIS — I639 Cerebral infarction, unspecified: Secondary | ICD-10-CM | POA: Diagnosis not present

## 2019-03-26 DIAGNOSIS — M6281 Muscle weakness (generalized): Secondary | ICD-10-CM | POA: Diagnosis not present

## 2019-03-26 DIAGNOSIS — I10 Essential (primary) hypertension: Secondary | ICD-10-CM | POA: Diagnosis not present

## 2019-03-27 DIAGNOSIS — M6281 Muscle weakness (generalized): Secondary | ICD-10-CM | POA: Diagnosis not present

## 2019-03-27 DIAGNOSIS — I639 Cerebral infarction, unspecified: Secondary | ICD-10-CM | POA: Diagnosis not present

## 2019-03-27 DIAGNOSIS — R2689 Other abnormalities of gait and mobility: Secondary | ICD-10-CM | POA: Diagnosis not present

## 2019-03-27 DIAGNOSIS — I5020 Unspecified systolic (congestive) heart failure: Secondary | ICD-10-CM | POA: Diagnosis not present

## 2019-03-27 DIAGNOSIS — R296 Repeated falls: Secondary | ICD-10-CM | POA: Diagnosis not present

## 2019-03-27 DIAGNOSIS — M545 Low back pain: Secondary | ICD-10-CM | POA: Diagnosis not present

## 2019-03-27 DIAGNOSIS — I10 Essential (primary) hypertension: Secondary | ICD-10-CM | POA: Diagnosis not present

## 2019-03-30 DIAGNOSIS — I639 Cerebral infarction, unspecified: Secondary | ICD-10-CM | POA: Diagnosis not present

## 2019-03-30 DIAGNOSIS — M545 Low back pain: Secondary | ICD-10-CM | POA: Diagnosis not present

## 2019-03-30 DIAGNOSIS — R296 Repeated falls: Secondary | ICD-10-CM | POA: Diagnosis not present

## 2019-03-30 DIAGNOSIS — I5020 Unspecified systolic (congestive) heart failure: Secondary | ICD-10-CM | POA: Diagnosis not present

## 2019-03-30 DIAGNOSIS — R2689 Other abnormalities of gait and mobility: Secondary | ICD-10-CM | POA: Diagnosis not present

## 2019-03-30 DIAGNOSIS — M6281 Muscle weakness (generalized): Secondary | ICD-10-CM | POA: Diagnosis not present

## 2019-03-30 DIAGNOSIS — I10 Essential (primary) hypertension: Secondary | ICD-10-CM | POA: Diagnosis not present

## 2019-03-31 DIAGNOSIS — I5020 Unspecified systolic (congestive) heart failure: Secondary | ICD-10-CM | POA: Diagnosis not present

## 2019-03-31 DIAGNOSIS — I10 Essential (primary) hypertension: Secondary | ICD-10-CM | POA: Diagnosis not present

## 2019-03-31 DIAGNOSIS — M6281 Muscle weakness (generalized): Secondary | ICD-10-CM | POA: Diagnosis not present

## 2019-03-31 DIAGNOSIS — R2689 Other abnormalities of gait and mobility: Secondary | ICD-10-CM | POA: Diagnosis not present

## 2019-03-31 DIAGNOSIS — R296 Repeated falls: Secondary | ICD-10-CM | POA: Diagnosis not present

## 2019-03-31 DIAGNOSIS — M545 Low back pain: Secondary | ICD-10-CM | POA: Diagnosis not present

## 2019-03-31 DIAGNOSIS — I639 Cerebral infarction, unspecified: Secondary | ICD-10-CM | POA: Diagnosis not present

## 2019-04-01 DIAGNOSIS — M6281 Muscle weakness (generalized): Secondary | ICD-10-CM | POA: Diagnosis not present

## 2019-04-01 DIAGNOSIS — I5020 Unspecified systolic (congestive) heart failure: Secondary | ICD-10-CM | POA: Diagnosis not present

## 2019-04-01 DIAGNOSIS — R2689 Other abnormalities of gait and mobility: Secondary | ICD-10-CM | POA: Diagnosis not present

## 2019-04-01 DIAGNOSIS — M545 Low back pain: Secondary | ICD-10-CM | POA: Diagnosis not present

## 2019-04-01 DIAGNOSIS — I10 Essential (primary) hypertension: Secondary | ICD-10-CM | POA: Diagnosis not present

## 2019-04-01 DIAGNOSIS — I639 Cerebral infarction, unspecified: Secondary | ICD-10-CM | POA: Diagnosis not present

## 2019-04-01 DIAGNOSIS — R296 Repeated falls: Secondary | ICD-10-CM | POA: Diagnosis not present

## 2019-04-02 DIAGNOSIS — I639 Cerebral infarction, unspecified: Secondary | ICD-10-CM | POA: Diagnosis not present

## 2019-04-02 DIAGNOSIS — M6281 Muscle weakness (generalized): Secondary | ICD-10-CM | POA: Diagnosis not present

## 2019-04-02 DIAGNOSIS — R296 Repeated falls: Secondary | ICD-10-CM | POA: Diagnosis not present

## 2019-04-02 DIAGNOSIS — M545 Low back pain: Secondary | ICD-10-CM | POA: Diagnosis not present

## 2019-04-02 DIAGNOSIS — I5020 Unspecified systolic (congestive) heart failure: Secondary | ICD-10-CM | POA: Diagnosis not present

## 2019-04-02 DIAGNOSIS — R2689 Other abnormalities of gait and mobility: Secondary | ICD-10-CM | POA: Diagnosis not present

## 2019-04-02 DIAGNOSIS — I10 Essential (primary) hypertension: Secondary | ICD-10-CM | POA: Diagnosis not present

## 2019-04-03 DIAGNOSIS — M545 Low back pain: Secondary | ICD-10-CM | POA: Diagnosis not present

## 2019-04-03 DIAGNOSIS — R296 Repeated falls: Secondary | ICD-10-CM | POA: Diagnosis not present

## 2019-04-03 DIAGNOSIS — I10 Essential (primary) hypertension: Secondary | ICD-10-CM | POA: Diagnosis not present

## 2019-04-03 DIAGNOSIS — R2689 Other abnormalities of gait and mobility: Secondary | ICD-10-CM | POA: Diagnosis not present

## 2019-04-03 DIAGNOSIS — I639 Cerebral infarction, unspecified: Secondary | ICD-10-CM | POA: Diagnosis not present

## 2019-04-03 DIAGNOSIS — I5020 Unspecified systolic (congestive) heart failure: Secondary | ICD-10-CM | POA: Diagnosis not present

## 2019-04-03 DIAGNOSIS — M6281 Muscle weakness (generalized): Secondary | ICD-10-CM | POA: Diagnosis not present

## 2019-04-06 DIAGNOSIS — I639 Cerebral infarction, unspecified: Secondary | ICD-10-CM | POA: Diagnosis not present

## 2019-04-06 DIAGNOSIS — M6281 Muscle weakness (generalized): Secondary | ICD-10-CM | POA: Diagnosis not present

## 2019-04-06 DIAGNOSIS — I10 Essential (primary) hypertension: Secondary | ICD-10-CM | POA: Diagnosis not present

## 2019-04-06 DIAGNOSIS — M545 Low back pain: Secondary | ICD-10-CM | POA: Diagnosis not present

## 2019-04-06 DIAGNOSIS — I5020 Unspecified systolic (congestive) heart failure: Secondary | ICD-10-CM | POA: Diagnosis not present

## 2019-04-06 DIAGNOSIS — R2689 Other abnormalities of gait and mobility: Secondary | ICD-10-CM | POA: Diagnosis not present

## 2019-04-06 DIAGNOSIS — R296 Repeated falls: Secondary | ICD-10-CM | POA: Diagnosis not present

## 2019-04-07 DIAGNOSIS — M545 Low back pain: Secondary | ICD-10-CM | POA: Diagnosis not present

## 2019-04-07 DIAGNOSIS — I10 Essential (primary) hypertension: Secondary | ICD-10-CM | POA: Diagnosis not present

## 2019-04-07 DIAGNOSIS — M6281 Muscle weakness (generalized): Secondary | ICD-10-CM | POA: Diagnosis not present

## 2019-04-07 DIAGNOSIS — R2689 Other abnormalities of gait and mobility: Secondary | ICD-10-CM | POA: Diagnosis not present

## 2019-04-07 DIAGNOSIS — I639 Cerebral infarction, unspecified: Secondary | ICD-10-CM | POA: Diagnosis not present

## 2019-04-07 DIAGNOSIS — I5020 Unspecified systolic (congestive) heart failure: Secondary | ICD-10-CM | POA: Diagnosis not present

## 2019-04-07 DIAGNOSIS — R296 Repeated falls: Secondary | ICD-10-CM | POA: Diagnosis not present

## 2019-04-08 DIAGNOSIS — R296 Repeated falls: Secondary | ICD-10-CM | POA: Diagnosis not present

## 2019-04-08 DIAGNOSIS — R2689 Other abnormalities of gait and mobility: Secondary | ICD-10-CM | POA: Diagnosis not present

## 2019-04-08 DIAGNOSIS — I10 Essential (primary) hypertension: Secondary | ICD-10-CM | POA: Diagnosis not present

## 2019-04-08 DIAGNOSIS — I639 Cerebral infarction, unspecified: Secondary | ICD-10-CM | POA: Diagnosis not present

## 2019-04-08 DIAGNOSIS — M6281 Muscle weakness (generalized): Secondary | ICD-10-CM | POA: Diagnosis not present

## 2019-04-08 DIAGNOSIS — M545 Low back pain: Secondary | ICD-10-CM | POA: Diagnosis not present

## 2019-04-08 DIAGNOSIS — I5020 Unspecified systolic (congestive) heart failure: Secondary | ICD-10-CM | POA: Diagnosis not present

## 2019-04-09 DIAGNOSIS — R2689 Other abnormalities of gait and mobility: Secondary | ICD-10-CM | POA: Diagnosis not present

## 2019-04-09 DIAGNOSIS — I5020 Unspecified systolic (congestive) heart failure: Secondary | ICD-10-CM | POA: Diagnosis not present

## 2019-04-09 DIAGNOSIS — I10 Essential (primary) hypertension: Secondary | ICD-10-CM | POA: Diagnosis not present

## 2019-04-09 DIAGNOSIS — M545 Low back pain: Secondary | ICD-10-CM | POA: Diagnosis not present

## 2019-04-09 DIAGNOSIS — R296 Repeated falls: Secondary | ICD-10-CM | POA: Diagnosis not present

## 2019-04-09 DIAGNOSIS — I639 Cerebral infarction, unspecified: Secondary | ICD-10-CM | POA: Diagnosis not present

## 2019-04-09 DIAGNOSIS — M6281 Muscle weakness (generalized): Secondary | ICD-10-CM | POA: Diagnosis not present

## 2019-04-10 DIAGNOSIS — R2689 Other abnormalities of gait and mobility: Secondary | ICD-10-CM | POA: Diagnosis not present

## 2019-04-10 DIAGNOSIS — I639 Cerebral infarction, unspecified: Secondary | ICD-10-CM | POA: Diagnosis not present

## 2019-04-10 DIAGNOSIS — M545 Low back pain: Secondary | ICD-10-CM | POA: Diagnosis not present

## 2019-04-10 DIAGNOSIS — I10 Essential (primary) hypertension: Secondary | ICD-10-CM | POA: Diagnosis not present

## 2019-04-10 DIAGNOSIS — R296 Repeated falls: Secondary | ICD-10-CM | POA: Diagnosis not present

## 2019-04-10 DIAGNOSIS — M6281 Muscle weakness (generalized): Secondary | ICD-10-CM | POA: Diagnosis not present

## 2019-04-10 DIAGNOSIS — I5020 Unspecified systolic (congestive) heart failure: Secondary | ICD-10-CM | POA: Diagnosis not present

## 2019-04-13 DIAGNOSIS — R2689 Other abnormalities of gait and mobility: Secondary | ICD-10-CM | POA: Diagnosis not present

## 2019-04-13 DIAGNOSIS — I639 Cerebral infarction, unspecified: Secondary | ICD-10-CM | POA: Diagnosis not present

## 2019-04-13 DIAGNOSIS — I5020 Unspecified systolic (congestive) heart failure: Secondary | ICD-10-CM | POA: Diagnosis not present

## 2019-04-13 DIAGNOSIS — M6281 Muscle weakness (generalized): Secondary | ICD-10-CM | POA: Diagnosis not present

## 2019-04-13 DIAGNOSIS — R269 Unspecified abnormalities of gait and mobility: Secondary | ICD-10-CM | POA: Diagnosis not present

## 2019-04-14 DIAGNOSIS — M6281 Muscle weakness (generalized): Secondary | ICD-10-CM | POA: Diagnosis not present

## 2019-04-14 DIAGNOSIS — I5020 Unspecified systolic (congestive) heart failure: Secondary | ICD-10-CM | POA: Diagnosis not present

## 2019-04-14 DIAGNOSIS — R2689 Other abnormalities of gait and mobility: Secondary | ICD-10-CM | POA: Diagnosis not present

## 2019-04-14 DIAGNOSIS — I639 Cerebral infarction, unspecified: Secondary | ICD-10-CM | POA: Diagnosis not present

## 2019-04-14 DIAGNOSIS — R269 Unspecified abnormalities of gait and mobility: Secondary | ICD-10-CM | POA: Diagnosis not present

## 2019-04-15 DIAGNOSIS — R2689 Other abnormalities of gait and mobility: Secondary | ICD-10-CM | POA: Diagnosis not present

## 2019-04-15 DIAGNOSIS — R269 Unspecified abnormalities of gait and mobility: Secondary | ICD-10-CM | POA: Diagnosis not present

## 2019-04-15 DIAGNOSIS — I5020 Unspecified systolic (congestive) heart failure: Secondary | ICD-10-CM | POA: Diagnosis not present

## 2019-04-15 DIAGNOSIS — I639 Cerebral infarction, unspecified: Secondary | ICD-10-CM | POA: Diagnosis not present

## 2019-04-15 DIAGNOSIS — M6281 Muscle weakness (generalized): Secondary | ICD-10-CM | POA: Diagnosis not present

## 2019-04-16 DIAGNOSIS — I5020 Unspecified systolic (congestive) heart failure: Secondary | ICD-10-CM | POA: Diagnosis not present

## 2019-04-16 DIAGNOSIS — M6281 Muscle weakness (generalized): Secondary | ICD-10-CM | POA: Diagnosis not present

## 2019-04-16 DIAGNOSIS — R2689 Other abnormalities of gait and mobility: Secondary | ICD-10-CM | POA: Diagnosis not present

## 2019-04-16 DIAGNOSIS — I639 Cerebral infarction, unspecified: Secondary | ICD-10-CM | POA: Diagnosis not present

## 2019-04-16 DIAGNOSIS — R269 Unspecified abnormalities of gait and mobility: Secondary | ICD-10-CM | POA: Diagnosis not present

## 2019-04-17 DIAGNOSIS — M545 Low back pain: Secondary | ICD-10-CM | POA: Diagnosis not present

## 2019-04-17 DIAGNOSIS — Z79899 Other long term (current) drug therapy: Secondary | ICD-10-CM | POA: Diagnosis not present

## 2019-04-20 DIAGNOSIS — I509 Heart failure, unspecified: Secondary | ICD-10-CM | POA: Diagnosis not present

## 2019-04-20 DIAGNOSIS — R627 Adult failure to thrive: Secondary | ICD-10-CM | POA: Diagnosis not present

## 2019-04-20 DIAGNOSIS — I5020 Unspecified systolic (congestive) heart failure: Secondary | ICD-10-CM | POA: Diagnosis not present

## 2019-04-20 DIAGNOSIS — G309 Alzheimer's disease, unspecified: Secondary | ICD-10-CM | POA: Diagnosis not present

## 2019-04-23 DIAGNOSIS — R2689 Other abnormalities of gait and mobility: Secondary | ICD-10-CM | POA: Diagnosis not present

## 2019-04-30 DIAGNOSIS — M545 Low back pain: Secondary | ICD-10-CM | POA: Diagnosis not present

## 2019-05-04 DIAGNOSIS — R400 Somnolence: Secondary | ICD-10-CM | POA: Diagnosis not present

## 2019-05-04 DIAGNOSIS — Z79899 Other long term (current) drug therapy: Secondary | ICD-10-CM | POA: Diagnosis not present

## 2019-05-04 DIAGNOSIS — R062 Wheezing: Secondary | ICD-10-CM | POA: Diagnosis not present

## 2019-05-04 DIAGNOSIS — R05 Cough: Secondary | ICD-10-CM | POA: Diagnosis not present

## 2019-05-04 DIAGNOSIS — R0602 Shortness of breath: Secondary | ICD-10-CM | POA: Diagnosis not present

## 2019-05-14 DIAGNOSIS — Z9181 History of falling: Secondary | ICD-10-CM | POA: Diagnosis not present

## 2019-05-14 DIAGNOSIS — R410 Disorientation, unspecified: Secondary | ICD-10-CM | POA: Diagnosis not present

## 2019-05-18 DIAGNOSIS — I10 Essential (primary) hypertension: Secondary | ICD-10-CM | POA: Diagnosis not present

## 2019-05-18 DIAGNOSIS — M48061 Spinal stenosis, lumbar region without neurogenic claudication: Secondary | ICD-10-CM | POA: Diagnosis not present

## 2019-05-18 DIAGNOSIS — M6281 Muscle weakness (generalized): Secondary | ICD-10-CM | POA: Diagnosis not present

## 2019-05-18 DIAGNOSIS — K219 Gastro-esophageal reflux disease without esophagitis: Secondary | ICD-10-CM | POA: Diagnosis not present

## 2019-05-18 DIAGNOSIS — R1312 Dysphagia, oropharyngeal phase: Secondary | ICD-10-CM | POA: Diagnosis not present

## 2019-05-18 DIAGNOSIS — R278 Other lack of coordination: Secondary | ICD-10-CM | POA: Diagnosis not present

## 2019-05-18 DIAGNOSIS — I5020 Unspecified systolic (congestive) heart failure: Secondary | ICD-10-CM | POA: Diagnosis not present

## 2019-05-18 DIAGNOSIS — I639 Cerebral infarction, unspecified: Secondary | ICD-10-CM | POA: Diagnosis not present

## 2019-05-19 DIAGNOSIS — I639 Cerebral infarction, unspecified: Secondary | ICD-10-CM | POA: Diagnosis not present

## 2019-05-19 DIAGNOSIS — M48061 Spinal stenosis, lumbar region without neurogenic claudication: Secondary | ICD-10-CM | POA: Diagnosis not present

## 2019-05-19 DIAGNOSIS — R278 Other lack of coordination: Secondary | ICD-10-CM | POA: Diagnosis not present

## 2019-05-19 DIAGNOSIS — K219 Gastro-esophageal reflux disease without esophagitis: Secondary | ICD-10-CM | POA: Diagnosis not present

## 2019-05-19 DIAGNOSIS — M6281 Muscle weakness (generalized): Secondary | ICD-10-CM | POA: Diagnosis not present

## 2019-05-19 DIAGNOSIS — R1312 Dysphagia, oropharyngeal phase: Secondary | ICD-10-CM | POA: Diagnosis not present

## 2019-05-19 DIAGNOSIS — I5020 Unspecified systolic (congestive) heart failure: Secondary | ICD-10-CM | POA: Diagnosis not present

## 2019-05-19 DIAGNOSIS — I10 Essential (primary) hypertension: Secondary | ICD-10-CM | POA: Diagnosis not present

## 2019-05-20 DIAGNOSIS — I639 Cerebral infarction, unspecified: Secondary | ICD-10-CM | POA: Diagnosis not present

## 2019-05-20 DIAGNOSIS — R1312 Dysphagia, oropharyngeal phase: Secondary | ICD-10-CM | POA: Diagnosis not present

## 2019-05-20 DIAGNOSIS — K219 Gastro-esophageal reflux disease without esophagitis: Secondary | ICD-10-CM | POA: Diagnosis not present

## 2019-05-20 DIAGNOSIS — I5020 Unspecified systolic (congestive) heart failure: Secondary | ICD-10-CM | POA: Diagnosis not present

## 2019-05-20 DIAGNOSIS — I10 Essential (primary) hypertension: Secondary | ICD-10-CM | POA: Diagnosis not present

## 2019-05-20 DIAGNOSIS — R278 Other lack of coordination: Secondary | ICD-10-CM | POA: Diagnosis not present

## 2019-05-20 DIAGNOSIS — M48061 Spinal stenosis, lumbar region without neurogenic claudication: Secondary | ICD-10-CM | POA: Diagnosis not present

## 2019-05-20 DIAGNOSIS — M6281 Muscle weakness (generalized): Secondary | ICD-10-CM | POA: Diagnosis not present

## 2019-05-21 DIAGNOSIS — K219 Gastro-esophageal reflux disease without esophagitis: Secondary | ICD-10-CM | POA: Diagnosis not present

## 2019-05-21 DIAGNOSIS — M6281 Muscle weakness (generalized): Secondary | ICD-10-CM | POA: Diagnosis not present

## 2019-05-21 DIAGNOSIS — I5020 Unspecified systolic (congestive) heart failure: Secondary | ICD-10-CM | POA: Diagnosis not present

## 2019-05-21 DIAGNOSIS — R1312 Dysphagia, oropharyngeal phase: Secondary | ICD-10-CM | POA: Diagnosis not present

## 2019-05-21 DIAGNOSIS — I639 Cerebral infarction, unspecified: Secondary | ICD-10-CM | POA: Diagnosis not present

## 2019-05-21 DIAGNOSIS — I10 Essential (primary) hypertension: Secondary | ICD-10-CM | POA: Diagnosis not present

## 2019-05-21 DIAGNOSIS — M48061 Spinal stenosis, lumbar region without neurogenic claudication: Secondary | ICD-10-CM | POA: Diagnosis not present

## 2019-05-21 DIAGNOSIS — R278 Other lack of coordination: Secondary | ICD-10-CM | POA: Diagnosis not present

## 2019-05-22 DIAGNOSIS — I639 Cerebral infarction, unspecified: Secondary | ICD-10-CM | POA: Diagnosis not present

## 2019-05-22 DIAGNOSIS — K219 Gastro-esophageal reflux disease without esophagitis: Secondary | ICD-10-CM | POA: Diagnosis not present

## 2019-05-22 DIAGNOSIS — I5020 Unspecified systolic (congestive) heart failure: Secondary | ICD-10-CM | POA: Diagnosis not present

## 2019-05-22 DIAGNOSIS — M48061 Spinal stenosis, lumbar region without neurogenic claudication: Secondary | ICD-10-CM | POA: Diagnosis not present

## 2019-05-22 DIAGNOSIS — M6281 Muscle weakness (generalized): Secondary | ICD-10-CM | POA: Diagnosis not present

## 2019-05-22 DIAGNOSIS — R1312 Dysphagia, oropharyngeal phase: Secondary | ICD-10-CM | POA: Diagnosis not present

## 2019-05-22 DIAGNOSIS — R278 Other lack of coordination: Secondary | ICD-10-CM | POA: Diagnosis not present

## 2019-05-22 DIAGNOSIS — I10 Essential (primary) hypertension: Secondary | ICD-10-CM | POA: Diagnosis not present

## 2019-05-25 DIAGNOSIS — I639 Cerebral infarction, unspecified: Secondary | ICD-10-CM | POA: Diagnosis not present

## 2019-05-25 DIAGNOSIS — I5020 Unspecified systolic (congestive) heart failure: Secondary | ICD-10-CM | POA: Diagnosis not present

## 2019-05-25 DIAGNOSIS — K219 Gastro-esophageal reflux disease without esophagitis: Secondary | ICD-10-CM | POA: Diagnosis not present

## 2019-05-25 DIAGNOSIS — M48061 Spinal stenosis, lumbar region without neurogenic claudication: Secondary | ICD-10-CM | POA: Diagnosis not present

## 2019-05-25 DIAGNOSIS — M6281 Muscle weakness (generalized): Secondary | ICD-10-CM | POA: Diagnosis not present

## 2019-05-25 DIAGNOSIS — R278 Other lack of coordination: Secondary | ICD-10-CM | POA: Diagnosis not present

## 2019-05-25 DIAGNOSIS — R1312 Dysphagia, oropharyngeal phase: Secondary | ICD-10-CM | POA: Diagnosis not present

## 2019-05-25 DIAGNOSIS — I10 Essential (primary) hypertension: Secondary | ICD-10-CM | POA: Diagnosis not present

## 2019-05-26 DIAGNOSIS — I639 Cerebral infarction, unspecified: Secondary | ICD-10-CM | POA: Diagnosis not present

## 2019-05-26 DIAGNOSIS — M48061 Spinal stenosis, lumbar region without neurogenic claudication: Secondary | ICD-10-CM | POA: Diagnosis not present

## 2019-05-26 DIAGNOSIS — R278 Other lack of coordination: Secondary | ICD-10-CM | POA: Diagnosis not present

## 2019-05-26 DIAGNOSIS — K219 Gastro-esophageal reflux disease without esophagitis: Secondary | ICD-10-CM | POA: Diagnosis not present

## 2019-05-26 DIAGNOSIS — I5020 Unspecified systolic (congestive) heart failure: Secondary | ICD-10-CM | POA: Diagnosis not present

## 2019-05-26 DIAGNOSIS — I10 Essential (primary) hypertension: Secondary | ICD-10-CM | POA: Diagnosis not present

## 2019-05-26 DIAGNOSIS — M6281 Muscle weakness (generalized): Secondary | ICD-10-CM | POA: Diagnosis not present

## 2019-05-26 DIAGNOSIS — R1312 Dysphagia, oropharyngeal phase: Secondary | ICD-10-CM | POA: Diagnosis not present

## 2019-05-27 DIAGNOSIS — I5020 Unspecified systolic (congestive) heart failure: Secondary | ICD-10-CM | POA: Diagnosis not present

## 2019-05-27 DIAGNOSIS — Z79899 Other long term (current) drug therapy: Secondary | ICD-10-CM | POA: Diagnosis not present

## 2019-05-27 DIAGNOSIS — R1312 Dysphagia, oropharyngeal phase: Secondary | ICD-10-CM | POA: Diagnosis not present

## 2019-05-27 DIAGNOSIS — R278 Other lack of coordination: Secondary | ICD-10-CM | POA: Diagnosis not present

## 2019-05-27 DIAGNOSIS — I639 Cerebral infarction, unspecified: Secondary | ICD-10-CM | POA: Diagnosis not present

## 2019-05-27 DIAGNOSIS — K219 Gastro-esophageal reflux disease without esophagitis: Secondary | ICD-10-CM | POA: Diagnosis not present

## 2019-05-27 DIAGNOSIS — M48061 Spinal stenosis, lumbar region without neurogenic claudication: Secondary | ICD-10-CM | POA: Diagnosis not present

## 2019-05-27 DIAGNOSIS — I10 Essential (primary) hypertension: Secondary | ICD-10-CM | POA: Diagnosis not present

## 2019-05-27 DIAGNOSIS — M6281 Muscle weakness (generalized): Secondary | ICD-10-CM | POA: Diagnosis not present

## 2019-05-28 DIAGNOSIS — R278 Other lack of coordination: Secondary | ICD-10-CM | POA: Diagnosis not present

## 2019-05-28 DIAGNOSIS — I10 Essential (primary) hypertension: Secondary | ICD-10-CM | POA: Diagnosis not present

## 2019-05-28 DIAGNOSIS — M48061 Spinal stenosis, lumbar region without neurogenic claudication: Secondary | ICD-10-CM | POA: Diagnosis not present

## 2019-05-28 DIAGNOSIS — R627 Adult failure to thrive: Secondary | ICD-10-CM | POA: Diagnosis not present

## 2019-05-28 DIAGNOSIS — M6281 Muscle weakness (generalized): Secondary | ICD-10-CM | POA: Diagnosis not present

## 2019-05-28 DIAGNOSIS — K219 Gastro-esophageal reflux disease without esophagitis: Secondary | ICD-10-CM | POA: Diagnosis not present

## 2019-05-28 DIAGNOSIS — I5020 Unspecified systolic (congestive) heart failure: Secondary | ICD-10-CM | POA: Diagnosis not present

## 2019-05-28 DIAGNOSIS — R1312 Dysphagia, oropharyngeal phase: Secondary | ICD-10-CM | POA: Diagnosis not present

## 2019-05-28 DIAGNOSIS — I639 Cerebral infarction, unspecified: Secondary | ICD-10-CM | POA: Diagnosis not present

## 2019-05-29 DIAGNOSIS — R1312 Dysphagia, oropharyngeal phase: Secondary | ICD-10-CM | POA: Diagnosis not present

## 2019-05-29 DIAGNOSIS — M48061 Spinal stenosis, lumbar region without neurogenic claudication: Secondary | ICD-10-CM | POA: Diagnosis not present

## 2019-05-29 DIAGNOSIS — I5020 Unspecified systolic (congestive) heart failure: Secondary | ICD-10-CM | POA: Diagnosis not present

## 2019-05-29 DIAGNOSIS — I10 Essential (primary) hypertension: Secondary | ICD-10-CM | POA: Diagnosis not present

## 2019-05-29 DIAGNOSIS — I639 Cerebral infarction, unspecified: Secondary | ICD-10-CM | POA: Diagnosis not present

## 2019-05-29 DIAGNOSIS — R278 Other lack of coordination: Secondary | ICD-10-CM | POA: Diagnosis not present

## 2019-05-29 DIAGNOSIS — K219 Gastro-esophageal reflux disease without esophagitis: Secondary | ICD-10-CM | POA: Diagnosis not present

## 2019-05-29 DIAGNOSIS — M6281 Muscle weakness (generalized): Secondary | ICD-10-CM | POA: Diagnosis not present

## 2019-06-01 DIAGNOSIS — K219 Gastro-esophageal reflux disease without esophagitis: Secondary | ICD-10-CM | POA: Diagnosis not present

## 2019-06-01 DIAGNOSIS — I10 Essential (primary) hypertension: Secondary | ICD-10-CM | POA: Diagnosis not present

## 2019-06-01 DIAGNOSIS — I5020 Unspecified systolic (congestive) heart failure: Secondary | ICD-10-CM | POA: Diagnosis not present

## 2019-06-01 DIAGNOSIS — I639 Cerebral infarction, unspecified: Secondary | ICD-10-CM | POA: Diagnosis not present

## 2019-06-01 DIAGNOSIS — M6281 Muscle weakness (generalized): Secondary | ICD-10-CM | POA: Diagnosis not present

## 2019-06-01 DIAGNOSIS — M48061 Spinal stenosis, lumbar region without neurogenic claudication: Secondary | ICD-10-CM | POA: Diagnosis not present

## 2019-06-01 DIAGNOSIS — R278 Other lack of coordination: Secondary | ICD-10-CM | POA: Diagnosis not present

## 2019-06-01 DIAGNOSIS — R2689 Other abnormalities of gait and mobility: Secondary | ICD-10-CM | POA: Diagnosis not present

## 2019-06-01 DIAGNOSIS — R1312 Dysphagia, oropharyngeal phase: Secondary | ICD-10-CM | POA: Diagnosis not present

## 2019-06-02 DIAGNOSIS — I5020 Unspecified systolic (congestive) heart failure: Secondary | ICD-10-CM | POA: Diagnosis not present

## 2019-06-02 DIAGNOSIS — I10 Essential (primary) hypertension: Secondary | ICD-10-CM | POA: Diagnosis not present

## 2019-06-02 DIAGNOSIS — M6281 Muscle weakness (generalized): Secondary | ICD-10-CM | POA: Diagnosis not present

## 2019-06-02 DIAGNOSIS — R1312 Dysphagia, oropharyngeal phase: Secondary | ICD-10-CM | POA: Diagnosis not present

## 2019-06-02 DIAGNOSIS — K219 Gastro-esophageal reflux disease without esophagitis: Secondary | ICD-10-CM | POA: Diagnosis not present

## 2019-06-02 DIAGNOSIS — I639 Cerebral infarction, unspecified: Secondary | ICD-10-CM | POA: Diagnosis not present

## 2019-06-02 DIAGNOSIS — R278 Other lack of coordination: Secondary | ICD-10-CM | POA: Diagnosis not present

## 2019-06-02 DIAGNOSIS — M48061 Spinal stenosis, lumbar region without neurogenic claudication: Secondary | ICD-10-CM | POA: Diagnosis not present

## 2019-06-03 DIAGNOSIS — M48061 Spinal stenosis, lumbar region without neurogenic claudication: Secondary | ICD-10-CM | POA: Diagnosis not present

## 2019-06-03 DIAGNOSIS — I5020 Unspecified systolic (congestive) heart failure: Secondary | ICD-10-CM | POA: Diagnosis not present

## 2019-06-03 DIAGNOSIS — I10 Essential (primary) hypertension: Secondary | ICD-10-CM | POA: Diagnosis not present

## 2019-06-03 DIAGNOSIS — R1312 Dysphagia, oropharyngeal phase: Secondary | ICD-10-CM | POA: Diagnosis not present

## 2019-06-03 DIAGNOSIS — I639 Cerebral infarction, unspecified: Secondary | ICD-10-CM | POA: Diagnosis not present

## 2019-06-03 DIAGNOSIS — K219 Gastro-esophageal reflux disease without esophagitis: Secondary | ICD-10-CM | POA: Diagnosis not present

## 2019-06-03 DIAGNOSIS — M6281 Muscle weakness (generalized): Secondary | ICD-10-CM | POA: Diagnosis not present

## 2019-06-03 DIAGNOSIS — R278 Other lack of coordination: Secondary | ICD-10-CM | POA: Diagnosis not present

## 2019-06-04 DIAGNOSIS — I10 Essential (primary) hypertension: Secondary | ICD-10-CM | POA: Diagnosis not present

## 2019-06-04 DIAGNOSIS — R278 Other lack of coordination: Secondary | ICD-10-CM | POA: Diagnosis not present

## 2019-06-04 DIAGNOSIS — M48061 Spinal stenosis, lumbar region without neurogenic claudication: Secondary | ICD-10-CM | POA: Diagnosis not present

## 2019-06-04 DIAGNOSIS — M6281 Muscle weakness (generalized): Secondary | ICD-10-CM | POA: Diagnosis not present

## 2019-06-04 DIAGNOSIS — R1312 Dysphagia, oropharyngeal phase: Secondary | ICD-10-CM | POA: Diagnosis not present

## 2019-06-04 DIAGNOSIS — I639 Cerebral infarction, unspecified: Secondary | ICD-10-CM | POA: Diagnosis not present

## 2019-06-04 DIAGNOSIS — I5020 Unspecified systolic (congestive) heart failure: Secondary | ICD-10-CM | POA: Diagnosis not present

## 2019-06-04 DIAGNOSIS — K219 Gastro-esophageal reflux disease without esophagitis: Secondary | ICD-10-CM | POA: Diagnosis not present

## 2019-06-05 DIAGNOSIS — I639 Cerebral infarction, unspecified: Secondary | ICD-10-CM | POA: Diagnosis not present

## 2019-06-05 DIAGNOSIS — I5020 Unspecified systolic (congestive) heart failure: Secondary | ICD-10-CM | POA: Diagnosis not present

## 2019-06-05 DIAGNOSIS — R1312 Dysphagia, oropharyngeal phase: Secondary | ICD-10-CM | POA: Diagnosis not present

## 2019-06-05 DIAGNOSIS — I10 Essential (primary) hypertension: Secondary | ICD-10-CM | POA: Diagnosis not present

## 2019-06-05 DIAGNOSIS — K219 Gastro-esophageal reflux disease without esophagitis: Secondary | ICD-10-CM | POA: Diagnosis not present

## 2019-06-05 DIAGNOSIS — R278 Other lack of coordination: Secondary | ICD-10-CM | POA: Diagnosis not present

## 2019-06-05 DIAGNOSIS — M48061 Spinal stenosis, lumbar region without neurogenic claudication: Secondary | ICD-10-CM | POA: Diagnosis not present

## 2019-06-05 DIAGNOSIS — M6281 Muscle weakness (generalized): Secondary | ICD-10-CM | POA: Diagnosis not present

## 2019-06-08 DIAGNOSIS — Z1159 Encounter for screening for other viral diseases: Secondary | ICD-10-CM | POA: Diagnosis not present

## 2019-06-08 DIAGNOSIS — K219 Gastro-esophageal reflux disease without esophagitis: Secondary | ICD-10-CM | POA: Diagnosis not present

## 2019-06-08 DIAGNOSIS — I639 Cerebral infarction, unspecified: Secondary | ICD-10-CM | POA: Diagnosis not present

## 2019-06-08 DIAGNOSIS — M6281 Muscle weakness (generalized): Secondary | ICD-10-CM | POA: Diagnosis not present

## 2019-06-08 DIAGNOSIS — I10 Essential (primary) hypertension: Secondary | ICD-10-CM | POA: Diagnosis not present

## 2019-06-08 DIAGNOSIS — G309 Alzheimer's disease, unspecified: Secondary | ICD-10-CM | POA: Diagnosis not present

## 2019-06-08 DIAGNOSIS — R1312 Dysphagia, oropharyngeal phase: Secondary | ICD-10-CM | POA: Diagnosis not present

## 2019-06-08 DIAGNOSIS — R278 Other lack of coordination: Secondary | ICD-10-CM | POA: Diagnosis not present

## 2019-06-08 DIAGNOSIS — M48061 Spinal stenosis, lumbar region without neurogenic claudication: Secondary | ICD-10-CM | POA: Diagnosis not present

## 2019-06-08 DIAGNOSIS — I5020 Unspecified systolic (congestive) heart failure: Secondary | ICD-10-CM | POA: Diagnosis not present

## 2019-06-09 DIAGNOSIS — K219 Gastro-esophageal reflux disease without esophagitis: Secondary | ICD-10-CM | POA: Diagnosis not present

## 2019-06-09 DIAGNOSIS — I10 Essential (primary) hypertension: Secondary | ICD-10-CM | POA: Diagnosis not present

## 2019-06-09 DIAGNOSIS — R278 Other lack of coordination: Secondary | ICD-10-CM | POA: Diagnosis not present

## 2019-06-09 DIAGNOSIS — R1312 Dysphagia, oropharyngeal phase: Secondary | ICD-10-CM | POA: Diagnosis not present

## 2019-06-09 DIAGNOSIS — I639 Cerebral infarction, unspecified: Secondary | ICD-10-CM | POA: Diagnosis not present

## 2019-06-09 DIAGNOSIS — M6281 Muscle weakness (generalized): Secondary | ICD-10-CM | POA: Diagnosis not present

## 2019-06-09 DIAGNOSIS — I5020 Unspecified systolic (congestive) heart failure: Secondary | ICD-10-CM | POA: Diagnosis not present

## 2019-06-09 DIAGNOSIS — M48061 Spinal stenosis, lumbar region without neurogenic claudication: Secondary | ICD-10-CM | POA: Diagnosis not present

## 2019-06-10 DIAGNOSIS — K219 Gastro-esophageal reflux disease without esophagitis: Secondary | ICD-10-CM | POA: Diagnosis not present

## 2019-06-10 DIAGNOSIS — R1312 Dysphagia, oropharyngeal phase: Secondary | ICD-10-CM | POA: Diagnosis not present

## 2019-06-10 DIAGNOSIS — M6281 Muscle weakness (generalized): Secondary | ICD-10-CM | POA: Diagnosis not present

## 2019-06-10 DIAGNOSIS — R278 Other lack of coordination: Secondary | ICD-10-CM | POA: Diagnosis not present

## 2019-06-10 DIAGNOSIS — I5020 Unspecified systolic (congestive) heart failure: Secondary | ICD-10-CM | POA: Diagnosis not present

## 2019-06-10 DIAGNOSIS — I10 Essential (primary) hypertension: Secondary | ICD-10-CM | POA: Diagnosis not present

## 2019-06-10 DIAGNOSIS — I639 Cerebral infarction, unspecified: Secondary | ICD-10-CM | POA: Diagnosis not present

## 2019-06-10 DIAGNOSIS — M48061 Spinal stenosis, lumbar region without neurogenic claudication: Secondary | ICD-10-CM | POA: Diagnosis not present

## 2019-06-11 DIAGNOSIS — I5020 Unspecified systolic (congestive) heart failure: Secondary | ICD-10-CM | POA: Diagnosis not present

## 2019-06-11 DIAGNOSIS — I639 Cerebral infarction, unspecified: Secondary | ICD-10-CM | POA: Diagnosis not present

## 2019-06-11 DIAGNOSIS — K219 Gastro-esophageal reflux disease without esophagitis: Secondary | ICD-10-CM | POA: Diagnosis not present

## 2019-06-11 DIAGNOSIS — M6281 Muscle weakness (generalized): Secondary | ICD-10-CM | POA: Diagnosis not present

## 2019-06-11 DIAGNOSIS — M48061 Spinal stenosis, lumbar region without neurogenic claudication: Secondary | ICD-10-CM | POA: Diagnosis not present

## 2019-06-11 DIAGNOSIS — I10 Essential (primary) hypertension: Secondary | ICD-10-CM | POA: Diagnosis not present

## 2019-06-11 DIAGNOSIS — R1312 Dysphagia, oropharyngeal phase: Secondary | ICD-10-CM | POA: Diagnosis not present

## 2019-06-11 DIAGNOSIS — R278 Other lack of coordination: Secondary | ICD-10-CM | POA: Diagnosis not present

## 2019-06-12 DIAGNOSIS — R278 Other lack of coordination: Secondary | ICD-10-CM | POA: Diagnosis not present

## 2019-06-12 DIAGNOSIS — M6281 Muscle weakness (generalized): Secondary | ICD-10-CM | POA: Diagnosis not present

## 2019-06-12 DIAGNOSIS — I10 Essential (primary) hypertension: Secondary | ICD-10-CM | POA: Diagnosis not present

## 2019-06-12 DIAGNOSIS — M48061 Spinal stenosis, lumbar region without neurogenic claudication: Secondary | ICD-10-CM | POA: Diagnosis not present

## 2019-06-12 DIAGNOSIS — K219 Gastro-esophageal reflux disease without esophagitis: Secondary | ICD-10-CM | POA: Diagnosis not present

## 2019-06-12 DIAGNOSIS — R1312 Dysphagia, oropharyngeal phase: Secondary | ICD-10-CM | POA: Diagnosis not present

## 2019-06-12 DIAGNOSIS — I639 Cerebral infarction, unspecified: Secondary | ICD-10-CM | POA: Diagnosis not present

## 2019-06-12 DIAGNOSIS — I5020 Unspecified systolic (congestive) heart failure: Secondary | ICD-10-CM | POA: Diagnosis not present

## 2019-06-15 DIAGNOSIS — R1312 Dysphagia, oropharyngeal phase: Secondary | ICD-10-CM | POA: Diagnosis not present

## 2019-06-15 DIAGNOSIS — R279 Unspecified lack of coordination: Secondary | ICD-10-CM | POA: Diagnosis not present

## 2019-06-15 DIAGNOSIS — R2689 Other abnormalities of gait and mobility: Secondary | ICD-10-CM | POA: Diagnosis not present

## 2019-06-15 DIAGNOSIS — I5020 Unspecified systolic (congestive) heart failure: Secondary | ICD-10-CM | POA: Diagnosis not present

## 2019-06-15 DIAGNOSIS — M48061 Spinal stenosis, lumbar region without neurogenic claudication: Secondary | ICD-10-CM | POA: Diagnosis not present

## 2019-06-15 DIAGNOSIS — K219 Gastro-esophageal reflux disease without esophagitis: Secondary | ICD-10-CM | POA: Diagnosis not present

## 2019-06-15 DIAGNOSIS — E559 Vitamin D deficiency, unspecified: Secondary | ICD-10-CM | POA: Diagnosis not present

## 2019-06-15 DIAGNOSIS — R627 Adult failure to thrive: Secondary | ICD-10-CM | POA: Diagnosis not present

## 2019-06-15 DIAGNOSIS — M6281 Muscle weakness (generalized): Secondary | ICD-10-CM | POA: Diagnosis not present

## 2019-06-15 DIAGNOSIS — I639 Cerebral infarction, unspecified: Secondary | ICD-10-CM | POA: Diagnosis not present

## 2019-06-16 DIAGNOSIS — R1312 Dysphagia, oropharyngeal phase: Secondary | ICD-10-CM | POA: Diagnosis not present

## 2019-06-16 DIAGNOSIS — K219 Gastro-esophageal reflux disease without esophagitis: Secondary | ICD-10-CM | POA: Diagnosis not present

## 2019-06-16 DIAGNOSIS — I639 Cerebral infarction, unspecified: Secondary | ICD-10-CM | POA: Diagnosis not present

## 2019-06-16 DIAGNOSIS — I5020 Unspecified systolic (congestive) heart failure: Secondary | ICD-10-CM | POA: Diagnosis not present

## 2019-06-16 DIAGNOSIS — M48061 Spinal stenosis, lumbar region without neurogenic claudication: Secondary | ICD-10-CM | POA: Diagnosis not present

## 2019-06-16 DIAGNOSIS — R279 Unspecified lack of coordination: Secondary | ICD-10-CM | POA: Diagnosis not present

## 2019-06-16 DIAGNOSIS — M6281 Muscle weakness (generalized): Secondary | ICD-10-CM | POA: Diagnosis not present

## 2019-06-16 DIAGNOSIS — E559 Vitamin D deficiency, unspecified: Secondary | ICD-10-CM | POA: Diagnosis not present

## 2019-06-16 DIAGNOSIS — R2689 Other abnormalities of gait and mobility: Secondary | ICD-10-CM | POA: Diagnosis not present

## 2019-06-17 DIAGNOSIS — E559 Vitamin D deficiency, unspecified: Secondary | ICD-10-CM | POA: Diagnosis not present

## 2019-06-17 DIAGNOSIS — I5020 Unspecified systolic (congestive) heart failure: Secondary | ICD-10-CM | POA: Diagnosis not present

## 2019-06-17 DIAGNOSIS — M48061 Spinal stenosis, lumbar region without neurogenic claudication: Secondary | ICD-10-CM | POA: Diagnosis not present

## 2019-06-17 DIAGNOSIS — R279 Unspecified lack of coordination: Secondary | ICD-10-CM | POA: Diagnosis not present

## 2019-06-17 DIAGNOSIS — I639 Cerebral infarction, unspecified: Secondary | ICD-10-CM | POA: Diagnosis not present

## 2019-06-17 DIAGNOSIS — M6281 Muscle weakness (generalized): Secondary | ICD-10-CM | POA: Diagnosis not present

## 2019-06-17 DIAGNOSIS — R2689 Other abnormalities of gait and mobility: Secondary | ICD-10-CM | POA: Diagnosis not present

## 2019-06-17 DIAGNOSIS — R1312 Dysphagia, oropharyngeal phase: Secondary | ICD-10-CM | POA: Diagnosis not present

## 2019-06-17 DIAGNOSIS — K219 Gastro-esophageal reflux disease without esophagitis: Secondary | ICD-10-CM | POA: Diagnosis not present

## 2019-06-18 DIAGNOSIS — R1312 Dysphagia, oropharyngeal phase: Secondary | ICD-10-CM | POA: Diagnosis not present

## 2019-06-18 DIAGNOSIS — K219 Gastro-esophageal reflux disease without esophagitis: Secondary | ICD-10-CM | POA: Diagnosis not present

## 2019-06-18 DIAGNOSIS — R279 Unspecified lack of coordination: Secondary | ICD-10-CM | POA: Diagnosis not present

## 2019-06-18 DIAGNOSIS — R2689 Other abnormalities of gait and mobility: Secondary | ICD-10-CM | POA: Diagnosis not present

## 2019-06-18 DIAGNOSIS — I5020 Unspecified systolic (congestive) heart failure: Secondary | ICD-10-CM | POA: Diagnosis not present

## 2019-06-18 DIAGNOSIS — M6281 Muscle weakness (generalized): Secondary | ICD-10-CM | POA: Diagnosis not present

## 2019-06-18 DIAGNOSIS — I639 Cerebral infarction, unspecified: Secondary | ICD-10-CM | POA: Diagnosis not present

## 2019-06-18 DIAGNOSIS — E559 Vitamin D deficiency, unspecified: Secondary | ICD-10-CM | POA: Diagnosis not present

## 2019-06-18 DIAGNOSIS — M48061 Spinal stenosis, lumbar region without neurogenic claudication: Secondary | ICD-10-CM | POA: Diagnosis not present

## 2019-06-19 DIAGNOSIS — R2689 Other abnormalities of gait and mobility: Secondary | ICD-10-CM | POA: Diagnosis not present

## 2019-06-19 DIAGNOSIS — R279 Unspecified lack of coordination: Secondary | ICD-10-CM | POA: Diagnosis not present

## 2019-06-19 DIAGNOSIS — K219 Gastro-esophageal reflux disease without esophagitis: Secondary | ICD-10-CM | POA: Diagnosis not present

## 2019-06-19 DIAGNOSIS — E559 Vitamin D deficiency, unspecified: Secondary | ICD-10-CM | POA: Diagnosis not present

## 2019-06-19 DIAGNOSIS — M48061 Spinal stenosis, lumbar region without neurogenic claudication: Secondary | ICD-10-CM | POA: Diagnosis not present

## 2019-06-19 DIAGNOSIS — I5020 Unspecified systolic (congestive) heart failure: Secondary | ICD-10-CM | POA: Diagnosis not present

## 2019-06-19 DIAGNOSIS — R1312 Dysphagia, oropharyngeal phase: Secondary | ICD-10-CM | POA: Diagnosis not present

## 2019-06-19 DIAGNOSIS — M6281 Muscle weakness (generalized): Secondary | ICD-10-CM | POA: Diagnosis not present

## 2019-06-19 DIAGNOSIS — I639 Cerebral infarction, unspecified: Secondary | ICD-10-CM | POA: Diagnosis not present

## 2019-06-22 DIAGNOSIS — I5020 Unspecified systolic (congestive) heart failure: Secondary | ICD-10-CM | POA: Diagnosis not present

## 2019-06-22 DIAGNOSIS — M6281 Muscle weakness (generalized): Secondary | ICD-10-CM | POA: Diagnosis not present

## 2019-06-22 DIAGNOSIS — R2689 Other abnormalities of gait and mobility: Secondary | ICD-10-CM | POA: Diagnosis not present

## 2019-06-22 DIAGNOSIS — R279 Unspecified lack of coordination: Secondary | ICD-10-CM | POA: Diagnosis not present

## 2019-06-22 DIAGNOSIS — M48061 Spinal stenosis, lumbar region without neurogenic claudication: Secondary | ICD-10-CM | POA: Diagnosis not present

## 2019-06-22 DIAGNOSIS — E559 Vitamin D deficiency, unspecified: Secondary | ICD-10-CM | POA: Diagnosis not present

## 2019-06-22 DIAGNOSIS — R1312 Dysphagia, oropharyngeal phase: Secondary | ICD-10-CM | POA: Diagnosis not present

## 2019-06-22 DIAGNOSIS — I639 Cerebral infarction, unspecified: Secondary | ICD-10-CM | POA: Diagnosis not present

## 2019-06-22 DIAGNOSIS — K219 Gastro-esophageal reflux disease without esophagitis: Secondary | ICD-10-CM | POA: Diagnosis not present

## 2019-06-23 DIAGNOSIS — R279 Unspecified lack of coordination: Secondary | ICD-10-CM | POA: Diagnosis not present

## 2019-06-23 DIAGNOSIS — R2689 Other abnormalities of gait and mobility: Secondary | ICD-10-CM | POA: Diagnosis not present

## 2019-06-23 DIAGNOSIS — I639 Cerebral infarction, unspecified: Secondary | ICD-10-CM | POA: Diagnosis not present

## 2019-06-23 DIAGNOSIS — M6281 Muscle weakness (generalized): Secondary | ICD-10-CM | POA: Diagnosis not present

## 2019-06-23 DIAGNOSIS — M48061 Spinal stenosis, lumbar region without neurogenic claudication: Secondary | ICD-10-CM | POA: Diagnosis not present

## 2019-06-23 DIAGNOSIS — R1312 Dysphagia, oropharyngeal phase: Secondary | ICD-10-CM | POA: Diagnosis not present

## 2019-06-23 DIAGNOSIS — I5020 Unspecified systolic (congestive) heart failure: Secondary | ICD-10-CM | POA: Diagnosis not present

## 2019-06-23 DIAGNOSIS — K219 Gastro-esophageal reflux disease without esophagitis: Secondary | ICD-10-CM | POA: Diagnosis not present

## 2019-06-23 DIAGNOSIS — E559 Vitamin D deficiency, unspecified: Secondary | ICD-10-CM | POA: Diagnosis not present

## 2019-06-24 DIAGNOSIS — M48061 Spinal stenosis, lumbar region without neurogenic claudication: Secondary | ICD-10-CM | POA: Diagnosis not present

## 2019-06-24 DIAGNOSIS — E559 Vitamin D deficiency, unspecified: Secondary | ICD-10-CM | POA: Diagnosis not present

## 2019-06-24 DIAGNOSIS — I5020 Unspecified systolic (congestive) heart failure: Secondary | ICD-10-CM | POA: Diagnosis not present

## 2019-06-24 DIAGNOSIS — R279 Unspecified lack of coordination: Secondary | ICD-10-CM | POA: Diagnosis not present

## 2019-06-24 DIAGNOSIS — I639 Cerebral infarction, unspecified: Secondary | ICD-10-CM | POA: Diagnosis not present

## 2019-06-24 DIAGNOSIS — K219 Gastro-esophageal reflux disease without esophagitis: Secondary | ICD-10-CM | POA: Diagnosis not present

## 2019-06-24 DIAGNOSIS — R2689 Other abnormalities of gait and mobility: Secondary | ICD-10-CM | POA: Diagnosis not present

## 2019-06-24 DIAGNOSIS — M6281 Muscle weakness (generalized): Secondary | ICD-10-CM | POA: Diagnosis not present

## 2019-06-24 DIAGNOSIS — R1312 Dysphagia, oropharyngeal phase: Secondary | ICD-10-CM | POA: Diagnosis not present

## 2019-06-25 DIAGNOSIS — R1312 Dysphagia, oropharyngeal phase: Secondary | ICD-10-CM | POA: Diagnosis not present

## 2019-06-25 DIAGNOSIS — E559 Vitamin D deficiency, unspecified: Secondary | ICD-10-CM | POA: Diagnosis not present

## 2019-06-25 DIAGNOSIS — R279 Unspecified lack of coordination: Secondary | ICD-10-CM | POA: Diagnosis not present

## 2019-06-25 DIAGNOSIS — M48061 Spinal stenosis, lumbar region without neurogenic claudication: Secondary | ICD-10-CM | POA: Diagnosis not present

## 2019-06-25 DIAGNOSIS — M6281 Muscle weakness (generalized): Secondary | ICD-10-CM | POA: Diagnosis not present

## 2019-06-25 DIAGNOSIS — I639 Cerebral infarction, unspecified: Secondary | ICD-10-CM | POA: Diagnosis not present

## 2019-06-25 DIAGNOSIS — I5020 Unspecified systolic (congestive) heart failure: Secondary | ICD-10-CM | POA: Diagnosis not present

## 2019-06-25 DIAGNOSIS — R2689 Other abnormalities of gait and mobility: Secondary | ICD-10-CM | POA: Diagnosis not present

## 2019-06-25 DIAGNOSIS — K219 Gastro-esophageal reflux disease without esophagitis: Secondary | ICD-10-CM | POA: Diagnosis not present

## 2019-06-26 DIAGNOSIS — R1312 Dysphagia, oropharyngeal phase: Secondary | ICD-10-CM | POA: Diagnosis not present

## 2019-06-26 DIAGNOSIS — I639 Cerebral infarction, unspecified: Secondary | ICD-10-CM | POA: Diagnosis not present

## 2019-06-26 DIAGNOSIS — I5020 Unspecified systolic (congestive) heart failure: Secondary | ICD-10-CM | POA: Diagnosis not present

## 2019-06-26 DIAGNOSIS — R279 Unspecified lack of coordination: Secondary | ICD-10-CM | POA: Diagnosis not present

## 2019-06-26 DIAGNOSIS — M48061 Spinal stenosis, lumbar region without neurogenic claudication: Secondary | ICD-10-CM | POA: Diagnosis not present

## 2019-06-26 DIAGNOSIS — M6281 Muscle weakness (generalized): Secondary | ICD-10-CM | POA: Diagnosis not present

## 2019-06-26 DIAGNOSIS — R2689 Other abnormalities of gait and mobility: Secondary | ICD-10-CM | POA: Diagnosis not present

## 2019-06-26 DIAGNOSIS — E559 Vitamin D deficiency, unspecified: Secondary | ICD-10-CM | POA: Diagnosis not present

## 2019-06-26 DIAGNOSIS — K219 Gastro-esophageal reflux disease without esophagitis: Secondary | ICD-10-CM | POA: Diagnosis not present

## 2019-06-29 DIAGNOSIS — E559 Vitamin D deficiency, unspecified: Secondary | ICD-10-CM | POA: Diagnosis not present

## 2019-06-29 DIAGNOSIS — R2689 Other abnormalities of gait and mobility: Secondary | ICD-10-CM | POA: Diagnosis not present

## 2019-06-29 DIAGNOSIS — K219 Gastro-esophageal reflux disease without esophagitis: Secondary | ICD-10-CM | POA: Diagnosis not present

## 2019-06-29 DIAGNOSIS — R1312 Dysphagia, oropharyngeal phase: Secondary | ICD-10-CM | POA: Diagnosis not present

## 2019-06-29 DIAGNOSIS — I639 Cerebral infarction, unspecified: Secondary | ICD-10-CM | POA: Diagnosis not present

## 2019-06-29 DIAGNOSIS — I5020 Unspecified systolic (congestive) heart failure: Secondary | ICD-10-CM | POA: Diagnosis not present

## 2019-06-29 DIAGNOSIS — M6281 Muscle weakness (generalized): Secondary | ICD-10-CM | POA: Diagnosis not present

## 2019-06-29 DIAGNOSIS — M48061 Spinal stenosis, lumbar region without neurogenic claudication: Secondary | ICD-10-CM | POA: Diagnosis not present

## 2019-06-29 DIAGNOSIS — R279 Unspecified lack of coordination: Secondary | ICD-10-CM | POA: Diagnosis not present

## 2019-06-30 DIAGNOSIS — E559 Vitamin D deficiency, unspecified: Secondary | ICD-10-CM | POA: Diagnosis not present

## 2019-06-30 DIAGNOSIS — I639 Cerebral infarction, unspecified: Secondary | ICD-10-CM | POA: Diagnosis not present

## 2019-06-30 DIAGNOSIS — I5020 Unspecified systolic (congestive) heart failure: Secondary | ICD-10-CM | POA: Diagnosis not present

## 2019-06-30 DIAGNOSIS — R2689 Other abnormalities of gait and mobility: Secondary | ICD-10-CM | POA: Diagnosis not present

## 2019-06-30 DIAGNOSIS — M48061 Spinal stenosis, lumbar region without neurogenic claudication: Secondary | ICD-10-CM | POA: Diagnosis not present

## 2019-06-30 DIAGNOSIS — M6281 Muscle weakness (generalized): Secondary | ICD-10-CM | POA: Diagnosis not present

## 2019-06-30 DIAGNOSIS — R1312 Dysphagia, oropharyngeal phase: Secondary | ICD-10-CM | POA: Diagnosis not present

## 2019-06-30 DIAGNOSIS — K219 Gastro-esophageal reflux disease without esophagitis: Secondary | ICD-10-CM | POA: Diagnosis not present

## 2019-06-30 DIAGNOSIS — R279 Unspecified lack of coordination: Secondary | ICD-10-CM | POA: Diagnosis not present

## 2019-07-01 DIAGNOSIS — I5020 Unspecified systolic (congestive) heart failure: Secondary | ICD-10-CM | POA: Diagnosis not present

## 2019-07-01 DIAGNOSIS — K219 Gastro-esophageal reflux disease without esophagitis: Secondary | ICD-10-CM | POA: Diagnosis not present

## 2019-07-01 DIAGNOSIS — R279 Unspecified lack of coordination: Secondary | ICD-10-CM | POA: Diagnosis not present

## 2019-07-01 DIAGNOSIS — R1312 Dysphagia, oropharyngeal phase: Secondary | ICD-10-CM | POA: Diagnosis not present

## 2019-07-01 DIAGNOSIS — M6281 Muscle weakness (generalized): Secondary | ICD-10-CM | POA: Diagnosis not present

## 2019-07-01 DIAGNOSIS — E559 Vitamin D deficiency, unspecified: Secondary | ICD-10-CM | POA: Diagnosis not present

## 2019-07-01 DIAGNOSIS — M48061 Spinal stenosis, lumbar region without neurogenic claudication: Secondary | ICD-10-CM | POA: Diagnosis not present

## 2019-07-01 DIAGNOSIS — I639 Cerebral infarction, unspecified: Secondary | ICD-10-CM | POA: Diagnosis not present

## 2019-07-01 DIAGNOSIS — R2689 Other abnormalities of gait and mobility: Secondary | ICD-10-CM | POA: Diagnosis not present

## 2019-07-02 DIAGNOSIS — R2689 Other abnormalities of gait and mobility: Secondary | ICD-10-CM | POA: Diagnosis not present

## 2019-07-02 DIAGNOSIS — R1312 Dysphagia, oropharyngeal phase: Secondary | ICD-10-CM | POA: Diagnosis not present

## 2019-07-02 DIAGNOSIS — R279 Unspecified lack of coordination: Secondary | ICD-10-CM | POA: Diagnosis not present

## 2019-07-02 DIAGNOSIS — E559 Vitamin D deficiency, unspecified: Secondary | ICD-10-CM | POA: Diagnosis not present

## 2019-07-02 DIAGNOSIS — I639 Cerebral infarction, unspecified: Secondary | ICD-10-CM | POA: Diagnosis not present

## 2019-07-02 DIAGNOSIS — M6281 Muscle weakness (generalized): Secondary | ICD-10-CM | POA: Diagnosis not present

## 2019-07-02 DIAGNOSIS — M48061 Spinal stenosis, lumbar region without neurogenic claudication: Secondary | ICD-10-CM | POA: Diagnosis not present

## 2019-07-02 DIAGNOSIS — K219 Gastro-esophageal reflux disease without esophagitis: Secondary | ICD-10-CM | POA: Diagnosis not present

## 2019-07-02 DIAGNOSIS — I5020 Unspecified systolic (congestive) heart failure: Secondary | ICD-10-CM | POA: Diagnosis not present

## 2019-07-03 DIAGNOSIS — R279 Unspecified lack of coordination: Secondary | ICD-10-CM | POA: Diagnosis not present

## 2019-07-03 DIAGNOSIS — M48061 Spinal stenosis, lumbar region without neurogenic claudication: Secondary | ICD-10-CM | POA: Diagnosis not present

## 2019-07-03 DIAGNOSIS — R2689 Other abnormalities of gait and mobility: Secondary | ICD-10-CM | POA: Diagnosis not present

## 2019-07-03 DIAGNOSIS — I639 Cerebral infarction, unspecified: Secondary | ICD-10-CM | POA: Diagnosis not present

## 2019-07-03 DIAGNOSIS — K219 Gastro-esophageal reflux disease without esophagitis: Secondary | ICD-10-CM | POA: Diagnosis not present

## 2019-07-03 DIAGNOSIS — I5020 Unspecified systolic (congestive) heart failure: Secondary | ICD-10-CM | POA: Diagnosis not present

## 2019-07-03 DIAGNOSIS — R1312 Dysphagia, oropharyngeal phase: Secondary | ICD-10-CM | POA: Diagnosis not present

## 2019-07-03 DIAGNOSIS — E559 Vitamin D deficiency, unspecified: Secondary | ICD-10-CM | POA: Diagnosis not present

## 2019-07-03 DIAGNOSIS — M6281 Muscle weakness (generalized): Secondary | ICD-10-CM | POA: Diagnosis not present

## 2019-07-06 DIAGNOSIS — I5020 Unspecified systolic (congestive) heart failure: Secondary | ICD-10-CM | POA: Diagnosis not present

## 2019-07-06 DIAGNOSIS — I639 Cerebral infarction, unspecified: Secondary | ICD-10-CM | POA: Diagnosis not present

## 2019-07-06 DIAGNOSIS — R279 Unspecified lack of coordination: Secondary | ICD-10-CM | POA: Diagnosis not present

## 2019-07-06 DIAGNOSIS — E559 Vitamin D deficiency, unspecified: Secondary | ICD-10-CM | POA: Diagnosis not present

## 2019-07-06 DIAGNOSIS — S50312A Abrasion of left elbow, initial encounter: Secondary | ICD-10-CM | POA: Diagnosis not present

## 2019-07-06 DIAGNOSIS — K219 Gastro-esophageal reflux disease without esophagitis: Secondary | ICD-10-CM | POA: Diagnosis not present

## 2019-07-06 DIAGNOSIS — M6281 Muscle weakness (generalized): Secondary | ICD-10-CM | POA: Diagnosis not present

## 2019-07-06 DIAGNOSIS — R2689 Other abnormalities of gait and mobility: Secondary | ICD-10-CM | POA: Diagnosis not present

## 2019-07-06 DIAGNOSIS — M48061 Spinal stenosis, lumbar region without neurogenic claudication: Secondary | ICD-10-CM | POA: Diagnosis not present

## 2019-07-06 DIAGNOSIS — R1312 Dysphagia, oropharyngeal phase: Secondary | ICD-10-CM | POA: Diagnosis not present

## 2019-07-07 DIAGNOSIS — R2689 Other abnormalities of gait and mobility: Secondary | ICD-10-CM | POA: Diagnosis not present

## 2019-07-07 DIAGNOSIS — R1312 Dysphagia, oropharyngeal phase: Secondary | ICD-10-CM | POA: Diagnosis not present

## 2019-07-07 DIAGNOSIS — E559 Vitamin D deficiency, unspecified: Secondary | ICD-10-CM | POA: Diagnosis not present

## 2019-07-07 DIAGNOSIS — I5020 Unspecified systolic (congestive) heart failure: Secondary | ICD-10-CM | POA: Diagnosis not present

## 2019-07-07 DIAGNOSIS — I639 Cerebral infarction, unspecified: Secondary | ICD-10-CM | POA: Diagnosis not present

## 2019-07-07 DIAGNOSIS — M6281 Muscle weakness (generalized): Secondary | ICD-10-CM | POA: Diagnosis not present

## 2019-07-07 DIAGNOSIS — M48061 Spinal stenosis, lumbar region without neurogenic claudication: Secondary | ICD-10-CM | POA: Diagnosis not present

## 2019-07-07 DIAGNOSIS — K219 Gastro-esophageal reflux disease without esophagitis: Secondary | ICD-10-CM | POA: Diagnosis not present

## 2019-07-07 DIAGNOSIS — R279 Unspecified lack of coordination: Secondary | ICD-10-CM | POA: Diagnosis not present

## 2019-07-08 DIAGNOSIS — I5020 Unspecified systolic (congestive) heart failure: Secondary | ICD-10-CM | POA: Diagnosis not present

## 2019-07-08 DIAGNOSIS — R279 Unspecified lack of coordination: Secondary | ICD-10-CM | POA: Diagnosis not present

## 2019-07-08 DIAGNOSIS — K219 Gastro-esophageal reflux disease without esophagitis: Secondary | ICD-10-CM | POA: Diagnosis not present

## 2019-07-08 DIAGNOSIS — E559 Vitamin D deficiency, unspecified: Secondary | ICD-10-CM | POA: Diagnosis not present

## 2019-07-08 DIAGNOSIS — R2689 Other abnormalities of gait and mobility: Secondary | ICD-10-CM | POA: Diagnosis not present

## 2019-07-08 DIAGNOSIS — M48061 Spinal stenosis, lumbar region without neurogenic claudication: Secondary | ICD-10-CM | POA: Diagnosis not present

## 2019-07-08 DIAGNOSIS — R1312 Dysphagia, oropharyngeal phase: Secondary | ICD-10-CM | POA: Diagnosis not present

## 2019-07-08 DIAGNOSIS — M6281 Muscle weakness (generalized): Secondary | ICD-10-CM | POA: Diagnosis not present

## 2019-07-08 DIAGNOSIS — I639 Cerebral infarction, unspecified: Secondary | ICD-10-CM | POA: Diagnosis not present

## 2019-07-09 DIAGNOSIS — R531 Weakness: Secondary | ICD-10-CM | POA: Diagnosis not present

## 2019-07-09 DIAGNOSIS — M6281 Muscle weakness (generalized): Secondary | ICD-10-CM | POA: Diagnosis not present

## 2019-07-09 DIAGNOSIS — K219 Gastro-esophageal reflux disease without esophagitis: Secondary | ICD-10-CM | POA: Diagnosis not present

## 2019-07-09 DIAGNOSIS — I639 Cerebral infarction, unspecified: Secondary | ICD-10-CM | POA: Diagnosis not present

## 2019-07-09 DIAGNOSIS — R1312 Dysphagia, oropharyngeal phase: Secondary | ICD-10-CM | POA: Diagnosis not present

## 2019-07-09 DIAGNOSIS — I5020 Unspecified systolic (congestive) heart failure: Secondary | ICD-10-CM | POA: Diagnosis not present

## 2019-07-09 DIAGNOSIS — E559 Vitamin D deficiency, unspecified: Secondary | ICD-10-CM | POA: Diagnosis not present

## 2019-07-09 DIAGNOSIS — R2689 Other abnormalities of gait and mobility: Secondary | ICD-10-CM | POA: Diagnosis not present

## 2019-07-09 DIAGNOSIS — W19XXXA Unspecified fall, initial encounter: Secondary | ICD-10-CM | POA: Diagnosis not present

## 2019-07-09 DIAGNOSIS — R279 Unspecified lack of coordination: Secondary | ICD-10-CM | POA: Diagnosis not present

## 2019-07-09 DIAGNOSIS — M48061 Spinal stenosis, lumbar region without neurogenic claudication: Secondary | ICD-10-CM | POA: Diagnosis not present

## 2019-07-10 DIAGNOSIS — R1312 Dysphagia, oropharyngeal phase: Secondary | ICD-10-CM | POA: Diagnosis not present

## 2019-07-10 DIAGNOSIS — K219 Gastro-esophageal reflux disease without esophagitis: Secondary | ICD-10-CM | POA: Diagnosis not present

## 2019-07-10 DIAGNOSIS — I5020 Unspecified systolic (congestive) heart failure: Secondary | ICD-10-CM | POA: Diagnosis not present

## 2019-07-10 DIAGNOSIS — E559 Vitamin D deficiency, unspecified: Secondary | ICD-10-CM | POA: Diagnosis not present

## 2019-07-10 DIAGNOSIS — R2689 Other abnormalities of gait and mobility: Secondary | ICD-10-CM | POA: Diagnosis not present

## 2019-07-10 DIAGNOSIS — M48061 Spinal stenosis, lumbar region without neurogenic claudication: Secondary | ICD-10-CM | POA: Diagnosis not present

## 2019-07-10 DIAGNOSIS — R279 Unspecified lack of coordination: Secondary | ICD-10-CM | POA: Diagnosis not present

## 2019-07-10 DIAGNOSIS — I639 Cerebral infarction, unspecified: Secondary | ICD-10-CM | POA: Diagnosis not present

## 2019-07-10 DIAGNOSIS — M6281 Muscle weakness (generalized): Secondary | ICD-10-CM | POA: Diagnosis not present

## 2019-07-13 DIAGNOSIS — Z79899 Other long term (current) drug therapy: Secondary | ICD-10-CM | POA: Diagnosis not present

## 2019-07-14 DIAGNOSIS — Z79899 Other long term (current) drug therapy: Secondary | ICD-10-CM | POA: Diagnosis not present

## 2019-07-16 DIAGNOSIS — J449 Chronic obstructive pulmonary disease, unspecified: Secondary | ICD-10-CM | POA: Diagnosis not present

## 2019-07-27 DIAGNOSIS — K5900 Constipation, unspecified: Secondary | ICD-10-CM | POA: Diagnosis not present

## 2019-07-27 DIAGNOSIS — I5020 Unspecified systolic (congestive) heart failure: Secondary | ICD-10-CM | POA: Diagnosis not present

## 2019-07-27 DIAGNOSIS — R627 Adult failure to thrive: Secondary | ICD-10-CM | POA: Diagnosis not present

## 2019-08-14 DIAGNOSIS — R278 Other lack of coordination: Secondary | ICD-10-CM | POA: Diagnosis not present

## 2019-08-14 DIAGNOSIS — I10 Essential (primary) hypertension: Secondary | ICD-10-CM | POA: Diagnosis not present

## 2019-08-14 DIAGNOSIS — E559 Vitamin D deficiency, unspecified: Secondary | ICD-10-CM | POA: Diagnosis not present

## 2019-08-14 DIAGNOSIS — I5020 Unspecified systolic (congestive) heart failure: Secondary | ICD-10-CM | POA: Diagnosis not present

## 2019-08-14 DIAGNOSIS — M545 Low back pain: Secondary | ICD-10-CM | POA: Diagnosis not present

## 2019-08-14 DIAGNOSIS — I639 Cerebral infarction, unspecified: Secondary | ICD-10-CM | POA: Diagnosis not present

## 2019-08-14 DIAGNOSIS — M48061 Spinal stenosis, lumbar region without neurogenic claudication: Secondary | ICD-10-CM | POA: Diagnosis not present

## 2019-08-14 DIAGNOSIS — M6281 Muscle weakness (generalized): Secondary | ICD-10-CM | POA: Diagnosis not present

## 2019-08-17 DIAGNOSIS — E559 Vitamin D deficiency, unspecified: Secondary | ICD-10-CM | POA: Diagnosis not present

## 2019-08-17 DIAGNOSIS — M545 Low back pain: Secondary | ICD-10-CM | POA: Diagnosis not present

## 2019-08-17 DIAGNOSIS — M6281 Muscle weakness (generalized): Secondary | ICD-10-CM | POA: Diagnosis not present

## 2019-08-17 DIAGNOSIS — I5020 Unspecified systolic (congestive) heart failure: Secondary | ICD-10-CM | POA: Diagnosis not present

## 2019-08-17 DIAGNOSIS — R278 Other lack of coordination: Secondary | ICD-10-CM | POA: Diagnosis not present

## 2019-08-17 DIAGNOSIS — I639 Cerebral infarction, unspecified: Secondary | ICD-10-CM | POA: Diagnosis not present

## 2019-08-17 DIAGNOSIS — M48061 Spinal stenosis, lumbar region without neurogenic claudication: Secondary | ICD-10-CM | POA: Diagnosis not present

## 2019-08-17 DIAGNOSIS — I10 Essential (primary) hypertension: Secondary | ICD-10-CM | POA: Diagnosis not present

## 2019-08-18 DIAGNOSIS — I5020 Unspecified systolic (congestive) heart failure: Secondary | ICD-10-CM | POA: Diagnosis not present

## 2019-08-18 DIAGNOSIS — E559 Vitamin D deficiency, unspecified: Secondary | ICD-10-CM | POA: Diagnosis not present

## 2019-08-18 DIAGNOSIS — M6281 Muscle weakness (generalized): Secondary | ICD-10-CM | POA: Diagnosis not present

## 2019-08-18 DIAGNOSIS — M48061 Spinal stenosis, lumbar region without neurogenic claudication: Secondary | ICD-10-CM | POA: Diagnosis not present

## 2019-08-18 DIAGNOSIS — R278 Other lack of coordination: Secondary | ICD-10-CM | POA: Diagnosis not present

## 2019-08-18 DIAGNOSIS — I639 Cerebral infarction, unspecified: Secondary | ICD-10-CM | POA: Diagnosis not present

## 2019-08-18 DIAGNOSIS — M545 Low back pain: Secondary | ICD-10-CM | POA: Diagnosis not present

## 2019-08-18 DIAGNOSIS — I10 Essential (primary) hypertension: Secondary | ICD-10-CM | POA: Diagnosis not present

## 2019-08-19 DIAGNOSIS — E559 Vitamin D deficiency, unspecified: Secondary | ICD-10-CM | POA: Diagnosis not present

## 2019-08-19 DIAGNOSIS — M48061 Spinal stenosis, lumbar region without neurogenic claudication: Secondary | ICD-10-CM | POA: Diagnosis not present

## 2019-08-19 DIAGNOSIS — I5020 Unspecified systolic (congestive) heart failure: Secondary | ICD-10-CM | POA: Diagnosis not present

## 2019-08-19 DIAGNOSIS — I10 Essential (primary) hypertension: Secondary | ICD-10-CM | POA: Diagnosis not present

## 2019-08-19 DIAGNOSIS — R278 Other lack of coordination: Secondary | ICD-10-CM | POA: Diagnosis not present

## 2019-08-19 DIAGNOSIS — I639 Cerebral infarction, unspecified: Secondary | ICD-10-CM | POA: Diagnosis not present

## 2019-08-19 DIAGNOSIS — M6281 Muscle weakness (generalized): Secondary | ICD-10-CM | POA: Diagnosis not present

## 2019-08-19 DIAGNOSIS — M545 Low back pain: Secondary | ICD-10-CM | POA: Diagnosis not present

## 2019-08-20 DIAGNOSIS — I639 Cerebral infarction, unspecified: Secondary | ICD-10-CM | POA: Diagnosis not present

## 2019-08-20 DIAGNOSIS — I10 Essential (primary) hypertension: Secondary | ICD-10-CM | POA: Diagnosis not present

## 2019-08-20 DIAGNOSIS — R278 Other lack of coordination: Secondary | ICD-10-CM | POA: Diagnosis not present

## 2019-08-20 DIAGNOSIS — M6281 Muscle weakness (generalized): Secondary | ICD-10-CM | POA: Diagnosis not present

## 2019-08-20 DIAGNOSIS — M48061 Spinal stenosis, lumbar region without neurogenic claudication: Secondary | ICD-10-CM | POA: Diagnosis not present

## 2019-08-20 DIAGNOSIS — I5020 Unspecified systolic (congestive) heart failure: Secondary | ICD-10-CM | POA: Diagnosis not present

## 2019-08-20 DIAGNOSIS — M545 Low back pain: Secondary | ICD-10-CM | POA: Diagnosis not present

## 2019-08-20 DIAGNOSIS — E559 Vitamin D deficiency, unspecified: Secondary | ICD-10-CM | POA: Diagnosis not present

## 2019-08-21 DIAGNOSIS — M48061 Spinal stenosis, lumbar region without neurogenic claudication: Secondary | ICD-10-CM | POA: Diagnosis not present

## 2019-08-21 DIAGNOSIS — I10 Essential (primary) hypertension: Secondary | ICD-10-CM | POA: Diagnosis not present

## 2019-08-21 DIAGNOSIS — I5020 Unspecified systolic (congestive) heart failure: Secondary | ICD-10-CM | POA: Diagnosis not present

## 2019-08-21 DIAGNOSIS — E559 Vitamin D deficiency, unspecified: Secondary | ICD-10-CM | POA: Diagnosis not present

## 2019-08-21 DIAGNOSIS — I639 Cerebral infarction, unspecified: Secondary | ICD-10-CM | POA: Diagnosis not present

## 2019-08-21 DIAGNOSIS — M6281 Muscle weakness (generalized): Secondary | ICD-10-CM | POA: Diagnosis not present

## 2019-08-21 DIAGNOSIS — R278 Other lack of coordination: Secondary | ICD-10-CM | POA: Diagnosis not present

## 2019-08-21 DIAGNOSIS — M545 Low back pain: Secondary | ICD-10-CM | POA: Diagnosis not present

## 2019-08-23 DIAGNOSIS — M6281 Muscle weakness (generalized): Secondary | ICD-10-CM | POA: Diagnosis not present

## 2019-08-23 DIAGNOSIS — R278 Other lack of coordination: Secondary | ICD-10-CM | POA: Diagnosis not present

## 2019-08-23 DIAGNOSIS — M545 Low back pain: Secondary | ICD-10-CM | POA: Diagnosis not present

## 2019-08-23 DIAGNOSIS — E559 Vitamin D deficiency, unspecified: Secondary | ICD-10-CM | POA: Diagnosis not present

## 2019-08-23 DIAGNOSIS — I639 Cerebral infarction, unspecified: Secondary | ICD-10-CM | POA: Diagnosis not present

## 2019-08-23 DIAGNOSIS — I5020 Unspecified systolic (congestive) heart failure: Secondary | ICD-10-CM | POA: Diagnosis not present

## 2019-08-23 DIAGNOSIS — I10 Essential (primary) hypertension: Secondary | ICD-10-CM | POA: Diagnosis not present

## 2019-08-23 DIAGNOSIS — M48061 Spinal stenosis, lumbar region without neurogenic claudication: Secondary | ICD-10-CM | POA: Diagnosis not present

## 2019-08-24 DIAGNOSIS — R278 Other lack of coordination: Secondary | ICD-10-CM | POA: Diagnosis not present

## 2019-08-24 DIAGNOSIS — E559 Vitamin D deficiency, unspecified: Secondary | ICD-10-CM | POA: Diagnosis not present

## 2019-08-24 DIAGNOSIS — I5020 Unspecified systolic (congestive) heart failure: Secondary | ICD-10-CM | POA: Diagnosis not present

## 2019-08-24 DIAGNOSIS — I10 Essential (primary) hypertension: Secondary | ICD-10-CM | POA: Diagnosis not present

## 2019-08-24 DIAGNOSIS — M48061 Spinal stenosis, lumbar region without neurogenic claudication: Secondary | ICD-10-CM | POA: Diagnosis not present

## 2019-08-24 DIAGNOSIS — M6281 Muscle weakness (generalized): Secondary | ICD-10-CM | POA: Diagnosis not present

## 2019-08-24 DIAGNOSIS — I639 Cerebral infarction, unspecified: Secondary | ICD-10-CM | POA: Diagnosis not present

## 2019-08-24 DIAGNOSIS — M545 Low back pain: Secondary | ICD-10-CM | POA: Diagnosis not present

## 2019-08-25 DIAGNOSIS — I5020 Unspecified systolic (congestive) heart failure: Secondary | ICD-10-CM | POA: Diagnosis not present

## 2019-08-25 DIAGNOSIS — M545 Low back pain: Secondary | ICD-10-CM | POA: Diagnosis not present

## 2019-08-25 DIAGNOSIS — E559 Vitamin D deficiency, unspecified: Secondary | ICD-10-CM | POA: Diagnosis not present

## 2019-08-25 DIAGNOSIS — M48061 Spinal stenosis, lumbar region without neurogenic claudication: Secondary | ICD-10-CM | POA: Diagnosis not present

## 2019-08-25 DIAGNOSIS — I639 Cerebral infarction, unspecified: Secondary | ICD-10-CM | POA: Diagnosis not present

## 2019-08-25 DIAGNOSIS — M6281 Muscle weakness (generalized): Secondary | ICD-10-CM | POA: Diagnosis not present

## 2019-08-25 DIAGNOSIS — R278 Other lack of coordination: Secondary | ICD-10-CM | POA: Diagnosis not present

## 2019-08-25 DIAGNOSIS — I10 Essential (primary) hypertension: Secondary | ICD-10-CM | POA: Diagnosis not present

## 2019-08-27 DIAGNOSIS — M48061 Spinal stenosis, lumbar region without neurogenic claudication: Secondary | ICD-10-CM | POA: Diagnosis not present

## 2019-08-27 DIAGNOSIS — M6281 Muscle weakness (generalized): Secondary | ICD-10-CM | POA: Diagnosis not present

## 2019-08-27 DIAGNOSIS — E559 Vitamin D deficiency, unspecified: Secondary | ICD-10-CM | POA: Diagnosis not present

## 2019-08-27 DIAGNOSIS — R278 Other lack of coordination: Secondary | ICD-10-CM | POA: Diagnosis not present

## 2019-08-27 DIAGNOSIS — I5020 Unspecified systolic (congestive) heart failure: Secondary | ICD-10-CM | POA: Diagnosis not present

## 2019-08-27 DIAGNOSIS — M545 Low back pain: Secondary | ICD-10-CM | POA: Diagnosis not present

## 2019-08-27 DIAGNOSIS — I639 Cerebral infarction, unspecified: Secondary | ICD-10-CM | POA: Diagnosis not present

## 2019-08-27 DIAGNOSIS — I10 Essential (primary) hypertension: Secondary | ICD-10-CM | POA: Diagnosis not present

## 2019-08-28 DIAGNOSIS — I639 Cerebral infarction, unspecified: Secondary | ICD-10-CM | POA: Diagnosis not present

## 2019-08-28 DIAGNOSIS — M48061 Spinal stenosis, lumbar region without neurogenic claudication: Secondary | ICD-10-CM | POA: Diagnosis not present

## 2019-08-28 DIAGNOSIS — M6281 Muscle weakness (generalized): Secondary | ICD-10-CM | POA: Diagnosis not present

## 2019-08-28 DIAGNOSIS — R278 Other lack of coordination: Secondary | ICD-10-CM | POA: Diagnosis not present

## 2019-08-28 DIAGNOSIS — I5020 Unspecified systolic (congestive) heart failure: Secondary | ICD-10-CM | POA: Diagnosis not present

## 2019-08-28 DIAGNOSIS — I10 Essential (primary) hypertension: Secondary | ICD-10-CM | POA: Diagnosis not present

## 2019-08-28 DIAGNOSIS — E559 Vitamin D deficiency, unspecified: Secondary | ICD-10-CM | POA: Diagnosis not present

## 2019-08-28 DIAGNOSIS — M545 Low back pain: Secondary | ICD-10-CM | POA: Diagnosis not present

## 2019-09-01 DIAGNOSIS — I5020 Unspecified systolic (congestive) heart failure: Secondary | ICD-10-CM | POA: Diagnosis not present

## 2019-09-01 DIAGNOSIS — I10 Essential (primary) hypertension: Secondary | ICD-10-CM | POA: Diagnosis not present

## 2019-09-01 DIAGNOSIS — M545 Low back pain: Secondary | ICD-10-CM | POA: Diagnosis not present

## 2019-09-01 DIAGNOSIS — E559 Vitamin D deficiency, unspecified: Secondary | ICD-10-CM | POA: Diagnosis not present

## 2019-09-01 DIAGNOSIS — M48061 Spinal stenosis, lumbar region without neurogenic claudication: Secondary | ICD-10-CM | POA: Diagnosis not present

## 2019-09-01 DIAGNOSIS — M6281 Muscle weakness (generalized): Secondary | ICD-10-CM | POA: Diagnosis not present

## 2019-09-01 DIAGNOSIS — I639 Cerebral infarction, unspecified: Secondary | ICD-10-CM | POA: Diagnosis not present

## 2019-09-01 DIAGNOSIS — R278 Other lack of coordination: Secondary | ICD-10-CM | POA: Diagnosis not present

## 2019-09-02 DIAGNOSIS — I10 Essential (primary) hypertension: Secondary | ICD-10-CM | POA: Diagnosis not present

## 2019-09-02 DIAGNOSIS — M48061 Spinal stenosis, lumbar region without neurogenic claudication: Secondary | ICD-10-CM | POA: Diagnosis not present

## 2019-09-02 DIAGNOSIS — R278 Other lack of coordination: Secondary | ICD-10-CM | POA: Diagnosis not present

## 2019-09-02 DIAGNOSIS — I5020 Unspecified systolic (congestive) heart failure: Secondary | ICD-10-CM | POA: Diagnosis not present

## 2019-09-02 DIAGNOSIS — M545 Low back pain: Secondary | ICD-10-CM | POA: Diagnosis not present

## 2019-09-02 DIAGNOSIS — I639 Cerebral infarction, unspecified: Secondary | ICD-10-CM | POA: Diagnosis not present

## 2019-09-02 DIAGNOSIS — M6281 Muscle weakness (generalized): Secondary | ICD-10-CM | POA: Diagnosis not present

## 2019-09-02 DIAGNOSIS — E559 Vitamin D deficiency, unspecified: Secondary | ICD-10-CM | POA: Diagnosis not present

## 2019-09-03 DIAGNOSIS — M545 Low back pain: Secondary | ICD-10-CM | POA: Diagnosis not present

## 2019-09-03 DIAGNOSIS — M6281 Muscle weakness (generalized): Secondary | ICD-10-CM | POA: Diagnosis not present

## 2019-09-03 DIAGNOSIS — I639 Cerebral infarction, unspecified: Secondary | ICD-10-CM | POA: Diagnosis not present

## 2019-09-03 DIAGNOSIS — M48061 Spinal stenosis, lumbar region without neurogenic claudication: Secondary | ICD-10-CM | POA: Diagnosis not present

## 2019-09-03 DIAGNOSIS — E559 Vitamin D deficiency, unspecified: Secondary | ICD-10-CM | POA: Diagnosis not present

## 2019-09-03 DIAGNOSIS — R278 Other lack of coordination: Secondary | ICD-10-CM | POA: Diagnosis not present

## 2019-09-03 DIAGNOSIS — I10 Essential (primary) hypertension: Secondary | ICD-10-CM | POA: Diagnosis not present

## 2019-09-03 DIAGNOSIS — I5020 Unspecified systolic (congestive) heart failure: Secondary | ICD-10-CM | POA: Diagnosis not present

## 2019-09-04 DIAGNOSIS — I10 Essential (primary) hypertension: Secondary | ICD-10-CM | POA: Diagnosis not present

## 2019-09-04 DIAGNOSIS — E559 Vitamin D deficiency, unspecified: Secondary | ICD-10-CM | POA: Diagnosis not present

## 2019-09-04 DIAGNOSIS — M545 Low back pain: Secondary | ICD-10-CM | POA: Diagnosis not present

## 2019-09-04 DIAGNOSIS — R278 Other lack of coordination: Secondary | ICD-10-CM | POA: Diagnosis not present

## 2019-09-04 DIAGNOSIS — I5020 Unspecified systolic (congestive) heart failure: Secondary | ICD-10-CM | POA: Diagnosis not present

## 2019-09-04 DIAGNOSIS — M6281 Muscle weakness (generalized): Secondary | ICD-10-CM | POA: Diagnosis not present

## 2019-09-04 DIAGNOSIS — M48061 Spinal stenosis, lumbar region without neurogenic claudication: Secondary | ICD-10-CM | POA: Diagnosis not present

## 2019-09-04 DIAGNOSIS — I639 Cerebral infarction, unspecified: Secondary | ICD-10-CM | POA: Diagnosis not present

## 2019-09-07 DIAGNOSIS — I10 Essential (primary) hypertension: Secondary | ICD-10-CM | POA: Diagnosis not present

## 2019-09-07 DIAGNOSIS — M6281 Muscle weakness (generalized): Secondary | ICD-10-CM | POA: Diagnosis not present

## 2019-09-07 DIAGNOSIS — E559 Vitamin D deficiency, unspecified: Secondary | ICD-10-CM | POA: Diagnosis not present

## 2019-09-07 DIAGNOSIS — I639 Cerebral infarction, unspecified: Secondary | ICD-10-CM | POA: Diagnosis not present

## 2019-09-07 DIAGNOSIS — M545 Low back pain: Secondary | ICD-10-CM | POA: Diagnosis not present

## 2019-09-07 DIAGNOSIS — M48061 Spinal stenosis, lumbar region without neurogenic claudication: Secondary | ICD-10-CM | POA: Diagnosis not present

## 2019-09-07 DIAGNOSIS — R278 Other lack of coordination: Secondary | ICD-10-CM | POA: Diagnosis not present

## 2019-09-07 DIAGNOSIS — I5020 Unspecified systolic (congestive) heart failure: Secondary | ICD-10-CM | POA: Diagnosis not present

## 2019-09-08 DIAGNOSIS — I5020 Unspecified systolic (congestive) heart failure: Secondary | ICD-10-CM | POA: Diagnosis not present

## 2019-09-08 DIAGNOSIS — R278 Other lack of coordination: Secondary | ICD-10-CM | POA: Diagnosis not present

## 2019-09-08 DIAGNOSIS — I10 Essential (primary) hypertension: Secondary | ICD-10-CM | POA: Diagnosis not present

## 2019-09-08 DIAGNOSIS — M48061 Spinal stenosis, lumbar region without neurogenic claudication: Secondary | ICD-10-CM | POA: Diagnosis not present

## 2019-09-08 DIAGNOSIS — M545 Low back pain: Secondary | ICD-10-CM | POA: Diagnosis not present

## 2019-09-08 DIAGNOSIS — M6281 Muscle weakness (generalized): Secondary | ICD-10-CM | POA: Diagnosis not present

## 2019-09-08 DIAGNOSIS — E559 Vitamin D deficiency, unspecified: Secondary | ICD-10-CM | POA: Diagnosis not present

## 2019-09-08 DIAGNOSIS — I639 Cerebral infarction, unspecified: Secondary | ICD-10-CM | POA: Diagnosis not present

## 2019-09-09 DIAGNOSIS — I639 Cerebral infarction, unspecified: Secondary | ICD-10-CM | POA: Diagnosis not present

## 2019-09-09 DIAGNOSIS — R278 Other lack of coordination: Secondary | ICD-10-CM | POA: Diagnosis not present

## 2019-09-09 DIAGNOSIS — I10 Essential (primary) hypertension: Secondary | ICD-10-CM | POA: Diagnosis not present

## 2019-09-09 DIAGNOSIS — M545 Low back pain: Secondary | ICD-10-CM | POA: Diagnosis not present

## 2019-09-09 DIAGNOSIS — I5020 Unspecified systolic (congestive) heart failure: Secondary | ICD-10-CM | POA: Diagnosis not present

## 2019-09-09 DIAGNOSIS — M48061 Spinal stenosis, lumbar region without neurogenic claudication: Secondary | ICD-10-CM | POA: Diagnosis not present

## 2019-09-09 DIAGNOSIS — E559 Vitamin D deficiency, unspecified: Secondary | ICD-10-CM | POA: Diagnosis not present

## 2019-09-09 DIAGNOSIS — M6281 Muscle weakness (generalized): Secondary | ICD-10-CM | POA: Diagnosis not present
